# Patient Record
Sex: Male | Born: 1940
Health system: Southern US, Community
[De-identification: ages and names within clinical notes are randomized; demographics above are authoritative.]

## PROBLEM LIST (undated history)

## (undated) ENCOUNTER — Emergency Department (HOSPITAL_COMMUNITY): Discharge status: Absent without leave | Payer: Medicare Other

## (undated) DIAGNOSIS — H34239 Retinal artery branch occlusion, unspecified eye: Secondary | ICD-10-CM

## (undated) DIAGNOSIS — I4819 Other persistent atrial fibrillation: Secondary | ICD-10-CM

## (undated) DIAGNOSIS — C3490 Malignant neoplasm of unspecified part of unspecified bronchus or lung: Secondary | ICD-10-CM

## (undated) DIAGNOSIS — M1990 Unspecified osteoarthritis, unspecified site: Secondary | ICD-10-CM

## (undated) DIAGNOSIS — D126 Benign neoplasm of colon, unspecified: Secondary | ICD-10-CM

## (undated) DIAGNOSIS — I639 Cerebral infarction, unspecified: Secondary | ICD-10-CM

## (undated) DIAGNOSIS — I10 Essential (primary) hypertension: Secondary | ICD-10-CM

## (undated) DIAGNOSIS — R002 Palpitations: Secondary | ICD-10-CM

## (undated) DIAGNOSIS — K648 Other hemorrhoids: Secondary | ICD-10-CM

## (undated) DIAGNOSIS — K219 Gastro-esophageal reflux disease without esophagitis: Secondary | ICD-10-CM

## (undated) DIAGNOSIS — J45909 Unspecified asthma, uncomplicated: Secondary | ICD-10-CM

## (undated) DIAGNOSIS — K222 Esophageal obstruction: Secondary | ICD-10-CM

## (undated) DIAGNOSIS — E7800 Pure hypercholesterolemia, unspecified: Secondary | ICD-10-CM

## (undated) DIAGNOSIS — K449 Diaphragmatic hernia without obstruction or gangrene: Secondary | ICD-10-CM

## (undated) DIAGNOSIS — T7840XA Allergy, unspecified, initial encounter: Secondary | ICD-10-CM

## (undated) DIAGNOSIS — C4490 Unspecified malignant neoplasm of skin, unspecified: Secondary | ICD-10-CM

## (undated) DIAGNOSIS — Z950 Presence of cardiac pacemaker: Secondary | ICD-10-CM

## (undated) DIAGNOSIS — J40 Bronchitis, not specified as acute or chronic: Secondary | ICD-10-CM

## (undated) DIAGNOSIS — I441 Atrioventricular block, second degree: Secondary | ICD-10-CM

## (undated) HISTORY — DX: Hypercalcemia: E83.52

## (undated) HISTORY — PX: ESOPHAGOGASTRODUODENOSCOPY (EGD) WITH ESOPHAGEAL DILATION: SHX5812

## (undated) HISTORY — PX: KNEE ARTHROSCOPY: SHX127

## (undated) HISTORY — DX: Esophageal obstruction: K22.2

## (undated) HISTORY — DX: Malignant neoplasm of unspecified part of unspecified bronchus or lung: C34.90

## (undated) HISTORY — PX: MOHS SURGERY: SUR867

## (undated) HISTORY — PX: COLONOSCOPY: SHX174

## (undated) HISTORY — DX: Unspecified osteoarthritis, unspecified site: M19.90

## (undated) HISTORY — DX: Essential (primary) hypertension: I10

## (undated) HISTORY — DX: Other hemorrhoids: K64.8

## (undated) HISTORY — DX: Unspecified malignant neoplasm of skin, unspecified: C44.90

## (undated) HISTORY — DX: Hemochromatosis, unspecified: E83.119

## (undated) HISTORY — PX: UPPER GASTROINTESTINAL ENDOSCOPY: SHX188

## (undated) HISTORY — PX: INGUINAL HERNIA REPAIR: SUR1180

## (undated) HISTORY — DX: Unspecified asthma, uncomplicated: J45.909

## (undated) HISTORY — DX: Pure hypercholesterolemia, unspecified: E78.00

## (undated) HISTORY — DX: Diaphragmatic hernia without obstruction or gangrene: K44.9

## (undated) HISTORY — DX: Bronchitis, not specified as acute or chronic: J40

## (undated) HISTORY — DX: Allergy, unspecified, initial encounter: T78.40XA

## (undated) HISTORY — DX: Other persistent atrial fibrillation: I48.19

## (undated) HISTORY — DX: Palpitations: R00.2

## (undated) HISTORY — DX: Benign neoplasm of colon, unspecified: D12.6

---
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
## 1998-04-04 ENCOUNTER — Ambulatory Visit: Admission: RE | Admit: 1998-04-04 | Discharge: 1998-04-04 | Payer: Self-pay | Admitting: Internal Medicine

## 1998-05-02 ENCOUNTER — Encounter (HOSPITAL_COMMUNITY): Admission: RE | Admit: 1998-05-02 | Discharge: 1998-07-31 | Payer: Self-pay | Admitting: Internal Medicine

## 1998-10-07 ENCOUNTER — Encounter (HOSPITAL_COMMUNITY): Admission: RE | Admit: 1998-10-07 | Discharge: 1999-01-05 | Payer: Self-pay | Admitting: Internal Medicine

## 1999-03-04 ENCOUNTER — Encounter (HOSPITAL_COMMUNITY): Admission: RE | Admit: 1999-03-04 | Discharge: 1999-06-02 | Payer: Self-pay | Admitting: Internal Medicine

## 1999-06-03 ENCOUNTER — Encounter (HOSPITAL_COMMUNITY): Admission: RE | Admit: 1999-06-03 | Discharge: 1999-09-01 | Payer: Self-pay | Admitting: Internal Medicine

## 1999-10-23 ENCOUNTER — Encounter (HOSPITAL_COMMUNITY): Admission: RE | Admit: 1999-10-23 | Discharge: 2000-01-21 | Payer: Self-pay | Admitting: Internal Medicine

## 2003-04-21 ENCOUNTER — Emergency Department (HOSPITAL_COMMUNITY): Admission: EM | Admit: 2003-04-21 | Discharge: 2003-04-21 | Payer: Self-pay | Admitting: Emergency Medicine

## 2005-05-28 ENCOUNTER — Ambulatory Visit: Payer: Self-pay | Admitting: Gastroenterology

## 2005-06-17 ENCOUNTER — Ambulatory Visit: Payer: Self-pay | Admitting: Gastroenterology

## 2005-06-19 ENCOUNTER — Encounter (INDEPENDENT_AMBULATORY_CARE_PROVIDER_SITE_OTHER): Payer: Self-pay | Admitting: *Deleted

## 2005-06-19 ENCOUNTER — Ambulatory Visit: Payer: Self-pay | Admitting: Gastroenterology

## 2005-06-19 DIAGNOSIS — D126 Benign neoplasm of colon, unspecified: Secondary | ICD-10-CM | POA: Insufficient documentation

## 2005-07-21 ENCOUNTER — Ambulatory Visit: Payer: Self-pay | Admitting: Gastroenterology

## 2005-09-02 ENCOUNTER — Encounter (INDEPENDENT_AMBULATORY_CARE_PROVIDER_SITE_OTHER): Payer: Self-pay | Admitting: *Deleted

## 2005-09-02 ENCOUNTER — Ambulatory Visit (HOSPITAL_BASED_OUTPATIENT_CLINIC_OR_DEPARTMENT_OTHER): Admission: RE | Admit: 2005-09-02 | Discharge: 2005-09-02 | Payer: Self-pay | Admitting: Orthopedic Surgery

## 2007-06-14 ENCOUNTER — Encounter: Payer: Self-pay | Admitting: Internal Medicine

## 2007-06-14 ENCOUNTER — Ambulatory Visit (HOSPITAL_COMMUNITY): Admission: EM | Admit: 2007-06-14 | Discharge: 2007-06-15 | Payer: Self-pay | Admitting: Emergency Medicine

## 2007-06-14 DIAGNOSIS — K222 Esophageal obstruction: Secondary | ICD-10-CM | POA: Insufficient documentation

## 2007-06-23 ENCOUNTER — Ambulatory Visit: Payer: Self-pay | Admitting: Internal Medicine

## 2007-07-08 ENCOUNTER — Ambulatory Visit: Payer: Self-pay | Admitting: Gastroenterology

## 2007-08-20 DIAGNOSIS — IMO0002 Reserved for concepts with insufficient information to code with codable children: Secondary | ICD-10-CM

## 2007-08-20 DIAGNOSIS — M1710 Unilateral primary osteoarthritis, unspecified knee: Secondary | ICD-10-CM | POA: Insufficient documentation

## 2007-08-20 DIAGNOSIS — E7800 Pure hypercholesterolemia, unspecified: Secondary | ICD-10-CM

## 2007-08-20 DIAGNOSIS — N200 Calculus of kidney: Secondary | ICD-10-CM | POA: Insufficient documentation

## 2007-08-20 DIAGNOSIS — I10 Essential (primary) hypertension: Secondary | ICD-10-CM | POA: Insufficient documentation

## 2007-08-20 DIAGNOSIS — R001 Bradycardia, unspecified: Secondary | ICD-10-CM | POA: Insufficient documentation

## 2007-08-20 DIAGNOSIS — D509 Iron deficiency anemia, unspecified: Secondary | ICD-10-CM | POA: Insufficient documentation

## 2008-07-02 ENCOUNTER — Telehealth: Payer: Self-pay | Admitting: Gastroenterology

## 2008-07-04 ENCOUNTER — Encounter: Payer: Self-pay | Admitting: Gastroenterology

## 2008-08-06 ENCOUNTER — Ambulatory Visit: Payer: Self-pay | Admitting: Gastroenterology

## 2008-08-06 ENCOUNTER — Encounter: Payer: Self-pay | Admitting: Gastroenterology

## 2008-08-08 ENCOUNTER — Encounter: Payer: Self-pay | Admitting: Gastroenterology

## 2009-02-25 ENCOUNTER — Ambulatory Visit: Payer: Self-pay | Admitting: Surgery

## 2010-08-03 HISTORY — PX: POSTERIOR TIBIAL TENDON REPAIR: SHX6039

## 2010-08-05 ENCOUNTER — Encounter
Admission: RE | Admit: 2010-08-05 | Discharge: 2010-08-05 | Payer: Self-pay | Source: Home / Self Care | Attending: Internal Medicine | Admitting: Internal Medicine

## 2010-09-02 NOTE — Letter (Signed)
Summary: Generic Letter  [facility]  [address] [place], [place] [address]   Phone: [phone]  Fax: [phone]    07/04/2008  GUISEPPE FLANAGAN [address] 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 If you are interested in   scheduling the procedures still please give our office a call me at,   [phone].       Sincerely,   Harlow Mares [facility]

## 2010-09-02 NOTE — Letter (Signed)
Summary: Patient Notice-Endo Biopsy Results  [facility]  [address] [place], [place] [address]   Phone: [phone]  Fax: [phone]        August 08, 2008 MRN: [id number]    Alexander Murray [address] [place], [place]  [address]    Dear Alexander Murray,  I am pleased to inform you that the biopsies taken during your recent endoscopic examination did not show any evidence of cancer upon pathologic examination.The biopsies did not show Barrett's mucosa, and this stricture in the cervical esophagus is benign and we will dilate as needed....DRP  Additional information/recommendations:  __No further action is needed at this time.  Please follow-up with      your primary care physician for your other healthcare needs.  __ Please call [phone] to schedule a return visit to review      your condition.  _xx_ Continue with the treatment plan as outlined on the day of your      exam.  __ You should have a repeat endoscopic examination for thi_s problem              in _ months/years.   Please call us if you are having persistent problems or have questions about your condition that have not been fully answered at this time.  Sincerely,  Mardella Layman MD [facility]  This letter has been electronically signed by your physician.

## 2010-09-02 NOTE — Progress Notes (Signed)
Summary: sch appt.  Phone Note Outgoing Call   Call placed by: Harlow Mares CMA,  July 02, 2008 12:57 PM Summary of Call: needs colon/egd Initial call taken by: Harlow Mares CMA,  July 02, 2008 12:58 PM  Follow-up for Phone Call        Left message for patient to call. Hortense Ramal CMA  July 03, 2008 10:18 AM Left message on patients machine to call back, to schedule egd and colon and if he would like to have the procedures.  also mailed letter Follow-up by: Harlow Mares CMA,  July 04, 2008 12:21 PM      Appended Document: Prep for Colon    Clinical Lists Changes  Medications: Added new medication of MOVIPREP 100 GM  SOLR (PEG-KCL-NACL-NASULF-NA ASC-C) As per prep instructions. - Signed Rx of MOVIPREP 100 GM  SOLR (PEG-KCL-NACL-NASULF-NA ASC-C) As per prep instructions.;  #1 x 0;  Signed;  Entered by: Harlow Mares CMA;  Authorized by: Mardella Layman MD [facility];  Method used: Electronically to [facility] [address] [id number]*, [address] [place], [place], [place]  [address], Ph: [phone] or [phone], Fax: [phone]    Prescriptions: MOVIPREP 100 GM  SOLR (PEG-KCL-NACL-NASULF-NA ASC-C) As per prep instructions.  #1 x 0   Entered by:   Harlow Mares CMA   Authorized by:   Mardella Layman MD FACG,FAGA   Signed by:   Harlow Mares CMA on 07/13/2008   Method used:   Electronically to        [facility] [address] [phone]* (retail)       [address]       [place], [place]  [address]       Ph: [phone] or [phone]       Fax: [phone]   RxID:   [id number]

## 2010-09-02 NOTE — Procedures (Signed)
Summary: Gastroenterology colon  Gastroenterology colon   Imported By: Lorre Munroe RN 08/22/2007 15:55:23  _____________________________________________________________________  External Attachment:    Type:   Image     Comment:   External Document

## 2010-09-02 NOTE — Procedures (Signed)
Summary: Gastroenterology EGD  Gastroenterology EGD   Imported By: Lorre Munroe RN 08/22/2007 15:56:21  _____________________________________________________________________  External Attachment:    Type:   Image     Comment:   External Document

## 2010-09-02 NOTE — Procedures (Signed)
Summary: EGD   EGD  Procedure date:  08/06/2008  Findings:      Location: [facility]    ENDOSCOPY PROCEDURE REPORT  PATIENT:  Alexander, Murray  MR#:  [id number] BIRTHDATE:   November 10, 1940   GENDER:   male  ENDOSCOPIST:   Vania Rea. Jarold Motto, MD, [facility] Graham ASSISTANT:    PROCEDURE DATE:  08/06/2008 PROCEDURE:  EGD with biopsy, EGD with balloon dilatation ASA CLASS:   Class II INDICATIONS: 1) dysphagia   MEDICATIONS:    Fentanyl 50 mcg IV, Versed 2 mg IV TOPICAL ANESTHETIC:   Exactacain Spray  DESCRIPTION OF PROCEDURE:   After the risks benefits and alternatives of the procedure were thoroughly explained, informed consent was obtained.  The LB GIF-H180 [id number] endoscope was introduced through the mouth and advanced to the second portion of the duodenum, without limitations.  The instrument was slowly withdrawn as the mucosa was carefully examined. <<PROCEDUREIMAGES>>          <<OLD IMAGES>>  A stricture was found in the proximal esophagus. Balloon dilatation over a guidewire was performed.  Barrett's esophagus was found in the proximal esophagus. BIOPSIES DONE    Dilation was then performed at the proximal esophagus  1) Dilator:   Balloon   Size(s):   Resistance:   minimal   Heme:   yes Appearance:   adequate   COMPLICATIONS:   None  ENDOSCOPIC IMPRESSION:  1) Stricture in the proximal esophagus  2) Barrett's esophagus in the proximal esophagus RECOMMENDATIONS:  1) await pathology results  2) Repeat dilation as needed.  REPEAT EXAM:   No   _______________________________ Vania Rea. Jarold Motto, MD, Clement[facility] Graham   CC: Jarome Matin, M.D.       REPORT OF SURGICAL PATHOLOGY   Case #: [id number] Patient Name: Alexander Murray, BRIGHTBILL MD Office Chart Number:  [id number]   MRN: [id number] Pathologist: Beulah Gandy. Luisa Hart, MD DOB/Age  Aug 22, 1940 (Age: 70)    Gender: M Date Taken:  08/06/2008 Date Received: 08/07/2008   FINAL DIAGNOSIS   ***MICROSCOPIC EXAMINATION AND  DIAGNOSIS***   ESOPHAGUS, BIOPSIES:   - BENIGN INFLAMED GASTROESOPHAGEAL JUNCTION MUCOSA.   - NO INTESTINAL METAPLASIA, DYSPLASIA OR MALIGNANCY IDENTIFIED.    COMMENT An Alcian Blue stain is performed to determine the presence of intestinal metaplasia (goblet cell metaplasia). No intestinal metaplasia (goblet cell metaplasia) is identified with the Alcian Blue stain. The control stained appropriately.   cc Date Reported:  08/08/2008     Beulah Gandy. Luisa Hart, MD *** Electronically Signed Out By JDP ***  August 08, 2008 MRN: [id number]    [facility] [address] [place], [place]  [address]    Dear Alexander Murray,  I am pleased to inform you that the biopsies taken during your recent endoscopic examination did not show any evidence of cancer upon pathologic examination.The biopsies did not show Barrett's mucosa, and this stricture in the cervical esophagus is benign and we will dilate as needed....DRP  Additional information/recommendations:  __No further action is needed at this time.  Please follow-up with      your primary care physician for your other healthcare needs.  __ Please call [phone] to schedule a return visit to review      your condition.  _xx_ Continue with the treatment plan as outlined on the day of your      exam.  __ You should have a repeat endoscopic examination for thi_s problem              in _ months/years.  Please call us if you are having persistent problems or have questions about your condition that have not been fully answered at this time.  Sincerely,  Mardella Layman MD [facility]  This letter has been electronically signed by your physician.

## 2010-09-02 NOTE — Procedures (Signed)
Summary: Colonoscopy   Colonoscopy  Procedure date:  08/06/2008  Findings:      Location:  [facility].    Procedures Next Due Date:    Colonoscopy: 08/2013  COLONOSCOPY PROCEDURE REPORT  PATIENT:  Alexander Murray, Alexander Murray  MR#:  [id number] BIRTHDATE:   1940-08-10   GENDER:   male  ENDOSCOPIST:   Vania Rea. Jarold Motto, MD, [facility] Referred by: Jarome Matin, M.D.  PROCEDURE DATE:  08/06/2008 PROCEDURE:  Colonoscopy with snare polypectomy ASA CLASS:   Class II INDICATIONS: surveillance, history of polyps, family history of colon cancer BOTH PARENTS WITH COLON CA.  MEDICATIONS:    Fentanyl 50 mcg IV, Versed 8 mg IV  DESCRIPTION OF PROCEDURE:   After the risks benefits and alternatives of the procedure were thoroughly explained, informed consent was obtained.  Digital rectal exam was performed and revealed no abnormalities.   The LB CF-H180AL [id number] endoscope was introduced through the anus and advanced to the cecum, which was identified by both the appendix and ileocecal valve, without limitations.  The quality of the prep was excellent, using MoviPrep.  The instrument was then slowly withdrawn as the colon was fully examined. <<PROCEDUREIMAGES>>      <<OLD IMAGES>>  FINDINGS:  There were multiple polyps identified and removed. transverse to sigmoid They were diminutive. Polyps were snared without cautery. Retrieval was unsuccessful. snare polyp SMALL DIMINUTIVE POLYPS COLD SNAE EXCISED.  Mild diverticulosis was found sigmoid to descending   Retroflexed views in the rectum revealed no abnormalities.    The scope was then withdrawn from the patient and the procedure completed.  COMPLICATIONS:   None  ENDOSCOPIC IMPRESSION:  1) Polyps, multiple in the transverse to sigmoid  2) Mild diverticulosis in the sigmoid to descending  3) Normal colonoscopy otherwise RECOMMENDATIONS:  1) If the polyp(s) removed today are proven to be adenomatous (pre-cancerous) polyps, the patient  will need a colonoscopy in 5 years. Otherwise they should continue to follow colorectal cancer screening guidelines for "routine risk" patients with colonoscopy in 10 years.  REPEAT EXAM:   No   _______________________________ Vania Rea. Jarold Motto, MD, Clement[facility] Graham  CC: Jarome Matin, MD     This report was created from the original endoscopy report, which was reviewed and signed by the above listed endoscopist.

## 2010-12-16 NOTE — Procedures (Signed)
CAROTID DUPLEX EXAM   INDICATION:  Partial right retinal occlusion.   HISTORY:  Diabetes:  No  Cardiac:  PVC  Hypertension:  Yes  Smoking:  Quit about 40 years ago.  Please see note from below.  Previous Surgery:  CV History:  Amaurosis Fugax, Paresthesias, Hemiparesis                                       RIGHT             LEFT  Brachial systolic pressure:         Around 140        128  Brachial Doppler waveforms:         Biphasic          Biphasic  Vertebral direction of flow:        Antegrade         Antegrade  DUPLEX VELOCITIES (cm/sec)  CCA peak systolic                   89                101  ECA peak systolic                   131               90  ICA peak systolic                   91                75  ICA end diastolic                   21                17  PLAQUE MORPHOLOGY:                    HETERO          HETERO  PLAQUE AMOUNT:                      Mild              Mild  PLAQUE LOCATION:                    CCA, ICA and ECA  CCA, ICA and ECA   IMPRESSION:  A 1-39% stenosis noted in bilateral ICAs.   Antegrade bilateral vertebral arteries.   ___________________________________________  V. Charlena Cross, MD   MG/MEDQ  D:  02/25/2009  T:  02/26/2009  Job:  [id number]

## 2010-12-16 NOTE — Assessment & Plan Note (Signed)
 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  He  is followed by Dr. Dossie Arbour and recently had blood work, which he  will forward to [place].   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  Lina Sar.  He has done well since that time and denies any  gastrointestinal complaints.  He has no chronic GERD symptoms or other  GI issues.  He does have a long history of recurrent colon polyps and is  due for followup colonoscopy in November.   Vital signs today are all normal.  He weighs 181 pounds and blood  pressure 112/70.  Pulse was slightly low at 46 and the patient is on  Cardura 4 mg twice a day, per Dr. Eloise Harman.   General physical exam was not performed.   ASSESSMENT:  1. Cervical web with recurrent dysphagia.  He is asymptomatic at this      point.  2. Chronic recurrent colon polyps with family history of colon cancer.  3. Bradycardia with a pulse rate of 46, but he is asymptomatic.   RECOMMENDATIONS:  1. Review lab data from Dr. Jarome Matin.  2. He is due for colonoscopy followup in November of 2009.  We will      proceed with endoscopy and biopsy of his cervical web at that time.  3. Letter to Dr. Eloise Harman concerning his bradycardia.     Vania Rea. Jarold Motto, MD, [facility] Essex, FAGA  Electronically Signed    DRP/MedQ  DD: 07/08/2007  DT: 07/08/2007  Job #: [id number]

## 2010-12-19 NOTE — Op Note (Signed)
NAME:  Alexander Murray, Alexander Murray              ACCOUNT NO.:  [id number]   MEDICAL RECORD NO.:  [id number]          PATIENT TYPE:  AMB   LOCATION:  [facility]                          FACILITY:  [facility]   PHYSICIAN:  Leonides Grills, M.D.     DATE OF BIRTH:  30-Aug-1940   DATE OF PROCEDURE:  09/02/2005  DATE OF DISCHARGE:                                 OPERATIVE REPORT   PREOPERATIVE DIAGNOSIS:  Right third web space Morton's neuroma.   POSTOPERATIVE DIAGNOSIS:  Right third web space Morton's neuroma.   OPERATION PERFORMED:  Excision of right third web space, Morton's neuroma.   SURGEON:  Leonides Grills, M.D.   ASSISTANT:  Lianne Cure, P.A.   ANESTHESIA:  General with ankle block.   ESTIMATED BLOOD LOSS:  Minimal.   TOURNIQUET TIME:  Approximately 20 minutes.   COMPLICATIONS:  None.   DISPOSITION:  Stable to PR.   INDICATIONS FOR PROCEDURE:  The patient is a 70 year old gentleman who has  had longstanding right forefoot pain that was interfering with his life to  the point where he cannot do what he wants to do.  He has consented for the  above procedure.  All risks which include infection, neurovascular injury,  persistent pain, worsening pain, prolonged recovery, hematoma formation were  all explained, questions were encouraged and answered.   DESCRIPTION OF PROCEDURE:  The patient was brought to the operating room and  placed in supine position after adequate general anesthesia with ankle block  was administered as well as Ancef 1 g IV piggyback.  The right lower  extremity was then prepped and draped in sterile manner over a proximally  placed thigh tourniquet.  The limb was gravity exsanguinated.  Tourniquet  was elevated to 290 mmHg.  Longitudinal incision over the dorsal aspect of  the right third web space forefoot was then made.  Dissection was carried  down through skin and hemostasis was obtained.  Dissection was carried down  between the third and fourth metatarsal heads.  The  metatarsal head spreader  was applied.  The intermetatarsal ligament was then transected and the  neuroma was then identified.  This was then dissected out, transected  distally and dissected proximally into the muscular part of the foot and cut  approximately 2 cm proximal to the metatarsal head in the muscular part of  the foot.  This was sent to pathology.  Tourniquet was  deflated, hemostasis was obtained.  The area was then copiously irrigated  with normal saline.  Subcutaneous was closed with 3-0 Vicryl.  The skin was  closed with 4-0 nylon.  Sterile dressing was applied.  Hard sole shoe was  applied.  The patient was stable to the PR.      Leonides Grills, M.D.  Electronically Signed     PB/MEDQ  D:  09/02/2005  T:  09/02/2005  Job:  [id number]

## 2011-08-06 DIAGNOSIS — Z1212 Encounter for screening for malignant neoplasm of rectum: Secondary | ICD-10-CM | POA: Diagnosis not present

## 2011-08-06 DIAGNOSIS — Z0000 Encounter for general adult medical examination without abnormal findings: Secondary | ICD-10-CM | POA: Diagnosis not present

## 2011-08-06 DIAGNOSIS — Z125 Encounter for screening for malignant neoplasm of prostate: Secondary | ICD-10-CM | POA: Diagnosis not present

## 2011-08-06 DIAGNOSIS — E782 Mixed hyperlipidemia: Secondary | ICD-10-CM | POA: Diagnosis not present

## 2011-08-06 DIAGNOSIS — I10 Essential (primary) hypertension: Secondary | ICD-10-CM | POA: Diagnosis not present

## 2011-08-14 DIAGNOSIS — M2140 Flat foot [pes planus] (acquired), unspecified foot: Secondary | ICD-10-CM | POA: Diagnosis not present

## 2011-08-14 DIAGNOSIS — M25579 Pain in unspecified ankle and joints of unspecified foot: Secondary | ICD-10-CM | POA: Diagnosis not present

## 2011-08-17 ENCOUNTER — Other Ambulatory Visit: Payer: Self-pay | Admitting: Dermatology

## 2011-08-17 DIAGNOSIS — C44319 Basal cell carcinoma of skin of other parts of face: Secondary | ICD-10-CM | POA: Diagnosis not present

## 2011-08-17 DIAGNOSIS — C44320 Squamous cell carcinoma of skin of unspecified parts of face: Secondary | ICD-10-CM | POA: Diagnosis not present

## 2011-09-28 DIAGNOSIS — C44320 Squamous cell carcinoma of skin of unspecified parts of face: Secondary | ICD-10-CM | POA: Diagnosis not present

## 2011-10-09 DIAGNOSIS — Z01818 Encounter for other preprocedural examination: Secondary | ICD-10-CM | POA: Diagnosis not present

## 2011-10-09 DIAGNOSIS — Z79899 Other long term (current) drug therapy: Secondary | ICD-10-CM | POA: Diagnosis not present

## 2011-10-19 DIAGNOSIS — M25579 Pain in unspecified ankle and joints of unspecified foot: Secondary | ICD-10-CM | POA: Diagnosis not present

## 2011-10-19 DIAGNOSIS — Z87891 Personal history of nicotine dependence: Secondary | ICD-10-CM | POA: Diagnosis not present

## 2011-10-19 DIAGNOSIS — G8918 Other acute postprocedural pain: Secondary | ICD-10-CM | POA: Diagnosis not present

## 2011-10-19 DIAGNOSIS — M2140 Flat foot [pes planus] (acquired), unspecified foot: Secondary | ICD-10-CM | POA: Diagnosis not present

## 2011-10-19 DIAGNOSIS — E7800 Pure hypercholesterolemia, unspecified: Secondary | ICD-10-CM | POA: Diagnosis not present

## 2011-10-19 DIAGNOSIS — M1990 Unspecified osteoarthritis, unspecified site: Secondary | ICD-10-CM | POA: Diagnosis not present

## 2011-10-19 DIAGNOSIS — M719 Bursopathy, unspecified: Secondary | ICD-10-CM | POA: Diagnosis not present

## 2011-10-19 DIAGNOSIS — Z79899 Other long term (current) drug therapy: Secondary | ICD-10-CM | POA: Diagnosis not present

## 2011-10-19 DIAGNOSIS — M6790 Unspecified disorder of synovium and tendon, unspecified site: Secondary | ICD-10-CM | POA: Diagnosis not present

## 2011-10-19 DIAGNOSIS — M76829 Posterior tibial tendinitis, unspecified leg: Secondary | ICD-10-CM | POA: Diagnosis not present

## 2011-10-19 DIAGNOSIS — I10 Essential (primary) hypertension: Secondary | ICD-10-CM | POA: Diagnosis not present

## 2011-10-20 DIAGNOSIS — M6790 Unspecified disorder of synovium and tendon, unspecified site: Secondary | ICD-10-CM | POA: Diagnosis not present

## 2011-10-20 DIAGNOSIS — M1990 Unspecified osteoarthritis, unspecified site: Secondary | ICD-10-CM | POA: Diagnosis not present

## 2011-10-20 DIAGNOSIS — E7800 Pure hypercholesterolemia, unspecified: Secondary | ICD-10-CM | POA: Diagnosis not present

## 2011-10-20 DIAGNOSIS — M719 Bursopathy, unspecified: Secondary | ICD-10-CM | POA: Diagnosis not present

## 2011-10-20 DIAGNOSIS — I10 Essential (primary) hypertension: Secondary | ICD-10-CM | POA: Diagnosis not present

## 2011-10-20 DIAGNOSIS — Z87891 Personal history of nicotine dependence: Secondary | ICD-10-CM | POA: Diagnosis not present

## 2011-10-20 DIAGNOSIS — M2140 Flat foot [pes planus] (acquired), unspecified foot: Secondary | ICD-10-CM | POA: Diagnosis not present

## 2011-11-03 DIAGNOSIS — M76829 Posterior tibial tendinitis, unspecified leg: Secondary | ICD-10-CM | POA: Diagnosis not present

## 2011-11-24 DIAGNOSIS — M76829 Posterior tibial tendinitis, unspecified leg: Secondary | ICD-10-CM | POA: Diagnosis not present

## 2011-11-24 DIAGNOSIS — M25579 Pain in unspecified ankle and joints of unspecified foot: Secondary | ICD-10-CM | POA: Diagnosis not present

## 2011-11-24 DIAGNOSIS — M2140 Flat foot [pes planus] (acquired), unspecified foot: Secondary | ICD-10-CM | POA: Diagnosis not present

## 2011-12-01 DIAGNOSIS — M76829 Posterior tibial tendinitis, unspecified leg: Secondary | ICD-10-CM | POA: Diagnosis not present

## 2011-12-03 DIAGNOSIS — M76829 Posterior tibial tendinitis, unspecified leg: Secondary | ICD-10-CM | POA: Diagnosis not present

## 2011-12-08 DIAGNOSIS — H5200 Hypermetropia, unspecified eye: Secondary | ICD-10-CM | POA: Diagnosis not present

## 2011-12-08 DIAGNOSIS — H2510 Age-related nuclear cataract, unspecified eye: Secondary | ICD-10-CM | POA: Diagnosis not present

## 2011-12-08 DIAGNOSIS — M76829 Posterior tibial tendinitis, unspecified leg: Secondary | ICD-10-CM | POA: Diagnosis not present

## 2011-12-08 DIAGNOSIS — H349 Unspecified retinal vascular occlusion: Secondary | ICD-10-CM | POA: Diagnosis not present

## 2011-12-08 DIAGNOSIS — H52209 Unspecified astigmatism, unspecified eye: Secondary | ICD-10-CM | POA: Diagnosis not present

## 2011-12-10 DIAGNOSIS — M76829 Posterior tibial tendinitis, unspecified leg: Secondary | ICD-10-CM | POA: Diagnosis not present

## 2011-12-14 DIAGNOSIS — M76829 Posterior tibial tendinitis, unspecified leg: Secondary | ICD-10-CM | POA: Diagnosis not present

## 2011-12-17 DIAGNOSIS — M76829 Posterior tibial tendinitis, unspecified leg: Secondary | ICD-10-CM | POA: Diagnosis not present

## 2011-12-18 DIAGNOSIS — M76829 Posterior tibial tendinitis, unspecified leg: Secondary | ICD-10-CM | POA: Diagnosis not present

## 2011-12-25 ENCOUNTER — Encounter: Payer: Self-pay | Admitting: *Deleted

## 2012-04-18 DIAGNOSIS — D692 Other nonthrombocytopenic purpura: Secondary | ICD-10-CM | POA: Diagnosis not present

## 2012-04-18 DIAGNOSIS — L578 Other skin changes due to chronic exposure to nonionizing radiation: Secondary | ICD-10-CM | POA: Diagnosis not present

## 2012-04-18 DIAGNOSIS — L570 Actinic keratosis: Secondary | ICD-10-CM | POA: Diagnosis not present

## 2012-04-18 DIAGNOSIS — L820 Inflamed seborrheic keratosis: Secondary | ICD-10-CM | POA: Diagnosis not present

## 2012-04-18 DIAGNOSIS — Z85828 Personal history of other malignant neoplasm of skin: Secondary | ICD-10-CM | POA: Diagnosis not present

## 2012-05-17 DIAGNOSIS — Z23 Encounter for immunization: Secondary | ICD-10-CM | POA: Diagnosis not present

## 2012-06-06 ENCOUNTER — Encounter: Payer: Self-pay | Admitting: Cardiology

## 2012-08-04 DIAGNOSIS — Z862 Personal history of diseases of the blood and blood-forming organs and certain disorders involving the immune mechanism: Secondary | ICD-10-CM | POA: Diagnosis not present

## 2012-08-04 DIAGNOSIS — Z79899 Other long term (current) drug therapy: Secondary | ICD-10-CM | POA: Diagnosis not present

## 2012-08-04 DIAGNOSIS — Z125 Encounter for screening for malignant neoplasm of prostate: Secondary | ICD-10-CM | POA: Diagnosis not present

## 2012-08-04 DIAGNOSIS — E782 Mixed hyperlipidemia: Secondary | ICD-10-CM | POA: Diagnosis not present

## 2012-08-04 DIAGNOSIS — I10 Essential (primary) hypertension: Secondary | ICD-10-CM | POA: Diagnosis not present

## 2012-08-08 DIAGNOSIS — Z0000 Encounter for general adult medical examination without abnormal findings: Secondary | ICD-10-CM | POA: Diagnosis not present

## 2012-08-08 DIAGNOSIS — Z79899 Other long term (current) drug therapy: Secondary | ICD-10-CM | POA: Diagnosis not present

## 2012-08-08 DIAGNOSIS — Z1212 Encounter for screening for malignant neoplasm of rectum: Secondary | ICD-10-CM | POA: Diagnosis not present

## 2012-08-08 DIAGNOSIS — E782 Mixed hyperlipidemia: Secondary | ICD-10-CM | POA: Diagnosis not present

## 2012-08-08 DIAGNOSIS — Z125 Encounter for screening for malignant neoplasm of prostate: Secondary | ICD-10-CM | POA: Diagnosis not present

## 2012-08-08 DIAGNOSIS — I10 Essential (primary) hypertension: Secondary | ICD-10-CM | POA: Diagnosis not present

## 2012-08-15 ENCOUNTER — Encounter: Payer: Self-pay | Admitting: Cardiology

## 2012-10-03 ENCOUNTER — Other Ambulatory Visit: Payer: Self-pay | Admitting: Dermatology

## 2012-10-03 DIAGNOSIS — D485 Neoplasm of uncertain behavior of skin: Secondary | ICD-10-CM | POA: Diagnosis not present

## 2012-10-03 DIAGNOSIS — L820 Inflamed seborrheic keratosis: Secondary | ICD-10-CM | POA: Diagnosis not present

## 2012-10-03 DIAGNOSIS — Z85828 Personal history of other malignant neoplasm of skin: Secondary | ICD-10-CM | POA: Diagnosis not present

## 2012-10-03 DIAGNOSIS — L578 Other skin changes due to chronic exposure to nonionizing radiation: Secondary | ICD-10-CM | POA: Diagnosis not present

## 2012-11-11 ENCOUNTER — Other Ambulatory Visit: Payer: Self-pay | Admitting: Dermatology

## 2012-11-11 DIAGNOSIS — L821 Other seborrheic keratosis: Secondary | ICD-10-CM | POA: Diagnosis not present

## 2012-11-11 DIAGNOSIS — L820 Inflamed seborrheic keratosis: Secondary | ICD-10-CM | POA: Diagnosis not present

## 2012-11-11 DIAGNOSIS — Z85828 Personal history of other malignant neoplasm of skin: Secondary | ICD-10-CM | POA: Diagnosis not present

## 2012-11-11 DIAGNOSIS — L570 Actinic keratosis: Secondary | ICD-10-CM | POA: Diagnosis not present

## 2012-11-11 DIAGNOSIS — D485 Neoplasm of uncertain behavior of skin: Secondary | ICD-10-CM | POA: Diagnosis not present

## 2012-11-11 DIAGNOSIS — L819 Disorder of pigmentation, unspecified: Secondary | ICD-10-CM | POA: Diagnosis not present

## 2012-12-08 ENCOUNTER — Other Ambulatory Visit: Payer: Self-pay | Admitting: Dermatology

## 2012-12-08 DIAGNOSIS — C4360 Malignant melanoma of unspecified upper limb, including shoulder: Secondary | ICD-10-CM | POA: Diagnosis not present

## 2012-12-08 DIAGNOSIS — C4359 Malignant melanoma of other part of trunk: Secondary | ICD-10-CM | POA: Diagnosis not present

## 2012-12-08 DIAGNOSIS — D485 Neoplasm of uncertain behavior of skin: Secondary | ICD-10-CM | POA: Diagnosis not present

## 2012-12-12 DIAGNOSIS — H35369 Drusen (degenerative) of macula, unspecified eye: Secondary | ICD-10-CM | POA: Diagnosis not present

## 2012-12-12 DIAGNOSIS — H2510 Age-related nuclear cataract, unspecified eye: Secondary | ICD-10-CM | POA: Diagnosis not present

## 2013-01-05 DIAGNOSIS — L259 Unspecified contact dermatitis, unspecified cause: Secondary | ICD-10-CM | POA: Diagnosis not present

## 2013-01-18 ENCOUNTER — Other Ambulatory Visit: Payer: Self-pay | Admitting: Dermatology

## 2013-01-18 DIAGNOSIS — D2360 Other benign neoplasm of skin of unspecified upper limb, including shoulder: Secondary | ICD-10-CM | POA: Diagnosis not present

## 2013-01-18 DIAGNOSIS — C4360 Malignant melanoma of unspecified upper limb, including shoulder: Secondary | ICD-10-CM | POA: Diagnosis not present

## 2013-01-18 DIAGNOSIS — Z85828 Personal history of other malignant neoplasm of skin: Secondary | ICD-10-CM | POA: Diagnosis not present

## 2013-01-18 DIAGNOSIS — C4359 Malignant melanoma of other part of trunk: Secondary | ICD-10-CM | POA: Diagnosis not present

## 2013-04-10 DIAGNOSIS — Z85820 Personal history of malignant melanoma of skin: Secondary | ICD-10-CM | POA: Diagnosis not present

## 2013-04-10 DIAGNOSIS — L821 Other seborrheic keratosis: Secondary | ICD-10-CM | POA: Diagnosis not present

## 2013-04-10 DIAGNOSIS — Z85828 Personal history of other malignant neoplasm of skin: Secondary | ICD-10-CM | POA: Diagnosis not present

## 2013-04-10 DIAGNOSIS — L820 Inflamed seborrheic keratosis: Secondary | ICD-10-CM | POA: Diagnosis not present

## 2013-04-10 DIAGNOSIS — D1801 Hemangioma of skin and subcutaneous tissue: Secondary | ICD-10-CM | POA: Diagnosis not present

## 2013-04-10 DIAGNOSIS — L570 Actinic keratosis: Secondary | ICD-10-CM | POA: Diagnosis not present

## 2013-04-10 DIAGNOSIS — D239 Other benign neoplasm of skin, unspecified: Secondary | ICD-10-CM | POA: Diagnosis not present

## 2013-05-12 DIAGNOSIS — Z23 Encounter for immunization: Secondary | ICD-10-CM | POA: Diagnosis not present

## 2013-06-16 ENCOUNTER — Encounter: Payer: Self-pay | Admitting: Gastroenterology

## 2013-06-19 DIAGNOSIS — L821 Other seborrheic keratosis: Secondary | ICD-10-CM | POA: Diagnosis not present

## 2013-06-19 DIAGNOSIS — Z85828 Personal history of other malignant neoplasm of skin: Secondary | ICD-10-CM | POA: Diagnosis not present

## 2013-06-21 ENCOUNTER — Encounter: Payer: Self-pay | Admitting: Physician Assistant

## 2013-06-26 ENCOUNTER — Encounter: Payer: Self-pay | Admitting: Physician Assistant

## 2013-06-26 ENCOUNTER — Ambulatory Visit (INDEPENDENT_AMBULATORY_CARE_PROVIDER_SITE_OTHER): Payer: Medicare Other | Admitting: Physician Assistant

## 2013-06-26 ENCOUNTER — Telehealth: Payer: Self-pay | Admitting: *Deleted

## 2013-06-26 VITALS — BP 130/66 | HR 46 | Wt 176.8 lb

## 2013-06-26 DIAGNOSIS — H3410 Central retinal artery occlusion, unspecified eye: Secondary | ICD-10-CM

## 2013-06-26 DIAGNOSIS — R1310 Dysphagia, unspecified: Secondary | ICD-10-CM

## 2013-06-26 DIAGNOSIS — Z7901 Long term (current) use of anticoagulants: Secondary | ICD-10-CM | POA: Diagnosis not present

## 2013-06-26 DIAGNOSIS — Z8719 Personal history of other diseases of the digestive system: Secondary | ICD-10-CM

## 2013-06-26 DIAGNOSIS — Z86010 Personal history of colonic polyps: Secondary | ICD-10-CM | POA: Diagnosis not present

## 2013-06-26 DIAGNOSIS — Z800 Family history of malignant neoplasm of digestive organs: Secondary | ICD-10-CM

## 2013-06-26 HISTORY — DX: Central retinal artery occlusion, unspecified eye: H34.10

## 2013-06-26 NOTE — Patient Instructions (Signed)
You have been scheduled for an endoscopy and colonoscopy with propofol. Please follow the written instructions given to you at your visit today. Please pick up your prep at the pharmacy within the next 1-3 days. If you use inhalers (even only as needed), please bring them with you on the day of your procedure. We will contact you once we hear from Dr. Jarome Matin regarding the Plavix medication.  We sent him a letter asking if he wants you to hold the Plavix for 5 days prior to the procedure.

## 2013-06-26 NOTE — Telephone Encounter (Signed)
Faxed anticoagulation letter to Dr. Jarome Matin today, 06-26-2013.

## 2013-06-26 NOTE — Progress Notes (Signed)
Subjective:    Patient ID: Alexander Murray, male    DOB: 1940-08-30, 72 y.o.   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 Jarold Motto from previous procedures. He has a family history of colon cancer in both of his parents and personal history of adenomatous colon polyps. He had last colonoscopy in January of 2010 with multiple diminutive polyps removed from the transverse to sigmoid colon, retrieval was unsuccessful.Marland Kitchen He also underwent upper endoscopy at that same time with finding of a stricture in the distal esophagus which was balloon dilated he was also noted to have changes of Barrett's. Biopsies were taken and showed no intestinal metaplasia dysplasia or malignancy. Patient is maintained on chronic Plavix for history of a retinal artery branch occlusion and proximally 4 years ago. He also has hypertension and hyperlipidemia. He has no current lower GI complaints, specifically no changes in his bowel habits abdominal pain melena or hematochezia. He does state that he has occasional dysphagia and says that he is altered his diet over the years so that he doesn't have regular problems with swallowing. He says he is aware of cutting his food in much smaller pieces and avoiding big pills. Patient's Plavix is managed by his primary care physician Dr. Jarome Matin.    Review of Systems  Constitutional: Negative.   HENT: Positive for trouble swallowing.   Eyes: Positive for visual disturbance.  Respiratory: Negative.   Cardiovascular: Negative.   Gastrointestinal: Negative.   Endocrine: Negative.   Genitourinary: Negative.   Musculoskeletal: Negative.   Allergic/Immunologic: Negative.   Neurological: Negative.   Hematological: Bruises/bleeds easily.  Psychiatric/Behavioral: Negative.    Outpatient Prescriptions Prior to Visit  Medication Sig Dispense Refill  . aspirin 81 MG tablet Take 81 mg by mouth daily.      . chlorthalidone (HYGROTON) 25 MG tablet  Take 25 mg by mouth every other day.       . doxazosin (CARDURA) 8 MG tablet Take 4 mg by mouth daily.       . pravastatin (PRAVACHOL) 20 MG tablet Take 20 mg by mouth daily.       No facility-administered medications prior to visit.   No Known Allergies Patient Active Problem List   Diagnosis Date Noted  . Central retinal artery occlusion 06/26/2013  . Chronic anticoagulation 06/26/2013  . Family hx of colon cancer 06/26/2013  . HYPERCHOLESTEROLEMIA 08/20/2007  . ANEMIA, IRON DEFICIENCY, CHRONIC 08/20/2007  . HYPERTENSION, BENIGN, MILD 08/20/2007  . BRADYCARDIA 08/20/2007  . RENAL CALCULUS, RECURRENT 08/20/2007  . ARTHRITIS, RIGHT KNEE 08/20/2007  . HEMOCHROMATOSIS 08/20/2007  . ESOPHAGEAL STRICTURE 06/14/2007  . COLONIC POLYPS 06/19/2005   History  Substance Use Topics  . Smoking status: Former Games developer  . Smokeless tobacco: Not on file  . Alcohol Use: No   family history includes Colon cancer in his father and mother.     Objective:   Physical Exam old old white male in no acute distress, pleasant blood pressure 130/66 pulse 46 weight 176. HEENT; nontraumatic normocephalic EOMI PERRLA sclera anicteric, Supple no JVD, Cardiovascula;r regular rate and rhythm with S1-S2 no murmur or gallop, Pulm; clear bilaterally, Abdomen ;soft nontender nondistended bowel sounds present, Rectal; exam not done, Psych ;mood and affect normal and appropriate        Assessment & Plan:  #34 72 year old male with personal history of adenomatous colon polyps and family history of colon cancer in both the patient's mother and father due for 5  year interval followup colonoscopy #2 history of esophageal stricture status post dilation 2010-patient with mild intermittent dysphagia #3 chronic antiplatelet therapy with Plavix and aspirin #4 history of retinal artery branch occlusion approximately 4 years ago #5 hypertension #6 hyperlipidemia  Plan; Patient will be scheduled for colonoscopy and EGD with  probable Savary dilation with Dr. Jarold Motto procedures were discussed in detail with the patient and he is agreeable to proceed. We'll obtain consent from his primary care physician Dr. Jarome Matin for patient hold Plavix for 5-6 days prior to the procedures

## 2013-06-27 ENCOUNTER — Other Ambulatory Visit: Payer: Self-pay | Admitting: *Deleted

## 2013-07-07 ENCOUNTER — Telehealth: Payer: Self-pay | Admitting: Physician Assistant

## 2013-07-10 ENCOUNTER — Other Ambulatory Visit: Payer: Self-pay | Admitting: *Deleted

## 2013-07-10 MED ORDER — MOVIPREP 100 G PO SOLR: 1.0000 | ORAL | Status: DC

## 2013-07-11 ENCOUNTER — Telehealth: Payer: Self-pay | Admitting: *Deleted

## 2013-07-11 NOTE — Telephone Encounter (Signed)
Waiting for answer from Dr. Jarome Matin on the anticoagulation letter sent 06-26-2013.

## 2013-07-17 ENCOUNTER — Telehealth: Payer: Self-pay | Admitting: *Deleted

## 2013-07-17 NOTE — Telephone Encounter (Signed)
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 He can stop the Plavix on 08-02-2013 and resume the Plavix on the day after the procedure , resume it on 08-10-2013 per Dr. Jarome Matin.

## 2013-07-25 ENCOUNTER — Encounter: Payer: Self-pay | Admitting: Gastroenterology

## 2013-08-07 DIAGNOSIS — Z125 Encounter for screening for malignant neoplasm of prostate: Secondary | ICD-10-CM | POA: Diagnosis not present

## 2013-08-07 DIAGNOSIS — E782 Mixed hyperlipidemia: Secondary | ICD-10-CM | POA: Diagnosis not present

## 2013-08-07 DIAGNOSIS — I10 Essential (primary) hypertension: Secondary | ICD-10-CM | POA: Diagnosis not present

## 2013-08-09 ENCOUNTER — Encounter: Payer: Medicare Other | Admitting: Gastroenterology

## 2013-08-09 ENCOUNTER — Ambulatory Visit (AMBULATORY_SURGERY_CENTER): Payer: Medicare Other | Admitting: Gastroenterology

## 2013-08-09 ENCOUNTER — Encounter: Payer: Self-pay | Admitting: Gastroenterology

## 2013-08-09 VITALS — BP 143/74 | HR 48 | Temp 97.1°F | Resp 14 | Ht 70.5 in | Wt 176.0 lb

## 2013-08-09 DIAGNOSIS — D126 Benign neoplasm of colon, unspecified: Secondary | ICD-10-CM

## 2013-08-09 DIAGNOSIS — R1310 Dysphagia, unspecified: Secondary | ICD-10-CM

## 2013-08-09 DIAGNOSIS — I10 Essential (primary) hypertension: Secondary | ICD-10-CM | POA: Diagnosis not present

## 2013-08-09 DIAGNOSIS — Z86010 Personal history of colonic polyps: Secondary | ICD-10-CM

## 2013-08-09 DIAGNOSIS — K209 Esophagitis, unspecified without bleeding: Secondary | ICD-10-CM | POA: Diagnosis not present

## 2013-08-09 DIAGNOSIS — K222 Esophageal obstruction: Secondary | ICD-10-CM

## 2013-08-09 DIAGNOSIS — D509 Iron deficiency anemia, unspecified: Secondary | ICD-10-CM

## 2013-08-09 DIAGNOSIS — K2970 Gastritis, unspecified, without bleeding: Secondary | ICD-10-CM | POA: Diagnosis not present

## 2013-08-09 DIAGNOSIS — D649 Anemia, unspecified: Secondary | ICD-10-CM | POA: Diagnosis not present

## 2013-08-09 MED ORDER — SODIUM CHLORIDE 0.9 % IV SOLN: 500.0000 mL | INTRAVENOUS | Status: DC

## 2013-08-09 NOTE — Op Note (Signed)
[place]  [address]. [place], [id number]   COLONOSCOPY PROCEDURE REPORT  PATIENT: Herbert, Marken  MR#: [id number] BIRTHDATE: [birth year] , 60  yrs. old GENDER: Male ENDOSCOPIST: Sable Feil, MD, [facility] REFERRED BY: PROCEDURE DATE:  08/09/2013 PROCEDURE:   Colonoscopy with snare polypectomy First Screening Colonoscopy - Avg.  risk and is 50 yrs.  old or older - No.      History of Adenoma - Now for follow-up colonoscopy & has been > or = to 3 yrs.  Yes hx of adenoma.  Has been 3 or more years since last colonoscopy.  Polyps Removed Today? Yes. ASA CLASS:   Class III INDICATIONS:Colorectal cancer screening, Patient's family history of colon polyps, and Patient's personal history of colon polyps. MEDICATIONS: propofol (Diprivan) 350mg  IV  DESCRIPTION OF PROCEDURE:   After the risks benefits and alternatives of the procedure were thoroughly explained, informed consent was obtained.  A digital rectal exam revealed no abnormalities of the rectum.   The LB [id number]  endoscope was introduced through the anus and advanced to the cecum, which was identified by both the appendix and ileocecal valve. No adverse events experienced.   The quality of the prep was good, using MoviPrep  The instrument was then slowly withdrawn as the colon was fully examined.      COLON FINDINGS: Four polypoid shaped and smooth sessile polyps ranging between 3-31mm in size were found at the cecum.  A polypectomy was performed using hot forceps.  The resection was complete and the polyp tissue was completely retrieved.   The colon was otherwise normal.  There was no diverticulosis, inflammation, polyps or cancers unless previously stated.  Retroflexed views revealed no abnormalities. The time to cecum=9 minutes 32 seconds. Withdrawal time=19 minutes 56 seconds.  The scope was withdrawn and the procedure completed. COMPLICATIONS: There were no complications.  ENDOSCOPIC  IMPRESSION: 1.   Four sessile polyps ranging between 3-67mm in size were found at the cecum; polypectomy was performed using hot forceps ...high risk here for colon cancar per polyps and +FH colon ca in his parents 2.   The colon was otherwise normal  RECOMMENDATIONS: Repeat Colonoscopy in 3 years...will hold Plavx for 3 days   eSigned:  Sable Feil, MD, [facility] 08/09/2013 11:27 AM   cc:   PATIENT NAME:  Yovany, Clock MR#: [id number]

## 2013-08-09 NOTE — Patient Instructions (Signed)
Findings:  Polyps, Stricture dilated Recommendations:  Repeat colonoscopy in  Years, HOLD PLAVIX FOR 3 DAYS. Continue current Medications, wait for pathology results.  YOU HAD AN ENDOSCOPIC PROCEDURE TODAY AT [facility]: Refer to the procedure report that was given to you for any specific questions about what was found during the examination.  If the procedure report does not answer your questions, please call your gastroenterologist to clarify.  If you requested that your care partner not be given the details of your procedure findings, then the procedure report has been included in a sealed envelope for you to review at your convenience later.  YOU SHOULD EXPECT: Some feelings of bloating in the abdomen. Passage of more gas than usual.  Walking can help get rid of the air that was put into your GI tract during the procedure and reduce the bloating. If you had a lower endoscopy (such as a colonoscopy or flexible sigmoidoscopy) you may notice spotting of blood in your stool or on the toilet paper. If you underwent a bowel prep for your procedure, then you may not have a normal bowel movement for a few days.  DIET: Your first meal following the procedure should be a light meal and then it is ok to progress to your normal diet.  A half-sandwich or bowl of soup is an example of a good first meal.  Heavy or fried foods are harder to digest and may make you feel nauseous or bloated.  Likewise meals heavy in dairy and vegetables can cause extra gas to form and this can also increase the bloating.  Drink plenty of fluids but you should avoid alcoholic beverages for 24 hours.  ACTIVITY: Your care partner should take you home directly after the procedure.  You should plan to take it easy, moving slowly for the rest of the day.  You can resume normal activity the day after the procedure however you should NOT DRIVE or use heavy machinery for 24 hours (because of the sedation medicines used during  the test).    SYMPTOMS TO REPORT IMMEDIATELY: A gastroenterologist can be reached at any hour.  During normal business hours, 8:30 AM to 5:00 PM Monday through Friday, call [phone].  After hours and on weekends, please call the GI answering service at [phone] who will take a message and have the physician on call contact you.   Following lower endoscopy (colonoscopy or flexible sigmoidoscopy):  Excessive amounts of blood in the stool  Significant tenderness or worsening of abdominal pains  Swelling of the abdomen that is new, acute  Fever of 100F or higher  Following upper endoscopy (EGD)  Vomiting of blood or coffee ground material  New chest pain or pain under the shoulder blades  Painful or persistently difficult swallowing  New shortness of breath  Fever of 100F or higher  Black, tarry-looking stools  FOLLOW UP: If any biopsies were taken you will be contacted by phone or by letter within the next 1-3 weeks.  Call your gastroenterologist if you have not heard about the biopsies in 3 weeks.  Our staff will call the home number listed on your records the next business day following your procedure to check on you and address any questions or concerns that you may have at that time regarding the information given to you following your procedure. This is a courtesy call and so if there is no answer at the home number and we have not heard from you through  the emergency physician on call, we will assume that you have returned to your regular daily activities without incident.  SIGNATURES/CONFIDENTIALITY: You and/or your care partner have signed paperwork which will be entered into your electronic medical record.  These signatures attest to the fact that that the information above on your After Visit Summary has been reviewed and is understood.  Full responsibility of the confidentiality of this discharge information lies with you and/or your care-partner.  Please follow all  discharge instructions given to you by the recovery room nurse. If you have any questions or problems after discharge please call one of the numbers listed above. You will receive a phone call in the am to see how you are doing and answer any questions you may have. Thank you for choosing [facility] for your health care needs.

## 2013-08-09 NOTE — Progress Notes (Signed)
Report to pacu rn, vss, bbs=clear 

## 2013-08-09 NOTE — Progress Notes (Addendum)
Called to procedure room post procedure and instructed by tech Beryle Flock location and removal type of polyps with colonoscopy.  Also instructed that sev small polyps were cautirized but not retrieved for pathology. instructed on esophageal bx x2 jars and that pt was dilated with maloney, size and that heme was present but dilation s/p biopsies.  emw

## 2013-08-09 NOTE — Op Note (Signed)
[facility]  [address] [place], [address]   ENDOSCOPY PROCEDURE REPORT  PATIENT: Norvel, Wenker  MR#: [id number] BIRTHDATE: [birth year] , 22  yrs. old GENDER: Male ENDOSCOPIST:Keitra Carusone Consuello Masse, MD, [facility] REFERRED BY: PROCEDURE DATE:  08/09/2013 PROCEDURE:   EGD w/ biopsy and Maloney dilation of esophagus ASA CLASS:    Class III INDICATIONS: Dysphagia. MEDICATION: There was residual sedation effect present from prior procedure and propofol (Diprivan) 150mg  IV TOPICAL ANESTHETIC:  DESCRIPTION OF PROCEDURE:   After the risks and benefits of the procedure were explained, informed consent was obtained.  The LB [id number]  endoscope was introduced through the mouth  and advanced to the second portion of the duodenum .  The instrument was slowly withdrawn as the mucosa was fully examined.      DUODENUM: The duodenal mucosa showed no abnormalities in the bulb and second portion of the duodenum.  STOMACH: The mucosa of the stomach appeared normal.  ESOPHAGUS: The mucosa of the esophagus appeared normal.  Multiple biopsies were performed.    Retroflexed views revealed no abnormalities.There is a large "Inlet Pouch" in the cervical esophagus..see pictures.Biopsies were done,then dilated #46F Maloney dilator...toterated well.    The scope was then withdrawn from the patient and the procedure completed .Random biopsies of esophagus body also done.  COMPLICATIONS: There were no complications.   ENDOSCOPIC IMPRESSION: 1.   The duodenal mucosa showed no abnormalities in the bulb and second portion of the duodenum 2.   The mucosa of the stomach appeared normal 3.   The mucosa of the esophagus appeared normal; multiple biopsies 4.     [place] with associated stricture biopsied and dilsted.  RECOMMENDATIONS: 1.  Continue current medications 2.  Await pathology results    _______________________________ eSigned:  Sable Feil, MD, [facility]  08/09/2013 11:37 AM      PATIENT NAME:  Scott, Vanderveer MR#: [id number]

## 2013-08-10 ENCOUNTER — Telehealth: Payer: Self-pay | Admitting: *Deleted

## 2013-08-10 DIAGNOSIS — E785 Hyperlipidemia, unspecified: Secondary | ICD-10-CM | POA: Diagnosis not present

## 2013-08-10 DIAGNOSIS — H6120 Impacted cerumen, unspecified ear: Secondary | ICD-10-CM | POA: Diagnosis not present

## 2013-08-10 DIAGNOSIS — Z79899 Other long term (current) drug therapy: Secondary | ICD-10-CM | POA: Diagnosis not present

## 2013-08-10 DIAGNOSIS — K219 Gastro-esophageal reflux disease without esophagitis: Secondary | ICD-10-CM | POA: Diagnosis not present

## 2013-08-10 DIAGNOSIS — Z0000 Encounter for general adult medical examination without abnormal findings: Secondary | ICD-10-CM | POA: Diagnosis not present

## 2013-08-10 NOTE — Telephone Encounter (Signed)
  Follow up Call-  Call back number 08/09/2013  Post procedure Call Back phone  # [phone]  Permission to leave phone message Yes     Patient questions:  Do you have a fever, pain , or abdominal swelling? no Pain Score  0 *  Have you tolerated food without any problems? yes  Have you been able to return to your normal activities? yes  Do you have any questions about your discharge instructions: Diet   no Medications  no Follow up visit  no  Do you have questions or concerns about your Care? no  Actions: * If pain score is 4 or above: No action needed, pain <4.

## 2013-08-14 ENCOUNTER — Encounter: Payer: Self-pay | Admitting: Gastroenterology

## 2013-10-23 DIAGNOSIS — D1801 Hemangioma of skin and subcutaneous tissue: Secondary | ICD-10-CM | POA: Diagnosis not present

## 2013-10-23 DIAGNOSIS — L821 Other seborrheic keratosis: Secondary | ICD-10-CM | POA: Diagnosis not present

## 2013-10-23 DIAGNOSIS — Z85828 Personal history of other malignant neoplasm of skin: Secondary | ICD-10-CM | POA: Diagnosis not present

## 2013-10-23 DIAGNOSIS — Z85820 Personal history of malignant melanoma of skin: Secondary | ICD-10-CM | POA: Diagnosis not present

## 2013-12-08 DIAGNOSIS — Z23 Encounter for immunization: Secondary | ICD-10-CM | POA: Diagnosis not present

## 2013-12-14 DIAGNOSIS — H5200 Hypermetropia, unspecified eye: Secondary | ICD-10-CM | POA: Diagnosis not present

## 2013-12-14 DIAGNOSIS — H2510 Age-related nuclear cataract, unspecified eye: Secondary | ICD-10-CM | POA: Diagnosis not present

## 2013-12-14 DIAGNOSIS — H34239 Retinal artery branch occlusion, unspecified eye: Secondary | ICD-10-CM | POA: Diagnosis not present

## 2013-12-14 DIAGNOSIS — H35369 Drusen (degenerative) of macula, unspecified eye: Secondary | ICD-10-CM | POA: Diagnosis not present

## 2014-01-15 ENCOUNTER — Other Ambulatory Visit: Payer: Self-pay | Admitting: Dermatology

## 2014-01-15 DIAGNOSIS — L821 Other seborrheic keratosis: Secondary | ICD-10-CM | POA: Diagnosis not present

## 2014-01-15 DIAGNOSIS — D485 Neoplasm of uncertain behavior of skin: Secondary | ICD-10-CM | POA: Diagnosis not present

## 2014-01-15 DIAGNOSIS — D235 Other benign neoplasm of skin of trunk: Secondary | ICD-10-CM | POA: Diagnosis not present

## 2014-01-15 DIAGNOSIS — L910 Hypertrophic scar: Secondary | ICD-10-CM | POA: Diagnosis not present

## 2014-01-15 DIAGNOSIS — Z85828 Personal history of other malignant neoplasm of skin: Secondary | ICD-10-CM | POA: Diagnosis not present

## 2014-04-16 DIAGNOSIS — Z23 Encounter for immunization: Secondary | ICD-10-CM | POA: Diagnosis not present

## 2014-05-14 DIAGNOSIS — Z85828 Personal history of other malignant neoplasm of skin: Secondary | ICD-10-CM | POA: Diagnosis not present

## 2014-05-14 DIAGNOSIS — L738 Other specified follicular disorders: Secondary | ICD-10-CM | POA: Diagnosis not present

## 2014-05-14 DIAGNOSIS — D1801 Hemangioma of skin and subcutaneous tissue: Secondary | ICD-10-CM | POA: Diagnosis not present

## 2014-05-14 DIAGNOSIS — L821 Other seborrheic keratosis: Secondary | ICD-10-CM | POA: Diagnosis not present

## 2014-05-14 DIAGNOSIS — L812 Freckles: Secondary | ICD-10-CM | POA: Diagnosis not present

## 2014-05-14 DIAGNOSIS — L910 Hypertrophic scar: Secondary | ICD-10-CM | POA: Diagnosis not present

## 2014-05-14 DIAGNOSIS — D225 Melanocytic nevi of trunk: Secondary | ICD-10-CM | POA: Diagnosis not present

## 2014-05-14 DIAGNOSIS — L720 Epidermal cyst: Secondary | ICD-10-CM | POA: Diagnosis not present

## 2014-08-03 HISTORY — PX: JOINT REPLACEMENT: SHX530

## 2014-08-15 DIAGNOSIS — Z125 Encounter for screening for malignant neoplasm of prostate: Secondary | ICD-10-CM | POA: Diagnosis not present

## 2014-08-15 DIAGNOSIS — E782 Mixed hyperlipidemia: Secondary | ICD-10-CM | POA: Diagnosis not present

## 2014-08-15 DIAGNOSIS — I10 Essential (primary) hypertension: Secondary | ICD-10-CM | POA: Diagnosis not present

## 2014-08-15 DIAGNOSIS — Z008 Encounter for other general examination: Secondary | ICD-10-CM | POA: Diagnosis not present

## 2014-08-22 DIAGNOSIS — E782 Mixed hyperlipidemia: Secondary | ICD-10-CM | POA: Diagnosis not present

## 2014-08-22 DIAGNOSIS — I10 Essential (primary) hypertension: Secondary | ICD-10-CM | POA: Diagnosis not present

## 2014-08-22 DIAGNOSIS — Z0000 Encounter for general adult medical examination without abnormal findings: Secondary | ICD-10-CM | POA: Diagnosis not present

## 2014-08-22 DIAGNOSIS — Z6824 Body mass index (BMI) 24.0-24.9, adult: Secondary | ICD-10-CM | POA: Diagnosis not present

## 2014-08-22 DIAGNOSIS — Z1389 Encounter for screening for other disorder: Secondary | ICD-10-CM | POA: Diagnosis not present

## 2014-08-22 DIAGNOSIS — M1990 Unspecified osteoarthritis, unspecified site: Secondary | ICD-10-CM | POA: Diagnosis not present

## 2014-08-22 DIAGNOSIS — Z862 Personal history of diseases of the blood and blood-forming organs and certain disorders involving the immune mechanism: Secondary | ICD-10-CM | POA: Diagnosis not present

## 2014-08-28 DIAGNOSIS — Z1212 Encounter for screening for malignant neoplasm of rectum: Secondary | ICD-10-CM | POA: Diagnosis not present

## 2014-11-19 DIAGNOSIS — D225 Melanocytic nevi of trunk: Secondary | ICD-10-CM | POA: Diagnosis not present

## 2014-11-19 DIAGNOSIS — L820 Inflamed seborrheic keratosis: Secondary | ICD-10-CM | POA: Diagnosis not present

## 2014-11-19 DIAGNOSIS — Z85820 Personal history of malignant melanoma of skin: Secondary | ICD-10-CM | POA: Diagnosis not present

## 2014-11-19 DIAGNOSIS — L570 Actinic keratosis: Secondary | ICD-10-CM | POA: Diagnosis not present

## 2014-11-19 DIAGNOSIS — L812 Freckles: Secondary | ICD-10-CM | POA: Diagnosis not present

## 2014-11-19 DIAGNOSIS — L910 Hypertrophic scar: Secondary | ICD-10-CM | POA: Diagnosis not present

## 2014-11-19 DIAGNOSIS — D1801 Hemangioma of skin and subcutaneous tissue: Secondary | ICD-10-CM | POA: Diagnosis not present

## 2014-11-19 DIAGNOSIS — L821 Other seborrheic keratosis: Secondary | ICD-10-CM | POA: Diagnosis not present

## 2014-11-19 DIAGNOSIS — Z85828 Personal history of other malignant neoplasm of skin: Secondary | ICD-10-CM | POA: Diagnosis not present

## 2014-11-23 ENCOUNTER — Encounter (HOSPITAL_COMMUNITY): Payer: Medicare Other

## 2014-11-26 ENCOUNTER — Ambulatory Visit (HOSPITAL_COMMUNITY): Payer: Medicare Other | Attending: Cardiology | Admitting: Radiology

## 2014-11-26 VITALS — BP 154/83 | HR 54 | Ht 72.0 in | Wt 174.0 lb

## 2014-11-26 DIAGNOSIS — R0600 Dyspnea, unspecified: Secondary | ICD-10-CM

## 2014-11-26 DIAGNOSIS — I10 Essential (primary) hypertension: Secondary | ICD-10-CM | POA: Insufficient documentation

## 2014-11-26 DIAGNOSIS — I441 Atrioventricular block, second degree: Secondary | ICD-10-CM | POA: Diagnosis not present

## 2014-11-26 DIAGNOSIS — R0609 Other forms of dyspnea: Secondary | ICD-10-CM | POA: Insufficient documentation

## 2014-11-26 DIAGNOSIS — R002 Palpitations: Secondary | ICD-10-CM | POA: Diagnosis not present

## 2014-11-26 IMAGING — NM NM MYOCAR MULTI W/ SPECT
6 series · 36 of 36 positions shown · non-contrast
Comparison: none

[Series 1: stress_(id)_sa · 6.5mm · 6.51mm/px · 6 of 64 frames shown (1 of 2)]
[frame 6/64]
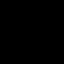
[frame 16/64]
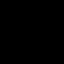
[frame 27/64]
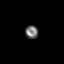
[frame 38/64]
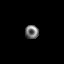
[frame 48/64]
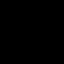
[frame 59/64]
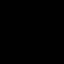

[Series 1: rest · 6.51mm/px · 6 of 64 frames shown]
[frame 6/64]
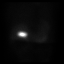
[frame 16/64]
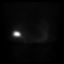
[frame 27/64]
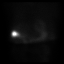
[frame 38/64]
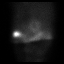
[frame 48/64]
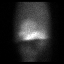
[frame 59/64]
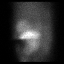

[Series 1: rest_(id)_sa · 6.5mm · 6.51mm/px · 6 of 64 frames shown]
[frame 6/64]
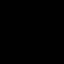
[frame 16/64]
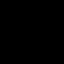
[frame 27/64]
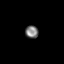
[frame 38/64]
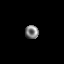
[frame 48/64]
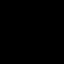
[frame 59/64]
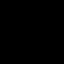

[Series 1: stress_(id)_sa · 6.5mm · 6.51mm/px · 6 of 512 frames shown (2 of 2)]
[frame 43/512]
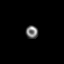
[frame 128/512]
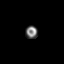
[frame 214/512]
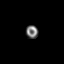
[frame 299/512]
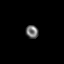
[frame 384/512]
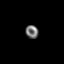
[frame 470/512]
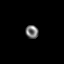

[Series 2: stress · 6.51mm/px · 6 of 64 frames shown (1 of 2)]
[frame 6/64]
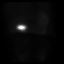
[frame 16/64]
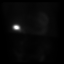
[frame 27/64]
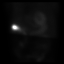
[frame 38/64]
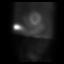
[frame 48/64]
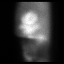
[frame 59/64]
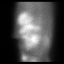

[Series 2: stress · 6.51mm/px · 6 of 512 frames shown (2 of 2)]
[frame 43/512]
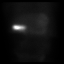
[frame 128/512]
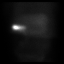
[frame 214/512]
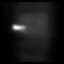
[frame 299/512]
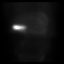
[frame 384/512]
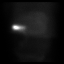
[frame 470/512]
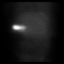

[36 of 36 positions shown; findings below may reference images not displayed]

Canned report from images found in remote index.

Refer to host system for actual result text.

## 2014-11-26 MED ORDER — TECHNETIUM TC 99M SESTAMIBI GENERIC - CARDIOLITE
33.0000 | Freq: Once | INTRAVENOUS | Status: AC | PRN
  Administered 2014-11-26: 33 via INTRAVENOUS

## 2014-11-26 MED ORDER — TECHNETIUM TC 99M SESTAMIBI GENERIC - CARDIOLITE
11.0000 | Freq: Once | INTRAVENOUS | Status: AC | PRN
  Administered 2014-11-26: 11 via INTRAVENOUS

## 2014-11-26 NOTE — Progress Notes (Signed)
[facility] [address] [place], [place] [address] [phone]    Cardiology Nuclear Med Study  Alexander Murray is a 74 y.o. male     MRN : [id number]     DOB: 1941/04/23  Procedure Date: 11/26/2014  Nuclear Med Background Indication for Stress Test:  Evaluation for Ischemia  History:  n/a Cardiac Risk Factors: Hypertension  Symptoms:  DOE and Palpitations   Nuclear Pre-Procedure Caffeine/Decaff Intake:  None NPO After: 8:00pm   Lungs:  clear O2 Sat: 98% on room air. IV 0.9% NS with Angio Cath:  22g  IV Site: R Antecubital  IV Started by:  Perrin Maltese, EMT-P  Chest Size (in):  42 Cup Size: n/a  Height: 6' (1.829 m)  Weight:  174 lb (78.926 kg)  BMI:  Body mass index is 23.59 kg/(m^2). Tech Comments:  n/a    Nuclear Med Study 1 or 2 day study: 1 day  Stress Test Type:  Stress  Reading MD: n/a  Order Authorizing Provider:  [name] MD  Resting Radionuclide: Technetium 93mSestamibi  Resting Radionuclide Dose: 11.0 mCi   Stress Radionuclide:  Technetium 968mestamibi  Stress Radionuclide Dose: 33.0 mCi           Stress Protocol Rest HR: 54 Stress HR: 136  Rest BP: 154/83 Stress BP: 123/73  Exercise Time (min): 5:05 METS: 7.0   Predicted Max HR: 145 bpm % Max HR: 93.79 bpm Rate Pressure Product: 24752   Dose of Adenosine (mg):  n/a Dose of Lexiscan: n/a mg  Dose of Atropine (mg): n/a Dose of Dobutamine: n/a mcg/kg/min (at max HR)  Stress Test Technologist: ShGlade LloydBS-ES  Nuclear Technologist:  ElEarl ManyCNMT     Rest Procedure:  Myocardial perfusion imaging was performed at rest 45 minutes following the intravenous administration of Technetium 9946mstamibi. Rest ECG: Sinus rhythm with type I second degree AV block.  Stress Procedure:  The patient exercised on the treadmill utilizing the Bruce Protocol for 5:05 minutes. The patient stopped due to SOB, fatigue and denied any chest pain.  Technetium 53m61mtamibi  was injected at peak exercise and myocardial perfusion imaging was performed after a brief delay. Stress ECG: Significant ST abnormalities consistent with ischemia.  QPS Raw Data Images:  Acquisition technically good; normal left ventricular size. Stress Images:  There is decreased uptake in the anteroseptal and inferior wall. Rest Images:  There is decreased uptake in the anteroseptal and inferior wall. Subtraction (SDS):  No evidence of ischemia. Transient Ischemic Dilatation (Normal <1.22):  0.97 Lung/Heart Ratio (Normal <0.45):  0.35  Quantitative Gated Spect Images QGS EDV:  119 ml QGS ESV:  49 ml  Impression Exercise Capacity:  Fair exercise capacity. BP Response:  Normal blood pressure response. Clinical Symptoms:  There is dyspnea. ECG Impression:  Significant ST abnormalities consistent with ischemia. Comparison with Prior Nuclear Study: No images to compare  Overall Impression:  Low risk stress nuclear study with small, mild, fixed anteroseptal and inferior defects (possible soft tissue attenuation); no ischemia; note patient in type 1 second degree AV block on baseline ECG; HR increased to 127 with exercise; Mobitz 1, 2:1 AV block and occasional junctional beats in recovery; patient seen by Dr RossHarrington Challengeror to leaving the office.  LV Ejection Fraction: 59%.  LV Wall Motion:  NL LV Function; NL Wall Motion  Alexander Murray

## 2014-11-27 ENCOUNTER — Telehealth: Payer: Self-pay | Admitting: *Deleted

## 2014-11-27 NOTE — Telephone Encounter (Signed)
Notes Recorded by Darlin Coco, MD on 11/27/2014 at 12:02 PM Please report to patient and send a copy of report and of this note to Dr. Janie Morning. The treadmill Myoview stress test did not show any ischemia. The left ventricular systolic function was normal. At baseline the patient was in Mobitz 1 AV block with marked bradycardia. With exercise he was able to get his heart rate up to 127. I spoke with Dr. Dorris Carnes about him. We both are concerned that his marked resting bradycardia may be contributing to his lack of energy and exertional dyspnea. Dr. Harrington Challenger was going to speak with EP about seeing him. I phoned the patient last evening and told him about these plans and he was agreeable.

## 2014-11-28 ENCOUNTER — Telehealth: Payer: Self-pay | Admitting: Gastroenterology

## 2014-11-28 NOTE — Telephone Encounter (Signed)
Rec'd from [place] forward 22 pages to Dr. Sharlett Iles

## 2014-12-03 NOTE — Progress Notes (Signed)
Appointment with Dr Caryl Comes 12/05/14

## 2014-12-04 ENCOUNTER — Institutional Professional Consult (permissible substitution): Payer: Medicare Other | Admitting: Internal Medicine

## 2014-12-05 ENCOUNTER — Encounter: Payer: Self-pay | Admitting: Internal Medicine

## 2014-12-05 ENCOUNTER — Ambulatory Visit (INDEPENDENT_AMBULATORY_CARE_PROVIDER_SITE_OTHER): Payer: Medicare Other | Admitting: Internal Medicine

## 2014-12-05 VITALS — BP 110/54 | HR 62 | Ht 70.5 in | Wt 175.0 lb

## 2014-12-05 DIAGNOSIS — R001 Bradycardia, unspecified: Secondary | ICD-10-CM

## 2014-12-05 NOTE — Patient Instructions (Addendum)
Medication Instructions:   Your physician recommends that you continue on your current medications as directed. Please refer to the Current Medication list given to you today.   Labwork:  CBC AND TSH   Testing/Procedures:  Your physician has recommended that you wear an event monitor. Event monitors are medical devices that record the heart's electrical activity. Doctors most often [place] these monitors to diagnose arrhythmias. Arrhythmias are problems with the speed or rhythm of the heartbeat. The monitor is a small, portable device. You can wear one while you do your normal daily activities. This is usually used to diagnose what is causing palpitations/syncope (passing out).   Your physician has requested that you have an echocardiogram. Echocardiography is a painless test that uses sound waves to create images of your heart. It provides your doctor with information about the size and shape of your heart and how well your heart's chambers and valves are working. This procedure takes approximately one hour. There are no restrictions for this procedure.    Follow-Up:  WITH DR Caryl Comes IN 5 WEEKS PER KLEIN    Any Other Special Instructions Will Be Listed Below (If Applicable).

## 2014-12-05 NOTE — Progress Notes (Signed)
ELECTROPHYSIOLOGY CONSULT NOTE  Patient ID: Alexander Murray, MRN: [id number], DOB/AGE: January 17, 1941 74 y.o. Admit date: (Not on file) Date of Consult: 12/05/2014  Primary Physician: Donnajean Lopes, MD Primary Cardiologist: new  Chief Complaint: abnormal Heart rhythm   HPI Alexander Murray is a 74 y.o. 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  He has a remote history of PVCs and an echocardiogram done 2010 demonstrated essentially normal function and structure. There was small amount of aortic sclerosis with a 1.5 m/s jet. Metoprolol was initiated at that time which largely treated for symptomatic PVCs as well as his hypertension.  More recently, he noted an impairment in exercise tolerance. This was notable primarily with shortness of breath climbing stairs or carrying things. It was unaccompanied by chest discomfort. He had no resting dyspnea. He noted his heart rates were in the 30s. HEENT his PCP decided to discontinue his metoprolol. There was no significant interval improvement.  Because of the constellation of symptoms he underwent stress testing which demonstrated a normal perfusion scan a peak heart rate is about 130 but he was noted both before exercise and after exercise to have significant bradycardia with rates in the 30s related to 2-1 heart block. He had significant PR prolongation at rest even with one-to-one conduction   350-400 ms  and PR interval of up to 500 ms during Wenckebach physiology. During peak exercise he persisted with first degree AV block the range of 350 ms such that he had essentially atrial contraction on top of ventricular systole.  In recovery he had high-grade block with 2 consecutively nonconducted P waves.  In this regard it is also noteworthy that he has had brief episodes of lightheadedness lasting only seconds. They occur a couple of times a week. They are almost presyncope. ECGs  were obtained from his primary care physician in 2007 his PR interval was 220 ms in 2013 his PR interval was 400 ms. Primary care office notes were reviewed.  It is also noteworthy that he has a history of hemochromatosis and he has been treated with ongoing phlebotomy. More recently he has noted that his hemoglobin has fallen more than he would've anticipated 14---11. Stool guaiacs are apparently normal.      Past Medical History  Diagnosis Date  . Palpitations   . Hypercholesteremia   . Cardiac arrhythmia   . Skin cancer   . Esophageal stricture   . Hypertension   . Asthma   . Hypercalcemia   . Hemochromatosis   . Osteoarthritis       Surgical History:  Past Surgical History  Procedure Laterality Date  . Hernia repair    . Knee surgery Right     x2  . Colonoscopy    . Upper gastrointestinal endoscopy    . Ankle surgery       Home Meds: Prior to Admission medications   Medication Sig Start Date End Date Taking? Authorizing Provider  amLODipine (NORVASC) 2.5 MG tablet Take 2.5 mg by mouth daily.   Yes Historical Provider, MD  aspirin 81 MG tablet Take 81 mg by mouth daily.   Yes Historical Provider, MD  chlorthalidone (HYGROTON) 25 MG tablet Take 25 mg by mouth every other day.    Yes Historical Provider, MD  clopidogrel (PLAVIX) 75 MG tablet Take 75 mg by mouth every other day.   Yes Historical Provider, MD  doxazosin (CARDURA) 8 MG tablet Take 4 mg by mouth  daily.    Yes Historical Provider, MD  esomeprazole (NEXIUM) 20 MG capsule Take 20 mg by mouth daily at 12 noon.   Yes Historical Provider, MD  Misc Natural Products (GLUCOSAMINE CHONDROITIN ADV PO) Take 1,500 mg by mouth daily.   Yes Historical Provider, MD  mometasone (NASONEX) 50 MCG/ACT nasal spray Place 2 sprays into the nose daily as needed (for cough).    Yes Historical Provider, MD  montelukast (SINGULAIR) 10 MG tablet  07/05/13  Yes Historical Provider, MD  Multiple Vitamin (MULTIVITAMIN) capsule Take 1  capsule by mouth daily.   Yes Historical Provider, MD  simvastatin (ZOCOR) 40 MG tablet Take 40 mg by mouth daily.   Yes Historical Provider, MD  vitamin C (ASCORBIC ACID) 500 MG tablet Take by mouth daily.   Yes Historical Provider, MD       Allergies: No Known Allergies  History   Social History  . Marital Status: Married    Spouse Name: N/A  . Number of Children: N/A  . Years of Education: N/A   Occupational History  . retired - Doctor, general practice    Social History Main Topics  . Smoking status: Former Research scientist (life sciences)  . Smokeless tobacco: Former Systems developer  . Alcohol Use: No  . Drug Use: No  . Sexual Activity: Not on file   Other Topics Concern  . Not on file   Social History Narrative     Family History  Problem Relation Age of Onset  . Colon cancer Mother   . Heart disease Mother   . Colon cancer Father   . Heart disease Father   . Prostate cancer Brother   . Hemochromatosis Brother      ROS:  Please see the history of present illness.     All other systems reviewed and negative.    Physical Exam:   Blood pressure 110/54, pulse 62, height 5' 10.5" (1.791 m), weight 175 lb (79.379 kg). General: Well developed, well nourished male in no acute distress. Head: Normocephalic, atraumatic, sclera non-icteric, no xanthomas, nares are without discharge. EENT: normal Lymph Nodes:  none Back: without scoliosis/kyphosis , no CVA tendersness Neck: Negative for carotid bruits. JVD not elevated. Carotid upstrokes are delayed  Lungs: Clear bilaterally to auscultation without wheezes, rales, or rhonchi. Breathing is unlabored. Heart: RRR with diminished S1  Probably splitS2.  3/6 systolic  murmur , rubs, or gallops appreciated. Abdomen: Soft, non-tender, non-distended with normoactive bowel sounds. No hepatomegaly. No rebound/guarding. No obvious abdominal masses. Msk:  Strength and tone appear normal for age. Extremities: No clubbing or cyanosis. No + edema.  Distal pedal pulses are  2+ and equal bilaterally. Skin: Warm and Dry Neuro: Alert and oriented X 3. CN III-XII intact Grossly normal sensory and motor function . Psych:  Responds to questions appropriately with a normal affect.      Labs: Cardiac Enzymes No results for input(s): CKTOTAL, CKMB, TROPONINI in the last 72 hours. CBC No results found for: WBC, HGB, HCT, MCV, PLT PROTIME: No results for input(s): LABPROT, INR in the last 72 hours. Chemistry No results for input(s): NA, K, CL, CO2, BUN, CREATININE, CALCIUM, PROT, BILITOT, ALKPHOS, ALT, AST, GLUCOSE in the last 168 hours.  Invalid input(s): LABALBU Lipids No results found for: CHOL, HDL, LDLCALC, TRIG BNP No results found for: PROBNP Thyroid Function Tests: No results for input(s): TSH, T4TOTAL, T3FREE, THYROIDAB in the last 72 hours.  Invalid input(s): FREET3    Miscellaneous No results found for: DDIMER  Radiology/Studies:  No results found.  EKG: Sinus rhythm at 62 Intervals 35/09/41    Assessment and Plan:   Mobitz 1 heart block   Lightheaded spells  High-grade heart block  Systolic ejection murmur  Hypertension   Hemochromatosis  first degree AV block   sinus bradycardia   the patient has significant conduction system disease in the context of hemochromatosis which may or may not have any bearing. It is not clear at this juncture whether the conduction system disease is symptomatic or not. The degree of first degree AV block could be contributing to his exercise tolerance. There is no indication for pacing for this.  I am more worried about the lightheaded spells in this regard. With high degree block noted with consecutively nonconducted P waves, event recorder looking for evidence of significant conduction system disease associated with his symptoms  He also had mild aortic stenosis/sclerosis in 2010. We will repeat his echocardiogram.   With his bradycardia, we will check a thyroid status. Laboratories were  reviewed from the outside office but did not include thyroids other laboratories were unrevealing   Alexander Murray

## 2014-12-06 LAB — CBC
HCT: 37.5 % — ABNORMAL LOW (ref 39.0–52.0)
Hemoglobin: 12.4 g/dL — ABNORMAL LOW (ref 13.0–17.0)
MCHC: 33 g/dL (ref 30.0–36.0)
MCV: 78.2 fl (ref 78.0–100.0)
Platelets: 170 10*3/uL (ref 150.0–400.0)
RBC: 4.79 Mil/uL (ref 4.22–5.81)
RDW: 18 % — ABNORMAL HIGH (ref 11.5–15.5)
WBC: 5 10*3/uL (ref 4.0–10.5)

## 2014-12-06 LAB — TSH: TSH: 1.95 u[IU]/mL (ref 0.35–4.50)

## 2014-12-10 ENCOUNTER — Telehealth: Payer: Self-pay | Admitting: Internal Medicine

## 2014-12-10 ENCOUNTER — Ambulatory Visit (INDEPENDENT_AMBULATORY_CARE_PROVIDER_SITE_OTHER): Payer: Medicare Other

## 2014-12-10 ENCOUNTER — Ambulatory Visit (HOSPITAL_COMMUNITY): Payer: Medicare Other | Attending: Cardiovascular Disease

## 2014-12-10 ENCOUNTER — Ambulatory Visit (INDEPENDENT_AMBULATORY_CARE_PROVIDER_SITE_OTHER): Payer: Medicare Other | Admitting: Internal Medicine

## 2014-12-10 ENCOUNTER — Other Ambulatory Visit: Payer: Self-pay

## 2014-12-10 ENCOUNTER — Encounter: Payer: Self-pay | Admitting: *Deleted

## 2014-12-10 DIAGNOSIS — I441 Atrioventricular block, second degree: Secondary | ICD-10-CM | POA: Insufficient documentation

## 2014-12-10 DIAGNOSIS — I10 Essential (primary) hypertension: Secondary | ICD-10-CM | POA: Insufficient documentation

## 2014-12-10 DIAGNOSIS — R001 Bradycardia, unspecified: Secondary | ICD-10-CM

## 2014-12-10 DIAGNOSIS — E785 Hyperlipidemia, unspecified: Secondary | ICD-10-CM | POA: Diagnosis not present

## 2014-12-10 NOTE — Telephone Encounter (Signed)
[facility] called regarding patient having 3 second pot secondary to second-degree AV block Mobitz 1, patient was sleeping while this happened. And was asymptomatic.

## 2014-12-10 NOTE — Progress Notes (Signed)
      Patient Care Team: [name], MD as PCP - General (Internal Medicine)   HPI  Alexander Murray is a 74 y.o. male Seen following an echocardiogram today. He was noted to have a heart rate in the low 30s.  He was seen a couple of weeks ago with first-degree AV block and some episodes of lightheadedness. It was not clear how these might be related. He is moving around as a left bundle  Reflecting, it is his impression that there are times where he has much less energy than others. He wonders whether this doesn't correlate with his heart rate of 30s as he feels that same way today.  Past Medical History  Diagnosis Date  . Palpitations   . Hypercholesteremia   . Cardiac arrhythmia   . Skin cancer   . Esophageal stricture   . Hypertension   . Asthma   . Hypercalcemia   . Hemochromatosis   . Osteoarthritis     Past Surgical History  Procedure Laterality Date  . Hernia repair    . Knee surgery Right     x2  . Colonoscopy    . Upper gastrointestinal endoscopy    . Ankle surgery      Current Outpatient Prescriptions  Medication Sig Dispense Refill  . amLODipine (NORVASC) 2.5 MG tablet Take 2.5 mg by mouth daily.    Marland Kitchen aspirin 81 MG tablet Take 81 mg by mouth daily.    . chlorthalidone (HYGROTON) 25 MG tablet Take 25 mg by mouth every other day.     . clopidogrel (PLAVIX) 75 MG tablet Take 75 mg by mouth every other day.    . doxazosin (CARDURA) 8 MG tablet Take 4 mg by mouth daily.     Marland Kitchen esomeprazole (NEXIUM) 20 MG capsule Take 20 mg by mouth daily at 12 noon.    . Misc Natural Products (GLUCOSAMINE CHONDROITIN ADV PO) Take 1,500 mg by mouth daily.    . mometasone (NASONEX) 50 MCG/ACT nasal spray Place 2 sprays into the nose daily as needed (for cough).     . montelukast (SINGULAIR) 10 MG tablet     . Multiple Vitamin (MULTIVITAMIN) capsule Take 1 capsule by mouth daily.    . simvastatin (ZOCOR) 40 MG tablet Take 40 mg by mouth daily.    . vitamin C (ASCORBIC ACID)  500 MG tablet Take by mouth daily.     No current facility-administered medications for this visit.    No Known Allergies  Review of Systems negative except from HPI and PMH  Physical Exam  110/54 Slow and irregular   rate and rhythm, no murmurs gallops or rub Soft with active bowel sounds No clubbing cyanosis none Edema Alert and oriented, grossly normal motor and sensory function Skin Warm and Dry  ECG demonstrates sinus rhythm at 60 with 2-1 block and occasional 3-2 heart block  Assessment and  Plan  Sinus bradycardia  High-grade 2:1 and 3:2 heart block  Lightheadedness and exercise intolerance  We will pursue a discrete 40 to try to identify whether there is an antiarrhythmic correlation between these episodes of presyncope and his heart rhythm. He will also work on trying to determine whether his symptoms really do correlate with slower rather than faster heart rates

## 2014-12-10 NOTE — Progress Notes (Signed)
Patient ID: Alexander Murray, male   DOB: [birth year], 74 y.o.   MRN: [id number] [employer] 30 day cardiac event monitor applied to patient.

## 2014-12-10 NOTE — Progress Notes (Signed)
2D Echo completed.   Bradycardia in the 30's bpm. Dr. Caryl Comes was notified. EKG is being performed. 12/10/2014

## 2014-12-11 ENCOUNTER — Telehealth: Payer: Self-pay | Admitting: Internal Medicine

## 2014-12-11 ENCOUNTER — Telehealth: Payer: Self-pay | Admitting: Physician Assistant

## 2014-12-11 NOTE — Telephone Encounter (Signed)
Per Leah from [facility], patient had 4 sec pause during alternating type I and type II 2nd degree AV block. Timing was 4:24 AM this morning. Heart watch contacted the patient, he had mild SOB during the episode however has since been asymptomatic. Will inform Dr. Caryl Comes. May need PPM once MD return heart watch tracing.   Hilbert Corrigan PA Pager: [phone]

## 2014-12-11 NOTE — Telephone Encounter (Signed)
I have received 2 more phone calls from [employer] with notification about episodes of bradycardia down to 25 beats per minutes with the mixture of type I and type II Mobitz second-degree AV block. Longest episode of bradycardia lasting up 40 seconds. Patient was sleeping during the episode and was asymptomatic

## 2014-12-12 ENCOUNTER — Telehealth: Payer: Self-pay | Admitting: *Deleted

## 2014-12-12 NOTE — Telephone Encounter (Signed)
Called to review monitor findings thus far with HRs in 20-30s while sleeping. Patient reports only occasionally waking up feeling "air hungry".  He also reports feeling vague light headedness during the day sometimes, but this was discussed with Dr. Caryl Comes. He states that if Dr. Caryl Comes sees enough and wants to proceed with PPM and stop monitor to call him. We reviewed situations in which patient should call 911 or go to nearest emergency room. Patient verbalized understanding and agreeable to plan.

## 2014-12-13 ENCOUNTER — Encounter: Payer: Self-pay | Admitting: *Deleted

## 2014-12-13 NOTE — Telephone Encounter (Signed)
Left detailed message for pt to call me back and schedule PPM and left a few date availabilities.  Will review with Dr. Caryl Comes next week

## 2014-12-13 NOTE — Telephone Encounter (Signed)
Follow Up ° ° ° °Pt is returning call from earlier. Please call back. °

## 2014-12-13 NOTE — Telephone Encounter (Signed)
Follow Up      Pt calling to speak with Sherri in regards to conversation yesterday.

## 2014-12-13 NOTE — Telephone Encounter (Signed)
New Message    Patient is returning nurses call , please call Thanks.

## 2014-12-13 NOTE — Telephone Encounter (Signed)
Scheduled PPM for 5/18. Pre procedure labs to be done at hospital. Wound check scheduled for 5/26. Reviewed procedure instructions with patient. Patient thanks me for all the help with this.

## 2014-12-14 NOTE — Telephone Encounter (Addendum)
Fax notification received for 3:11 AM with 4.4 sec. Pause. Pt called this morning LM to call back as we were checking on him.  Pt strips reviewed and signed by DOD.  Pt scheduled for PPM on 5/18

## 2014-12-16 ENCOUNTER — Telehealth: Payer: Self-pay | Admitting: Cardiology

## 2014-12-16 NOTE — Telephone Encounter (Signed)
Called by [facility] regarding heart monitor.  Heart rate noted to be 30bpm with second degree type I and II AV block.  Patient was sleeping at the time.  In reviewing chart, he has had multiple episodes of this at night while sleeping and is scheduled for PPM on 5/18.

## 2014-12-17 ENCOUNTER — Telehealth: Payer: Self-pay | Admitting: *Deleted

## 2014-12-17 DIAGNOSIS — H43813 Vitreous degeneration, bilateral: Secondary | ICD-10-CM | POA: Diagnosis not present

## 2014-12-17 DIAGNOSIS — H5203 Hypermetropia, bilateral: Secondary | ICD-10-CM | POA: Diagnosis not present

## 2014-12-17 DIAGNOSIS — H2513 Age-related nuclear cataract, bilateral: Secondary | ICD-10-CM | POA: Diagnosis not present

## 2014-12-17 DIAGNOSIS — H3411 Central retinal artery occlusion, right eye: Secondary | ICD-10-CM | POA: Diagnosis not present

## 2014-12-17 NOTE — Telephone Encounter (Signed)
Contacted the pt in regards to his event monitor on 5/15 at 0212 showing second degree av block type II, and second degree av block type I with a rate between 24-58 bpm, and the spouse answered the phone.  According to the pts wife he is completely asymptomatic with no cardiac complaints.  Pt currently at the grocery store then to an ophthalmologist appt for a routine eye exam.  Pt was asleep at the time of the recorded event.  Showed the DOD Dr Acie Fredrickson and no new orders obtained, because the pt will receive a PPM on 5/18 as scheduled by Dr Caryl Comes.  Will show Dr Caryl Comes this recording on return to the office this afternoon.

## 2014-12-19 ENCOUNTER — Encounter (HOSPITAL_COMMUNITY): Payer: Self-pay | Admitting: Internal Medicine

## 2014-12-19 ENCOUNTER — Encounter (HOSPITAL_COMMUNITY)
Admission: RE | Disposition: A | Discharge status: Home / Self Care | Payer: Medicare Other | Source: Ambulatory Visit | Attending: Internal Medicine

## 2014-12-19 ENCOUNTER — Ambulatory Visit (HOSPITAL_COMMUNITY)
Admission: RE | Admit: 2014-12-19 | Discharge: 2014-12-20 | Disposition: A | Discharge status: Home / Self Care | Payer: Medicare Other | Source: Ambulatory Visit | Attending: Internal Medicine | Admitting: Internal Medicine

## 2014-12-19 DIAGNOSIS — E785 Hyperlipidemia, unspecified: Secondary | ICD-10-CM | POA: Diagnosis not present

## 2014-12-19 DIAGNOSIS — I442 Atrioventricular block, complete: Secondary | ICD-10-CM | POA: Diagnosis not present

## 2014-12-19 DIAGNOSIS — K222 Esophageal obstruction: Secondary | ICD-10-CM | POA: Diagnosis not present

## 2014-12-19 DIAGNOSIS — I10 Essential (primary) hypertension: Secondary | ICD-10-CM | POA: Diagnosis not present

## 2014-12-19 DIAGNOSIS — Z959 Presence of cardiac and vascular implant and graft, unspecified: Secondary | ICD-10-CM

## 2014-12-19 DIAGNOSIS — M1990 Unspecified osteoarthritis, unspecified site: Secondary | ICD-10-CM | POA: Diagnosis not present

## 2014-12-19 DIAGNOSIS — Z7982 Long term (current) use of aspirin: Secondary | ICD-10-CM | POA: Insufficient documentation

## 2014-12-19 DIAGNOSIS — E780 Pure hypercholesterolemia: Secondary | ICD-10-CM | POA: Diagnosis not present

## 2014-12-19 DIAGNOSIS — J45909 Unspecified asthma, uncomplicated: Secondary | ICD-10-CM | POA: Insufficient documentation

## 2014-12-19 DIAGNOSIS — I495 Sick sinus syndrome: Secondary | ICD-10-CM | POA: Diagnosis not present

## 2014-12-19 DIAGNOSIS — R001 Bradycardia, unspecified: Secondary | ICD-10-CM | POA: Diagnosis present

## 2014-12-19 DIAGNOSIS — Z85828 Personal history of other malignant neoplasm of skin: Secondary | ICD-10-CM | POA: Diagnosis not present

## 2014-12-19 HISTORY — DX: Presence of cardiac pacemaker: Z95.0

## 2014-12-19 HISTORY — PX: INSERT / REPLACE / REMOVE PACEMAKER: SUR710

## 2014-12-19 HISTORY — DX: Retinal artery branch occlusion, unspecified eye: H34.239

## 2014-12-19 HISTORY — DX: Unspecified osteoarthritis, unspecified site: M19.90

## 2014-12-19 HISTORY — DX: Atrioventricular block, second degree: I44.1

## 2014-12-19 HISTORY — PX: EP IMPLANTABLE DEVICE: SHX172B

## 2014-12-19 HISTORY — DX: Gastro-esophageal reflux disease without esophagitis: K21.9

## 2014-12-19 HISTORY — DX: Cerebral infarction, unspecified: I63.9

## 2014-12-19 LAB — BASIC METABOLIC PANEL
Anion gap: 8 (ref 5–15)
BUN: 10 mg/dL (ref 6–20)
CO2: 26 mmol/L (ref 22–32)
Calcium: 10.2 mg/dL (ref 8.9–10.3)
Chloride: 106 mmol/L (ref 101–111)
Creatinine, Ser: 0.94 mg/dL (ref 0.61–1.24)
GFR calc Af Amer: >60 mL/min (ref >60)
GFR calc non Af Amer: >60 mL/min (ref >60)
Glucose, Bld: 91 mg/dL (ref 65–99)
Potassium: 3.6 mmol/L (ref 3.5–5.1)
Sodium: 140 mmol/L (ref 135–145)

## 2014-12-19 LAB — SURGICAL PCR SCREEN
MRSA, PCR: NEGATIVE
Staphylococcus aureus: POSITIVE — AB

## 2014-12-19 SURGERY — PACEMAKER IMPLANT
Anesthesia: LOCAL

## 2014-12-19 MED ORDER — CEFAZOLIN SODIUM-DEXTROSE 2-3 GM-% IV SOLR
2.0000 g | INTRAVENOUS | Status: AC
  Administered 2014-12-19: 2 g via INTRAVENOUS

## 2014-12-19 MED ORDER — SIMVASTATIN 40 MG PO TABS: 40.0000 mg | ORAL_TABLET | Freq: Every day | ORAL | Status: DC

## 2014-12-19 MED ORDER — CEFAZOLIN SODIUM 1-5 GM-% IV SOLN
1.0000 g | Freq: Four times a day (QID) | INTRAVENOUS | Status: AC
  Administered 2014-12-19 – 2014-12-20 (×3): 1 g via INTRAVENOUS
  Filled 2014-12-19 (×4): qty 50

## 2014-12-19 MED ORDER — SODIUM CHLORIDE 0.9 % IR SOLN
80.0000 mg | Status: AC
  Administered 2014-12-19: 80 mg
  Filled 2014-12-19: qty 2

## 2014-12-19 MED ORDER — DOXAZOSIN MESYLATE 4 MG PO TABS
4.0000 mg | ORAL_TABLET | Freq: Every day | ORAL | Status: DC
  Filled 2014-12-19: qty 1

## 2014-12-19 MED ORDER — SODIUM CHLORIDE 0.9 % IV SOLN
INTRAVENOUS | Status: DC
  Administered 2014-12-19: 13:00:00 via INTRAVENOUS

## 2014-12-19 MED ORDER — FENTANYL CITRATE (PF) 100 MCG/2ML IJ SOLN
INTRAMUSCULAR | Status: DC | PRN
  Administered 2014-12-19: 50 ug via INTRAVENOUS
  Administered 2014-12-19 (×2): 25 ug via INTRAVENOUS

## 2014-12-19 MED ORDER — CHLORHEXIDINE GLUCONATE 4 % EX LIQD
60.0000 mL | Freq: Once | CUTANEOUS | Status: DC
  Filled 2014-12-19: qty 60

## 2014-12-19 MED ORDER — CLOPIDOGREL BISULFATE 75 MG PO TABS
75.0000 mg | ORAL_TABLET | ORAL | Status: DC
  Filled 2014-12-19: qty 1

## 2014-12-19 MED ORDER — VITAMIN C 500 MG PO TABS
500.0000 mg | ORAL_TABLET | Freq: Every day | ORAL | Status: DC
  Filled 2014-12-19 (×2): qty 1

## 2014-12-19 MED ORDER — MUPIROCIN 2 % EX OINT
TOPICAL_OINTMENT | Freq: Once | CUTANEOUS | Status: AC
  Administered 2014-12-19: 13:00:00 via NASAL
  Filled 2014-12-19: qty 22

## 2014-12-19 MED ORDER — PANTOPRAZOLE SODIUM 40 MG PO TBEC
40.0000 mg | DELAYED_RELEASE_TABLET | Freq: Every day | ORAL | Status: DC
  Filled 2014-12-19: qty 1

## 2014-12-19 MED ORDER — AMLODIPINE BESYLATE 2.5 MG PO TABS
2.5000 mg | ORAL_TABLET | Freq: Every day | ORAL | Status: DC
  Filled 2014-12-19: qty 1

## 2014-12-19 MED ORDER — LIDOCAINE HCL (PF) 1 % IJ SOLN
INTRAMUSCULAR | Status: AC
  Filled 2014-12-19: qty 30

## 2014-12-19 MED ORDER — SODIUM CHLORIDE 0.9 % IV SOLN: INTRAVENOUS | Status: AC

## 2014-12-19 MED ORDER — FENTANYL CITRATE (PF) 100 MCG/2ML IJ SOLN
INTRAMUSCULAR | Status: AC
  Filled 2014-12-19: qty 2

## 2014-12-19 MED ORDER — ONDANSETRON HCL 4 MG/2ML IJ SOLN: 4.0000 mg | Freq: Four times a day (QID) | INTRAMUSCULAR | Status: DC | PRN

## 2014-12-19 MED ORDER — CHLORTHALIDONE 25 MG PO TABS
25.0000 mg | ORAL_TABLET | ORAL | Status: DC
  Filled 2014-12-19: qty 1

## 2014-12-19 MED ORDER — ATORVASTATIN CALCIUM 20 MG PO TABS
20.0000 mg | ORAL_TABLET | Freq: Every day | ORAL | Status: DC
  Administered 2014-12-19: 20 mg via ORAL
  Filled 2014-12-19 (×2): qty 1

## 2014-12-19 MED ORDER — MIDAZOLAM HCL 5 MG/5ML IJ SOLN
INTRAMUSCULAR | Status: DC | PRN
  Administered 2014-12-19 (×2): 1 mg via INTRAVENOUS
  Administered 2014-12-19: 2 mg via INTRAVENOUS

## 2014-12-19 MED ORDER — MIDAZOLAM HCL 5 MG/5ML IJ SOLN
INTRAMUSCULAR | Status: AC
  Filled 2014-12-19: qty 5

## 2014-12-19 MED ORDER — ASPIRIN EC 81 MG PO TBEC
81.0000 mg | DELAYED_RELEASE_TABLET | Freq: Every day | ORAL | Status: DC
  Filled 2014-12-19: qty 1

## 2014-12-19 MED ORDER — CEFAZOLIN SODIUM-DEXTROSE 2-3 GM-% IV SOLR
INTRAVENOUS | Status: AC
  Filled 2014-12-19: qty 50

## 2014-12-19 MED ORDER — MONTELUKAST SODIUM 10 MG PO TABS
10.0000 mg | ORAL_TABLET | Freq: Every day | ORAL | Status: DC
  Filled 2014-12-19: qty 1

## 2014-12-19 MED ORDER — MUPIROCIN 2 % EX OINT
TOPICAL_OINTMENT | CUTANEOUS | Status: AC
  Filled 2014-12-19: qty 22

## 2014-12-19 MED ORDER — ACETAMINOPHEN 325 MG PO TABS: 325.0000 mg | ORAL_TABLET | ORAL | Status: DC | PRN

## 2014-12-19 MED ORDER — HEPARIN (PORCINE) IN NACL 2-0.9 UNIT/ML-% IJ SOLN
INTRAMUSCULAR | Status: AC
  Filled 2014-12-19: qty 500

## 2014-12-19 SURGICAL SUPPLY — 8 items
CABLE SURGICAL S-101-97-12 (CABLE) ×2 IMPLANT
LEAD CAPSURE NOVUS 5076-52CM (Lead) ×1 IMPLANT
LEAD CAPSURE NOVUS 5076-58CM (Lead) ×1 IMPLANT
PAD DEFIB LIFELINK (PAD) ×2 IMPLANT
PPM ADVISA MRI DR A2DR01 (Pacemaker) ×1 IMPLANT
SHEATH CLASSIC 7F (SHEATH) ×2 IMPLANT
SYR 8ML ANGIOGRAPH HI-PRES (SYRINGE) ×1 IMPLANT
TRAY PACEMAKER INSERTION (CUSTOM PROCEDURE TRAY) ×2 IMPLANT

## 2014-12-19 NOTE — Discharge Summary (Signed)
ELECTROPHYSIOLOGY PROCEDURE DISCHARGE SUMMARY    Patient ID: Alexander Murray,  MRN: [id number], DOB/AGE: 02-20-1941 74 y.o.  Admit date: 12/19/2014 Discharge date: 12/20/2014  Primary Care Physician: Donnajean Lopes, MD Electrophysiologist: Caryl Comes  Primary Discharge Diagnosis:  Symptomatic high grade heart block status post pacemaker implantation this admission  Secondary Discharge Diagnosis:  1.  Hyperlipidemia 2.  Hypertension 3.  Osteoarthritis 4.  Esophageal stricture  No Known Allergies   Procedures This Admission:  1.  Implantation of a MDT dual chamber PPM on 12/19/14 by Dr Caryl Comes.  The patient received a MDT model number Advisa PPM with model number 5076 right atrial lead and 5076 right ventricular lead. There were no immediate post procedure complications. 2.  CXR on 12/20/14 demonstrated no pneumothorax status post device implantation.   Brief HPI: Alexander Murray is a 74 y.o. male was referred to electrophysiology in the outpatient setting for consideration of PPM implantation.  He has had symptomatic bradycardia  without reversible causes identified.  Risks, benefits, and alternatives to PPM implantation were reviewed with the patient who wished to proceed.   Hospital Course:  The patient was admitted and underwent implantation of a MDT dual chamber pacemaker with details as outlined above.  He  was monitored on telemetry overnight which demonstrated AV pacing.  Left chest was without hematoma or ecchymosis.  The device was interrogated and found to be functioning normally.  CXR was obtained and demonstrated no pneumothorax status post device implantation.  Wound care, arm mobility, and restrictions were reviewed with the patient.  The patient was examined and considered stable for discharge to home.    Physical Exam: Filed Vitals:   12/19/14 2000 12/19/14 2241 12/20/14 0030 12/20/14 0300  BP: 135/66 132/70 132/66 144/63  Pulse: 63 62 61 63  Temp: 98.1 F (36.7 C)  98.3 F (36.8 C) 98.1 F (36.7 C) 97.7 F (36.5 C)  TempSrc: Oral Oral Oral Oral  Resp:      Height:      Weight:      SpO2: 98% 96% 96% 98%   Well developed and nourished in no acute distress HENT normal Neck supple with JVP-flat Clear Pocket without hematoma Regular rate and rhythm, no murmurs or gallops Abd-soft with active BS No Clubbing cyanosis edema Skin-warm and dry A & Oriented  Grossly normal sensory and motor function   Labs:   Lab Results  Component Value Date   WBC 5.0 12/05/2014   HGB 12.4* 12/05/2014   HCT 37.5* 12/05/2014   MCV 78.2 12/05/2014   PLT 170.0 12/05/2014     Recent Labs Lab 12/19/14 1230  NA 140  K 3.6  CL 106  CO2 26  BUN 10  CREATININE 0.94  CALCIUM 10.2  GLUCOSE 91    Discharge Medications:    Medication List    TAKE these medications        amLODipine 2.5 MG tablet  Commonly known as:  NORVASC  Take 2.5 mg by mouth daily.     aspirin 81 MG tablet  Take 81 mg by mouth daily.     chlorthalidone 25 MG tablet  Commonly known as:  HYGROTON  Take 25 mg by mouth every other day.     clopidogrel 75 MG tablet  Commonly known as:  PLAVIX  Take 75 mg by mouth every other day.     doxazosin 8 MG tablet  Commonly known as:  CARDURA  Take 4 mg by mouth daily.  esomeprazole 20 MG capsule  Commonly known as:  NEXIUM  Take 20 mg by mouth daily at 12 noon.     GLUCOSAMINE CHONDROITIN ADV PO  Take 1,500 mg by mouth daily.     mometasone 50 MCG/ACT nasal spray  Commonly known as:  NASONEX  Place 2 sprays into the nose daily as needed (for cough).     montelukast 10 MG tablet  Commonly known as:  SINGULAIR  Take 10 mg by mouth every morning.     multivitamin capsule  Take 1 capsule by mouth daily.     simvastatin 40 MG tablet  Commonly known as:  ZOCOR  Take 40 mg by mouth daily.     vitamin C 500 MG tablet  Commonly known as:  ASCORBIC ACID  Take 500 mg by mouth daily.        Disposition:   Follow-up  Information    Follow up with [facility] On 01/02/2015.   Why:  at [facility] for wound check   Contact information:   [address] [place] [address]       Duration of Discharge Encounter: Greater than 30 minutes including physician time.  Signed, Chanetta Marshall, NP 12/20/2014 7:29 AM  insturxctions given  Plan reviewed

## 2014-12-19 NOTE — Discharge Instructions (Signed)
° ° °  Supplemental Discharge Instructions for  Pacemaker/Defibrillator Patients  Activity No heavy lifting or vigorous activity with your left/right arm for 6 to 8 weeks.  Do not raise your left/right arm above your head for one week.  Gradually raise your affected arm as drawn below.           __    12/23/14                      12/24/14                   12/25/14               12/26/14  NO DRIVING for  1 week   ; you may begin driving on  09/03/98   .  WOUND CARE - Keep the wound area clean and dry.  Do not get this area wet for one week. No showers for one week; you may shower on  12/26/14   . - The tape/steri-strips on your wound will fall off; do not pull them off.  No bandage is needed on the site.  DO  NOT apply any creams, oils, or ointments to the wound area. - If you notice any drainage or discharge from the wound, any swelling or bruising at the site, or you develop a fever > 101? F after you are discharged home, call the office at once.  Special Instructions - You are still able to use cellular telephones; use the ear opposite the side where you have your pacemaker/defibrillator.  Avoid carrying your cellular phone near your device. - When traveling through airports, show security personnel your identification card to avoid being screened in the metal detectors.  Ask the security personnel to use the hand wand. - Avoid arc welding equipment, MRI testing (magnetic resonance imaging), TENS units (transcutaneous nerve stimulators).  Call the office for questions about other devices. - Avoid electrical appliances that are in poor condition or are not properly grounded. - Microwave ovens are safe to be near or to operate.

## 2014-12-19 NOTE — Interval H&P Note (Signed)
History and Physical Interval Note:  12/19/2014 1:21 PM  Alexander Murray  has presented today for surgery, with the diagnosis of Heart Block  The various methods of treatment have been discussed with the patient and family. After consideration of risks, benefits and other options for treatment, the patient has consented to  Procedure(s): Pacemaker Implant (N/A) as a surgical intervention .  The patient's history has been reviewed, patient examined, no change in status, stable for surgery.  I have reviewed the patient's chart and labs.  Questions were answered to the patient's satisfaction.     [name]  Pt with spells of lightheadedness and weakness and HR 20s   Will proceed with pacing

## 2014-12-19 NOTE — H&P (View-Only) (Signed)
      Patient Care Team: [name], MD as PCP - General (Internal Medicine)   HPI  Alexander Murray is a 74 y.o. male Seen following an echocardiogram today. He was noted to have a heart rate in the low 30s.  He was seen a couple of weeks ago with first-degree AV block and some episodes of lightheadedness. It was not clear how these might be related. He is moving around as a left bundle  Reflecting, it is his impression that there are times where he has much less energy than others. He wonders whether this doesn't correlate with his heart rate of 30s as he feels that same way today.  Past Medical History  Diagnosis Date  . Palpitations   . Hypercholesteremia   . Cardiac arrhythmia   . Skin cancer   . Esophageal stricture   . Hypertension   . Asthma   . Hypercalcemia   . Hemochromatosis   . Osteoarthritis     Past Surgical History  Procedure Laterality Date  . Hernia repair    . Knee surgery Right     x2  . Colonoscopy    . Upper gastrointestinal endoscopy    . Ankle surgery      Current Outpatient Prescriptions  Medication Sig Dispense Refill  . amLODipine (NORVASC) 2.5 MG tablet Take 2.5 mg by mouth daily.    Marland Kitchen aspirin 81 MG tablet Take 81 mg by mouth daily.    . chlorthalidone (HYGROTON) 25 MG tablet Take 25 mg by mouth every other day.     . clopidogrel (PLAVIX) 75 MG tablet Take 75 mg by mouth every other day.    . doxazosin (CARDURA) 8 MG tablet Take 4 mg by mouth daily.     Marland Kitchen esomeprazole (NEXIUM) 20 MG capsule Take 20 mg by mouth daily at 12 noon.    . Misc Natural Products (GLUCOSAMINE CHONDROITIN ADV PO) Take 1,500 mg by mouth daily.    . mometasone (NASONEX) 50 MCG/ACT nasal spray Place 2 sprays into the nose daily as needed (for cough).     . montelukast (SINGULAIR) 10 MG tablet     . Multiple Vitamin (MULTIVITAMIN) capsule Take 1 capsule by mouth daily.    . simvastatin (ZOCOR) 40 MG tablet Take 40 mg by mouth daily.    . vitamin C (ASCORBIC ACID)  500 MG tablet Take by mouth daily.     No current facility-administered medications for this visit.    No Known Allergies  Review of Systems negative except from HPI and PMH  Physical Exam  110/54 Slow and irregular   rate and rhythm, no murmurs gallops or rub Soft with active bowel sounds No clubbing cyanosis none Edema Alert and oriented, grossly normal motor and sensory function Skin Warm and Dry  ECG demonstrates sinus rhythm at 60 with 2-1 block and occasional 3-2 heart block  Assessment and  Plan  Sinus bradycardia  High-grade 2:1 and 3:2 heart block  Lightheadedness and exercise intolerance  We will pursue a discrete 40 to try to identify whether there is an antiarrhythmic correlation between these episodes of presyncope and his heart rhythm. He will also work on trying to determine whether his symptoms really do correlate with slower rather than faster heart rates

## 2014-12-20 ENCOUNTER — Ambulatory Visit (HOSPITAL_COMMUNITY): Payer: Medicare Other

## 2014-12-20 DIAGNOSIS — Z950 Presence of cardiac pacemaker: Secondary | ICD-10-CM | POA: Diagnosis not present

## 2014-12-20 DIAGNOSIS — E780 Pure hypercholesterolemia: Secondary | ICD-10-CM | POA: Diagnosis not present

## 2014-12-20 DIAGNOSIS — I442 Atrioventricular block, complete: Secondary | ICD-10-CM

## 2014-12-20 DIAGNOSIS — I517 Cardiomegaly: Secondary | ICD-10-CM | POA: Diagnosis not present

## 2014-12-20 DIAGNOSIS — M1990 Unspecified osteoarthritis, unspecified site: Secondary | ICD-10-CM | POA: Diagnosis not present

## 2014-12-20 DIAGNOSIS — K222 Esophageal obstruction: Secondary | ICD-10-CM | POA: Diagnosis not present

## 2014-12-20 DIAGNOSIS — E785 Hyperlipidemia, unspecified: Secondary | ICD-10-CM | POA: Diagnosis not present

## 2014-12-20 DIAGNOSIS — I10 Essential (primary) hypertension: Secondary | ICD-10-CM | POA: Diagnosis not present

## 2014-12-20 IMAGING — CR DG CHEST 2V
2 series · 2 of 2 positions shown · non-contrast
Comparison: None.

CLINICAL DATA: Pacemaker.

EXAM:
CHEST  2 VIEW

[chest pa]
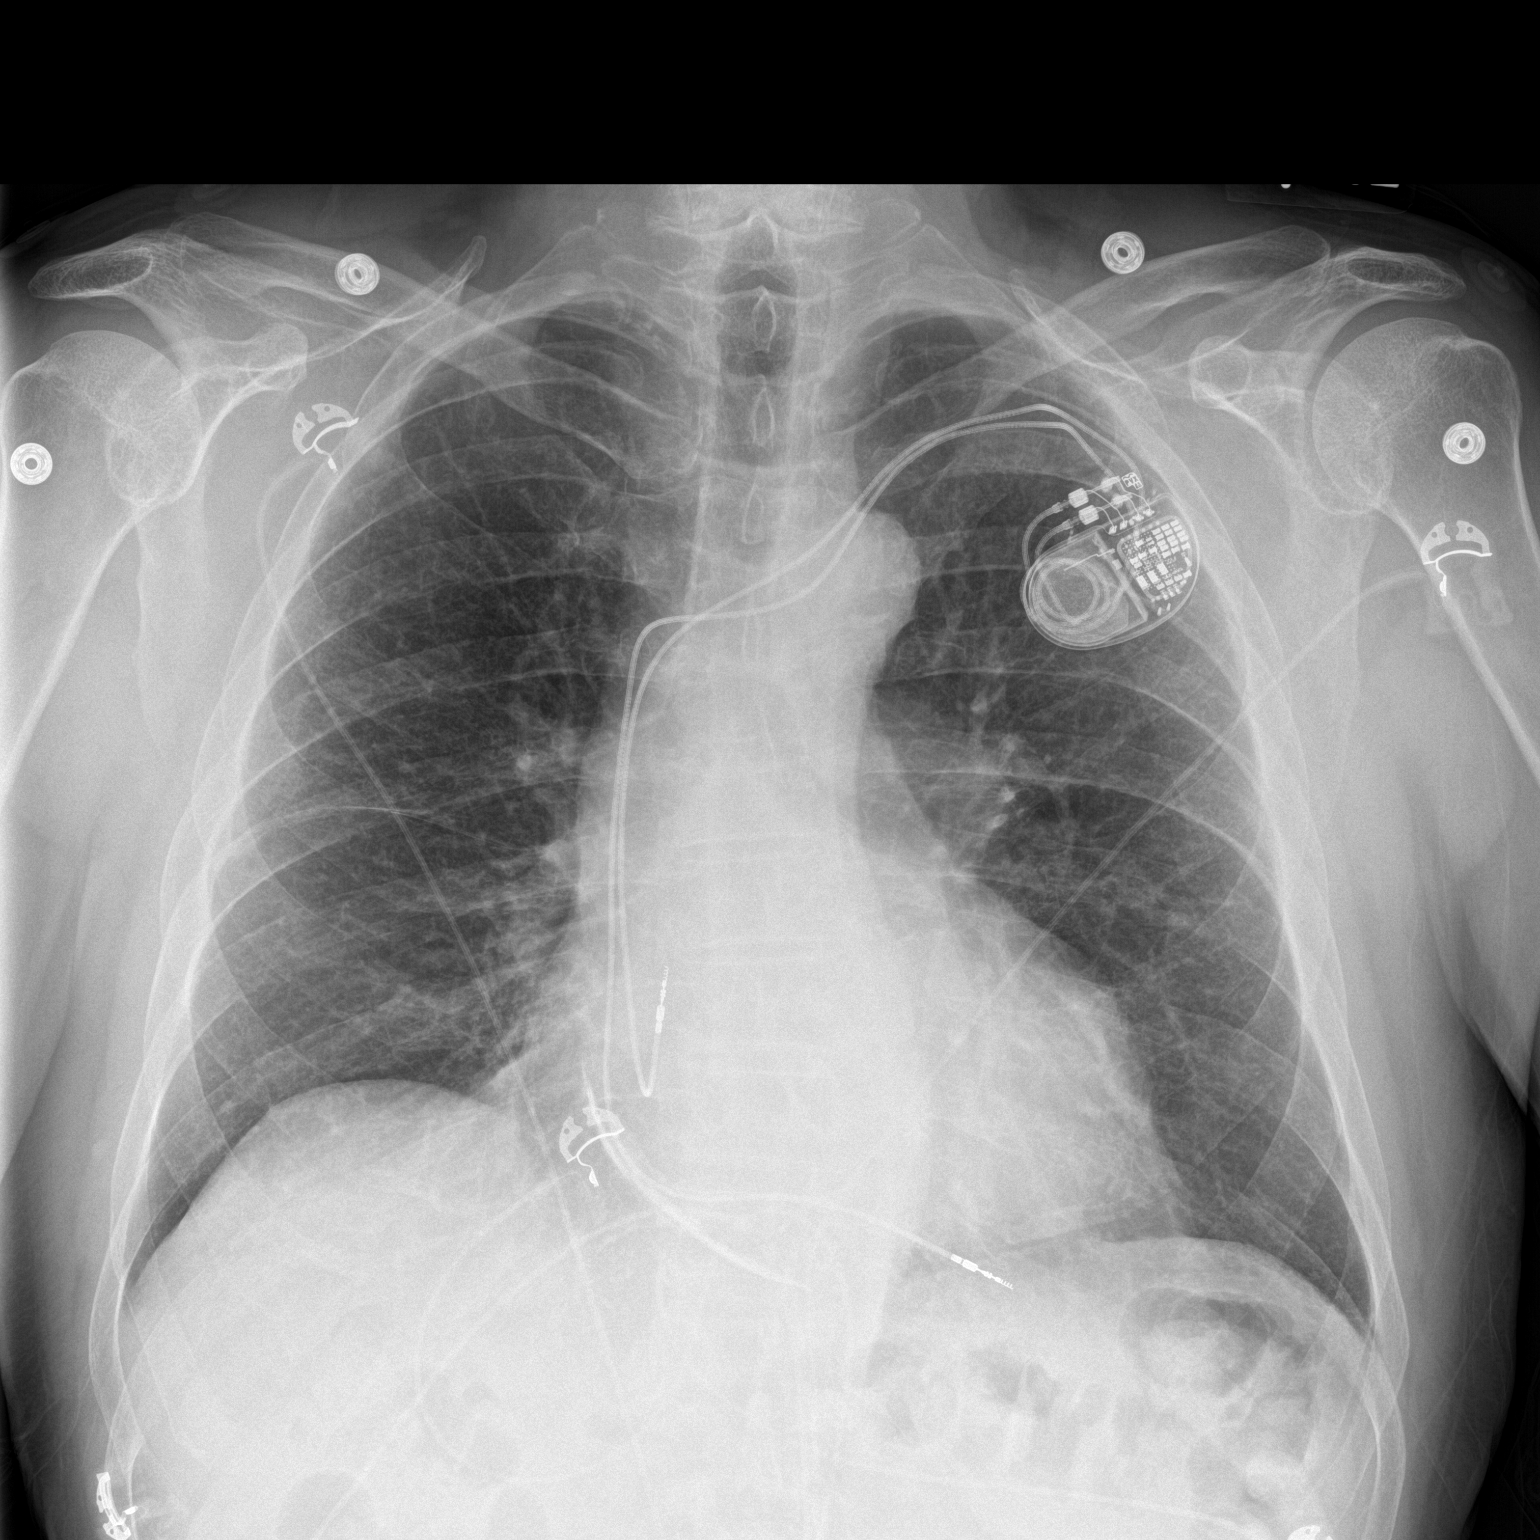

[chest lat]
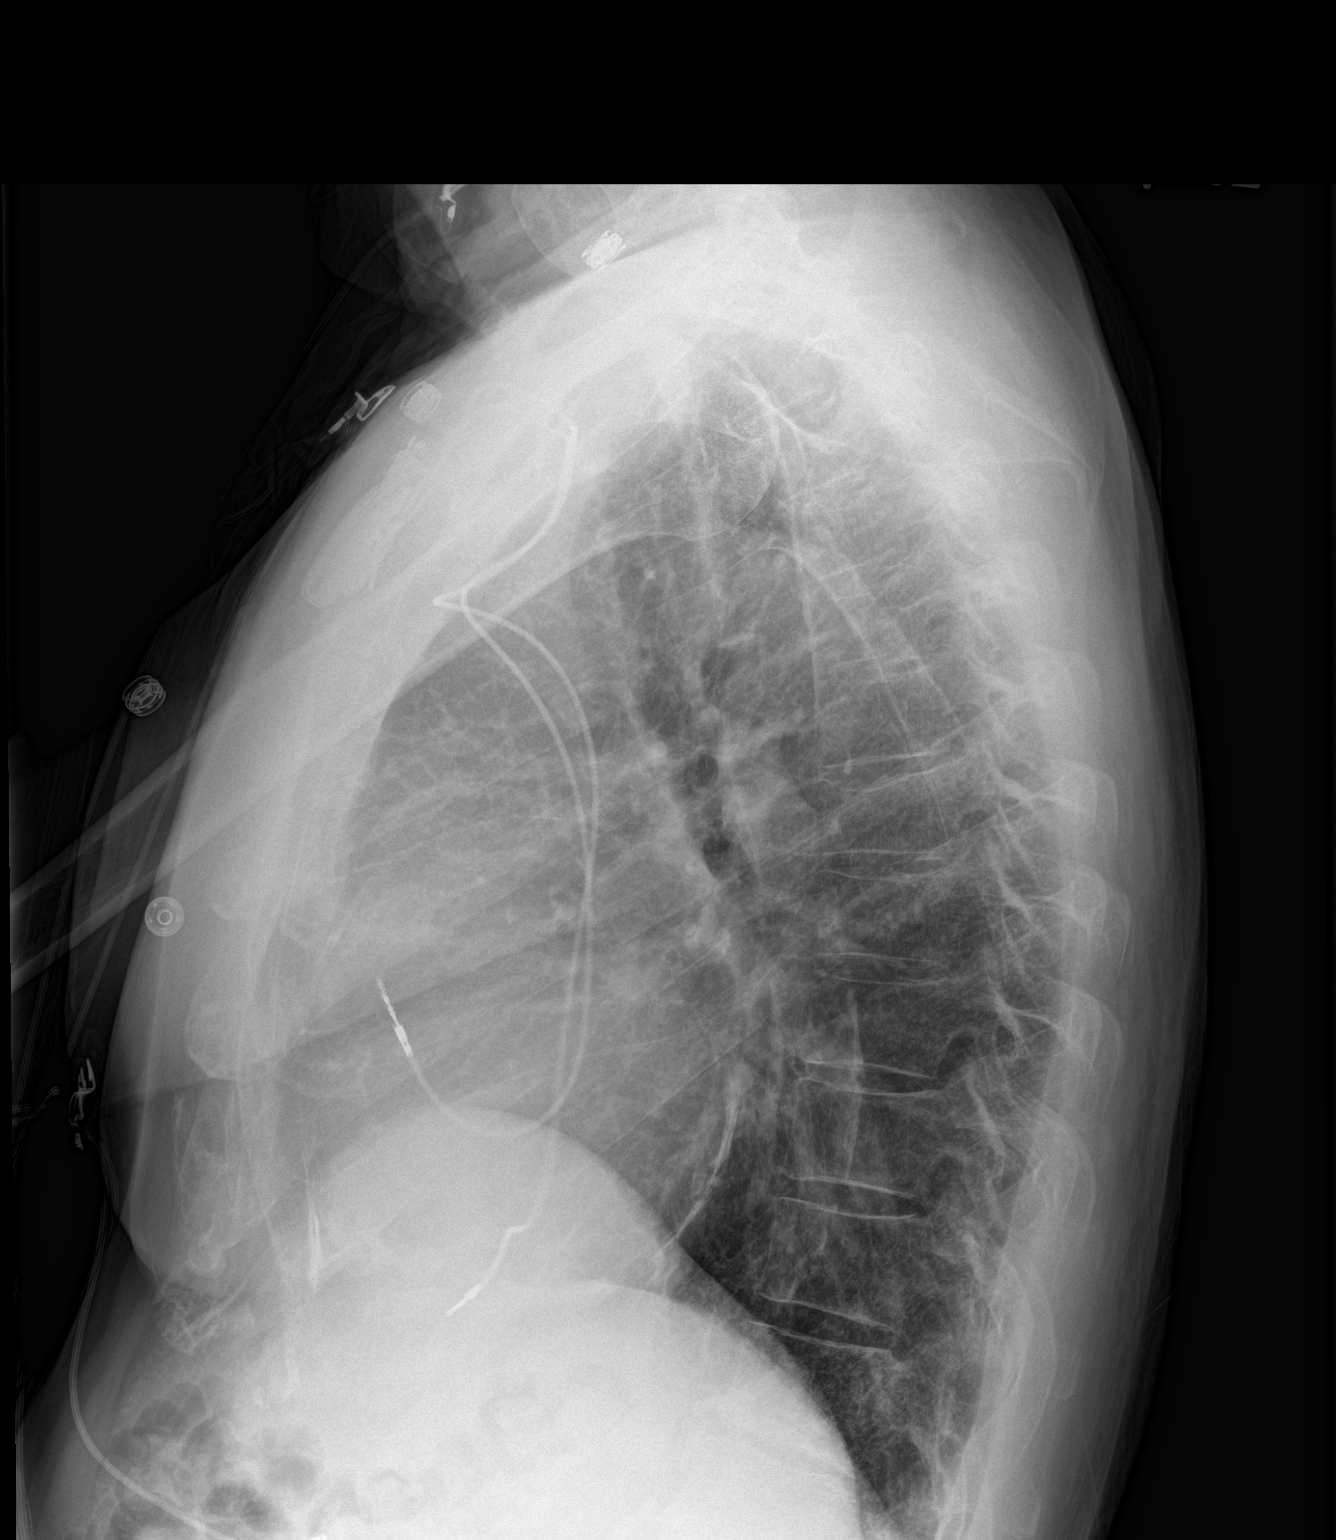

[2 of 2 positions shown; findings below may reference images not displayed]

FINDINGS: Mediastinum and hilar structures normal. Cardiac monitor leads noted
over the chest . Cardiac pacer with lead tips in right atrium and
right ventricle. Cardiomegaly with normal pulmonary vascularity.
Pericardial calcification consistent with prior pericarditis noted.
Questionable nodule right upper lobe. This may be secondary to
overlying cardiac monitor lead. No focal infiltrate. No pleural
effusion or pneumothorax. No acute bony abnormality.
IMPRESSION: 1. Cardiac pacer noted with lead tips in right atrium right
ventricle. Cardiomegaly with normal pulmonary vascularity.
Pericardial calcification consistent with prior pericarditis noted.
2. Questionable pulmonary nodule right upper lobe. This may
represent overlying cardiac monitor lead. Repeat PA and lateral
chest x-ray without overlying leads suggested .

## 2014-12-20 MED FILL — Lidocaine HCl Local Preservative Free (PF) Inj 1%: INTRAMUSCULAR | Qty: 60 | Status: AC

## 2014-12-20 MED FILL — Heparin Sodium (Porcine) 2 Unit/ML in Sodium Chloride 0.9%: INTRAMUSCULAR | Qty: 500 | Status: AC

## 2014-12-20 NOTE — Progress Notes (Signed)
Pt/family given discharge instructions, medication lists, follow up appointments, and when to call the doctor.  Pt/family verbalizes understanding. Pt given signs and symptoms of infection. Claude Waldman McClintock, RN    

## 2015-01-02 ENCOUNTER — Ambulatory Visit: Payer: Medicare Other

## 2015-01-04 ENCOUNTER — Ambulatory Visit (INDEPENDENT_AMBULATORY_CARE_PROVIDER_SITE_OTHER): Payer: Medicare Other | Admitting: *Deleted

## 2015-01-04 ENCOUNTER — Encounter: Payer: Self-pay | Admitting: Internal Medicine

## 2015-01-04 DIAGNOSIS — R001 Bradycardia, unspecified: Secondary | ICD-10-CM

## 2015-01-04 LAB — CUP PACEART INCLINIC DEVICE CHECK
Battery Remaining Longevity: 109 mo
Battery Voltage: 3.08 V
Brady Statistic AP VP Percent: 63.69 %
Brady Statistic AP VS Percent: 0.17 %
Brady Statistic AS VP Percent: 35.01 %
Brady Statistic AS VS Percent: 1.13 %
Brady Statistic RA Percent Paced: 63.86 %
Brady Statistic RV Percent Paced: 98.71 %
Date Time Interrogation Session: 20160603161536
Lead Channel Impedance Value: 361 Ohm
Lead Channel Impedance Value: 456 Ohm
Lead Channel Impedance Value: 627 Ohm
Lead Channel Impedance Value: 684 Ohm
Lead Channel Pacing Threshold Amplitude: 0.5 V
Lead Channel Pacing Threshold Amplitude: 0.5 V
Lead Channel Pacing Threshold Pulse Width: 0.4 ms
Lead Channel Pacing Threshold Pulse Width: 0.4 ms
Lead Channel Sensing Intrinsic Amplitude: 10.375 mV
Lead Channel Sensing Intrinsic Amplitude: 11.5 mV
Lead Channel Sensing Intrinsic Amplitude: 4.375 mV
Lead Channel Sensing Intrinsic Amplitude: 4.5 mV
Lead Channel Setting Pacing Amplitude: 3.5 V
Lead Channel Setting Pacing Amplitude: 3.5 V
Lead Channel Setting Pacing Pulse Width: 0.4 ms
Lead Channel Setting Sensing Sensitivity: 2 mV
Zone Setting Detection Interval: 400 ms
Zone Setting Detection Interval: 400 ms

## 2015-01-04 NOTE — Progress Notes (Signed)
Wound check appointment. Steri-strips removed. Wound without redness or edema. Incision edges approximated, wound well healed. Normal device function. Thresholds, sensing, and impedances consistent with implant measurements. SIC=0. Device programmed at 3.5V for extra safety margin until 3 month visit. Histogram distribution appropriate for patient and level of activity. No AT/AF episodes. (1) ventricular high rate episode noted x <1 sec '@103'$ /316---?noise per markers. Patient educated about wound care, arm mobility, lifting restrictions. ROV on 6/8 w/SK as scheduled.

## 2015-01-09 ENCOUNTER — Ambulatory Visit: Payer: Medicare Other | Admitting: Internal Medicine

## 2015-02-20 DIAGNOSIS — Z9889 Other specified postprocedural states: Secondary | ICD-10-CM | POA: Diagnosis not present

## 2015-02-20 DIAGNOSIS — M25561 Pain in right knee: Secondary | ICD-10-CM | POA: Diagnosis not present

## 2015-02-20 DIAGNOSIS — M1711 Unilateral primary osteoarthritis, right knee: Secondary | ICD-10-CM | POA: Diagnosis not present

## 2015-02-27 ENCOUNTER — Telehealth: Payer: Self-pay | Admitting: *Deleted

## 2015-02-27 NOTE — Telephone Encounter (Signed)
Informed patient that clearance request from [facility] given to medical records to fax. Reviewed reasoning he was on Plavix -- he explained he was placed on this med about 6 yrs ago secondary to retinal artery branch occlusion.  We do not need to address this as it is not cardiology related medication.

## 2015-02-27 NOTE — Telephone Encounter (Signed)
Follow up    Pt returning your call. Pt will be available until 3:30

## 2015-02-27 NOTE — Telephone Encounter (Signed)
lmtcb

## 2015-04-04 ENCOUNTER — Encounter: Payer: Self-pay | Admitting: Internal Medicine

## 2015-04-04 ENCOUNTER — Ambulatory Visit (INDEPENDENT_AMBULATORY_CARE_PROVIDER_SITE_OTHER): Payer: Medicare Other | Admitting: Internal Medicine

## 2015-04-04 VITALS — BP 120/78 | HR 68 | Ht 70.5 in | Wt 171.8 lb

## 2015-04-04 DIAGNOSIS — Z45018 Encounter for adjustment and management of other part of cardiac pacemaker: Secondary | ICD-10-CM | POA: Diagnosis not present

## 2015-04-04 DIAGNOSIS — R001 Bradycardia, unspecified: Secondary | ICD-10-CM

## 2015-04-04 DIAGNOSIS — I441 Atrioventricular block, second degree: Secondary | ICD-10-CM

## 2015-04-04 LAB — CUP PACEART INCLINIC DEVICE CHECK
Battery Remaining Longevity: 122 mo
Battery Voltage: 3.04 V
Brady Statistic AP VP Percent: 62.76 %
Brady Statistic AP VS Percent: 0.04 %
Brady Statistic AS VP Percent: 36.92 %
Brady Statistic AS VS Percent: 0.28 %
Brady Statistic RA Percent Paced: 62.81 %
Brady Statistic RV Percent Paced: 99.68 %
Date Time Interrogation Session: 20160901175406
Lead Channel Impedance Value: 361 Ohm
Lead Channel Impedance Value: 532 Ohm
Lead Channel Impedance Value: 760 Ohm
Lead Channel Impedance Value: 855 Ohm
Lead Channel Pacing Threshold Amplitude: 0.5 V
Lead Channel Pacing Threshold Amplitude: 0.75 V
Lead Channel Pacing Threshold Pulse Width: 0.4 ms
Lead Channel Pacing Threshold Pulse Width: 0.4 ms
Lead Channel Sensing Intrinsic Amplitude: 3.875 mV
Lead Channel Sensing Intrinsic Amplitude: 9 mV
Lead Channel Setting Pacing Amplitude: 1.5 V
Lead Channel Setting Pacing Amplitude: 2.5 V
Lead Channel Setting Pacing Pulse Width: 0.4 ms
Lead Channel Setting Sensing Sensitivity: 4 mV
Zone Setting Detection Interval: 400 ms
Zone Setting Detection Interval: 400 ms

## 2015-04-04 NOTE — Progress Notes (Signed)
Patient Care Team: Alexander Battles, MD as PCP - General (Internal Medicine)   HPI  Alexander Murray is a 74 y.o. male Seen in follow-up for symptomatic high-grade heart block. He underwent pacing.  He was seen a couple of weeks ago with first-degree AV block and some episodes of lightheadedness. It was not clear how these might be related. He is moving around as a left bundle  He is vastly improved following device implantation. There is no further dizziness. Exercise tolerance is largely resolved.  He is anticipating knee surgery in a few months. He has a history of prior retinal artery occlusion and no source was ever identified. He is on aspirin and Plavix which he takes every other day.Alexander Murray 2016 Low risk stress nuclear study with small, mild, fixed anteroseptal and inferior defects (possible soft tissue attenuation); no ischemia; note patient in type 1 second degree AV block on baseline ECG; HR increased to 127 with exercise; Mobitz 1, 2:1 AV block and occasional junctional beats in recovery; patient seen by Dr Harrington Challenger prior to leaving the office.  Past Medical History  Diagnosis Date  . Palpitations   . Hypercholesteremia   . Skin cancer   . Esophageal stricture   . Hypertension   . Asthma   . Hypercalcemia   . Hemochromatosis   . Presence of permanent cardiac pacemaker   . Stroke ~ 2011    "left eye"  . Retinal artery occlusion, branch     "right eye"  . Second degree AV block, Mobitz type II   . GERD (gastroesophageal reflux disease)   . Osteoarthritis   . Arthritis     "right knee and right thumb" (12/19/2014)    Past Surgical History  Procedure Laterality Date  . Knee arthroscopy Right ~ 1982; ~ 1992  . Colonoscopy    . Upper gastrointestinal endoscopy    . Posterior tibial tendon repair Left 2012  . Ep implantable device N/A 12/19/2014    Procedure: Pacemaker Implant;  Surgeon: Deboraha Sprang, MD;  Location: [facility];  Service:  Cardiovascular;  Laterality: N/A;  . Insert / replace / remove pacemaker  12/19/2014  . Tonsillectomy  1946  . Inguinal hernia repair Left 1980's  . Mohs surgery Right ~ 2014    "pre-melanoma scapula"  . Esophagogastroduodenoscopy (egd) with esophageal dilation  X 3    Current Outpatient Prescriptions  Medication Sig Dispense Refill  . amLODipine (NORVASC) 2.5 MG tablet Take 2.5 mg by mouth daily.    Marland Kitchen aspirin 81 MG tablet Take 81 mg by mouth daily.    . chlorthalidone (HYGROTON) 25 MG tablet Take 25 mg by mouth every other day.     . clopidogrel (PLAVIX) 75 MG tablet Take 75 mg by mouth every other day.    . doxazosin (CARDURA) 8 MG tablet Take 4 mg by mouth daily.     Marland Kitchen esomeprazole (NEXIUM) 20 MG capsule Take 20 mg by mouth daily.     . Misc Natural Products (GLUCOSAMINE CHONDROITIN ADV PO) Take 1,500 mg by mouth daily.    . mometasone (NASONEX) 50 MCG/ACT nasal spray Place 2 sprays into the nose daily as needed (for cough).     . montelukast (SINGULAIR) 10 MG tablet Take 10 mg by mouth every morning.     . Multiple Vitamin (MULTIVITAMIN) capsule Take 1 capsule by mouth daily.    . simvastatin (ZOCOR) 40 MG tablet Take 40 mg by mouth daily.    Marland Kitchen  vitamin C (ASCORBIC ACID) 500 MG tablet Take 500 mg by mouth daily.      No current facility-administered medications for this visit.    No Known Allergies  Review of Systems negative except from HPI and PMH  Physical Exam   BP 120/78 mmHg  Pulse 68  Ht 5' 10.5" (1.791 m)  Wt 171 lb 12.8 oz (77.928 kg)  BMI 24.29 kg/m2 Device pocket well healed; without hematoma or erythema.  There is no tethering  Slow and irregular   rate and rhythm, no murmurs gallops or rub Soft with active bowel sounds No clubbing cyanosis none Edema Alert and oriented, grossly normal motor and sensory function Skin Warm and Dry  ECG  sinus at 68 with Psynchchornous  pacing  Assessment and  Plan  Sinus bradycardia  Preoperative cardiovascular eval  prior to the surgeryuation  High-grade 2:1 and 3:2 heart block  Pacemaker-[employer]   The patient's device was interrogated.  The information was reviewed. device was reprogrammed to maximize longevity  He is much improved following device implantation. Heart rate excursion is reasonable. We will see him again in 9 months.    risk for surgery acceptable. We will discontinue his plavis prior to surgery

## 2015-04-04 NOTE — Patient Instructions (Addendum)
Medication Instructions:  Your physician recommends that you continue on your current medications as directed. Please refer to the Current Medication list given to you today.  Labwork: None ordered  Testing/Procedures: None ordered  Follow-Up: Remote monitoring is used to monitor your Pacemaker of ICD from home. This monitoring reduces the number of office visits required to check your device to one time per year. It allows [place] to keep an eye on the functioning of your device to ensure it is working properly. You are scheduled for a device check from home on 07/04/15. You may send your transmission at any time that day. If you have a wireless device, the transmission will be sent automatically. After your physician reviews your transmission, you will receive a postcard with your next transmission date.  Your physician wants you to follow-up in: 9 months with Dr. Caryl Comes.  You will receive a reminder letter in the mail two months in advance. If you don't receive a letter, please call our office to schedule the follow-up appointment.  Any Other Special Instructions Will Be Listed Below (If Applicable). Thank you for choosing [facility]!!   Trinidad Curet, RN [phone]

## 2015-04-11 NOTE — Addendum Note (Signed)
Addended by: Freada Bergeron on: 04/11/2015 05:03 PM   Modules accepted: Orders

## 2015-04-15 DIAGNOSIS — Z23 Encounter for immunization: Secondary | ICD-10-CM | POA: Diagnosis not present

## 2015-04-16 DIAGNOSIS — Z23 Encounter for immunization: Secondary | ICD-10-CM | POA: Diagnosis not present

## 2015-04-22 ENCOUNTER — Encounter: Payer: Self-pay | Admitting: Internal Medicine

## 2015-04-24 DIAGNOSIS — M1711 Unilateral primary osteoarthritis, right knee: Secondary | ICD-10-CM | POA: Diagnosis not present

## 2015-05-14 ENCOUNTER — Encounter: Payer: Self-pay | Admitting: Gastroenterology

## 2015-05-16 NOTE — Patient Instructions (Addendum)
YOUR PROCEDURE IS SCHEDULED ON :  05/20/15  REPORT TO [facility] MAIN ENTRANCE FOLLOW SIGNS TO EAST ELEVATOR - GO TO 3rd FLOOR CHECK IN AT 3 EAST NURSES STATION (SHORT STAY) AT:   6:00 AM  CALL THIS NUMBER IF YOU HAVE PROBLEMS THE MORNING OF SURGERY [phone]  REMEMBER:ONLY 1 PER PERSON MAY GO TO SHORT STAY WITH YOU TO GET READY THE MORNING OF YOUR SURGERY  DO NOT EAT FOOD OR DRINK LIQUIDS AFTER MIDNIGHT  TAKE THESE MEDICINES THE MORNING OF SURGERY:  AMLODIPINE / NEXIUM / SINGULAIR / NASONEX IF NEEDED  YOU MAY NOT HAVE ANY METAL ON YOUR BODY INCLUDING HAIR PINS AND PIERCING'S. DO NOT WEAR JEWELRY, MAKEUP, LOTIONS, POWDERS OR PERFUMES. DO NOT WEAR NAIL POLISH. DO NOT SHAVE 48 HRS PRIOR TO SURGERY. MEN MAY SHAVE FACE AND NECK.  DO NOT [place]. [facility] IS NOT RESPONSIBLE FOR VALUABLES.  CONTACTS, DENTURES OR PARTIALS MAY NOT BE WORN TO SURGERY. LEAVE SUITCASE IN CAR. CAN BE BROUGHT TO ROOM AFTER SURGERY.  PATIENTS DISCHARGED THE DAY OF SURGERY WILL NOT BE ALLOWED TO DRIVE HOME.  PLEASE READ OVER THE FOLLOWING INSTRUCTION SHEETS _________________________________________________________________________________                                          [facility] - PREPARING FOR SURGERY  Before surgery, you can play an important role.  Because skin is not sterile, your skin needs to be as free of germs as possible.  You can reduce the number of germs on your skin by washing with CHG (chlorahexidine gluconate) soap before surgery.  CHG is an antiseptic cleaner which kills germs and bonds with the skin to continue killing germs even after washing. Please DO NOT use if you have an allergy to CHG or antibacterial soaps.  If your skin becomes reddened/irritated stop using the CHG and inform your nurse when you arrive at Short Stay. Do not shave (including legs and underarms) for at least 48 hours prior to the first CHG shower.  You may shave your  face. Please follow these instructions carefully:   1.  Shower with CHG Soap the night before surgery and the  morning of Surgery.   2.  If you choose to wash your hair, wash your hair first as usual with your  normal  Shampoo.   3.  After you shampoo, rinse your hair and body thoroughly to remove the  shampoo.                                         4.  Use CHG as you would any other liquid soap.  You can apply chg directly  to the skin and wash . Gently wash with scrungie or clean wascloth    5.  Apply the CHG Soap to your body ONLY FROM THE NECK DOWN.   Do not use on open                           Wound or open sores. Avoid contact with eyes, ears mouth and genitals (private parts).  Genitals (private parts) with your normal soap.              6.  Wash thoroughly, paying special attention to the area where your surgery  will be performed.   7.  Thoroughly rinse your body with warm water from the neck down.   8.  DO NOT shower/wash with your normal soap after using and rinsing off  the CHG Soap .                9.  Pat yourself dry with a clean towel.             10.  Wear clean night clothes to bed after shower             11.  Place clean sheets on your bed the night of your first shower and do not  sleep with pets.  Day of Surgery : Do not apply any lotions/deodorants the morning of surgery.  Please wear clean clothes to the hospital/surgery center.  FAILURE TO FOLLOW THESE INSTRUCTIONS MAY RESULT IN THE CANCELLATION OF YOUR SURGERY    PATIENT SIGNATURE_________________________________  ______________________________________________________________________     Alexander Murray  An incentive spirometer is a tool that can help keep your lungs clear and active. This tool measures how well you are filling your lungs with each breath. Taking long deep breaths may help reverse or decrease the chance of developing breathing (pulmonary) problems  (especially infection) following:  A long period of time when you are unable to move or be active. BEFORE THE PROCEDURE   If the spirometer includes an indicator to show your best effort, your nurse or respiratory therapist will set it to a desired goal.  If possible, sit up straight or lean slightly forward. Try not to slouch.  Hold the incentive spirometer in an upright position. INSTRUCTIONS FOR USE   Sit on the edge of your bed if possible, or sit up as far as you can in bed or on a chair.  Hold the incentive spirometer in an upright position.  Breathe out normally.  Place the mouthpiece in your mouth and seal your lips tightly around it.  Breathe in slowly and as deeply as possible, raising the piston or the ball toward the top of the column.  Hold your breath for 3-5 seconds or for as long as possible. Allow the piston or ball to fall to the bottom of the column.  Remove the mouthpiece from your mouth and breathe out normally.  Rest for a few seconds and repeat Steps 1 through 7 at least 10 times every 1-2 hours when you are awake. Take your time and take a few normal breaths between deep breaths.  The spirometer may include an indicator to show your best effort. Use the indicator as a goal to work toward during each repetition.  After each set of 10 deep breaths, practice coughing to be sure your lungs are clear. If you have an incision (the cut made at the time of surgery), support your incision when coughing by placing a pillow or rolled up towels firmly against it. Once you are able to get out of bed, walk around indoors and cough well. You may stop using the incentive spirometer when instructed by your caregiver.  RISKS AND COMPLICATIONS  Take your time so you do not get dizzy or light-headed.  If you are in pain, you may need to take or ask for pain medication before doing incentive spirometry. It is  harder to take a deep breath if you are having pain. AFTER  USE  Rest and breathe slowly and easily.  It can be helpful to keep track of a log of your progress. Your caregiver can provide you with a simple table to help with this. If you are using the spirometer at home, follow these instructions: [place] IF:   You are having difficultly using the spirometer.  You have trouble using the spirometer as often as instructed.  Your pain medication is not giving enough relief while using the spirometer.  You develop fever of 100.5 F (38.1 C) or higher. SEEK IMMEDIATE MEDICAL CARE IF:   You cough up bloody sputum that had not been present before.  You develop fever of 102 F (38.9 C) or greater.  You develop worsening pain at or near the incision site. MAKE SURE YOU:   Understand these instructions.  Will watch your condition.  Will get help right away if you are not doing well or get worse. Document Released: 11/30/2006 Document Revised: 10/12/2011 Document Reviewed: 01/31/2007 ExitCare Patient Information 2014 Exit[employer], [place].   ________________________________________________________________________  WHAT IS A BLOOD TRANSFUSION? Blood Transfusion Information  A transfusion is the replacement of blood or some of its parts. Blood is made up of multiple cells which provide different functions.  Red blood cells carry oxygen and are used for blood loss replacement.  White blood cells fight against infection.  Platelets control bleeding.  Plasma helps clot blood.  Other blood products are available for specialized needs, such as hemophilia or other clotting disorders. BEFORE THE TRANSFUSION  Who gives blood for transfusions?   Healthy volunteers who are fully evaluated to make sure their blood is safe. This is blood bank blood. Transfusion therapy is the safest it has ever been in the practice of medicine. Before blood is taken from a donor, a complete history is taken to make sure that person has no history of diseases  nor engages in risky social behavior (examples are intravenous drug use or sexual activity with multiple partners). The donor's travel history is screened to minimize risk of transmitting infections, such as malaria. The donated blood is tested for signs of infectious diseases, such as HIV and hepatitis. The blood is then tested to be sure it is compatible with you in order to minimize the chance of a transfusion reaction. If you or a relative donates blood, this is often done in anticipation of surgery and is not appropriate for emergency situations. It takes many days to process the donated blood. RISKS AND COMPLICATIONS Although transfusion therapy is very safe and saves many lives, the main dangers of transfusion include:   Getting an infectious disease.  Developing a transfusion reaction. This is an allergic reaction to something in the blood you were given. Every precaution is taken to prevent this. The decision to have a blood transfusion has been considered carefully by your caregiver before blood is given. Blood is not given unless the benefits outweigh the risks. AFTER THE TRANSFUSION  Right after receiving a blood transfusion, you will usually feel much better and more energetic. This is especially true if your red blood cells have gotten low (anemic). The transfusion raises the level of the red blood cells which carry oxygen, and this usually causes an energy increase.  The nurse administering the transfusion will monitor you carefully for complications. HOME CARE INSTRUCTIONS  No special instructions are needed after a transfusion. You may find your energy is better. Speak  with your caregiver about any limitations on activity for underlying diseases you may have. SEEK MEDICAL CARE IF:   Your condition is not improving after your transfusion.  You develop redness or irritation at the intravenous (IV) site. SEEK IMMEDIATE MEDICAL CARE IF:  Any of the following symptoms occur over the  next 12 hours:  Shaking chills.  You have a temperature by mouth above 102 F (38.9 C), not controlled by medicine.  Chest, back, or muscle pain.  People around you feel you are not acting correctly or are confused.  Shortness of breath or difficulty breathing.  Dizziness and fainting.  You get a rash or develop hives.  You have a decrease in urine output.  Your urine turns a dark color or changes to pink, red, or brown. Any of the following symptoms occur over the next 10 days:  You have a temperature by mouth above 102 F (38.9 C), not controlled by medicine.  Shortness of breath.  Weakness after normal activity.  The white part of the eye turns yellow (jaundice).  You have a decrease in the amount of urine or are urinating less often.  Your urine turns a dark color or changes to pink, red, or brown. Document Released: 07/17/2000 Document Revised: 10/12/2011 Document Reviewed: 03/05/2008 [facility] Patient Information 2014 [place], [place].  _______________________________________________________________________

## 2015-05-17 ENCOUNTER — Encounter (HOSPITAL_COMMUNITY)
Admission: RE | Admit: 2015-05-17 | Discharge: 2015-05-17 | Disposition: A | Discharge status: Home / Self Care | Payer: Medicare Other | Source: Ambulatory Visit | Attending: Orthopedic Surgery | Admitting: Orthopedic Surgery

## 2015-05-17 ENCOUNTER — Encounter (HOSPITAL_COMMUNITY): Payer: Self-pay

## 2015-05-17 LAB — URINALYSIS, ROUTINE W REFLEX MICROSCOPIC
Bilirubin Urine: NEGATIVE
Glucose, UA: NEGATIVE mg/dL
Hgb urine dipstick: NEGATIVE
Ketones, ur: NEGATIVE mg/dL
Leukocytes, UA: NEGATIVE
Nitrite: NEGATIVE
Protein, ur: NEGATIVE mg/dL
Specific Gravity, Urine: 1.009 (ref 1.005–1.030)
Urobilinogen, UA: 1 mg/dL (ref 0.0–1.0)
pH: 7.5 (ref 5.0–8.0)

## 2015-05-17 LAB — BASIC METABOLIC PANEL
Anion gap: 6 (ref 5–15)
BUN: 12 mg/dL (ref 6–20)
CO2: 28 mmol/L (ref 22–32)
Calcium: 10.1 mg/dL (ref 8.9–10.3)
Chloride: 106 mmol/L (ref 101–111)
Creatinine, Ser: 0.89 mg/dL (ref 0.61–1.24)
GFR calc Af Amer: >60 mL/min (ref >60)
GFR calc non Af Amer: >60 mL/min (ref >60)
Glucose, Bld: 95 mg/dL (ref 65–99)
Potassium: 4.2 mmol/L (ref 3.5–5.1)
Sodium: 140 mmol/L (ref 135–145)

## 2015-05-17 LAB — SURGICAL PCR SCREEN
MRSA, PCR: NEGATIVE
Staphylococcus aureus: NEGATIVE

## 2015-05-17 LAB — PROTIME-INR
INR: 1.02 (ref 0.00–1.49)
Prothrombin Time: 13.6 seconds (ref 11.6–15.2)

## 2015-05-17 LAB — CBC
HCT: 41.9 % (ref 39.0–52.0)
Hemoglobin: 13.6 g/dL (ref 13.0–17.0)
MCH: 28.8 pg (ref 26.0–34.0)
MCHC: 32.5 g/dL (ref 30.0–36.0)
MCV: 88.6 fL (ref 78.0–100.0)
Platelets: 168 10*3/uL (ref 150–400)
RBC: 4.73 MIL/uL (ref 4.22–5.81)
RDW: 14.5 % (ref 11.5–15.5)
WBC: 4.1 10*3/uL (ref 4.0–10.5)

## 2015-05-17 LAB — APTT: aPTT: 32 seconds (ref 24–37)

## 2015-05-17 LAB — ABO/RH: ABO/RH(D): O POS

## 2015-05-17 NOTE — H&P (Signed)
TOTAL KNEE ADMISSION H&P  Patient is being admitted for right total knee arthroplasty.  Subjective:  Chief Complaint:    Right knee primary OA /pain  HPI: Alexander Murray, 74 y.o. male, has a history of pain and functional disability in the right knee due to arthritis and has failed non-surgical conservative treatments for greater than 12 weeks to include NSAID's and/or analgesics, corticosteriod injections, viscosupplementation injections and activity modification.  Onset of symptoms was gradual, starting 3+ years ago with gradually worsening course since that time. The patient noted prior procedures on the knee to include  arthroscopy on the right knee(s).  Patient currently rates pain in the right knee(s) at 5 out of 10 with activity. Patient has worsening of pain with activity and weight bearing, pain that interferes with activities of daily living, pain with passive range of motion, crepitus and joint swelling.  Patient has evidence of periarticular osteophytes and joint space narrowing by imaging studies.  There is no active infection.  Risks, benefits and expectations were discussed with the patient.  Risks including but not limited to the risk of anesthesia, blood clots, nerve damage, blood vessel damage, failure of the prosthesis, infection and up to and including death.  Patient understand the risks, benefits and expectations and wishes to proceed with surgery.   PCP: Donnajean Lopes, MD  D/C Plans:      Home with HHPT  Post-op Meds:       No Rx given  Tranexamic Acid:      To be given - IV  Decadron:      Is to be given  FYI:     Plavix and ASA  Norco post-op  No DME needs  He does have a Pacemaker    Patient Active Problem List   Diagnosis Date Noted  . Central retinal artery occlusion 06/26/2013  . Chronic anticoagulation 06/26/2013  . Family hx of colon cancer 06/26/2013  . HYPERCHOLESTEROLEMIA 08/20/2007  . ANEMIA, IRON DEFICIENCY, CHRONIC 08/20/2007  . HYPERTENSION,  BENIGN, MILD 08/20/2007  . Sinus bradycardia 08/20/2007  . RENAL CALCULUS, RECURRENT 08/20/2007  . ARTHRITIS, RIGHT KNEE 08/20/2007  . HEMOCHROMATOSIS 08/20/2007  . ESOPHAGEAL STRICTURE 06/14/2007  . COLONIC POLYPS 06/19/2005   Past Medical History  Diagnosis Date  . Palpitations   . Hypercholesteremia   . Skin cancer   . Esophageal stricture   . Hypertension   . Asthma   . Hypercalcemia   . Hemochromatosis   . Presence of permanent cardiac pacemaker   . Stroke [facility]) ~ 2011    "left eye"  . Retinal artery occlusion, branch     "right eye"  . Second degree AV block, Mobitz type II   . GERD (gastroesophageal reflux disease)   . Osteoarthritis   . Arthritis     "right knee and right thumb" (12/19/2014)    Past Surgical History  Procedure Laterality Date  . Knee arthroscopy Right ~ 1982; ~ 1992  . Colonoscopy    . Upper gastrointestinal endoscopy    . Posterior tibial tendon repair Left 2012  . Ep implantable device N/A 12/19/2014    Procedure: Pacemaker Implant;  Surgeon: Deboraha Sprang, MD;  Location: [facility];  Service: Cardiovascular;  Laterality: N/A;  . Insert / replace / remove pacemaker  12/19/2014  . Tonsillectomy  1946  . Inguinal hernia repair Left 1980's  . Mohs surgery Right ~ 2014    "pre-melanoma scapula"  . Esophagogastroduodenoscopy (egd) with esophageal dilation  X 3  No prescriptions prior to admission   No Known Allergies   Social History  Substance Use Topics  . Smoking status: Former Smoker -- 3 years    Types: Cigarettes    Quit date: 05/16/1962  . Smokeless tobacco: Never Used     Comment: "quit smoking in the 1960's"  . Alcohol Use: Yes     Comment: 12/19/2014 "stopped drinking in ~ 2012"    Family History  Problem Relation Age of Onset  . Colon cancer Mother   . Heart disease Mother   . Colon cancer Father   . Heart disease Father   . Prostate cancer Brother   . Hemochromatosis Brother      Review of Systems   Constitutional: Negative.   HENT: Negative.   Eyes: Negative.   Respiratory: Negative.   Cardiovascular: Negative.   Gastrointestinal: Positive for heartburn.  Genitourinary: Negative.   Musculoskeletal: Positive for joint pain.  Skin: Negative.   Neurological: Negative.   Endo/Heme/Allergies: Negative.   Psychiatric/Behavioral: Negative.     Objective:  Physical Exam  Constitutional: He is oriented to person, place, and time. He appears well-developed and well-nourished.  HENT:  Head: Normocephalic.  Eyes: Pupils are equal, round, and reactive to light.  Neck: Neck supple. No JVD present. No tracheal deviation present. No thyromegaly present.  Cardiovascular: Normal rate, regular rhythm, normal heart sounds and intact distal pulses.   Respiratory: Effort normal and breath sounds normal. No stridor. No respiratory distress. He has no wheezes.  GI: Soft. There is no tenderness. There is no guarding.  Musculoskeletal:       Right knee: He exhibits decreased range of motion, swelling and bony tenderness. He exhibits no ecchymosis, no deformity, no laceration and no erythema. Tenderness found.  Lymphadenopathy:    He has no cervical adenopathy.  Neurological: He is alert and oriented to person, place, and time.  Skin: Skin is warm and dry.  Psychiatric: He has a normal mood and affect.    Vital signs in last 24 hours: Temp:  [98 F (36.7 C)] 98 F (36.7 C) (10/14 0912) Pulse Rate:  [65] 65 (10/14 0912) Resp:  [16] 16 (10/14 0912) BP: (157)/(86) 157/86 mmHg (10/14 0912) SpO2:  [100 %] 100 % (10/14 0912) Weight:  [78.926 kg (174 lb)] 78.926 kg (174 lb) (10/14 0912)  Labs:   Estimated body mass index is 24.39 kg/(m^2) as calculated from the following:   Height as of 12/19/14: '5\' 10"'$  (1.778 m).   Weight as of 12/19/14: 77.111 kg (170 lb).   Imaging Review Plain radiographs demonstrate severe degenerative joint disease of the right knee(s).  The bone quality appears to be  good for age and reported activity level.  Assessment/Plan:  End stage arthritis, right knee   The patient history, physical examination, clinical judgment of the provider and imaging studies are consistent with end stage degenerative joint disease of the right knee(s) and total knee arthroplasty is deemed medically necessary. The treatment options including medical management, injection therapy arthroscopy and arthroplasty were discussed at length. The risks and benefits of total knee arthroplasty were presented and reviewed. The risks due to aseptic loosening, infection, stiffness, patella tracking problems, thromboembolic complications and other imponderables were discussed. The patient acknowledged the explanation, agreed to proceed with the plan and consent was signed. Patient is being admitted for inpatient treatment for surgery, pain control, PT, OT, prophylactic antibiotics, VTE prophylaxis, progressive ambulation and ADL's and discharge planning. The patient is planning to be discharged home  with home health services     [place] Pugh. [place]   PA-C  05/17/2015, 8:07 PM

## 2015-05-19 NOTE — Anesthesia Preprocedure Evaluation (Signed)
Anesthesia Evaluation  Patient identified by MRN, date of birth, ID band Patient awake    Reviewed: Allergy & Precautions, H&P , NPO status , Patient's Chart, lab work & pertinent test results  Airway Mallampati: II  TM Distance: >3 FB Neck ROM: full    Dental no notable dental hx. (+) Dental Advisory Given   Pulmonary asthma , former smoker,    Pulmonary exam normal breath sounds clear to auscultation       Cardiovascular hypertension, Pt. on medications Normal cardiovascular exam+ dysrhythmias + pacemaker  Rhythm:regular Rate:Normal  Mobitz type II second degree block   Neuro/Psych Left eye symptoms 2011 - central retinal artery occlusion CVA, Residual Symptoms negative neurological ROS  negative psych ROS   GI/Hepatic negative GI ROS, Neg liver ROS, GERD  Medicated and Controlled,Esophageal stricture   Endo/Other  negative endocrine ROS  Renal/GU negative Renal ROS  negative genitourinary   Musculoskeletal   Abdominal   Peds  Hematology negative hematology ROS (+) hemochromatosis   Anesthesia Other Findings ON PLAVIX  Reproductive/Obstetrics negative OB ROS                             Anesthesia Physical Anesthesia Plan  ASA: III  Anesthesia Plan:    Post-op Pain Management:    Induction:   Airway Management Planned:   Additional Equipment:   Intra-op Plan:   Post-operative Plan:   Informed Consent: I have reviewed the patients History and Physical, chart, labs and discussed the procedure including the risks, benefits and alternatives for the proposed anesthesia with the patient or authorized representative who has indicated his/her understanding and acceptance.   Dental Advisory Given  Plan Discussed with: CRNA and Surgeon  Anesthesia Plan Comments: (ON PLAVIX)        Anesthesia Quick Evaluation

## 2015-05-20 ENCOUNTER — Encounter (HOSPITAL_COMMUNITY): Payer: Self-pay | Admitting: *Deleted

## 2015-05-20 ENCOUNTER — Encounter (HOSPITAL_COMMUNITY)
Admission: RE | Disposition: A | Discharge status: Home Health | Payer: Self-pay | Source: Ambulatory Visit | Attending: Orthopedic Surgery

## 2015-05-20 ENCOUNTER — Inpatient Hospital Stay (HOSPITAL_COMMUNITY): Payer: Medicare Other | Admitting: Anesthesiology

## 2015-05-20 ENCOUNTER — Inpatient Hospital Stay (HOSPITAL_COMMUNITY)
Admission: RE | Admit: 2015-05-20 | Discharge: 2015-05-21 | DRG: 470 | Disposition: A | Discharge status: Home Health | Payer: Medicare Other | Source: Ambulatory Visit | Attending: Orthopedic Surgery | Admitting: Orthopedic Surgery

## 2015-05-20 DIAGNOSIS — Z87891 Personal history of nicotine dependence: Secondary | ICD-10-CM

## 2015-05-20 DIAGNOSIS — I10 Essential (primary) hypertension: Secondary | ICD-10-CM | POA: Diagnosis not present

## 2015-05-20 DIAGNOSIS — Z8673 Personal history of transient ischemic attack (TIA), and cerebral infarction without residual deficits: Secondary | ICD-10-CM | POA: Diagnosis not present

## 2015-05-20 DIAGNOSIS — M1711 Unilateral primary osteoarthritis, right knee: Principal | ICD-10-CM | POA: Diagnosis present

## 2015-05-20 DIAGNOSIS — M25561 Pain in right knee: Secondary | ICD-10-CM | POA: Diagnosis not present

## 2015-05-20 DIAGNOSIS — Z96651 Presence of right artificial knee joint: Secondary | ICD-10-CM

## 2015-05-20 DIAGNOSIS — Z01812 Encounter for preprocedural laboratory examination: Secondary | ICD-10-CM

## 2015-05-20 DIAGNOSIS — M659 Synovitis and tenosynovitis, unspecified: Secondary | ICD-10-CM | POA: Diagnosis present

## 2015-05-20 DIAGNOSIS — Z96659 Presence of unspecified artificial knee joint: Secondary | ICD-10-CM

## 2015-05-20 DIAGNOSIS — M179 Osteoarthritis of knee, unspecified: Secondary | ICD-10-CM | POA: Diagnosis not present

## 2015-05-20 DIAGNOSIS — Z950 Presence of cardiac pacemaker: Secondary | ICD-10-CM

## 2015-05-20 HISTORY — PX: TOTAL KNEE ARTHROPLASTY: SHX125

## 2015-05-20 LAB — TYPE AND SCREEN
ABO/RH(D): O POS
Antibody Screen: NEGATIVE

## 2015-05-20 SURGERY — ARTHROPLASTY, KNEE, TOTAL
Anesthesia: Spinal | Site: Knee | Laterality: Right

## 2015-05-20 MED ORDER — SODIUM CHLORIDE 0.9 % IJ SOLN
INTRAMUSCULAR | Status: DC | PRN
  Administered 2015-05-20: 30 mL

## 2015-05-20 MED ORDER — DOXAZOSIN MESYLATE 4 MG PO TABS
4.0000 mg | ORAL_TABLET | Freq: Every day | ORAL | Status: DC
  Administered 2015-05-20 – 2015-05-21 (×2): 4 mg via ORAL
  Filled 2015-05-20 (×2): qty 1

## 2015-05-20 MED ORDER — ROCURONIUM BROMIDE 100 MG/10ML IV SOLN
INTRAVENOUS | Status: AC
  Filled 2015-05-20: qty 1

## 2015-05-20 MED ORDER — PROPOFOL 10 MG/ML IV BOLUS
INTRAVENOUS | Status: AC
  Filled 2015-05-20: qty 20

## 2015-05-20 MED ORDER — PHENOL 1.4 % MT LIQD
1.0000 | OROMUCOSAL | Status: DC | PRN
Start: 2015-05-20 — End: 2015-05-21

## 2015-05-20 MED ORDER — CEFAZOLIN SODIUM-DEXTROSE 2-3 GM-% IV SOLR
2.0000 g | INTRAVENOUS | Status: AC
  Administered 2015-05-20: 2 g via INTRAVENOUS

## 2015-05-20 MED ORDER — BUPIVACAINE IN DEXTROSE 0.75-8.25 % IT SOLN
INTRATHECAL | Status: DC | PRN
  Administered 2015-05-20: 2 mL via INTRATHECAL

## 2015-05-20 MED ORDER — LACTATED RINGERS IV SOLN
INTRAVENOUS | Status: DC | PRN
  Administered 2015-05-20 (×2): via INTRAVENOUS

## 2015-05-20 MED ORDER — SIMVASTATIN 40 MG PO TABS
40.0000 mg | ORAL_TABLET | Freq: Every day | ORAL | Status: DC
  Administered 2015-05-20: 40 mg via ORAL
  Filled 2015-05-20 (×3): qty 1

## 2015-05-20 MED ORDER — METHOCARBAMOL 500 MG PO TABS
500.0000 mg | ORAL_TABLET | Freq: Four times a day (QID) | ORAL | Status: DC | PRN
  Administered 2015-05-21 (×2): 500 mg via ORAL
  Filled 2015-05-20 (×2): qty 1

## 2015-05-20 MED ORDER — CEFAZOLIN SODIUM-DEXTROSE 2-3 GM-% IV SOLR
2.0000 g | Freq: Four times a day (QID) | INTRAVENOUS | Status: AC
  Administered 2015-05-20 (×2): 2 g via INTRAVENOUS
  Filled 2015-05-20 (×2): qty 50

## 2015-05-20 MED ORDER — DEXAMETHASONE SODIUM PHOSPHATE 10 MG/ML IJ SOLN
10.0000 mg | Freq: Once | INTRAMUSCULAR | Status: AC
  Administered 2015-05-21: 10 mg via INTRAVENOUS
  Filled 2015-05-20: qty 1

## 2015-05-20 MED ORDER — ASPIRIN 81 MG PO TABS: 81.0000 mg | ORAL_TABLET | Freq: Every day | ORAL | Status: DC

## 2015-05-20 MED ORDER — HYDROMORPHONE HCL 1 MG/ML IJ SOLN
0.5000 mg | INTRAMUSCULAR | Status: DC | PRN
  Administered 2015-05-20: 0.5 mg via INTRAVENOUS
  Filled 2015-05-20: qty 1

## 2015-05-20 MED ORDER — CELECOXIB 200 MG PO CAPS
200.0000 mg | ORAL_CAPSULE | Freq: Two times a day (BID) | ORAL | Status: DC
  Administered 2015-05-20 – 2015-05-21 (×2): 200 mg via ORAL
  Filled 2015-05-20 (×3): qty 1

## 2015-05-20 MED ORDER — NON FORMULARY: 20.0000 mg | Freq: Every day | Status: DC

## 2015-05-20 MED ORDER — METOCLOPRAMIDE HCL 10 MG PO TABS: 5.0000 mg | ORAL_TABLET | Freq: Three times a day (TID) | ORAL | Status: DC | PRN

## 2015-05-20 MED ORDER — HYDROCODONE-ACETAMINOPHEN 7.5-325 MG PO TABS
1.0000 | ORAL_TABLET | ORAL | Status: DC
  Administered 2015-05-20: 1 via ORAL
  Administered 2015-05-20 (×2): 2 via ORAL
  Filled 2015-05-20: qty 1
  Filled 2015-05-20: qty 2
  Filled 2015-05-20: qty 1
  Filled 2015-05-20: qty 2

## 2015-05-20 MED ORDER — KETOROLAC TROMETHAMINE 30 MG/ML IJ SOLN
INTRAMUSCULAR | Status: AC
  Filled 2015-05-20: qty 1

## 2015-05-20 MED ORDER — ONDANSETRON HCL 4 MG/2ML IJ SOLN: 4.0000 mg | Freq: Four times a day (QID) | INTRAMUSCULAR | Status: DC | PRN

## 2015-05-20 MED ORDER — LACTATED RINGERS IV SOLN: INTRAVENOUS | Status: DC

## 2015-05-20 MED ORDER — SODIUM CHLORIDE 0.9 % IR SOLN
Status: DC | PRN
  Administered 2015-05-20: 1000 mL

## 2015-05-20 MED ORDER — FERROUS SULFATE 325 (65 FE) MG PO TABS
325.0000 mg | ORAL_TABLET | Freq: Three times a day (TID) | ORAL | Status: DC
  Filled 2015-05-20 (×6): qty 1

## 2015-05-20 MED ORDER — PROPOFOL 500 MG/50ML IV EMUL
INTRAVENOUS | Status: DC | PRN
  Administered 2015-05-20: 85 ug/kg/min via INTRAVENOUS

## 2015-05-20 MED ORDER — MAGNESIUM CITRATE PO SOLN: 1.0000 | Freq: Once | ORAL | Status: DC | PRN

## 2015-05-20 MED ORDER — 0.9 % SODIUM CHLORIDE (POUR BTL) OPTIME
TOPICAL | Status: DC | PRN
  Administered 2015-05-20: 1000 mL

## 2015-05-20 MED ORDER — DOCUSATE SODIUM 100 MG PO CAPS
100.0000 mg | ORAL_CAPSULE | Freq: Two times a day (BID) | ORAL | Status: DC
  Administered 2015-05-20 – 2015-05-21 (×2): 100 mg via ORAL

## 2015-05-20 MED ORDER — AMLODIPINE BESYLATE 2.5 MG PO TABS
2.5000 mg | ORAL_TABLET | Freq: Every day | ORAL | Status: DC
  Administered 2015-05-21: 2.5 mg via ORAL
  Filled 2015-05-20: qty 1

## 2015-05-20 MED ORDER — MONTELUKAST SODIUM 10 MG PO TABS
10.0000 mg | ORAL_TABLET | Freq: Every day | ORAL | Status: DC
  Administered 2015-05-21: 10 mg via ORAL
  Filled 2015-05-20: qty 1

## 2015-05-20 MED ORDER — DEXAMETHASONE SODIUM PHOSPHATE 10 MG/ML IJ SOLN
10.0000 mg | Freq: Once | INTRAMUSCULAR | Status: AC
  Administered 2015-05-20: 10 mg via INTRAVENOUS

## 2015-05-20 MED ORDER — ASPIRIN 81 MG PO CHEW
81.0000 mg | CHEWABLE_TABLET | Freq: Every day | ORAL | Status: DC
  Administered 2015-05-21: 81 mg via ORAL
  Filled 2015-05-20: qty 1

## 2015-05-20 MED ORDER — ESOMEPRAZOLE MAGNESIUM 20 MG PO CPDR
20.0000 mg | DELAYED_RELEASE_CAPSULE | Freq: Every day | ORAL | Status: DC
  Administered 2015-05-21: 20 mg via ORAL
  Filled 2015-05-20 (×3): qty 1

## 2015-05-20 MED ORDER — TRANEXAMIC ACID 1000 MG/10ML IV SOLN
1000.0000 mg | Freq: Once | INTRAVENOUS | Status: AC
  Administered 2015-05-20: 1000 mg via INTRAVENOUS
  Filled 2015-05-20: qty 10

## 2015-05-20 MED ORDER — PHENYLEPHRINE HCL 10 MG/ML IJ SOLN
INTRAMUSCULAR | Status: DC | PRN
  Administered 2015-05-20: 80 ug via INTRAVENOUS

## 2015-05-20 MED ORDER — ONDANSETRON HCL 4 MG PO TABS: 4.0000 mg | ORAL_TABLET | Freq: Four times a day (QID) | ORAL | Status: DC | PRN

## 2015-05-20 MED ORDER — HYDROMORPHONE HCL 1 MG/ML IJ SOLN: 0.2500 mg | INTRAMUSCULAR | Status: DC | PRN

## 2015-05-20 MED ORDER — LIDOCAINE HCL (CARDIAC) 20 MG/ML IV SOLN
INTRAVENOUS | Status: AC
  Filled 2015-05-20: qty 5

## 2015-05-20 MED ORDER — BISACODYL 10 MG RE SUPP: 10.0000 mg | Freq: Every day | RECTAL | Status: DC | PRN

## 2015-05-20 MED ORDER — FENTANYL CITRATE (PF) 100 MCG/2ML IJ SOLN
INTRAMUSCULAR | Status: AC
  Filled 2015-05-20: qty 4

## 2015-05-20 MED ORDER — BUPIVACAINE-EPINEPHRINE (PF) 0.25% -1:200000 IJ SOLN
INTRAMUSCULAR | Status: DC | PRN
  Administered 2015-05-20: 30 mL

## 2015-05-20 MED ORDER — KETOROLAC TROMETHAMINE 30 MG/ML IJ SOLN
INTRAMUSCULAR | Status: DC | PRN
  Administered 2015-05-20: 30 mg

## 2015-05-20 MED ORDER — ONDANSETRON HCL 4 MG/2ML IJ SOLN
INTRAMUSCULAR | Status: AC
  Filled 2015-05-20: qty 2

## 2015-05-20 MED ORDER — ONDANSETRON HCL 4 MG/2ML IJ SOLN
INTRAMUSCULAR | Status: DC | PRN
  Administered 2015-05-20: 4 mg via INTRAVENOUS

## 2015-05-20 MED ORDER — METOCLOPRAMIDE HCL 5 MG/ML IJ SOLN: 5.0000 mg | Freq: Three times a day (TID) | INTRAMUSCULAR | Status: DC | PRN

## 2015-05-20 MED ORDER — CLOPIDOGREL BISULFATE 75 MG PO TABS
75.0000 mg | ORAL_TABLET | ORAL | Status: DC
  Administered 2015-05-21: 75 mg via ORAL
  Filled 2015-05-20 (×2): qty 1

## 2015-05-20 MED ORDER — CHLORTHALIDONE 25 MG PO TABS
25.0000 mg | ORAL_TABLET | ORAL | Status: DC
  Administered 2015-05-21: 25 mg via ORAL
  Filled 2015-05-20: qty 1

## 2015-05-20 MED ORDER — MENTHOL 3 MG MT LOZG: 1.0000 | LOZENGE | OROMUCOSAL | Status: DC | PRN

## 2015-05-20 MED ORDER — BUPIVACAINE-EPINEPHRINE (PF) 0.25% -1:200000 IJ SOLN
INTRAMUSCULAR | Status: AC
  Filled 2015-05-20: qty 30

## 2015-05-20 MED ORDER — CEFAZOLIN SODIUM-DEXTROSE 2-3 GM-% IV SOLR
INTRAVENOUS | Status: AC
  Filled 2015-05-20: qty 50

## 2015-05-20 MED ORDER — POLYETHYLENE GLYCOL 3350 17 G PO PACK
17.0000 g | PACK | Freq: Two times a day (BID) | ORAL | Status: DC
  Administered 2015-05-20 – 2015-05-21 (×2): 17 g via ORAL

## 2015-05-20 MED ORDER — MIDAZOLAM HCL 5 MG/5ML IJ SOLN
INTRAMUSCULAR | Status: DC | PRN
  Administered 2015-05-20: 2 mg via INTRAVENOUS

## 2015-05-20 MED ORDER — DIPHENHYDRAMINE HCL 25 MG PO CAPS: 25.0000 mg | ORAL_CAPSULE | Freq: Four times a day (QID) | ORAL | Status: DC | PRN

## 2015-05-20 MED ORDER — ALUM & MAG HYDROXIDE-SIMETH 200-200-20 MG/5ML PO SUSP: 30.0000 mL | ORAL | Status: DC | PRN

## 2015-05-20 MED ORDER — MIDAZOLAM HCL 2 MG/2ML IJ SOLN
INTRAMUSCULAR | Status: AC
  Filled 2015-05-20: qty 4

## 2015-05-20 MED ORDER — DEXAMETHASONE SODIUM PHOSPHATE 10 MG/ML IJ SOLN
INTRAMUSCULAR | Status: AC
  Filled 2015-05-20: qty 1

## 2015-05-20 MED ORDER — CHLORHEXIDINE GLUCONATE 4 % EX LIQD: 60.0000 mL | Freq: Once | CUTANEOUS | Status: DC

## 2015-05-20 MED ORDER — FENTANYL CITRATE (PF) 100 MCG/2ML IJ SOLN
INTRAMUSCULAR | Status: DC | PRN
  Administered 2015-05-20 (×2): 50 ug via INTRAVENOUS

## 2015-05-20 MED ORDER — DEXTROSE 5 % IV SOLN
500.0000 mg | Freq: Four times a day (QID) | INTRAVENOUS | Status: DC | PRN
  Administered 2015-05-20: 500 mg via INTRAVENOUS
  Filled 2015-05-20 (×3): qty 5

## 2015-05-20 MED ORDER — SODIUM CHLORIDE 0.9 % IV SOLN
INTRAVENOUS | Status: DC
  Administered 2015-05-20: 12:00:00 via INTRAVENOUS
  Filled 2015-05-20 (×4): qty 1000

## 2015-05-20 MED ORDER — SODIUM CHLORIDE 0.9 % IJ SOLN
INTRAMUSCULAR | Status: AC
  Filled 2015-05-20: qty 50

## 2015-05-20 MED ORDER — PROPOFOL 10 MG/ML IV BOLUS
INTRAVENOUS | Status: DC | PRN
  Administered 2015-05-20: 40 mg via INTRAVENOUS

## 2015-05-20 SURGICAL SUPPLY — 61 items
BAG DECANTER FOR FLEXI CONT (MISCELLANEOUS) IMPLANT
BAG SPEC THK2 15X12 ZIP CLS (MISCELLANEOUS) ×1
BAG ZIPLOCK 12X15 (MISCELLANEOUS) ×1 IMPLANT
BANDAGE ELASTIC 6 VELCRO ST LF (GAUZE/BANDAGES/DRESSINGS) ×2 IMPLANT
BANDAGE ESMARK 6X9 LF (GAUZE/BANDAGES/DRESSINGS) ×1 IMPLANT
BLADE SAW SGTL 13.0X1.19X90.0M (BLADE) ×2 IMPLANT
BNDG CMPR 9X6 STRL LF SNTH (GAUZE/BANDAGES/DRESSINGS) ×1
BNDG ESMARK 6X9 LF (GAUZE/BANDAGES/DRESSINGS) ×2
BOWL SMART MIX CTS (DISPOSABLE) ×2 IMPLANT
CAPT KNEE TOTAL 3 ATTUNE ×1 IMPLANT
CEMENT HV SMART SET (Cement) ×2 IMPLANT
CUFF TOURN SGL QUICK 34 (TOURNIQUET CUFF) ×2
CUFF TRNQT CYL 34X4X40X1 (TOURNIQUET CUFF) ×1 IMPLANT
DECANTER SPIKE VIAL GLASS SM (MISCELLANEOUS) ×2 IMPLANT
DRAPE EXTREMITY T 121X128X90 (DRAPE) ×2 IMPLANT
DRAPE POUCH INSTRU U-SHP 10X18 (DRAPES) ×2 IMPLANT
DRAPE U-SHAPE 47X51 STRL (DRAPES) ×2 IMPLANT
DRSG AQUACEL AG ADV 3.5X10 (GAUZE/BANDAGES/DRESSINGS) ×2 IMPLANT
DURAPREP 26ML APPLICATOR (WOUND CARE) ×4 IMPLANT
ELECT REM PT RETURN 9FT ADLT (ELECTROSURGICAL) ×2
ELECTRODE REM PT RTRN 9FT ADLT (ELECTROSURGICAL) ×1 IMPLANT
FACESHIELD WRAPAROUND (MASK) ×10 IMPLANT
FACESHIELD WRAPAROUND OR TEAM (MASK) ×5 IMPLANT
GLOVE BIO SURGEON STRL SZ7.5 (GLOVE) ×1 IMPLANT
GLOVE BIOGEL PI IND STRL 7.0 (GLOVE) IMPLANT
GLOVE BIOGEL PI IND STRL 7.5 (GLOVE) ×1 IMPLANT
GLOVE BIOGEL PI IND STRL 8.5 (GLOVE) ×1 IMPLANT
GLOVE BIOGEL PI INDICATOR 7.0 (GLOVE) ×1
GLOVE BIOGEL PI INDICATOR 7.5 (GLOVE) ×3
GLOVE BIOGEL PI INDICATOR 8.5 (GLOVE) ×1
GLOVE ECLIPSE 8.0 STRL XLNG CF (GLOVE) ×3 IMPLANT
GLOVE ORTHO TXT STRL SZ7.5 (GLOVE) ×4 IMPLANT
GLOVE SURG SS PI 7.0 STRL IVOR (GLOVE) ×1 IMPLANT
GOWN SPEC L3 XXLG W/TWL (GOWN DISPOSABLE) ×2 IMPLANT
GOWN STRL REUS W/TWL LRG LVL3 (GOWN DISPOSABLE) ×2 IMPLANT
GOWN STRL REUS W/TWL XL LVL3 (GOWN DISPOSABLE) ×1 IMPLANT
HANDPIECE INTERPULSE COAX TIP (DISPOSABLE) ×2
KIT BASIN OR (CUSTOM PROCEDURE TRAY) ×2 IMPLANT
LIQUID BAND (GAUZE/BANDAGES/DRESSINGS) ×2 IMPLANT
MANIFOLD NEPTUNE II (INSTRUMENTS) ×2 IMPLANT
NDL SAFETY ECLIPSE 18X1.5 (NEEDLE) ×1 IMPLANT
NEEDLE HYPO 18GX1.5 SHARP (NEEDLE) ×2
PACK TOTAL JOINT (CUSTOM PROCEDURE TRAY) ×2 IMPLANT
PEN SKIN MARKING BROAD (MISCELLANEOUS) ×2 IMPLANT
POSITIONER SURGICAL ARM (MISCELLANEOUS) ×2 IMPLANT
SET HNDPC FAN SPRY TIP SCT (DISPOSABLE) ×1 IMPLANT
SET PAD KNEE POSITIONER (MISCELLANEOUS) ×2 IMPLANT
SUCTION FRAZIER 12FR DISP (SUCTIONS) ×2 IMPLANT
SUT MNCRL AB 4-0 PS2 18 (SUTURE) ×2 IMPLANT
SUT VIC AB 1 CT1 36 (SUTURE) ×2 IMPLANT
SUT VIC AB 2-0 CT1 27 (SUTURE) ×6
SUT VIC AB 2-0 CT1 TAPERPNT 27 (SUTURE) ×3 IMPLANT
SUT VLOC 180 0 24IN GS25 (SUTURE) ×2 IMPLANT
SYR 50ML LL SCALE MARK (SYRINGE) ×2 IMPLANT
TOWEL OR 17X26 10 PK STRL BLUE (TOWEL DISPOSABLE) ×2 IMPLANT
TOWEL OR NON WOVEN STRL DISP B (DISPOSABLE) ×1 IMPLANT
TRAY FOLEY W/METER SILVER 14FR (SET/KITS/TRAYS/PACK) ×1 IMPLANT
TRAY FOLEY W/METER SILVER 16FR (SET/KITS/TRAYS/PACK) ×2 IMPLANT
WATER STERILE IRR 1500ML POUR (IV SOLUTION) ×2 IMPLANT
WRAP KNEE MAXI GEL POST OP (GAUZE/BANDAGES/DRESSINGS) ×2 IMPLANT
YANKAUER SUCT BULB TIP 10FT TU (MISCELLANEOUS) ×2 IMPLANT

## 2015-05-20 NOTE — Plan of Care (Signed)
Problem: Consults Goal: Diagnosis- Total Joint Replacement Primary Total Knee     

## 2015-05-20 NOTE — Anesthesia Procedure Notes (Signed)
Spinal Patient location during procedure: OR Start time: 05/20/2015 7:47 AM End time: 05/20/2015 7:57 AM Staffing Resident/CRNA: Lavine Hargrove Performed by: resident/CRNA  Preanesthetic Checklist Completed: patient identified, site marked, surgical consent, pre-op evaluation, timeout performed, IV checked, risks and benefits discussed and monitors and equipment checked Spinal Block Patient position: sitting Prep: Betadine Patient monitoring: heart rate, cardiac monitor, continuous pulse ox and blood pressure Approach: midline Location: L3-4 Injection technique: single-shot Needle Needle type: Spinocan and Sprotte  Needle gauge: 24 G Needle length: 10 cm Needle insertion depth: 8 cm Assessment Sensory level: T6 Additional Notes -heme, -para, VSS.  Pt tolerated well.  Exp 09-30-16 Lot  [id number]

## 2015-05-20 NOTE — Transfer of Care (Signed)
Immediate Anesthesia Transfer of Care Note  Patient: Alexander Murray  Procedure(s) Performed: Procedure(s): RIGHT TOTAL KNEE ARTHROPLASTY (Right)  Patient Location: PACU  Anesthesia Type:Spinal  Level of Consciousness:  sedated, patient cooperative and responds to stimulation  Airway & Oxygen Therapy:Patient Spontanous Breathing and Patient connected to face mask oxgen  Post-op Assessment:  Report given to PACU RN and Post -op Vital signs reviewed and stable  Post vital signs:  Reviewed and stable  Last Vitals:  Filed Vitals:   05/20/15 0949  BP: 113/71  Pulse:   Temp:   Resp:     Complications: No apparent anesthesia complications

## 2015-05-20 NOTE — Evaluation (Signed)
Physical Therapy Evaluation Patient Details Name: Alexander Murray MRN: [id number] DOB: June 25, 1941 Today's Date: 05/20/2015   History of Present Illness  Pt is a 74 year old male s/p R TKA with hx of pacemaker  Clinical Impression  Pt is s/p R TKA resulting in the deficits listed below (see PT Problem List).  Pt will benefit from skilled PT to increase their independence and safety with mobility to allow discharge to the venue listed below.  Pt mobilizing well POD #0 and plans to d/c home with spouse.     Follow Up Recommendations Home health PT    Equipment Recommendations  None recommended by PT    Recommendations for Other Services       Precautions / Restrictions Precautions Precautions: Knee Restrictions Other Position/Activity Restrictions: WBAT      Mobility  Bed Mobility Overal bed mobility: Needs Assistance Bed Mobility: Supine to Sit     Supine to sit: Min guard     General bed mobility comments: verbal cues for self assist  Transfers Overall transfer level: Needs assistance Equipment used: Rolling walker (2 wheeled) Transfers: Sit to/from Stand Sit to Stand: Min guard;From elevated surface         General transfer comment: verbal cues for safe technique  Ambulation/Gait Ambulation/Gait assistance: Min guard Ambulation Distance (Feet): 80 Feet Assistive device: Rolling walker (2 wheeled) Gait Pattern/deviations: Step-to pattern;Antalgic     General Gait Details: verbal cues for sequence, step length, RW distance, encouraged allowing knee flexion as tolerated as pt occasionally compensating with slight circumduction  Stairs            Wheelchair Mobility    Modified Rankin (Stroke Patients Only)       Balance                                             Pertinent Vitals/Pain Pain Assessment: 0-10 Pain Score: 5  Pain Location: increased R knee pain with flexion per pt Pain Descriptors / Indicators:  Aching;Sore Pain Intervention(s): Repositioned;Premedicated before session;Monitored during session;Limited activity within patient's tolerance;Ice applied    Home Living Family/patient expects to be discharged to:: Private residence Living Arrangements: Spouse/significant other   Type of Home: House Home Access: Stairs to enter Entrance Stairs-Rails: Left;Right;Can reach both Technical brewer of Steps: 3 Home Layout: Able to live on main level with bedroom/bathroom Home Equipment: Walker - 2 wheels;Crutches      Prior Function Level of Independence: Independent               Hand Dominance        Extremity/Trunk Assessment               Lower Extremity Assessment: RLE deficits/detail RLE Deficits / Details: able to perform SLR, pain with flexion so not specifically tested at time of eval       Communication   Communication: No difficulties  Cognition Arousal/Alertness: Awake/alert Behavior During Therapy: WFL for tasks assessed/performed Overall Cognitive Status: Within Functional Limits for tasks assessed                      General Comments      Exercises        Assessment/Plan    PT Assessment Patient needs continued PT services  PT Diagnosis Difficulty walking;Acute pain   PT Problem List Decreased strength;Decreased range of motion;Decreased mobility;Decreased  knowledge of use of DME;Decreased knowledge of precautions;Pain  PT Treatment Interventions Functional mobility training;Stair training;Gait training;DME instruction;Patient/family education;Therapeutic activities;Therapeutic exercise   PT Goals (Current goals can be found in the Care Plan section) Acute Rehab PT Goals PT Goal Formulation: With patient Time For Goal Achievement: 05/24/15 Potential to Achieve Goals: Good    Frequency 7X/week   Barriers to discharge        Co-evaluation               End of Session Equipment Utilized During Treatment: Gait  belt Activity Tolerance: Patient tolerated treatment well Patient left: in chair;with call bell/phone within reach;with family/visitor present           Time: 1292-9090 PT Time Calculation (min) (ACUTE ONLY): 17 min   Charges:   PT Evaluation $Initial PT Evaluation Tier I: 1 Procedure     PT G Codes:        Roston Grunewald,KATHrine E 05/20/2015, 5:24 PM Carmelia Bake, PT, DPT 05/20/2015 Pager: [phone]

## 2015-05-20 NOTE — Progress Notes (Signed)
Utilization review completed.  

## 2015-05-20 NOTE — Op Note (Signed)
NAME:  Alexander Murray                      MEDICAL RECORD NO.:  [id number]                             FACILITY:  [facility]      PHYSICIAN:  Pietro Cassis. Alvan Dame, M.D.  DATE OF BIRTH:  06/08/1941      DATE OF PROCEDURE:  05/20/2015                                     OPERATIVE REPORT         PREOPERATIVE DIAGNOSIS:  Right knee osteoarthritis.      POSTOPERATIVE DIAGNOSIS:  Right knee osteoarthritis.      FINDINGS:  The patient was noted to have complete loss of cartilage and   bone-on-bone arthritis with associated osteophytes in all three compartments of   the knee with a significant synovitis and associated effusion.      PROCEDURE:  Right total knee replacement.      COMPONENTS USED:  DePuy Attune rotating platform posterior stabilized knee   system, a size 7 femur, 7 tibia, size 6 mm PS AOX insert, and 41 anatomic patellar   button.      SURGEON:  Pietro Cassis. Alvan Dame, M.D.      ASSISTANT:  Danae Orleans, PA-C.      ANESTHESIA:  Spinal.      SPECIMENS:  None.      COMPLICATION:  None.      DRAINS:  One Hemovac.  EBL: <50cc      TOURNIQUET TIME:   Total Tourniquet Time Documented: Thigh (Right) - 40 minutes Total: Thigh (Right) - 40 minutes     The patient was stable to the recovery room.      INDICATION FOR PROCEDURE:  Alexander Murray is a 74 y.o. male patient of   mine.  The patient had been seen, evaluated, and treated conservatively in the   office with medication, activity modification, and injections.  The patient had   radiographic changes of bone-on-bone arthritis with endplate sclerosis and osteophytes noted.      The patient failed conservative measures including medication, injections, and activity modification, and at this point was ready for more definitive measures.   Based on the radiographic changes and failed conservative measures, the patient   decided to proceed with total knee replacement.  Risks of infection,   DVT, component failure, need for revision  surgery, postop course, and   expectations were all   discussed and reviewed.  Consent was obtained for benefit of pain   relief.      PROCEDURE IN DETAIL:  The patient was brought to the operative theater.   Once adequate anesthesia, preoperative antibiotics, 2 gm of Ancef, 1 gm of Tranexamic Acid, and 10 mg of Decadron administered, the patient was positioned supine with the right thigh tourniquet placed.  The  right lower extremity was prepped and draped in sterile fashion.  A time-   out was performed identifying the patient, planned procedure, and   extremity.      The right lower extremity was placed in the [facility] leg holder.  The leg was   exsanguinated, tourniquet elevated to 250 mmHg.  A midline incision was   made followed by median parapatellar arthrotomy.  Following initial   exposure, attention was first directed to the patella.  Precut   measurement was noted to be 28 mm.  I resected down to 15-16 mm and used a   41 patellar button to restore patellar height as well as cover the cut   surface.      The lug holes were drilled and a metal shim was placed to protect the   patella from retractors and saw blades.      At this point, attention was now directed to the femur.  The femoral   canal was opened with a drill, irrigated to try to prevent fat emboli.  An   intramedullary rod was passed at 3 degrees valgus, 9 mm of bone was   resected off the distal femur.  Following this resection, the tibia was   subluxated anteriorly.  Using the extramedullary guide, 4 mm of bone was resected off   the proximal lateral tibia.  We confirmed the gap would be   stable medially and laterally with a size 5 mm insert as well as confirmed   the cut was perpendicular in the coronal plane, checking with an alignment rod.      Once this was done, I sized the femur to be a size 7 in the anterior-   posterior dimension, chose a standard component based on medial and   lateral dimension.  The  size 7 rotation block was then pinned in   position anterior referenced using the C-clamp to set rotation.  The   anterior, posterior, and  chamfer cuts were made without difficulty nor   notching making certain that I was along the anterior cortex to help   with flexion gap stability.      The final box cut was made off the lateral aspect of distal femur.  I laminar spreader was used to remove posterior osteophytes and chondral surfaces as well as remove debris from back of knee.      At this point, the tibia was sized to be a size 7, the size 7 tray was   then pinned in position through the medial third of the tubercle,   drilled, and keel punched.  Trial reduction was now carried with a 7 femur,  7 tibia, a size 6 mm insert, and the 41 patella botton.  The knee was brought to   extension, full extension with good flexion stability with the patella   tracking through the trochlea without application of pressure.  Given   all these findings, the trial components removed.  Final components were   opened and cement was mixed.  The knee was irrigated with normal saline   solution and pulse lavage.  The synovial lining was   then injected with 30 cc 0.25% Marcaine with epinephrine and 1 cc of Toradol plus 30 cc of NS for a    total of 61 cc.      The knee was irrigated.  Final implants were then cemented onto clean and   dried cut surfaces of bone with the knee brought to extension with a size 6 mm trial insert.      Once the cement had fully cured, the excess cement was removed   throughout the knee.  I confirmed I was satisfied with the range of   motion and stability, and the final size 6 mm PS AOX insert was chosen.  It was   placed into the knee.      The tourniquet  had been let down at 40 minutes.  No significant   hemostasis required.  The   extensor mechanism was then reapproximated using #1 Vicryl and # 0 V-lock sutures with the knee   in flexion.  The   remaining wound was  closed with 2-0 Vicryl and running 4-0 Monocryl.   The knee was cleaned, dried, dressed sterilely using Dermabond and   Aquacel dressing.  The patient was then   brought to recovery room in stable condition, tolerating the procedure   well.   Please note that Physician Assistant, Danae Orleans, PA-C, was present for the entirety of the case, and was utilized for pre-operative positioning, peri-operative retractor management, general facilitation of the procedure.  He was also utilized for primary wound closure at the end of the case.              Pietro Cassis Alvan Dame, M.D.    05/20/2015 9:23 AM

## 2015-05-20 NOTE — Anesthesia Postprocedure Evaluation (Signed)
  Anesthesia Post-op Note  Patient: Alexander Murray  Procedure(s) Performed: Procedure(s) (LRB): RIGHT TOTAL KNEE ARTHROPLASTY (Right)  Patient Location: PACU  Anesthesia Type: Spinal  Level of Consciousness: awake and alert   Airway and Oxygen Therapy: Patient Spontanous Breathing  Post-op Pain: mild  Post-op Assessment: Post-op Vital signs reviewed, Patient's Cardiovascular Status Stable, Respiratory Function Stable, Patent Airway and No signs of Nausea or vomiting  Last Vitals:  Filed Vitals:   05/20/15 1045  BP: 147/82  Pulse: 61  Temp: 36.4 C  Resp: 16    Post-op Vital Signs: stable   Complications: No apparent anesthesia complications

## 2015-05-20 NOTE — Interval H&P Note (Signed)
History and Physical Interval Note:  05/20/2015 7:42 AM  Alexander Murray  has presented today for surgery, with the diagnosis of right knee osteoarthritis  The various methods of treatment have been discussed with the patient and family. After consideration of risks, benefits and other options for treatment, the patient has consented to  Procedure(s): RIGHT TOTAL KNEE ARTHROPLASTY (Right) as a surgical intervention .  The patient's history has been reviewed, patient examined, no change in status, stable for surgery.  I have reviewed the patient's chart and labs.  Questions were answered to the patient's satisfaction.     Mauri Pole

## 2015-05-21 LAB — CBC
HCT: 34.1 % — ABNORMAL LOW (ref 39.0–52.0)
Hemoglobin: 11.3 g/dL — ABNORMAL LOW (ref 13.0–17.0)
MCH: 29 pg (ref 26.0–34.0)
MCHC: 33.1 g/dL (ref 30.0–36.0)
MCV: 87.4 fL (ref 78.0–100.0)
Platelets: 139 10*3/uL — ABNORMAL LOW (ref 150–400)
RBC: 3.9 MIL/uL — ABNORMAL LOW (ref 4.22–5.81)
RDW: 14.6 % (ref 11.5–15.5)
WBC: 10.4 10*3/uL (ref 4.0–10.5)

## 2015-05-21 LAB — BASIC METABOLIC PANEL
Anion gap: 7 (ref 5–15)
BUN: 15 mg/dL (ref 6–20)
CO2: 26 mmol/L (ref 22–32)
Calcium: 9.4 mg/dL (ref 8.9–10.3)
Chloride: 103 mmol/L (ref 101–111)
Creatinine, Ser: 0.91 mg/dL (ref 0.61–1.24)
GFR calc Af Amer: >60 mL/min (ref >60)
GFR calc non Af Amer: >60 mL/min (ref >60)
Glucose, Bld: 151 mg/dL — ABNORMAL HIGH (ref 65–99)
Potassium: 3.9 mmol/L (ref 3.5–5.1)
Sodium: 136 mmol/L (ref 135–145)

## 2015-05-21 MED ORDER — ACETAMINOPHEN 325 MG PO TABS
650.0000 mg | ORAL_TABLET | Freq: Four times a day (QID) | ORAL | Status: DC | PRN
  Administered 2015-05-21: 650 mg via ORAL
  Filled 2015-05-21: qty 2

## 2015-05-21 MED ORDER — FERROUS SULFATE 325 (65 FE) MG PO TABS: 325.0000 mg | ORAL_TABLET | Freq: Three times a day (TID) | ORAL | Status: DC

## 2015-05-21 MED ORDER — ACETAMINOPHEN 325 MG PO TABS: 650.0000 mg | ORAL_TABLET | Freq: Four times a day (QID) | ORAL | Status: DC | PRN

## 2015-05-21 MED ORDER — POLYETHYLENE GLYCOL 3350 17 G PO PACK: 17.0000 g | PACK | Freq: Two times a day (BID) | ORAL | Status: DC

## 2015-05-21 MED ORDER — HYDROCODONE-ACETAMINOPHEN 7.5-325 MG PO TABS: 1.0000 | ORAL_TABLET | ORAL | Status: DC | PRN

## 2015-05-21 MED ORDER — DOCUSATE SODIUM 100 MG PO CAPS: 100.0000 mg | ORAL_CAPSULE | Freq: Two times a day (BID) | ORAL | Status: DC

## 2015-05-21 MED ORDER — CELECOXIB 200 MG PO CAPS: 200.0000 mg | ORAL_CAPSULE | Freq: Two times a day (BID) | ORAL | Status: DC

## 2015-05-21 MED ORDER — METHOCARBAMOL 500 MG PO TABS: 500.0000 mg | ORAL_TABLET | Freq: Four times a day (QID) | ORAL | Status: DC | PRN

## 2015-05-21 NOTE — Evaluation (Signed)
Occupational Therapy Evaluation Patient Details Name: Alexander Murray MRN: [id number] DOB: 07-15-1941 Today's Date: 05/21/2015    History of Present Illness Pt is a 74 year old male s/p R TKA with hx of pacemaker   Clinical Impression   Pt was admitted for the above.  All education was completed. No further OT is needed at this time    Follow Up Recommendations  No OT follow up;Supervision/Assistance - 24 hour    Equipment Recommendations  None recommended by OT    Recommendations for Other Services       Precautions / Restrictions Precautions Precautions: Knee Restrictions Weight Bearing Restrictions: No      Mobility Bed Mobility   Bed Mobility: Supine to Sit     Supine to sit: Supervision     General bed mobility comments: pt able to lift and slide RLE over  Transfers   Equipment used: Rolling walker (2 wheeled) Transfers: Sit to/from Stand Sit to Stand: Min guard         General transfer comment: cues for UE placement    Balance                                            ADL Overall ADL's : Needs assistance/impaired     Grooming: Oral care;Wash/dry face;Brushing hair;Supervision/safety;Standing   Upper Body Bathing: Set up;Sitting   Lower Body Bathing: Minimal assistance;Sit to/from stand   Upper Body Dressing : Set up;Sitting   Lower Body Dressing: Moderate assistance;Sit to/from stand   Toilet Transfer: Min guard;Ambulation;BSC   Toileting- Water quality scientist and Hygiene: Min guard;Sit to/from stand   Tub/ Shower Transfer: Walk-in shower;Min guard;Ambulation     General ADL Comments: performed limited bathing and LB dressing in bathroom (pants/underwear).  His wife has had 2 uniknees.  Reviewed safety and precautions.  Pt verbalizes understanding     Vision     Perception     Praxis      Pertinent Vitals/Pain Pain Assessment: Faces Faces Pain Scale: Hurts little more Pain Location: R knee Pain  Intervention(s): Limited activity within patient's tolerance;Monitored during session;Repositioned;Ice applied (had muscle relaxant; did not want pain meds)     Hand Dominance     Extremity/Trunk Assessment             Communication Communication Communication: No difficulties   Cognition Arousal/Alertness: Awake/alert Behavior During Therapy: WFL for tasks assessed/performed Overall Cognitive Status: Within Functional Limits for tasks assessed                     General Comments       Exercises       Shoulder Instructions      Home Living Family/patient expects to be discharged to:: Private residence Living Arrangements: Spouse/significant other                 Bathroom Shower/Tub: Occupational psychologist: Standard     Home Equipment: Bedside commode;Shower seat          Prior Functioning/Environment Level of Independence: Independent             OT Diagnosis: Acute pain   OT Problem List:     OT Treatment/Interventions:      OT Goals(Current goals can be found in the care plan section) Acute Rehab OT Goals Patient Stated Goal: get back to being independent  OT  Frequency:     Barriers to D/C:            Co-evaluation              End of Session    Activity Tolerance: Patient tolerated treatment well Patient left: in chair;with call bell/phone within reach   Time: 0852-0918 OT Time Calculation (min): 26 min Charges:  OT General Charges $OT Visit: 1 Procedure OT Evaluation $Initial OT Evaluation Tier I: 1 Procedure OT Treatments $Self Care/Home Management : 8-22 mins G-Codes:    Alishba Naples 06-16-2015, 9:26 AM Lesle Chris, OTR/L [phone] 2015-06-16

## 2015-05-21 NOTE — Care Management Note (Signed)
Case Management Note  Patient Details  Name: Alexander Murray MRN: [id number] Date of Birth: 1940/09/08  Subjective/Objective:                   RIGHT TOTAL KNEE ARTHROPLASTY (Right) Action/Plan: Discharge planning  Expected Discharge Date:  05/21/15               Expected Discharge Plan:  [place]  In-House Referral:     Discharge planning Services  CM Consult  Post Acute Care Choice:  Home Health Choice offered to:  Patient  DME Arranged:  N/A DME Agency:  NA  HH Arranged:  PT HH Agency:  [place]  Status of Service:  Completed, signed off  Medicare Important Message Given:    Date Medicare IM Given:    Medicare IM give by:    Date Additional Medicare IM Given:    Additional Medicare Important Message give by:     If discussed at [place] of Stay Meetings, dates discussed:    Additional Comments: CM met with pt and spouse in room to offer choice of home agency.  Pt chooses [facility] to render HHPT.  Address and contact information verified by pt.  Pt has rolling walker, 3n1, crutches, and shower stool at home.  Referral given to [employer], Tim.  NO other CM needs were communicated. Dellie Catholic, RN 05/21/2015, 10:52 AM

## 2015-05-21 NOTE — Discharge Summary (Signed)
Physician Discharge Summary  Patient ID: Alexander Murray MRN: [id number] DOB/AGE: October 30, 1940 74 y.o.  Admit date: 05/20/2015 Discharge date:  05/21/2015  Procedures:  Procedure(s) (LRB): RIGHT TOTAL KNEE ARTHROPLASTY (Right)  Attending Physician:  Dr. Paralee Cancel   Admission Diagnoses:   Right knee primary OA /pain  Discharge Diagnoses:  Principal Problem:   S/P right TKA Active Problems:   S/P knee replacement  Past Medical History  Diagnosis Date  . Palpitations   . Hypercholesteremia   . Skin cancer   . Esophageal stricture   . Hypertension   . Asthma   . Hypercalcemia   . Hemochromatosis   . Presence of permanent cardiac pacemaker   . Stroke [facility]) ~ 2011    "left eye"  . Retinal artery occlusion, branch     "right eye"  . Second degree AV block, Mobitz type II   . GERD (gastroesophageal reflux disease)   . Osteoarthritis   . Arthritis     "right knee and right thumb" (12/19/2014)    HPI:    Alexander Murray, 74 y.o. male, has a history of pain and functional disability in the right knee due to arthritis and has failed non-surgical conservative treatments for greater than 12 weeks to include NSAID's and/or analgesics, corticosteriod injections, viscosupplementation injections and activity modification. Onset of symptoms was gradual, starting 3+ years ago with gradually worsening course since that time. The patient noted prior procedures on the knee to include arthroscopy on the right knee(s). Patient currently rates pain in the right knee(s) at 5 out of 10 with activity. Patient has worsening of pain with activity and weight bearing, pain that interferes with activities of daily living, pain with passive range of motion, crepitus and joint swelling. Patient has evidence of periarticular osteophytes and joint space narrowing by imaging studies. There is no active infection. Risks, benefits and expectations were discussed with the patient. Risks including but not  limited to the risk of anesthesia, blood clots, nerve damage, blood vessel damage, failure of the prosthesis, infection and up to and including death. Patient understand the risks, benefits and expectations and wishes to proceed with surgery.   PCP: Donnajean Lopes, MD   Discharged Condition: good  Hospital Course:  Patient underwent the above stated procedure on 05/20/2015. Patient tolerated the procedure well and brought to the recovery room in good condition and subsequently to the floor.  POD #1 BP: 117/70 ; Pulse: 60 ; Temp: 98.6 F (37 C) ; Resp: 16 Patient reports pain as mild. Tolerating this early post-operative course well. No events.  Ready to be discharged home. Dorsiflexion/plantar flexion intact, incision: dressing C/D/I, no cellulitis present and compartment soft.   LABS  Basename    HGB  11.3  HCT  34.1    Discharge Exam: General appearance: alert, cooperative and no distress Extremities: Homans sign is negative, no sign of DVT, no edema, redness or tenderness in the calves or thighs and no ulcers, gangrene or trophic changes  Disposition: Home with follow up in 2 weeks   Follow-up Information    Follow up with Mauri Pole, MD. Schedule an appointment as soon as possible for a visit in 2 weeks.   Specialty:  Orthopedic Surgery   Contact information:   [address] [place] [address] [phone]       Follow up with [facility].   Why:  home health physical therapy   Contact information:   [place]  [phone]       Discharge Instructions    Call MD / Call 911    Complete by:  As directed   If you experience chest pain or shortness of breath, CALL 911 and be transported to the hospital emergency room.  If you develope a fever above 101 F, pus (white drainage) or increased drainage or redness at the wound, or calf pain, call your surgeon's office.     Change dressing     Complete by:  As directed   Maintain surgical dressing until follow up in the clinic. If the edges start to pull up, may reinforce with tape. If the dressing is no longer working, may remove and cover with gauze and tape, but must keep the area dry and clean.  Call with any questions or concerns.     Constipation Prevention    Complete by:  As directed   Drink plenty of fluids.  Prune juice may be helpful.  You may use a stool softener, such as Colace (over the counter) 100 mg twice a day.  Use MiraLax (over the counter) for constipation as needed.     Diet - low sodium heart healthy    Complete by:  As directed      Discharge instructions    Complete by:  As directed   Maintain surgical dressing until follow up in the clinic. If the edges start to pull up, may reinforce with tape. If the dressing is no longer working, may remove and cover with gauze and tape, but must keep the area dry and clean.  Follow up in 2 weeks at [facility]. Call with any questions or concerns.     Increase activity slowly as tolerated    Complete by:  As directed   Weight bearing as tolerated with assist device (walker, cane, etc) as directed, use it as long as suggested by your surgeon or therapist, typically at least 4-6 weeks.     TED hose    Complete by:  As directed   Use stockings (TED hose) for 2 weeks on both leg(s).  You may remove them at night for sleeping.             Medication List    TAKE these medications        acetaminophen 325 MG tablet  Commonly known as:  TYLENOL  Take 2 tablets (650 mg total) by mouth every 6 (six) hours as needed for mild pain or moderate pain.     amLODipine 2.5 MG tablet  Commonly known as:  NORVASC  Take 2.5 mg by mouth daily.     aspirin 81 MG tablet  Take 81 mg by mouth daily.     celecoxib 200 MG capsule  Commonly known as:  CELEBREX  Take 1 capsule (200 mg total) by mouth every 12 (twelve) hours.     chlorthalidone 25 MG tablet  Commonly  known as:  HYGROTON  Take 25 mg by mouth every other day.     clopidogrel 75 MG tablet  Commonly known as:  PLAVIX  Take 75 mg by mouth every other day.     docusate sodium 100 MG capsule  Commonly known as:  COLACE  Take 1 capsule (100 mg total) by mouth 2 (two) times daily.     doxazosin 8 MG tablet  Commonly known as:  CARDURA  Take 4 mg by mouth daily.     esomeprazole 20 MG capsule  Commonly known as:  NEXIUM  Take 20 mg by mouth daily.     ferrous sulfate 325 (65 FE) MG tablet  Take 1 tablet (325 mg total) by mouth 3 (three) times daily after meals.     GLUCOSAMINE CHONDROITIN ADV PO  Take 1,500 mg by mouth daily.     HYDROcodone-acetaminophen 7.5-325 MG tablet  Commonly known as:  NORCO  Take 1-2 tablets by mouth every 4 (four) hours as needed for moderate pain.     methocarbamol 500 MG tablet  Commonly known as:  ROBAXIN  Take 1 tablet (500 mg total) by mouth every 6 (six) hours as needed for muscle spasms.     mometasone 50 MCG/ACT nasal spray  Commonly known as:  NASONEX  Place 2 sprays into the nose daily as needed.     montelukast 10 MG tablet  Commonly known as:  SINGULAIR  Take 10 mg by mouth every morning.     multivitamin capsule  Take 1 capsule by mouth daily.     polyethylene glycol packet  Commonly known as:  MIRALAX / GLYCOLAX  Take 17 g by mouth 2 (two) times daily.     simvastatin 40 MG tablet  Commonly known as:  ZOCOR  Take 40 mg by mouth daily.     vitamin C 500 MG tablet  Commonly known as:  ASCORBIC ACID  Take 500 mg by mouth daily.         Signed: West Pugh. Refael Fulop   PA-C  05/21/2015, 1:23 PM

## 2015-05-21 NOTE — Discharge Instructions (Signed)

## 2015-05-21 NOTE — Progress Notes (Signed)
Physical Therapy Treatment Patient Details Name: Alexander Murray MRN: [id number] DOB: [birth year] Today's Date: 05/21/2015    History of Present Illness Pt is a 74 year old male s/p R TKA with hx of pacemaker    PT Comments    POD # 1 am session.  Assisted with amb a greater distance then returned to room to perform TKR TE's following HEP handout.  Instructed on proper tech and freq followed by ICE. Pt plans to D/C to home after 2nd PT session.  Follow Up Recommendations  Home health PT     Equipment Recommendations  None recommended by PT    Recommendations for Other Services       Precautions / Restrictions Precautions Precautions: Knee Restrictions Weight Bearing Restrictions: No Other Position/Activity Restrictions: WBAT    Mobility  Bed Mobility Overal bed mobility: Needs Assistance Bed Mobility: Sit to Supine       Sit to supine: Min guard;Supervision   General bed mobility comments: back to bed to perform TKR TE's  Transfers Overall transfer level: Needs assistance Equipment used: Rolling walker (2 wheeled) Transfers: Sit to/from Stand Sit to Stand: Supervision;Min guard         General transfer comment: cues for UE placement  Ambulation/Gait Ambulation/Gait assistance: Supervision;Min guard Ambulation Distance (Feet): 125 Feet Assistive device: Rolling walker (2 wheeled) Gait Pattern/deviations: Step-to pattern;Step-through pattern;Decreased stride length     General Gait Details: 25% VC's on proper walker to self distance and safety with turns   Financial trader Rankin (Stroke Patients Only)       Balance                                    Cognition Arousal/Alertness: Awake/alert Behavior During Therapy: WFL for tasks assessed/performed Overall Cognitive Status: Within Functional Limits for tasks assessed                      Exercises   Total Knee Replacement TE's 10  reps B LE ankle pumps 10 reps towel squeezes 10 reps knee presses 10 reps heel slides  10 reps SAQ's 10 reps SLR's 10 reps ABD Followed by ICE     General Comments        Pertinent Vitals/Pain Pain Assessment: 0-10 Pain Score: 5  Pain Location: R knee Pain Descriptors / Indicators: Aching;Sore Pain Intervention(s): Repositioned;Ice applied    Home Living                      Prior Function            PT Goals (current goals can now be found in the care plan section) Progress towards PT goals: Progressing toward goals    Frequency  7X/week    PT Plan Current plan remains appropriate    Co-evaluation             End of Session Equipment Utilized During Treatment: Gait belt Activity Tolerance: Patient tolerated treatment well Patient left: in bed;with call bell/phone within reach     Time: 0940-1020 PT Time Calculation (min) (ACUTE ONLY): 40 min  Charges:  $Gait Training: 8-22 mins $Therapeutic Exercise: 8-22 mins $Therapeutic Activity: 8-22 mins                    G Codes:  Rica Koyanagi  PTA [facility]  Acute  Rehab Pager      [phone]

## 2015-05-21 NOTE — Progress Notes (Signed)
Physical Therapy Treatment Patient Details Name: Alexander Murray MRN: [id number] DOB: 1940/08/06 Today's Date: 05/21/2015    History of Present Illness Pt is a 74 year old male s/p R TKA with hx of pacemaker    PT Comments    POD # 1 pm session.  Spouse present for education.  Had spouse amb pt in hallway with instruction on safe handling.  Practiced stairs.  Then returned to room to perform sitting TE's following handout.  Instructed on use of ICE.  Instructed on proper positioning L LE in full extension.  All other mobility questions addressed.  Pt ready for D/C to home.   Follow Up Recommendations  Home health PT     Equipment Recommendations  None recommended by PT    Recommendations for Other Services       Precautions / Restrictions Precautions Precautions: Knee Restrictions Weight Bearing Restrictions: No Other Position/Activity Restrictions: WBAT    Mobility  Bed Mobility Overal bed mobility: Needs Assistance Bed Mobility: Sit to Supine       Sit to supine: Min guard;Supervision   General bed mobility comments: back to bed to perform TKR TE's  Transfers Overall transfer level: Needs assistance Equipment used: Rolling walker (2 wheeled) Transfers: Sit to/from Stand Sit to Stand: Supervision;Min guard         General transfer comment: cues for UE placement  Ambulation/Gait Ambulation/Gait assistance: Supervision;Min guard Ambulation Distance (Feet): 155 Feet Assistive device: Rolling walker (2 wheeled) Gait Pattern/deviations: Step-to pattern;Step-through pattern;Decreased stride length     General Gait Details: 25% VC's on proper walker to self distance and safety with turns   Stairs Stairs: Yes Stairs assistance: Min guard Stair Management: One rail Right;Step to pattern;Forwards;With cane Number of Stairs: 4 General stair comments: with spouse performed stair training  Wheelchair Mobility    Modified Rankin (Stroke Patients Only)       Balance                                    Cognition Arousal/Alertness: Awake/alert Behavior During Therapy: WFL for tasks assessed/performed Overall Cognitive Status: Within Functional Limits for tasks assessed                      Exercises  10 reps R LE knee flexion while sitting 10 reps R LE LAQ's    General Comments        Pertinent Vitals/Pain Pain Assessment: 0-10 Pain Score: 5  Pain Location: R knee Pain Descriptors / Indicators: Aching;Sore Pain Intervention(s): Repositioned;Ice applied    Home Living                      Prior Function            PT Goals (current goals can now be found in the care plan section) Progress towards PT goals: Progressing toward goals    Frequency  7X/week    PT Plan Current plan remains appropriate    Co-evaluation             End of Session Equipment Utilized During Treatment: Gait belt Activity Tolerance: Patient tolerated treatment well Patient left: in bed;with call bell/phone within reach     Time: 1300-1340 PT Time Calculation (min) (ACUTE ONLY): 40 min  Charges:  $Gait Training: 8-22 mins $Therapeutic Exercise: 8-22 mins $Therapeutic Activity: 8-22 mins  G Codes:      Rica Koyanagi  PTA [facility]  Acute  Rehab Pager      [phone]

## 2015-05-21 NOTE — Progress Notes (Signed)
Patient ID: Alexander Murray, male   DOB: 01-09-1941, 74 y.o.   MRN: [id number] Subjective: 1 Day Post-Op Procedure(s) (LRB): RIGHT TOTAL KNEE ARTHROPLASTY (Right)    Patient reports pain as mild.  Tolerating this early post-operative course well.  No events.   Objective:   VITALS:   Filed Vitals:   05/21/15 0509  BP: 117/70  Pulse: 60  Temp: 98.6 F (37 C)  Resp: 16    Neurovascular intact Incision: dressing C/D/I  LABS  Recent Labs  05/21/15 0444  HGB 11.3*  HCT 34.1*  WBC 10.4  PLT 139*     Recent Labs  05/21/15 0444  NA 136  K 3.9  BUN 15  CREATININE 0.91  GLUCOSE 151*    No results for input(s): LABPT, INR in the last 72 hours.   Assessment/Plan: 1 Day Post-Op Procedure(s) (LRB): RIGHT TOTAL KNEE ARTHROPLASTY (Right)   Advance diet Up with therapy Discharge home with home health, already set up with out patient PT in 1-2 weeks  RTC in 2 weeks

## 2015-05-22 DIAGNOSIS — I10 Essential (primary) hypertension: Secondary | ICD-10-CM | POA: Diagnosis not present

## 2015-05-22 DIAGNOSIS — J45909 Unspecified asthma, uncomplicated: Secondary | ICD-10-CM | POA: Diagnosis not present

## 2015-05-22 DIAGNOSIS — D509 Iron deficiency anemia, unspecified: Secondary | ICD-10-CM | POA: Diagnosis not present

## 2015-05-22 DIAGNOSIS — Z96651 Presence of right artificial knee joint: Secondary | ICD-10-CM | POA: Diagnosis not present

## 2015-05-22 DIAGNOSIS — M1990 Unspecified osteoarthritis, unspecified site: Secondary | ICD-10-CM | POA: Diagnosis not present

## 2015-05-22 DIAGNOSIS — Z471 Aftercare following joint replacement surgery: Secondary | ICD-10-CM | POA: Diagnosis not present

## 2015-05-23 DIAGNOSIS — M1990 Unspecified osteoarthritis, unspecified site: Secondary | ICD-10-CM | POA: Diagnosis not present

## 2015-05-23 DIAGNOSIS — J45909 Unspecified asthma, uncomplicated: Secondary | ICD-10-CM | POA: Diagnosis not present

## 2015-05-23 DIAGNOSIS — Z96651 Presence of right artificial knee joint: Secondary | ICD-10-CM | POA: Diagnosis not present

## 2015-05-23 DIAGNOSIS — Z471 Aftercare following joint replacement surgery: Secondary | ICD-10-CM | POA: Diagnosis not present

## 2015-05-23 DIAGNOSIS — D509 Iron deficiency anemia, unspecified: Secondary | ICD-10-CM | POA: Diagnosis not present

## 2015-05-23 DIAGNOSIS — I10 Essential (primary) hypertension: Secondary | ICD-10-CM | POA: Diagnosis not present

## 2015-05-24 DIAGNOSIS — I10 Essential (primary) hypertension: Secondary | ICD-10-CM | POA: Diagnosis not present

## 2015-05-24 DIAGNOSIS — Z96651 Presence of right artificial knee joint: Secondary | ICD-10-CM | POA: Diagnosis not present

## 2015-05-24 DIAGNOSIS — D509 Iron deficiency anemia, unspecified: Secondary | ICD-10-CM | POA: Diagnosis not present

## 2015-05-24 DIAGNOSIS — Z471 Aftercare following joint replacement surgery: Secondary | ICD-10-CM | POA: Diagnosis not present

## 2015-05-24 DIAGNOSIS — J45909 Unspecified asthma, uncomplicated: Secondary | ICD-10-CM | POA: Diagnosis not present

## 2015-05-24 DIAGNOSIS — M1990 Unspecified osteoarthritis, unspecified site: Secondary | ICD-10-CM | POA: Diagnosis not present

## 2015-05-27 DIAGNOSIS — M1990 Unspecified osteoarthritis, unspecified site: Secondary | ICD-10-CM | POA: Diagnosis not present

## 2015-05-27 DIAGNOSIS — J45909 Unspecified asthma, uncomplicated: Secondary | ICD-10-CM | POA: Diagnosis not present

## 2015-05-27 DIAGNOSIS — I10 Essential (primary) hypertension: Secondary | ICD-10-CM | POA: Diagnosis not present

## 2015-05-27 DIAGNOSIS — Z96651 Presence of right artificial knee joint: Secondary | ICD-10-CM | POA: Diagnosis not present

## 2015-05-27 DIAGNOSIS — Z471 Aftercare following joint replacement surgery: Secondary | ICD-10-CM | POA: Diagnosis not present

## 2015-05-27 DIAGNOSIS — D509 Iron deficiency anemia, unspecified: Secondary | ICD-10-CM | POA: Diagnosis not present

## 2015-05-28 DIAGNOSIS — M1990 Unspecified osteoarthritis, unspecified site: Secondary | ICD-10-CM | POA: Diagnosis not present

## 2015-05-28 DIAGNOSIS — Z471 Aftercare following joint replacement surgery: Secondary | ICD-10-CM | POA: Diagnosis not present

## 2015-05-28 DIAGNOSIS — J45909 Unspecified asthma, uncomplicated: Secondary | ICD-10-CM | POA: Diagnosis not present

## 2015-05-28 DIAGNOSIS — I10 Essential (primary) hypertension: Secondary | ICD-10-CM | POA: Diagnosis not present

## 2015-05-28 DIAGNOSIS — D509 Iron deficiency anemia, unspecified: Secondary | ICD-10-CM | POA: Diagnosis not present

## 2015-05-28 DIAGNOSIS — Z96651 Presence of right artificial knee joint: Secondary | ICD-10-CM | POA: Diagnosis not present

## 2015-05-29 DIAGNOSIS — M1990 Unspecified osteoarthritis, unspecified site: Secondary | ICD-10-CM | POA: Diagnosis not present

## 2015-05-29 DIAGNOSIS — J45909 Unspecified asthma, uncomplicated: Secondary | ICD-10-CM | POA: Diagnosis not present

## 2015-05-29 DIAGNOSIS — D509 Iron deficiency anemia, unspecified: Secondary | ICD-10-CM | POA: Diagnosis not present

## 2015-05-29 DIAGNOSIS — Z471 Aftercare following joint replacement surgery: Secondary | ICD-10-CM | POA: Diagnosis not present

## 2015-05-29 DIAGNOSIS — I10 Essential (primary) hypertension: Secondary | ICD-10-CM | POA: Diagnosis not present

## 2015-05-29 DIAGNOSIS — Z96651 Presence of right artificial knee joint: Secondary | ICD-10-CM | POA: Diagnosis not present

## 2015-05-30 DIAGNOSIS — M1711 Unilateral primary osteoarthritis, right knee: Secondary | ICD-10-CM | POA: Diagnosis not present

## 2015-05-31 DIAGNOSIS — M1711 Unilateral primary osteoarthritis, right knee: Secondary | ICD-10-CM | POA: Diagnosis not present

## 2015-06-03 DIAGNOSIS — M6740 Ganglion, unspecified site: Secondary | ICD-10-CM | POA: Diagnosis not present

## 2015-06-03 DIAGNOSIS — L821 Other seborrheic keratosis: Secondary | ICD-10-CM | POA: Diagnosis not present

## 2015-06-03 DIAGNOSIS — Z85820 Personal history of malignant melanoma of skin: Secondary | ICD-10-CM | POA: Diagnosis not present

## 2015-06-03 DIAGNOSIS — L905 Scar conditions and fibrosis of skin: Secondary | ICD-10-CM | POA: Diagnosis not present

## 2015-06-03 DIAGNOSIS — D692 Other nonthrombocytopenic purpura: Secondary | ICD-10-CM | POA: Diagnosis not present

## 2015-06-03 DIAGNOSIS — Z85828 Personal history of other malignant neoplasm of skin: Secondary | ICD-10-CM | POA: Diagnosis not present

## 2015-06-03 DIAGNOSIS — L820 Inflamed seborrheic keratosis: Secondary | ICD-10-CM | POA: Diagnosis not present

## 2015-06-03 DIAGNOSIS — L570 Actinic keratosis: Secondary | ICD-10-CM | POA: Diagnosis not present

## 2015-06-03 DIAGNOSIS — M1711 Unilateral primary osteoarthritis, right knee: Secondary | ICD-10-CM | POA: Diagnosis not present

## 2015-06-03 DIAGNOSIS — D225 Melanocytic nevi of trunk: Secondary | ICD-10-CM | POA: Diagnosis not present

## 2015-06-05 DIAGNOSIS — Z471 Aftercare following joint replacement surgery: Secondary | ICD-10-CM | POA: Diagnosis not present

## 2015-06-05 DIAGNOSIS — Z96651 Presence of right artificial knee joint: Secondary | ICD-10-CM | POA: Diagnosis not present

## 2015-06-05 DIAGNOSIS — M1711 Unilateral primary osteoarthritis, right knee: Secondary | ICD-10-CM | POA: Diagnosis not present

## 2015-06-10 DIAGNOSIS — M1711 Unilateral primary osteoarthritis, right knee: Secondary | ICD-10-CM | POA: Diagnosis not present

## 2015-06-12 DIAGNOSIS — M1711 Unilateral primary osteoarthritis, right knee: Secondary | ICD-10-CM | POA: Diagnosis not present

## 2015-06-14 DIAGNOSIS — M1711 Unilateral primary osteoarthritis, right knee: Secondary | ICD-10-CM | POA: Diagnosis not present

## 2015-06-17 DIAGNOSIS — M1711 Unilateral primary osteoarthritis, right knee: Secondary | ICD-10-CM | POA: Diagnosis not present

## 2015-06-19 DIAGNOSIS — M1711 Unilateral primary osteoarthritis, right knee: Secondary | ICD-10-CM | POA: Diagnosis not present

## 2015-06-21 DIAGNOSIS — M1711 Unilateral primary osteoarthritis, right knee: Secondary | ICD-10-CM | POA: Diagnosis not present

## 2015-06-25 DIAGNOSIS — M1711 Unilateral primary osteoarthritis, right knee: Secondary | ICD-10-CM | POA: Diagnosis not present

## 2015-07-01 DIAGNOSIS — Z96651 Presence of right artificial knee joint: Secondary | ICD-10-CM | POA: Diagnosis not present

## 2015-07-01 DIAGNOSIS — Z471 Aftercare following joint replacement surgery: Secondary | ICD-10-CM | POA: Diagnosis not present

## 2015-07-02 DIAGNOSIS — M1711 Unilateral primary osteoarthritis, right knee: Secondary | ICD-10-CM | POA: Diagnosis not present

## 2015-07-04 ENCOUNTER — Telehealth: Payer: Self-pay | Admitting: Cardiology

## 2015-07-04 ENCOUNTER — Ambulatory Visit (INDEPENDENT_AMBULATORY_CARE_PROVIDER_SITE_OTHER): Payer: Medicare Other | Admitting: *Deleted

## 2015-07-04 DIAGNOSIS — I441 Atrioventricular block, second degree: Secondary | ICD-10-CM

## 2015-07-04 NOTE — Telephone Encounter (Signed)
Spoke with pt and reminded pt of remote transmission that is due today. Pt verbalized understanding.   

## 2015-07-05 DIAGNOSIS — M1711 Unilateral primary osteoarthritis, right knee: Secondary | ICD-10-CM | POA: Diagnosis not present

## 2015-07-05 NOTE — Progress Notes (Signed)
Remote pacemaker transmission.   

## 2015-07-09 DIAGNOSIS — M1711 Unilateral primary osteoarthritis, right knee: Secondary | ICD-10-CM | POA: Diagnosis not present

## 2015-07-12 DIAGNOSIS — M1711 Unilateral primary osteoarthritis, right knee: Secondary | ICD-10-CM | POA: Diagnosis not present

## 2015-07-15 LAB — CUP PACEART REMOTE DEVICE CHECK
Battery Remaining Longevity: 114 mo
Battery Voltage: 3.03 V
Brady Statistic AP VP Percent: 68.93 %
Brady Statistic AP VS Percent: 0.04 %
Brady Statistic AS VP Percent: 30.84 %
Brady Statistic AS VS Percent: 0.19 %
Brady Statistic RA Percent Paced: 68.97 %
Brady Statistic RV Percent Paced: 99.77 %
Date Time Interrogation Session: 20161201200233
Implantable Lead Implant Date: 20160518
Implantable Lead Implant Date: 20160518
Implantable Lead Location: 753859
Implantable Lead Location: 753860
Implantable Lead Model: 5076
Implantable Lead Model: 5076
Lead Channel Impedance Value: 342 Ohm
Lead Channel Impedance Value: 513 Ohm
Lead Channel Impedance Value: 703 Ohm
Lead Channel Impedance Value: 798 Ohm
Lead Channel Pacing Threshold Amplitude: 0.5 V
Lead Channel Pacing Threshold Amplitude: 0.75 V
Lead Channel Pacing Threshold Pulse Width: 0.4 ms
Lead Channel Pacing Threshold Pulse Width: 0.4 ms
Lead Channel Sensing Intrinsic Amplitude: 19.125 mV
Lead Channel Sensing Intrinsic Amplitude: 19.125 mV
Lead Channel Sensing Intrinsic Amplitude: 3.75 mV
Lead Channel Sensing Intrinsic Amplitude: 3.75 mV
Lead Channel Setting Pacing Amplitude: 1.5 V
Lead Channel Setting Pacing Amplitude: 2.5 V
Lead Channel Setting Pacing Pulse Width: 0.4 ms
Lead Channel Setting Sensing Sensitivity: 4 mV

## 2015-07-16 ENCOUNTER — Encounter: Payer: Self-pay | Admitting: Cardiology

## 2015-07-16 DIAGNOSIS — M1711 Unilateral primary osteoarthritis, right knee: Secondary | ICD-10-CM | POA: Diagnosis not present

## 2015-07-19 DIAGNOSIS — M1711 Unilateral primary osteoarthritis, right knee: Secondary | ICD-10-CM | POA: Diagnosis not present

## 2015-07-23 DIAGNOSIS — M1711 Unilateral primary osteoarthritis, right knee: Secondary | ICD-10-CM | POA: Diagnosis not present

## 2015-07-26 DIAGNOSIS — M1711 Unilateral primary osteoarthritis, right knee: Secondary | ICD-10-CM | POA: Diagnosis not present

## 2015-07-31 DIAGNOSIS — M1711 Unilateral primary osteoarthritis, right knee: Secondary | ICD-10-CM | POA: Diagnosis not present

## 2015-07-31 DIAGNOSIS — Z96651 Presence of right artificial knee joint: Secondary | ICD-10-CM | POA: Diagnosis not present

## 2015-07-31 DIAGNOSIS — Z471 Aftercare following joint replacement surgery: Secondary | ICD-10-CM | POA: Diagnosis not present

## 2015-08-04 HISTORY — PX: EYE SURGERY: SHX253

## 2015-10-03 ENCOUNTER — Ambulatory Visit (INDEPENDENT_AMBULATORY_CARE_PROVIDER_SITE_OTHER): Payer: Medicare Other | Admitting: *Deleted

## 2015-10-03 DIAGNOSIS — I441 Atrioventricular block, second degree: Secondary | ICD-10-CM | POA: Diagnosis not present

## 2015-10-03 NOTE — Progress Notes (Signed)
Remote pacemaker transmission.   

## 2015-10-09 DIAGNOSIS — Z125 Encounter for screening for malignant neoplasm of prostate: Secondary | ICD-10-CM | POA: Diagnosis not present

## 2015-10-09 DIAGNOSIS — I10 Essential (primary) hypertension: Secondary | ICD-10-CM | POA: Diagnosis not present

## 2015-10-09 DIAGNOSIS — E782 Mixed hyperlipidemia: Secondary | ICD-10-CM | POA: Diagnosis not present

## 2015-10-11 LAB — CUP PACEART REMOTE DEVICE CHECK
Battery Remaining Longevity: 103 mo
Battery Voltage: 3.02 V
Brady Statistic AP VP Percent: 70.1 %
Brady Statistic AP VS Percent: 0.04 %
Brady Statistic AS VP Percent: 29.75 %
Brady Statistic AS VS Percent: 0.11 %
Brady Statistic RA Percent Paced: 70.14 %
Brady Statistic RV Percent Paced: 99.85 %
Date Time Interrogation Session: 20170302152340
Implantable Lead Implant Date: 20160518
Implantable Lead Implant Date: 20160518
Implantable Lead Location: 753859
Implantable Lead Location: 753860
Implantable Lead Model: 5076
Implantable Lead Model: 5076
Lead Channel Impedance Value: 342 Ohm
Lead Channel Impedance Value: 494 Ohm
Lead Channel Impedance Value: 684 Ohm
Lead Channel Impedance Value: 760 Ohm
Lead Channel Pacing Threshold Amplitude: 0.5 V
Lead Channel Pacing Threshold Amplitude: 0.625 V
Lead Channel Pacing Threshold Pulse Width: 0.4 ms
Lead Channel Pacing Threshold Pulse Width: 0.4 ms
Lead Channel Sensing Intrinsic Amplitude: 3.625 mV
Lead Channel Sensing Intrinsic Amplitude: 3.625 mV
Lead Channel Sensing Intrinsic Amplitude: 8 mV
Lead Channel Sensing Intrinsic Amplitude: 8 mV
Lead Channel Setting Pacing Amplitude: 1.5 V
Lead Channel Setting Pacing Amplitude: 2.5 V
Lead Channel Setting Pacing Pulse Width: 0.4 ms
Lead Channel Setting Sensing Sensitivity: 4 mV

## 2015-10-11 NOTE — Progress Notes (Signed)
Normal remote reviewed.  Next follow up 12/2015 in clinic

## 2015-10-15 DIAGNOSIS — Z6825 Body mass index (BMI) 25.0-25.9, adult: Secondary | ICD-10-CM | POA: Diagnosis not present

## 2015-10-15 DIAGNOSIS — E782 Mixed hyperlipidemia: Secondary | ICD-10-CM | POA: Diagnosis not present

## 2015-10-15 DIAGNOSIS — Z862 Personal history of diseases of the blood and blood-forming organs and certain disorders involving the immune mechanism: Secondary | ICD-10-CM | POA: Diagnosis not present

## 2015-10-15 DIAGNOSIS — N320 Bladder-neck obstruction: Secondary | ICD-10-CM | POA: Diagnosis not present

## 2015-10-15 DIAGNOSIS — Z1389 Encounter for screening for other disorder: Secondary | ICD-10-CM | POA: Diagnosis not present

## 2015-10-15 DIAGNOSIS — I10 Essential (primary) hypertension: Secondary | ICD-10-CM | POA: Diagnosis not present

## 2015-10-15 DIAGNOSIS — Z1212 Encounter for screening for malignant neoplasm of rectum: Secondary | ICD-10-CM | POA: Diagnosis not present

## 2015-10-15 DIAGNOSIS — Z0000 Encounter for general adult medical examination without abnormal findings: Secondary | ICD-10-CM | POA: Diagnosis not present

## 2015-10-15 DIAGNOSIS — M1990 Unspecified osteoarthritis, unspecified site: Secondary | ICD-10-CM | POA: Diagnosis not present

## 2015-10-16 ENCOUNTER — Encounter: Payer: Self-pay | Admitting: Cardiology

## 2015-10-30 DIAGNOSIS — I10 Essential (primary) hypertension: Secondary | ICD-10-CM | POA: Diagnosis not present

## 2015-11-04 DIAGNOSIS — I10 Essential (primary) hypertension: Secondary | ICD-10-CM | POA: Diagnosis not present

## 2015-11-11 DIAGNOSIS — H0015 Chalazion left lower eyelid: Secondary | ICD-10-CM | POA: Diagnosis not present

## 2015-12-02 DIAGNOSIS — L821 Other seborrheic keratosis: Secondary | ICD-10-CM | POA: Diagnosis not present

## 2015-12-02 DIAGNOSIS — L570 Actinic keratosis: Secondary | ICD-10-CM | POA: Diagnosis not present

## 2015-12-02 DIAGNOSIS — D225 Melanocytic nevi of trunk: Secondary | ICD-10-CM | POA: Diagnosis not present

## 2015-12-02 DIAGNOSIS — L905 Scar conditions and fibrosis of skin: Secondary | ICD-10-CM | POA: Diagnosis not present

## 2015-12-02 DIAGNOSIS — D485 Neoplasm of uncertain behavior of skin: Secondary | ICD-10-CM | POA: Diagnosis not present

## 2015-12-02 DIAGNOSIS — Z85828 Personal history of other malignant neoplasm of skin: Secondary | ICD-10-CM | POA: Diagnosis not present

## 2015-12-23 DIAGNOSIS — H3411 Central retinal artery occlusion, right eye: Secondary | ICD-10-CM | POA: Diagnosis not present

## 2015-12-23 DIAGNOSIS — H2513 Age-related nuclear cataract, bilateral: Secondary | ICD-10-CM | POA: Diagnosis not present

## 2015-12-23 DIAGNOSIS — H524 Presbyopia: Secondary | ICD-10-CM | POA: Diagnosis not present

## 2015-12-23 DIAGNOSIS — H43813 Vitreous degeneration, bilateral: Secondary | ICD-10-CM | POA: Diagnosis not present

## 2016-01-02 ENCOUNTER — Encounter: Payer: Medicare Other | Admitting: Internal Medicine

## 2016-01-22 ENCOUNTER — Encounter: Payer: Medicare Other | Admitting: Internal Medicine

## 2016-02-06 ENCOUNTER — Encounter: Payer: Medicare Other | Admitting: Internal Medicine

## 2016-03-03 ENCOUNTER — Encounter: Payer: Self-pay | Admitting: Internal Medicine

## 2016-03-03 ENCOUNTER — Ambulatory Visit (INDEPENDENT_AMBULATORY_CARE_PROVIDER_SITE_OTHER): Payer: Medicare Other | Admitting: Internal Medicine

## 2016-03-03 VITALS — BP 130/76 | HR 69 | Ht 70.0 in | Wt 173.0 lb

## 2016-03-03 DIAGNOSIS — R001 Bradycardia, unspecified: Secondary | ICD-10-CM

## 2016-03-03 DIAGNOSIS — I441 Atrioventricular block, second degree: Secondary | ICD-10-CM

## 2016-03-03 DIAGNOSIS — Z950 Presence of cardiac pacemaker: Secondary | ICD-10-CM | POA: Diagnosis not present

## 2016-03-03 LAB — CUP PACEART INCLINIC DEVICE CHECK
Battery Remaining Longevity: 90 mo
Battery Voltage: 3.01 V
Brady Statistic AP VP Percent: 70.13 %
Brady Statistic AP VS Percent: 0.04 %
Brady Statistic AS VP Percent: 29.7 %
Brady Statistic AS VS Percent: 0.13 %
Brady Statistic RA Percent Paced: 70.17 %
Brady Statistic RV Percent Paced: 99.83 %
Date Time Interrogation Session: 20170801150755
Implantable Lead Implant Date: 20160518
Implantable Lead Implant Date: 20160518
Implantable Lead Location: 753859
Implantable Lead Location: 753860
Implantable Lead Model: 5076
Implantable Lead Model: 5076
Lead Channel Impedance Value: 342 Ohm
Lead Channel Impedance Value: 494 Ohm
Lead Channel Impedance Value: 532 Ohm
Lead Channel Impedance Value: 589 Ohm
Lead Channel Pacing Threshold Amplitude: 0.625 V
Lead Channel Pacing Threshold Amplitude: 0.75 V
Lead Channel Pacing Threshold Pulse Width: 0.4 ms
Lead Channel Pacing Threshold Pulse Width: 0.4 ms
Lead Channel Sensing Intrinsic Amplitude: 3.125 mV
Lead Channel Sensing Intrinsic Amplitude: 7.125 mV
Lead Channel Setting Pacing Amplitude: 1.5 V
Lead Channel Setting Pacing Amplitude: 2.5 V
Lead Channel Setting Pacing Pulse Width: 0.4 ms
Lead Channel Setting Sensing Sensitivity: 4 mV

## 2016-03-03 NOTE — Patient Instructions (Addendum)
Medication Instructions: - Your physician recommends that you continue on your current medications as directed. Please refer to the Current Medication list given to you today.  Labwork: - none  Procedures/Testing: - none  Follow-Up: - Remote monitoring is used to monitor your Pacemaker of ICD from home. This monitoring reduces the number of office visits required to check your device to one time per year. It allows [place] to keep an eye on the functioning of your device to ensure it is working properly. You are scheduled for a device check from home on 06/02/16. You may send your transmission at any time that day. If you have a wireless device, the transmission will be sent automatically. After your physician reviews your transmission, you will receive a postcard with your next transmission date.  - Your physician wants you to follow-up in: 1 year with Dr. Caryl Comes. You will receive a reminder letter in the mail two months in advance. If you don't receive a letter, please call our office to schedule the follow-up appointment.  Any Additional Special Instructions Will Be Listed Below (If Applicable).     If you need a refill on your cardiac medications before your next appointment, please call your pharmacy.

## 2016-03-03 NOTE — Progress Notes (Signed)
Patient Care Team: [name], MD as PCP - General (Internal Medicine)   HPI  Alexander Murray is a 75 y.o. male Seen in follow-up for symptomatic high-grade heart block. He underwent pacing.  He was seen a couple of weeks ago with first-degree AV block and some episodes of lightheadedness. It was not clear how these might be related. He is moving around as a left bundle  He is vastly improved following device implantation. There is no further dizziness. Exercise tolerance is largely resolved.  He has a history of prior retinal artery occlusion and no source was ever identified. He is on aspirin and Plavix which he takes every other day..  The patient denies chest pain, shortness of breath, nocturnal dyspnea, orthopnea or peripheral edema.  There have been no palpitations, lightheadedness or syncope.   He has noted a spell yesterday with exercise where there was an alternating hard and weaker beat associated with some discomfort involving ablation but no lightheadedness or shortness of breath  myoview 2016 Low risk stress nuclear study with small, mild, fixed anteroseptal and inferior defects (possible soft tissue attenuation); no ischemia; note patient in type 1 second degree AV block on baseline ECG; HR increased to 127 with exercise; Mobitz 1, 2:1 AV block and occasional junctional beats in recovery; patient seen by Dr Harrington Challenger prior to leaving the office.  Past Medical History:  Diagnosis Date  . Arthritis    "right knee and right thumb" (12/19/2014)  . Asthma   . Esophageal stricture   . GERD (gastroesophageal reflux disease)   . Hemochromatosis   . Hypercalcemia   . Hypercholesteremia   . Hypertension   . Osteoarthritis   . Palpitations   . Presence of permanent cardiac pacemaker   . Retinal artery occlusion, branch    "right eye"  . Second degree AV block, Mobitz type II   . Skin cancer   . Stroke [facility]) ~ 2011   "left eye"    Past Surgical History:    Procedure Laterality Date  . COLONOSCOPY    . EP IMPLANTABLE DEVICE N/A 12/19/2014   Procedure: Pacemaker Implant;  Surgeon: Deboraha Sprang, MD;  Location: [place] [facility];  Service: Cardiovascular;  Laterality: N/A;  . ESOPHAGOGASTRODUODENOSCOPY (EGD) WITH ESOPHAGEAL DILATION  X 3  . INGUINAL HERNIA REPAIR Left 1980's  . INSERT / REPLACE / REMOVE PACEMAKER  12/19/2014  . KNEE ARTHROSCOPY Right ~ 1982; ~ 1992  . MOHS SURGERY Right ~ 2014   "pre-melanoma scapula"  . POSTERIOR TIBIAL TENDON REPAIR Left 2012  . TONSILLECTOMY  1946  . TOTAL KNEE ARTHROPLASTY Right 05/20/2015   Procedure: RIGHT TOTAL KNEE ARTHROPLASTY;  Surgeon: Paralee Cancel, MD;  Location: [facility];  Service: Orthopedics;  Laterality: Right;  . UPPER GASTROINTESTINAL ENDOSCOPY      Current Outpatient Prescriptions  Medication Sig Dispense Refill  . acetaminophen (TYLENOL) 325 MG tablet Take 2 tablets (650 mg total) by mouth every 6 (six) hours as needed for mild pain or moderate pain.    Marland Kitchen amLODipine (NORVASC) 2.5 MG tablet Take 2.5 mg by mouth daily.    Marland Kitchen aspirin 81 MG tablet Take 81 mg by mouth daily.    . celecoxib (CELEBREX) 200 MG capsule Take 1 capsule (200 mg total) by mouth every 12 (twelve) hours. 60 capsule 0  . chlorthalidone (HYGROTON) 25 MG tablet Take 25 mg by mouth every other day.     . clopidogrel (PLAVIX) 75 MG tablet Take 75  mg by mouth every other day.    . doxazosin (CARDURA) 8 MG tablet Take 4 mg by mouth daily.     . Misc Natural Products (GLUCOSAMINE CHONDROITIN ADV PO) Take 1,500 mg by mouth daily.    . mometasone (NASONEX) 50 MCG/ACT nasal spray Place 2 sprays into the nose daily as needed.    . montelukast (SINGULAIR) 10 MG tablet Take 10 mg by mouth every morning.     . Multiple Vitamin (MULTIVITAMIN) capsule Take 1 capsule by mouth daily.    . simvastatin (ZOCOR) 40 MG tablet Take 40 mg by mouth daily.    . vitamin C (ASCORBIC ACID) 500 MG tablet Take 500 mg by mouth daily.      No current  facility-administered medications for this visit.     No Known Allergies  Review of Systems negative except from HPI and PMH  Physical Exam   BP 130/76 (BP Location: Left Arm, Patient Position: Sitting, Cuff Size: Normal)   Pulse 69   Ht '5\' 10"'$  (1.778 m)   Wt 173 lb (78.5 kg)   BMI 24.82 kg/m  Device pocket well healed; without hematoma or erythema.  There is no tethering  Slow and irregular   rate and rhythm, no murmurs gallops or rub Soft with active bowel sounds No clubbing cyanosis none Edema Alert and oriented, grossly normal motor and sensory function Skin Warm and Dry  ECG  sinus at 68 with Psynchchornous  pacing  Assessment and  Plan  Sinus bradycardia  Hypertension  High-grade 2:1 and 3:2 heart block  Pacemaker-[employer]   The patient's device was interrogated.  The information was reviewed. device was reprogrammed to maximize longevity   Irregular heart beats were noted during device interrogation with an atrial tachycardia and intermittent ventricular pacing and ventricular sensed events which could've been PVCs.  He is advised that overdrive suppression may be effective.  Blood work is followed by Dr. [name] closely.  Blood pressure is well-controlled

## 2016-03-25 DIAGNOSIS — Z23 Encounter for immunization: Secondary | ICD-10-CM | POA: Diagnosis not present

## 2016-05-14 DIAGNOSIS — H11432 Conjunctival hyperemia, left eye: Secondary | ICD-10-CM | POA: Diagnosis not present

## 2016-05-14 DIAGNOSIS — D485 Neoplasm of uncertain behavior of skin: Secondary | ICD-10-CM | POA: Diagnosis not present

## 2016-06-01 DIAGNOSIS — H0015 Chalazion left lower eyelid: Secondary | ICD-10-CM | POA: Diagnosis not present

## 2016-06-01 DIAGNOSIS — D485 Neoplasm of uncertain behavior of skin: Secondary | ICD-10-CM | POA: Diagnosis not present

## 2016-06-02 ENCOUNTER — Ambulatory Visit (INDEPENDENT_AMBULATORY_CARE_PROVIDER_SITE_OTHER): Payer: Medicare Other | Admitting: *Deleted

## 2016-06-02 ENCOUNTER — Encounter: Payer: Self-pay | Admitting: *Deleted

## 2016-06-02 DIAGNOSIS — I441 Atrioventricular block, second degree: Secondary | ICD-10-CM | POA: Diagnosis not present

## 2016-06-03 NOTE — Progress Notes (Signed)
Remote pacemaker transmission.   

## 2016-06-08 DIAGNOSIS — Z85828 Personal history of other malignant neoplasm of skin: Secondary | ICD-10-CM | POA: Diagnosis not present

## 2016-06-08 DIAGNOSIS — L820 Inflamed seborrheic keratosis: Secondary | ICD-10-CM | POA: Diagnosis not present

## 2016-06-08 DIAGNOSIS — D692 Other nonthrombocytopenic purpura: Secondary | ICD-10-CM | POA: Diagnosis not present

## 2016-06-08 DIAGNOSIS — L304 Erythema intertrigo: Secondary | ICD-10-CM | POA: Diagnosis not present

## 2016-06-08 DIAGNOSIS — D1801 Hemangioma of skin and subcutaneous tissue: Secondary | ICD-10-CM | POA: Diagnosis not present

## 2016-06-08 DIAGNOSIS — D3617 Benign neoplasm of peripheral nerves and autonomic nervous system of trunk, unspecified: Secondary | ICD-10-CM | POA: Diagnosis not present

## 2016-06-08 DIAGNOSIS — L570 Actinic keratosis: Secondary | ICD-10-CM | POA: Diagnosis not present

## 2016-06-08 DIAGNOSIS — L821 Other seborrheic keratosis: Secondary | ICD-10-CM | POA: Diagnosis not present

## 2016-06-10 ENCOUNTER — Encounter: Payer: Self-pay | Admitting: Cardiology

## 2016-06-23 ENCOUNTER — Encounter: Payer: Self-pay | Admitting: Internal Medicine

## 2016-07-02 LAB — CUP PACEART REMOTE DEVICE CHECK
Battery Remaining Longevity: 88 mo
Battery Voltage: 3.01 V
Brady Statistic AP VP Percent: 72.49 %
Brady Statistic AP VS Percent: 0.03 %
Brady Statistic AS VP Percent: 27.39 %
Brady Statistic AS VS Percent: 0.09 %
Brady Statistic RA Percent Paced: 72.52 %
Brady Statistic RV Percent Paced: 99.88 %
Date Time Interrogation Session: 20171031135248
Implantable Lead Implant Date: 20160518
Implantable Lead Implant Date: 20160518
Implantable Lead Location: 753859
Implantable Lead Location: 753860
Implantable Lead Model: 5076
Implantable Lead Model: 5076
Implantable Pulse Generator Implant Date: 20160518
Lead Channel Impedance Value: 361 Ohm
Lead Channel Impedance Value: 513 Ohm
Lead Channel Impedance Value: 532 Ohm
Lead Channel Impedance Value: 589 Ohm
Lead Channel Pacing Threshold Amplitude: 0.625 V
Lead Channel Pacing Threshold Amplitude: 0.875 V
Lead Channel Pacing Threshold Pulse Width: 0.4 ms
Lead Channel Pacing Threshold Pulse Width: 0.4 ms
Lead Channel Sensing Intrinsic Amplitude: 10.375 mV
Lead Channel Sensing Intrinsic Amplitude: 10.375 mV
Lead Channel Sensing Intrinsic Amplitude: 4.375 mV
Lead Channel Sensing Intrinsic Amplitude: 4.375 mV
Lead Channel Setting Pacing Amplitude: 1.75 V
Lead Channel Setting Pacing Amplitude: 2.5 V
Lead Channel Setting Pacing Pulse Width: 0.4 ms
Lead Channel Setting Sensing Sensitivity: 4 mV

## 2016-07-03 DIAGNOSIS — J40 Bronchitis, not specified as acute or chronic: Secondary | ICD-10-CM

## 2016-07-03 HISTORY — DX: Bronchitis, not specified as acute or chronic: J40

## 2016-07-07 ENCOUNTER — Encounter: Payer: Self-pay | Admitting: Nurse Practitioner

## 2016-07-07 ENCOUNTER — Ambulatory Visit (INDEPENDENT_AMBULATORY_CARE_PROVIDER_SITE_OTHER): Payer: Medicare Other | Admitting: Nurse Practitioner

## 2016-07-07 VITALS — BP 128/76 | HR 74 | Ht 70.5 in | Wt 175.0 lb

## 2016-07-07 DIAGNOSIS — Z7901 Long term (current) use of anticoagulants: Secondary | ICD-10-CM | POA: Diagnosis not present

## 2016-07-07 DIAGNOSIS — Z86010 Personal history of colonic polyps: Secondary | ICD-10-CM | POA: Diagnosis not present

## 2016-07-07 NOTE — Patient Instructions (Addendum)
It has been recommended to you by your physician that you have a(n) colonoscopy completed. Per your request, we did not schedule the procedure(s) today. Please contact our office at [phone] should you decide to have the procedure completed. 

## 2016-07-08 NOTE — Progress Notes (Signed)
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 Sharlett Iles for a personal history of adenomatous colon polyps and a [facility] of colon cancer in both parents. Patient's last surveillance colonoscopy was early Jan 2018 at which time 4 small cecal polyps were removed. Patient has no GI symptoms. Specifically no abdominal pain, bowel changes, blood in stool or involuntary weight loss.   Past Medical History:  Diagnosis Date  . Arthritis    "right knee and right thumb" (12/19/2014)  . Asthma   . Esophageal stricture   . GERD (gastroesophageal reflux disease)   . Hemochromatosis   . Hypercalcemia   . Hypercholesteremia   . Hypertension   . Osteoarthritis   . Palpitations   . Presence of permanent cardiac pacemaker   . Retinal artery occlusion, branch    "right eye"  . Second degree AV block, Mobitz type II   . Skin cancer   . Stroke [facility]) ~ 2011   "left eye"    Past Surgical History:  Procedure Laterality Date  . COLONOSCOPY    . EP IMPLANTABLE DEVICE N/A 12/19/2014   Procedure: Pacemaker Implant;  Surgeon: Deboraha Sprang, MD;  Location: [place] [facility];  Service: Cardiovascular;  Laterality: N/A;  . ESOPHAGOGASTRODUODENOSCOPY (EGD) WITH ESOPHAGEAL DILATION  X 3  . INGUINAL HERNIA REPAIR Left 1980's  . INSERT / REPLACE / REMOVE PACEMAKER  12/19/2014  . KNEE ARTHROSCOPY Right ~ 1982; ~ 1992  . MOHS SURGERY Right ~ 2014   "pre-melanoma scapula"  . POSTERIOR TIBIAL TENDON REPAIR Left 2012  . TONSILLECTOMY  1946  . TOTAL KNEE ARTHROPLASTY Right 05/20/2015   Procedure: RIGHT TOTAL KNEE ARTHROPLASTY;  Surgeon: Paralee Cancel, MD;  Location: [facility];  Service: Orthopedics;  Laterality: Right;  . UPPER GASTROINTESTINAL ENDOSCOPY       Current Outpatient Prescriptions  Medication Sig Dispense Refill  . acetaminophen (TYLENOL) 325 MG tablet Take 2 tablets (650 mg total) by mouth every 6 (six) hours as needed for mild pain or moderate pain.    Marland Kitchen amLODipine  (NORVASC) 2.5 MG tablet Take 2.5 mg by mouth daily.    Marland Kitchen aspirin 81 MG tablet Take 81 mg by mouth daily.    . celecoxib (CELEBREX) 200 MG capsule Take 1 capsule (200 mg total) by mouth every 12 (twelve) hours. (Patient taking differently: Take 200 mg by mouth every 12 (twelve) hours. Takes as needed.) 60 capsule 0  . chlorthalidone (HYGROTON) 25 MG tablet Take 25 mg by mouth every other day.     . clopidogrel (PLAVIX) 75 MG tablet Take 75 mg by mouth every other day.    . doxazosin (CARDURA) 8 MG tablet Take 4 mg by mouth daily.     . Misc Natural Products (GLUCOSAMINE CHONDROITIN ADV PO) Take 1,500 mg by mouth daily.    . mometasone (NASONEX) 50 MCG/ACT nasal spray Place 2 sprays into the nose daily as needed.    . montelukast (SINGULAIR) 10 MG tablet Take 10 mg by mouth every morning.     . Multiple Vitamin (MULTIVITAMIN) capsule Take 1 capsule by mouth daily.    Marland Kitchen omeprazole (PRILOSEC) 20 MG capsule Take 20 mg by mouth daily.    . simvastatin (ZOCOR) 40 MG tablet Take 40 mg by mouth daily.    . vitamin C (ASCORBIC ACID) 500 MG tablet Take 500 mg by mouth daily.      No current facility-administered medications for this visit.  No Known Allergies  Physical Exam: BP 128/76   Pulse 74   Ht 5' 10.5" (1.791 m)   Wt 175 lb (79.4 kg)   BMI 24.75 kg/m  Constitutional:  Well-developed, white male in no acute distress. Psychiatric: Normal mood and affect. Behavior is normal. HEENT: Normocephalic and atraumatic. Conjunctivae are normal. No scleral icterus. Neck supple.  Cardiovascular: Normal rate, regular rhythm.  Pulmonary/chest: Effort normal and breath sounds normal. No wheezing, rales or rhonchi. Abdominal: Soft, nondistended, nontender. Bowel sounds active throughout. There are no masses palpable. No hepatomegaly. Extremities: no edema Lymphadenopathy: No cervical adenopathy noted. Neurological: Alert and oriented to person place and time. Skin: Skin is warm and dry. No rashes  noted.   ASSESSMENT AND PLAN:  1. 75 yo male with personal hx of adenomatous colon polyps and [place] of colon cancer in both parents, due for surveillance colonoscopy. Four small cecal polyps removed at time of last colonoscopy in 2013, path was for only 3 polyps but all were adenomatous. Patient has no abdominal pain, blood in stools, weight loss or other alarming features.  -Patient will be scheduled for a colonoscopy with possible polypectomy by Dr. Henrene Pastor (patient request)  The risks and benefits of the procedure were discussed and the patient agrees to proceed. He will need to be off plavix for the procedure.   2. GERD. Several EGDs through the years. No Barrett's ever seen despite endoscopic suggestion of it. Last EGD Jan 2013- normal esophagus, large inlet patch in cervical esophagus. Patient takes a daily PPI. He has chronic mild dysphagia, unchanged through the years so no need to repeat EGD at present.   3. Hx of retinal artery branch occlusion in right eye. On chronic plavix which will be held for 5 days prior to colonoscopy - will instruct when and how to resume after procedure.  The patient consents to proceed. Will communicate by phone or EMR with patient's prescribing provider to confirm that holding plavix is reasonable in this case.    Tye Savoy  07/08/2016, 2:40 PM   [name], MD

## 2016-07-09 NOTE — Progress Notes (Signed)
Agree with initial assessment and plans 

## 2016-07-10 DIAGNOSIS — J0100 Acute maxillary sinusitis, unspecified: Secondary | ICD-10-CM | POA: Diagnosis not present

## 2016-07-10 DIAGNOSIS — I10 Essential (primary) hypertension: Secondary | ICD-10-CM | POA: Diagnosis not present

## 2016-07-10 DIAGNOSIS — R05 Cough: Secondary | ICD-10-CM | POA: Diagnosis not present

## 2016-07-10 DIAGNOSIS — R0609 Other forms of dyspnea: Secondary | ICD-10-CM | POA: Diagnosis not present

## 2016-07-10 DIAGNOSIS — Z6825 Body mass index (BMI) 25.0-25.9, adult: Secondary | ICD-10-CM | POA: Diagnosis not present

## 2016-07-20 DIAGNOSIS — Z6824 Body mass index (BMI) 24.0-24.9, adult: Secondary | ICD-10-CM | POA: Diagnosis not present

## 2016-07-20 DIAGNOSIS — R05 Cough: Secondary | ICD-10-CM | POA: Diagnosis not present

## 2016-07-20 DIAGNOSIS — I10 Essential (primary) hypertension: Secondary | ICD-10-CM | POA: Diagnosis not present

## 2016-08-12 ENCOUNTER — Encounter: Payer: Self-pay | Admitting: Internal Medicine

## 2016-08-12 ENCOUNTER — Telehealth: Payer: Self-pay | Admitting: Internal Medicine

## 2016-08-12 NOTE — Telephone Encounter (Signed)
Per OV note with Nevin Bloodgood pt is ok to schedule colon. He will need a previsit and colon. Note has been sent to Dr. Caryl Comes to confirm holding plavix prior to procedure. Please go ahead with scheduling appointments.

## 2016-08-13 NOTE — Telephone Encounter (Signed)
Pt is scheduled for colon with Dr. Henrene Pastor 10/27/16. Pt is currently taking Plavix. Please advise if ok for pt to hold Plavix for procedure and for how long.

## 2016-08-13 NOTE — Telephone Encounter (Signed)
plz followup with pt.  According to the records he takes plavix qod for neurological event so not sure who prescribed this but it was not I  Alexander Murray may  nknow

## 2016-08-14 NOTE — Telephone Encounter (Signed)
Noted plans for Dr. Lasandra Beech colonoscopy and decision by prescribing physician regarding his Plavix therapy

## 2016-08-14 NOTE — Telephone Encounter (Signed)
Spoke with pt and he gets his plavix renewed by Dr. [name] with [facility]. Will send Dr. [name] a letter.

## 2016-09-01 ENCOUNTER — Ambulatory Visit (INDEPENDENT_AMBULATORY_CARE_PROVIDER_SITE_OTHER): Payer: Medicare Other | Admitting: *Deleted

## 2016-09-01 DIAGNOSIS — I441 Atrioventricular block, second degree: Secondary | ICD-10-CM

## 2016-09-01 NOTE — Progress Notes (Signed)
Remote pacemaker transmission.   

## 2016-09-02 ENCOUNTER — Encounter: Payer: Self-pay | Admitting: Cardiology

## 2016-09-04 NOTE — Telephone Encounter (Signed)
Received call from Dr. Shon Baton nurse with [facility]. Per Dr. [name] pt may hold his Plavix for 5 days prior to the procedure. Fax received from office and sent to be scanned.

## 2016-09-06 LAB — CUP PACEART REMOTE DEVICE CHECK
Battery Remaining Longevity: 88 mo
Battery Voltage: 3.01 V
Brady Statistic AP VP Percent: 68.08 %
Brady Statistic AP VS Percent: 0.03 %
Brady Statistic AS VP Percent: 31.81 %
Brady Statistic AS VS Percent: 0.08 %
Brady Statistic RA Percent Paced: 68 %
Brady Statistic RV Percent Paced: 99.85 %
Date Time Interrogation Session: 20180130153551
Implantable Lead Implant Date: 20160518
Implantable Lead Implant Date: 20160518
Implantable Lead Location: 753859
Implantable Lead Location: 753860
Implantable Lead Model: 5076
Implantable Lead Model: 5076
Implantable Pulse Generator Implant Date: 20160518
Lead Channel Impedance Value: 342 Ohm
Lead Channel Impedance Value: 437 Ohm
Lead Channel Impedance Value: 475 Ohm
Lead Channel Impedance Value: 494 Ohm
Lead Channel Pacing Threshold Amplitude: 0.625 V
Lead Channel Pacing Threshold Amplitude: 0.875 V
Lead Channel Pacing Threshold Pulse Width: 0.4 ms
Lead Channel Pacing Threshold Pulse Width: 0.4 ms
Lead Channel Sensing Intrinsic Amplitude: 3 mV
Lead Channel Sensing Intrinsic Amplitude: 3 mV
Lead Channel Sensing Intrinsic Amplitude: 5.5 mV
Lead Channel Sensing Intrinsic Amplitude: 5.5 mV
Lead Channel Setting Pacing Amplitude: 1.75 V
Lead Channel Setting Pacing Amplitude: 2.5 V
Lead Channel Setting Pacing Pulse Width: 0.4 ms
Lead Channel Setting Sensing Sensitivity: 4 mV

## 2016-09-21 DIAGNOSIS — J069 Acute upper respiratory infection, unspecified: Secondary | ICD-10-CM | POA: Diagnosis not present

## 2016-09-23 ENCOUNTER — Encounter: Payer: Self-pay | Admitting: Pulmonary Disease

## 2016-09-28 ENCOUNTER — Institutional Professional Consult (permissible substitution): Payer: Medicare Other | Admitting: Pulmonary Disease

## 2016-10-13 ENCOUNTER — Ambulatory Visit (AMBULATORY_SURGERY_CENTER): Payer: Self-pay

## 2016-10-13 ENCOUNTER — Ambulatory Visit (INDEPENDENT_AMBULATORY_CARE_PROVIDER_SITE_OTHER): Payer: Medicare Other | Admitting: Internal Medicine

## 2016-10-13 ENCOUNTER — Encounter: Payer: Self-pay | Admitting: Internal Medicine

## 2016-10-13 VITALS — Ht 70.5 in | Wt 171.4 lb

## 2016-10-13 VITALS — BP 130/80 | HR 73 | Ht 70.5 in | Wt 171.0 lb

## 2016-10-13 DIAGNOSIS — Z800 Family history of malignant neoplasm of digestive organs: Secondary | ICD-10-CM

## 2016-10-13 DIAGNOSIS — Z125 Encounter for screening for malignant neoplasm of prostate: Secondary | ICD-10-CM | POA: Diagnosis not present

## 2016-10-13 DIAGNOSIS — R0982 Postnasal drip: Secondary | ICD-10-CM

## 2016-10-13 DIAGNOSIS — J40 Bronchitis, not specified as acute or chronic: Secondary | ICD-10-CM | POA: Insufficient documentation

## 2016-10-13 DIAGNOSIS — I10 Essential (primary) hypertension: Secondary | ICD-10-CM | POA: Diagnosis not present

## 2016-10-13 DIAGNOSIS — E782 Mixed hyperlipidemia: Secondary | ICD-10-CM | POA: Diagnosis not present

## 2016-10-13 DIAGNOSIS — Z86010 Personal history of colonic polyps: Secondary | ICD-10-CM

## 2016-10-13 MED ORDER — NA SULFATE-K SULFATE-MG SULF 17.5-3.13-1.6 GM/177ML PO SOLN: ORAL | 0 refills | Status: DC

## 2016-10-13 NOTE — Patient Instructions (Signed)
Sample Dymista nasal spray    1-2 puffs each nostril once or twice daily as needed for nasal drainage  Ok to use the benzonatate perles, cough syrup and Advair diskus inhaler that you have at home, if needed  Please call if we can help

## 2016-10-13 NOTE — Assessment & Plan Note (Signed)
He describes past tendency to develop sustained difficult cough after viral upper respiratory infections. There may be components of sustained postnasal drip, upper airway cough syndrome, cough-induced reflux. He didn't recognize obvious benefit from Advair. Future trial of a LABA/LAMA can be considered. He doesn't think he needs intervention now. Consider office spirometry in the future.

## 2016-10-13 NOTE — Progress Notes (Signed)
Per pt, no allergies to soy or egg products.Pt not taking any weight loss meds or using  O2 at home. 

## 2016-10-13 NOTE — Progress Notes (Signed)
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 Had taken tussinex which did help. Pt states he cough has now cleared.  He reports a tendency to develop hard cough with viral respiratory infections. He had mid December 2017 he developed a typical viral URI syndrome with cough which progressed to be really violent, not well controlled even by Tussionex. He was treated with antibiotics and an Advair inhaler, and had chest x-ray at [facility] he says showed bronchitis but nothing else. Cough gradually resolved by the end of January. He caught another cold from his wife a few weeks later associated with mild cough which is now essentially gone. He still has some sense of postnasal drip and throat clearing. Fever, purulent sputum, wheezing and reflux were not noted or significant. He has what sounds like a codeine-based cough syrup and some Tessalon Perles left available if needed. He wanted to keep this appointment to get established here in case needed in the future. Medical problems include history of GERD/esophageal stricture, hemochromatosis, chronic iron deficiency anemia, heart block/pacemaker chronic anticoagulation CXR 12/20/2014- IMPRESSION: 1. Cardiac pacer noted with lead tips in right atrium right ventricle. Cardiomegaly with normal pulmonary vascularity. Pericardial calcification consistent with prior pericarditis noted. 2. Questionable pulmonary nodule right upper lobe. This may represent overlying cardiac monitor lead. Repeat PA and lateral chest x-ray without overlying leads suggested .  Prior to Admission medications   Medication Sig Start Date End Date Taking? Authorizing Provider  acetaminophen (TYLENOL) 325 MG tablet Take 2 tablets (650 mg total) by mouth every 6 (six) hours as needed for mild pain or moderate pain. 05/21/15  Yes Matthew Babish, PA-C  amLODipine (NORVASC) 2.5 MG tablet  Take 2.5 mg by mouth daily.   Yes Historical Provider, MD  aspirin 81 MG tablet Take 81 mg by mouth daily.   Yes Historical Provider, MD  celecoxib (CELEBREX) 200 MG capsule Take 1 capsule (200 mg total) by mouth every 12 (twelve) hours. Patient taking differently: Take 200 mg by mouth every 12 (twelve) hours. Takes as needed. 05/21/15  Yes Danae Orleans, PA-C  chlorthalidone (HYGROTON) 25 MG tablet Take 25 mg by mouth every other day.    Yes Historical Provider, MD  clopidogrel (PLAVIX) 75 MG tablet Take 75 mg by mouth every other day.   Yes Historical Provider, MD  doxazosin (CARDURA) 8 MG tablet Take 4 mg by mouth daily.    Yes Historical Provider, MD  esomeprazole (NEXIUM) 20 MG packet Take 20 mg by mouth daily before breakfast.   Yes Historical Provider, MD  Misc Natural Products (GLUCOSAMINE CHONDROITIN ADV PO) Take 1,500 mg by mouth daily.   Yes Historical Provider, MD  mometasone (NASONEX) 50 MCG/ACT nasal spray Place 2 sprays into the nose daily as needed.   Yes Historical Provider, MD  montelukast (SINGULAIR) 10 MG tablet Take 10 mg by mouth every morning.  07/05/13  Yes Historical Provider, MD  Multiple Vitamin (MULTIVITAMIN) capsule Take 1 capsule by mouth daily.   Yes Historical Provider, MD  omeprazole (PRILOSEC) 20 MG capsule Take 20 mg by mouth daily.   Yes Historical Provider, MD  simvastatin (ZOCOR) 40 MG tablet Take 40 mg by mouth daily.   Yes Historical Provider, MD  vitamin C (ASCORBIC ACID) 500 MG tablet Take 500 mg by mouth daily.    Yes Historical Provider, MD  Na Sulfate-K Sulfate-Mg Sulf (SUPREP BOWEL PREP KIT) 17.5-3.13-1.6 GM/180ML SOLN Suprep as  directed / no substitutions Patient not taking: Reported on 10/13/2016 10/13/16   Irene Shipper, MD   Past Medical History:  Diagnosis Date  . Arthritis    "right knee and right thumb" (12/19/2014)  . Asthma   . Esophageal stricture   . GERD (gastroesophageal reflux disease)   . Hemochromatosis   . Hypercalcemia   .  Hypercholesteremia   . Hypertension   . Osteoarthritis   . Palpitations   . Presence of permanent cardiac pacemaker    hx bradycardia  . Retinal artery occlusion, branch    "right eye"  . Second degree AV block, Mobitz type II   . Skin cancer    right shoulder  . Stroke [facility]) ~ 2011   "right eye"/ partial blindness   Past Surgical History:  Procedure Laterality Date  . COLONOSCOPY    . EP IMPLANTABLE DEVICE N/A 12/19/2014   Procedure: Pacemaker Implant;  Surgeon: Deboraha Sprang, MD;  Location: [place] [facility];  Service: Cardiovascular;  Laterality: N/A;  . ESOPHAGOGASTRODUODENOSCOPY (EGD) WITH ESOPHAGEAL DILATION  X 3  . INGUINAL HERNIA REPAIR Left 1980's  . INSERT / REPLACE / REMOVE PACEMAKER  12/19/2014  . KNEE ARTHROSCOPY Right ~ 1982; ~ 1992  . MOHS SURGERY Right ~ 2014   "pre-melanoma scapula"  . POSTERIOR TIBIAL TENDON REPAIR Left 2012  . TONSILLECTOMY  1946  . TOTAL KNEE ARTHROPLASTY Right 05/20/2015   Procedure: RIGHT TOTAL KNEE ARTHROPLASTY;  Surgeon: Paralee Cancel, MD;  Location: [facility];  Service: Orthopedics;  Laterality: Right;  . UPPER GASTROINTESTINAL ENDOSCOPY     Family History  Problem Relation Age of Onset  . Colon cancer Mother   . Heart disease Mother   . Alzheimer's disease Mother   . Colon cancer Father   . Heart disease Father   . Prostate cancer Father   . Prostate cancer Brother   . Hemochromatosis Brother    Social History   Social History  . Marital status: Married    Spouse name: N/A  . Number of children: N/A  . Years of education: N/A   Occupational History  . retired - Doctor, general practice    Social History Main Topics  . Smoking status: Former Smoker    Years: 3.00    Types: Cigarettes    Quit date: 05/16/1962  . Smokeless tobacco: Never Used     Comment: "quit smoking in the 1960's"  . Alcohol use No     Comment: 12/19/2014 "stopped drinking in ~ 2012"  . Drug use: No  . Sexual activity: Yes   Other Topics Concern  . Not  on file   Social History Narrative  . No narrative on file   ROS-see HPI    "+" = pos Constitutional:    weight loss, night sweats, fevers, chills, fatigue, lassitude. HEENT:    headaches, difficulty swallowing, tooth/dental problems, sore throat,       sneezing, itching, ear ache, nasal congestion, post nasal drip, snoring CV:    chest pain, orthopnea, PND, swelling in lower extremities, anasarca,                                                        dizziness, palpitations Resp:   shortness of breath with exertion or at rest.  productive cough,   +non-productive cough, coughing up of blood.              change in color of mucus.  wheezing.   Skin:    rash or lesions. GI:  No-   heartburn, indigestion, abdominal pain, nausea, vomiting, diarrhea,                 change in bowel habits, loss of appetite GU: dysuria, change in color of urine, no urgency or frequency.   flank pain. MS:   joint pain, stiffness, decreased range of motion, back pain. Neuro-     nothing unusual Psych:  change in mood or affect.  depression or anxiety.   memory loss.  OBJ- Physical Exam General- Alert, Oriented, Affect-appropriate, Distress- none acute Skin- rash-none, lesions- none, excoriation- none Lymphadenopathy- none Head- atraumatic            Eyes- Gross vision intact, PERRLA, conjunctivae and secretions clear            Ears- Hearing, canals-normal            Nose- Clear, no-Septal dev, mucus, polyps, erosion, perforation             Throat- Mallampati II , mucosa clear , drainage- none, tonsils- atrophic Neck- flexible , trachea midline, no stridor , thyroid nl, carotid no bruit Chest - symmetrical excursion , unlabored           Heart/CV- RRR , no murmur , no gallop  , no rub, nl s1 s2                           - JVD- none , edema- none, stasis changes- none, varices- none           Lung- +breath sounds relatively coarse right base/unlabored, wheeze- none, cough- none ,  dullness-none, rub- none           Chest wall-  Abd-  Br/ Gen/ Rectal- Not done, not indicated Extrem- cyanosis- none, clubbing, none, atrophy- none, strength- nl Neuro- grossly intact to observation

## 2016-10-13 NOTE — Assessment & Plan Note (Signed)
He describes lingering postnasal drip sensation with mucus in back of throat since recurrent upper respiratory infections this winter. No headache or purulent discharge and I doubt bacterial sinusitis. He does not have glaucoma and might be a candidate to try ipratropium nasal spray. Since we have samples of Dymista. nasal spray here I will let him try this. From his past history expect this problem to resolve as warm, dry weather arrives.

## 2016-10-20 DIAGNOSIS — R05 Cough: Secondary | ICD-10-CM | POA: Diagnosis not present

## 2016-10-20 DIAGNOSIS — Z1389 Encounter for screening for other disorder: Secondary | ICD-10-CM | POA: Diagnosis not present

## 2016-10-20 DIAGNOSIS — Z6824 Body mass index (BMI) 24.0-24.9, adult: Secondary | ICD-10-CM | POA: Diagnosis not present

## 2016-10-20 DIAGNOSIS — Z0000 Encounter for general adult medical examination without abnormal findings: Secondary | ICD-10-CM | POA: Diagnosis not present

## 2016-10-20 DIAGNOSIS — I10 Essential (primary) hypertension: Secondary | ICD-10-CM | POA: Diagnosis not present

## 2016-10-20 DIAGNOSIS — M1990 Unspecified osteoarthritis, unspecified site: Secondary | ICD-10-CM | POA: Diagnosis not present

## 2016-10-20 DIAGNOSIS — E782 Mixed hyperlipidemia: Secondary | ICD-10-CM | POA: Diagnosis not present

## 2016-10-22 ENCOUNTER — Institutional Professional Consult (permissible substitution): Payer: Medicare Other | Admitting: Pulmonary Disease

## 2016-10-27 ENCOUNTER — Ambulatory Visit (AMBULATORY_SURGERY_CENTER): Payer: Medicare Other | Admitting: Internal Medicine

## 2016-10-27 ENCOUNTER — Encounter: Payer: Self-pay | Admitting: Internal Medicine

## 2016-10-27 VITALS — BP 121/66 | HR 60 | Temp 98.0°F | Resp 19 | Ht 70.5 in | Wt 171.0 lb

## 2016-10-27 DIAGNOSIS — Z86010 Personal history of colonic polyps: Secondary | ICD-10-CM | POA: Diagnosis present

## 2016-10-27 DIAGNOSIS — Z800 Family history of malignant neoplasm of digestive organs: Secondary | ICD-10-CM

## 2016-10-27 DIAGNOSIS — K635 Polyp of colon: Secondary | ICD-10-CM

## 2016-10-27 DIAGNOSIS — D122 Benign neoplasm of ascending colon: Secondary | ICD-10-CM

## 2016-10-27 DIAGNOSIS — D123 Benign neoplasm of transverse colon: Secondary | ICD-10-CM

## 2016-10-27 DIAGNOSIS — D120 Benign neoplasm of cecum: Secondary | ICD-10-CM

## 2016-10-27 MED ORDER — SODIUM CHLORIDE 0.9 % IV SOLN: 500.0000 mL | INTRAVENOUS | Status: DC

## 2016-10-27 NOTE — Progress Notes (Signed)
Called to room to assist during endoscopic procedure.  Patient ID and intended procedure confirmed with present staff. Received instructions for my participation in the procedure from the performing physician.  

## 2016-10-27 NOTE — Patient Instructions (Signed)
YOU HAD AN ENDOSCOPIC PROCEDURE TODAY AT [facility]:   Refer to the procedure report that was given to you for any specific questions about what was found during the examination.  If the procedure report does not answer your questions, please call your gastroenterologist to clarify.  If you requested that your care partner not be given the details of your procedure findings, then the procedure report has been included in a sealed envelope for you to review at your convenience later.  YOU SHOULD EXPECT: Some feelings of bloating in the abdomen. Passage of more gas than usual.  Walking can help get rid of the air that was put into your GI tract during the procedure and reduce the bloating. If you had a lower endoscopy (such as a colonoscopy or flexible sigmoidoscopy) you may notice spotting of blood in your stool or on the toilet paper. If you underwent a bowel prep for your procedure, you may not have a normal bowel movement for a few days.  Please Note:  You might notice some irritation and congestion in your nose or some drainage.  This is from the oxygen used during your procedure.  There is no need for concern and it should clear up in a day or so.  SYMPTOMS TO REPORT IMMEDIATELY:   Following lower endoscopy (colonoscopy or flexible sigmoidoscopy):  Excessive amounts of blood in the stool  Significant tenderness or worsening of abdominal pains  Swelling of the abdomen that is new, acute  Fever of 100F or higher    For urgent or emergent issues, a gastroenterologist can be reached at any hour by calling [phone].   DIET:  We do recommend a small meal at first, but then you may proceed to your regular diet.  Drink plenty of fluids but you should avoid alcoholic beverages for 24 hours.  ACTIVITY:  You should plan to take it easy for the rest of today and you should NOT DRIVE or use heavy machinery until tomorrow (because of the sedation medicines used during the test).     FOLLOW UP: Our staff will call the number listed on your records the next business day following your procedure to check on you and address any questions or concerns that you may have regarding the information given to you following your procedure. If we do not reach you, we will leave a message.  However, if you are feeling well and you are not experiencing any problems, there is no need to return our call.  We will assume that you have returned to your regular daily activities without incident.  If any biopsies were taken you will be contacted by phone or by letter within the next 1-3 weeks.  Please call us at [phone] if you have not heard about the biopsies in 3 weeks.    SIGNATURES/CONFIDENTIALITY: You and/or your care partner have signed paperwork which will be entered into your electronic medical record.  These signatures attest to the fact that that the information above on your After Visit Summary has been reviewed and is understood.  Full responsibility of the confidentiality of this discharge information lies with you and/or your care-partner.   INFORMATION ON POLYPS GIVEN TO YOU TODAY  RESUME PLAVIX TODAY  AWAIT PATHOLOGY RESULTS  REPEAT COLONOSCOPY IN 3 YEARS

## 2016-10-27 NOTE — Op Note (Signed)
[facility] Patient Name: Alexander Murray Procedure Date: 10/27/2016 9:45 AM MRN: [id number] Endoscopist: Docia Chuck. Henrene Pastor , MD Age: 76 Referring MD:  Date of Birth: 06-08-1941 Gender: Male Account #: [id number] Procedure:                Colonoscopy, with cold snare polypectomy X9 Indications:              High risk colon cancer surveillance: Personal                            history of multiple (3 or more) adenomas. Multiple                            prior colonoscopies. Last examination January 2015                            with 4 tubular adenomas (Dr. Sharlett Iles) Medicines:                Monitored Anesthesia Care Procedure:                Pre-Anesthesia Assessment:                           - Prior to the procedure, a History and Physical                            was performed, and patient medications and                            allergies were reviewed. The patient's tolerance of                            previous anesthesia was also reviewed. The risks                            and benefits of the procedure and the sedation                            options and risks were discussed with the patient.                            All questions were answered, and informed consent                            was obtained. Prior Anticoagulants: The patient has                            taken Plavix (clopidogrel), last dose was 6 days                            prior to procedure. ASA Grade Assessment: III - A                            patient with severe systemic disease. After  reviewing the risks and benefits, the patient was                            deemed in satisfactory condition to undergo the                            procedure.                           After obtaining informed consent, the colonoscope                            was passed under direct vision. Throughout the                            procedure, the patient's blood  pressure, pulse, and                            oxygen saturations were monitored continuously. The                            Colonoscope was introduced through the anus and                            advanced to the the cecum, identified by                            appendiceal orifice and ileocecal valve. The                            ileocecal valve, appendiceal orifice, and rectum                            were photographed. The quality of the bowel                            preparation was excellent. The colonoscopy was                            performed without difficulty. The patient tolerated                            the procedure well. The bowel preparation used was                            SUPREP. Scope In: 9:57:38 AM Scope Out: 10:23:54 AM Scope Withdrawal Time: 0 hours 18 minutes 31 seconds  Total Procedure Duration: 0 hours 26 minutes 16 seconds  Findings:                 Nine polyps were found in the transverse colon,                            ascending colon and ileocecal valve. The polyps  were 2 to 5 mm in size. These polyps were removed                            with a cold snare. Resection and retrieval were                            complete.                           The exam was otherwise without abnormality on                            direct and retroflexion views. Complications:            No immediate complications. Estimated blood loss:                            None. Estimated Blood Loss:     Estimated blood loss: none. Impression:               - Nine 2 to 5 mm polyps in the transverse colon, in                            the ascending colon and at the ileocecal valve,                            removed with a cold snare. Resected and retrieved.                           - The examination was otherwise normal on direct                            and retroflexion views. Recommendation:           - Repeat colonoscopy  in 3 years for surveillance.                           - Patient has a contact number available for                            emergencies. The signs and symptoms of potential                            delayed complications were discussed with the                            patient. Return to normal activities tomorrow.                            Written discharge instructions were provided to the                            patient.                           - Resume  previous diet.                           - Continue present medications. Resume Plavix today.                           - Await pathology results. Docia Chuck. Henrene Pastor, MD 10/27/2016 10:33:15 AM This report has been signed electronically.

## 2016-10-27 NOTE — Progress Notes (Signed)
To Pacu report to RN VSS

## 2016-10-28 ENCOUNTER — Telehealth: Payer: Self-pay

## 2016-10-28 NOTE — Telephone Encounter (Signed)
  Follow up Call-  Call back number 10/27/2016  Post procedure Call Back phone  # [phone]  Permission to leave phone message Yes  Some recent data might be hidden     Patient questions:  Do you have a fever, pain , or abdominal swelling? No. Pain Score  0 *  Have you tolerated food without any problems? Yes.    Have you been able to return to your normal activities? Yes.    Do you have any questions about your discharge instructions: Diet   No. Medications  No. Follow up visit  No.  Do you have questions or concerns about your Care? No.  Actions: * If pain score is 4 or above: No action needed, pain <4.  No problems noted per pt. maw

## 2016-10-29 ENCOUNTER — Encounter: Payer: Self-pay | Admitting: Internal Medicine

## 2016-11-23 DIAGNOSIS — L570 Actinic keratosis: Secondary | ICD-10-CM | POA: Diagnosis not present

## 2016-11-23 DIAGNOSIS — D225 Melanocytic nevi of trunk: Secondary | ICD-10-CM | POA: Diagnosis not present

## 2016-11-23 DIAGNOSIS — Z85828 Personal history of other malignant neoplasm of skin: Secondary | ICD-10-CM | POA: Diagnosis not present

## 2016-11-23 DIAGNOSIS — L853 Xerosis cutis: Secondary | ICD-10-CM | POA: Diagnosis not present

## 2016-11-23 DIAGNOSIS — D485 Neoplasm of uncertain behavior of skin: Secondary | ICD-10-CM | POA: Diagnosis not present

## 2016-11-23 DIAGNOSIS — D0461 Carcinoma in situ of skin of right upper limb, including shoulder: Secondary | ICD-10-CM | POA: Diagnosis not present

## 2016-11-23 DIAGNOSIS — D2261 Melanocytic nevi of right upper limb, including shoulder: Secondary | ICD-10-CM | POA: Diagnosis not present

## 2016-11-23 DIAGNOSIS — Z85820 Personal history of malignant melanoma of skin: Secondary | ICD-10-CM | POA: Diagnosis not present

## 2016-11-23 DIAGNOSIS — L821 Other seborrheic keratosis: Secondary | ICD-10-CM | POA: Diagnosis not present

## 2016-11-23 DIAGNOSIS — D692 Other nonthrombocytopenic purpura: Secondary | ICD-10-CM | POA: Diagnosis not present

## 2016-11-23 DIAGNOSIS — L820 Inflamed seborrheic keratosis: Secondary | ICD-10-CM | POA: Diagnosis not present

## 2016-11-23 DIAGNOSIS — D1801 Hemangioma of skin and subcutaneous tissue: Secondary | ICD-10-CM | POA: Diagnosis not present

## 2016-12-01 ENCOUNTER — Ambulatory Visit (INDEPENDENT_AMBULATORY_CARE_PROVIDER_SITE_OTHER): Payer: Medicare Other | Admitting: *Deleted

## 2016-12-01 DIAGNOSIS — I441 Atrioventricular block, second degree: Secondary | ICD-10-CM

## 2016-12-01 NOTE — Progress Notes (Signed)
Remote pacemaker transmission.   

## 2016-12-02 ENCOUNTER — Encounter: Payer: Self-pay | Admitting: Cardiology

## 2016-12-02 LAB — CUP PACEART REMOTE DEVICE CHECK
Battery Remaining Longevity: 82 mo
Battery Voltage: 3.01 V
Brady Statistic AP VP Percent: 65.61 %
Brady Statistic AP VS Percent: 0.05 %
Brady Statistic AS VP Percent: 34.06 %
Brady Statistic AS VS Percent: 0.28 %
Brady Statistic RA Percent Paced: 65.45 %
Brady Statistic RV Percent Paced: 99.54 %
Date Time Interrogation Session: 20180501123834
Implantable Lead Implant Date: 20160518
Implantable Lead Implant Date: 20160518
Implantable Lead Location: 753859
Implantable Lead Location: 753860
Implantable Lead Model: 5076
Implantable Lead Model: 5076
Implantable Pulse Generator Implant Date: 20160518
Lead Channel Impedance Value: 342 Ohm
Lead Channel Impedance Value: 456 Ohm
Lead Channel Impedance Value: 494 Ohm
Lead Channel Impedance Value: 513 Ohm
Lead Channel Pacing Threshold Amplitude: 0.625 V
Lead Channel Pacing Threshold Amplitude: 0.75 V
Lead Channel Pacing Threshold Pulse Width: 0.4 ms
Lead Channel Pacing Threshold Pulse Width: 0.4 ms
Lead Channel Sensing Intrinsic Amplitude: 3.125 mV
Lead Channel Sensing Intrinsic Amplitude: 3.125 mV
Lead Channel Sensing Intrinsic Amplitude: 7.25 mV
Lead Channel Sensing Intrinsic Amplitude: 7.25 mV
Lead Channel Setting Pacing Amplitude: 1.5 V
Lead Channel Setting Pacing Amplitude: 2.5 V
Lead Channel Setting Pacing Pulse Width: 0.4 ms
Lead Channel Setting Sensing Sensitivity: 4 mV

## 2016-12-21 ENCOUNTER — Telehealth: Payer: Self-pay | Admitting: Internal Medicine

## 2016-12-21 DIAGNOSIS — M6701 Short Achilles tendon (acquired), right ankle: Secondary | ICD-10-CM | POA: Diagnosis not present

## 2016-12-21 DIAGNOSIS — M25571 Pain in right ankle and joints of right foot: Secondary | ICD-10-CM | POA: Diagnosis not present

## 2016-12-21 DIAGNOSIS — M76821 Posterior tibial tendinitis, right leg: Secondary | ICD-10-CM | POA: Diagnosis not present

## 2016-12-21 NOTE — Telephone Encounter (Signed)
Per Katie/Dr Annamaria Boots: No opening this week.  Schedule with NP

## 2016-12-21 NOTE — Telephone Encounter (Signed)
LM x 1  SCHEDULE WITH NP

## 2016-12-21 NOTE — Telephone Encounter (Signed)
Pt called back-he has been scheduled to see TP tomorrow-nothing more needed at this time.

## 2016-12-21 NOTE — Telephone Encounter (Signed)
Patient returning call - pt unable to take 2:15 appt today with TP - pt can be reached at [phone] -pr

## 2016-12-21 NOTE — Telephone Encounter (Signed)
TP please advise if able to work this patient in 12/22/16 on you schedule.   CY unable to work patient in this week.  Needs appt after 11am. Thanks.

## 2016-12-21 NOTE — Telephone Encounter (Signed)
Okay to use the 3pm hold slot tomorrow afternoon 5/22 LMOM TCB x1 for pt

## 2016-12-21 NOTE — Telephone Encounter (Signed)
Pt c/o head congestion, cough and sinus drainage. Pt notes some grey creamy mucus x 5 days ago. Pt recently traveled [place] for [place] and caught a cold the last day.  Using NyQuil PM, Sudafed and Dymista.   Requesting an appt this morning if possible.  Please advise Dr Annamaria Boots. Thanks.   Allergies as of 12/21/2016   No Known Allergies     Medication List       Accurate as of 12/21/16  9:45 AM. Always use your most recent med list.          acetaminophen 325 MG tablet Commonly known as:  TYLENOL Take 2 tablets (650 mg total) by mouth every 6 (six) hours as needed for mild pain or moderate pain.   amLODipine 2.5 MG tablet Commonly known as:  NORVASC Take 2.5 mg by mouth daily.   aspirin 81 MG tablet Take 81 mg by mouth daily.   celecoxib 200 MG capsule Commonly known as:  CELEBREX Take 1 capsule (200 mg total) by mouth every 12 (twelve) hours.   chlorthalidone 25 MG tablet Commonly known as:  HYGROTON Take 25 mg by mouth every other day.   clopidogrel 75 MG tablet Commonly known as:  PLAVIX Take 75 mg by mouth every other day.   doxazosin 8 MG tablet Commonly known as:  CARDURA Take 4 mg by mouth daily.   esomeprazole 20 MG packet Commonly known as:  NEXIUM Take 20 mg by mouth daily before breakfast.   GLUCOSAMINE CHONDROITIN ADV PO Take 1,500 mg by mouth daily.   mometasone 50 MCG/ACT nasal spray Commonly known as:  NASONEX Place 2 sprays into the nose daily as needed.   montelukast 10 MG tablet Commonly known as:  SINGULAIR Take 10 mg by mouth every morning.   multivitamin capsule Take 1 capsule by mouth daily.   simvastatin 40 MG tablet Commonly known as:  ZOCOR Take 40 mg by mouth daily.   vitamin C 500 MG tablet Commonly known as:  ASCORBIC ACID Take 500 mg by mouth daily.

## 2016-12-22 ENCOUNTER — Ambulatory Visit (INDEPENDENT_AMBULATORY_CARE_PROVIDER_SITE_OTHER): Payer: Medicare Other | Admitting: Adult Health

## 2016-12-22 ENCOUNTER — Ambulatory Visit (INDEPENDENT_AMBULATORY_CARE_PROVIDER_SITE_OTHER)
Admission: RE | Admit: 2016-12-22 | Discharge: 2016-12-22 | Disposition: A | Discharge status: Home / Self Care | Payer: Medicare Other | Source: Ambulatory Visit | Attending: Adult Health | Admitting: Adult Health

## 2016-12-22 ENCOUNTER — Encounter: Payer: Self-pay | Admitting: Adult Health

## 2016-12-22 VITALS — BP 112/62 | HR 72 | Ht 70.0 in | Wt 172.4 lb

## 2016-12-22 DIAGNOSIS — R05 Cough: Secondary | ICD-10-CM | POA: Diagnosis not present

## 2016-12-22 DIAGNOSIS — J40 Bronchitis, not specified as acute or chronic: Secondary | ICD-10-CM | POA: Diagnosis not present

## 2016-12-22 IMAGING — DX DG CHEST 2V
2 series · 2 of 2 positions shown · non-contrast
Comparison: [DATE]

CLINICAL DATA: Cough and congestion for 6 days.

EXAM:
CHEST  2 VIEW

[chest pa]
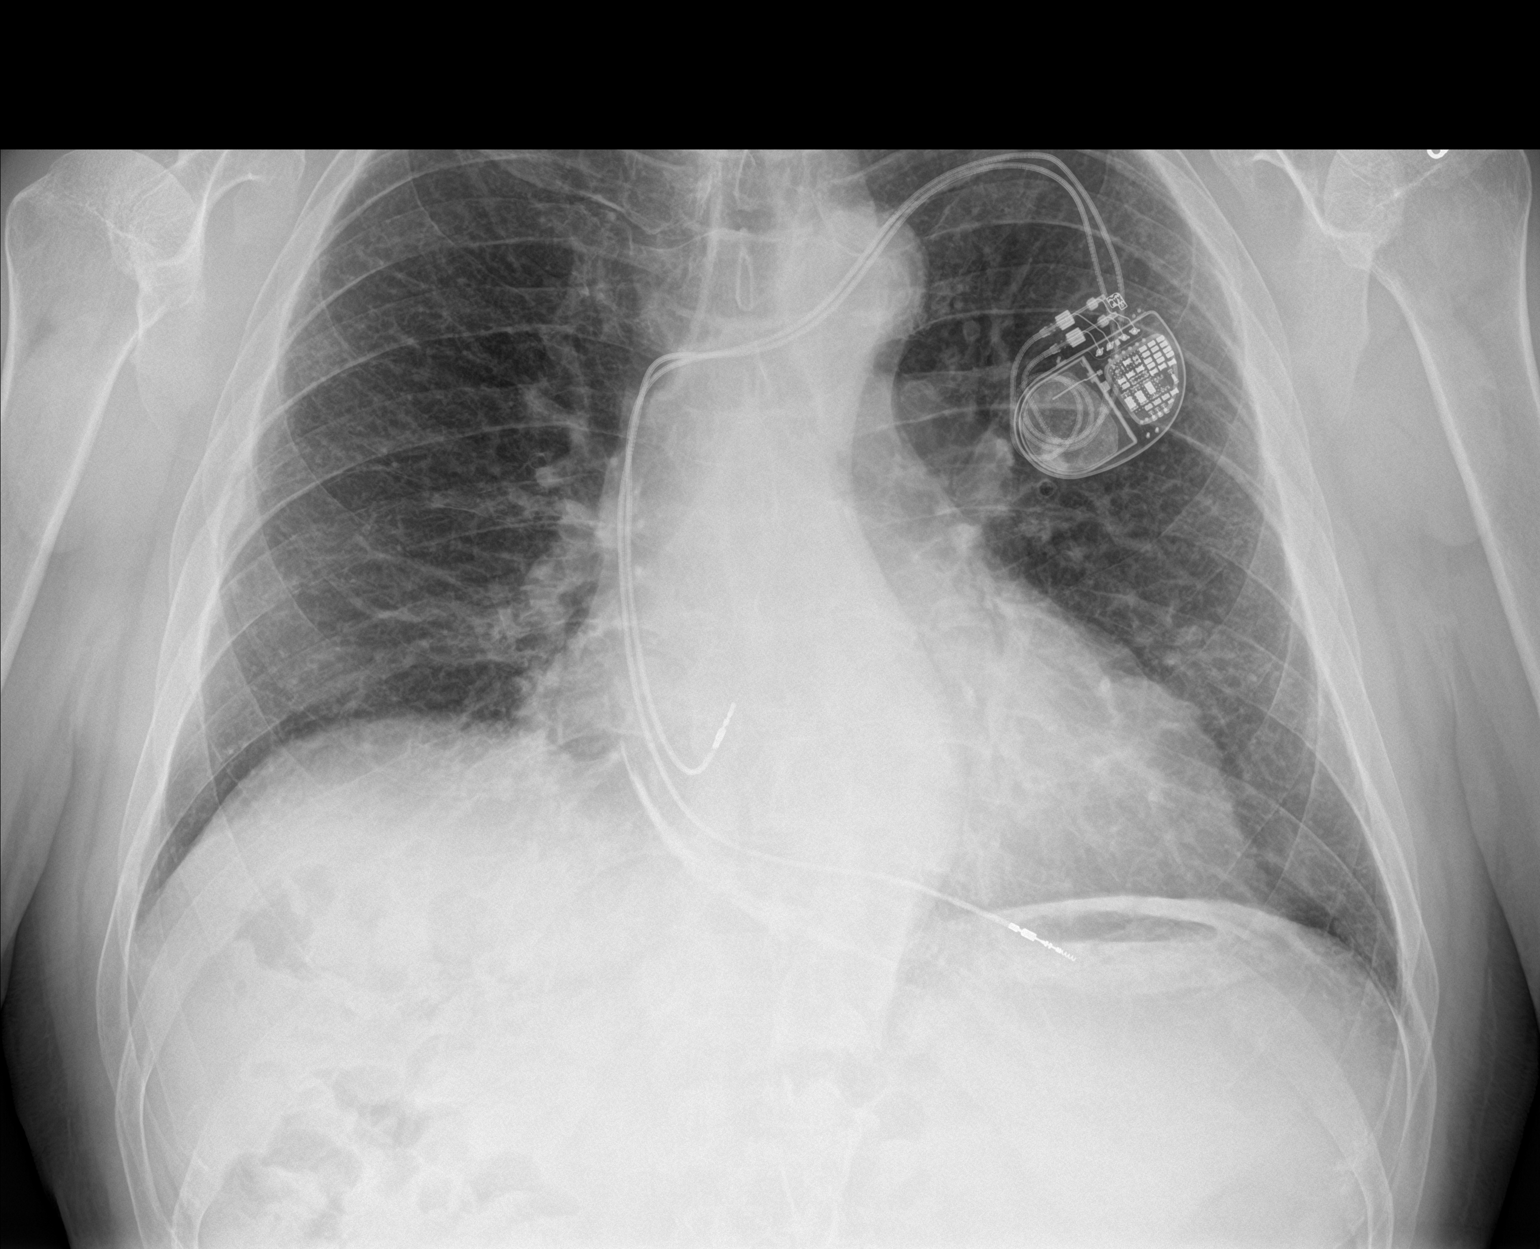

[chest lat]
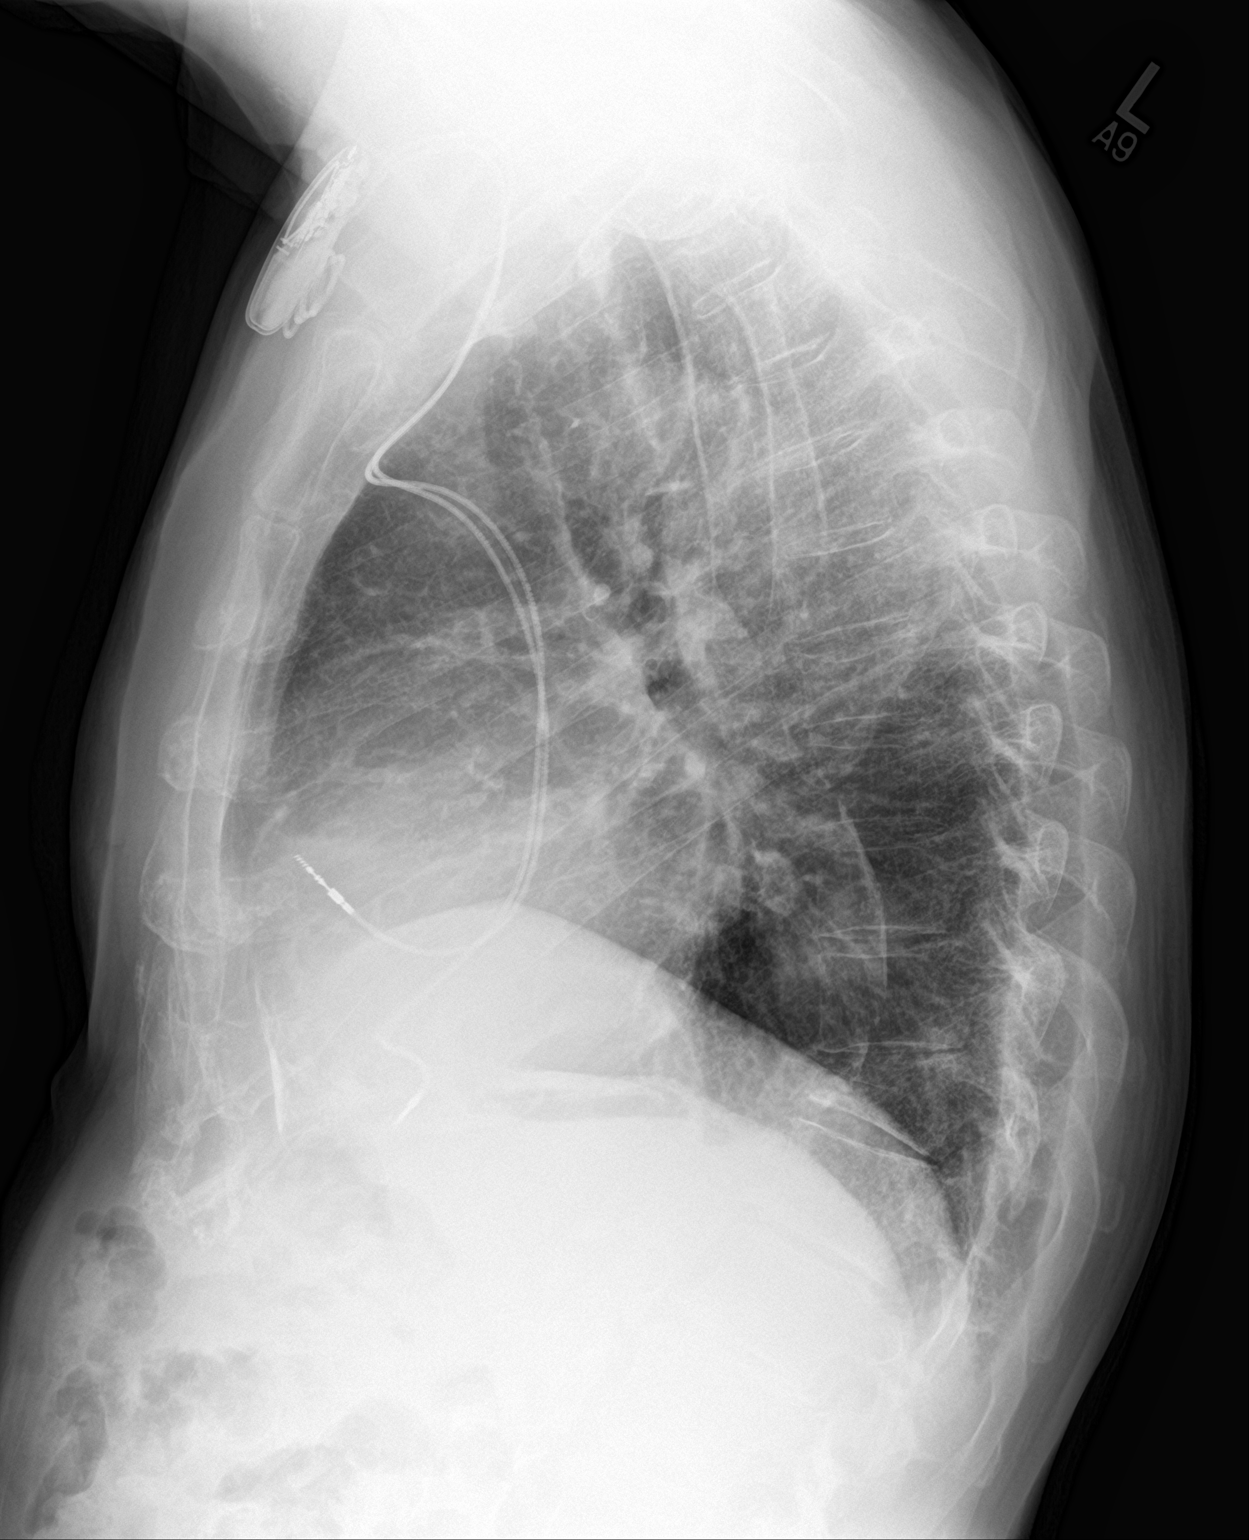

[2 of 2 positions shown; findings below may reference images not displayed]

FINDINGS: Dual lead pacemaker remains in place. Pericardial calcification is
again noted. The cardiac silhouette is mildly enlarged.
Atherosclerotic calcification and tortuosity are noted involving the
thoracic aorta. No airspace consolidation, edema, pleural effusion,
or pneumothorax is identified. The small right upper lobe nodule
questioned on the prior study is no longer clearly seen. There is a
new lower thoracic compression fracture with moderate anterior
height loss.
IMPRESSION: 1. No evidence of acute cardiopulmonary process.
2. Lower thoracic compression fracture, new from [IQ].

## 2016-12-22 MED ORDER — AMOXICILLIN-POT CLAVULANATE 875-125 MG PO TABS: 1.0000 | ORAL_TABLET | Freq: Two times a day (BID) | ORAL | 0 refills | Status: AC

## 2016-12-22 MED ORDER — BENZONATATE 200 MG PO CAPS: 200.0000 mg | ORAL_CAPSULE | Freq: Three times a day (TID) | ORAL | 2 refills | Status: DC | PRN

## 2016-12-22 NOTE — Progress Notes (Signed)
$'@Patient'v$  ID: [name], male    DOB: 07/14/1941, 76 y.o.   MRN: [id number]  Chief Complaint  Patient presents with  . 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 Complains of 1 week of cough , congestion , thick yellow mucus , drainage, aggravating cough , low energy .  Appetite is down some . No nv/d.  Last abx were in 4-5 months .  Taking Nyquil and tessalon .  Recent trip to [place] . Got back few days ago . Got sick on last day of vacation .  Patient denies chest pain, orthopnea, PND, leg swelling, calf pain, hemoptysis, orthopnea, PND or vomiting. Was seen in March for slow to resolve bronchitis . Symptoms resolved totally and was feeling good until last week.  CXR today with no acute process noted.   No Known Allergies  Immunization History  Administered Date(s) Administered  . Influenza,inj,Quad PF,36+ Mos 03/30/2016  . Influenza-Unspecified 05/03/2014  . Pneumococcal Conjugate-13 04/03/2014  . Zoster 10/02/2007    Past Medical History:  Diagnosis Date  . Arthritis    "right knee and right thumb" (12/19/2014)  . Asthma   . Bronchitis 07/2016   started in December and continued about 2 months  . Esophageal stricture   . GERD (gastroesophageal reflux disease)   . Hemochromatosis   . Hypercalcemia   . Hypercholesteremia   . Hypertension   . Osteoarthritis   . Palpitations   . Presence of permanent cardiac pacemaker    hx bradycardia  . Retinal artery occlusion, branch    "right eye"  . Second degree AV block, Mobitz type II   . Skin cancer    right shoulder  . Stroke [facility]) ~ 2011   "right eye"/ partial blindness    Tobacco History: History  Smoking Status  . Former Smoker  . Years: 3.00  . Types: Cigarettes  . Quit date:  05/16/1962  Smokeless Tobacco  . Never Used    Comment: "quit smoking in the 1960's"   Counseling given: Not Answered   Outpatient Encounter Prescriptions as of 12/22/2016  Medication Sig  . acetaminophen (TYLENOL) 325 MG tablet Take 2 tablets (650 mg total) by mouth every 6 (six) hours as needed for mild pain or moderate pain.  Marland Kitchen amLODipine (NORVASC) 2.5 MG tablet Take 2.5 mg by mouth daily.  Marland Kitchen aspirin 81 MG tablet Take 81 mg by mouth daily.  . celecoxib (CELEBREX) 200 MG capsule Take 1 capsule (200 mg total) by mouth every 12 (twelve) hours. (Patient taking differently: Take 200 mg by mouth every 12 (twelve) hours. Takes as needed.)  . chlorthalidone (HYGROTON) 25 MG tablet Take 25 mg by mouth every other day.   . clopidogrel (PLAVIX) 75 MG tablet Take 75 mg by mouth every other day.  . doxazosin (CARDURA) 8 MG tablet Take 4 mg by mouth daily.   Marland Kitchen esomeprazole (NEXIUM) 20 MG packet Take 20 mg by mouth daily before breakfast.  . Misc Natural Products (GLUCOSAMINE CHONDROITIN ADV PO) Take 1,500 mg by mouth daily.  . mometasone (NASONEX) 50 MCG/ACT nasal spray Place 2 sprays into the nose daily as needed.  . montelukast (SINGULAIR) 10 MG tablet Take 10 mg by mouth every morning.   . Multiple Vitamin (MULTIVITAMIN) capsule Take  1 capsule by mouth daily.  . simvastatin (ZOCOR) 40 MG tablet Take 40 mg by mouth daily.  . vitamin C (ASCORBIC ACID) 500 MG tablet Take 500 mg by mouth daily.   Marland Kitchen amoxicillin-clavulanate (AUGMENTIN) 875-125 MG tablet Take 1 tablet by mouth 2 (two) times daily.  . benzonatate (TESSALON) 200 MG capsule Take 1 capsule (200 mg total) by mouth 3 (three) times daily as needed for cough.   Facility-Administered Encounter Medications as of 12/22/2016  Medication  . 0.9 %  sodium chloride infusion     Review of Systems  Constitutional:   No  weight loss, night sweats,  Fevers, chills,  +fatigue, or  lassitude.  HEENT:   No headaches,  Difficulty swallowing,   Tooth/dental problems, or  Sore throat,                No sneezing, itching, ear ache,  +nasal congestion, post nasal drip,   CV:  No chest pain,  Orthopnea, PND, swelling in lower extremities, anasarca, dizziness, palpitations, syncope.   GI  No heartburn, indigestion, abdominal pain, nausea, vomiting, diarrhea, change in bowel habits, loss of appetite, bloody stools.   Resp:  .  No chest wall deformity  Skin: no rash or lesions.  GU: no dysuria, change in color of urine, no urgency or frequency.  No flank pain, no hematuria   MS:  No joint pain or swelling.  No decreased range of motion.  No back pain.    Physical Exam  BP 112/62 (BP Location: Left Arm, Cuff Size: Normal)   Pulse 72   Ht '5\' 10"'$  (1.778 m)   Wt 172 lb 6.4 oz (78.2 kg)   SpO2 96%   BMI 24.74 kg/m   GEN: A/Ox3; pleasant , NAD thin elderly    HEENT:  [place]/AT,  EACs-clear, TMs-wnl, NOSE-clear, THROAT-clear, no lesions, no postnasal drip or exudate noted.   NECK:  Supple w/ fair ROM; no JVD; normal carotid impulses w/o bruits; no thyromegaly or nodules palpated; no lymphadenopathy.    RESP  Scattered rhonchi ,  no accessory muscle use, no dullness to percussion Speaks in full sentences   CARD:  RRR, no m/r/g, no peripheral edema, pulses intact, no cyanosis or clubbing.  GI:   Soft & nt; nml bowel sounds; no organomegaly or masses detected.   Musco: Warm bil, no deformities or joint swelling noted.   Neuro: alert, no focal deficits noted.    Skin: Warm, no lesions or rashes    Lab Results:  CBCBNP No results found for: BNP  ProBNP No results found for: PROBNP  Imaging: Dg Chest 2 View  Result Date: 12/22/2016 CLINICAL DATA:  Cough and congestion for 6 days. EXAM: CHEST  2 VIEW COMPARISON:  12/20/2014 FINDINGS: Dual lead pacemaker remains in place. Pericardial calcification is again noted. The cardiac silhouette is mildly enlarged. Atherosclerotic calcification and tortuosity are noted involving the  thoracic aorta. No airspace consolidation, edema, pleural effusion, or pneumothorax is identified. The small right upper lobe nodule questioned on the prior study is no longer clearly seen. There is a new lower thoracic compression fracture with moderate anterior height loss. IMPRESSION: 1. No evidence of acute cardiopulmonary process. 2. Lower thoracic compression fracture, new from 2016. Electronically Signed   By: Logan Bores M.D.   On: 12/22/2016 15:28     Assessment & Plan:   Tracheobronchitis Exacerbation  cxr with no sign of PNA   Plan  Patient Instructions  Augmentin '875mg'$  Twice daily  For 7 days , take w/ food . Eat yogurt while taking .  Mucinex DM As needed  Cough/congestion .  Fluids and rest .  Tessalon As needed  Cough .  follow up Dr. Annamaria Boots  As planned and As needed   Please contact office for sooner follow up if symptoms do not improve or worsen or seek emergency care         Rexene Edison, NP 12/22/2016

## 2016-12-22 NOTE — Assessment & Plan Note (Signed)
Exacerbation  cxr with no sign of PNA   Plan  Patient Instructions  Augmentin '875mg'$  Twice daily  For 7 days , take w/ food . Eat yogurt while taking .  Mucinex DM As needed  Cough/congestion .  Fluids and rest .  Tessalon As needed  Cough .  follow up Dr. Annamaria Boots  As planned and As needed   Please contact office for sooner follow up if symptoms do not improve or worsen or seek emergency care

## 2016-12-22 NOTE — Patient Instructions (Addendum)
Augmentin '875mg'$  Twice daily  For 7 days , take w/ food . Eat yogurt while taking .  Mucinex DM As needed  Cough/congestion .  Fluids and rest .  Tessalon As needed  Cough .  follow up Dr. Annamaria Boots  As planned and As needed   Please contact office for sooner follow up if symptoms do not improve or worsen or seek emergency care

## 2016-12-23 DIAGNOSIS — M76821 Posterior tibial tendinitis, right leg: Secondary | ICD-10-CM | POA: Diagnosis not present

## 2016-12-29 DIAGNOSIS — H2513 Age-related nuclear cataract, bilateral: Secondary | ICD-10-CM | POA: Diagnosis not present

## 2016-12-29 DIAGNOSIS — H3411 Central retinal artery occlusion, right eye: Secondary | ICD-10-CM | POA: Diagnosis not present

## 2016-12-29 DIAGNOSIS — H5203 Hypermetropia, bilateral: Secondary | ICD-10-CM | POA: Diagnosis not present

## 2016-12-29 DIAGNOSIS — H353132 Nonexudative age-related macular degeneration, bilateral, intermediate dry stage: Secondary | ICD-10-CM | POA: Diagnosis not present

## 2017-01-01 HISTORY — PX: CATARACT EXTRACTION, BILATERAL: SHX1313

## 2017-01-14 DIAGNOSIS — H25011 Cortical age-related cataract, right eye: Secondary | ICD-10-CM | POA: Diagnosis not present

## 2017-01-14 DIAGNOSIS — H25811 Combined forms of age-related cataract, right eye: Secondary | ICD-10-CM | POA: Diagnosis not present

## 2017-01-14 DIAGNOSIS — H2511 Age-related nuclear cataract, right eye: Secondary | ICD-10-CM | POA: Diagnosis not present

## 2017-01-28 DIAGNOSIS — H2512 Age-related nuclear cataract, left eye: Secondary | ICD-10-CM | POA: Diagnosis not present

## 2017-01-28 DIAGNOSIS — H25811 Combined forms of age-related cataract, right eye: Secondary | ICD-10-CM | POA: Diagnosis not present

## 2017-02-10 ENCOUNTER — Telehealth: Payer: Self-pay | Admitting: Internal Medicine

## 2017-02-10 NOTE — Telephone Encounter (Signed)
Pt states he has been having a full feeling, reflux, no appetite, mid-abdominal pain. States he cannot eat much at all due to no appetite. Pt scheduled to see Ellouise Newer PA tomorrow at 2:45pm. Pt aware of appt.

## 2017-02-11 ENCOUNTER — Ambulatory Visit (INDEPENDENT_AMBULATORY_CARE_PROVIDER_SITE_OTHER): Payer: Medicare Other | Admitting: Physician Assistant

## 2017-02-11 ENCOUNTER — Encounter: Payer: Self-pay | Admitting: Physician Assistant

## 2017-02-11 VITALS — BP 100/64 | HR 72 | Ht 68.25 in | Wt 165.5 lb

## 2017-02-11 DIAGNOSIS — K219 Gastro-esophageal reflux disease without esophagitis: Secondary | ICD-10-CM | POA: Diagnosis not present

## 2017-02-11 DIAGNOSIS — R110 Nausea: Secondary | ICD-10-CM

## 2017-02-11 DIAGNOSIS — R630 Anorexia: Secondary | ICD-10-CM

## 2017-02-11 DIAGNOSIS — R1013 Epigastric pain: Secondary | ICD-10-CM

## 2017-02-11 NOTE — Progress Notes (Signed)
Alexander Murray, thank you for seeing [name]. I agree with your initial assessment and plans. Also, I will ask Vaughan Basta to arrange for him to have an abdominal ultrasound to rule out gallstones or other causes for his symptoms. Thanks

## 2017-02-11 NOTE — Patient Instructions (Signed)
Continue Nexium 20 mg 2 pills every morning x 2 weeks then back to daily.   If symptoms persist another 2-3 weeks let us know.

## 2017-02-11 NOTE — Progress Notes (Signed)
Chief Complaint: Epigastric pain, GERD, decrease in appetite, nausea  HPI:  Alexander Murray is a 76 year old Caucasian male with a past medical history of reflux and others listed below, who typically follows with Dr. Henrene Pastor and presents to clinic today with a complaint of acute onset epigastric pain, reflux, decrease in appetite and nausea.   Patient was most recently seen in our clinic on 10/27/16 for a colonoscopy with Dr. Henrene Pastor. This was significant for 9 polyps and patient was told to repeat colonoscopy in 3 years for surveillance.   Today, the patient presents to clinic and tells me that he was out of town and on Monday, 02/08/17 he developed low energy and no appetite, in fact did not eat much food at all, Tuesday when he did eat, he developed an epigastric abdominal pain and had reflux into the night. He tells me he was not nauseous or vomiting at that time but on Wednesday this continued and he became nauseous had a low appetite and was very lethargic, he only ate a "tes cups worth of food" all day as well as an ensure shake. He slept 12-14 hours yesterday and this morning feels somewhat normal. He did have a normal breakfast and had chik-fil-a for lunch. He does tell me that he had loosely formed stools during this time and did have some looser stool this morning, overall he feels that he is getting better. Patient did empirically increase his Nexium from 20 mg once daily to 40 mg every morning on Tuesday and Wednesday.   Patient denies fever, chills, blood in his stool, melena, weight loss, vomiting or symptoms that awaken him at night.  Past Medical History:  Diagnosis Date  . Arthritis    "right knee and right thumb" (12/19/2014)  . Asthma   . Bronchitis 07/2016   started in December and continued about 2 months  . Esophageal stricture   . GERD (gastroesophageal reflux disease)   . Hemochromatosis   . Hypercalcemia   . Hypercholesteremia   . Hypertension   . Osteoarthritis   .  Palpitations   . Presence of permanent cardiac pacemaker    hx bradycardia  . Retinal artery occlusion, branch    "right eye"  . Second degree AV block, Mobitz type II   . Skin cancer    right shoulder  . Stroke [facility]) ~ 2011   "right eye"/ partial blindness    Past Surgical History:  Procedure Laterality Date  . CATARACT EXTRACTION, BILATERAL  01/2017  . COLONOSCOPY    . EP IMPLANTABLE DEVICE N/A 12/19/2014   Procedure: Pacemaker Implant;  Surgeon: Deboraha Sprang, MD;  Location: [facility];  Service: Cardiovascular;  Laterality: N/A;  . ESOPHAGOGASTRODUODENOSCOPY (EGD) WITH ESOPHAGEAL DILATION  X 3  . INGUINAL HERNIA REPAIR Left 1980's  . INSERT / REPLACE / REMOVE PACEMAKER  12/19/2014  . KNEE ARTHROSCOPY Right ~ 1982; ~ 1992  . MOHS SURGERY Right ~ 2014   "pre-melanoma scapula"  . POSTERIOR TIBIAL TENDON REPAIR Left 2012  . TONSILLECTOMY  1946  . TOTAL KNEE ARTHROPLASTY Right 05/20/2015   Procedure: RIGHT TOTAL KNEE ARTHROPLASTY;  Surgeon: Paralee Cancel, MD;  Location: [facility];  Service: Orthopedics;  Laterality: Right;  . UPPER GASTROINTESTINAL ENDOSCOPY      Current Outpatient Prescriptions  Medication Sig Dispense Refill  . acetaminophen (TYLENOL) 325 MG tablet Take 2 tablets (650 mg total) by mouth every 6 (six) hours as needed for mild pain or moderate pain.    Marland Kitchen  amLODipine (NORVASC) 2.5 MG tablet Take 2.5 mg by mouth daily.    Marland Kitchen aspirin 81 MG tablet Take 81 mg by mouth daily.    . benzonatate (TESSALON) 200 MG capsule Take 1 capsule (200 mg total) by mouth 3 (three) times daily as needed for cough. 45 capsule 2  . celecoxib (CELEBREX) 200 MG capsule Take 1 capsule (200 mg total) by mouth every 12 (twelve) hours. (Patient taking differently: Take 200 mg by mouth every 12 (twelve) hours. Takes as needed.) 60 capsule 0  . chlorthalidone (HYGROTON) 25 MG tablet Take 25 mg by mouth every other day.     . clopidogrel (PLAVIX) 75 MG tablet Take 75 mg by mouth every other  day.    . doxazosin (CARDURA) 8 MG tablet Take 4 mg by mouth daily.     Marland Kitchen esomeprazole (NEXIUM) 20 MG packet Take 20 mg by mouth daily before breakfast.    . Misc Natural Products (GLUCOSAMINE CHONDROITIN ADV PO) Take 1,500 mg by mouth daily.    . mometasone (NASONEX) 50 MCG/ACT nasal spray Place 2 sprays into the nose daily as needed.    . montelukast (SINGULAIR) 10 MG tablet Take 10 mg by mouth every morning.     . Multiple Vitamin (MULTIVITAMIN) capsule Take 1 capsule by mouth daily.    . simvastatin (ZOCOR) 40 MG tablet Take 40 mg by mouth daily.    . vitamin C (ASCORBIC ACID) 500 MG tablet Take 500 mg by mouth daily.      Current Facility-Administered Medications  Medication Dose Route Frequency Provider Last Rate Last Dose  . 0.9 %  sodium chloride infusion  500 mL Intravenous Continuous Irene Shipper, MD        Allergies as of 02/11/2017  . (No Known Allergies)    Family History  Problem Relation Age of Onset  . Colon cancer Mother   . Heart disease Mother   . Alzheimer's disease Mother   . Colon cancer Father   . Heart disease Father   . Prostate cancer Father   . Prostate cancer Brother   . Hemochromatosis Brother     Social History   Social History  . Marital status: Married    Spouse name: N/A  . Number of children: N/A  . Years of education: N/A   Occupational History  . retired - Doctor, general practice    Social History Main Topics  . Smoking status: Former Smoker    Years: 3.00    Types: Cigarettes    Quit date: 05/16/1962  . Smokeless tobacco: Never Used     Comment: "quit smoking in the 1960's"  . Alcohol use No     Comment: 12/19/2014 "stopped drinking in ~ 2012"  . Drug use: No  . Sexual activity: Yes   Other Topics Concern  . Not on file   Social History Narrative  . No narrative on file    Review of Systems:    Constitutional: No weight loss, fever or chills Cardiovascular: No chest pain   Respiratory: No SOB  Gastrointestinal: See HPI  and otherwise negative   Physical Exam:  Vital signs: BP 100/64 (BP Location: Left Arm, Patient Position: Sitting, Cuff Size: Normal)   Pulse 72 Comment: irregular  Ht 5' 8.25" (1.734 m) Comment: height measured without shoes  Wt 165 lb 8 oz (75.1 kg)   BMI 24.98 kg/m   Constitutional:   Pleasant Caucasian male appears to be in NAD, Well developed, Well nourished, alert and  cooperative Respiratory: Respirations even and unlabored. Lungs clear to auscultation bilaterally.   No wheezes, crackles, or rhonchi.  Cardiovascular: Normal S1, S2. No MRG. Regular rate and rhythm. No peripheral edema, cyanosis or pallor.  Gastrointestinal:  Soft, nondistended, nontender. No rebound or guarding. Normal bowel sounds. No appreciable masses or hepatomegaly. Psychiatric:  Demonstrates good judgement and reason without abnormal affect or behaviors.  RELEVANT LABS AND IMAGING: CBC    Component Value Date/Time   WBC 10.4 05/21/2015 0444   RBC 3.90 (L) 05/21/2015 0444   HGB 11.3 (L) 05/21/2015 0444   HCT 34.1 (L) 05/21/2015 0444   PLT 139 (L) 05/21/2015 0444   MCV 87.4 05/21/2015 0444   MCH 29.0 05/21/2015 0444   MCHC 33.1 05/21/2015 0444   RDW 14.6 05/21/2015 0444    CMP     Component Value Date/Time   NA 136 05/21/2015 0444   K 3.9 05/21/2015 0444   CL 103 05/21/2015 0444   CO2 26 05/21/2015 0444   GLUCOSE 151 (H) 05/21/2015 0444   BUN 15 05/21/2015 0444   CREATININE 0.91 05/21/2015 0444   CALCIUM 9.4 05/21/2015 0444   GFRNONAA >60 05/21/2015 0444   GFRAA >60 05/21/2015 0444    Assessment: 1. Epigastric pain: Patient describes developing a low appetite and epigastric abdominal pain on Tuesday 02/09/17 after a day of decreased appetite and low energy, he then had reflux and continue with low appetite the next day, he said 12-14 hours last night and feels normal this morning, eating normal breakfast; discuss to like this represents a virus, symptoms seem better at this time 2. GERD:  Chronic for the patient, typically controlled on these omeprazole 20 mg daily, increased with above 3. Decrease in appetite: With above 4. Nausea: With above  Plan: 1. Continue Nexium 40 mg daily for another 2 weeks, then decrease back to 20 mg daily 2. If you continue with symptoms over the next 3-4 weeks or have increase/worsening please let us know and we can arrange for an EGD for further evaluation. This would be scheduled in the [facility] with Dr. Henrene Pastor after the patient came off of his anticoagulant. 3. Reviewed antireflux diet and lifestyle modifications 4. Patient to follow in clinic as needed in the future  Ellouise Newer, [facility] 02/11/2017, 2:30 PM  Cc: [name], MD

## 2017-02-16 ENCOUNTER — Other Ambulatory Visit: Payer: Self-pay

## 2017-02-16 DIAGNOSIS — R109 Unspecified abdominal pain: Secondary | ICD-10-CM

## 2017-02-16 NOTE — Progress Notes (Signed)
Spoke with pt and he states he is feeling fine now, all symptoms are gone. He would like to hold off on the [place] at this time, he will call us back if he needs to. Dr. Henrene Pastor aware.

## 2017-03-02 ENCOUNTER — Ambulatory Visit (INDEPENDENT_AMBULATORY_CARE_PROVIDER_SITE_OTHER): Payer: Medicare Other | Admitting: *Deleted

## 2017-03-02 DIAGNOSIS — I441 Atrioventricular block, second degree: Secondary | ICD-10-CM | POA: Diagnosis not present

## 2017-03-02 NOTE — Progress Notes (Signed)
Remote pacemaker transmission.   

## 2017-03-04 LAB — CUP PACEART REMOTE DEVICE CHECK
Battery Remaining Longevity: 79 mo
Battery Voltage: 3.01 V
Brady Statistic AP VP Percent: 57.9 %
Brady Statistic AP VS Percent: 0.15 %
Brady Statistic AS VP Percent: 40.75 %
Brady Statistic AS VS Percent: 1.21 %
Brady Statistic RA Percent Paced: 57.27 %
Brady Statistic RV Percent Paced: 97.82 %
Date Time Interrogation Session: 20180731125728
Implantable Lead Implant Date: 20160518
Implantable Lead Implant Date: 20160518
Implantable Lead Location: 753859
Implantable Lead Location: 753860
Implantable Lead Model: 5076
Implantable Lead Model: 5076
Implantable Pulse Generator Implant Date: 20160518
Lead Channel Impedance Value: 361 Ohm
Lead Channel Impedance Value: 456 Ohm
Lead Channel Impedance Value: 513 Ohm
Lead Channel Impedance Value: 532 Ohm
Lead Channel Pacing Threshold Amplitude: 0.625 V
Lead Channel Pacing Threshold Amplitude: 0.75 V
Lead Channel Pacing Threshold Pulse Width: 0.4 ms
Lead Channel Pacing Threshold Pulse Width: 0.4 ms
Lead Channel Sensing Intrinsic Amplitude: 3.625 mV
Lead Channel Sensing Intrinsic Amplitude: 3.625 mV
Lead Channel Sensing Intrinsic Amplitude: 6.875 mV
Lead Channel Sensing Intrinsic Amplitude: 6.875 mV
Lead Channel Setting Pacing Amplitude: 1.5 V
Lead Channel Setting Pacing Amplitude: 2.5 V
Lead Channel Setting Pacing Pulse Width: 0.4 ms
Lead Channel Setting Sensing Sensitivity: 4 mV

## 2017-03-05 ENCOUNTER — Encounter: Payer: Self-pay | Admitting: Cardiology

## 2017-03-12 ENCOUNTER — Encounter: Payer: Self-pay | Admitting: Internal Medicine

## 2017-03-12 ENCOUNTER — Ambulatory Visit (INDEPENDENT_AMBULATORY_CARE_PROVIDER_SITE_OTHER): Payer: Medicare Other | Admitting: Internal Medicine

## 2017-03-12 ENCOUNTER — Ambulatory Visit
Admission: RE | Admit: 2017-03-12 | Discharge: 2017-03-12 | Disposition: A | Discharge status: Home / Self Care | Payer: Medicare Other | Source: Ambulatory Visit | Attending: Orthopedic Surgery | Admitting: Orthopedic Surgery

## 2017-03-12 ENCOUNTER — Other Ambulatory Visit: Payer: Self-pay | Admitting: Orthopedic Surgery

## 2017-03-12 VITALS — BP 120/82 | HR 62 | Ht 69.0 in | Wt 166.1 lb

## 2017-03-12 DIAGNOSIS — S22080A Wedge compression fracture of T11-T12 vertebra, initial encounter for closed fracture: Secondary | ICD-10-CM | POA: Diagnosis not present

## 2017-03-12 DIAGNOSIS — R001 Bradycardia, unspecified: Secondary | ICD-10-CM

## 2017-03-12 DIAGNOSIS — I441 Atrioventricular block, second degree: Secondary | ICD-10-CM | POA: Diagnosis not present

## 2017-03-12 DIAGNOSIS — M549 Dorsalgia, unspecified: Secondary | ICD-10-CM

## 2017-03-12 DIAGNOSIS — Z950 Presence of cardiac pacemaker: Secondary | ICD-10-CM | POA: Diagnosis not present

## 2017-03-12 DIAGNOSIS — M6701 Short Achilles tendon (acquired), right ankle: Secondary | ICD-10-CM | POA: Diagnosis not present

## 2017-03-12 DIAGNOSIS — M76821 Posterior tibial tendinitis, right leg: Secondary | ICD-10-CM | POA: Diagnosis not present

## 2017-03-12 IMAGING — CT CT T SPINE W/O CM
3 series · 12 of 33 positions shown, 14 images · non-contrast
Comparison: Chest x-ray dated [DATE] and chest x-ray dated
[DATE]

CLINICAL DATA: Back pain. Compression fracture of T11 for 8 months.

EXAM:
CT THORACIC SPINE WITHOUT CONTRAST
TECHNIQUE: Multidetector CT images of the thoracic were obtained using the
standard protocol without intravenous contrast.

[Series 2: t spine soft · axial · 0.37mm/px · z∈[-233,-29]mm · 4 of 100 slices shown, 5 images]
[im 16/100  soft-tissue]
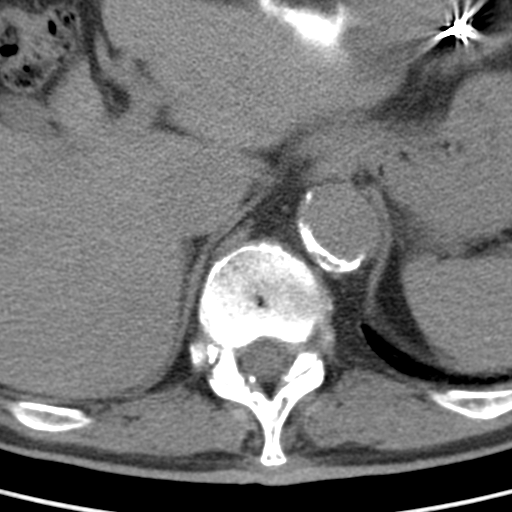
[im 16/100  bone]
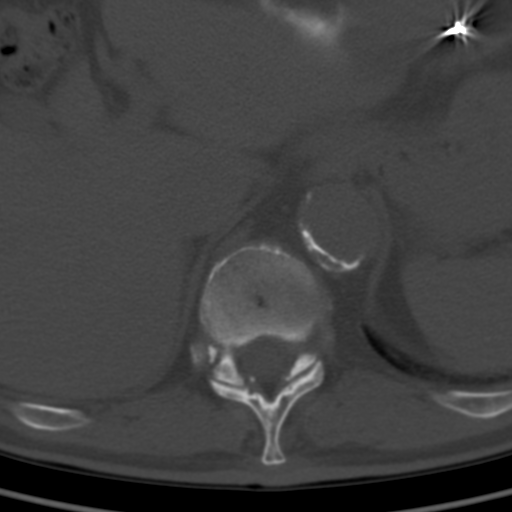
[im 39/100  bone]
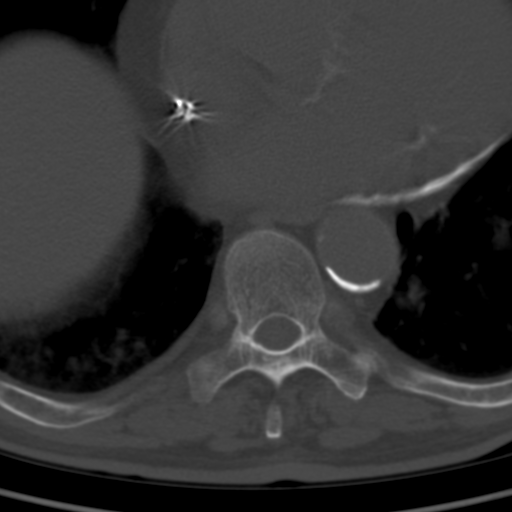
[im 61/100  bone]
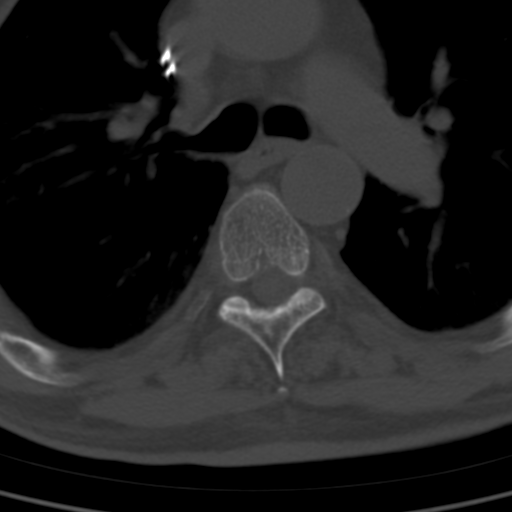
[im 84/100  bone]
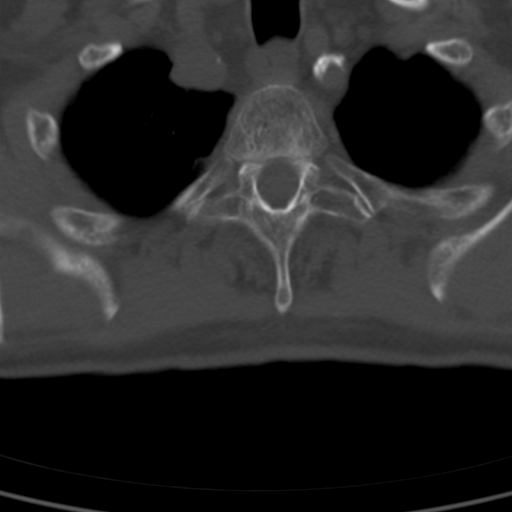

[Series 6: cor · coronal · 0.37mm/px · 3 of 58 slices shown]
[im 12/58  bone]
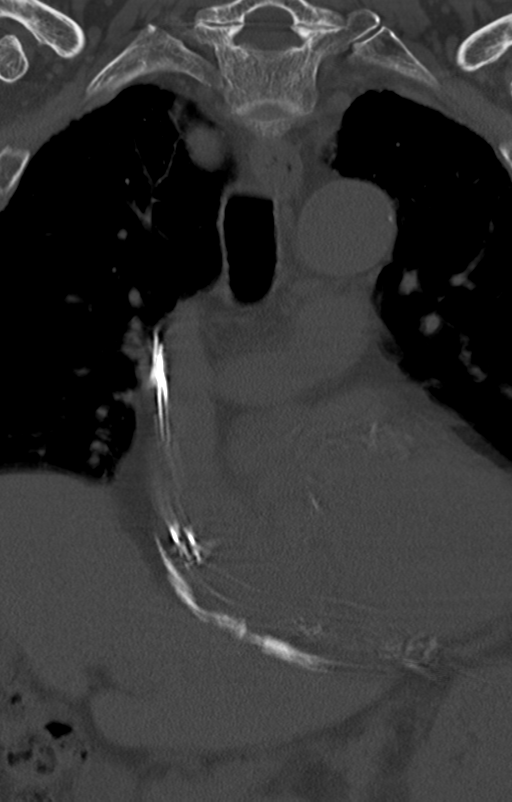
[im 23/58  bone]
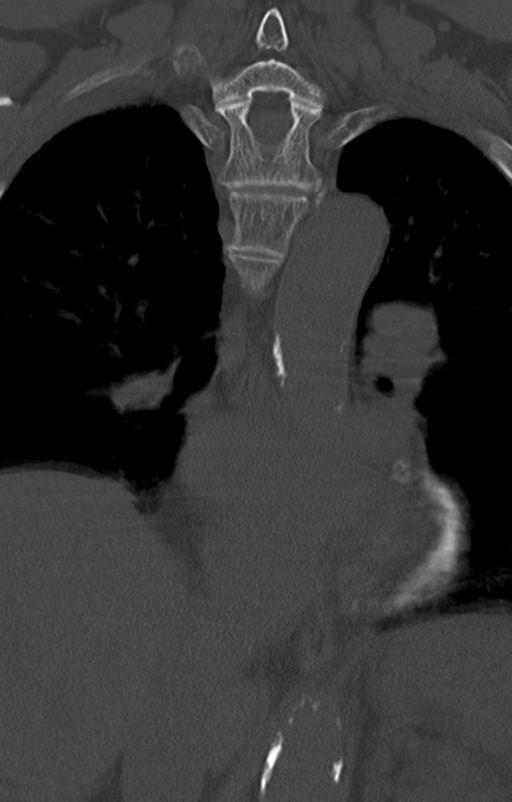
[im 35/58  bone]
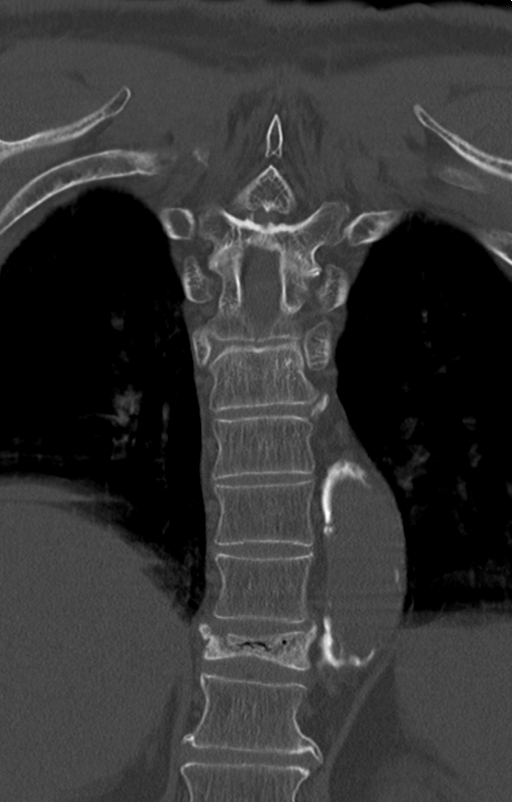

[Series 7: sag · sagittal · 0.35mm/px · 5 of 61 slices shown, 6 images]
[im 21/61  bone]
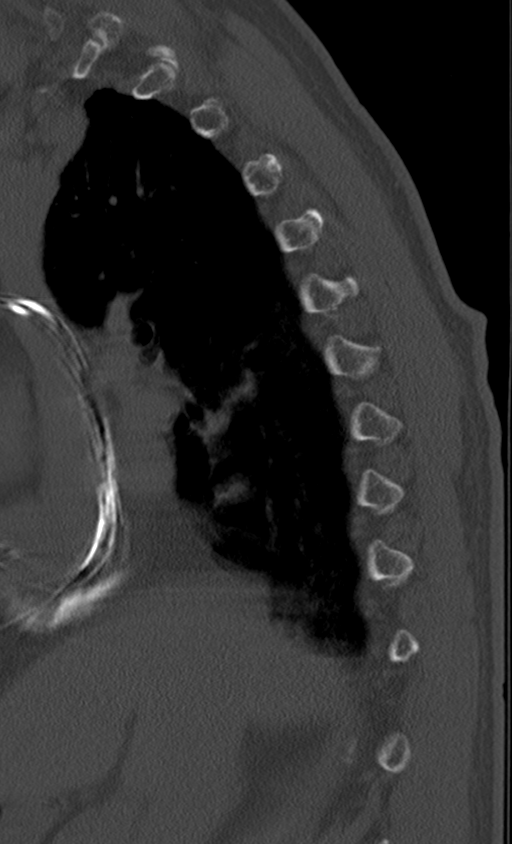
[im 26/61  bone]
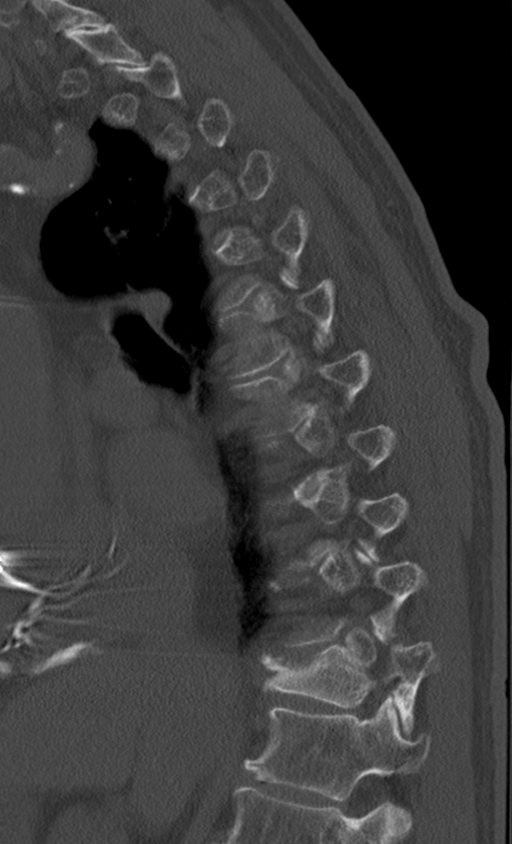
[im 31/61  soft-tissue]
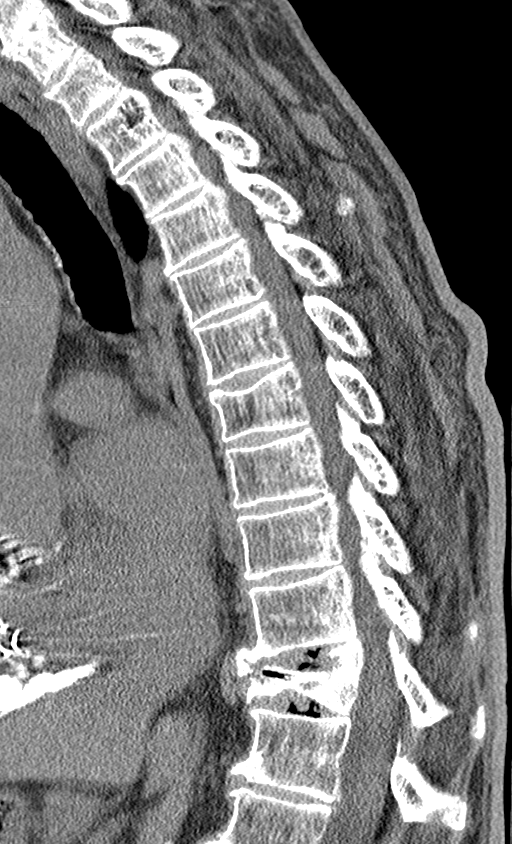
[im 31/61  bone]
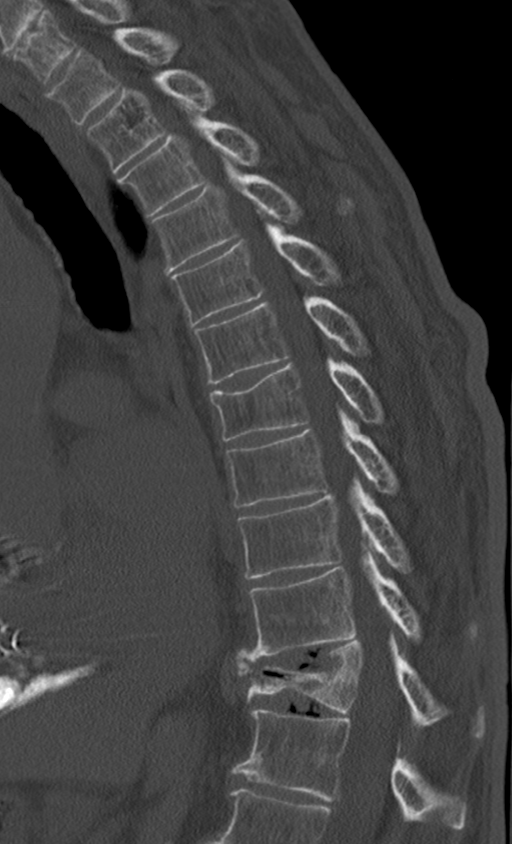
[im 36/61  bone]
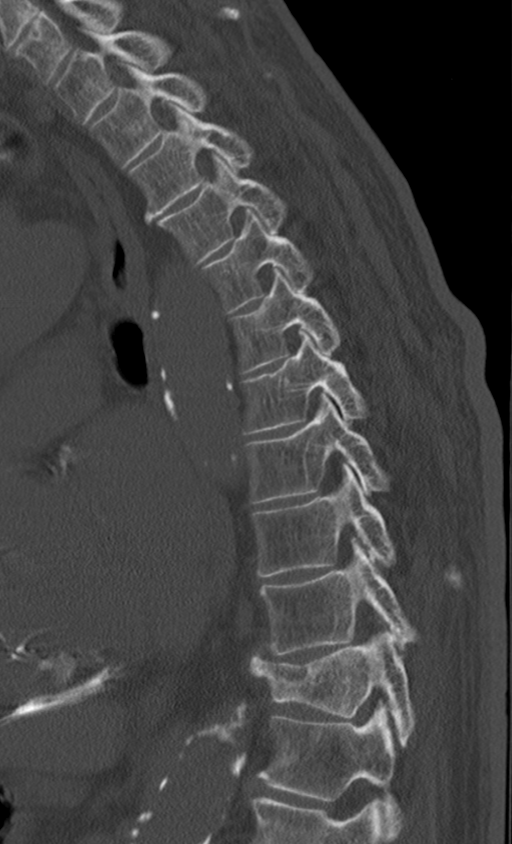
[im 41/61  bone]
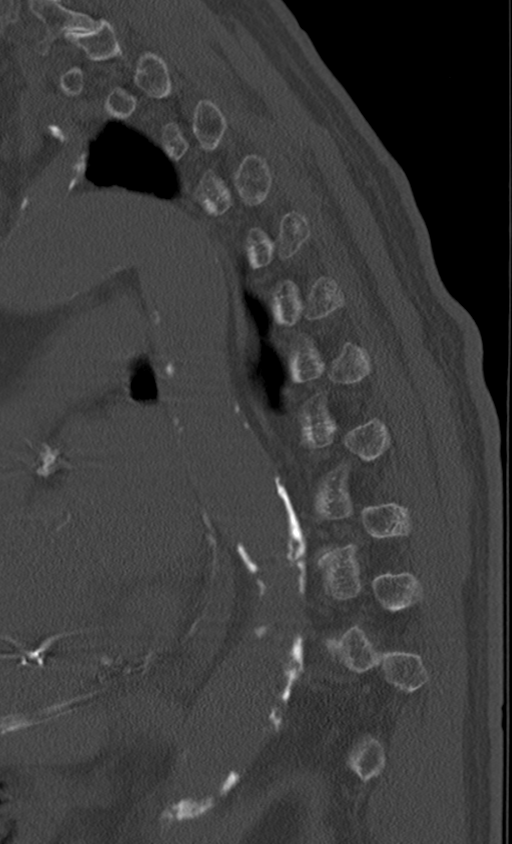

[12 of 33 positions shown; findings below may reference images not displayed]

FINDINGS: Alignment: Normal.

Vertebrae: Old mild compression deformity of the superior endplate
of T7, unchanged since [DATE]. Old compression fracture of T11,
moderately severe. No protrusion of bone or disc material into the
spinal canal at T11. Degenerative disc disease at T10-11 and T11-12
with vacuum phenomena.

Benign hemangioma in the T2 vertebra extending into the right
pedicle. This is not felt to be significant.

Paraspinal and other soft tissues: Aortic atherosclerosis. Extensive
calcification of the pericardium and coronary arteries.

Disc levels: There are no disc protrusions or significant disc
bulges. No spinal stenosis. Foraminal narrowing bilaterally at T8-9
and T9-10 and T10-11 which appears to be congenital.
IMPRESSION: 1. Old compression fracture of T11, moderately severe with secondary
degenerative disc disease at T10-11 and T11-12. No disc protrusion
or neural impingement.
2. Old mild compression deformity of T7, unchanged since [DATE]. No acute abnormalities.

## 2017-03-12 NOTE — Progress Notes (Signed)
Patient Care Team: [name], MD as PCP - General (Internal Medicine)   HPI  Alexander Murray is a 76 y.o. male Seen in follow-up for symptomatic high-grade heart block. He underwent pacing.  He was seen a couple of weeks ago with first-degree AV block and some episodes of lightheadedness. It was not clear how these might be related. He is moving around as a left bundle  He is vastly improved following device implantation. There is no further dizziness. Exercise tolerance is largely resolved.  He has a history of prior retinal artery occlusion and no source was ever identified. He is on aspirin and Plavix which he takes every other day.. The patient denies chest pain, shortness of breath, nocturnal dyspnea, orthopnea or peripheral edema.  There have been no palpitations, lightheadedness or syncope.    myoview 2016 Low risk stress nuclear study with small, mild, fixed anteroseptal and inferior defects (possible soft tissue attenuation); no ischemia; note patient in type 1 second degree AV block on baseline ECG; HR increased to 127 with exercise; Mobitz 1, 2:1 AV block and occasional junctional beats in recovery; patient seen by Dr Harrington Challenger prior to leaving the office.  Past Medical History:  Diagnosis Date  . Arthritis    "right knee and right thumb" (12/19/2014)  . Asthma   . Bronchitis 07/2016   started in December and continued about 2 months  . Esophageal stricture   . GERD (gastroesophageal reflux disease)   . Hemochromatosis   . Hypercalcemia   . Hypercholesteremia   . Hypertension   . Osteoarthritis   . Palpitations   . Presence of permanent cardiac pacemaker    hx bradycardia  . Retinal artery occlusion, branch    "right eye"  . Second degree AV block, Mobitz type II   . Skin cancer    right shoulder  . Stroke [facility]) ~ 2011   "right eye"/ partial blindness    Past Surgical History:  Procedure Laterality Date  . CATARACT EXTRACTION, BILATERAL  01/2017  .  COLONOSCOPY    . EP IMPLANTABLE DEVICE N/A 12/19/2014   Procedure: Pacemaker Implant;  Surgeon: Deboraha Sprang, MD;  Location: [place] [facility];  Service: Cardiovascular;  Laterality: N/A;  . ESOPHAGOGASTRODUODENOSCOPY (EGD) WITH ESOPHAGEAL DILATION  X 3  . INGUINAL HERNIA REPAIR Left 1980's  . INSERT / REPLACE / REMOVE PACEMAKER  12/19/2014  . KNEE ARTHROSCOPY Right ~ 1982; ~ 1992  . MOHS SURGERY Right ~ 2014   "pre-melanoma scapula"  . POSTERIOR TIBIAL TENDON REPAIR Left 2012  . TONSILLECTOMY  1946  . TOTAL KNEE ARTHROPLASTY Right 05/20/2015   Procedure: RIGHT TOTAL KNEE ARTHROPLASTY;  Surgeon: Paralee Cancel, MD;  Location: [facility];  Service: Orthopedics;  Laterality: Right;  . UPPER GASTROINTESTINAL ENDOSCOPY      Current Outpatient Prescriptions  Medication Sig Dispense Refill  . acetaminophen (TYLENOL) 325 MG tablet Take 2 tablets (650 mg total) by mouth every 6 (six) hours as needed for mild pain or moderate pain.    Marland Kitchen amLODipine (NORVASC) 2.5 MG tablet Take 2.5 mg by mouth daily.    Marland Kitchen aspirin 81 MG tablet Take 81 mg by mouth daily.    . benzonatate (TESSALON) 200 MG capsule Take 1 capsule (200 mg total) by mouth 3 (three) times daily as needed for cough. 45 capsule 2  . celecoxib (CELEBREX) 200 MG capsule Take 1 capsule (200 mg total) by mouth every 12 (twelve) hours. (Patient taking differently: Take 200  mg by mouth every 12 (twelve) hours. Takes as needed.) 60 capsule 0  . chlorthalidone (HYGROTON) 25 MG tablet Take 25 mg by mouth every other day.     . clopidogrel (PLAVIX) 75 MG tablet Take 75 mg by mouth every other day.    . doxazosin (CARDURA) 8 MG tablet Take 4 mg by mouth daily.     Marland Kitchen esomeprazole (NEXIUM) 20 MG packet Take 20 mg by mouth daily before breakfast.    . Misc Natural Products (GLUCOSAMINE CHONDROITIN ADV PO) Take 1,500 mg by mouth daily.    . mometasone (NASONEX) 50 MCG/ACT nasal spray Place 2 sprays into the nose daily as needed.    . montelukast (SINGULAIR) 10  MG tablet Take 10 mg by mouth every morning.     . Multiple Vitamin (MULTIVITAMIN) capsule Take 1 capsule by mouth daily.    . simvastatin (ZOCOR) 40 MG tablet Take 40 mg by mouth daily.    . vitamin C (ASCORBIC ACID) 500 MG tablet Take 500 mg by mouth daily.      Current Facility-Administered Medications  Medication Dose Route Frequency Provider Last Rate Last Dose  . 0.9 %  sodium chloride infusion  500 mL Intravenous Continuous Irene Shipper, MD        No Known Allergies  Review of Systems negative except from HPI and PMH  Physical Exam   BP 120/82   Pulse 62   Ht 5\' 9"  (1.753 m)   Wt 166 lb 2 oz (75.4 kg)   SpO2 98%   BMI 24.53 kg/m  Well developed and nourished in no acute distress HENT normal Neck supple with JVP-flat Carotids brisk and full without bruits Clear Regular rate and rhythm, no murmurs or gallops Abd-soft with active BS no pulsation No Clubbing cyanosis edema Skin-warm and dry A & Oriented  Grossly normal sensory and motor function   ECG  sinus at 68 with P-synchronous/ AV  pacing   Assessment and  Plan  Sinus bradycardia  Hypertension   Prior stroke  High-grade 2:1 and 3:2 heart block  Pacemaker-Medtronic   The patient's device was interrogated.  The information was reviewed. device was reprogrammed to maximize longevity   BP well controlled  May be able to reduce BP meds, will have him take his doxascin at night   No Afib

## 2017-03-12 NOTE — Patient Instructions (Addendum)
Medication Instructions: - Your physician has recommended you make the following change in your medication:  1) Take amlodipine 2.5 mg once daily at bedtime   Labwork: - none ordered  Procedures/Testing: - none ordered  Follow-Up: - Remote monitoring is used to monitor your Pacemaker of ICD from home. This monitoring reduces the number of office visits required to check your device to one time per year. It allows [place] to keep an eye on the functioning of your device to ensure it is working properly. You are scheduled for a device check from home on 06/01/17. You may send your transmission at any time that day. If you have a wireless device, the transmission will be sent automatically. After your physician reviews your transmission, you will receive a postcard with your next transmission date.  - Your physician wants you to follow-up in: 1 year with Dr. Caryl Comes.  You will receive a reminder letter in the mail two months in advance. If you don't receive a letter, please call our office to schedule the follow-up appointment.   Any Additional Special Instructions Will Be Listed Below (If Applicable).     If you need a refill on your cardiac medications before your next appointment, please call your pharmacy.

## 2017-03-16 LAB — CUP PACEART INCLINIC DEVICE CHECK
Battery Remaining Longevity: 79 mo
Battery Voltage: 3.01 V
Brady Statistic AP VP Percent: 63.5 %
Brady Statistic AP VS Percent: 0.08 %
Brady Statistic AS VP Percent: 35.9 %
Brady Statistic AS VS Percent: 0.53 %
Brady Statistic RA Percent Paced: 63.2 %
Brady Statistic RV Percent Paced: 99.08 %
Date Time Interrogation Session: 20180810165105
Implantable Lead Implant Date: 20160518
Implantable Lead Implant Date: 20160518
Implantable Lead Location: 753859
Implantable Lead Location: 753860
Implantable Lead Model: 5076
Implantable Lead Model: 5076
Implantable Pulse Generator Implant Date: 20160518
Lead Channel Impedance Value: 361 Ohm
Lead Channel Impedance Value: 475 Ohm
Lead Channel Impedance Value: 513 Ohm
Lead Channel Impedance Value: 551 Ohm
Lead Channel Pacing Threshold Amplitude: 0.625 V
Lead Channel Pacing Threshold Amplitude: 0.75 V
Lead Channel Pacing Threshold Pulse Width: 0.4 ms
Lead Channel Pacing Threshold Pulse Width: 0.4 ms
Lead Channel Sensing Intrinsic Amplitude: 18.25 mV
Lead Channel Sensing Intrinsic Amplitude: 3.25 mV
Lead Channel Setting Pacing Amplitude: 1.75 V
Lead Channel Setting Pacing Amplitude: 2.5 V
Lead Channel Setting Pacing Pulse Width: 0.4 ms
Lead Channel Setting Sensing Sensitivity: 4 mV

## 2017-05-01 DIAGNOSIS — Z23 Encounter for immunization: Secondary | ICD-10-CM | POA: Diagnosis not present

## 2017-05-27 DIAGNOSIS — Z85828 Personal history of other malignant neoplasm of skin: Secondary | ICD-10-CM | POA: Diagnosis not present

## 2017-05-27 DIAGNOSIS — Z85820 Personal history of malignant melanoma of skin: Secondary | ICD-10-CM | POA: Diagnosis not present

## 2017-05-27 DIAGNOSIS — M6740 Ganglion, unspecified site: Secondary | ICD-10-CM | POA: Diagnosis not present

## 2017-05-27 DIAGNOSIS — D225 Melanocytic nevi of trunk: Secondary | ICD-10-CM | POA: Diagnosis not present

## 2017-05-27 DIAGNOSIS — L570 Actinic keratosis: Secondary | ICD-10-CM | POA: Diagnosis not present

## 2017-05-27 DIAGNOSIS — D1801 Hemangioma of skin and subcutaneous tissue: Secondary | ICD-10-CM | POA: Diagnosis not present

## 2017-05-27 DIAGNOSIS — L820 Inflamed seborrheic keratosis: Secondary | ICD-10-CM | POA: Diagnosis not present

## 2017-05-27 DIAGNOSIS — L821 Other seborrheic keratosis: Secondary | ICD-10-CM | POA: Diagnosis not present

## 2017-06-01 ENCOUNTER — Ambulatory Visit (INDEPENDENT_AMBULATORY_CARE_PROVIDER_SITE_OTHER): Payer: Medicare Other | Admitting: *Deleted

## 2017-06-01 DIAGNOSIS — I441 Atrioventricular block, second degree: Secondary | ICD-10-CM | POA: Diagnosis not present

## 2017-06-01 NOTE — Progress Notes (Signed)
Remote pacemaker transmission.   

## 2017-06-03 LAB — CUP PACEART REMOTE DEVICE CHECK
Battery Remaining Longevity: 78 mo
Battery Voltage: 3.01 V
Brady Statistic AP VP Percent: 55.26 %
Brady Statistic AP VS Percent: 0.11 %
Brady Statistic AS VP Percent: 43.29 %
Brady Statistic AS VS Percent: 1.35 %
Brady Statistic RA Percent Paced: 54.5 %
Brady Statistic RV Percent Paced: 97.64 %
Date Time Interrogation Session: 20181030182916
Implantable Lead Implant Date: 20160518
Implantable Lead Implant Date: 20160518
Implantable Lead Location: 753859
Implantable Lead Location: 753860
Implantable Lead Model: 5076
Implantable Lead Model: 5076
Implantable Pulse Generator Implant Date: 20160518
Lead Channel Impedance Value: 342 Ohm
Lead Channel Impedance Value: 456 Ohm
Lead Channel Impedance Value: 494 Ohm
Lead Channel Impedance Value: 513 Ohm
Lead Channel Pacing Threshold Amplitude: 0.625 V
Lead Channel Pacing Threshold Amplitude: 0.75 V
Lead Channel Pacing Threshold Pulse Width: 0.4 ms
Lead Channel Pacing Threshold Pulse Width: 0.4 ms
Lead Channel Sensing Intrinsic Amplitude: 3.5 mV
Lead Channel Sensing Intrinsic Amplitude: 3.5 mV
Lead Channel Sensing Intrinsic Amplitude: 6 mV
Lead Channel Sensing Intrinsic Amplitude: 6 mV
Lead Channel Setting Pacing Amplitude: 1.5 V
Lead Channel Setting Pacing Amplitude: 2.5 V
Lead Channel Setting Pacing Pulse Width: 0.4 ms
Lead Channel Setting Sensing Sensitivity: 4 mV

## 2017-06-09 ENCOUNTER — Encounter: Payer: Self-pay | Admitting: Cardiology

## 2017-06-29 ENCOUNTER — Other Ambulatory Visit: Payer: Self-pay | Admitting: Orthopedic Surgery

## 2017-06-29 DIAGNOSIS — D2112 Benign neoplasm of connective and other soft tissue of left upper limb, including shoulder: Secondary | ICD-10-CM | POA: Diagnosis not present

## 2017-06-29 DIAGNOSIS — D481 Neoplasm of uncertain behavior of connective and other soft tissue: Secondary | ICD-10-CM | POA: Diagnosis not present

## 2017-07-06 DIAGNOSIS — R2231 Localized swelling, mass and lump, right upper limb: Secondary | ICD-10-CM | POA: Diagnosis not present

## 2017-07-06 DIAGNOSIS — M67442 Ganglion, left hand: Secondary | ICD-10-CM | POA: Diagnosis not present

## 2017-07-13 DIAGNOSIS — R2231 Localized swelling, mass and lump, right upper limb: Secondary | ICD-10-CM | POA: Diagnosis not present

## 2017-07-13 DIAGNOSIS — M67442 Ganglion, left hand: Secondary | ICD-10-CM | POA: Diagnosis not present

## 2017-08-31 ENCOUNTER — Ambulatory Visit (INDEPENDENT_AMBULATORY_CARE_PROVIDER_SITE_OTHER): Payer: Medicare Other | Admitting: *Deleted

## 2017-08-31 DIAGNOSIS — I441 Atrioventricular block, second degree: Secondary | ICD-10-CM

## 2017-08-31 NOTE — Progress Notes (Signed)
Remote pacemaker transmission.   

## 2017-09-02 ENCOUNTER — Encounter: Payer: Self-pay | Admitting: Cardiology

## 2017-09-15 LAB — CUP PACEART REMOTE DEVICE CHECK
Battery Remaining Longevity: 77 mo
Battery Voltage: 3.01 V
Brady Statistic AP VP Percent: 60.87 %
Brady Statistic AP VS Percent: 0.12 %
Brady Statistic AS VP Percent: 37.41 %
Brady Statistic AS VS Percent: 1.6 %
Brady Statistic RA Percent Paced: 60.01 %
Brady Statistic RV Percent Paced: 97.53 %
Date Time Interrogation Session: 20190129172227
Implantable Lead Implant Date: 20160518
Implantable Lead Implant Date: 20160518
Implantable Lead Location: 753859
Implantable Lead Location: 753860
Implantable Lead Model: 5076
Implantable Lead Model: 5076
Implantable Pulse Generator Implant Date: 20160518
Lead Channel Impedance Value: 342 Ohm
Lead Channel Impedance Value: 437 Ohm
Lead Channel Impedance Value: 494 Ohm
Lead Channel Impedance Value: 494 Ohm
Lead Channel Pacing Threshold Amplitude: 0.625 V
Lead Channel Pacing Threshold Amplitude: 0.75 V
Lead Channel Pacing Threshold Pulse Width: 0.4 ms
Lead Channel Pacing Threshold Pulse Width: 0.4 ms
Lead Channel Sensing Intrinsic Amplitude: 3.625 mV
Lead Channel Sensing Intrinsic Amplitude: 3.625 mV
Lead Channel Sensing Intrinsic Amplitude: 9 mV
Lead Channel Sensing Intrinsic Amplitude: 9 mV
Lead Channel Setting Pacing Amplitude: 1.5 V
Lead Channel Setting Pacing Amplitude: 2.5 V
Lead Channel Setting Pacing Pulse Width: 0.4 ms
Lead Channel Setting Sensing Sensitivity: 4 mV

## 2017-10-13 DIAGNOSIS — E782 Mixed hyperlipidemia: Secondary | ICD-10-CM | POA: Diagnosis not present

## 2017-10-13 DIAGNOSIS — I10 Essential (primary) hypertension: Secondary | ICD-10-CM | POA: Diagnosis not present

## 2017-10-13 DIAGNOSIS — Z125 Encounter for screening for malignant neoplasm of prostate: Secondary | ICD-10-CM | POA: Diagnosis not present

## 2017-10-21 DIAGNOSIS — Z1212 Encounter for screening for malignant neoplasm of rectum: Secondary | ICD-10-CM | POA: Diagnosis not present

## 2017-10-25 DIAGNOSIS — E782 Mixed hyperlipidemia: Secondary | ICD-10-CM | POA: Diagnosis not present

## 2017-10-25 DIAGNOSIS — Z1389 Encounter for screening for other disorder: Secondary | ICD-10-CM | POA: Diagnosis not present

## 2017-10-25 DIAGNOSIS — Z6824 Body mass index (BMI) 24.0-24.9, adult: Secondary | ICD-10-CM | POA: Diagnosis not present

## 2017-10-25 DIAGNOSIS — R05 Cough: Secondary | ICD-10-CM | POA: Diagnosis not present

## 2017-10-25 DIAGNOSIS — M1990 Unspecified osteoarthritis, unspecified site: Secondary | ICD-10-CM | POA: Diagnosis not present

## 2017-10-25 DIAGNOSIS — Z0000 Encounter for general adult medical examination without abnormal findings: Secondary | ICD-10-CM | POA: Diagnosis not present

## 2017-10-25 DIAGNOSIS — I10 Essential (primary) hypertension: Secondary | ICD-10-CM | POA: Diagnosis not present

## 2017-10-25 DIAGNOSIS — R2681 Unsteadiness on feet: Secondary | ICD-10-CM | POA: Diagnosis not present

## 2017-11-29 DIAGNOSIS — L820 Inflamed seborrheic keratosis: Secondary | ICD-10-CM | POA: Diagnosis not present

## 2017-11-29 DIAGNOSIS — D225 Melanocytic nevi of trunk: Secondary | ICD-10-CM | POA: Diagnosis not present

## 2017-11-29 DIAGNOSIS — L570 Actinic keratosis: Secondary | ICD-10-CM | POA: Diagnosis not present

## 2017-11-29 DIAGNOSIS — Z85820 Personal history of malignant melanoma of skin: Secondary | ICD-10-CM | POA: Diagnosis not present

## 2017-11-29 DIAGNOSIS — L812 Freckles: Secondary | ICD-10-CM | POA: Diagnosis not present

## 2017-11-29 DIAGNOSIS — D1801 Hemangioma of skin and subcutaneous tissue: Secondary | ICD-10-CM | POA: Diagnosis not present

## 2017-11-29 DIAGNOSIS — Z85828 Personal history of other malignant neoplasm of skin: Secondary | ICD-10-CM | POA: Diagnosis not present

## 2017-11-29 DIAGNOSIS — L821 Other seborrheic keratosis: Secondary | ICD-10-CM | POA: Diagnosis not present

## 2017-11-30 ENCOUNTER — Ambulatory Visit (INDEPENDENT_AMBULATORY_CARE_PROVIDER_SITE_OTHER): Payer: Medicare Other | Admitting: *Deleted

## 2017-11-30 DIAGNOSIS — I441 Atrioventricular block, second degree: Secondary | ICD-10-CM

## 2017-11-30 NOTE — Progress Notes (Signed)
Remote pacemaker transmission.   

## 2017-12-01 ENCOUNTER — Encounter: Payer: Self-pay | Admitting: Cardiology

## 2017-12-01 DIAGNOSIS — H3411 Central retinal artery occlusion, right eye: Secondary | ICD-10-CM | POA: Diagnosis not present

## 2017-12-01 DIAGNOSIS — H02102 Unspecified ectropion of right lower eyelid: Secondary | ICD-10-CM | POA: Diagnosis not present

## 2017-12-01 DIAGNOSIS — H524 Presbyopia: Secondary | ICD-10-CM | POA: Diagnosis not present

## 2017-12-01 DIAGNOSIS — H353132 Nonexudative age-related macular degeneration, bilateral, intermediate dry stage: Secondary | ICD-10-CM | POA: Diagnosis not present

## 2017-12-01 LAB — CUP PACEART REMOTE DEVICE CHECK
Battery Remaining Longevity: 72 mo
Battery Voltage: 3 V
Brady Statistic AP VP Percent: 63.35 %
Brady Statistic AP VS Percent: 0.13 %
Brady Statistic AS VP Percent: 35.89 %
Brady Statistic AS VS Percent: 0.64 %
Brady Statistic RA Percent Paced: 62.65 %
Brady Statistic RV Percent Paced: 98.46 %
Date Time Interrogation Session: 20190430114843
Implantable Lead Implant Date: 20160518
Implantable Lead Implant Date: 20160518
Implantable Lead Location: 753859
Implantable Lead Location: 753860
Implantable Lead Model: 5076
Implantable Lead Model: 5076
Implantable Pulse Generator Implant Date: 20160518
Lead Channel Impedance Value: 342 Ohm
Lead Channel Impedance Value: 418 Ohm
Lead Channel Impedance Value: 494 Ohm
Lead Channel Impedance Value: 494 Ohm
Lead Channel Pacing Threshold Amplitude: 0.625 V
Lead Channel Pacing Threshold Amplitude: 0.75 V
Lead Channel Pacing Threshold Pulse Width: 0.4 ms
Lead Channel Pacing Threshold Pulse Width: 0.4 ms
Lead Channel Sensing Intrinsic Amplitude: 3.375 mV
Lead Channel Sensing Intrinsic Amplitude: 3.375 mV
Lead Channel Sensing Intrinsic Amplitude: 6.875 mV
Lead Channel Sensing Intrinsic Amplitude: 6.875 mV
Lead Channel Setting Pacing Amplitude: 1.5 V
Lead Channel Setting Pacing Amplitude: 2.5 V
Lead Channel Setting Pacing Pulse Width: 0.4 ms
Lead Channel Setting Sensing Sensitivity: 4 mV

## 2018-02-14 DIAGNOSIS — H26492 Other secondary cataract, left eye: Secondary | ICD-10-CM | POA: Diagnosis not present

## 2018-02-14 DIAGNOSIS — H353232 Exudative age-related macular degeneration, bilateral, with inactive choroidal neovascularization: Secondary | ICD-10-CM | POA: Diagnosis not present

## 2018-03-01 ENCOUNTER — Ambulatory Visit (INDEPENDENT_AMBULATORY_CARE_PROVIDER_SITE_OTHER): Payer: Medicare Other | Admitting: *Deleted

## 2018-03-01 DIAGNOSIS — I441 Atrioventricular block, second degree: Secondary | ICD-10-CM

## 2018-03-01 NOTE — Progress Notes (Signed)
Remote pacemaker transmission.   

## 2018-03-02 ENCOUNTER — Encounter: Payer: Self-pay | Admitting: Cardiology

## 2018-03-05 LAB — CUP PACEART REMOTE DEVICE CHECK
Battery Remaining Longevity: 65 mo
Battery Voltage: 3 V
Brady Statistic AP VP Percent: 56.98 %
Brady Statistic AP VS Percent: 0.17 %
Brady Statistic AS VP Percent: 40.47 %
Brady Statistic AS VS Percent: 2.38 %
Brady Statistic RA Percent Paced: 53.67 %
Brady Statistic RV Percent Paced: 92.94 %
Date Time Interrogation Session: 20190730124818
Implantable Lead Implant Date: 20160518
Implantable Lead Implant Date: 20160518
Implantable Lead Location: 753859
Implantable Lead Location: 753860
Implantable Lead Model: 5076
Implantable Lead Model: 5076
Implantable Pulse Generator Implant Date: 20160518
Lead Channel Impedance Value: 361 Ohm
Lead Channel Impedance Value: 380 Ohm
Lead Channel Impedance Value: 456 Ohm
Lead Channel Impedance Value: 494 Ohm
Lead Channel Pacing Threshold Amplitude: 0.625 V
Lead Channel Pacing Threshold Amplitude: 0.75 V
Lead Channel Pacing Threshold Pulse Width: 0.4 ms
Lead Channel Pacing Threshold Pulse Width: 0.4 ms
Lead Channel Sensing Intrinsic Amplitude: 3.375 mV
Lead Channel Sensing Intrinsic Amplitude: 3.375 mV
Lead Channel Sensing Intrinsic Amplitude: 6.125 mV
Lead Channel Sensing Intrinsic Amplitude: 6.125 mV
Lead Channel Setting Pacing Amplitude: 1.5 V
Lead Channel Setting Pacing Amplitude: 2.5 V
Lead Channel Setting Pacing Pulse Width: 0.4 ms
Lead Channel Setting Sensing Sensitivity: 4 mV

## 2018-03-22 ENCOUNTER — Encounter: Payer: Self-pay | Admitting: Internal Medicine

## 2018-04-07 DIAGNOSIS — H26492 Other secondary cataract, left eye: Secondary | ICD-10-CM | POA: Diagnosis not present

## 2018-04-14 ENCOUNTER — Ambulatory Visit (INDEPENDENT_AMBULATORY_CARE_PROVIDER_SITE_OTHER): Payer: Medicare Other | Admitting: Internal Medicine

## 2018-04-14 ENCOUNTER — Encounter: Payer: Self-pay | Admitting: Internal Medicine

## 2018-04-14 VITALS — BP 106/78 | HR 68 | Ht 70.0 in | Wt 169.8 lb

## 2018-04-14 DIAGNOSIS — Z950 Presence of cardiac pacemaker: Secondary | ICD-10-CM | POA: Diagnosis not present

## 2018-04-14 DIAGNOSIS — I441 Atrioventricular block, second degree: Secondary | ICD-10-CM | POA: Diagnosis not present

## 2018-04-14 DIAGNOSIS — I10 Essential (primary) hypertension: Secondary | ICD-10-CM | POA: Diagnosis not present

## 2018-04-14 DIAGNOSIS — R001 Bradycardia, unspecified: Secondary | ICD-10-CM

## 2018-04-14 LAB — CUP PACEART INCLINIC DEVICE CHECK
Battery Remaining Longevity: 65 mo
Battery Voltage: 3 V
Brady Statistic AP VP Percent: 59.24 %
Brady Statistic AP VS Percent: 0.14 %
Brady Statistic AS VP Percent: 39.16 %
Brady Statistic AS VS Percent: 1.46 %
Brady Statistic RA Percent Paced: 57.67 %
Brady Statistic RV Percent Paced: 96.48 %
Date Time Interrogation Session: 20190912173651
Implantable Lead Implant Date: 20160518
Implantable Lead Implant Date: 20160518
Implantable Lead Location: 753859
Implantable Lead Location: 753860
Implantable Lead Model: 5076
Implantable Lead Model: 5076
Implantable Pulse Generator Implant Date: 20160518
Lead Channel Impedance Value: 361 Ohm
Lead Channel Impedance Value: 418 Ohm
Lead Channel Impedance Value: 475 Ohm
Lead Channel Impedance Value: 494 Ohm
Lead Channel Pacing Threshold Amplitude: 0.75 V
Lead Channel Pacing Threshold Amplitude: 0.75 V
Lead Channel Pacing Threshold Pulse Width: 0.4 ms
Lead Channel Pacing Threshold Pulse Width: 0.4 ms
Lead Channel Sensing Intrinsic Amplitude: 3 mV
Lead Channel Setting Pacing Amplitude: 1.5 V
Lead Channel Setting Pacing Amplitude: 2.5 V
Lead Channel Setting Pacing Pulse Width: 0.4 ms
Lead Channel Setting Sensing Sensitivity: 4 mV

## 2018-04-14 NOTE — Progress Notes (Signed)
Patient Care Team: [name], MD as PCP - General (Internal Medicine)   HPI  Alexander Murray is a 77 y.o. male Seen in follow-up for symptomatic high-grade heart block. He underwent pacing.  He was seen a couple of weeks ago with first-degree AV block and some episodes of lightheadedness. It was not clear how these might be related. He is moving around as a left bundle  He is vastly improved following device implantation. There is no further dizziness. Exercise tolerance is largely resolved.  He has a history of prior retinal artery occlusion and no source was ever identified. He is on aspirin and Plavix which he takes every other day..  The patient denies chest pain, nocturnal dyspnea, orthopnea or peripheral edema.  There have been no   lightheadedness or syncope. Mild shortness of breath with exertion    Has awakened on one occasion with palpitations at night.   myoview 2016 Low risk stress nuclear study with small, mild, fixed anteroseptal and inferior defects (possible soft tissue attenuation); no ischemia   Past Medical History:  Diagnosis Date  . Arthritis    "right knee and right thumb" (12/19/2014)  . Asthma   . Bronchitis 07/2016   started in December and continued about 2 months  . Esophageal stricture   . GERD (gastroesophageal reflux disease)   . Hemochromatosis   . Hypercalcemia   . Hypercholesteremia   . Hypertension   . Osteoarthritis   . Palpitations   . Presence of permanent cardiac pacemaker    hx bradycardia  . Retinal artery occlusion, branch    "right eye"  . Second degree AV block, Mobitz type II   . Skin cancer    right shoulder  . Stroke [facility]) ~ 2011   "right eye"/ partial blindness    Past Surgical History:  Procedure Laterality Date  . CATARACT EXTRACTION, BILATERAL  01/2017  . COLONOSCOPY    . EP IMPLANTABLE DEVICE N/A 12/19/2014   Procedure: Pacemaker Implant;  Surgeon: Deboraha Sprang, MD;  Location: [place] [facility];   Service: Cardiovascular;  Laterality: N/A;  . ESOPHAGOGASTRODUODENOSCOPY (EGD) WITH ESOPHAGEAL DILATION  X 3  . INGUINAL HERNIA REPAIR Left 1980's  . INSERT / REPLACE / REMOVE PACEMAKER  12/19/2014  . KNEE ARTHROSCOPY Right ~ 1982; ~ 1992  . MOHS SURGERY Right ~ 2014   "pre-melanoma scapula"  . POSTERIOR TIBIAL TENDON REPAIR Left 2012  . TONSILLECTOMY  1946  . TOTAL KNEE ARTHROPLASTY Right 05/20/2015   Procedure: RIGHT TOTAL KNEE ARTHROPLASTY;  Surgeon: Paralee Cancel, MD;  Location: [facility];  Service: Orthopedics;  Laterality: Right;  . UPPER GASTROINTESTINAL ENDOSCOPY      Current Outpatient Medications  Medication Sig Dispense Refill  . acetaminophen (TYLENOL) 325 MG tablet Take 2 tablets (650 mg total) by mouth every 6 (six) hours as needed for mild pain or moderate pain.    Marland Kitchen amLODipine (NORVASC) 2.5 MG tablet Take 2.5 mg by mouth at bedtime.    Marland Kitchen aspirin 81 MG tablet Take 81 mg by mouth daily.    . benzonatate (TESSALON) 200 MG capsule Take 1 capsule (200 mg total) by mouth 3 (three) times daily as needed for cough. 45 capsule 2  . celecoxib (CELEBREX) 200 MG capsule Take 200 mg by mouth 2 (two) times daily as needed for mild pain.    . chlorthalidone (HYGROTON) 25 MG tablet Take 25 mg by mouth every other day.     . clopidogrel (PLAVIX)  75 MG tablet Take 75 mg by mouth every other day.    . doxazosin (CARDURA) 8 MG tablet Take 4 mg by mouth daily.     Marland Kitchen esomeprazole (NEXIUM) 20 MG packet Take 20 mg by mouth daily before breakfast.    . Misc Natural Products (GLUCOSAMINE CHONDROITIN ADV PO) Take 1,500 mg by mouth daily.    . mometasone (NASONEX) 50 MCG/ACT nasal spray Place 2 sprays into the nose daily as needed.    . montelukast (SINGULAIR) 10 MG tablet Take 10 mg by mouth every morning.     . Multiple Vitamin (MULTIVITAMIN) capsule Take 1 capsule by mouth daily.    . simvastatin (ZOCOR) 40 MG tablet Take 40 mg by mouth daily.    . vitamin C (ASCORBIC ACID) 500 MG tablet Take 500 mg  by mouth daily.      Current Facility-Administered Medications  Medication Dose Route Frequency Provider Last Rate Last Dose  . 0.9 %  sodium chloride infusion  500 mL Intravenous Continuous Irene Shipper, MD        No Known Allergies  Review of Systems negative except from HPI and PMH  Physical Exam   BP 106/78   Pulse 68   Ht 5\' 10"  (1.778 m)   Wt 169 lb 12.8 oz (77 kg)   SpO2 96%   BMI 24.36 kg/m  Well developed and nourished in no acute distress HENT normal Neck supple with JVP-flat Clear Irregular rate and rhythm, no murmurs or gallops Abd-soft with active BS No Clubbing cyanosis edema Skin-warm and dry A & Oriented  Grossly normal sensory and motor function    ECG  AV pacing with occ PVCs   Assessment and  Plan  Sinus bradycardia  Hypertension   Prior stroke  Atrial fibrillation-less than 6 hours  High-grade 2:1 and 3:2 heart block  Pacemaker-Medtronic   The patient's device was interrogated.  The information was reviewed. device was reprogrammed to maximize longevity      Overall doing quite well.  Blood pressure well controlled.  No significant exercise limitations.  His stroke 6 years ago was a retinal artery occlusion.  The question that I will try and get assistance with from neurology is the likelihood/possibility it could have been related to unbeknownst atrial fibrillation and whether this would inform a different approach to his SCAF.  For now we will continue him on antiplatelet therapy We spent more than 50% of our >25 min visit in face to face counseling regarding the above

## 2018-04-14 NOTE — Patient Instructions (Signed)
Medication Instructions:  Your physician recommends that you continue on your current medications as directed. Please refer to the Current Medication list given to you today.  Labwork: None ordered.  Testing/Procedures: None ordered.  Follow-Up: Your physician wants you to follow-up in: One Year with Dr Caryl Comes. You will receive a reminder letter in the mail two months in advance. If you don't receive a letter, please call our office to schedule the follow-up appointment.  Remote monitoring is used to monitor your Pacemaker of ICD from home. This monitoring reduces the number of office visits required to check your device to one time per year. It allows [place] to keep an eye on the functioning of your device to ensure it is working properly. You are scheduled for a device check from home on 05/31/2018. You may send your transmission at any time that day. If you have a wireless device, the transmission will be sent automatically. After your physician reviews your transmission, you will receive a postcard with your next transmission date.    Any Other Special Instructions Will Be Listed Below (If Applicable).     If you need a refill on your cardiac medications before your next appointment, please call your pharmacy.

## 2018-04-15 ENCOUNTER — Encounter: Payer: Self-pay | Admitting: Internal Medicine

## 2018-04-15 NOTE — Progress Notes (Signed)
Spoke with Dr Erlinda Hong and reviewed records form [facility]  CRAO was the diagnosis  No specific etiology was identified and DAPT was initiated (2010);  While carotid sources are the more likely, cardioembolic is [id number] 5-95% of the time, mostly with younger pts  Now with SCAF-- potentially related though the hiatus ( and > 3 yrs of negative monitoring ) makes it unlikely  I have tried toreach out to the patient to have the discussion and will try to navigate the unknown--ie do we stop plavix and use anticoagulation

## 2018-05-07 DIAGNOSIS — Z23 Encounter for immunization: Secondary | ICD-10-CM | POA: Diagnosis not present

## 2018-05-31 ENCOUNTER — Ambulatory Visit (INDEPENDENT_AMBULATORY_CARE_PROVIDER_SITE_OTHER): Payer: Medicare Other | Admitting: *Deleted

## 2018-05-31 DIAGNOSIS — I441 Atrioventricular block, second degree: Secondary | ICD-10-CM

## 2018-05-31 DIAGNOSIS — R001 Bradycardia, unspecified: Secondary | ICD-10-CM

## 2018-05-31 NOTE — Progress Notes (Signed)
Remote pacemaker transmission.   

## 2018-06-02 DIAGNOSIS — Z85828 Personal history of other malignant neoplasm of skin: Secondary | ICD-10-CM | POA: Diagnosis not present

## 2018-06-02 DIAGNOSIS — D1801 Hemangioma of skin and subcutaneous tissue: Secondary | ICD-10-CM | POA: Diagnosis not present

## 2018-06-02 DIAGNOSIS — D2261 Melanocytic nevi of right upper limb, including shoulder: Secondary | ICD-10-CM | POA: Diagnosis not present

## 2018-06-02 DIAGNOSIS — L812 Freckles: Secondary | ICD-10-CM | POA: Diagnosis not present

## 2018-06-02 DIAGNOSIS — D2262 Melanocytic nevi of left upper limb, including shoulder: Secondary | ICD-10-CM | POA: Diagnosis not present

## 2018-06-02 DIAGNOSIS — L821 Other seborrheic keratosis: Secondary | ICD-10-CM | POA: Diagnosis not present

## 2018-06-02 DIAGNOSIS — L570 Actinic keratosis: Secondary | ICD-10-CM | POA: Diagnosis not present

## 2018-06-02 DIAGNOSIS — Z85820 Personal history of malignant melanoma of skin: Secondary | ICD-10-CM | POA: Diagnosis not present

## 2018-07-31 LAB — CUP PACEART REMOTE DEVICE CHECK
Battery Remaining Longevity: 64 mo
Battery Voltage: 3 V
Brady Statistic AP VP Percent: 60.76 %
Brady Statistic AP VS Percent: 0.17 %
Brady Statistic AS VP Percent: 38.14 %
Brady Statistic AS VS Percent: 0.93 %
Brady Statistic RA Percent Paced: 59.34 %
Brady Statistic RV Percent Paced: 97.14 %
Date Time Interrogation Session: 20191029132655
Implantable Lead Implant Date: 20160518
Implantable Lead Implant Date: 20160518
Implantable Lead Location: 753859
Implantable Lead Location: 753860
Implantable Lead Model: 5076
Implantable Lead Model: 5076
Implantable Pulse Generator Implant Date: 20160518
Lead Channel Impedance Value: 342 Ohm
Lead Channel Impedance Value: 399 Ohm
Lead Channel Impedance Value: 456 Ohm
Lead Channel Impedance Value: 494 Ohm
Lead Channel Pacing Threshold Amplitude: 0.625 V
Lead Channel Pacing Threshold Amplitude: 0.75 V
Lead Channel Pacing Threshold Pulse Width: 0.4 ms
Lead Channel Pacing Threshold Pulse Width: 0.4 ms
Lead Channel Sensing Intrinsic Amplitude: 3.25 mV
Lead Channel Sensing Intrinsic Amplitude: 3.25 mV
Lead Channel Sensing Intrinsic Amplitude: 5.5 mV
Lead Channel Sensing Intrinsic Amplitude: 5.5 mV
Lead Channel Setting Pacing Amplitude: 1.5 V
Lead Channel Setting Pacing Amplitude: 2.5 V
Lead Channel Setting Pacing Pulse Width: 0.4 ms
Lead Channel Setting Sensing Sensitivity: 4 mV

## 2018-08-26 DIAGNOSIS — H6123 Impacted cerumen, bilateral: Secondary | ICD-10-CM | POA: Diagnosis not present

## 2018-08-26 DIAGNOSIS — H9193 Unspecified hearing loss, bilateral: Secondary | ICD-10-CM | POA: Diagnosis not present

## 2018-08-26 DIAGNOSIS — Z6825 Body mass index (BMI) 25.0-25.9, adult: Secondary | ICD-10-CM | POA: Diagnosis not present

## 2018-08-30 ENCOUNTER — Ambulatory Visit (INDEPENDENT_AMBULATORY_CARE_PROVIDER_SITE_OTHER): Payer: Medicare Other

## 2018-08-30 DIAGNOSIS — R001 Bradycardia, unspecified: Secondary | ICD-10-CM

## 2018-08-30 DIAGNOSIS — I441 Atrioventricular block, second degree: Secondary | ICD-10-CM

## 2018-08-31 NOTE — Progress Notes (Signed)
Remote pacemaker transmission.   

## 2018-09-02 LAB — CUP PACEART REMOTE DEVICE CHECK
Battery Remaining Longevity: 64 mo
Battery Voltage: 3 V
Brady Statistic AP VP Percent: 64.77 %
Brady Statistic AP VS Percent: 0.07 %
Brady Statistic AS VP Percent: 34.78 %
Brady Statistic AS VS Percent: 0.38 %
Brady Statistic RA Percent Paced: 64 %
Brady Statistic RV Percent Paced: 98.89 %
Date Time Interrogation Session: 20200128153309
Implantable Lead Implant Date: 20160518
Implantable Lead Implant Date: 20160518
Implantable Lead Location: 753859
Implantable Lead Location: 753860
Implantable Lead Model: 5076
Implantable Lead Model: 5076
Implantable Pulse Generator Implant Date: 20160518
Lead Channel Impedance Value: 342 Ohm
Lead Channel Impedance Value: 399 Ohm
Lead Channel Impedance Value: 475 Ohm
Lead Channel Impedance Value: 494 Ohm
Lead Channel Pacing Threshold Amplitude: 0.625 V
Lead Channel Pacing Threshold Amplitude: 0.625 V
Lead Channel Pacing Threshold Pulse Width: 0.4 ms
Lead Channel Pacing Threshold Pulse Width: 0.4 ms
Lead Channel Sensing Intrinsic Amplitude: 3.375 mV
Lead Channel Sensing Intrinsic Amplitude: 3.375 mV
Lead Channel Sensing Intrinsic Amplitude: 5.75 mV
Lead Channel Sensing Intrinsic Amplitude: 5.75 mV
Lead Channel Setting Pacing Amplitude: 1.5 V
Lead Channel Setting Pacing Amplitude: 2.5 V
Lead Channel Setting Pacing Pulse Width: 0.4 ms
Lead Channel Setting Sensing Sensitivity: 4 mV

## 2018-10-25 DIAGNOSIS — E782 Mixed hyperlipidemia: Secondary | ICD-10-CM | POA: Diagnosis not present

## 2018-10-25 DIAGNOSIS — Z125 Encounter for screening for malignant neoplasm of prostate: Secondary | ICD-10-CM | POA: Diagnosis not present

## 2018-10-25 DIAGNOSIS — I10 Essential (primary) hypertension: Secondary | ICD-10-CM | POA: Diagnosis not present

## 2018-10-26 DIAGNOSIS — I10 Essential (primary) hypertension: Secondary | ICD-10-CM | POA: Diagnosis not present

## 2018-10-26 DIAGNOSIS — R82998 Other abnormal findings in urine: Secondary | ICD-10-CM | POA: Diagnosis not present

## 2018-11-01 DIAGNOSIS — Z1331 Encounter for screening for depression: Secondary | ICD-10-CM | POA: Diagnosis not present

## 2018-11-01 DIAGNOSIS — H34239 Retinal artery branch occlusion, unspecified eye: Secondary | ICD-10-CM | POA: Diagnosis not present

## 2018-11-01 DIAGNOSIS — J310 Chronic rhinitis: Secondary | ICD-10-CM | POA: Diagnosis not present

## 2018-11-01 DIAGNOSIS — Z1339 Encounter for screening examination for other mental health and behavioral disorders: Secondary | ICD-10-CM | POA: Diagnosis not present

## 2018-11-01 DIAGNOSIS — Z0000 Encounter for general adult medical examination without abnormal findings: Secondary | ICD-10-CM | POA: Diagnosis not present

## 2018-11-01 DIAGNOSIS — M1990 Unspecified osteoarthritis, unspecified site: Secondary | ICD-10-CM | POA: Diagnosis not present

## 2018-11-01 DIAGNOSIS — I10 Essential (primary) hypertension: Secondary | ICD-10-CM | POA: Diagnosis not present

## 2018-11-01 DIAGNOSIS — E782 Mixed hyperlipidemia: Secondary | ICD-10-CM | POA: Diagnosis not present

## 2018-11-29 ENCOUNTER — Ambulatory Visit (INDEPENDENT_AMBULATORY_CARE_PROVIDER_SITE_OTHER): Payer: Medicare Other | Admitting: *Deleted

## 2018-11-29 ENCOUNTER — Other Ambulatory Visit: Payer: Self-pay

## 2018-11-29 DIAGNOSIS — L570 Actinic keratosis: Secondary | ICD-10-CM | POA: Diagnosis not present

## 2018-11-29 DIAGNOSIS — D485 Neoplasm of uncertain behavior of skin: Secondary | ICD-10-CM | POA: Diagnosis not present

## 2018-11-29 DIAGNOSIS — Z85828 Personal history of other malignant neoplasm of skin: Secondary | ICD-10-CM | POA: Diagnosis not present

## 2018-11-29 DIAGNOSIS — R001 Bradycardia, unspecified: Secondary | ICD-10-CM

## 2018-11-29 DIAGNOSIS — D225 Melanocytic nevi of trunk: Secondary | ICD-10-CM | POA: Diagnosis not present

## 2018-11-29 DIAGNOSIS — L821 Other seborrheic keratosis: Secondary | ICD-10-CM | POA: Diagnosis not present

## 2018-11-29 DIAGNOSIS — C44719 Basal cell carcinoma of skin of left lower limb, including hip: Secondary | ICD-10-CM | POA: Diagnosis not present

## 2018-11-29 DIAGNOSIS — D1801 Hemangioma of skin and subcutaneous tissue: Secondary | ICD-10-CM | POA: Diagnosis not present

## 2018-11-29 DIAGNOSIS — I441 Atrioventricular block, second degree: Secondary | ICD-10-CM

## 2018-11-29 DIAGNOSIS — D692 Other nonthrombocytopenic purpura: Secondary | ICD-10-CM | POA: Diagnosis not present

## 2018-11-29 LAB — CUP PACEART REMOTE DEVICE CHECK
Battery Remaining Longevity: 56 mo
Battery Voltage: 2.99 V
Brady Statistic AP VP Percent: 68.96 %
Brady Statistic AP VS Percent: 0.02 %
Brady Statistic AS VP Percent: 30.97 %
Brady Statistic AS VS Percent: 0.05 %
Brady Statistic RA Percent Paced: 68.78 %
Brady Statistic RV Percent Paced: 99.86 %
Date Time Interrogation Session: 20200428125953
Implantable Lead Implant Date: 20160518
Implantable Lead Implant Date: 20160518
Implantable Lead Location: 753859
Implantable Lead Location: 753860
Implantable Lead Model: 5076
Implantable Lead Model: 5076
Implantable Pulse Generator Implant Date: 20160518
Lead Channel Impedance Value: 342 Ohm
Lead Channel Impedance Value: 399 Ohm
Lead Channel Impedance Value: 456 Ohm
Lead Channel Impedance Value: 494 Ohm
Lead Channel Pacing Threshold Amplitude: 0.625 V
Lead Channel Pacing Threshold Amplitude: 0.75 V
Lead Channel Pacing Threshold Pulse Width: 0.4 ms
Lead Channel Pacing Threshold Pulse Width: 0.4 ms
Lead Channel Sensing Intrinsic Amplitude: 3.875 mV
Lead Channel Sensing Intrinsic Amplitude: 3.875 mV
Lead Channel Sensing Intrinsic Amplitude: 5 mV
Lead Channel Sensing Intrinsic Amplitude: 5 mV
Lead Channel Setting Pacing Amplitude: 1.5 V
Lead Channel Setting Pacing Amplitude: 2.5 V
Lead Channel Setting Pacing Pulse Width: 0.4 ms
Lead Channel Setting Sensing Sensitivity: 4 mV

## 2018-12-08 NOTE — Progress Notes (Signed)
Remote pacemaker transmission.   

## 2019-02-28 ENCOUNTER — Ambulatory Visit (INDEPENDENT_AMBULATORY_CARE_PROVIDER_SITE_OTHER): Payer: Medicare Other | Admitting: *Deleted

## 2019-02-28 DIAGNOSIS — I441 Atrioventricular block, second degree: Secondary | ICD-10-CM

## 2019-02-28 LAB — CUP PACEART REMOTE DEVICE CHECK
Battery Remaining Longevity: 55 mo
Battery Voltage: 2.99 V
Brady Statistic AP VP Percent: 67.25 %
Brady Statistic AP VS Percent: 0.01 %
Brady Statistic AS VP Percent: 32.67 %
Brady Statistic AS VS Percent: 0.06 %
Brady Statistic RA Percent Paced: 67.03 %
Brady Statistic RV Percent Paced: 99.87 %
Date Time Interrogation Session: 20200728115023
Implantable Lead Implant Date: 20160518
Implantable Lead Implant Date: 20160518
Implantable Lead Location: 753859
Implantable Lead Location: 753860
Implantable Lead Model: 5076
Implantable Lead Model: 5076
Implantable Pulse Generator Implant Date: 20160518
Lead Channel Impedance Value: 342 Ohm
Lead Channel Impedance Value: 399 Ohm
Lead Channel Impedance Value: 456 Ohm
Lead Channel Impedance Value: 513 Ohm
Lead Channel Pacing Threshold Amplitude: 0.625 V
Lead Channel Pacing Threshold Amplitude: 0.625 V
Lead Channel Pacing Threshold Pulse Width: 0.4 ms
Lead Channel Pacing Threshold Pulse Width: 0.4 ms
Lead Channel Sensing Intrinsic Amplitude: 3.25 mV
Lead Channel Sensing Intrinsic Amplitude: 3.25 mV
Lead Channel Sensing Intrinsic Amplitude: 5.125 mV
Lead Channel Sensing Intrinsic Amplitude: 5.125 mV
Lead Channel Setting Pacing Amplitude: 1.5 V
Lead Channel Setting Pacing Amplitude: 2.5 V
Lead Channel Setting Pacing Pulse Width: 0.4 ms
Lead Channel Setting Sensing Sensitivity: 4 mV

## 2019-03-13 ENCOUNTER — Encounter: Payer: Self-pay | Admitting: Cardiology

## 2019-03-13 NOTE — Progress Notes (Signed)
Remote pacemaker transmission.   

## 2019-03-30 DIAGNOSIS — Z23 Encounter for immunization: Secondary | ICD-10-CM | POA: Diagnosis not present

## 2019-05-09 ENCOUNTER — Encounter: Payer: Self-pay | Admitting: Internal Medicine

## 2019-05-09 ENCOUNTER — Other Ambulatory Visit: Payer: Self-pay

## 2019-05-09 ENCOUNTER — Ambulatory Visit (INDEPENDENT_AMBULATORY_CARE_PROVIDER_SITE_OTHER): Payer: Medicare Other | Admitting: Internal Medicine

## 2019-05-09 VITALS — BP 116/74 | HR 67 | Ht 70.0 in | Wt 162.6 lb

## 2019-05-09 DIAGNOSIS — R001 Bradycardia, unspecified: Secondary | ICD-10-CM | POA: Diagnosis not present

## 2019-05-09 DIAGNOSIS — I441 Atrioventricular block, second degree: Secondary | ICD-10-CM

## 2019-05-09 DIAGNOSIS — I10 Essential (primary) hypertension: Secondary | ICD-10-CM

## 2019-05-09 DIAGNOSIS — Z950 Presence of cardiac pacemaker: Secondary | ICD-10-CM

## 2019-05-09 NOTE — Patient Instructions (Signed)
Medication Instructions:  Your physician recommends that you continue on your current medications as directed. Please refer to the Current Medication list given to you today.  *If you need a refill on your cardiac medications before your next appointment, please call your pharmacy*  Labwork: None ordered  Testing/Procedures: None ordered  Follow-Up: Remote monitoring is used to monitor your Pacemaker or ICD from home. This monitoring reduces the number of office visits required to check your device to one time per year. It allows [place] to keep an eye on the functioning of your device to ensure it is working properly. You are scheduled for a device check from home on 05/30/19. You may send your transmission at any time that day. If you have a wireless device, the transmission will be sent automatically. After your physician reviews your transmission, you will receive a postcard with your next transmission date.  Your physician wants you to follow-up in: 1 year with Dr. Caryl Comes.  You will receive a reminder letter in the mail two months in advance. If you don't receive a letter, please call our office to schedule the follow-up appointment.  Thank you for choosing [facility]!!    Any Other Special Instructions Will Be Listed Below (If Applicable).

## 2019-05-09 NOTE — Progress Notes (Signed)
Patient Care Team: [name], MD as PCP - General (Internal Medicine)  . HPI  Alexander Murray is a 78 y.o. male Seen in follow-up for symptomatic high-grade heart block. He underwent pacing 2016   He has a history of prior retinal artery occlusion and no source was ever identified. He is on aspirin and Plavix which he takes every other day.  The patient denies chest pain, shortness of breath, nocturnal dyspnea, orthopnea or peripheral edema.  There have been no palpitations, lightheadedness or syncope.     DATE TEST EF   4/16 Myoview    % No Ischemia  4/16 Echo   55-60 % LAE mild (42/3.2/24)           Past Medical History:  Diagnosis Date  . Arthritis    "right knee and right thumb" (12/19/2014)  . Asthma   . Bronchitis 07/2016   started in December and continued about 2 months  . Esophageal stricture   . GERD (gastroesophageal reflux disease)   . Hemochromatosis   . Hypercalcemia   . Hypercholesteremia   . Hypertension   . Osteoarthritis   . Palpitations   . Presence of permanent cardiac pacemaker    hx bradycardia  . Retinal artery occlusion, branch    "right eye"  . Second degree AV block, Mobitz type II   . Skin cancer    right shoulder  . Stroke [facility]) ~ 2011   "right eye"/ partial blindness    Past Surgical History:  Procedure Laterality Date  . CATARACT EXTRACTION, BILATERAL  01/2017  . COLONOSCOPY    . EP IMPLANTABLE DEVICE N/A 12/19/2014   Procedure: Pacemaker Implant;  Surgeon: Deboraha Sprang, MD;  Location: [place] [facility];  Service: Cardiovascular;  Laterality: N/A;  . ESOPHAGOGASTRODUODENOSCOPY (EGD) WITH ESOPHAGEAL DILATION  X 3  . INGUINAL HERNIA REPAIR Left 1980's  . INSERT / REPLACE / REMOVE PACEMAKER  12/19/2014  . KNEE ARTHROSCOPY Right ~ 1982; ~ 1992  . MOHS SURGERY Right ~ 2014   "pre-melanoma scapula"  . POSTERIOR TIBIAL TENDON REPAIR Left 2012  . TONSILLECTOMY  1946  . TOTAL KNEE ARTHROPLASTY Right 05/20/2015   Procedure: RIGHT TOTAL KNEE ARTHROPLASTY;  Surgeon: Paralee Cancel, MD;  Location: [facility];  Service: Orthopedics;  Laterality: Right;  . UPPER GASTROINTESTINAL ENDOSCOPY      Current Outpatient Medications  Medication Sig Dispense Refill  . acetaminophen (TYLENOL) 325 MG tablet Take 2 tablets (650 mg total) by mouth every 6 (six) hours as needed for mild pain or moderate pain.    Marland Kitchen amLODipine (NORVASC) 2.5 MG tablet Take 2.5 mg by mouth at bedtime.    Marland Kitchen aspirin 81 MG tablet Take 81 mg by mouth daily.    . benzonatate (TESSALON) 200 MG capsule Take 1 capsule (200 mg total) by mouth 3 (three) times daily as needed for cough. 45 capsule 2  . celecoxib (CELEBREX) 200 MG capsule Take 200 mg by mouth 2 (two) times daily as needed for mild pain.    . chlorthalidone (HYGROTON) 25 MG tablet Take 25 mg by mouth every other day.     . clopidogrel (PLAVIX) 75 MG tablet Take 75 mg by mouth every other day.    . doxazosin (CARDURA) 4 MG tablet Take 1 tablet by mouth daily.    Marland Kitchen esomeprazole (NEXIUM) 20 MG packet Take 20 mg by mouth daily before breakfast.    . Misc Natural Products (GLUCOSAMINE CHONDROITIN ADV PO) Take 1,500  mg by mouth daily.    . mometasone (NASONEX) 50 MCG/ACT nasal spray Place 2 sprays into the nose daily as needed.    . montelukast (SINGULAIR) 10 MG tablet Take 10 mg by mouth every morning.     . Multiple Vitamin (MULTIVITAMIN) capsule Take 1 capsule by mouth daily.    . Multiple Vitamins-Minerals (PRESERVISION AREDS 2 PO) Take 1 capsule by mouth daily.    . simvastatin (ZOCOR) 40 MG tablet Take 40 mg by mouth daily.    . vitamin C (ASCORBIC ACID) 500 MG tablet Take 500 mg by mouth daily.      Current Facility-Administered Medications  Medication Dose Route Frequency Provider Last Rate Last Dose  . 0.9 %  sodium chloride infusion  500 mL Intravenous Continuous Irene Shipper, MD        No Known Allergies  Review of Systems negative except from HPI and PMH  Physical Exam   BP  116/74   Pulse 67   Ht 5\' 10"  (1.778 m)   Wt 162 lb 9.6 oz (73.8 kg)   SpO2 97%   BMI 23.33 kg/m  Well developed and well nourished in no acute distress HENT normal Neck supple with JVP-flat Clear Device pocket well healed; without hematoma or erythema.  There is no tethering  Regular rate and rhythm, no murmur Abd-soft with active BS No Clubbing cyanosis   edema Skin-warm and dry A & Oriented  Grossly normal sensory and motor function  ECG AV pacing    Assessment and  Plan  Sinus bradycardia  Hypertension   Prior stroke  Atrial fibrillation/flutter less than 12 hours   High-grade 2:1 and 3:2 heart block  Pacemaker-Medtronic  The patient's device was interrogated.  The information was reviewed. No changes were made in the programming.    Discussed SCAF to data and episodes are remote 10 hours long.  Hence no anticoagulation is indicated.  We will continue him on his clopidogrel.  Blood pressures well controlled.

## 2019-05-10 ENCOUNTER — Other Ambulatory Visit: Payer: Self-pay

## 2019-05-10 DIAGNOSIS — H353132 Nonexudative age-related macular degeneration, bilateral, intermediate dry stage: Secondary | ICD-10-CM | POA: Diagnosis not present

## 2019-05-10 DIAGNOSIS — H524 Presbyopia: Secondary | ICD-10-CM | POA: Diagnosis not present

## 2019-05-10 DIAGNOSIS — H3411 Central retinal artery occlusion, right eye: Secondary | ICD-10-CM | POA: Diagnosis not present

## 2019-05-10 DIAGNOSIS — Z20822 Contact with and (suspected) exposure to covid-19: Secondary | ICD-10-CM

## 2019-05-10 DIAGNOSIS — H04123 Dry eye syndrome of bilateral lacrimal glands: Secondary | ICD-10-CM | POA: Diagnosis not present

## 2019-05-10 DIAGNOSIS — Z20828 Contact with and (suspected) exposure to other viral communicable diseases: Secondary | ICD-10-CM | POA: Diagnosis not present

## 2019-05-10 LAB — CUP PACEART INCLINIC DEVICE CHECK
Battery Remaining Longevity: 55 mo
Battery Voltage: 2.99 V
Brady Statistic AP VP Percent: 66.7 %
Brady Statistic AP VS Percent: 0.04 %
Brady Statistic AS VP Percent: 33.09 %
Brady Statistic AS VS Percent: 0.18 %
Brady Statistic RA Percent Paced: 66.29 %
Brady Statistic RV Percent Paced: 99.51 %
Date Time Interrogation Session: 20201006194014
Implantable Lead Implant Date: 20160518
Implantable Lead Implant Date: 20160518
Implantable Lead Location: 753859
Implantable Lead Location: 753860
Implantable Lead Model: 5076
Implantable Lead Model: 5076
Implantable Pulse Generator Implant Date: 20160518
Lead Channel Impedance Value: 342 Ohm
Lead Channel Impedance Value: 380 Ohm
Lead Channel Impedance Value: 456 Ohm
Lead Channel Impedance Value: 513 Ohm
Lead Channel Pacing Threshold Amplitude: 0.625 V
Lead Channel Pacing Threshold Amplitude: 0.75 V
Lead Channel Pacing Threshold Pulse Width: 0.4 ms
Lead Channel Pacing Threshold Pulse Width: 0.4 ms
Lead Channel Sensing Intrinsic Amplitude: 2.875 mV
Lead Channel Sensing Intrinsic Amplitude: 4 mV
Lead Channel Sensing Intrinsic Amplitude: 5.25 mV
Lead Channel Sensing Intrinsic Amplitude: 5.25 mV
Lead Channel Setting Pacing Amplitude: 1.5 V
Lead Channel Setting Pacing Amplitude: 2.5 V
Lead Channel Setting Pacing Pulse Width: 0.4 ms
Lead Channel Setting Sensing Sensitivity: 4 mV

## 2019-05-12 LAB — NOVEL CORONAVIRUS, NAA: SARS-CoV-2, NAA: NOT DETECTED

## 2019-05-30 ENCOUNTER — Ambulatory Visit (INDEPENDENT_AMBULATORY_CARE_PROVIDER_SITE_OTHER): Payer: Medicare Other | Admitting: *Deleted

## 2019-05-30 DIAGNOSIS — R001 Bradycardia, unspecified: Secondary | ICD-10-CM

## 2019-05-30 NOTE — Telephone Encounter (Signed)
Transmission is pending processing, but only shows one episode, period. Short period (1 min 5 seconds) of Afib/AT around midnight 05/26/2019.   No episodes to correlate with his symptoms.

## 2019-05-31 DIAGNOSIS — L820 Inflamed seborrheic keratosis: Secondary | ICD-10-CM | POA: Diagnosis not present

## 2019-05-31 DIAGNOSIS — L812 Freckles: Secondary | ICD-10-CM | POA: Diagnosis not present

## 2019-05-31 DIAGNOSIS — Z85828 Personal history of other malignant neoplasm of skin: Secondary | ICD-10-CM | POA: Diagnosis not present

## 2019-05-31 DIAGNOSIS — D1801 Hemangioma of skin and subcutaneous tissue: Secondary | ICD-10-CM | POA: Diagnosis not present

## 2019-05-31 DIAGNOSIS — L821 Other seborrheic keratosis: Secondary | ICD-10-CM | POA: Diagnosis not present

## 2019-05-31 DIAGNOSIS — D692 Other nonthrombocytopenic purpura: Secondary | ICD-10-CM | POA: Diagnosis not present

## 2019-05-31 LAB — CUP PACEART REMOTE DEVICE CHECK
Battery Remaining Longevity: 52 mo
Battery Voltage: 2.99 V
Brady Statistic AP VP Percent: 61.8 %
Brady Statistic AP VS Percent: 0.01 %
Brady Statistic AS VP Percent: 38.09 %
Brady Statistic AS VS Percent: 0.1 %
Brady Statistic RA Percent Paced: 61.64 %
Brady Statistic RV Percent Paced: 99.85 %
Date Time Interrogation Session: 20201027113934
Implantable Lead Implant Date: 20160518
Implantable Lead Implant Date: 20160518
Implantable Lead Location: 753859
Implantable Lead Location: 753860
Implantable Lead Model: 5076
Implantable Lead Model: 5076
Implantable Pulse Generator Implant Date: 20160518
Lead Channel Impedance Value: 323 Ohm
Lead Channel Impedance Value: 380 Ohm
Lead Channel Impedance Value: 437 Ohm
Lead Channel Impedance Value: 475 Ohm
Lead Channel Pacing Threshold Amplitude: 0.625 V
Lead Channel Pacing Threshold Amplitude: 0.75 V
Lead Channel Pacing Threshold Pulse Width: 0.4 ms
Lead Channel Pacing Threshold Pulse Width: 0.4 ms
Lead Channel Sensing Intrinsic Amplitude: 3.5 mV
Lead Channel Sensing Intrinsic Amplitude: 3.5 mV
Lead Channel Sensing Intrinsic Amplitude: 4.5 mV
Lead Channel Sensing Intrinsic Amplitude: 4.5 mV
Lead Channel Setting Pacing Amplitude: 1.5 V
Lead Channel Setting Pacing Amplitude: 2.5 V
Lead Channel Setting Pacing Pulse Width: 0.4 ms
Lead Channel Setting Sensing Sensitivity: 4 mV

## 2019-06-21 NOTE — Progress Notes (Signed)
Remote pacemaker transmission.   

## 2019-08-04 DIAGNOSIS — I4891 Unspecified atrial fibrillation: Secondary | ICD-10-CM

## 2019-08-04 HISTORY — DX: Unspecified atrial fibrillation: I48.91

## 2019-08-29 ENCOUNTER — Ambulatory Visit (INDEPENDENT_AMBULATORY_CARE_PROVIDER_SITE_OTHER): Payer: Medicare Other | Admitting: *Deleted

## 2019-08-29 DIAGNOSIS — R001 Bradycardia, unspecified: Secondary | ICD-10-CM

## 2019-08-30 LAB — CUP PACEART REMOTE DEVICE CHECK
Battery Remaining Longevity: 50 mo
Battery Voltage: 2.99 V
Brady Statistic AP VP Percent: 68.55 %
Brady Statistic AP VS Percent: 0.01 %
Brady Statistic AS VP Percent: 31.39 %
Brady Statistic AS VS Percent: 0.05 %
Brady Statistic RA Percent Paced: 68.42 %
Brady Statistic RV Percent Paced: 99.92 %
Date Time Interrogation Session: 20210126091856
Implantable Lead Implant Date: 20160518
Implantable Lead Implant Date: 20160518
Implantable Lead Location: 753859
Implantable Lead Location: 753860
Implantable Lead Model: 5076
Implantable Lead Model: 5076
Implantable Pulse Generator Implant Date: 20160518
Lead Channel Impedance Value: 323 Ohm
Lead Channel Impedance Value: 380 Ohm
Lead Channel Impedance Value: 456 Ohm
Lead Channel Impedance Value: 494 Ohm
Lead Channel Pacing Threshold Amplitude: 0.625 V
Lead Channel Pacing Threshold Amplitude: 0.75 V
Lead Channel Pacing Threshold Pulse Width: 0.4 ms
Lead Channel Pacing Threshold Pulse Width: 0.4 ms
Lead Channel Sensing Intrinsic Amplitude: 2.875 mV
Lead Channel Sensing Intrinsic Amplitude: 2.875 mV
Lead Channel Sensing Intrinsic Amplitude: 4.875 mV
Lead Channel Sensing Intrinsic Amplitude: 4.875 mV
Lead Channel Setting Pacing Amplitude: 1.5 V
Lead Channel Setting Pacing Amplitude: 2.5 V
Lead Channel Setting Pacing Pulse Width: 0.4 ms
Lead Channel Setting Sensing Sensitivity: 4 mV

## 2019-09-04 DIAGNOSIS — M79641 Pain in right hand: Secondary | ICD-10-CM | POA: Diagnosis not present

## 2019-09-04 DIAGNOSIS — M1811 Unilateral primary osteoarthritis of first carpometacarpal joint, right hand: Secondary | ICD-10-CM | POA: Diagnosis not present

## 2019-11-01 DIAGNOSIS — Z125 Encounter for screening for malignant neoplasm of prostate: Secondary | ICD-10-CM | POA: Diagnosis not present

## 2019-11-01 DIAGNOSIS — E782 Mixed hyperlipidemia: Secondary | ICD-10-CM | POA: Diagnosis not present

## 2019-11-07 DIAGNOSIS — R82998 Other abnormal findings in urine: Secondary | ICD-10-CM | POA: Diagnosis not present

## 2019-11-07 DIAGNOSIS — Z950 Presence of cardiac pacemaker: Secondary | ICD-10-CM | POA: Diagnosis not present

## 2019-11-07 DIAGNOSIS — M1990 Unspecified osteoarthritis, unspecified site: Secondary | ICD-10-CM | POA: Diagnosis not present

## 2019-11-07 DIAGNOSIS — Z1331 Encounter for screening for depression: Secondary | ICD-10-CM | POA: Diagnosis not present

## 2019-11-07 DIAGNOSIS — Z0000 Encounter for general adult medical examination without abnormal findings: Secondary | ICD-10-CM | POA: Diagnosis not present

## 2019-11-07 DIAGNOSIS — Z1212 Encounter for screening for malignant neoplasm of rectum: Secondary | ICD-10-CM | POA: Diagnosis not present

## 2019-11-07 DIAGNOSIS — H9193 Unspecified hearing loss, bilateral: Secondary | ICD-10-CM | POA: Diagnosis not present

## 2019-11-07 DIAGNOSIS — J310 Chronic rhinitis: Secondary | ICD-10-CM | POA: Diagnosis not present

## 2019-11-07 DIAGNOSIS — H34239 Retinal artery branch occlusion, unspecified eye: Secondary | ICD-10-CM | POA: Diagnosis not present

## 2019-11-07 DIAGNOSIS — E782 Mixed hyperlipidemia: Secondary | ICD-10-CM | POA: Diagnosis not present

## 2019-11-07 DIAGNOSIS — I10 Essential (primary) hypertension: Secondary | ICD-10-CM | POA: Diagnosis not present

## 2019-11-07 DIAGNOSIS — N320 Bladder-neck obstruction: Secondary | ICD-10-CM | POA: Diagnosis not present

## 2019-11-23 ENCOUNTER — Encounter: Payer: Self-pay | Admitting: Physician Assistant

## 2019-11-23 ENCOUNTER — Telehealth: Payer: Self-pay | Admitting: *Deleted

## 2019-11-23 ENCOUNTER — Ambulatory Visit (INDEPENDENT_AMBULATORY_CARE_PROVIDER_SITE_OTHER): Payer: Medicare Other | Admitting: Physician Assistant

## 2019-11-23 VITALS — BP 120/60 | HR 68 | Temp 97.8°F | Ht 69.0 in | Wt 163.2 lb

## 2019-11-23 DIAGNOSIS — Z86010 Personal history of colonic polyps: Secondary | ICD-10-CM

## 2019-11-23 DIAGNOSIS — Z7901 Long term (current) use of anticoagulants: Secondary | ICD-10-CM | POA: Diagnosis not present

## 2019-11-23 DIAGNOSIS — R1314 Dysphagia, pharyngoesophageal phase: Secondary | ICD-10-CM

## 2019-11-23 MED ORDER — NA SULFATE-K SULFATE-MG SULF 17.5-3.13-1.6 GM/177ML PO SOLN: 1.0000 | Freq: Once | ORAL | 0 refills | Status: AC

## 2019-11-23 NOTE — Patient Instructions (Addendum)
If you are age 81 or older, your body mass index should be between 23-30. Your Body mass index is 24.1 kg/m. If this is out of the aforementioned range listed, please consider follow up with your Primary Care Provider.  If you are age 82 or younger, your body mass index should be between 19-25. Your Body mass index is 24.1 kg/m. If this is out of the aformentioned range listed, please consider follow up with your Primary Care Provider.   You have been scheduled for a Barium Esophogram at [facility] (1st floor of the hospital) on Tuesday 12/05/19 at 11 am. Please arrive 15 minutes prior to your appointment for registration. Make certain not to have anything to eat or drink 3 hours prior to your test. If you need to reschedule for any reason, please contact radiology at [phone] to do so. __________________________________________________________________ A barium swallow is an examination that concentrates on views of the esophagus. This tends to be a double contrast exam (barium and two liquids which, when combined, create a gas to distend the wall of the oesophagus) or single contrast (non-ionic iodine based). The study is usually tailored to your symptoms so a good history is essential. Attention is paid during the study to the form, structure and configuration of the esophagus, looking for functional disorders (such as aspiration, dysphagia, achalasia, motility and reflux) EXAMINATION You may be asked to change into a gown, depending on the type of swallow being performed. A radiologist and radiographer will perform the procedure. The radiologist will advise you of the type of contrast selected for your procedure and direct you during the exam. You will be asked to stand, sit or lie in several different positions and to hold a small amount of fluid in your mouth before being asked to swallow while the imaging is performed .In some instances you may be asked to swallow barium coated  marshmallows to assess the motility of a solid food bolus. The exam can be recorded as a digital or video fluoroscopy procedure. POST PROCEDURE It will take 1-2 days for the barium to pass through your system. To facilitate this, it is important, unless otherwise directed, to increase your fluids for the next 24-48hrs and to resume your normal diet.  This test typically takes about 30 minutes to perform. _______________________________________________________________  Dennis Bast will be contacted by our office prior to your procedure for directions on holding your Plavix.  If you do not hear from our office 1 week prior to your scheduled procedure, please call [phone] to discuss.   You have been scheduled for an endoscopy and colonoscopy. Please follow the written instructions given to you at your visit today. Please pick up your prep supplies at the pharmacy within the next 1-3 days. If you use inhalers (even only as needed), please bring them with you on the day of your procedure.

## 2019-11-23 NOTE — Progress Notes (Addendum)
Chief Complaint: History of adenomatous polyps and dysphagia  HPI:    Alexander Murray is a 79 year old Caucasian male with a past medical history as listed below including stroke on Plavix, known to Dr. Henrene Pastor, who was referred to me by [name], MD for a complaint of history of adenomatous polyps and dysphagia.      08/09/2013 EGD with Dr. Sharlett Iles with inlet patch associated with stricture which was dilated.    10/27/2016 colonoscopy with nine 2-5 mm polyps in the transverse, ascending and ileocecal valve.  Repeat was recommended in 3 years.  Tubular adenomas.      02/11/2017 office visit with me for epigastric pain, reflux, decrease in appetite and nausea.  At that time continued on Nexium 40 mg daily for another 2 weeks and then told to decrease back to 20 mg daily.  Discussed that if symptoms worsen over the next 3 to 4 weeks and I recommend an EGD.    Today, the patient tells me that he has had some symptoms of dysphagia with liquids off and on over the past year or so.  Tells me this never happens with food.  He also has rare heartburn and reflux which is typically well controlled as long as he remembers to take his Nexium 20 mg daily.  If he forgets his Nexium he does feel a difference.  Tells me that there was some question about possible Barrett's years ago and he wants to make sure that everything is okay since he is having another colonoscopy.    Denies fever, chills, abdominal pain, nausea, vomiting, weight loss, blood in his stool or change in his bowel habits.  Past Medical History:  Diagnosis Date  . Arthritis    "right knee and right thumb" (12/19/2014)  . Asthma   . Bronchitis 07/2016   started in December and continued about 2 months  . Esophageal stricture   . GERD (gastroesophageal reflux disease)   . Hemochromatosis   . Hypercalcemia   . Hypercholesteremia   . Hypertension   . Osteoarthritis   . Palpitations   . Presence of permanent cardiac pacemaker    hx  bradycardia  . Retinal artery occlusion, branch    "right eye"  . Second degree AV block, Mobitz type II   . Skin cancer    right shoulder  . Stroke [facility]) ~ 2011   "right eye"/ partial blindness    Past Surgical History:  Procedure Laterality Date  . CATARACT EXTRACTION, BILATERAL  01/2017  . COLONOSCOPY    . EP IMPLANTABLE DEVICE N/A 12/19/2014   Procedure: Pacemaker Implant;  Surgeon: Deboraha Sprang, MD;  Location: [facility];  Service: Cardiovascular;  Laterality: N/A;  . ESOPHAGOGASTRODUODENOSCOPY (EGD) WITH ESOPHAGEAL DILATION  X 3  . INGUINAL HERNIA REPAIR Left 1980's  . INSERT / REPLACE / REMOVE PACEMAKER  12/19/2014  . KNEE ARTHROSCOPY Right ~ 1982; ~ 1992  . MOHS SURGERY Right ~ 2014   "pre-melanoma scapula"  . POSTERIOR TIBIAL TENDON REPAIR Left 2012  . TONSILLECTOMY  1946  . TOTAL KNEE ARTHROPLASTY Right 05/20/2015   Procedure: RIGHT TOTAL KNEE ARTHROPLASTY;  Surgeon: Paralee Cancel, MD;  Location: [facility];  Service: Orthopedics;  Laterality: Right;  . UPPER GASTROINTESTINAL ENDOSCOPY      Current Outpatient Medications  Medication Sig Dispense Refill  . acetaminophen (TYLENOL) 325 MG tablet Take 2 tablets (650 mg total) by mouth every 6 (six) hours as needed for mild pain or moderate pain.    Marland Kitchen  amLODipine (NORVASC) 2.5 MG tablet Take 2.5 mg by mouth at bedtime.    Marland Kitchen aspirin 81 MG tablet Take 81 mg by mouth daily.    . benzonatate (TESSALON) 200 MG capsule Take 1 capsule (200 mg total) by mouth 3 (three) times daily as needed for cough. 45 capsule 2  . celecoxib (CELEBREX) 200 MG capsule Take 200 mg by mouth 2 (two) times daily as needed for mild pain.    . chlorthalidone (HYGROTON) 25 MG tablet Take 25 mg by mouth every other day.     . clopidogrel (PLAVIX) 75 MG tablet Take 75 mg by mouth every other day.    . doxazosin (CARDURA) 4 MG tablet Take 1 tablet by mouth daily.    Marland Kitchen esomeprazole (NEXIUM) 20 MG packet Take 20 mg by mouth daily before breakfast.    .  Misc Natural Products (GLUCOSAMINE CHONDROITIN ADV PO) Take 1,500 mg by mouth daily.    . mometasone (NASONEX) 50 MCG/ACT nasal spray Place 2 sprays into the nose daily as needed.    . montelukast (SINGULAIR) 10 MG tablet Take 10 mg by mouth every morning.     . Multiple Vitamin (MULTIVITAMIN) capsule Take 1 capsule by mouth daily.    . Multiple Vitamins-Minerals (PRESERVISION AREDS 2 PO) Take 1 capsule by mouth daily.    . simvastatin (ZOCOR) 40 MG tablet Take 40 mg by mouth daily.    . vitamin C (ASCORBIC ACID) 500 MG tablet Take 500 mg by mouth daily.      Current Facility-Administered Medications  Medication Dose Route Frequency Provider Last Rate Last Admin  . 0.9 %  sodium chloride infusion  500 mL Intravenous Continuous Irene Shipper, MD        Allergies as of 11/23/2019  . (No Known Allergies)    Family History  Problem Relation Age of Onset  . Colon cancer Mother   . Heart disease Mother   . Alzheimer's disease Mother   . Colon cancer Father   . Heart disease Father   . Prostate cancer Father   . Prostate cancer Brother   . Hemochromatosis Brother     Social History   Socioeconomic History  . Marital status: Married    Spouse name: Not on file  . Number of children: Not on file  . Years of education: Not on file  . Highest education level: Not on file  Occupational History  . Occupation: retired - Doctor, general practice  Tobacco Use  . Smoking status: Former Smoker    Years: 3.00    Types: Cigarettes    Quit date: 05/16/1962    Years since quitting: 57.5  . Smokeless tobacco: Never Used  . Tobacco comment: "quit smoking in the 1960's"  Substance and Sexual Activity  . Alcohol use: No    Comment: 12/19/2014 "stopped drinking in ~ 2012"  . Drug use: No  . Sexual activity: Yes  Other Topics Concern  . Not on file  Social History Narrative  . Not on file   Social Determinants of Health   Financial Resource Strain:   . Difficulty of Paying Living Expenses:     Food Insecurity:   . Worried About Charity fundraiser in the Last Year:   . Arboriculturist in the Last Year:   Transportation Needs:   . Film/video editor (Medical):   Marland Kitchen Lack of Transportation (Non-Medical):   Physical Activity:   . Days of Exercise per Week:   .  Minutes of Exercise per Session:   Stress:   . Feeling of Stress :   Social Connections:   . Frequency of Communication with Friends and Family:   . Frequency of Social Gatherings with Friends and Family:   . Attends Religious Services:   . Active Member of Clubs or Organizations:   . Attends Archivist Meetings:   Marland Kitchen Marital Status:   Intimate Partner Violence:   . Fear of Current or Ex-Partner:   . Emotionally Abused:   Marland Kitchen Physically Abused:   . Sexually Abused:     Review of Systems:    Constitutional: No weight loss, fever or chills Cardiovascular: No chest pain Respiratory: No SOB  Gastrointestinal: See HPI and otherwise negative   Physical Exam:  Vital signs: BP 120/60   Pulse 68   Temp 97.8 F (36.6 C)   Ht 5\' 9"  (1.753 m)   Wt 163 lb 3.2 oz (74 kg)   BMI 24.10 kg/m   Constitutional:   Pleasant Caucasian male appears to be in NAD, Well developed, Well nourished, alert and cooperative Respiratory: Respirations even and unlabored. Lungs clear to auscultation bilaterally.   No wheezes, crackles, or rhonchi.  Cardiovascular: Normal S1, S2. No MRG. Regular rate and rhythm. No peripheral edema, cyanosis or pallor.  Gastrointestinal:  Soft, nondistended, nontender. No rebound or guarding. Normal bowel sounds. No appreciable masses or hepatomegaly. Rectal:  Not performed.  Psychiatric: Demonstrates good judgement and reason without abnormal affect or behaviors.  No recent labs/imaging.  Assessment: 1.  History of adenomatous polyps: Last colonoscopy 3 years ago with recommendations for repeat now 2.  Dysphagia: History of EGD 08/09/2013 with inlet patch, no dysplasia or metaplasia, was also  dilated at that time for symptoms 3. Chronic anticoagulation: with Plavix  Plan: 1.  Scheduled patient for EGD with dilation and surveillance colonoscopy for adenomatous polyps with Dr. Henrene Pastor in [facility].  Did discuss risks, benefits, limitations and alternatives and the patient agrees to proceed.  Discussed with the patient that we will order a barium swallow for further evaluation of his dysphagia.  If for some reason he does not need EGD we will cancel this.  Made sure to schedule the appointments far enough out that we can do this. 2.  Ordered a barium swallow with tablet. 3.  Continue Nexium 20 mg daily 4.  Advised the patient to hold his Plavix for 5 days prior to time of procedures.  We will communicate with his prescribing physician to ensure this is acceptable for him. 5.  Patient to follow in clinic per recommendations from Dr. Henrene Pastor after time of procedure.  Ellouise Newer, PA-C [facility] 11/23/2019, 2:21 PM  Cc: [name], MD

## 2019-11-23 NOTE — Progress Notes (Signed)
Reviewed.  Thank you for seeing [name] (retired orthopedic)

## 2019-11-23 NOTE — Telephone Encounter (Signed)
Faxed clearance request to hold Plavix to Dr. [name].

## 2019-11-28 ENCOUNTER — Ambulatory Visit (INDEPENDENT_AMBULATORY_CARE_PROVIDER_SITE_OTHER): Payer: Medicare Other | Admitting: *Deleted

## 2019-11-28 DIAGNOSIS — R001 Bradycardia, unspecified: Secondary | ICD-10-CM | POA: Diagnosis not present

## 2019-11-28 LAB — CUP PACEART REMOTE DEVICE CHECK
Battery Remaining Longevity: 45 mo
Battery Voltage: 2.98 V
Brady Statistic AP VP Percent: 74.2 %
Brady Statistic AP VS Percent: 0.02 %
Brady Statistic AS VP Percent: 25.68 %
Brady Statistic AS VS Percent: 0.1 %
Brady Statistic RA Percent Paced: 73.96 %
Brady Statistic RV Percent Paced: 99.82 %
Date Time Interrogation Session: 20210427081346
Implantable Lead Implant Date: 20160518
Implantable Lead Implant Date: 20160518
Implantable Lead Location: 753859
Implantable Lead Location: 753860
Implantable Lead Model: 5076
Implantable Lead Model: 5076
Implantable Pulse Generator Implant Date: 20160518
Lead Channel Impedance Value: 323 Ohm
Lead Channel Impedance Value: 361 Ohm
Lead Channel Impedance Value: 437 Ohm
Lead Channel Impedance Value: 456 Ohm
Lead Channel Pacing Threshold Amplitude: 0.625 V
Lead Channel Pacing Threshold Amplitude: 0.625 V
Lead Channel Pacing Threshold Pulse Width: 0.4 ms
Lead Channel Pacing Threshold Pulse Width: 0.4 ms
Lead Channel Sensing Intrinsic Amplitude: 3.75 mV
Lead Channel Sensing Intrinsic Amplitude: 3.75 mV
Lead Channel Sensing Intrinsic Amplitude: 6 mV
Lead Channel Sensing Intrinsic Amplitude: 6 mV
Lead Channel Setting Pacing Amplitude: 1.5 V
Lead Channel Setting Pacing Amplitude: 2.5 V
Lead Channel Setting Pacing Pulse Width: 0.4 ms
Lead Channel Setting Sensing Sensitivity: 4 mV

## 2019-11-29 NOTE — Progress Notes (Signed)
PPM Remote  

## 2019-12-01 NOTE — Telephone Encounter (Addendum)
Received clearance from Dr. [name] to hold Plavix 5 days before procedure on 01/05/20. Sent clearance approval to be scanned.

## 2019-12-01 NOTE — Telephone Encounter (Signed)
Patient's wife informed of clearance and when for patient to stop Plavix.

## 2019-12-04 DIAGNOSIS — Z85828 Personal history of other malignant neoplasm of skin: Secondary | ICD-10-CM | POA: Diagnosis not present

## 2019-12-04 DIAGNOSIS — D225 Melanocytic nevi of trunk: Secondary | ICD-10-CM | POA: Diagnosis not present

## 2019-12-04 DIAGNOSIS — D1801 Hemangioma of skin and subcutaneous tissue: Secondary | ICD-10-CM | POA: Diagnosis not present

## 2019-12-04 DIAGNOSIS — L820 Inflamed seborrheic keratosis: Secondary | ICD-10-CM | POA: Diagnosis not present

## 2019-12-04 DIAGNOSIS — L570 Actinic keratosis: Secondary | ICD-10-CM | POA: Diagnosis not present

## 2019-12-04 DIAGNOSIS — L821 Other seborrheic keratosis: Secondary | ICD-10-CM | POA: Diagnosis not present

## 2019-12-04 DIAGNOSIS — Z85820 Personal history of malignant melanoma of skin: Secondary | ICD-10-CM | POA: Diagnosis not present

## 2019-12-04 DIAGNOSIS — L812 Freckles: Secondary | ICD-10-CM | POA: Diagnosis not present

## 2019-12-04 DIAGNOSIS — D692 Other nonthrombocytopenic purpura: Secondary | ICD-10-CM | POA: Diagnosis not present

## 2019-12-05 ENCOUNTER — Ambulatory Visit (HOSPITAL_COMMUNITY)
Admission: RE | Admit: 2019-12-05 | Discharge: 2019-12-05 | Disposition: A | Discharge status: Home / Self Care | Payer: Medicare Other | Source: Ambulatory Visit | Attending: Physician Assistant | Admitting: Physician Assistant

## 2019-12-05 ENCOUNTER — Other Ambulatory Visit: Payer: Self-pay

## 2019-12-05 DIAGNOSIS — R1310 Dysphagia, unspecified: Secondary | ICD-10-CM | POA: Diagnosis not present

## 2019-12-05 DIAGNOSIS — R1314 Dysphagia, pharyngoesophageal phase: Secondary | ICD-10-CM | POA: Diagnosis not present

## 2019-12-05 IMAGING — RF DG ESOPHAGUS
6 series · 21 of 23 positions shown · non-contrast
Comparison: Report of endoscopy of [DATE], this describes an
"inlet pouch" that was dilated.

CLINICAL DATA: Dysphagia. History of multiple prior esophageal
dilatations.

EXAM:
ESOPHOGRAM/BARIUM SWALLOW
TECHNIQUE: Single contrast examination was performed using thin barium or water
soluble.
FLUOROSCOPY TIME:  Fluoroscopy Time:  1 minutes and 42 seconds
Radiation Exposure Index (if provided by the fluoroscopic device):
18.8 mGy
Number of Acquired Spot Images: 0

[Series 1: cp_standard · 0.54mm/px · 4 of 188 frames shown (1 of 6)]
[frame 29/188]
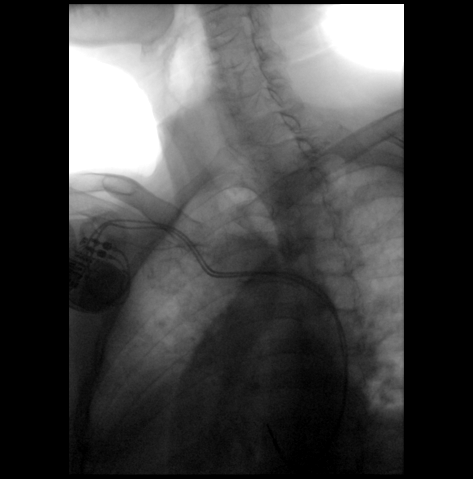
[frame 41/188]
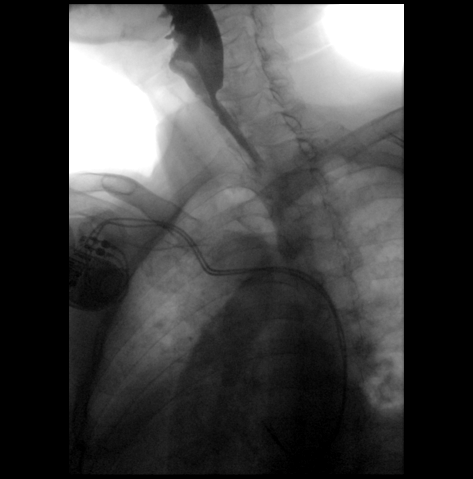
[frame 95/188]
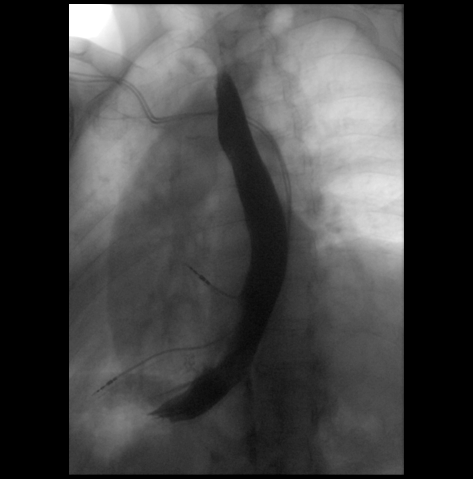
[frame 160/188]
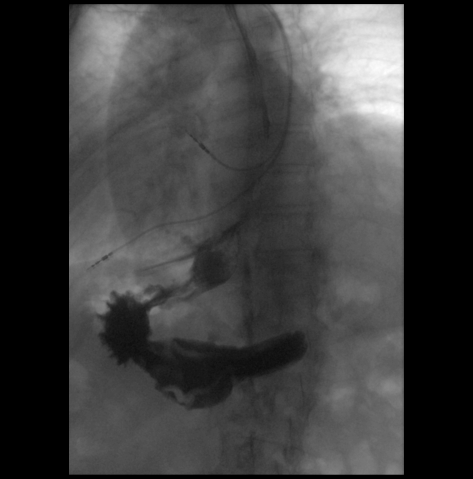

[Series 2: cp_standard · 0.54mm/px · 3 of 174 frames shown (2 of 6)]
[frame 27/174]
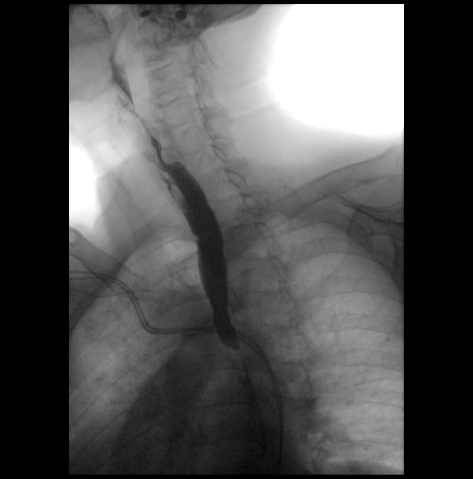
[frame 137/174]
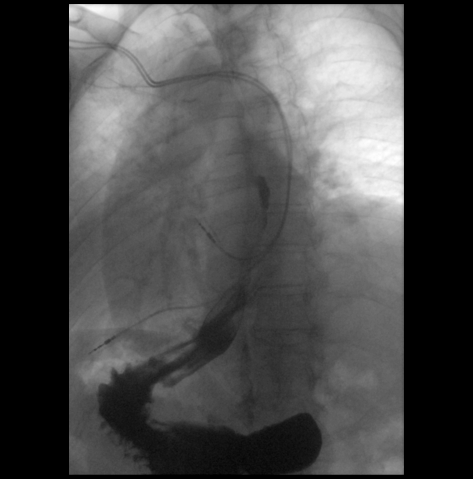
[frame 148/174]
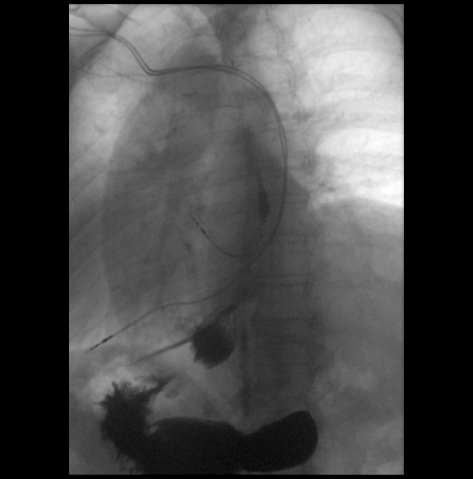

[Series 3: cp_standard · 0.54mm/px · 4 of 152 frames shown (3 of 6)]
[frame 23/152]
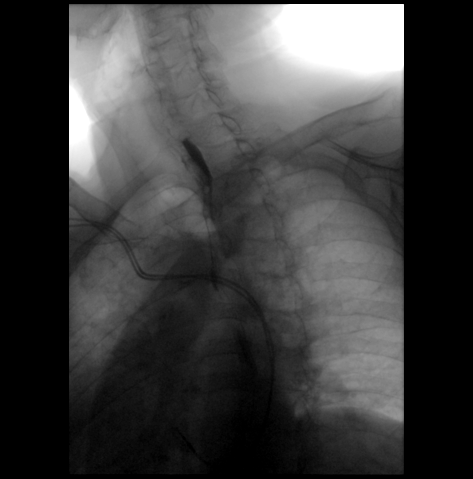
[frame 36/152]
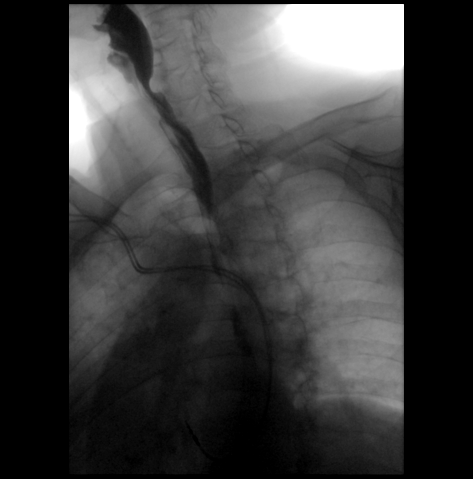
[frame 77/152]
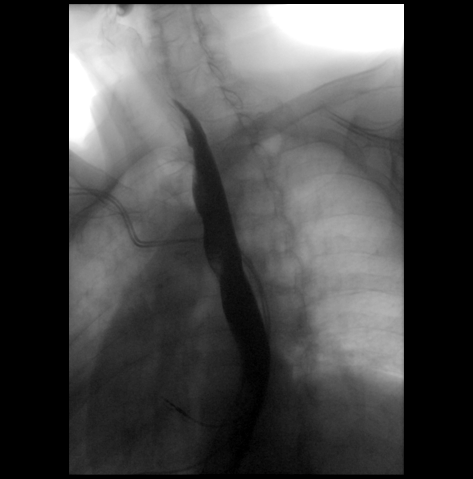
[frame 130/152]
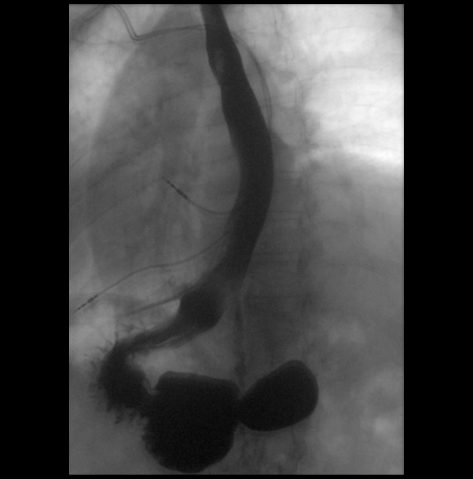

[Series 4: cp_standard · 0.54mm/px · 4 of 148 frames shown (4 of 6)]
[frame 23/148]
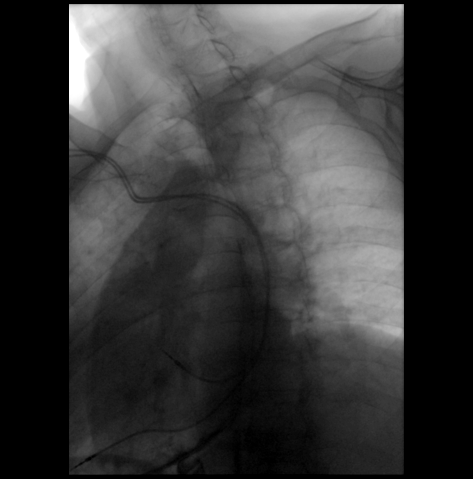
[frame 39/148]
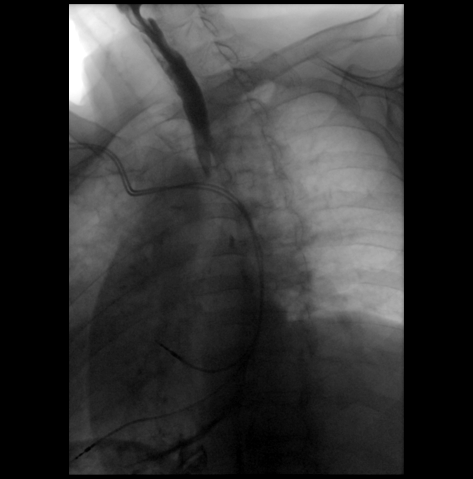
[frame 75/148]
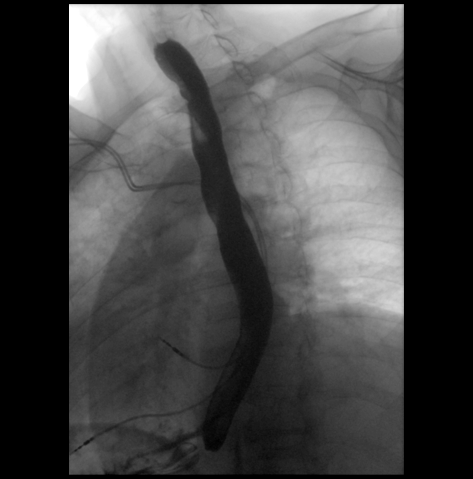
[frame 126/148]
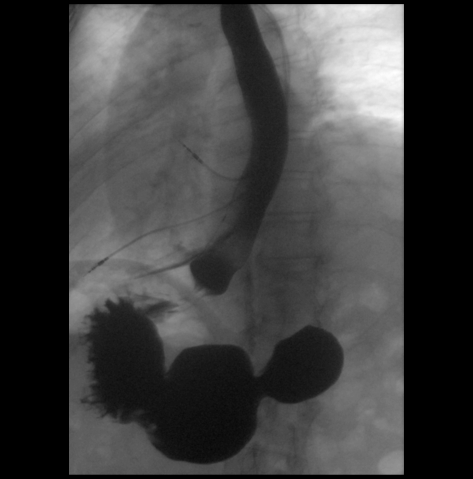

[Series 5: cp_standard · 0.36mm/px · 3 of 72 frames shown (5 of 6)]
[frame 1/72]
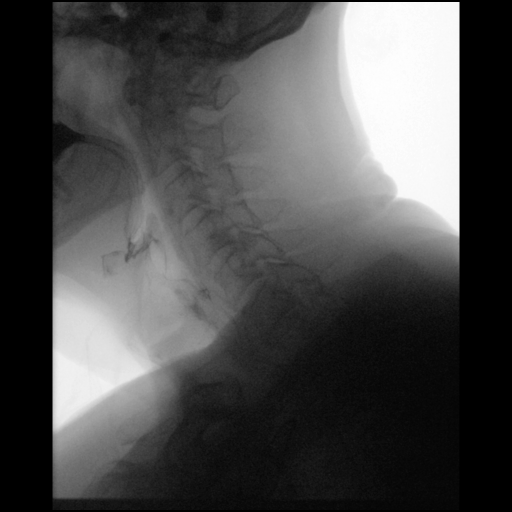
[frame 37/72]
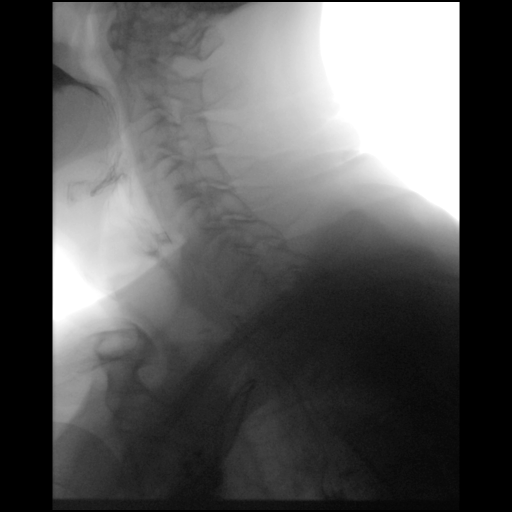
[frame 62/72]
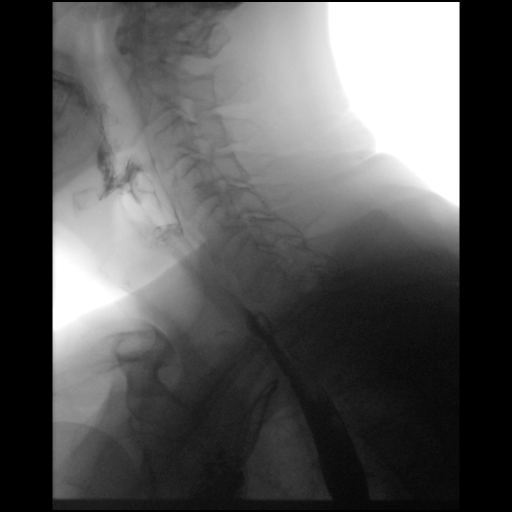

[Series 6: cp_standard · 0.36mm/px · 3 of 50 frames shown (6 of 6)]
[frame 8/50]
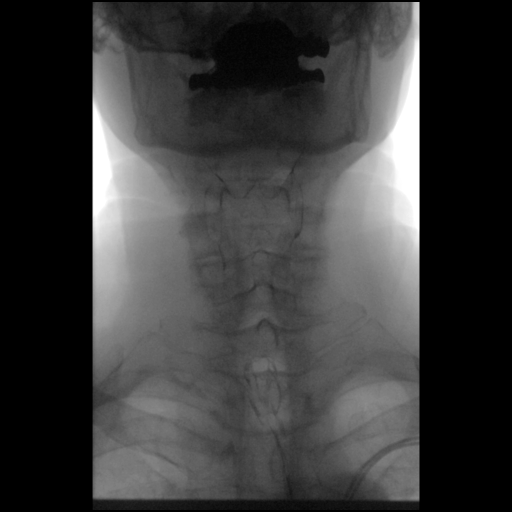
[frame 26/50]
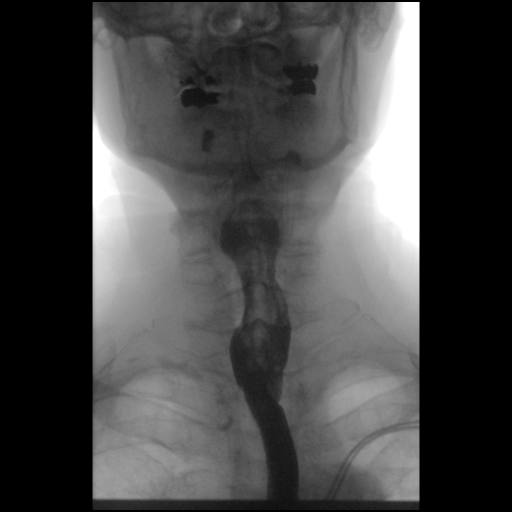
[frame 43/50]
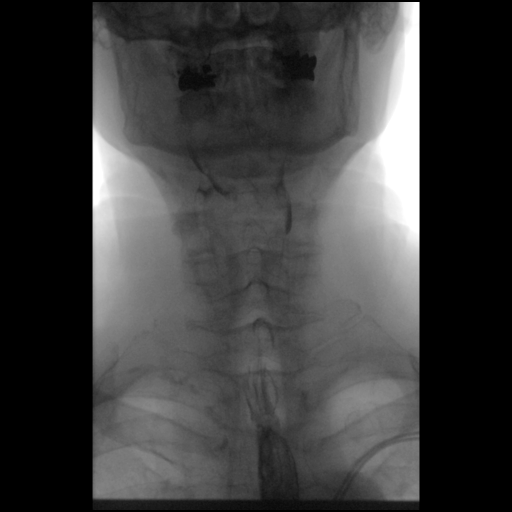

[21 of 23 positions shown; findings below may reference images not displayed]

FINDINGS: Evaluation of esophageal peristalsis is normal.

Full column evaluation of the esophagus demonstrates normal
appearance of the thoracic esophagus, without persistent narrowing
or stricture.

Evaluation of the hypopharynx demonstrates a prominent
cricopharyngeus muscle and pre cricoid impression. This results in
narrowing of the contrast column of greater than 50%, including on
49/5.
IMPRESSION: Narrowing of the contrast column at the level of the
cricopharyngeus, as detailed above. No delay of contrast passage at
this point. Normal remainder of the esophagus.

## 2020-01-05 ENCOUNTER — Ambulatory Visit (AMBULATORY_SURGERY_CENTER): Payer: Medicare Other | Admitting: Internal Medicine

## 2020-01-05 ENCOUNTER — Other Ambulatory Visit: Payer: Self-pay

## 2020-01-05 ENCOUNTER — Encounter: Payer: Self-pay | Admitting: Internal Medicine

## 2020-01-05 VITALS — BP 140/80 | HR 68 | Temp 96.8°F | Resp 14 | Ht 69.0 in | Wt 163.0 lb

## 2020-01-05 DIAGNOSIS — K449 Diaphragmatic hernia without obstruction or gangrene: Secondary | ICD-10-CM

## 2020-01-05 DIAGNOSIS — D123 Benign neoplasm of transverse colon: Secondary | ICD-10-CM | POA: Diagnosis not present

## 2020-01-05 DIAGNOSIS — K219 Gastro-esophageal reflux disease without esophagitis: Secondary | ICD-10-CM | POA: Diagnosis not present

## 2020-01-05 DIAGNOSIS — R1314 Dysphagia, pharyngoesophageal phase: Secondary | ICD-10-CM | POA: Diagnosis not present

## 2020-01-05 DIAGNOSIS — D124 Benign neoplasm of descending colon: Secondary | ICD-10-CM

## 2020-01-05 DIAGNOSIS — D122 Benign neoplasm of ascending colon: Secondary | ICD-10-CM

## 2020-01-05 DIAGNOSIS — I10 Essential (primary) hypertension: Secondary | ICD-10-CM | POA: Diagnosis not present

## 2020-01-05 DIAGNOSIS — Z86010 Personal history of colonic polyps: Secondary | ICD-10-CM

## 2020-01-05 DIAGNOSIS — K222 Esophageal obstruction: Secondary | ICD-10-CM

## 2020-01-05 DIAGNOSIS — R1310 Dysphagia, unspecified: Secondary | ICD-10-CM | POA: Diagnosis not present

## 2020-01-05 DIAGNOSIS — D120 Benign neoplasm of cecum: Secondary | ICD-10-CM | POA: Diagnosis not present

## 2020-01-05 MED ORDER — SODIUM CHLORIDE 0.9 % IV SOLN: 500.0000 mL | Freq: Once | INTRAVENOUS | Status: DC

## 2020-01-05 NOTE — Op Note (Signed)
[facility] Patient Name: Alexander Murray Procedure Date: 01/05/2020 1:34 PM MRN: [id number] Endoscopist: Docia Chuck. Henrene Pastor , MD Age: 79 Referring MD:  Date of Birth: Apr 12, 1941 Gender: Male Account #: [id number] Procedure:                Colonoscopy with cold snare polypectomy x 6 Indications:              High risk colon cancer surveillance: Personal                            history of multiple (3 or more) adenomas. Most                            recent examination 2018 Medicines:                Monitored Anesthesia Care Procedure:                Pre-Anesthesia Assessment:                           - Prior to the procedure, a History and Physical                            was performed, and patient medications and                            allergies were reviewed. The patient's tolerance of                            previous anesthesia was also reviewed. The risks                            and benefits of the procedure and the sedation                            options and risks were discussed with the patient.                            All questions were answered, and informed consent                            was obtained. Prior Anticoagulants: The patient has                            been on Plavix. Last dose 6 days ago. ASA Grade                            Assessment: II - A patient with mild systemic                            disease. After reviewing the risks and benefits,                            the patient was deemed in satisfactory condition to  undergo the procedure.                           After obtaining informed consent, the colonoscope                            was passed under direct vision. Throughout the                            procedure, the patient's blood pressure, pulse, and                            oxygen saturations were monitored continuously. The                            Colonoscope was introduced  through the anus and                            advanced to the the cecum, identified by                            appendiceal orifice and ileocecal valve. The                            ileocecal valve, appendiceal orifice, and rectum                            were photographed. The quality of the bowel                            preparation was excellent. The colonoscopy was                            performed without difficulty. The patient tolerated                            the procedure well. The bowel preparation used was                            SUPREP via split dose instruction. Scope In: 1:48:02 PM Scope Out: 2:08:52 PM Scope Withdrawal Time: 0 hours 16 minutes 1 second  Total Procedure Duration: 0 hours 20 minutes 50 seconds  Findings:                 Six polyps were found in the descending colon,                            transverse colon, ascending colon and cecum. The                            polyps were 1 to 3 mm in size. These polyps were                            removed with a cold snare. Resection and retrieval  were complete.                           The exam was otherwise without abnormality on                            direct and retroflexion views. Internal hemorrhoids                            present Complications:            No immediate complications. Estimated blood loss:                            None. Estimated Blood Loss:     Estimated blood loss: none. Impression:               - Six 1 to 3 mm polyps in the descending colon, in                            the transverse colon, in the ascending colon and in                            the cecum, removed with a cold snare. Resected and                            retrieved.                           - The examination was otherwise normal on direct                            and retroflexion views. Internal hemorrhoids present Recommendation:           - Repeat  colonoscopy is not recommended for                            surveillance.                           - Patient has a contact number available for                            emergencies. The signs and symptoms of potential                            delayed complications were discussed with the                            patient. Return to normal activities tomorrow.                            Written discharge instructions were provided to the                            patient.                           -  Resume previous diet.                           - Continue present medications. RESUME PLAVIX TODAY                           - Await pathology results. Docia Chuck. Henrene Pastor, MD 01/05/2020 2:25:51 PM This report has been signed electronically.

## 2020-01-05 NOTE — Progress Notes (Signed)
Report to PACU, RN, vss, BBS= Clear.  

## 2020-01-05 NOTE — Progress Notes (Signed)
Called to room to assist during endoscopic procedure.  Patient ID and intended procedure confirmed with present staff. Received instructions for my participation in the procedure from the performing physician.  

## 2020-01-05 NOTE — Patient Instructions (Signed)
Handouts provided on Hiatal hernia, post-dilation diet and polyps.   Resume Plavix today at previous dose.   YOU HAD AN ENDOSCOPIC PROCEDURE TODAY AT [facility]:   Refer to the procedure report that was given to you for any specific questions about what was found during the examination.  If the procedure report does not answer your questions, please call your gastroenterologist to clarify.  If you requested that your care partner not be given the details of your procedure findings, then the procedure report has been included in a sealed envelope for you to review at your convenience later.  YOU SHOULD EXPECT: Some feelings of bloating in the abdomen. Passage of more gas than usual.  Walking can help get rid of the air that was put into your GI tract during the procedure and reduce the bloating. If you had a lower endoscopy (such as a colonoscopy or flexible sigmoidoscopy) you may notice spotting of blood in your stool or on the toilet paper. If you underwent a bowel prep for your procedure, you may not have a normal bowel movement for a few days.  Please Note:  You might notice some irritation and congestion in your nose or some drainage.  This is from the oxygen used during your procedure.  There is no need for concern and it should clear up in a day or so.  SYMPTOMS TO REPORT IMMEDIATELY:   Following lower endoscopy (colonoscopy or flexible sigmoidoscopy):  Excessive amounts of blood in the stool  Significant tenderness or worsening of abdominal pains  Swelling of the abdomen that is new, acute  Fever of 100F or higher   Following upper endoscopy (EGD)  Vomiting of blood or coffee ground material  New chest pain or pain under the shoulder blades  Painful or persistently difficult swallowing  New shortness of breath  Fever of 100F or higher  Black, tarry-looking stools  For urgent or emergent issues, a gastroenterologist can be reached at any hour by calling [phone]  [phone]. Do not use MyChart messaging for urgent concerns.    DIET:  Follow Post-dilation diet today (see handout). You may resume your regular diet tomorrow. Drink plenty of fluids but you should avoid alcoholic beverages for 24 hours.  ACTIVITY:  You should plan to take it easy for the rest of today and you should NOT DRIVE or use heavy machinery until tomorrow (because of the sedation medicines used during the test).    FOLLOW UP: Our staff will call the number listed on your records 48-72 hours following your procedure to check on you and address any questions or concerns that you may have regarding the information given to you following your procedure. If we do not reach you, we will leave a message.  We will attempt to reach you two times.  During this call, we will ask if you have developed any symptoms of COVID 19. If you develop any symptoms (ie: fever, flu-like symptoms, shortness of breath, cough etc.) before then, please call [phone].  If you test positive for Covid 19 in the 2 weeks post procedure, please call and report this information to [place].    If any biopsies were taken you will be contacted by phone or by letter within the next 1-3 weeks.  Please call us at [phone] if you have not heard about the biopsies in 3 weeks.    SIGNATURES/CONFIDENTIALITY: You and/or your care partner have signed paperwork which will be entered into your electronic medical  record.  These signatures attest to the fact that that the information above on your After Visit Summary has been reviewed and is understood.  Full responsibility of the confidentiality of this discharge information lies with you and/or your care-partner.

## 2020-01-05 NOTE — Op Note (Signed)
[facility] Patient Name: Alexander Murray Procedure Date: 01/05/2020 1:34 PM MRN: [id number] Endoscopist: Docia Chuck. Henrene Pastor , MD Age: 79 Referring MD:  Date of Birth: Sep 03, 1940 Gender: Male Account #: [id number] Procedure:                Upper GI endoscopy with [facility] dilation of the                            esophagus. 58 [place] Indications:              Dysphagia, Esophageal reflux Medicines:                Monitored Anesthesia Care Procedure:                Pre-Anesthesia Assessment:                           - Prior to the procedure, a History and Physical                            was performed, and patient medications and                            allergies were reviewed. The patient's tolerance of                            previous anesthesia was also reviewed. The risks                            and benefits of the procedure and the sedation                            options and risks were discussed with the patient.                            All questions were answered, and informed consent                            was obtained. Prior Anticoagulants: The patient has                            taken Plavix (clopidogrel), last dose was 6 days                            prior to procedure. ASA Grade Assessment: III - A                            patient with severe systemic disease. After                            reviewing the risks and benefits, the patient was                            deemed in satisfactory condition to undergo the  procedure.                           After obtaining informed consent, the endoscope was                            passed under direct vision. Throughout the                            procedure, the patient's blood pressure, pulse, and                            oxygen saturations were monitored continuously. The                            Endoscope was introduced through the mouth, and              advanced to the second part of duodenum. The upper                            GI endoscopy was accomplished without difficulty.                            The patient tolerated the procedure well. Scope In: Scope Out: Findings:                 In the proximal esophagus at approximately 20 cm                            from the incisors was an esophageal web. This was                            associated with gastric type mucosa or inlet patch.                            No active esophagitis or other abnormality. After                            completing the endoscopic survey the scope was                            withdrawn. Dilation was performed with a Maloney                            dilator with no resistance at 50 Fr.                           The exam of the esophagus was otherwise normal.                           The stomach was normal save a sliding hiatal hernia.                           The examined duodenum was normal.  The cardia and gastric fundus were normal on                            retroflexion. Complications:            No immediate complications. Estimated Blood Loss:     Estimated blood loss: none. Impression:               1. Proximal esophageal web status post dilation                           2. Inlet patch versus heterotropic gastric mucosa                           3. Small hiatal hernia, otherwise unremarkable EGD. Recommendation:           1. Post dilation diet                           2. Continue current medications                           3. Resume Plavix today                           4. GI follow-up as needed. Docia Chuck. Henrene Pastor, MD 01/05/2020 2:33:07 PM This report has been signed electronically.

## 2020-01-09 ENCOUNTER — Telehealth: Payer: Self-pay

## 2020-01-09 NOTE — Telephone Encounter (Signed)
  Follow up Call-  Call back number 01/05/2020  Post procedure Call Back phone  # [phone]  Permission to leave phone message Yes  Some recent data might be hidden     Patient questions:  Do you have a fever, pain , or abdominal swelling? No. Pain Score  0 *  Have you tolerated food without any problems? Yes.    Have you been able to return to your normal activities? Yes.    Do you have any questions about your discharge instructions: Diet   No. Medications  No. Follow up visit  No.  Do you have questions or concerns about your Care? Yes.     Pt. States he had significant coughing immediately post-procedure in recovery. He state he has had a continued cough with mild wheezing, denies SOB or fever, he state he thinks it's improving, but not significantly at this time. I advised patient to call his MD immediately if he develops SOB or fever and that I would immediately route a message to Dr. Henrene Pastor for further advisement. Pt. Verbalizes understanding.  Actions: * If pain score is 4 or above: No action needed, pain <4.  1. Have you developed a fever since your procedure? no  2.   Have you had an respiratory symptoms (SOB or cough) since your procedure? no  3.   Have you tested positive for COVID 19 since your procedure no  4.   Have you had any family members/close contacts diagnosed with the COVID 19 since your procedure?  no   If yes to any of these questions please route to Joylene John, RN and Erenest Rasher, RN

## 2020-01-09 NOTE — Telephone Encounter (Signed)
Called patient back after receiving further advisement from Dr. Henrene Pastor. Informed patient that this should resolve in time and for him to contact his PCP immediately if he develops SOB, fever, or chest pain. Pt. Verbalizes understanding.

## 2020-01-09 NOTE — Telephone Encounter (Signed)
Alexander Murray, Please contact [name] and let him know that I am aware.  He definitely aspirated some of his oral secretions at the end of his endoscopy which has led to the symptom complex.  Generally, it does improve with time.  However, should he develop fever or chest pain, he should alert his PCP.  Thank you for letting me know.

## 2020-01-10 ENCOUNTER — Encounter: Payer: Self-pay | Admitting: Internal Medicine

## 2020-01-12 DIAGNOSIS — H66003 Acute suppurative otitis media without spontaneous rupture of ear drum, bilateral: Secondary | ICD-10-CM | POA: Diagnosis not present

## 2020-01-12 DIAGNOSIS — R05 Cough: Secondary | ICD-10-CM | POA: Diagnosis not present

## 2020-01-12 DIAGNOSIS — Z20822 Contact with and (suspected) exposure to covid-19: Secondary | ICD-10-CM | POA: Diagnosis not present

## 2020-02-01 ENCOUNTER — Other Ambulatory Visit (HOSPITAL_COMMUNITY): Payer: Self-pay | Admitting: Internal Medicine

## 2020-02-01 DIAGNOSIS — R911 Solitary pulmonary nodule: Secondary | ICD-10-CM

## 2020-02-09 ENCOUNTER — Ambulatory Visit (HOSPITAL_COMMUNITY)
Admission: RE | Admit: 2020-02-09 | Discharge: 2020-02-09 | Disposition: A | Discharge status: Home / Self Care | Payer: Medicare Other | Source: Ambulatory Visit | Attending: Internal Medicine | Admitting: Internal Medicine

## 2020-02-09 ENCOUNTER — Other Ambulatory Visit: Payer: Self-pay

## 2020-02-09 DIAGNOSIS — R911 Solitary pulmonary nodule: Secondary | ICD-10-CM | POA: Diagnosis not present

## 2020-02-09 DIAGNOSIS — I2510 Atherosclerotic heart disease of native coronary artery without angina pectoris: Secondary | ICD-10-CM | POA: Insufficient documentation

## 2020-02-09 DIAGNOSIS — R918 Other nonspecific abnormal finding of lung field: Secondary | ICD-10-CM | POA: Diagnosis not present

## 2020-02-09 LAB — GLUCOSE, CAPILLARY: Glucose-Capillary: 93 mg/dL (ref 70–99)

## 2020-02-09 IMAGING — CT NM PET TUM IMG INITIAL (PI) SKULL BASE T - THIGH
1 of 8 series · 1 of 25 positions shown · non-contrast
Comparison: None

CLINICAL DATA: Initial treatment strategy for right lower lobe
pulmonary nodule.

EXAM:
NUCLEAR MEDICINE PET SKULL BASE TO THIGH
TECHNIQUE: 8.7 mCi F-18 FDG was injected intravenously. Full-ring PET imaging
was performed from the skull base to thigh after the radiotracer. CT
data was obtained and used for attenuation correction and anatomic
localization.
Fasting blood glucose: 93 mg/dl

[Series 4: ct sk_thigh 5.0 b31f · axial · 5.0mm · 0.98mm/px · 1 of 222 slices shown]
[im 222/222  brain]
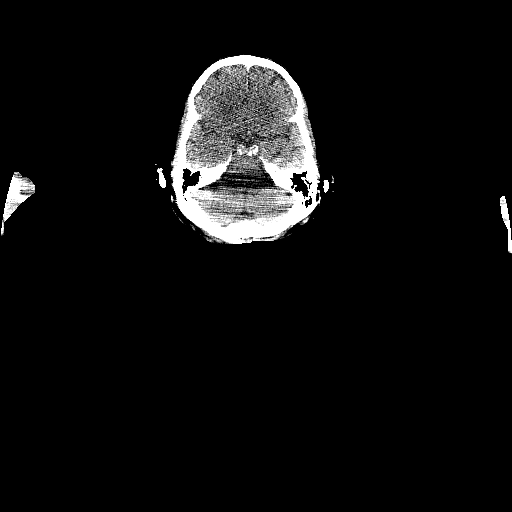

[1 of 25 positions shown; findings below may reference images not displayed]

FINDINGS: Mediastinal blood pool activity: SUV max

Liver activity: SUV max NA

NECK: No hypermetabolic lymph nodes in the neck.

Incidental CT findings: Carotid artery calcifications.

CHEST: The 18 mm right lower lobe pulmonary lesion is
hypermetabolic. SUV max is 7.57. This is consistent with a primary
lung neoplasm.

The 10 mm right lower lobe pulmonary nodule more inferiorly does not
show any hypermetabolism and is likely benign.

No enlarged or hypermetabolic mediastinal or hilar lymph nodes to
suggest metastatic adenopathy.

Incidental CT findings: Emphysematous changes and probable
peripheral interstitial lung disease. Advanced atherosclerotic
calcifications involving the aorta and coronary arteries.

Thick calcification of the pericardium likely the sequela of prior
pericarditis. This could certainly be constrictive.

ABDOMEN/PELVIS: No abnormal hypermetabolic activity within the
liver, pancreas, adrenal glands, or spleen. No hypermetabolic lymph
nodes in the abdomen or pelvis.

Incidental CT findings: Severe/advanced atherosclerotic
calcifications involving the abdominal aorta and iliac arteries
without aneurysm. Simple left renal cyst is noted.

Prostate gland enlargement with median lobe hypertrophy impressing
on the base of the bladder.

SKELETON: No focal hypermetabolic activity to suggest skeletal
metastasis.

Incidental CT findings: none
IMPRESSION: 1. 18 mm superior segment right lower lobe nodule is hypermetabolic
and consistent with primary lung neoplasm.
2. 10 mm right lower lobe nodule more inferiorly is not
hypermetabolic and likely benign.
3. No enlarged or hypermetabolic mediastinal or hilar lymph nodes to
suggest metastatic adenopathy.
4. No findings for abdominal/pelvic metastatic disease or osseous
metastatic disease.
5. Thick calcification of the pericardium likely the sequela of
prior pericarditis.
6. Advanced atherosclerotic calcifications involving the thoracic
and abdominal aorta and branch vessels including three-vessel
coronary artery calcifications.

## 2020-02-09 MED ORDER — FLUDEOXYGLUCOSE F - 18 (FDG) INJECTION
8.7000 | Freq: Once | INTRAVENOUS | Status: AC
  Administered 2020-02-09: 8.7 via INTRAVENOUS

## 2020-02-13 ENCOUNTER — Encounter: Payer: Self-pay | Admitting: *Deleted

## 2020-02-13 NOTE — Progress Notes (Signed)
I received referral form Dr. Buel Ream office.  I later received a vm message for this office to call them.  I called.  Patient has requested biopsy before oncology appt.  Patient has been referred to [facility] and radiology for bx.  Will schedule after that appt.

## 2020-02-14 ENCOUNTER — Other Ambulatory Visit (HOSPITAL_COMMUNITY): Payer: Self-pay | Admitting: Internal Medicine

## 2020-02-14 DIAGNOSIS — R911 Solitary pulmonary nodule: Secondary | ICD-10-CM

## 2020-02-15 ENCOUNTER — Other Ambulatory Visit: Payer: Self-pay | Admitting: *Deleted

## 2020-02-15 ENCOUNTER — Encounter: Payer: Self-pay | Admitting: *Deleted

## 2020-02-15 NOTE — Progress Notes (Signed)
The proposed treatment discussed in cancer conference 02/15/20 is for discussion purpose only and is not a binding recommendation.  The patient was not physically examined nor present for their treatment options.  Therefore, final treatment plans cannot be decided.   Referring office notified of plan

## 2020-02-16 ENCOUNTER — Encounter (HOSPITAL_COMMUNITY): Payer: Self-pay | Admitting: Radiology

## 2020-02-16 ENCOUNTER — Other Ambulatory Visit (HOSPITAL_COMMUNITY): Payer: Self-pay | Admitting: Internal Medicine

## 2020-02-16 ENCOUNTER — Ambulatory Visit
Admission: RE | Admit: 2020-02-16 | Discharge: 2020-02-16 | Disposition: A | Discharge status: Home / Self Care | Payer: Self-pay | Source: Ambulatory Visit | Attending: Internal Medicine | Admitting: Internal Medicine

## 2020-02-16 DIAGNOSIS — R52 Pain, unspecified: Secondary | ICD-10-CM

## 2020-02-16 NOTE — Progress Notes (Signed)
Alexander Murray Male, 79 y.o., [birth year] MRN:  [id number] Phone:  [phone] [name]) PCP:  [name], MD Primary Cvg:  Medicare/Medicare Part A And B Next Appt With Cardiology 02/27/2020 at 7:50 AM  RE: CT Biopsy Received: 2 days ago Valrie Hart, RN  Arne Cleveland, MD Cc: Garth Bigness D Thanks for the update Dr. Vernard Gambles. D       Previous Messages   ----- Message -----  From: Arne Cleveland, MD  Sent: 02/14/2020  2:46 PM EDT  To: Valrie Hart, RN, [name]  Subject: FW: CT Biopsy                   SPN on PET no mets so we'll let thoracic surgeons decide if they just want to wedge it out for the cure.  If not let me know and we'll set up biopsy. Left VM with [name] to discuss that strategy.   Thx  DDH    ----- Message -----  From: Garth Bigness D  Sent: 02/14/2020 12:46 PM EDT  To: [facility] Procedure Requests  Subject: CT Biopsy                     Procedure: CT Lung Biopsy   Reason: Lung nodule   History: NM PET in computer   Provider: [name]   Provider Contact: [phone]

## 2020-02-19 ENCOUNTER — Encounter: Payer: Self-pay | Admitting: *Deleted

## 2020-02-19 NOTE — Progress Notes (Signed)
I followed up on patient's schedule to see [facility].  I did not see an appt so I contacted their office for an update.

## 2020-02-20 ENCOUNTER — Ambulatory Visit
Admission: RE | Admit: 2020-02-20 | Discharge: 2020-02-20 | Disposition: A | Discharge status: Home / Self Care | Payer: Self-pay | Source: Ambulatory Visit | Attending: Internal Medicine | Admitting: Internal Medicine

## 2020-02-20 ENCOUNTER — Institutional Professional Consult (permissible substitution) (INDEPENDENT_AMBULATORY_CARE_PROVIDER_SITE_OTHER): Payer: Medicare Other | Admitting: Cardiothoracic Surgery

## 2020-02-20 ENCOUNTER — Other Ambulatory Visit: Payer: Self-pay | Admitting: *Deleted

## 2020-02-20 ENCOUNTER — Other Ambulatory Visit: Payer: Self-pay

## 2020-02-20 VITALS — BP 133/78 | HR 72 | Temp 97.9°F | Resp 20 | Ht 70.0 in | Wt 160.0 lb

## 2020-02-20 DIAGNOSIS — R911 Solitary pulmonary nodule: Secondary | ICD-10-CM

## 2020-02-20 DIAGNOSIS — R918 Other nonspecific abnormal finding of lung field: Secondary | ICD-10-CM | POA: Diagnosis not present

## 2020-02-20 NOTE — Progress Notes (Signed)
[address]       [place] [address]             [phone]                    Alexander Murray [facility] Medical Record #[id number] Date of Birth: 1940/11/19  Referring: Alexander Battles, MD Primary Care: Alexander Battles, MD Primary Cardiologist: No primary care provider on file.  Chief Complaint:    Chief Complaint  Patient presents with  . Lung Lesion    Surgical consult, PET scan 02/09/20    History of Present Illness:    Alexander Murray 79 y.o. male is seen in the office  today for 1.8 cm right lower lobe lung nodule noted on recent CT scan of the chest done in [facility]. The patient notes that he had a recent endoscopy and thought he had aspirated during this procedure following he had a persistent cough leading to chest x-ray, chest x-ray with nipple markers CT scan of the chest and ultimately a PET scan of the chest done at [facility] long recently to evaluate right lung nodule. The patient's only previous CT scans of the chest was one done primarily for this thoracic spine in August 2018. -Although limited views this lesion was not noted at that time.   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 Caryl Murray after placement of the pacemaker for symptomatic bradycardia myoview 2016 Low risk stress nuclear study with small, mild, fixed anteroseptal and inferior defects (possible soft tissue attenuation); no ischemia. Since pacemaker placement patient denies any cardiac symptoms  He remains physically active, currently living for the  summer in [place]. He is retired Doctor, general practice in [place]   Current Activity/ Functional Status:  Patient is independent with mobility/ambulation, transfers, ADL's, IADL's.   Zubrod Score: At the time of surgery this patient's most appropriate activity status/level should be described as: [x]     0    Normal activity, no symptoms []     1    Restricted in physical strenuous activity but ambulatory, able to do out light work []     2    Ambulatory and capable of self care, unable to do work activities, up and about               >50 % of waking hours                              []     3    Only limited self care, in bed greater than 50% of waking hours []     4    Completely disabled, no self care, confined to bed or chair []     5    Moribund   Past Medical History:  Diagnosis Date  . Allergy   . Arthritis    "right knee and right thumb" (12/19/2014)  . Asthma    pt denies  . Bronchitis 07/2016   started in December and continued about 2 months  .  Esophageal stricture   . GERD (gastroesophageal reflux disease)   . Hemochromatosis   . Hypercalcemia   . Hypercholesteremia   . Hypertension   . Osteoarthritis   . Palpitations   . Presence of permanent cardiac pacemaker    hx bradycardia  . Retinal artery occlusion, branch    "right eye"  . Second degree AV block, Mobitz type II   . Skin cancer    right shoulder  . Stroke [facility]) ~ 2011   "right eye"/ partial blindness    Past Surgical History:  Procedure Laterality Date  . CATARACT EXTRACTION, BILATERAL  01/2017  . COLONOSCOPY    . EP IMPLANTABLE DEVICE N/A 12/19/2014   Procedure: Pacemaker Implant;  Surgeon: Alexander Sprang, MD;  Location: [facility];  Service: Cardiovascular;  Laterality: N/A;  . ESOPHAGOGASTRODUODENOSCOPY (EGD) WITH ESOPHAGEAL DILATION  X 3  . INGUINAL HERNIA REPAIR Left 1980's  . INSERT / REPLACE / REMOVE PACEMAKER  12/19/2014  . KNEE ARTHROSCOPY Right ~ 1982; ~ 1992  . MOHS SURGERY Right ~  2014   "pre-melanoma scapula"  . POSTERIOR TIBIAL TENDON REPAIR Left 2012  . TONSILLECTOMY  1946  . TOTAL KNEE ARTHROPLASTY Right 05/20/2015   Procedure: RIGHT TOTAL KNEE ARTHROPLASTY;  Surgeon: Alexander Cancel, MD;  Location: [facility];  Service: Orthopedics;  Laterality: Right;  . UPPER GASTROINTESTINAL ENDOSCOPY      Family History  Problem Relation Age of Onset  . Colon cancer Mother        dx in her 79s  . Heart disease Mother   . Alzheimer's disease Mother   . Colon cancer Father        dx in his early 24s  . Heart disease Father   . Prostate cancer Father   . Prostate cancer Brother        1/2 brother  . Hemochromatosis Brother   . Prostate cancer Brother   . Esophageal cancer Neg Hx   . Pancreatic cancer Neg Hx   . Stomach cancer Neg Hx   . Liver disease Neg Hx      Social History   Tobacco Use  Smoking Status Former Smoker  . Years: 3.00  . Types: Cigarettes  . Quit date: 05/16/1962  . Years since quitting: 57.8  Smokeless Tobacco Never Used  Tobacco Comment   "quit smoking in the 1960's"; was an intermittent smoker     Social History   Substance and Sexual Activity  Alcohol Use No   Comment: 12/19/2014 "stopped drinking in ~ 2012"     No Known Allergies  Current Outpatient Medications  Medication Sig Dispense Refill  . acetaminophen (TYLENOL) 325 MG tablet Take 2 tablets (650 mg total) by mouth every 6 (six) hours as needed for mild pain or moderate pain.    Marland Kitchen amLODipine (NORVASC) 2.5 MG tablet Take 2.5 mg by mouth at bedtime.    Marland Kitchen aspirin 81 MG tablet Take 81 mg by mouth daily.    . celecoxib (CELEBREX) 200 MG capsule Take 200 mg by mouth 2 (two) times daily as needed for mild pain.    . chlorthalidone (HYGROTON) 25 MG tablet Take 25 mg by mouth every other day.     . clopidogrel (PLAVIX) 75 MG tablet Take 75 mg by mouth every other day.    . doxazosin (CARDURA) 4 MG tablet Take 1 tablet by mouth daily.    Marland Kitchen esomeprazole (NEXIUM) 20 MG packet Take 20  mg by mouth daily before breakfast.    .  Misc Natural Products (GLUCOSAMINE CHONDROITIN ADV PO) Take 1,500 mg by mouth daily.    . montelukast (SINGULAIR) 10 MG tablet Take 10 mg by mouth every morning.     . Multiple Vitamin (MULTIVITAMIN) capsule Take 1 capsule by mouth daily.    . Multiple Vitamins-Minerals (PRESERVISION AREDS 2 PO) Take 1 capsule by mouth daily.    . simvastatin (ZOCOR) 40 MG tablet Take 40 mg by mouth daily.    . vitamin C (ASCORBIC ACID) 500 MG tablet Take 500 mg by mouth daily.      No current facility-administered medications for this visit.   Currently the patient notes that he has not taking his Plavix  Pertinent items are noted in HPI.   Review of Systems:     Cardiac Review of Systems: [Y] = yes  or   [ N ] = no   Chest Pain [  n  ]  Resting SOB [ n  ] Exertional SOB  [y on 4 th flight of stairs   ]  Orthopnea [n  ]   Pedal Edema [  n ]    Palpitations [ n ] Syncope  [n  ]   Presyncope [  n ]   General Review of Systems: [Y] = yes [  ]=no Constitional: recent weight change [ n ];  Wt loss over the last 3 months [   ] anorexia [  ]; fatigue [  ]; nausea [  ]; night sweats [  ]; fever [  ]; or chills [  ];           Eye : blurred vision [  ]; diplopia [   ]; vision changes [  ];  Amaurosis fugax[  ]; Resp: cough [  ];  wheezing[  ];  hemoptysis[  ]; shortness of breath[  ]; paroxysmal nocturnal dyspnea[  ]; dyspnea on exertion[  ]; or orthopnea[  ];  GI:  gallstones[  ], vomiting[  ];  dysphagia[  ]; melena[  ];  hematochezia [  ]; heartburn[  ];   Hx of  Colonoscopy[  ]; GU: kidney stones [  ]; hematuria[  ];   dysuria [  ];  nocturia[  ];  history of     obstruction [  ]; urinary frequency [  ]             Skin: rash, swelling[  ];, hair loss[  ];  peripheral edema[  ];  or itching[  ]; Musculosketetal: myalgias[  ];  joint swelling[  ];  joint erythema[  ];  joint pain[  ];  back pain[  ];  Heme/Lymph: bruising[  ];  bleeding[  ];  anemia[  ];  Neuro: TIA[   ];  headaches[  ];  stroke[  ];  vertigo[  ];  seizures[  ];   paresthesias[  ];  difficulty walking[  ];  Psych:depression[  ]; anxiety[  ];  Endocrine: diabetes[  ];  thyroid dysfunction[  ];  Immunizations: Flu up to date [ y ]; Pneumococcal up to date [ y ]; civid  [ Y]  Other: Patient bothered by postnasal drip in the past history of cataract surgery     PHYSICAL EXAMINATION: BP 133/78 (BP Location: Right Arm, Patient Position: Sitting, Cuff Size: Normal)   Pulse 72   Temp 97.9 F (36.6 C) (Temporal)   Resp 20   Ht 5\' 10"  (1.778 m)   Wt 160 lb (72.6 kg)   SpO2  97% Comment: RA  BMI 22.96 kg/m  General appearance: alert, cooperative, appears stated age and no distress Head: Normocephalic, without obvious abnormality, atraumatic Neck: no adenopathy, no carotid bruit, no JVD, supple, symmetrical, trachea midline and thyroid not enlarged, symmetric, no tenderness/mass/nodules Lymph nodes: Cervical, supraclavicular, and axillary nodes normal. Resp: clear to auscultation bilaterally Cardio: regular rate and rhythm, S1, S2 normal, no murmur, click, rub or gallop GI: soft, non-tender; bowel sounds normal; no masses,  no organomegaly Extremities: extremities normal, atraumatic, no cyanosis or edema Neurologic: Grossly normal  Diagnostic Studies & Laboratory data:     Recent Radiology Findings:   PROCEDURE: CT CHEST WITH CONTRAST  HISTORY: Pulmonary nodule  COMPARISON: Chest radiographs 01/16/2020 and 01/12/2020  TECHNIQUE: Contiguous axial images were obtained from the thoracic inlet through lung bases after the administration of intravenous contrast.  FINDINGS:  The thyroid is slightly atrophic.  No pathologically enlarged supraclavicular, axillary, mediastinal, or hilar lymph nodes are seen.  There is no pleural or pericardial effusion. There are pericardial calcifications consistent with prior pericarditis. The heart is enlarged. There is a left anterior chest wall pacing  device with right atrial andright ventricular leads.  There are mild changes of emphysema. There is septal thickening in both lungs, suggesting early changes of pulmonary fibrosis. No overt honeycombing is present and there is no evidence of traction bronchiectasis.  Thereis a pulmonary nodule in the superior segment of the right lower lobe measuring 1.8 x 1.4 x 1.2 cm. This is concerning for a malignant process. This corresponds in size and location with the finding on recent chest radiographs. There are scattered areasof pleural thickening.  There is limited imaging of the upper abdomen. No acute upper abdominal abnormality is identified. There is a cyst in the medial upper pole of the left kidney.  No acute osseous abnormality is identified. There is a compression fracture with greater than 50% loss of height at T11, stable compared to previous radiographs.  IMPRESSION:  1. There is a pulmonary nodule in the peripheral superior segment of the right lower lobe. This measures up to 1.8 cm and is suspicious for a malignant process. It is amenable to percutaneous CT-guided biopsy. PET CT could also further characterize.  2. No evidence of lymphadenopathy in the thorax.  3. Evidence of mild emphysema with septal thickening and scattered areas of pleural thickening in both lungs concerning for changes of early fibrosis. There is no honeycombing or traction bronchiectasis.  4. Cardiomegaly.  5. 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  6. Smooth curvilinear pericardial calcifications are compatible with the sequelae of previous pericarditis.   Signed (Electronic Signature): 01/22/2020 2:40 PM EDT  Signed By: Tawni Pummel, MD Specimen Collected: 01/22/20 2:31 PM Last Resulted: 01/22/20 2:40 PM  Received From: [facility] PET Image Initial (PI) Skull Base To Thigh  Result Date: 02/11/2020 CLINICAL DATA:  Initial treatment strategy for right lower lobe pulmonary nodule.  EXAM: NUCLEAR MEDICINE PET SKULL BASE TO THIGH TECHNIQUE: 8.7 mCi F-18 FDG was injected intravenously. Full-ring PET imaging was performed from the skull base to thigh after the radiotracer. CT data was obtained and used for attenuation correction and anatomic localization. Fasting blood glucose: 93 mg/dl COMPARISON:  None FINDINGS: Mediastinal blood pool activity: SUV max 2.74 Liver activity: SUV max NA NECK: No hypermetabolic lymph nodes in the neck. Incidental CT findings: Carotid artery calcifications. CHEST: The 18 mm right lower lobe pulmonary lesion is hypermetabolic. SUV max  is 7.57. This is consistent with a primary lung neoplasm. The 10 mm right lower lobe pulmonary nodule more inferiorly does not show any hypermetabolism and is likely benign. No enlarged or hypermetabolic mediastinal or hilar lymph nodes to suggest metastatic adenopathy. Incidental CT findings: Emphysematous changes and probable peripheral interstitial lung disease. Advanced atherosclerotic calcifications involving the aorta and coronary arteries. Thick calcification of the pericardium likely the sequela of prior pericarditis. This could certainly be constrictive. ABDOMEN/PELVIS: No abnormal hypermetabolic activity within the liver, pancreas, adrenal glands, or spleen. No hypermetabolic lymph nodes in the abdomen or pelvis. Incidental CT findings: Severe/advanced atherosclerotic calcifications involving the abdominal aorta and iliac arteries without aneurysm. Simple left renal cyst is noted. Prostate gland enlargement with median lobe hypertrophy impressing on the base of the bladder. SKELETON: No focal hypermetabolic activity to suggest skeletal metastasis. Incidental CT findings: none IMPRESSION: 1. 18 mm superior segment right lower lobe nodule is hypermetabolic and consistent with primary lung neoplasm. 2. 10 mm right lower lobe nodule more inferiorly is not hypermetabolic and likely benign. 3. No enlarged or hypermetabolic  mediastinal or hilar lymph nodes to suggest metastatic adenopathy. 4. No findings for abdominal/pelvic metastatic disease or osseous metastatic disease. 5. Thick calcification of the pericardium likely the sequela of prior pericarditis. 6. Advanced atherosclerotic calcifications involving the thoracic and abdominal aorta and branch vessels including three-vessel coronary artery calcifications. Electronically Signed   By: Marijo Sanes M.D.   On: 02/11/2020 16:05     I have independently reviewed the above radiology studies  and reviewed the findings with the patient.   Recent Lab Findings: Lab Results  Component Value Date   WBC 10.4 05/21/2015   HGB 11.3 (L) 05/21/2015   HCT 34.1 (L) 05/21/2015   PLT 139 (L) 05/21/2015   GLUCOSE 151 (H) 05/21/2015   NA 136 05/21/2015   K 3.9 05/21/2015   CL 103 05/21/2015   CREATININE 0.91 05/21/2015   BUN 15 05/21/2015   CO2 26 05/21/2015   TSH 1.95 12/05/2014   INR 1.02 05/17/2015      Assessment / Plan:   #1  1.8 cm hypermetabolic mass superior segment right lower lobe-in non-smoker-with the right lower lobe lesion being hypermetabolic increasing the likelihood that this may be malignant I discussed with the patient various diagnostic and treatment options. Needle biopsy could be done but if positive or negative we would recommend surgical resection-possibly robotic superior segmentectomy and lymph node dissection.  The patient prefers to avoid biopsy and proceed with primary surgery-we will obtain preoperative pulmonary function studies. He would like to accompany his grandson to starting law school in the first week of August. We will tentatively plan to proceed with surgery August 9.  The risks options and expectations were discussed with him. The typical postoperative course including time of in the hospital, need for chest tubes, possible prolonged air leak were reviewed.    He is currently off his Plavix, will also ask him to cut out  Celebrex temporarily.     I  spent 60 minutes with  the patient face to face and greater then 50% of the time was spent in counseling and coordination of care.    Grace Isaac MD      [address] [place] [address] Office [phone]     02/21/2020 10:28 AM

## 2020-02-20 NOTE — Patient Instructions (Addendum)
Pulmonary Nodule A pulmonary nodule is a small, round growth of tissue in the lung. It is sometimes referred to as a "shadow" or "spot on the lung." Nodules range in size from less than 1/5 of an inch (4 mm) to a little bigger than an inch (30 mm). Pulmonary nodules can be either noncancerous (benign) or cancerous (malignant). Most are noncancerous. Smaller nodules in people who do not smoke and do not have any other risk factors for lung cancer are more likely to be noncancerous. Larger, irregular nodules in people who smoke or who have a strong family history of lung cancer are more likely to be cancerous. What are the causes? This condition may be caused by:  A bacterial, fungal, or viral infection, such as tuberculosis. The infection is usually an old and inactive one.  A noncancerous mass of tissue.  Inflammation from conditions such as rheumatoid arthritis.  Abnormal blood vessels in the lungs.  Cancerous tissue, such as lung cancer or a cancer in another part of the body that has spread to the lung. What are the signs or symptoms? This condition usually does not cause symptoms. If symptoms appear, they are usually related to the underlying cause. For example, if the condition is caused by an infection, you may have a cough or fever. How is this diagnosed? This condition is usually diagnosed with an X-ray or CT scan. To help determine whether a pulmonary nodule is benign or malignant, your health care provider will:  Take your medical history.  Perform a physical exam.  Order tests, including: ? Blood tests. ? A skin test called a tuberculin test. This test is done to check if you have been exposed to the germ that causes tuberculosis. ? Chest X-rays. ? A CT scan. This test shows smaller pulmonary nodules more clearly and with more detail than an X-ray. ? A positron emission tomography (PET) scan. This test is done to check if the nodule is cancerous. During the test, a safe amount  of a radioactive substance is injected into the bloodstream. Then a picture is taken. ? Biopsy. In this test, a tiny piece of the pulmonary nodule is removed and then examined under a microscope. How is this treated? Treatment for this condition depends on whether the pulmonary nodule is malignant or benign as well as your risk of getting cancer.  Noncancerous nodules usually do not need to be treated, but they may need to be monitored with CT scans. If a CT scan shows that the pulmonary nodule got bigger, more tests may be done.  Some nodules need to be removed. If this is the case, you may have a procedure called a thoractomy. During the procedure, your health care provider will make an incision in your chest and remove the part of the lung where the nodule is located. Follow these instructions at home:   Take over-the-counter and prescription medicines only as told by your health care provider.  Do not use any products that contain nicotine or tobacco, such as cigarettes and e-cigarettes. If you need help quitting, ask your health care provider.  Keep all follow-up visits as told by your health care provider. This is important. Contact a health care provider if:  You have trouble breathing when you are active.  You feel sick or unusually tired.  You do not feel like eating.  You lose weight without trying.  You develop chills or night sweats. Get help right away if:  You cannot catch your breath.  You begin wheezing.  You cannot stop coughing.  You cough up blood.  You become dizzy or feel like you are going to faint.  You have sudden chest pain.  You have a fever or persistent symptoms for more than 2-3 days.  You have a fever and your symptoms suddenly get worse. Summary  A pulmonary nodule is a small, round growth of tissue in the lung. Most pulmonary nodules are noncancerous.  This condition is usually diagnosed with an X-ray or CT scan.  Common causes of  pulmonary nodules include infection, inflammation, and noncancerous growths.  Though less common, if a nodule is found to be cancerous, you will need specific diagnostic tests and treatment options as directed by your medical provider.  Treatment for this condition depends on whether the pulmonary nodule is benign or malignant as well as your risk of getting cancer. This information is not intended to replace advice given to you by your health care provider. Make sure you discuss any questions you have with your health care provider. Document Revised: 08/13/2017 Document Reviewed: 08/18/2016 Elsevier Patient Education  2020 [place] Lung cancer is an abnormal growth of cancerous cells that forms a mass (malignant tumor) in a lung. There are several types of lung cancer. The types are based on the appearance of the tumor cells. The two most common types are:  Non-small cell lung cancer. This type of lung cancer is the most common type. Non-small cell lung cancers include squamous cell carcinoma, adenocarcinoma, and large cell carcinoma.  Small cell lung cancer. In this type of lung cancer, abnormal cells are smaller than those of non-small cell lung cancer. Small cell lung cancer gets worse (progresses) faster than non-small cell lung cancer. What are the causes? The most common cause of lung cancer is smoking tobacco. The second most common cause is exposure to a chemical called radon. What increases the risk? You are more likely to develop this condition if:  You smoke tobacco.  You have been exposed to: ? Secondhand tobacco smoke. ? Radon gas. ? Uranium. ? Asbestos. ? Arsenic in drinking water. ? Air pollution.  You have a family or personal history of lung cancer.  You have had lung radiation therapy in the past.  You are older than age 66. What are the signs or symptoms? In the early stages, you may not have any symptoms. As the cancer progresses, symptoms  may include:  A lasting cough, possibly with blood.  Fatigue.  Unexplained weight loss.  Shortness of breath.  Loud breathing (wheezing).  Chest pain.  Loss of appetite. Symptoms of advanced lung cancer include:  Hoarseness.  Bone or joint pain.  Weakness.  Change in the structure of the fingernails (clubbing), so that the nail looks like an upside-down spoon.  Swelling of the face or arms.  Inability to move the face (paralysis).  Drooping eyelids. How is this diagnosed? This condition may be diagnosed based on:  Your symptoms and medical history.  A physical exam.  A chest X-ray.  A CT scan.  Blood tests.  Sputum tests.  Removal of a sample of lung tissue (lung biopsy) for testing. Your cancer will be assessed (staged) to determine how severe it is and how much it has spread (metastasized). How is this treated? Treatment depends on the type and stage of your cancer. Treatment may include one or more of the following:  Surgery to remove as much of the cancer as possible. Lymph  nodes in the area may be removed and tested for cancer as well.  Medicines that kill cancer cells (chemotherapy).  High-energy rays that kill cancer cells (radiation therapy).  Chemotherapy. This treatment uses medicines to destroy cancer cells.  Targeted therapy. This targets specific parts of cancer cells and the area around them to block the growth and spread of the cancer. Targeted therapy can help limit the damage to healthy cells. Follow these instructions at home: Eating and drinking  Some of your treatments might affect your appetite. If you are having problems eating, or if you do not have an appetite, meet with a dietitian.  If you have side effects that affect your appetite, it may help to: ? Eat smaller meals and snacks often. ? Drink high-nutrition and high-calorie shakes or supplements. ? Eat bland and soft foods that are easy to eat. ? Avoid eating foods that  are hot, spicy, or hard to swallow. General instructions   Do not use any products that contain nicotine or tobacco, such as cigarettes and e-cigarettes. If you need help quitting, ask your health care provider.  Do not drink alcohol.  If you are admitted to the hospital, make sure your cancer specialist (oncologist) is aware. Your cancer may affect your treatment for other conditions.  Take over-the-counter and prescription medicines only as told by your health care provider.  Consider joining a support group for people who have been diagnosed with lung cancer.  Work with your health care provider to manage any side effects of treatment.  Keep all follow-up visits as told by your health care provider. This is important. Where to find more information  American Cancer Society: https://www.cancer.[place] ([place]): https://www.cancer.gov Contact a health care provider if you:  Lose weight without trying.  Have a persistent cough and wheezing.  Feel short of breath.  Get tired easily.  Have bone or joint pain.  Have difficulty swallowing.  Notice that your voice is changing or getting hoarse.  Have pain that does not get better with medicine. Get help right away if you:  Cough up blood.  Have new breathing problems.  Have chest pain.  Have a fever.  Have swelling in an ankle, leg, or arm, or the face or neck.  Have paralysis in your face.  Are very confused.  Have a drooping eyelid. Summary  Lung cancer is an abnormal growth of cancerous cells that forms a mass (malignant tumor) in a lung.  There are several types of lung cancer. The types are based on the appearance of the tumor cells. The two most common types are non-small cell and small cell.  The most common cause of lung cancer is smoking tobacco.  Early symptoms include a lasting cough, possibly with blood, and fatigue, unexplained weight loss, and shortness of breath.  After  diagnosis, treatment depends on the type and stage of your cancer. This information is not intended to replace advice given to you by your health care provider. Make sure you discuss any questions you have with your health care provider. Document Revised: 07/02/2017 Document Reviewed: 05/27/2017 Elsevier Patient Education  2020 [place] [facility] Video-assisted thoracic surgery (VATS) is a procedure that allows your surgeon to look inside your chest and perform minor procedures as needed. The thoracic area is between the neck and abdomen. VATS is commonly done to:  Study or diagnose problems in the chest.  Remove a tissue sample (biopsy) to be examined.  Put medicines  directly into the chest.  Remove collections of fluid, pus, or blood.  Remove tumors.  Remove a part of a lung (lobectomy).  Treat some problems with the spine, including: ? Abnormal curves (scoliosis or kyphosis). ? Breaks (fractures). ? Tumors. VATS is done using thoracoscopy. Thoracoscopy is a procedure in which a thin tube with a light and camera on the end (thoracoscope) is inserted through a small incision in the chest wall. The thoracoscope sends images to a video monitor that your surgeon will use to view the inside of your chest. Tell a health care provider about:  Any allergies you have.  All medicines you are taking, including vitamins, herbs, eye drops, creams, and over-the-counter medicines.  Any problems you or family members have had with anesthetic medicines.  Any surgeries you have had.  Any medical conditions you have.  Any blood disorders you have.  Whether you are pregnant or may be pregnant. What are the risks? Generally, this is a safe procedure. However, problems may occur, including:  Infection.  Severe bleeding (hemorrhage).  Allergic reaction to medicines.  Damage to structures or organs in the chest, such as nerves.  Lung infection  (pneumonia).  Inability to complete the procedure. If this is the case, your chest may need to be opened with a large incision (thoracotomy). What happens before the procedure? Medicines  Ask your health care provider about: ? Changing or stopping your regular medicines. This is especially important if you are taking diabetes medicines or blood thinners. ? Taking medicines such as aspirin and ibuprofen. These medicines can thin your blood. Do not take these medicines before your procedure if your health care provider instructs you not to.  You may be given antibiotic medicine to help prevent infection. Staying hydrated Follow instructions from your health care provider about hydration, which may include:  Up to 2 hours before the procedure - you may continue to drink clear liquids, such as water, clear fruit juice, black coffee, and plain tea. Eating and drinking restrictions Follow instructions from your health care provider about eating and drinking, which may include:  8 hours before the procedure - stop eating heavy meals or foods such as meat, fried foods, or fatty foods.  6 hours before the procedure - stop eating light meals or foods, such as toast or cereal.  6 hours before the procedure - stop drinking milk or drinks that contain milk.  2 hours before the procedure - stop drinking clear liquids. General instructions  Ask your health care provider how your surgical site will be marked or identified.  You may be asked to shower with a germ-killing soap.  You may have a blood or urine sample taken.  You may have imaging tests, such as: ? Chest X-ray. ? Electrocardiogram (ECG). ? Ultrasound. ? CT scan.  Do not use any products that contain nicotine or tobacco for as long as possible before your procedure. These include cigarettes and e-cigarettes. If you need help quitting, ask your health care provider.  Plan to have someone take you home from the hospital or  clinic.  If you will be going home right after the procedure, plan to have someone with you for 24 hours. What happens during the procedure?  To lower your risk of infection: ? Your health care team will wash or sanitize their hands. ? Your skin will be washed with soap.  An IV tube will be inserted into one of your veins.  You will be given one or  more of the following: ? A medicine to make you fall asleep (general anesthetic). ? A medicine to help you relax (sedative). ? A medicine that is injected into an area of your body to numb everything below the injection site (regional anesthetic). This is less common for this procedure. It may be given if you are not able to tolerate general anesthetic.  A thin tube (catheter) will be inserted into your bladder and your tube that carries urine out of your body (urethra). The catheter will drain your urine.  1-4 small incisions will be made in your chest. The number of incisions depends on the purpose of your procedure.  The thoracoscope will be inserted into your incision(s) and used to view the inside of your chest.  One of your lungs will be deflated. This makes it easier for your surgeon to see the area.  Other surgical instruments may be inserted through your incision(s) to perform necessary procedures.  After any procedures are done, your lung will be inflated.  A chest tube may be inserted through an incision to drain excess fluid from the surgical area.  Any remaining incisions will be closed with stitches (sutures) or staples.  A bandage (dressing) may be placed over your incision(s). The procedure may vary among health care providers and hospitals. What happens after the procedure?  Your blood pressure, heart rate, breathing rate, and blood oxygen level will be monitored until the medicines you were given have worn off.  You may have a chest tube draining fluid from the surgical area. The chest tube will be closely monitored  for signs of fluid or air buildup in your lungs.  You may continue to receive fluids and medicines through an IV tube.  You may have to wear compression stockings. These stockings help to prevent blood clots and reduce swelling in your legs.  Do not drive for 24 hours if you were given a sedative. Summary  Video-assisted thoracic surgery (VATS) is a procedure that allows your surgeon to look inside your chest to study or diagnose problems in your thoracic area.  VATS is done by inserting a thin tube with a light and camera on the end (thoracoscope) through a small incision in the chest wall.  After the procedure, you may have a chest tube draining fluid from the surgical area. The chest tube will be closely monitored for signs of fluid or air buildup in your lungs. This information is not intended to replace advice given to you by your health care provider. Make sure you discuss any questions you have with your health care provider. Document Revised: 07/02/2017 Document Reviewed: 07/06/2016 Elsevier Patient Education  2020 [place] [facility], Care After This sheet gives you information about how to care for yourself after your procedure. Your doctor may also give you more specific instructions. If you have problems or questions, contact your doctor. What can I expect after the procedure? After the procedure, it is common to have:  Some pain and soreness in your chest.  Pain when you breathe in (inhale) and cough.  Trouble pooping (constipation).  Tiredness (fatigue).  Trouble sleeping. Follow these instructions at home: Preventing lung infection (pneumonia)  Take deep breaths or do breathing exercises as told by your doctor.  Cough often. Coughing is important to clear thick spit (phlegm) and open your lungs.  You can make coughing hurt less if you try supporting (splinting) your chest. Try one of these when you cough: ? Hold  a pillow against  your chest. ? Place both hands flat on top of your cut.  Use an incentive spirometer as told by your doctor. This is a tool that measures how well you can fill your lungs with each breath.  Do lung therapy (pulmonary rehabilitation) as told. Medicines  Take over-the-counter or prescription medicines only as told by your doctor.  If you have pain, take pain-relieving medicine before your pain gets very bad. Doing this will help you breathe and cough more comfortably.  If you were prescribed an antibiotic medicine, take it as told by your doctor. Do not stop taking the antibiotic even if you start to feel better. Activity  Ask your doctor what activities are safe for you.  Avoid activities that use your chest muscles for 3-4 weeks or longer.  Do not lift anything that is heavier than 10 lb (4.5 kg), or the limit that your doctor tells you, until he or she says that it is safe. Cut (incision) care  Follow instructions from your doctor about how to take care of your cut(s) from surgery. Make sure you: ? Wash your hands with soap and water before you change your bandage (dressing). If you cannot use soap and water, use hand sanitizer. ? Change your bandage as told by your doctor. ? Leave stitches (sutures), skin glue, or skin tape (adhesive) strips in place. They may need to stay in place for 2 weeks or longer. If tape strips get loose and curl up, you may trim the loose edges. Do not remove tape strips completely unless your doctor says it is okay.  Keep your bandage dry until it has been removed.  Every day, check the area around your cut(s) for signs of infection. Check for: ? Redness, swelling, or pain. ? Fluid or blood. ? Warmth. ? Pus or a bad smell. Bathing  Do not take baths, swim, or use a hot tub until your doctor approves. You may take showers.  After your bandage is removed, use soap and water to gently wash the your cut(s) from surgery. Do not use anything else to clean  your cut(s) unless your doctor tells you to do this. Driving   Do not drive until your doctor approves.  Do not drive or use heavy machinery while taking prescription pain medicine. Eating and drinking  Eat a healthy diet as told by your doctor. A healthy diet includes: ? Lots of fresh fruits and vegetables. ? Whole grains. ? Low-fat (lean) proteins.  Limit foods that are high in fat and processed sugars. These include fried and sweet foods.  Drink enough fluid to keep your pee (urine) clear or light yellow. General instructions   To prevent or treat trouble pooping while you are taking prescription pain medicine, your doctor may recommend that you: ? Take over-the-counter or prescription medicines. ? Eat foods that have a lot of fiber. These include beans, fresh fruits and vegetables, and whole grains.  Do not use any products that contain nicotine or tobacco. These include cigarettes and e-cigarettes. If you need help quitting, ask your doctor.  Avoid being where people are smoking (avoid secondhand smoke).  Wear compression stockings as told by your doctor. These stockings help you: ? Not get blood clots in your legs. ? Have less swelling in your legs.  If you have a chest tube, care for it as told by your doctor.  Do not travel by airplane during the 2 weeks after your chest tube is removed, or  until your doctor says that this is safe.  Keep all follow-up visits as told by your doctor. This is important. Contact a doctor if:  You have redness, swelling, or pain around a cut from surgery.  You have fluid or blood coming from a cut from surgery.  Your cut(s) from surgery feel warm to the touch.  You have pus or a bad smell coming from a cut from surgery.  You have a fever or chills.  You feel sick to your stomach (nauseous).  You throw up (vomit).  You have pain that does not get better with medicine. Get help right away if:  You have chest pain.  Your  heart is fluttering or beating fast.  You start to have a rash.  You have shortness of breath.  You have trouble breathing.  You are confused.  You have trouble talking or understanding.  You feel weak, light-headed, or dizzy.  You faint. Summary  To help prevent lung infection (pneumonia), take deep breaths or do breathing exercises as told by your doctor.  Cough often. This is important for clearing chest fluid (phlegm). You can make coughing hurt less if you hold a pillow to your chest or put your hands flat on the cut(s) when you cough (do splinting).  Do not drive until your doctor approves.  Every day, check your cut(s) for signs of infection. These signs can be redness, swelling, pain, fluid, blood, warmth, pus, or a bad smell.  Eat a healthy diet. This includes lots of fresh fruits and vegetables, whole grains, and low-fat (lean) proteins. This information is not intended to replace advice given to you by your health care provider. Make sure you discuss any questions you have with your health care provider. Document Revised: 07/02/2017 Document Reviewed: 06/29/2016 Elsevier Patient Education  2020 [employer].

## 2020-02-21 ENCOUNTER — Encounter: Payer: Self-pay | Admitting: *Deleted

## 2020-02-22 ENCOUNTER — Other Ambulatory Visit (HOSPITAL_COMMUNITY): Payer: Medicare Other

## 2020-02-22 ENCOUNTER — Other Ambulatory Visit (HOSPITAL_COMMUNITY)
Admission: RE | Admit: 2020-02-22 | Discharge: 2020-02-22 | Disposition: A | Discharge status: Home / Self Care | Payer: Medicare Other | Source: Ambulatory Visit | Attending: Cardiothoracic Surgery | Admitting: Cardiothoracic Surgery

## 2020-02-22 DIAGNOSIS — Z20822 Contact with and (suspected) exposure to covid-19: Secondary | ICD-10-CM | POA: Insufficient documentation

## 2020-02-22 DIAGNOSIS — R911 Solitary pulmonary nodule: Secondary | ICD-10-CM | POA: Diagnosis not present

## 2020-02-22 DIAGNOSIS — Z01812 Encounter for preprocedural laboratory examination: Secondary | ICD-10-CM | POA: Insufficient documentation

## 2020-02-22 LAB — SARS CORONAVIRUS 2 (TAT 6-24 HRS): SARS Coronavirus 2: NEGATIVE

## 2020-02-23 ENCOUNTER — Ambulatory Visit (HOSPITAL_COMMUNITY)
Admission: RE | Admit: 2020-02-23 | Discharge: 2020-02-23 | Disposition: A | Discharge status: Home / Self Care | Payer: Medicare Other | Source: Ambulatory Visit | Attending: Cardiothoracic Surgery | Admitting: Cardiothoracic Surgery

## 2020-02-23 ENCOUNTER — Other Ambulatory Visit: Payer: Self-pay

## 2020-02-23 DIAGNOSIS — R911 Solitary pulmonary nodule: Secondary | ICD-10-CM | POA: Insufficient documentation

## 2020-02-23 DIAGNOSIS — J849 Interstitial pulmonary disease, unspecified: Secondary | ICD-10-CM | POA: Diagnosis not present

## 2020-02-23 LAB — PULMONARY FUNCTION TEST
DL/VA % pred: 79 %
DL/VA: 3.1 ml/min/mmHg/L
DLCO unc % pred: 58 %
DLCO unc: 14.25 ml/min/mmHg
FEF 25-75 Post: 3.7 L/sec
FEF 25-75 Pre: 3.18 L/sec
FEF2575-%Change-Post: 16 %
FEF2575-%Pred-Post: 191 %
FEF2575-%Pred-Pre: 164 %
FEV1-%Change-Post: 2 %
FEV1-%Pred-Post: 95 %
FEV1-%Pred-Pre: 93 %
FEV1-Post: 2.7 L
FEV1-Pre: 2.63 L
FEV1FVC-%Change-Post: 4 %
FEV1FVC-%Pred-Pre: 120 %
FEV6-%Change-Post: -1 %
FEV6-%Pred-Post: 81 %
FEV6-%Pred-Pre: 82 %
FEV6-Post: 3.02 L
FEV6-Pre: 3.06 L
FEV6FVC-%Change-Post: 0 %
FEV6FVC-%Pred-Post: 107 %
FEV6FVC-%Pred-Pre: 107 %
FVC-%Change-Post: -1 %
FVC-%Pred-Post: 75 %
FVC-%Pred-Pre: 76 %
FVC-Post: 3.02 L
FVC-Pre: 3.07 L
Post FEV1/FVC ratio: 89 %
Post FEV6/FVC ratio: 100 %
Pre FEV1/FVC ratio: 86 %
Pre FEV6/FVC Ratio: 100 %
RV % pred: 75 %
RV: 2.03 L
TLC % pred: 72 %
TLC: 5.12 L

## 2020-02-23 MED ORDER — ALBUTEROL SULFATE (2.5 MG/3ML) 0.083% IN NEBU
2.5000 mg | INHALATION_SOLUTION | Freq: Once | RESPIRATORY_TRACT | Status: AC
  Administered 2020-02-23: 2.5 mg via RESPIRATORY_TRACT

## 2020-02-26 ENCOUNTER — Encounter: Payer: Self-pay | Admitting: *Deleted

## 2020-02-27 ENCOUNTER — Ambulatory Visit (INDEPENDENT_AMBULATORY_CARE_PROVIDER_SITE_OTHER): Payer: Medicare Other | Admitting: *Deleted

## 2020-02-27 DIAGNOSIS — I441 Atrioventricular block, second degree: Secondary | ICD-10-CM | POA: Diagnosis not present

## 2020-02-28 LAB — CUP PACEART REMOTE DEVICE CHECK
Battery Remaining Longevity: 46 mo
Battery Voltage: 2.98 V
Brady Statistic AP VP Percent: 69.75 %
Brady Statistic AP VS Percent: 0.01 %
Brady Statistic AS VP Percent: 30.17 %
Brady Statistic AS VS Percent: 0.07 %
Brady Statistic RA Percent Paced: 69.6 %
Brady Statistic RV Percent Paced: 99.87 %
Date Time Interrogation Session: 20210728104408
Implantable Lead Implant Date: 20160518
Implantable Lead Implant Date: 20160518
Implantable Lead Location: 753859
Implantable Lead Location: 753860
Implantable Lead Model: 5076
Implantable Lead Model: 5076
Implantable Pulse Generator Implant Date: 20160518
Lead Channel Impedance Value: 342 Ohm
Lead Channel Impedance Value: 342 Ohm
Lead Channel Impedance Value: 418 Ohm
Lead Channel Impedance Value: 475 Ohm
Lead Channel Pacing Threshold Amplitude: 0.5 V
Lead Channel Pacing Threshold Amplitude: 0.625 V
Lead Channel Pacing Threshold Pulse Width: 0.4 ms
Lead Channel Pacing Threshold Pulse Width: 0.4 ms
Lead Channel Sensing Intrinsic Amplitude: 3.25 mV
Lead Channel Sensing Intrinsic Amplitude: 3.25 mV
Lead Channel Sensing Intrinsic Amplitude: 5.625 mV
Lead Channel Sensing Intrinsic Amplitude: 5.625 mV
Lead Channel Setting Pacing Amplitude: 1.5 V
Lead Channel Setting Pacing Amplitude: 2.5 V
Lead Channel Setting Pacing Pulse Width: 0.4 ms
Lead Channel Setting Sensing Sensitivity: 4 mV

## 2020-03-01 NOTE — Progress Notes (Signed)
Remote pacemaker transmission.   

## 2020-03-08 ENCOUNTER — Other Ambulatory Visit: Payer: Self-pay

## 2020-03-08 ENCOUNTER — Encounter: Payer: Self-pay | Admitting: Internal Medicine

## 2020-03-08 ENCOUNTER — Encounter (HOSPITAL_COMMUNITY): Payer: Self-pay

## 2020-03-08 ENCOUNTER — Ambulatory Visit (HOSPITAL_COMMUNITY)
Admission: RE | Admit: 2020-03-08 | Discharge: 2020-03-08 | Disposition: A | Discharge status: Home / Self Care | Payer: Medicare Other | Source: Ambulatory Visit | Attending: Cardiothoracic Surgery | Admitting: Cardiothoracic Surgery

## 2020-03-08 ENCOUNTER — Encounter (HOSPITAL_COMMUNITY)
Admission: RE | Admit: 2020-03-08 | Discharge: 2020-03-08 | Disposition: A | Discharge status: Home / Self Care | Payer: Medicare Other | Source: Ambulatory Visit | Attending: Cardiothoracic Surgery | Admitting: Cardiothoracic Surgery

## 2020-03-08 ENCOUNTER — Other Ambulatory Visit: Payer: Self-pay | Admitting: *Deleted

## 2020-03-08 ENCOUNTER — Other Ambulatory Visit (HOSPITAL_COMMUNITY)
Admission: RE | Admit: 2020-03-08 | Discharge: 2020-03-08 | Disposition: A | Discharge status: Home / Self Care | Payer: Medicare Other | Source: Ambulatory Visit | Attending: Cardiothoracic Surgery | Admitting: Cardiothoracic Surgery

## 2020-03-08 DIAGNOSIS — I10 Essential (primary) hypertension: Secondary | ICD-10-CM | POA: Diagnosis not present

## 2020-03-08 DIAGNOSIS — I517 Cardiomegaly: Secondary | ICD-10-CM | POA: Diagnosis not present

## 2020-03-08 DIAGNOSIS — E7800 Pure hypercholesterolemia, unspecified: Secondary | ICD-10-CM | POA: Diagnosis not present

## 2020-03-08 DIAGNOSIS — R9431 Abnormal electrocardiogram [ECG] [EKG]: Secondary | ICD-10-CM | POA: Diagnosis not present

## 2020-03-08 DIAGNOSIS — R911 Solitary pulmonary nodule: Secondary | ICD-10-CM | POA: Insufficient documentation

## 2020-03-08 DIAGNOSIS — Z950 Presence of cardiac pacemaker: Secondary | ICD-10-CM | POA: Diagnosis not present

## 2020-03-08 DIAGNOSIS — Z01812 Encounter for preprocedural laboratory examination: Secondary | ICD-10-CM | POA: Insufficient documentation

## 2020-03-08 DIAGNOSIS — Z86010 Personal history of colonic polyps: Secondary | ICD-10-CM | POA: Diagnosis not present

## 2020-03-08 DIAGNOSIS — Z87891 Personal history of nicotine dependence: Secondary | ICD-10-CM | POA: Diagnosis not present

## 2020-03-08 DIAGNOSIS — Z8673 Personal history of transient ischemic attack (TIA), and cerebral infarction without residual deficits: Secondary | ICD-10-CM | POA: Diagnosis not present

## 2020-03-08 DIAGNOSIS — Z85820 Personal history of malignant melanoma of skin: Secondary | ICD-10-CM | POA: Diagnosis not present

## 2020-03-08 DIAGNOSIS — Z20822 Contact with and (suspected) exposure to covid-19: Secondary | ICD-10-CM | POA: Diagnosis not present

## 2020-03-08 DIAGNOSIS — Z01818 Encounter for other preprocedural examination: Secondary | ICD-10-CM | POA: Diagnosis not present

## 2020-03-08 DIAGNOSIS — C3431 Malignant neoplasm of lower lobe, right bronchus or lung: Secondary | ICD-10-CM | POA: Diagnosis not present

## 2020-03-08 DIAGNOSIS — K219 Gastro-esophageal reflux disease without esophagitis: Secondary | ICD-10-CM | POA: Diagnosis not present

## 2020-03-08 DIAGNOSIS — J45909 Unspecified asthma, uncomplicated: Secondary | ICD-10-CM | POA: Diagnosis not present

## 2020-03-08 LAB — CBC
HCT: 43 % (ref 39.0–52.0)
Hemoglobin: 14 g/dL (ref 13.0–17.0)
MCH: 30 pg (ref 26.0–34.0)
MCHC: 32.6 g/dL (ref 30.0–36.0)
MCV: 92.1 fL (ref 80.0–100.0)
Platelets: UNDETERMINED 10*3/uL (ref 150–400)
RBC: 4.67 MIL/uL (ref 4.22–5.81)
RDW: 14.3 % (ref 11.5–15.5)
WBC: 5.5 10*3/uL (ref 4.0–10.5)
nRBC: 0 % (ref 0.0–0.2)

## 2020-03-08 LAB — URINALYSIS, ROUTINE W REFLEX MICROSCOPIC
Bilirubin Urine: NEGATIVE
Glucose, UA: NEGATIVE mg/dL
Hgb urine dipstick: NEGATIVE
Ketones, ur: NEGATIVE mg/dL
Leukocytes,Ua: NEGATIVE
Nitrite: NEGATIVE
Protein, ur: NEGATIVE mg/dL
Specific Gravity, Urine: 1.011 (ref 1.005–1.030)
pH: 7 (ref 5.0–8.0)

## 2020-03-08 LAB — COMPREHENSIVE METABOLIC PANEL
ALT: 23 U/L (ref 0–44)
AST: 31 U/L (ref 15–41)
Albumin: 4.1 g/dL (ref 3.5–5.0)
Alkaline Phosphatase: 131 U/L — ABNORMAL HIGH (ref 38–126)
Anion gap: 9 (ref 5–15)
BUN: 11 mg/dL (ref 8–23)
CO2: 23 mmol/L (ref 22–32)
Calcium: 10.5 mg/dL — ABNORMAL HIGH (ref 8.9–10.3)
Chloride: 107 mmol/L (ref 98–111)
Creatinine, Ser: 0.87 mg/dL (ref 0.61–1.24)
GFR calc Af Amer: >60 mL/min (ref >60)
GFR calc non Af Amer: >60 mL/min (ref >60)
Glucose, Bld: 90 mg/dL (ref 70–99)
Potassium: 4 mmol/L (ref 3.5–5.1)
Sodium: 139 mmol/L (ref 135–145)
Total Bilirubin: 1.3 mg/dL — ABNORMAL HIGH (ref 0.3–1.2)
Total Protein: 7.1 g/dL (ref 6.5–8.1)

## 2020-03-08 LAB — SARS CORONAVIRUS 2 (TAT 6-24 HRS): SARS Coronavirus 2: NEGATIVE

## 2020-03-08 LAB — BLOOD GAS, ARTERIAL
Acid-base deficit: 0.4 mmol/L (ref 0.0–2.0)
Bicarbonate: 23.4 mmol/L (ref 20.0–28.0)
FIO2: 21
O2 Saturation: 98.4 %
Patient temperature: 37
pCO2 arterial: 36.4 mmHg (ref 32.0–48.0)
pH, Arterial: 7.425 (ref 7.350–7.450)
pO2, Arterial: 116 mmHg — ABNORMAL HIGH (ref 83.0–108.0)

## 2020-03-08 LAB — SURGICAL PCR SCREEN
MRSA, PCR: NEGATIVE
Staphylococcus aureus: NEGATIVE

## 2020-03-08 LAB — APTT: aPTT: >200 seconds (ref 24–36)

## 2020-03-08 LAB — PROTIME-INR
INR: 1.4 — ABNORMAL HIGH (ref 0.8–1.2)
Prothrombin Time: 17.1 seconds — ABNORMAL HIGH (ref 11.4–15.2)

## 2020-03-08 IMAGING — CR DG CHEST 2V
2 series · 2 of 2 positions shown · non-contrast
Comparison: PET CT [DATE], radiograph [DATE]

CLINICAL DATA: Preop thoracoscopy bronchoscopy right lung nodule

EXAM:
CHEST - 2 VIEW

[w chest pa]
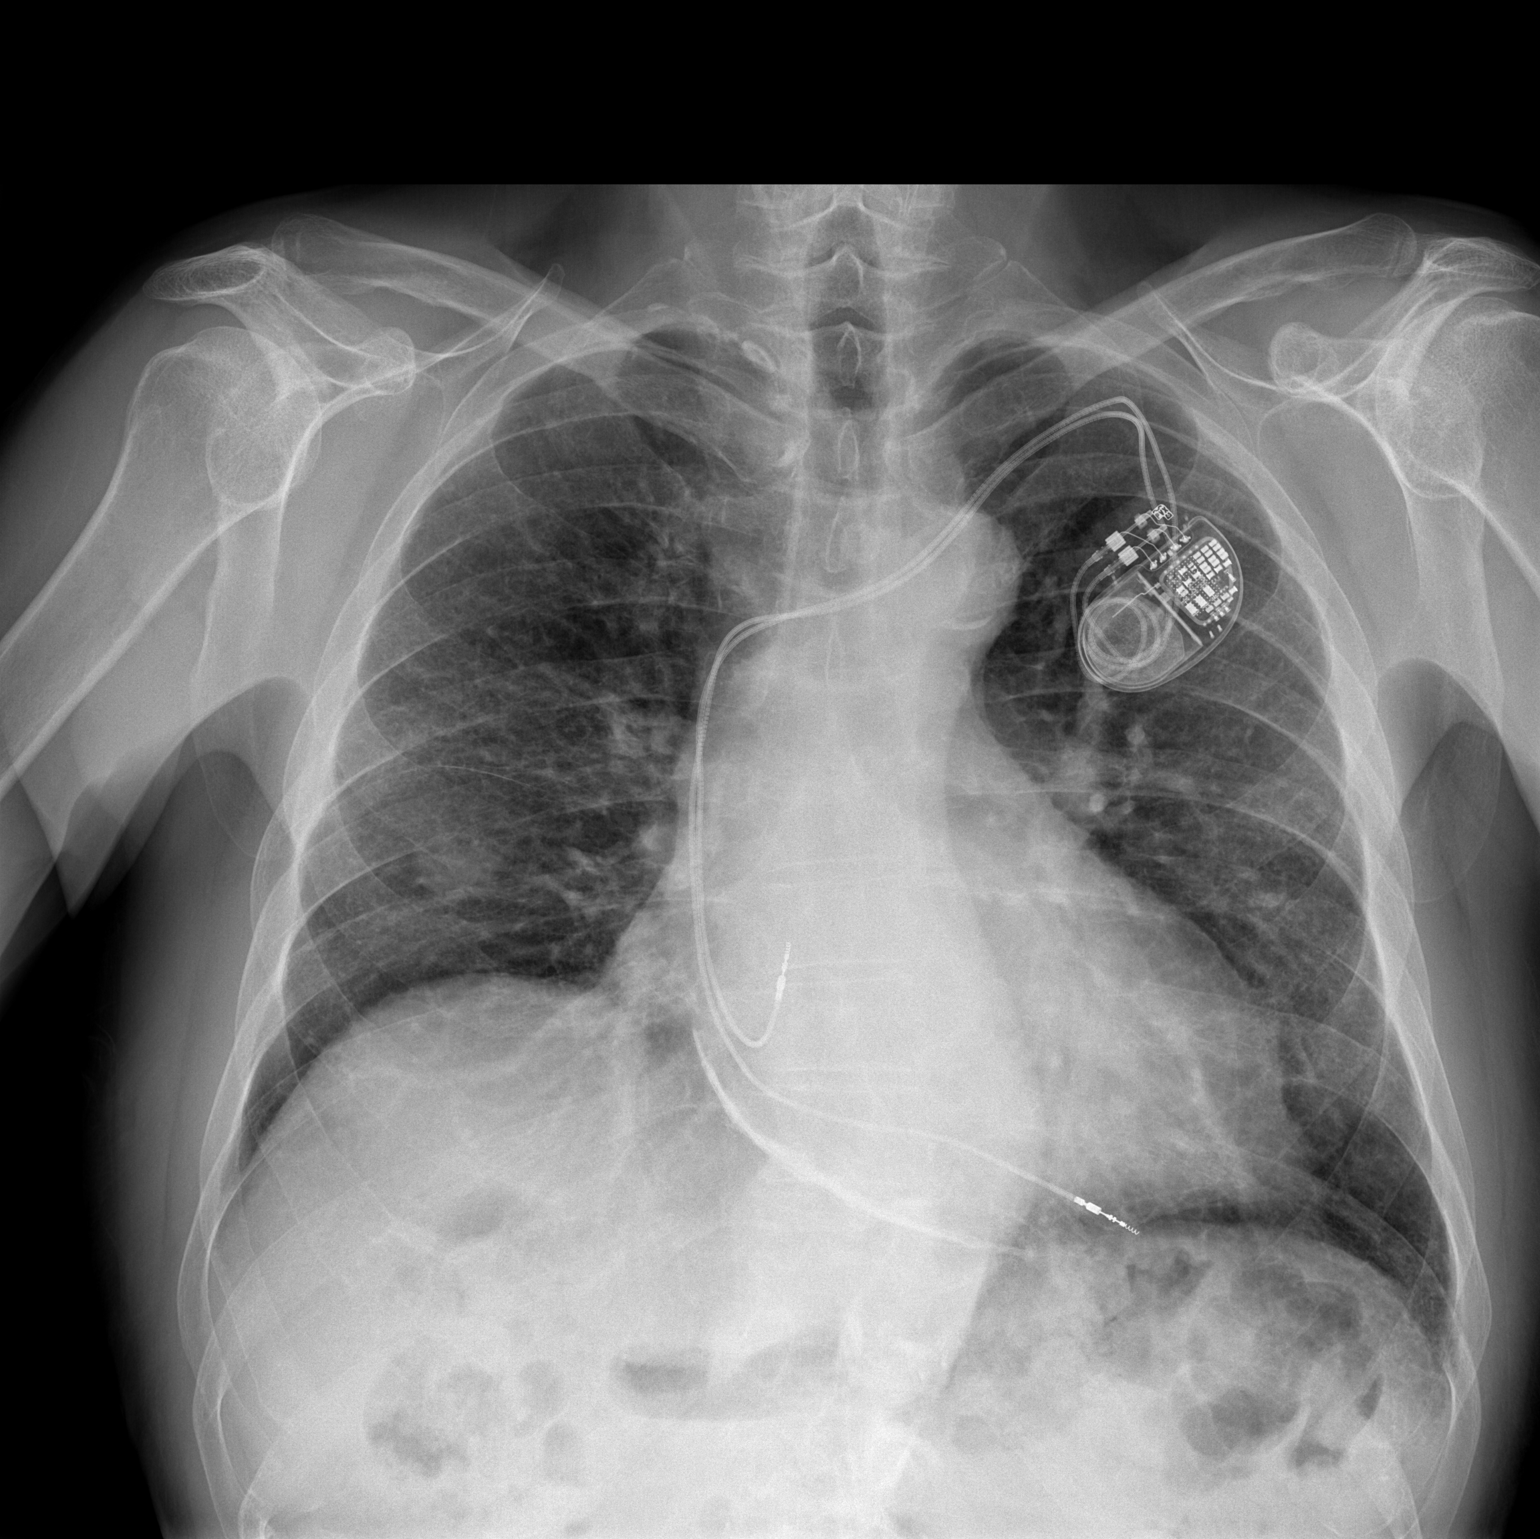

[w chest lat]
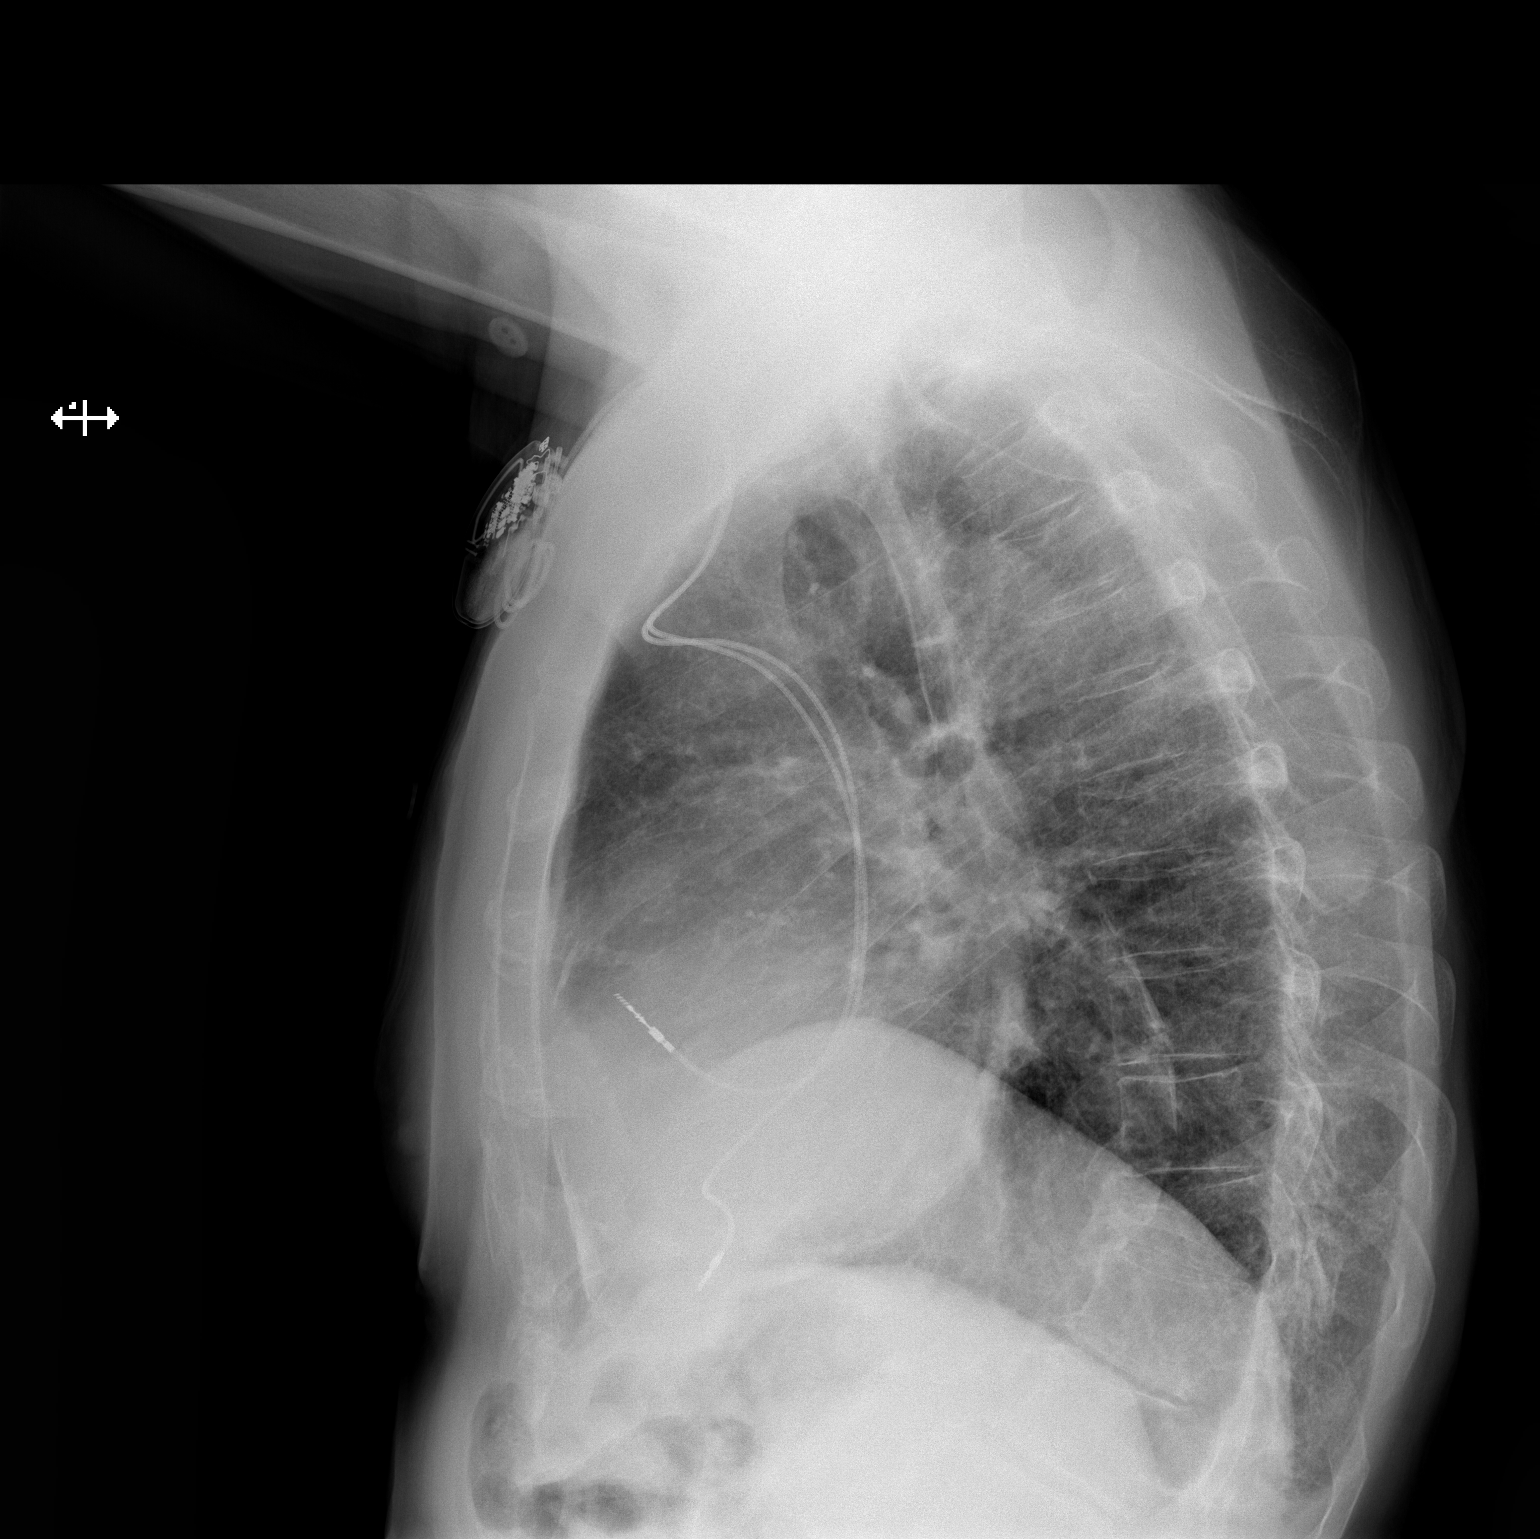

[2 of 2 positions shown; findings below may reference images not displayed]

FINDINGS: Left-sided pacing device with leads over right atrium and right
ventricle. Mild cardiomegaly. Crescent shaped calcification over the
base of the heart consistent with pericardial effusion and likely
related to prior pericarditis. Aortic atherosclerosis. No
pneumothorax. Vague opacity in the right lower lung may correspond
to the lung nodule. Severe chronic compression fracture at T11.
IMPRESSION: 1. Cardiomegaly without edema or acute airspace disease. Pericardial
calcification probably related to prior pericarditis.
2. Vague opacity in the right lower lung may correspond to lung
nodule seen on CT.

## 2020-03-08 NOTE — Anesthesia Preprocedure Evaluation (Addendum)
Anesthesia Evaluation  Patient identified by MRN, date of birth, ID band Patient awake    Reviewed: Allergy & Precautions, NPO status , Patient's Chart, lab work & pertinent test results  History of Anesthesia Complications Negative for: history of anesthetic complications  Airway Mallampati: III  TM Distance: >3 FB Neck ROM: Full    Dental  (+) Dental Advisory Given   Pulmonary asthma , neg sleep apnea, neg recent URI, former smoker,  PULMONARY NODULE   breath sounds clear to auscultation       Cardiovascular hypertension, Pt. on medications (-) angina(-) Past MI and (-) CHF + dysrhythmias + pacemaker  Rhythm:Regular     Neuro/Psych CVA negative psych ROS   GI/Hepatic Neg liver ROS, GERD  ,  Endo/Other  negative endocrine ROS  Renal/GU negative Renal ROS     Musculoskeletal  (+) Arthritis ,   Abdominal   Peds  Hematology negative hematology ROS (+)   Anesthesia Other Findings   Reproductive/Obstetrics                            Anesthesia Physical Anesthesia Plan  ASA: III  Anesthesia Plan: General   Post-op Pain Management:    Induction: Intravenous  PONV Risk Score and Plan: 2 and Ondansetron and Dexamethasone  Airway Management Planned: Double Lumen EBT  Additional Equipment: Arterial line  Intra-op Plan:   Post-operative Plan:   Informed Consent: I have reviewed the patients History and Physical, chart, labs and discussed the procedure including the risks, benefits and alternatives for the proposed anesthesia with the patient or authorized representative who has indicated his/her understanding and acceptance.     Dental advisory given  Plan Discussed with: CRNA and Surgeon  Anesthesia Plan Comments: (PAT note written 03/08/2020 by Myra Gianotti, PA-C. Has a Medtronic PPM. For repeat PT/PTT and PLT count on the day of surgery. He is a retired Doctor, general practice.  )        Anesthesia Quick Evaluation

## 2020-03-08 NOTE — Progress Notes (Signed)
Anesthesia Chart Review:  Case: [id number] Date/Time: 03/11/20 0715   Procedures:      XI ROBOTIC ASSISTED THORACOSCOPY-SEGMENTECTOMY (Right Chest)     VIDEO BRONCHOSCOPY (N/A )   Anesthesia type: General   Pre-op diagnosis: PULMONARY NODULE   Location: [facility] OR ROOM 10 / [place] [facility]   Surgeons: Grace Isaac, MD      DISCUSSION: Patient is an 79 year old male scheduled for the above procedure. He aspirated after 01/05/20 endoscopy and developed a persistent cough leading to imaging with ultimately diagnosis of hypermetabolic RLL lung nodule. He was evaluated by Dr. Servando Snare with the above procedure recommended.   He is a retired Doctor, general practice.   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   Zubrod Score 0 (normal activity, no symptoms) per Dr. Servando Snare.  Last evaluated by Dr. Caryl Comes 05/09/19. Medtronic PPM interrogation 02/28/20: Normal device function.   Reported last Plavix 02/26/20 and last ASA 03/06/20.   03/08/20 presurgical COVID-19 test in process. He is for repeat PT/PTT and PLT count on the day of surgery (see LABS below).  03/08/2020 chest x-ray is still in process.  Anesthesia team to evaluate on the day of surgery.   VS: BP 139/89   Pulse 77   Temp 36.6 C (Oral)   Resp 17   Ht 5\' 10"  (1.778 m)   Wt 72.8 kg   SpO2 99%   BMI 23.03 kg/m   PROVIDERS: [name], MD is PCP  [name], MD is EP cardiologist. Last visit 05/09/19.  Note mentions A. fib/flutter less than 12 hours.  No anticoagulation felt indicated at that time.  No changes made on PPM  programming.   LABS: Preoperative labs noted. PTT > 200, erroneous result suspected. Dr. Servando Snare aware with plan for STAT PTT on the day of surgery. Will also repeat PT/INR since mildly elevated and repeat PLT count since clumped on 03/08/20 labs.  (all labs ordered are listed, but only abnormal results are displayed)  Labs Reviewed  APTT - Abnormal; Notable for the following components:      Result Value   aPTT >200 (*)    All other components within normal limits  BLOOD GAS, ARTERIAL - Abnormal; Notable for the following components:   pO2, Arterial 116 (*)    Allens test (pass/fail) BRACHIAL ARTERY (*)    All other components within normal limits  COMPREHENSIVE METABOLIC PANEL - Abnormal; Notable for the following components:   Calcium 10.5 (*)    Alkaline Phosphatase 131 (*)    Total Bilirubin 1.3 (*)    All other components within normal limits  PROTIME-INR - Abnormal; Notable for the following components:   Prothrombin Time 17.1 (*)    INR 1.4 (*)    All other components within normal limits  SURGICAL PCR SCREEN  CBC  URINALYSIS, ROUTINE W REFLEX MICROSCOPIC  TYPE AND SCREEN    PFTs 02/23/20: FVC 3.07 (76%), post 3.02 (75%). FEV1 2.63 (93%), post 2.70 (95%). DLCO unc 14.25 (58%).   IMAGES: CXR 03/08/20: In process.  PET Scan 02/09/20: IMPRESSION: 1. 18 mm superior segment right lower lobe nodule is hypermetabolic and consistent with primary lung neoplasm. 2. 10 mm right  lower lobe nodule more inferiorly is not hypermetabolic and likely benign. 3. No enlarged or hypermetabolic mediastinal or hilar lymph nodes to suggest metastatic adenopathy. 4. No findings for abdominal/pelvic metastatic disease or osseous metastatic disease. 5. Thick calcification of the pericardium likely the sequela of prior pericarditis. 6. Advanced atherosclerotic calcifications involving the thoracic and abdominal aorta and branch vessels including three-vessel coronary artery  calcifications.  CT Chest 01/22/20 [facility]): Impression: 1. There is a pulmonary nodule in the peripheral superior segment of the right lower lobe. This measures up to 1.8 cm and is suspicious for a malignant process. It is amenable to percutaneous CT-guided biopsy. PET CT could also further characterize.  2. No evidence of lymphadenopathy in the thorax.  3. Evidence of mild emphysema with septal thickening and scattered areas of pleural thickening in both lungs concerning for changes of early fibrosis. There is no honeycombing or traction bronchiectasis.  4. Cardiomegaly.  5. 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  6. Smooth curvilinear pericardial calcifications are compatible with the sequelae of previous pericarditis.    EKG: 03/08/2020: AV dual paced rhythm  CV:  S/p Implantation of a MDT dual chamber PPM on 12/19/14 by Dr Caryl Comes  Echo 12/10/14: Study Conclusions  - Left ventricle: The cavity size was normal. Wall thickness was  normal. Systolic function was normal. The estimated ejection  fraction was in the range of 55% to 60%. Wall motion was normal;  there were no regional wall motion abnormalities.  - Ascending aorta: The ascending aorta was mildly dilated.  - Mitral valve: Calcified annulus.  - Left atrium: The atrium was moderately dilated.  - Pulmonary arteries: PA peak pressure: 40 mm Hg (S).  Impressions:  - Second degree AV block is seen during the study.   Nuclear stress test 11/26/14: Overall Impression:  Low risk stress nuclear study with small, mild, fixed anteroseptal and inferior defects (possible soft tissue attenuation); no ischemia; note patient in type 1 second degree AV block on baseline ECG; HR increased to 127 with exercise; Mobitz 1, 2:1 AV block and occasional junctional beats in recovery; patient seen by Dr Harrington Challenger prior to leaving the office. LV Ejection Fraction: 59%.  LV Wall Motion:  NL LV Function; NL Wall Motion   Past Medical  History:  Diagnosis Date  . Allergy   . Arthritis    "right knee and right thumb" (12/19/2014)  . Asthma    pt denies  . Bronchitis 07/2016   started in December and continued about 2 months  . Esophageal stricture   . GERD (gastroesophageal reflux disease)   . Hemochromatosis   . Hypercalcemia   . Hypercholesteremia   . Hypertension   . Osteoarthritis   . Palpitations   . Presence of permanent cardiac pacemaker    hx bradycardia  . Retinal artery occlusion, branch    "right eye"  . Second degree AV block, Mobitz type II   . Skin cancer    right shoulder  . Stroke [facility]) ~ 2011   "right eye"/ partial blindness    Past Surgical History:  Procedure Laterality Date  . CATARACT EXTRACTION, BILATERAL  01/2017  . COLONOSCOPY    . EP IMPLANTABLE DEVICE N/A 12/19/2014   Procedure: Pacemaker Implant;  Surgeon: Deboraha Sprang, MD;  Location: [place] [facility];  Service: Cardiovascular;  Laterality: N/A;  . ESOPHAGOGASTRODUODENOSCOPY (EGD) WITH ESOPHAGEAL DILATION  X 3  . EYE SURGERY Bilateral 2017  . INGUINAL HERNIA REPAIR Left 1980's  .  INSERT / REPLACE / REMOVE PACEMAKER  12/19/2014  . JOINT REPLACEMENT Right 2016  . KNEE ARTHROSCOPY Right ~ 1982; ~ 1992  . MOHS SURGERY Right ~ 2014   "pre-melanoma scapula"  . POSTERIOR TIBIAL TENDON REPAIR Left 2012  . TONSILLECTOMY  1946  . TOTAL KNEE ARTHROPLASTY Right 05/20/2015   Procedure: RIGHT TOTAL KNEE ARTHROPLASTY;  Surgeon: Paralee Cancel, MD;  Location: [facility];  Service: Orthopedics;  Laterality: Right;  . UPPER GASTROINTESTINAL ENDOSCOPY      MEDICATIONS: . acetaminophen (TYLENOL) 325 MG tablet  . amLODipine (NORVASC) 2.5 MG tablet  . aspirin 81 MG tablet  . celecoxib (CELEBREX) 200 MG capsule  . chlorthalidone (HYGROTON) 25 MG tablet  . clopidogrel (PLAVIX) 75 MG tablet  . doxazosin (CARDURA) 4 MG tablet  . esomeprazole (NEXIUM) 20 MG packet  . Misc Natural Products (GLUCOSAMINE CHONDROITIN ADV PO)  . montelukast  (SINGULAIR) 10 MG tablet  . Multiple Vitamin (MULTIVITAMIN) capsule  . Multiple Vitamins-Minerals (PRESERVISION AREDS 2 PO)  . simvastatin (ZOCOR) 40 MG tablet  . vitamin C (ASCORBIC ACID) 500 MG tablet   No current facility-administered medications for this encounter.    Myra Gianotti, PA-C Surgical Short Stay/Anesthesiology [facility] Phone [phone] [facility] Phone [phone] 03/08/2020 4:41 PM

## 2020-03-08 NOTE — Progress Notes (Signed)
PERIOPERATIVE PRESCRIPTION FOR IMPLANTED CARDIAC DEVICE PROGRAMMING  Patient Information: Name:  Alexander Murray  DOB:  1941/07/20  MRN:  [id number]    Planned Procedure: Video Bronchoscopy, right robotic assisted thorascopy for segmentectomy  Surgeon: Servando Snare  Date of Procedure: 03/11/20  Cautery will be used.  Position during surgery:    Please send documentation back to:  [facility] (Fax # [phone])   Gleason, Thornell Mule, RN  03/08/2020 7:51 AM   Device Information:  Clinic EP Physician:  [name], MD   Device Type:  Pacemaker Manufacturer and Phone #:  Medtronic: [phone] Pacemaker Dependent?:  Yes.   Date of Last Device Check:  02/28/20 (remote) 05/09/19 (in-clinc) Normal Device Function?:  Yes.    Electrophysiologist's Recommendations:   Have magnet available.  Provide continuous ECG monitoring when magnet is used or reprogramming is to be performed.   Procedure will likely interfere with device function.  Device should be programmed:  Asynchronous pacing during procedure and returned to normal programming after procedure  Per Device Clinic Standing Orders, York Ram, RN  11:13 AM 03/08/2020

## 2020-03-08 NOTE — Pre-Procedure Instructions (Addendum)
[facility] [place], [place] AT [address] [place] [address] Phone: [phone] Fax: [phone]      Your procedure is scheduled on Monday August 9th.  Report to [facility] Main Entrance "A" at 5:30 A.M., and check in at the Admitting office.  Call this number if you have problems the morning of surgery:  [phone]  Call [phone] if you have any questions prior to your surgery date Monday-Friday 8am-4pm    Remember:  Do not eat or drink after midnight the night before your surgery     Take these medicines the morning of surgery with A SIP OF WATER   doxazosin (CARDURA) 4 MG tablet  montelukast (SINGULAIR) 10 MG tablet     acetaminophen (TYLENOL) 325 MG tablet as needed for pain    As of today, STOP taking any Aspirin (unless otherwise instructed by your surgeon) Aleve, Naproxen, Ibuprofen, Motrin, Advil, Goody's, BC's, all herbal medications, fish oil, and all vitamins.                      Do not wear jewelry            Do not wear lotions, powders, colognes, or deodorant.            Do not shave 48 hours prior to surgery.  Men may shave face and neck.            Do not bring valuables to the hospital.            [facility] is not responsible for any belongings or valuables.  Do NOT Smoke (Tobacco/Vaping) or drink Alcohol 24 hours prior to your procedure If you use a CPAP at night, you may bring all equipment for your overnight stay.   Contacts, glasses, dentures or bridgework may not be worn into surgery.      For patients admitted to the hospital, discharge time will be determined by your treatment team.   Patients discharged the day of surgery will not be allowed to drive home, and someone needs to stay with them for 24 hours.    Special instructions:   Alexander Murray- Preparing For Surgery  Before surgery, you can play an important role. Because skin is not sterile, your skin  needs to be as free of germs as possible. You can reduce the number of germs on your skin by washing with CHG (chlorahexidine gluconate) Soap before surgery.  CHG is an antiseptic cleaner which kills germs and bonds with the skin to continue killing germs even after washing.    Oral Hygiene is also important to reduce your risk of infection.  Remember - BRUSH YOUR TEETH THE MORNING OF SURGERY WITH YOUR REGULAR TOOTHPASTE  Please do not use if you have an allergy to CHG or antibacterial soaps. If your skin becomes reddened/irritated stop using the CHG.  Do not shave (including legs and underarms) for at least 48 hours prior to first CHG shower. It is OK to shave your face.  Please follow these instructions carefully.   1. Shower the NIGHT BEFORE SURGERY and the MORNING OF SURGERY with CHG Soap.   2. If you chose to wash your hair, wash your hair first as usual with your normal shampoo.  3. After you shampoo, rinse your hair and body thoroughly to remove the shampoo.  4. Use CHG as you would any other  liquid soap. You can apply CHG directly to the skin and wash gently with a scrungie or a clean washcloth.   5. Apply the CHG Soap to your body ONLY FROM THE NECK DOWN.  Do not use on open wounds or open sores. Avoid contact with your eyes, ears, mouth and genitals (private parts). Wash Face and genitals (private parts)  with your normal soap.   6. Wash thoroughly, paying special attention to the area where your surgery will be performed.  7. Thoroughly rinse your body with warm water from the neck down.  8. DO NOT shower/wash with your normal soap after using and rinsing off the CHG Soap.  9. Pat yourself dry with a CLEAN TOWEL.  10. Wear CLEAN PAJAMAS to bed the night before surgery  11. Place CLEAN SHEETS on your bed the night of your first shower and DO NOT SLEEP WITH PETS.   Day of Surgery: Wear Clean/Comfortable clothing the morning of surgery Do not apply any deodorants/lotions.    Remember to brush your teeth WITH YOUR REGULAR TOOTHPASTE.   Please read over the following fact sheets that you were given.

## 2020-03-08 NOTE — Progress Notes (Signed)
PCP - Dr. Janie Morning with [place] Cardiologist - Dr. [name]  PPM/ICD - Pacer Device Orders - Emailed representative and staff message sent Rep Notified - Nash Mantis and Elesa Hacker  Chest x-ray - 03/08/20 EKG - 03/08/20 Stress Test - Yes 2016 ECHO - 12/10/14 Cardiac Cath - Denies   Sleep Study - Denies  DM - Denies  Blood Thinner Instructions: Plavix last dose 02/26/20 Aspirin Instructions: Last dose 03/06/20  ERAS Protcol -No  COVID TEST- 03/08/20   Anesthesia review: Yes Cardiac history  Patient denies shortness of breath, fever, cough and chest pain at PAT appointment   All instructions explained to the patient, with a verbal understanding of the material. Patient agrees to go over the instructions while at home for a better understanding. Patient also instructed to self quarantine after being tested for COVID-19. The opportunity to ask questions was provided.

## 2020-03-08 NOTE — Progress Notes (Signed)
CRITICAL VALUE STICKER  CRITICAL VALUE: PTT > 200  RECEIVER (on-site recipient of call): Bandon Sherwin, RN   DATE & TIME NOTIFIED: 03/08/20 1536  MESSENGER (representative from lab):   MD NOTIFIED: Calling Dr. Servando Snare 's office  TIME OF NOTIFICATION: 9774 RESPONSE: Message given to Levonne Spiller, RN with Gerhardt's office  PA Allyson also made aware

## 2020-03-09 LAB — TYPE AND SCREEN
ABO/RH(D): O POS
Antibody Screen: NEGATIVE

## 2020-03-11 ENCOUNTER — Inpatient Hospital Stay (HOSPITAL_COMMUNITY)
Admission: RE | Admit: 2020-03-11 | Discharge: 2020-03-13 | DRG: 165 | Disposition: A | Discharge status: Home / Self Care | Payer: Medicare Other | Attending: Cardiothoracic Surgery | Admitting: Cardiothoracic Surgery

## 2020-03-11 ENCOUNTER — Encounter (HOSPITAL_COMMUNITY): Payer: Self-pay | Admitting: Cardiothoracic Surgery

## 2020-03-11 ENCOUNTER — Inpatient Hospital Stay (HOSPITAL_COMMUNITY): Payer: Medicare Other | Admitting: Vascular Surgery

## 2020-03-11 ENCOUNTER — Encounter (HOSPITAL_COMMUNITY)
Admission: RE | Disposition: A | Discharge status: Home / Self Care | Payer: Self-pay | Source: Home / Self Care | Attending: Cardiothoracic Surgery

## 2020-03-11 ENCOUNTER — Inpatient Hospital Stay (HOSPITAL_COMMUNITY): Payer: Medicare Other | Admitting: Certified Registered"

## 2020-03-11 ENCOUNTER — Inpatient Hospital Stay (HOSPITAL_COMMUNITY): Payer: Medicare Other

## 2020-03-11 DIAGNOSIS — E7800 Pure hypercholesterolemia, unspecified: Secondary | ICD-10-CM | POA: Diagnosis not present

## 2020-03-11 DIAGNOSIS — K219 Gastro-esophageal reflux disease without esophagitis: Secondary | ICD-10-CM | POA: Diagnosis not present

## 2020-03-11 DIAGNOSIS — I441 Atrioventricular block, second degree: Secondary | ICD-10-CM | POA: Diagnosis present

## 2020-03-11 DIAGNOSIS — C719 Malignant neoplasm of brain, unspecified: Secondary | ICD-10-CM | POA: Diagnosis not present

## 2020-03-11 DIAGNOSIS — Z7902 Long term (current) use of antithrombotics/antiplatelets: Secondary | ICD-10-CM

## 2020-03-11 DIAGNOSIS — Z86010 Personal history of colonic polyps: Secondary | ICD-10-CM | POA: Diagnosis not present

## 2020-03-11 DIAGNOSIS — J80 Acute respiratory distress syndrome: Secondary | ICD-10-CM | POA: Diagnosis not present

## 2020-03-11 DIAGNOSIS — Z7982 Long term (current) use of aspirin: Secondary | ICD-10-CM

## 2020-03-11 DIAGNOSIS — I7090 Unspecified atherosclerosis: Secondary | ICD-10-CM | POA: Diagnosis not present

## 2020-03-11 DIAGNOSIS — R911 Solitary pulmonary nodule: Secondary | ICD-10-CM | POA: Diagnosis not present

## 2020-03-11 DIAGNOSIS — Z20822 Contact with and (suspected) exposure to covid-19: Secondary | ICD-10-CM | POA: Diagnosis not present

## 2020-03-11 DIAGNOSIS — G319 Degenerative disease of nervous system, unspecified: Secondary | ICD-10-CM | POA: Diagnosis not present

## 2020-03-11 DIAGNOSIS — R9431 Abnormal electrocardiogram [ECG] [EKG]: Secondary | ICD-10-CM | POA: Diagnosis not present

## 2020-03-11 DIAGNOSIS — Z950 Presence of cardiac pacemaker: Secondary | ICD-10-CM

## 2020-03-11 DIAGNOSIS — I10 Essential (primary) hypertension: Secondary | ICD-10-CM | POA: Diagnosis present

## 2020-03-11 DIAGNOSIS — J3489 Other specified disorders of nose and nasal sinuses: Secondary | ICD-10-CM | POA: Diagnosis not present

## 2020-03-11 DIAGNOSIS — Z85820 Personal history of malignant melanoma of skin: Secondary | ICD-10-CM | POA: Diagnosis not present

## 2020-03-11 DIAGNOSIS — C3431 Malignant neoplasm of lower lobe, right bronchus or lung: Secondary | ICD-10-CM | POA: Diagnosis not present

## 2020-03-11 DIAGNOSIS — Z902 Acquired absence of lung [part of]: Secondary | ICD-10-CM

## 2020-03-11 DIAGNOSIS — Z8673 Personal history of transient ischemic attack (TIA), and cerebral infarction without residual deficits: Secondary | ICD-10-CM | POA: Diagnosis not present

## 2020-03-11 DIAGNOSIS — J90 Pleural effusion, not elsewhere classified: Secondary | ICD-10-CM | POA: Diagnosis not present

## 2020-03-11 DIAGNOSIS — Z87891 Personal history of nicotine dependence: Secondary | ICD-10-CM | POA: Diagnosis not present

## 2020-03-11 DIAGNOSIS — J9811 Atelectasis: Secondary | ICD-10-CM | POA: Diagnosis not present

## 2020-03-11 DIAGNOSIS — Z4682 Encounter for fitting and adjustment of non-vascular catheter: Secondary | ICD-10-CM

## 2020-03-11 DIAGNOSIS — J45909 Unspecified asthma, uncomplicated: Secondary | ICD-10-CM | POA: Diagnosis not present

## 2020-03-11 HISTORY — PX: INTERCOSTAL NERVE BLOCK: SHX5021

## 2020-03-11 HISTORY — PX: VIDEO BRONCHOSCOPY: SHX5072

## 2020-03-11 HISTORY — PX: XI ROBOTIC ASSISTED THORACOSCOPY- SEGMENTECTOMY: SHX6881

## 2020-03-11 LAB — PROTIME-INR
INR: 1.2 (ref 0.8–1.2)
Prothrombin Time: 14.4 seconds (ref 11.4–15.2)

## 2020-03-11 LAB — APTT: aPTT: 33 seconds (ref 24–36)

## 2020-03-11 LAB — PLATELET COUNT: Platelets: 134 10*3/uL — ABNORMAL LOW (ref 150–400)

## 2020-03-11 IMAGING — DX DG CHEST 1V PORT
1 series · 1 of 1 positions shown · non-contrast
Comparison: [DATE]

CLINICAL DATA: Status post partial lobectomy. Chest tube placement.

EXAM:
PORTABLE CHEST 1 VIEW

[chest ap]
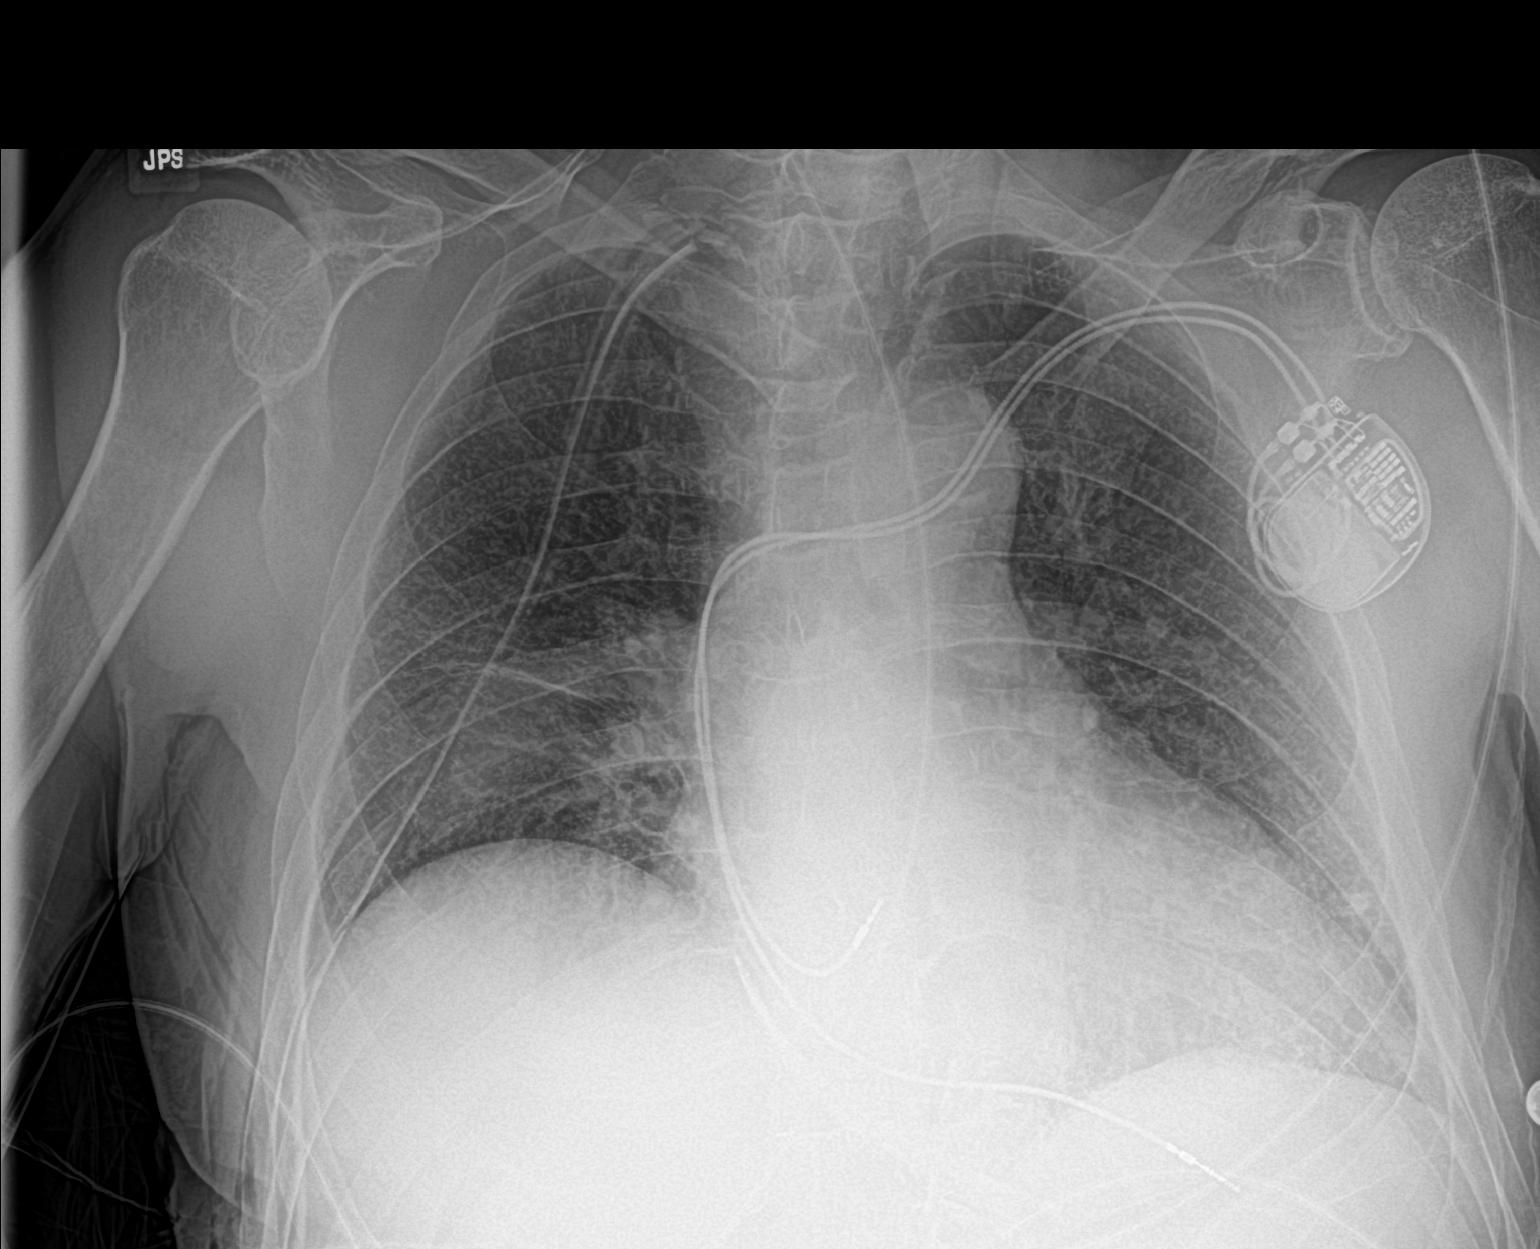

[1 of 1 positions shown; findings below may reference images not displayed]

FINDINGS: There are postsurgical changes in the right lower lung from interval
partial lobectomy. There is a right-sided chest tube directed
towards the apex. There is no focal consolidation. There is mild
right basilar atelectasis. There is no pleural effusion or
pneumothorax. The heart and mediastinal contours are unremarkable.
There is a dual lead cardiac pacemaker.

There is no acute osseous abnormality.
IMPRESSION: 1. Right-sided chest tube directed towards the apex. No
pneumothorax.
2. Right basilar atelectasis.

## 2020-03-11 SURGERY — RESECTION, LUNG, SEGMENTAL, ROBOT-ASSISTED
Anesthesia: General | Site: Chest | Laterality: Right

## 2020-03-11 MED ORDER — LACTATED RINGERS IV SOLN
INTRAVENOUS | Status: DC | PRN
Start: 2020-03-11 — End: 2020-03-11

## 2020-03-11 MED ORDER — BISACODYL 5 MG PO TBEC
10.0000 mg | DELAYED_RELEASE_TABLET | Freq: Every day | ORAL | Status: DC
  Administered 2020-03-12: 10 mg via ORAL
  Filled 2020-03-11 (×2): qty 2

## 2020-03-11 MED ORDER — BUPIVACAINE LIPOSOME 1.3 % IJ SUSP
20.0000 mL | Freq: Once | INTRAMUSCULAR | Status: DC
  Filled 2020-03-11: qty 20

## 2020-03-11 MED ORDER — METOCLOPRAMIDE HCL 5 MG/ML IJ SOLN
10.0000 mg | Freq: Four times a day (QID) | INTRAMUSCULAR | Status: AC
  Administered 2020-03-11 – 2020-03-12 (×3): 10 mg via INTRAVENOUS
  Filled 2020-03-11 (×3): qty 2

## 2020-03-11 MED ORDER — PHENYLEPHRINE 40 MCG/ML (10ML) SYRINGE FOR IV PUSH (FOR BLOOD PRESSURE SUPPORT)
PREFILLED_SYRINGE | INTRAVENOUS | Status: DC | PRN
  Administered 2020-03-11 (×2): 40 ug via INTRAVENOUS

## 2020-03-11 MED ORDER — SODIUM CHLORIDE 0.45 % IV SOLN: INTRAVENOUS | Status: DC

## 2020-03-11 MED ORDER — FENTANYL CITRATE (PF) 100 MCG/2ML IJ SOLN
25.0000 ug | INTRAMUSCULAR | Status: DC | PRN
  Administered 2020-03-11: 50 ug via INTRAVENOUS
  Administered 2020-03-11: 25 ug via INTRAVENOUS

## 2020-03-11 MED ORDER — PHENYLEPHRINE HCL-NACL 10-0.9 MG/250ML-% IV SOLN
INTRAVENOUS | Status: DC | PRN
Start: 2020-03-11 — End: 2020-03-11
  Administered 2020-03-11: 25 ug/min via INTRAVENOUS

## 2020-03-11 MED ORDER — TRAMADOL HCL 50 MG PO TABS
50.0000 mg | ORAL_TABLET | Freq: Four times a day (QID) | ORAL | Status: DC | PRN
  Administered 2020-03-13: 50 mg via ORAL
  Filled 2020-03-11: qty 1

## 2020-03-11 MED ORDER — MONTELUKAST SODIUM 10 MG PO TABS
10.0000 mg | ORAL_TABLET | ORAL | Status: DC
  Administered 2020-03-12 – 2020-03-13 (×2): 10 mg via ORAL
  Filled 2020-03-11 (×2): qty 1

## 2020-03-11 MED ORDER — PANTOPRAZOLE SODIUM 40 MG PO PACK
40.0000 mg | PACK | Freq: Every day | ORAL | Status: DC
  Administered 2020-03-12 – 2020-03-13 (×2): 40 mg via ORAL
  Filled 2020-03-11 (×2): qty 20

## 2020-03-11 MED ORDER — ONDANSETRON HCL 4 MG/2ML IJ SOLN: 4.0000 mg | Freq: Four times a day (QID) | INTRAMUSCULAR | Status: DC | PRN

## 2020-03-11 MED ORDER — ESOMEPRAZOLE MAGNESIUM 20 MG PO PACK: 20.0000 mg | PACK | Freq: Every day | ORAL | Status: DC

## 2020-03-11 MED ORDER — AMLODIPINE BESYLATE 2.5 MG PO TABS
2.5000 mg | ORAL_TABLET | Freq: Every day | ORAL | Status: DC
  Administered 2020-03-11 – 2020-03-12 (×2): 2.5 mg via ORAL
  Filled 2020-03-11 (×2): qty 1

## 2020-03-11 MED ORDER — CEFAZOLIN SODIUM-DEXTROSE 2-4 GM/100ML-% IV SOLN
2.0000 g | INTRAVENOUS | Status: AC
  Administered 2020-03-11: 2 g via INTRAVENOUS
  Filled 2020-03-11: qty 100

## 2020-03-11 MED ORDER — CEFAZOLIN SODIUM-DEXTROSE 2-4 GM/100ML-% IV SOLN
2.0000 g | Freq: Three times a day (TID) | INTRAVENOUS | Status: AC
  Administered 2020-03-11 (×2): 2 g via INTRAVENOUS
  Filled 2020-03-11 (×2): qty 100

## 2020-03-11 MED ORDER — CHLORHEXIDINE GLUCONATE 0.12 % MT SOLN
15.0000 mL | Freq: Once | OROMUCOSAL | Status: AC
  Administered 2020-03-11: 15 mL via OROMUCOSAL
  Filled 2020-03-11: qty 15

## 2020-03-11 MED ORDER — FENTANYL CITRATE (PF) 250 MCG/5ML IJ SOLN
INTRAMUSCULAR | Status: AC
  Filled 2020-03-11: qty 5

## 2020-03-11 MED ORDER — ROCURONIUM BROMIDE 10 MG/ML (PF) SYRINGE
PREFILLED_SYRINGE | INTRAVENOUS | Status: DC | PRN
  Administered 2020-03-11: 20 mg via INTRAVENOUS
  Administered 2020-03-11: 70 mg via INTRAVENOUS

## 2020-03-11 MED ORDER — FENTANYL CITRATE (PF) 250 MCG/5ML IJ SOLN
INTRAMUSCULAR | Status: DC | PRN
  Administered 2020-03-11: 150 ug via INTRAVENOUS
  Administered 2020-03-11: 50 ug via INTRAVENOUS

## 2020-03-11 MED ORDER — OXYCODONE HCL 5 MG PO TABS: 5.0000 mg | ORAL_TABLET | ORAL | Status: DC | PRN

## 2020-03-11 MED ORDER — EPHEDRINE SULFATE-NACL 50-0.9 MG/10ML-% IV SOSY
PREFILLED_SYRINGE | INTRAVENOUS | Status: DC | PRN
  Administered 2020-03-11: 5 mg via INTRAVENOUS

## 2020-03-11 MED ORDER — DOXAZOSIN MESYLATE 4 MG PO TABS
4.0000 mg | ORAL_TABLET | Freq: Every day | ORAL | Status: DC
  Administered 2020-03-12 – 2020-03-13 (×2): 4 mg via ORAL
  Filled 2020-03-11 (×2): qty 1

## 2020-03-11 MED ORDER — LACTATED RINGERS IV SOLN: INTRAVENOUS | Status: DC

## 2020-03-11 MED ORDER — 0.9 % SODIUM CHLORIDE (POUR BTL) OPTIME
TOPICAL | Status: DC | PRN
  Administered 2020-03-11: 2000 mL

## 2020-03-11 MED ORDER — ACETAMINOPHEN 160 MG/5ML PO SOLN: 1000.0000 mg | Freq: Once | ORAL | Status: DC | PRN

## 2020-03-11 MED ORDER — LIDOCAINE 2% (20 MG/ML) 5 ML SYRINGE
INTRAMUSCULAR | Status: DC | PRN
  Administered 2020-03-11: 60 mg via INTRAVENOUS

## 2020-03-11 MED ORDER — ASPIRIN EC 81 MG PO TBEC
81.0000 mg | DELAYED_RELEASE_TABLET | Freq: Every day | ORAL | Status: DC
  Administered 2020-03-12 – 2020-03-13 (×2): 81 mg via ORAL
  Filled 2020-03-11 (×2): qty 1

## 2020-03-11 MED ORDER — ONDANSETRON HCL 4 MG/2ML IJ SOLN
INTRAMUSCULAR | Status: DC | PRN
  Administered 2020-03-11: 4 mg via INTRAVENOUS

## 2020-03-11 MED ORDER — FENTANYL CITRATE (PF) 100 MCG/2ML IJ SOLN
INTRAMUSCULAR | Status: AC
  Filled 2020-03-11: qty 2

## 2020-03-11 MED ORDER — DEXAMETHASONE SODIUM PHOSPHATE 10 MG/ML IJ SOLN
INTRAMUSCULAR | Status: DC | PRN
  Administered 2020-03-11: 4 mg via INTRAVENOUS

## 2020-03-11 MED ORDER — OXYCODONE HCL 5 MG/5ML PO SOLN: 5.0000 mg | Freq: Once | ORAL | Status: DC | PRN

## 2020-03-11 MED ORDER — CELECOXIB 200 MG PO CAPS
200.0000 mg | ORAL_CAPSULE | Freq: Two times a day (BID) | ORAL | Status: DC | PRN
  Filled 2020-03-11: qty 1

## 2020-03-11 MED ORDER — ACETAMINOPHEN 10 MG/ML IV SOLN
INTRAVENOUS | Status: AC
  Filled 2020-03-11: qty 100

## 2020-03-11 MED ORDER — ACETAMINOPHEN 500 MG PO TABS
1000.0000 mg | ORAL_TABLET | Freq: Four times a day (QID) | ORAL | Status: DC
  Administered 2020-03-11 – 2020-03-13 (×8): 1000 mg via ORAL
  Filled 2020-03-11 (×8): qty 2

## 2020-03-11 MED ORDER — MIDAZOLAM HCL 2 MG/2ML IJ SOLN
INTRAMUSCULAR | Status: AC
  Filled 2020-03-11: qty 2

## 2020-03-11 MED ORDER — FENTANYL CITRATE (PF) 100 MCG/2ML IJ SOLN: 25.0000 ug | INTRAMUSCULAR | Status: DC | PRN

## 2020-03-11 MED ORDER — MIDAZOLAM HCL 5 MG/5ML IJ SOLN
INTRAMUSCULAR | Status: DC | PRN
  Administered 2020-03-11: 1 mg via INTRAVENOUS

## 2020-03-11 MED ORDER — OXYCODONE HCL 5 MG PO TABS: 5.0000 mg | ORAL_TABLET | Freq: Once | ORAL | Status: DC | PRN

## 2020-03-11 MED ORDER — ORAL CARE MOUTH RINSE: 15.0000 mL | Freq: Once | OROMUCOSAL | Status: AC

## 2020-03-11 MED ORDER — ACETAMINOPHEN 10 MG/ML IV SOLN
1000.0000 mg | Freq: Once | INTRAVENOUS | Status: DC | PRN
  Administered 2020-03-11: 1000 mg via INTRAVENOUS

## 2020-03-11 MED ORDER — HEMOSTATIC AGENTS (NO CHARGE) OPTIME
TOPICAL | Status: DC | PRN
  Administered 2020-03-11 (×2): 1 via TOPICAL

## 2020-03-11 MED ORDER — BUPIVACAINE HCL (PF) 0.5 % IJ SOLN
INTRAMUSCULAR | Status: AC
  Filled 2020-03-11: qty 30

## 2020-03-11 MED ORDER — PROPOFOL 10 MG/ML IV BOLUS
INTRAVENOUS | Status: AC
  Filled 2020-03-11: qty 20

## 2020-03-11 MED ORDER — CHLORTHALIDONE 25 MG PO TABS
25.0000 mg | ORAL_TABLET | ORAL | Status: DC
  Administered 2020-03-13: 25 mg via ORAL
  Filled 2020-03-11: qty 1

## 2020-03-11 MED ORDER — ACETAMINOPHEN 500 MG PO TABS: 1000.0000 mg | ORAL_TABLET | Freq: Once | ORAL | Status: DC | PRN

## 2020-03-11 MED ORDER — SUGAMMADEX SODIUM 200 MG/2ML IV SOLN
INTRAVENOUS | Status: DC | PRN
  Administered 2020-03-11: 200 mg via INTRAVENOUS

## 2020-03-11 MED ORDER — PROPOFOL 10 MG/ML IV BOLUS
INTRAVENOUS | Status: DC | PRN
  Administered 2020-03-11: 130 mg via INTRAVENOUS

## 2020-03-11 MED ORDER — SIMVASTATIN 20 MG PO TABS
40.0000 mg | ORAL_TABLET | Freq: Every day | ORAL | Status: DC
  Administered 2020-03-11 – 2020-03-12 (×2): 40 mg via ORAL
  Filled 2020-03-11 (×2): qty 2

## 2020-03-11 MED ORDER — SODIUM CHLORIDE FLUSH 0.9 % IV SOLN
INTRAVENOUS | Status: DC | PRN
  Administered 2020-03-11: 100 mL

## 2020-03-11 MED ORDER — ACETAMINOPHEN 160 MG/5ML PO SOLN: 1000.0000 mg | Freq: Four times a day (QID) | ORAL | Status: DC

## 2020-03-11 MED ORDER — SENNOSIDES-DOCUSATE SODIUM 8.6-50 MG PO TABS
1.0000 | ORAL_TABLET | Freq: Every day | ORAL | Status: DC
  Administered 2020-03-11 – 2020-03-12 (×2): 1 via ORAL
  Filled 2020-03-11 (×2): qty 1

## 2020-03-11 SURGICAL SUPPLY — 126 items
ADAPTER VALVE BIOPSY EBUS (MISCELLANEOUS) IMPLANT
ADH SKN CLS APL DERMABOND .7 (GAUZE/BANDAGES/DRESSINGS) ×3
ADPTR VALVE BIOPSY EBUS (MISCELLANEOUS)
ANCHOR CATH FOLEY SECURE (MISCELLANEOUS) ×4 IMPLANT
APL SRG 22X2 LUM MLBL SLNT (VASCULAR PRODUCTS)
APPLICATOR TIP EXT COSEAL (VASCULAR PRODUCTS) IMPLANT
BAG SPEC RTRVL 10 TROC 200 (ENDOMECHANICALS)
BAG SPEC RTRVL C125 8X14 (MISCELLANEOUS) ×3
BLADE CLIPPER SURG (BLADE) ×3 IMPLANT
BNDG COHESIVE 6X5 TAN STRL LF (GAUZE/BANDAGES/DRESSINGS) ×3 IMPLANT
BRUSH CYTOL CELLEBRITY 1.5X140 (MISCELLANEOUS) IMPLANT
CANISTER SUCT 3000ML PPV (MISCELLANEOUS) ×8 IMPLANT
CANNULA REDUC XI 12-8 STAPL (CANNULA) ×8
CANNULA REDUCER 12-8 DVNC XI (CANNULA) ×6 IMPLANT
CATH THORACIC 28FR (CATHETERS) IMPLANT
CATH THORACIC 36FR (CATHETERS) IMPLANT
CATH THORACIC 36FR RT ANG (CATHETERS) IMPLANT
CLIP VESOCCLUDE MED 6/CT (CLIP) IMPLANT
CNTNR URN SCR LID CUP LEK RST (MISCELLANEOUS) ×15 IMPLANT
CONN ST 1/4X3/8  BEN (MISCELLANEOUS) ×4
CONN ST 1/4X3/8 BEN (MISCELLANEOUS) IMPLANT
CONN Y 3/8X3/8X3/8  BEN (MISCELLANEOUS)
CONN Y 3/8X3/8X3/8 BEN (MISCELLANEOUS) IMPLANT
CONT SPEC 4OZ STRL OR WHT (MISCELLANEOUS) ×36
COVER BACK TABLE 60X90IN (DRAPES) ×3 IMPLANT
COVER SURGICAL LIGHT HANDLE (MISCELLANEOUS) ×3 IMPLANT
DEFOGGER SCOPE WARMER CLEARIFY (MISCELLANEOUS) ×4 IMPLANT
DERMABOND ADVANCED (GAUZE/BANDAGES/DRESSINGS) ×1
DERMABOND ADVANCED .7 DNX12 (GAUZE/BANDAGES/DRESSINGS) ×6 IMPLANT
DISSECTOR BLUNT TIP ENDO 5MM (MISCELLANEOUS) IMPLANT
DRAIN CHANNEL 28F RND 3/8 FF (WOUND CARE) ×1 IMPLANT
DRAPE ARM DVNC X/XI (DISPOSABLE) ×12 IMPLANT
DRAPE COLUMN DVNC XI (DISPOSABLE) ×3 IMPLANT
DRAPE CV SPLIT W-CLR ANES SCRN (DRAPES) ×4 IMPLANT
DRAPE DA VINCI XI ARM (DISPOSABLE) ×16
DRAPE DA VINCI XI COLUMN (DISPOSABLE) ×4
DRAPE HALF SHEET 40X57 (DRAPES) ×1 IMPLANT
ELECT BLADE 4.0 EZ CLEAN MEGAD (MISCELLANEOUS) ×4
ELECT REM PT RETURN 9FT ADLT (ELECTROSURGICAL) ×4
ELECTRODE BLDE 4.0 EZ CLN MEGD (MISCELLANEOUS) ×3 IMPLANT
ELECTRODE REM PT RTRN 9FT ADLT (ELECTROSURGICAL) ×3 IMPLANT
FILTER SMOKE EVAC ULPA (FILTER) ×4 IMPLANT
FORCEPS BIOP RJ4 1.8 (CUTTING FORCEPS) IMPLANT
GAUZE KITTNER 4X10 (MISCELLANEOUS) ×8 IMPLANT
GAUZE SPONGE 4X4 12PLY STRL (GAUZE/BANDAGES/DRESSINGS) ×4 IMPLANT
GLOVE BIO SURGEON STRL SZ 6.5 (GLOVE) ×14 IMPLANT
GLOVE BIOGEL PI IND STRL 6.5 (GLOVE) IMPLANT
GLOVE BIOGEL PI INDICATOR 6.5 (GLOVE) ×2
GLOVE ECLIPSE 7.5 STRL STRAW (GLOVE) ×1 IMPLANT
GOWN STRL REUS W/ TWL LRG LVL3 (GOWN DISPOSABLE) ×9 IMPLANT
GOWN STRL REUS W/TWL 2XL LVL3 (GOWN DISPOSABLE) ×4 IMPLANT
GOWN STRL REUS W/TWL LRG LVL3 (GOWN DISPOSABLE) ×24
HEMOSTAT SURGICEL 2X14 (HEMOSTASIS) ×11 IMPLANT
IRRIGATION STRYKERFLOW (MISCELLANEOUS) ×3 IMPLANT
IRRIGATOR STRYKERFLOW (MISCELLANEOUS) ×4
KIT BASIN OR (CUSTOM PROCEDURE TRAY) ×4 IMPLANT
KIT CLEAN ENDO COMPLIANCE (KITS) ×4 IMPLANT
KIT TURNOVER KIT B (KITS) ×4 IMPLANT
MARKER SKIN DUAL TIP RULER LAB (MISCELLANEOUS) ×1 IMPLANT
NS IRRIG 1000ML POUR BTL (IV SOLUTION) ×4 IMPLANT
OIL SILICONE PENTAX (PARTS (SERVICE/REPAIRS)) ×4 IMPLANT
PACK CHEST (CUSTOM PROCEDURE TRAY) ×4 IMPLANT
PAD ARMBOARD 7.5X6 YLW CONV (MISCELLANEOUS) ×20 IMPLANT
PASSER SUT SWANSON 36MM LOOP (INSTRUMENTS) IMPLANT
PENCIL SMOKE EVACUATOR (MISCELLANEOUS) ×3 IMPLANT
POUCH RETRIEVAL ECOSAC 10 (ENDOMECHANICALS) ×3 IMPLANT
POUCH RETRIEVAL ECOSAC 10MM (ENDOMECHANICALS)
RELOAD STAPLE 45 3.5 BLU DVNC (STAPLE) IMPLANT
RELOAD STAPLER 3.5X45 BLU DVNC (STAPLE) ×12 IMPLANT
SEAL CANN UNIV 5-8 DVNC XI (MISCELLANEOUS) ×6 IMPLANT
SEAL XI 5MM-8MM UNIVERSAL (MISCELLANEOUS) ×8
SEALANT PROGEL (MISCELLANEOUS) IMPLANT
SEALANT SURG COSEAL 4ML (VASCULAR PRODUCTS) IMPLANT
SEALANT SURG COSEAL 8ML (VASCULAR PRODUCTS) IMPLANT
SEALER LIGASURE MARYLAND 30 (ELECTROSURGICAL) IMPLANT
SET TUBE SMOKE EVAC HIGH FLOW (TUBING) ×4 IMPLANT
SHEET MEDIUM DRAPE 40X70 STRL (DRAPES) ×3 IMPLANT
SLEEVE SUCTION 125 (MISCELLANEOUS) ×3 IMPLANT
SOL ANTI FOG 6CC (MISCELLANEOUS) IMPLANT
SOLUTION ANTI FOG 6CC (MISCELLANEOUS)
SOLUTION ELECTROLUBE (MISCELLANEOUS) ×4 IMPLANT
STAPLER 45 DA VINCI SURE FORM (STAPLE) ×4
STAPLER 45 SUREFORM DVNC (STAPLE) IMPLANT
STAPLER CANNULA SEAL DVNC XI (STAPLE) ×6 IMPLANT
STAPLER CANNULA SEAL XI (STAPLE) ×8
STAPLER RELOAD 3.5X45 BLU DVNC (STAPLE) ×12
STAPLER RELOAD 3.5X45 BLUE (STAPLE) ×16
STOPCOCK 4 WAY LG BORE MALE ST (IV SETS) ×4 IMPLANT
SUT PROLENE 3 0 SH DA (SUTURE) IMPLANT
SUT PROLENE 4 0 RB 1 (SUTURE)
SUT PROLENE 4-0 RB1 .5 CRCL 36 (SUTURE) IMPLANT
SUT SILK  1 MH (SUTURE) ×12
SUT SILK 1 MH (SUTURE) ×6 IMPLANT
SUT SILK 1 TIES 10X30 (SUTURE) IMPLANT
SUT SILK 2 0SH CR/8 30 (SUTURE) IMPLANT
SUT VIC AB 1 CTX 18 (SUTURE) IMPLANT
SUT VIC AB 1 CTX 36 (SUTURE)
SUT VIC AB 1 CTX36XBRD ANBCTR (SUTURE) IMPLANT
SUT VIC AB 2-0 CTX 36 (SUTURE) ×4 IMPLANT
SUT VIC AB 3-0 X1 27 (SUTURE) ×8 IMPLANT
SUT VICRYL 0 TIES 12 18 (SUTURE) ×4 IMPLANT
SUT VICRYL 0 UR6 27IN ABS (SUTURE) ×9 IMPLANT
SUT VICRYL 2 TP 1 (SUTURE) IMPLANT
SYR 20ML ECCENTRIC (SYRINGE) ×4 IMPLANT
SYR 20ML LL LF (SYRINGE) ×4 IMPLANT
SYR 50ML LL SCALE MARK (SYRINGE) ×4 IMPLANT
SYR 5ML LL (SYRINGE) ×3 IMPLANT
SYSTEM RETRIEVAL ANCHOR 8 (MISCELLANEOUS) ×1 IMPLANT
SYSTEM SAHARA CHEST DRAIN ATS (WOUND CARE) ×4 IMPLANT
TAPE CLOTH 4X10 WHT NS (GAUZE/BANDAGES/DRESSINGS) ×4 IMPLANT
TAPE CLOTH SURG 4X10 WHT LF (GAUZE/BANDAGES/DRESSINGS) ×1 IMPLANT
TAPE UMBILICAL COTTON 1/8X30 (MISCELLANEOUS) IMPLANT
TIP APPLICATOR SPRAY EXTEND 16 (VASCULAR PRODUCTS) IMPLANT
TOWEL GREEN STERILE (TOWEL DISPOSABLE) ×8 IMPLANT
TOWEL GREEN STERILE FF (TOWEL DISPOSABLE) ×4 IMPLANT
TRAP FLUID SMOKE EVACUATOR (MISCELLANEOUS) ×3 IMPLANT
TRAP SPECIMEN MUCUS 40CC (MISCELLANEOUS) IMPLANT
TRAY FOLEY MTR SLVR 16FR STAT (SET/KITS/TRAYS/PACK) ×4 IMPLANT
TROCAR XCEL 12X100 BLDLESS (ENDOMECHANICALS) ×4 IMPLANT
TROCAR XCEL BLADELESS 5X75MML (TROCAR) IMPLANT
TUBE CONNECTING 20X1/4 (TUBING) ×4 IMPLANT
TUBING EXTENTION W/L.L. (IV SETS) ×4 IMPLANT
VALVE BIOPSY  SINGLE USE (MISCELLANEOUS) ×4
VALVE BIOPSY SINGLE USE (MISCELLANEOUS) ×3 IMPLANT
VALVE SUCTION BRONCHIO DISP (MISCELLANEOUS) ×4 IMPLANT
WATER STERILE IRR 1000ML POUR (IV SOLUTION) ×4 IMPLANT

## 2020-03-11 NOTE — Anesthesia Procedure Notes (Signed)
Arterial Line Insertion Start/End8/04/2020 7:44 AM, 03/11/2020 7:46 AM Performed by: Harden Mo, CRNA, CRNA  Patient location: OR. Preanesthetic checklist: patient identified, IV checked, site marked, risks and benefits discussed, surgical consent, monitors and equipment checked, pre-op evaluation, timeout performed and anesthesia consent Lidocaine 1% used for infiltration and patient sedated Left, radial was placed Catheter size: 20 G Hand hygiene performed  and maximum sterile barriers used  Allen's test indicative of satisfactory collateral circulation Attempts: 4 Procedure performed without using ultrasound guided technique. Following insertion, Biopatch. Post procedure assessment: normal  Post procedure complications: local hematoma. Patient tolerated the procedure well with no immediate complications.

## 2020-03-11 NOTE — Anesthesia Procedure Notes (Signed)
Procedure Name: Intubation Date/Time: 03/11/2020 7:29 AM Performed by: Wilburn Cornelia, CRNA Pre-anesthesia Checklist: Emergency Drugs available, Patient identified, Suction available, Patient being monitored and Timeout performed Patient Re-evaluated:Patient Re-evaluated prior to induction Oxygen Delivery Method: Circle system utilized Preoxygenation: Pre-oxygenation with 100% oxygen Induction Type: IV induction Ventilation: Mask ventilation without difficulty Laryngoscope Size: Mac and 4 Grade View: Grade II Tube type: Oral Tube size: 8.5 mm Number of attempts: 1 Airway Equipment and Method: Stylet Placement Confirmation: ETT inserted through vocal cords under direct vision,  positive ETCO2,  CO2 detector and breath sounds checked- equal and bilateral Secured at: 23 cm Tube secured with: Tape Dental Injury: Teeth and Oropharynx as per pre-operative assessment

## 2020-03-11 NOTE — Brief Op Note (Addendum)
      [address]       [place] [address]             [phone]    03/11/2020  10:09 AM  PATIENT:  Alexander Murray  79 y.o. male  PRE-OPERATIVE DIAGNOSIS:  PULMONARY NODULE  POST-OPERATIVE DIAGNOSIS: Neuroendocrine Tumor   PROCEDURE:  Procedure(s): XI ROBOTIC ASSISTED THORACOSCOPY-RIGHT SUPERIOR SEGMENTECTOMY WITH NODE SAMPLES (Right) VIDEO BRONCHOSCOPY (N/A) INTERCOSTAL NERVE BLOCK (Right)  SURGEON:  Surgeon(s) and Role:    * Grace Isaac, MD - Primary  PHYSICIAN ASSISTANT: Ellwood Handler PA-C  ANESTHESIA:   general  EBL:  20 mL   BLOOD ADMINISTERED:none  DRAINS: 28 [address]   LOCAL MEDICATIONS USED:  BUPIVICAINE   SPECIMEN:  Source of Specimen:  Right Superior Segment, lymph nodes, 8  11 r 10 r nodes   DISPOSITION OF SPECIMEN:  PATHOLOGY  COUNTS:  YES  DICTATION: .Dragon Dictation  PLAN OF CARE: Admit to inpatient   PATIENT DISPOSITION:  PACU - hemodynamically stable.   Delay start of Pharmacological VTE agent (>24hrs) due to surgical blood loss or risk of bleeding: no

## 2020-03-11 NOTE — Plan of Care (Signed)
  Problem: Education: Goal: Knowledge of General Education information will improve Description: Including pain rating scale, medication(s)/side effects and non-pharmacologic comfort measures Outcome: Progressing   Problem: Health Behavior/Discharge Planning: Goal: Ability to manage health-related needs will improve Outcome: Progressing   Problem: Clinical Measurements: Goal: Ability to maintain clinical measurements within normal limits will improve Outcome: Progressing Goal: Will remain free from infection Outcome: Progressing Goal: Diagnostic test results will improve Outcome: Progressing Goal: Respiratory complications will improve Outcome: Progressing   Problem: Activity: Goal: Risk for activity intolerance will decrease Outcome: Progressing   Problem: Nutrition: Goal: Adequate nutrition will be maintained Outcome: Progressing

## 2020-03-11 NOTE — Progress Notes (Signed)
Patient ID: Alexander Murray, male   DOB: January 24, 1941, 79 y.o.   MRN: [id number] EVENING ROUNDS NOTE :     [address]       [place] [address]             [phone]                 Day of Surgery Procedure(s) (LRB): XI ROBOTIC ASSISTED THORACOSCOPY-RIGHT SUPERIOR SEGMENTECTOMY WITH NODE SAMPLES (Right) VIDEO BRONCHOSCOPY (N/A) INTERCOSTAL NERVE BLOCK (Right)  Total Length of Stay:  LOS: 0 days  BP 115/70 (BP Location: Right Arm)   Pulse 72   Temp 97.8 F (36.6 C) (Oral)   Resp 19   Ht 5\' 10"  (1.778 m)   Wt 72.8 kg   SpO2 97%   BMI 23.03 kg/m   .Intake/Output      08/08 0701 - 08/09 0700 08/09 0701 - 08/10 0700   I.V. (mL/kg)  1350 (18.5)   Total Intake(mL/kg)  1350 (18.5)   Urine (mL/kg/hr)  675 (1)   Blood  20   Chest Tube  128   Total Output  823   Net  +527          . sodium chloride 100 mL/hr at 03/11/20 1502  . acetaminophen    .  ceFAZolin (ANCEF) IV       Lab Results  Component Value Date   WBC 5.5 03/08/2020   HGB 14.0 03/08/2020   HCT 43.0 03/08/2020   PLT 134 (L) 03/11/2020   GLUCOSE 90 03/08/2020   ALT 23 03/08/2020   AST 31 03/08/2020   NA 139 03/08/2020   K 4.0 03/08/2020   CL 107 03/08/2020   CREATININE 0.87 03/08/2020   BUN 11 03/08/2020   CO2 23 03/08/2020   TSH 1.95 12/05/2014   INR 1.2 03/11/2020   Stable postop op in room, wife in to see him minimal chest tube drainage  No air leak A line and foley are out    Grace Isaac MD  Beeper [phone] Office [phone] 03/11/2020 4:28 PM

## 2020-03-11 NOTE — H&P (Signed)
[address]       [place] [address]             [phone]                    Alexander Murray Medical Record #[id number] Date of Birth: 1940-11-21  Referring: Alexander Battles, MD Primary Care: Alexander Battles, MD Primary Cardiologist: No primary care provider on file.  Chief Complaint:    Chief Complaint  Patient presents with  . Lung Lesion    Surgical consult, PET scan 02/09/20    History of Present Illness:    Alexander Murray 79 y.o. male was  seen in the office for 1.8 cm right lower lobe lung nodule noted on recent CT scan of the chest done in [facility]. The patient notes that he had a recent endoscopy and thought he had aspirated during this procedure following he had a persistent cough leading to chest x-ray, chest x-ray with nipple markers CT scan of the chest and ultimately a PET scan of the chest done at [facility] long recently to evaluate right lung nodule. The patient's only previous CT scans of the chest was one done primarily for this thoracic spine in August 2018. -Although limited views this lesion was not noted at that time.   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 Caryl Murray after placement of the pacemaker for symptomatic bradycardia myoview 2016 Low risk stress nuclear study with small, mild, fixed anteroseptal and inferior defects (possible soft tissue attenuation); no ischemia. Since pacemaker placement patient denies any cardiac symptoms  He remains physically active, currently living for the summer in  [place]. He is retired Doctor, general practice in [place]   Current Activity/ Functional Status:  Patient is independent with mobility/ambulation, transfers, ADL's, IADL's.   Zubrod Score: At the time of surgery this patient's most appropriate activity status/level should be described as: [x]     0    Normal activity, no symptoms []     1    Restricted in physical strenuous activity but ambulatory, able to do out light work []     2    Ambulatory and capable of self care, unable to do work activities, up and about               >50 % of waking hours                              []     3    Only limited self care, in bed greater than 50% of waking hours []     4    Completely disabled, no self care, confined to bed or chair []     5    Moribund   Past Medical History:  Diagnosis Date  . Allergy   . Arthritis    "right knee and right thumb" (12/19/2014)  . Asthma    pt denies  . Bronchitis 07/2016   started in December and continued about 2 months  .  Esophageal stricture   . GERD (gastroesophageal reflux disease)   . Hemochromatosis   . Hypercalcemia   . Hypercholesteremia   . Hypertension   . Osteoarthritis   . Palpitations   . Presence of permanent cardiac pacemaker    hx bradycardia  . Retinal artery occlusion, branch    "right eye"  . Second degree AV block, Mobitz type II   . Skin cancer    right shoulder  . Stroke [facility]) ~ 2011   "right eye"/ partial blindness    Past Surgical History:  Procedure Laterality Date  . CATARACT EXTRACTION, BILATERAL  01/2017  . COLONOSCOPY    . EP IMPLANTABLE DEVICE N/A 12/19/2014   Procedure: Pacemaker Implant;  Surgeon: Alexander Sprang, MD;  Location: [place] [facility];  Service: Cardiovascular;  Laterality: N/A;  . ESOPHAGOGASTRODUODENOSCOPY (EGD) WITH ESOPHAGEAL DILATION  X 3  . EYE SURGERY Bilateral 2017  . INGUINAL HERNIA REPAIR Left 1980's  . INSERT / REPLACE / REMOVE PACEMAKER  12/19/2014  . JOINT REPLACEMENT Right 2016  . KNEE  ARTHROSCOPY Right ~ 1982; ~ 1992  . MOHS SURGERY Right ~ 2014   "pre-melanoma scapula"  . POSTERIOR TIBIAL TENDON REPAIR Left 2012  . TONSILLECTOMY  1946  . TOTAL KNEE ARTHROPLASTY Right 05/20/2015   Procedure: RIGHT TOTAL KNEE ARTHROPLASTY;  Surgeon: Alexander Cancel, MD;  Location: [facility];  Service: Orthopedics;  Laterality: Right;  . UPPER GASTROINTESTINAL ENDOSCOPY      Family History  Problem Relation Age of Onset  . Colon cancer Mother        dx in her 6s  . Heart disease Mother   . Alzheimer's disease Mother   . Colon cancer Father        dx in his early 28s  . Heart disease Father   . Prostate cancer Father   . Prostate cancer Brother        1/2 brother  . Hemochromatosis Brother   . Prostate cancer Brother   . Esophageal cancer Neg Hx   . Pancreatic cancer Neg Hx   . Stomach cancer Neg Hx   . Liver disease Neg Hx      Social History   Tobacco Use  Smoking Status Former Smoker  . Years: 3.00  . Types: Cigarettes  . Quit date: 05/16/1962  . Years since quitting: 57.8  Smokeless Tobacco Never Used  Tobacco Comment   "quit smoking in the 1960's"; was an intermittent smoker     Social History   Substance and Sexual Activity  Alcohol Use No   Comment: 12/19/2014 "stopped drinking in ~ 2012"     No Known Allergies  Current Facility-Administered Medications  Medication Dose Route Frequency Provider Last Rate Last Admin  . ceFAZolin (ANCEF) IVPB 2g/100 mL premix  2 g Intravenous 30 min Pre-Op Alexander Isaac, MD      . lactated ringers infusion   Intravenous Continuous Alexander Mouse, MD       Currently the patient notes that he has not taking his Plavix  Pertinent items are noted in HPI.   Review of Systems:     Cardiac Review of Systems: [Y] = yes  or   [ N ] = no   Chest Pain [  n  ]  Resting SOB [ n  ] Exertional SOB  [y on 4 th flight of stairs   ]  Vertell Limber Florencio.Murray  ]   Pedal Edema [  n ]    Palpitations [  n ] Syncope  [n  ]   Presyncope [  n  ]   General Review of Systems: [Y] = yes [  ]=no Constitional: recent weight change [ n ];  Wt loss over the last 3 months [   ] anorexia [  ]; fatigue [  ]; nausea [  ]; night sweats [  ]; fever [  ]; or chills [  ];           Eye : blurred vision [  ]; diplopia [   ]; vision changes [  ];  Amaurosis fugax[  ]; Resp: cough [  ];  wheezing[  ];  hemoptysis[  ]; shortness of breath[  ]; paroxysmal nocturnal dyspnea[  ]; dyspnea on exertion[  ]; or orthopnea[  ];  GI:  gallstones[  ], vomiting[  ];  dysphagia[  ]; melena[  ];  hematochezia [  ]; heartburn[  ];   Hx of  Colonoscopy[  ]; GU: kidney stones [  ]; hematuria[  ];   dysuria [  ];  nocturia[  ];  history of     obstruction [  ]; urinary frequency [  ]             Skin: rash, swelling[  ];, hair loss[  ];  peripheral edema[  ];  or itching[  ]; Musculosketetal: myalgias[  ];  joint swelling[  ];  joint erythema[  ];  joint pain[  ];  back pain[  ];  Heme/Lymph: bruising[  ];  bleeding[  ];  anemia[  ];  Neuro: TIA[  ];  headaches[  ];  stroke[  ];  vertigo[  ];  seizures[  ];   paresthesias[  ];  difficulty walking[  ];  Psych:depression[  ]; anxiety[  ];  Endocrine: diabetes[  ];  thyroid dysfunction[  ];  Immunizations: Flu up to date [ y ]; Pneumococcal up to date [ y ]; civid  [ Y]  Other: Patient bothered by postnasal drip in the past history of cataract surgery     PHYSICAL EXAMINATION: BP (!) 146/80   Pulse 82   Temp 97.7 F (36.5 C)   Resp 17   Ht 5\' 10"  (1.778 m)   Wt 72.8 kg   SpO2 100%   BMI 23.03 kg/m  General appearance: alert, cooperative, appears stated age and no distress Head: Normocephalic, without obvious abnormality, atraumatic Neck: no adenopathy, no carotid bruit, no JVD, supple, symmetrical, trachea midline and thyroid not enlarged, symmetric, no tenderness/mass/nodules Lymph nodes: Cervical, supraclavicular, and axillary nodes normal. Resp: clear to auscultation bilaterally Cardio: regular rate and  rhythm, S1, S2 normal, no murmur, click, rub or gallop GI: soft, non-tender; bowel sounds normal; no masses,  no organomegaly Extremities: extremities normal, atraumatic, no cyanosis or edema Neurologic: Grossly normal  Diagnostic Studies & Laboratory data:     Recent Radiology Findings:  DG Chest 2 View  Result Date: 03/08/2020 CLINICAL DATA:  Preop thoracoscopy bronchoscopy right lung nodule EXAM: CHEST - 2 VIEW COMPARISON:  PET CT 02/09/2020, radiograph 12/22/2016 FINDINGS: Left-sided pacing device with leads over right atrium and right ventricle. Mild cardiomegaly. Crescent shaped calcification over the base of the heart consistent with pericardial effusion and likely related to prior pericarditis. Aortic atherosclerosis. No pneumothorax. Vague opacity in the right lower lung may correspond to the lung nodule. Severe chronic compression fracture at T11. IMPRESSION: 1. Cardiomegaly without edema or acute airspace disease. Pericardial calcification probably related to prior pericarditis. 2.  Vague opacity in the right lower lung may correspond to lung nodule seen on CT. Electronically Signed   By: Donavan Foil M.D.   On: 03/08/2020 16:44   PROCEDURE: CT CHEST WITH CONTRAST  HISTORY: Pulmonary nodule  COMPARISON: Chest radiographs 01/16/2020 and 01/12/2020  TECHNIQUE: Contiguous axial images were obtained from the thoracic inlet through lung bases after the administration of intravenous contrast.  FINDINGS:  The thyroid is slightly atrophic.  No pathologically enlarged supraclavicular, axillary, mediastinal, or hilar lymph nodes are seen.  There is no pleural or pericardial effusion. There are pericardial calcifications consistent with prior pericarditis. The heart is enlarged. There is a left anterior chest wall pacing device with right atrial andright ventricular leads.  There are mild changes of emphysema. There is septal thickening in both lungs, suggesting early changes of pulmonary  fibrosis. No overt honeycombing is present and there is no evidence of traction bronchiectasis.  Thereis a pulmonary nodule in the superior segment of the right lower lobe measuring 1.8 x 1.4 x 1.2 cm. This is concerning for a malignant process. This corresponds in size and location with the finding on recent chest radiographs. There are scattered areasof pleural thickening.  There is limited imaging of the upper abdomen. No acute upper abdominal abnormality is identified. There is a cyst in the medial upper pole of the left kidney.  No acute osseous abnormality is identified. There is a compression fracture with greater than 50% loss of height at T11, stable compared to previous radiographs.  IMPRESSION:  1. There is a pulmonary nodule in the peripheral superior segment of the right lower lobe. This measures up to 1.8 cm and is suspicious for a malignant process. It is amenable to percutaneous CT-guided biopsy. PET CT could also further characterize.  2. No evidence of lymphadenopathy in the thorax.  3. Evidence of mild emphysema with septal thickening and scattered areas of pleural thickening in both lungs concerning for changes of early fibrosis. There is no honeycombing or traction bronchiectasis.  4. Cardiomegaly.  5. 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  6. Smooth curvilinear pericardial calcifications are compatible with the sequelae of previous pericarditis.   Signed (Electronic Signature): 01/22/2020 2:40 PM EDT  Signed By: Tawni Pummel, MD Specimen Collected: 01/22/20 2:31 PM Last Resulted: 01/22/20 2:40 PM  Received From: [facility] PET Image Initial (PI) Skull Base To Thigh  Result Date: 02/11/2020 CLINICAL DATA:  Initial treatment strategy for right lower lobe pulmonary nodule. EXAM: NUCLEAR MEDICINE PET SKULL BASE TO THIGH TECHNIQUE: 8.7 mCi F-18 FDG was injected intravenously. Full-ring PET imaging was performed from the skull base to thigh after  the radiotracer. CT data was obtained and used for attenuation correction and anatomic localization. Fasting blood glucose: 93 mg/dl COMPARISON:  None FINDINGS: Mediastinal blood pool activity: SUV max 2.74 Liver activity: SUV max NA NECK: No hypermetabolic lymph nodes in the neck. Incidental CT findings: Carotid artery calcifications. CHEST: The 18 mm right lower lobe pulmonary lesion is hypermetabolic. SUV max is 7.57. This is consistent with a primary lung neoplasm. The 10 mm right lower lobe pulmonary nodule more inferiorly does not show any hypermetabolism and is likely benign. No enlarged or hypermetabolic mediastinal or hilar lymph nodes to suggest metastatic adenopathy. Incidental CT findings: Emphysematous changes and probable peripheral interstitial lung disease. Advanced atherosclerotic calcifications involving the aorta and coronary arteries. Thick calcification of the pericardium likely the sequela of prior pericarditis. This could certainly  be constrictive. ABDOMEN/PELVIS: No abnormal hypermetabolic activity within the liver, pancreas, adrenal glands, or spleen. No hypermetabolic lymph nodes in the abdomen or pelvis. Incidental CT findings: Severe/advanced atherosclerotic calcifications involving the abdominal aorta and iliac arteries without aneurysm. Simple left renal cyst is noted. Prostate gland enlargement with median lobe hypertrophy impressing on the base of the bladder. SKELETON: No focal hypermetabolic activity to suggest skeletal metastasis. Incidental CT findings: none IMPRESSION: 1. 18 mm superior segment right lower lobe nodule is hypermetabolic and consistent with primary lung neoplasm. 2. 10 mm right lower lobe nodule more inferiorly is not hypermetabolic and likely benign. 3. No enlarged or hypermetabolic mediastinal or hilar lymph nodes to suggest metastatic adenopathy. 4. No findings for abdominal/pelvic metastatic disease or osseous metastatic disease. 5. Thick calcification of the  pericardium likely the sequela of prior pericarditis. 6. Advanced atherosclerotic calcifications involving the thoracic and abdominal aorta and branch vessels including three-vessel coronary artery calcifications. Electronically Signed   By: Marijo Sanes M.D.   On: 02/11/2020 16:05     I have independently reviewed the above radiology studies  and reviewed the findings with the patient.   Recent Lab Findings: Lab Results  Component Value Date   WBC 5.5 03/08/2020   HGB 14.0 03/08/2020   HCT 43.0 03/08/2020   PLT PLATELET CLUMPS NOTED ON SMEAR, UNABLE TO ESTIMATE 03/08/2020   GLUCOSE 90 03/08/2020   ALT 23 03/08/2020   AST 31 03/08/2020   NA 139 03/08/2020   K 4.0 03/08/2020   CL 107 03/08/2020   CREATININE 0.87 03/08/2020   BUN 11 03/08/2020   CO2 23 03/08/2020   TSH 1.95 12/05/2014   INR 1.4 (H) 03/08/2020   PFT's  FEV1 2.63   93% DLCO 14.25 58% Conclusions: The reduced lung volumes, increased FEV1/FVC ratio and diffusion defect suggest an interstitial process such as fibrosis or interstitial inflammation. Anemia cannot be excluded as a potential cause of the diffusion defect without correcting the observed diffusing capacity for hemoglobin. In view of the severity of the diffusion defect, studies with exercise would be helpful to evaluate the presence of hypoxemia. Pulmonary Function Diagnosis: Mild Restriction -Interstitial Moderate Diffusion Defect   Assessment / Plan:   #1  1.8 cm hypermetabolic mass superior segment right lower lobe-in non-smoker-with the right lower lobe lesion being hypermetabolic increasing the likelihood that this may be malignant I discussed with the patient various diagnostic and treatment options. Needle biopsy could be done but if positive or negative we would recommend surgical resection-possibly robotic superior segmentectomy and lymph node dissection.  The patient prefers to avoid biopsy and proceed with primary surgery-  The goals risks and  alternatives of the planned surgical procedure Procedure(s): XI ROBOTIC ASSISTED THORACOSCOPY-SEGMENTECTOMY (Right) VIDEO BRONCHOSCOPY (N/A)  have been discussed with the patient in detail. The risks of the procedure including death, infection, stroke, myocardial infarction, bleeding, blood transfusion have all been discussed specifically.  I have quoted [name] a 1 % of perioperative mortality and a complication rate as high as 30 %. The patient's questions have been answered.Lincon Sahlin is willing  to proceed with the planned procedure. Alexander Isaac MD      [address] [place] [address] Office [phone]     03/11/2020 7:15 AM

## 2020-03-11 NOTE — Transfer of Care (Signed)
Immediate Anesthesia Transfer of Care Note  Patient: [name]  Procedure(s) Performed: XI ROBOTIC ASSISTED THORACOSCOPY-RIGHT SUPERIOR SEGMENTECTOMY WITH NODE SAMPLES (Right Chest) VIDEO BRONCHOSCOPY (N/A ) INTERCOSTAL NERVE BLOCK (Right )  Patient Location: PACU  Anesthesia Type:General  Level of Consciousness: awake and alert   Airway & Oxygen Therapy: Patient Spontanous Breathing and Patient connected to nasal cannula oxygen  Post-op Assessment: Report given to RN and Post -op Vital signs reviewed and stable  Post vital signs: Reviewed and stable  Last Vitals:  Vitals Value Taken Time  BP 99/57 03/11/20 1023  Temp    Pulse 60 03/11/20 1027  Resp 12 03/11/20 1027  SpO2 100 % 03/11/20 1027  Vitals shown include unvalidated device data.  Last Pain:  Vitals:   03/11/20 0627  PainSc: 0-No pain         Complications: No complications documented.

## 2020-03-11 NOTE — Anesthesia Procedure Notes (Signed)
Procedure Name: Intubation Date/Time: 03/11/2020 7:42 AM Performed by: Wilburn Cornelia, CRNA Pre-anesthesia Checklist: Patient identified, Emergency Drugs available, Suction available, Patient being monitored and Timeout performed Patient Re-evaluated:Patient Re-evaluated prior to induction Oxygen Delivery Method: Circle system utilized Preoxygenation: Pre-oxygenation with 100% oxygen Laryngoscope Size: Miller and 2 Grade View: Grade II Endobronchial tube: Left, Double lumen EBT, EBT position confirmed by auscultation and EBT position confirmed by fiberoptic bronchoscope and 39 Fr Number of attempts: 1 Airway Equipment and Method: Stylet Placement Confirmation: positive ETCO2,  CO2 detector,  breath sounds checked- equal and bilateral and ETT inserted through vocal cords under direct vision Tube secured with: Tape Dental Injury: Teeth and Oropharynx as per pre-operative assessment

## 2020-03-12 ENCOUNTER — Inpatient Hospital Stay (HOSPITAL_COMMUNITY): Payer: Medicare Other

## 2020-03-12 ENCOUNTER — Other Ambulatory Visit: Payer: Self-pay

## 2020-03-12 ENCOUNTER — Encounter (HOSPITAL_COMMUNITY): Payer: Self-pay | Admitting: Cardiothoracic Surgery

## 2020-03-12 DIAGNOSIS — C3431 Malignant neoplasm of lower lobe, right bronchus or lung: Secondary | ICD-10-CM

## 2020-03-12 HISTORY — DX: Malignant neoplasm of lower lobe, right bronchus or lung: C34.31

## 2020-03-12 LAB — CBC
HCT: 38.9 % — ABNORMAL LOW (ref 39.0–52.0)
Hemoglobin: 13 g/dL (ref 13.0–17.0)
MCH: 30.9 pg (ref 26.0–34.0)
MCHC: 33.4 g/dL (ref 30.0–36.0)
MCV: 92.4 fL (ref 80.0–100.0)
Platelets: 103 10*3/uL — ABNORMAL LOW (ref 150–400)
RBC: 4.21 MIL/uL — ABNORMAL LOW (ref 4.22–5.81)
RDW: 14.4 % (ref 11.5–15.5)
WBC: 9.6 10*3/uL (ref 4.0–10.5)
nRBC: 0 % (ref 0.0–0.2)

## 2020-03-12 LAB — BASIC METABOLIC PANEL
Anion gap: 11 (ref 5–15)
BUN: 16 mg/dL (ref 8–23)
CO2: 24 mmol/L (ref 22–32)
Calcium: 10 mg/dL (ref 8.9–10.3)
Chloride: 98 mmol/L (ref 98–111)
Creatinine, Ser: 1 mg/dL (ref 0.61–1.24)
GFR calc Af Amer: >60 mL/min (ref >60)
GFR calc non Af Amer: >60 mL/min (ref >60)
Glucose, Bld: 162 mg/dL — ABNORMAL HIGH (ref 70–99)
Potassium: 3.7 mmol/L (ref 3.5–5.1)
Sodium: 133 mmol/L — ABNORMAL LOW (ref 135–145)

## 2020-03-12 LAB — SURGICAL PATHOLOGY

## 2020-03-12 IMAGING — DX DG CHEST 1V PORT
1 series · 1 of 1 positions shown · non-contrast
Comparison: [DATE]

CLINICAL DATA: Postop from right lower lobectomy.

EXAM:
PORTABLE CHEST 1 VIEW

[chest]
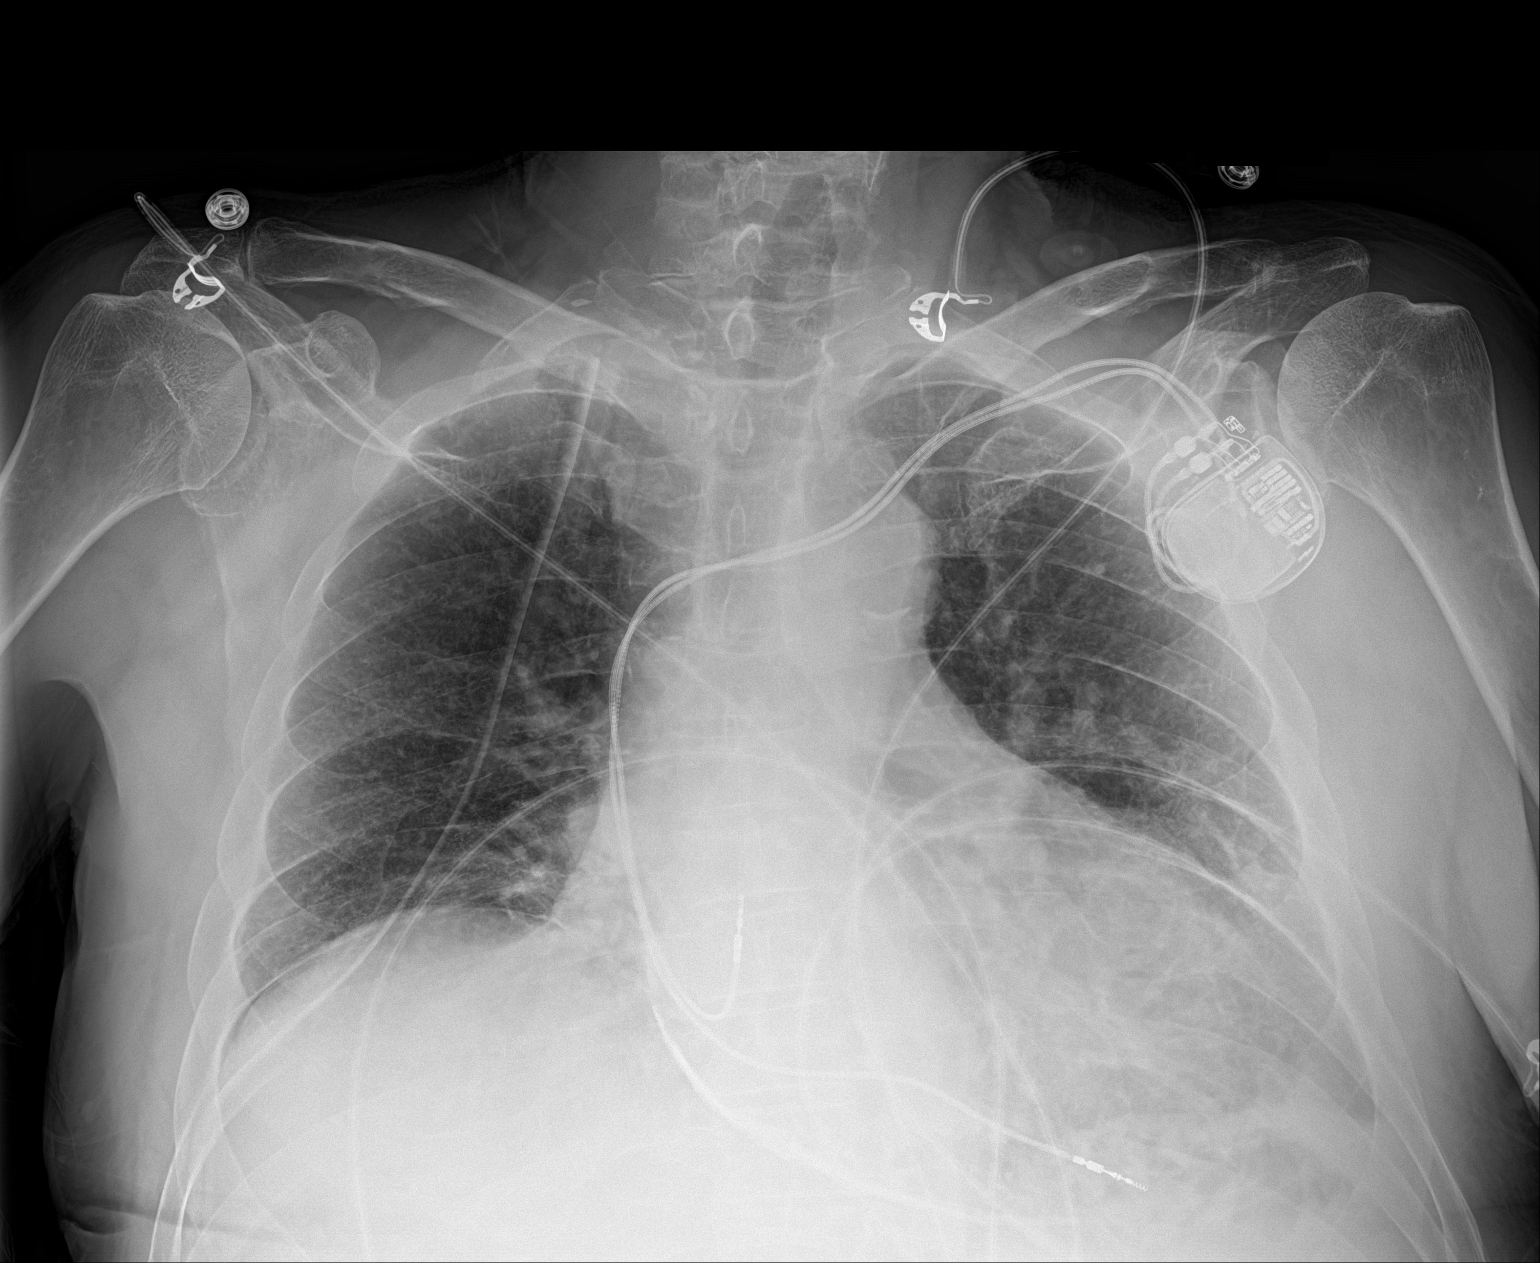

[1 of 1 positions shown; findings below may reference images not displayed]

FINDINGS: Right-sided chest tube remains in place. A tiny approximately 5-10%
right basilar pneumothorax is seen. Mild right basilar atelectasis
is noted. No evidence of pulmonary consolidation or pleural
effusion. Heart size is stable.
IMPRESSION: Tiny approximately 5-10% right basilar pneumothorax.

Mild right basilar atelectasis.

## 2020-03-12 MED ORDER — ENOXAPARIN SODIUM 30 MG/0.3ML ~~LOC~~ SOLN
30.0000 mg | SUBCUTANEOUS | Status: DC
  Filled 2020-03-12 (×2): qty 0.3

## 2020-03-12 NOTE — Discharge Instructions (Signed)
Video-Assisted Thoracic Surgery, Care After This sheet gives you information about how to care for yourself after your procedure. Your health care provider may also give you more specific instructions. If you have problems or questions, contact your health care provider. What can I expect after the procedure? After the procedure, it is common to have:  Some pain and soreness in your chest.  Pain when breathing in (inhaling) and coughing.  Constipation.  Fatigue.  Difficulty sleeping. Follow these instructions at home: Preventing pneumonia  Take deep breaths or do breathing exercises as instructed by your health care provider. Doing this helps prevent lung infection (pneumonia).  Cough frequently. Coughing may cause discomfort, but it is important to clear mucus (phlegm) and expand your lungs. If it hurts to cough, hold a pillow against your chest or place the palms of both hands on top of the incision (use splinting) when you cough. This may help relieve discomfort.  If you were given an incentive spirometer, use it as directed. An incentive spirometer is a tool that measures how well you are filling your lungs with each breath.  Participate in pulmonary rehabilitation as directed by your health care provider. This is a program that combines education, exercise, and support from a team of specialists. The goal is to help you heal and get back to your normal activities as soon as possible. Medicines  Take over-the-counter or prescription medicines only as told by your health care provider.  If you have pain, take pain-relieving medicine before your pain becomes severe. This is important because if your pain is under control, you will be able to breathe and cough more comfortably.  If you were prescribed an antibiotic medicine, take it as told by your health care provider. Do not stop taking the antibiotic even if you start to feel better. Activity  Ask your health care provider what  activities are safe for you.  Avoid activities that use your chest muscles for at least 3-4 weeks.  Do not lift anything that is heavier than 10 lb (4.5 kg), or the limit that your health care provider tells you, until he or she says that it is safe. Incision care  Follow instructions from your health care provider about how to take care of your incision(s). Make sure you: ? Wash your hands with soap and water before you change your bandage (dressing). If soap and water are not available, use hand sanitizer. ? Change your dressing as told by your health care provider. ? Leave stitches (sutures), skin glue, or adhesive strips in place. These skin closures may need to stay in place for 2 weeks or longer. If adhesive strip edges start to loosen and curl up, you may trim the loose edges. Do not remove adhesive strips completely unless your health care provider tells you to do that.  Keep your dressing dry until it has been removed.  Check your incision area every day for signs of infection. Check for: ? Redness, swelling, or pain. ? Fluid or blood. ? Warmth. ? Pus or a bad smell. Bathing  Do not take baths, swim, or use a hot tub until your health care provider approves. You may take showers.  After your dressing has been removed, use soap and water to gently wash your incision area. Do not use anything else to clean your incision(s) unless your health care provider tells you to do this. Driving   Do not drive until your health care provider approves.  Do not drive or  use heavy machinery while taking prescription pain medicine. Eating and drinking  Eat a healthy, balanced diet as instructed by your health care provider. A healthy diet includes plenty of fresh fruits and vegetables, whole grains, and low-fat (lean) proteins.  Limit foods that are high in fat and processed sugars, such as fried and sweet foods.  Drink enough fluid to keep your urine clear or pale yellow. General  instructions   To prevent or treat constipation while you are taking prescription pain medicine, your health care provider may recommend that you: ? Take over-the-counter or prescription medicines. ? Eat foods that are high in fiber, such as beans, fresh fruits and vegetables, and whole grains.  Do not use any products that contain nicotine or tobacco, such as cigarettes and e-cigarettes. If you need help quitting, ask your health care provider.  Avoid secondhand smoke.  Wear compression stockings as told by your health care provider. These stockings help to prevent blood clots and reduce swelling in your legs.  If you have a chest tube, care for it as instructed by your health care provider. Do not travel by airplane during the 2 weeks after your chest tube is removed, or until your health care provider says that this is safe.  Keep all follow-up visits as told by your health care provider. This is important. Contact a health care provider if:  You have redness, swelling, or pain around an incision.  You have fluid or blood coming from an incision.  Your incision area feels warm to the touch.  You have pus or a bad smell coming from an incision.  You have a fever or chills.  You have nausea or vomiting.  You have pain that does not get better with medicine. Get help right away if:  You have chest pain.  Your heart is fluttering or beating rapidly.  You develop a rash.  You have shortness of breath or trouble breathing.  You are confused.  You have trouble speaking.  You feel weak, light-headed, or dizzy.  You faint. Summary  To help prevent lung infection (pneumonia), take deep breaths or do breathing exercises as instructed by your health care provider.  Cough frequently to clear mucus (phlegm) and expand your lungs. If it hurts to cough, hold a pillow against your chest or place the palms of both hands on top of the incision (use splinting) when you cough.  If  you have pain, take pain-relieving medicine before your pain becomes severe. This is important because if your pain is under control, you will be able to breathe and cough more comfortably.  Ask your health care provider what activities are safe for you. This information is not intended to replace advice given to you by your health care provider. Make sure you discuss any questions you have with your health care provider. Document Revised: 07/02/2017 Document Reviewed: 06/29/2016 Elsevier Patient Education  2020 [employer].

## 2020-03-12 NOTE — Progress Notes (Addendum)
      [address]       [place] [address]             [phone]      1 Day Post-Op Procedure(s) (LRB): XI ROBOTIC ASSISTED THORACOSCOPY-RIGHT SUPERIOR SEGMENTECTOMY WITH NODE SAMPLES (Right) VIDEO BRONCHOSCOPY (N/A) INTERCOSTAL NERVE BLOCK (Right)   Subjective:  Up in chair, doing pretty well.  He requested foley be removed yesterday in PACU after surgery.  He is voiding without difficulty accept for some burning which has improved.  His pain is well controlled with Tylenol.  Denies N/V.  Objective: Vital signs in last 24 hours: Temp:  [97.7 F (36.5 C)-98.8 F (37.1 C)] 98.8 F (37.1 C) (08/10 0408) Pulse Rate:  [60-88] 60 (08/10 0408) Cardiac Rhythm: Ventricular paced (08/09 2012) Resp:  [11-26] 14 (08/10 0408) BP: (95-136)/(57-89) 112/61 (08/10 0408) SpO2:  [95 %-100 %] 96 % (08/10 0408) Arterial Line BP: (101-172)/(41-75) 165/64 (08/09 1353)  Intake/Output from previous day: 08/09 0701 - 08/10 0700 In: 2849.2 [P.O.:120; I.V.:2549.2; IV Piggyback:180] Out: 1293 [Urine:1025; Blood:20; Chest Tube:248]  General appearance: alert, cooperative and no distress Heart: regular rate and rhythm Lungs: clear to auscultation bilaterally Abdomen: soft, non-tender; bowel sounds normal; no masses,  no organomegaly Extremities: extremities normal, atraumatic, no cyanosis or edema Wound: clean and dry  Lab Results: Recent Labs    03/11/20 1012 03/12/20 0037  WBC  --  9.6  HGB  --  13.0  HCT  --  38.9*  PLT 134* 103*   BMET:  Recent Labs    03/12/20 0037  NA 133*  K 3.7  CL 98  CO2 24  GLUCOSE 162*  BUN 16  CREATININE 1.00  CALCIUM 10.0    PT/INR:  Recent Labs    03/11/20 1012  LABPROT 14.4  INR 1.2   ABG    Component Value Date/Time   PHART 7.425 03/08/2020 1400   HCO3 23.4 03/08/2020 1400   ACIDBASEDEF 0.4 03/08/2020 1400   O2SAT 98.4 03/08/2020 1400   CBG (last 3)  No results for input(s): GLUCAP in the last 72  hours.  Assessment/Plan: S/P Procedure(s) (LRB): XI ROBOTIC ASSISTED THORACOSCOPY-RIGHT SUPERIOR SEGMENTECTOMY WITH NODE SAMPLES (Right) VIDEO BRONCHOSCOPY (N/A) INTERCOSTAL NERVE BLOCK (Right)  1. CV- NSR, BP controlled- continue home Norvasc, [place] 2. Pulm- CT w/o air leak, 248 cc output since surgery, CXR w/o pneumothorax.. will place chest tube to water seal 3. Renal-creatinine stable, K is at 3.7, stop IV fluids as patient eating/drinking without difficulty 4. Lovenox for DVT prophylaxis 5. Dispo- patient stable, CT to water seal, repeat CXR in AM, continue current care    LOS: 1 day    Alexander Handler, PA-C 03/12/2020  comfortable not require narcotics  Ct to water seal today and poss removal tomorrow  Will ambulate in hall today I have seen and examined Alexander Murray and agree with the above assessment  and plan.  Grace Isaac MD Beeper [phone] Office [phone] 03/12/2020 10:31 AM

## 2020-03-12 NOTE — Op Note (Signed)
Alexander Murray, LABELL MEDICAL RECORD [id number] ACCOUNT [id number] DATE OF BIRTH:September 21, 1940 FACILITY: [facility] LOCATION: [facility] PHYSICIAN:Taline Nass Maryruth Bun, MD  OPERATIVE REPORT  DATE OF PROCEDURE:  03/11/2020  PREOPERATIVE DIAGNOSIS:  Lung nodule superior segment right lower lobe.  POSTOPERATIVE DIAGNOSIS:  Neuroendocrine tumor by frozen section.  Final path pending.  PROCEDURE PERFORMED:  Video bronchoscopy, robotic-assisted right superior segmentectomy with lymph node sampling and intercostal nerve block.  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  This led to a chest x-ray and ultimately a CT scan and PET scan of the chest.  CT scan showed a 1.5 cm nodule  peripherally located in the superior segment of the right lower lobe that was hypermetabolic.  There were no associated mediastinal lymph nodes that were enlarged or hypermetabolic.  Initial CT scan questioned 2 nodules in the right lower lobe.   Subsequent PET scan did not demonstrate this nor did review by the Multidisciplinary Thoracic Oncology conference.  Although the mass was amenable to needle biopsy, after discussion with the patient, it was decided to proceed with primary resection.  The  patient's FEV1 was fairly normal.  He did have decreased diffusion capacity.  Risks and options were discussed with him and he was agreeable with proceeding.  DESCRIPTION OF PROCEDURE:  The patient with an IVs and arterial line in place was placed in the operating room in supine position.  A single lumen endotracheal tube was placed by anesthesia.  Appropriate timeout was performed and we proceeded to the  subsegmental level with video bronchoscopy both the right and left tracheobronchial tree without any endobronchial lesions appreciated.  The scope was removed and a  double-lumen endotracheal tube was placed.  The patient was then turned in lateral  decubitus position with the right side up.  The right side had been previously marked.  Right chest was prepped with Betadine, draped in a sterile manner.  A 2nd timeout was performed.  We then infiltrated solution of saline, Exparel, bupivacaine at the  sites of port insertion.  Approximately the 9th intercostal space at the mid axillary line small incision was made and the right chest was entered.  The lung was relatively free.  Mild insufflation was started.  This gave good visualization and we moved  posteriorly along the same interspace approximately 4 cm and placed under direct vision, a 12 mm port and more laterally and posteriorly an 8 mm port.  We then moved anteriorly and placed a 12 mm port anteriorly.  An accessory port was placed under  direct vision, 2 intercostal spaces below and more posterior than the camera port.  With the ports, all in place and good visualization we then brought the [employer] robot into position.  The lesion with the scope attached was targeted.  We then docked  the remaining ports.  Instruments were then placed through the ports with a bipolar grasper in port 1, camera in port 2, Cartier in port 3, and a tip up grasper in port 4.  We then initially did a visual inspection of the chest.  There was no evidence of  pleural disease on the parietal pleura.  On the visceral pleura there were multiple very small firm appearing whitish nodules.  In addition, in the superior segment peripherally located, the lesion in question was easily identified and located in the  superior segment.  We initially proceeded with the robotic exposure of the inferior pulmonary ligament, dividing this up to the inferior pulmonary vein, and taking lymph nodes.  The lymph nodes in this area were slightly enlarged.  Frozen section on  those showed anthropathic nodal tissue, but no evidence of malignancy.  We then  continued with the dissection, freeing the major fissure, which was complete.  This gave good exposure of our lesion and we proceeded with removal of the superior segment of  the right lower lobe.  This was primarily accomplished with serial firing of blue load staplers.  The specimen was then placed in a bag and removed and sent to pathology for frozen section.  While waiting for the frozen section, we proceeded with  additional lymph node sampling of 10 and 11R lymph nodes which were very small and did not appear pathologic.  The frozen section showed clear margins and a malignancy, but this could not be fully characterized, but primary thought of the pathologist at  the time was that this was a neuroendocrine tumor.  We then went removing the small cigar gauzes and then undocked the robot using the camera.  We then placed intercostal nerve blocks along the posterior ribs.  The most anterior port was used to place a  28 Blake drain and the remaining ports were closed.  The lung reinflated well with no air leak.  Blood loss was less than 50 mL.  The patient was extubated in the operating room, transferred to the recovery room for postoperative care.  Sponge and needle  count was reported as correct.  CN/NUANCE  D:03/12/2020 T:03/12/2020 JOB:[id number]

## 2020-03-12 NOTE — Discharge Summary (Signed)
Physician Discharge Summary  Patient ID: Alexander Murray MRN: [id number] DOB/AGE: [birth year] 79 y.o.  Admit date: 03/11/2020 Discharge date: 03/13/2020  Admission Diagnoses:  Patient Active Problem List   Diagnosis Date Noted  . Postnasal drip 10/13/2016  . S/P right TKA 05/20/2015  . S/P knee replacement 05/20/2015  . Central retinal artery occlusion 06/26/2013   Discharge Diagnoses:   Patient Active Problem List   Diagnosis Date Noted  . Small cell lung cancer, right lower lobe ([place]) 03/12/2020  . S/P partial lobectomy of lung 03/11/2020  . Postnasal drip 10/13/2016  . S/P right TKA 05/20/2015  . S/P knee replacement 05/20/2015  . Central retinal artery occlusion 06/26/2013   Discharged Condition: good  History of Present Illness:  Alexander Murray in an 79 yo white male found to have a right lower lobe lung nodule on CT scan performed at OSH.  The patient underwent a recent endoscopy.  Since that time he has had a persistent cough and there was concerned he possibly aspirated during the procedure.  He sought evaluation which consisted of CXR and subsequent CT scan which revealed the pulmonary nodule.  The patient underwent further workup with PET CT scan which showed the patients nodule to be hypermetabolic and felt to most likely be a primary lung neoplasm.  He was referred to [facility] for surgical resection.  He was evaluated by Dr. Servando Snare at which time patient admitted to remaining physically active.  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  He has history of symptomatic bradycardia and has a PPM for this.  He denies any cardiac symptoms.  It was felt the patient would be a surgical candidate.  The risks and benefits of the procedure were explained to the patient and he was agreeable to proceed.  Hospital Course:   Alexander Murray presented to [facility] on 03/11/2020.  He was taken to the operating room and underwent  Robotic Assisted Right Side Video Thoracoscopy with superior segmentectomy of right lower lobe, lymph node dissection, and intercostal nerve block.  He tolerated the procedure without difficulty, was extubated and taken to the PACU in stable condition.  His foley catheter was removed per his request.  He is voiding without difficulty post removal.  His chest tube is free from air leak.  Follow up CXR shows no evidence of pneumothorax.  His chest tube was placed on water seal on POD #1.  Follow up CXR showed no evidence of pneumothorax.  His chest tube was removed on 03/12/2020.  Head CT scan was obtained to check for brain metastasis due to PPM in place an inability to perform MRI.  This showed no evidence of metastasis on non-contrast CT scan. He has been restarted on his home anti-hypertensive agents.  He is ambulating without difficulty. His surgical incisions are healing without evidence of infection.  He is medically stable for discharge home today.   Significant Diagnostic Studies: nuclear medicine:   IMPRESSION: 1. 18 mm superior segment right lower lobe nodule is hypermetabolic and consistent with primary lung neoplasm. 2. 10 mm right lower lobe nodule more inferiorly is not hypermetabolic and likely benign. 3. No enlarged or hypermetabolic mediastinal or hilar lymph nodes to suggest metastatic adenopathy. 4. No findings for abdominal/pelvic metastatic disease or osseous metastatic disease. 5. Thick calcification of the pericardium likely the sequela of prior pericarditis. 6. Advanced atherosclerotic calcifications involving the thoracic and abdominal aorta and branch vessels including three-vessel coronary artery  calcifications.  PATHOLOGY:  SURGICAL PATHOLOGY   THIS IS AN ADDENDUM REPORT   CASE: [id number]  PATIENT: Alexander Murray  Surgical Pathology Report   Addendum  Reason for Addendum #1: Report Clarification   Clinical History: pulmonary nodule (cm)    FINAL  MICROSCOPIC DIAGNOSIS:   A. LYMPH NODE, 8, EXCISION:  - Lymph node, negative for carcinoma (0/1)   B. 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 LYMPH NODE, 10R, EXCISION:  - Lymph node, negative for carcinoma (0/1)   D. LYMPH NODE, 11, EXCISION:  - Fragments of lymph node, negative for carcinoma (0/1  Treatments: surgery:    Video bronchoscopy, robotic-assisted right superior segmentectomy with lymph node sampling and intercostal nerve block.  Discharge Exam: Blood pressure (!) 146/74, pulse 70, temperature 98.7 F (37.1 C), temperature source Oral, resp. rate (!) 21, height 5\' 10"  (1.778 m), weight 72.8 kg, SpO2 98 %.  General appearance: alert, cooperative and no distress Heart: regular rate and rhythm Lungs: clear to auscultation bilaterally Abdomen: soft, non-tender; bowel sounds normal; no masses,  no organomegaly Extremities: extremities normal, atraumatic, no cyanosis or edema Wound: clean and dry  Discharge medications:  Allergies as of 03/13/2020   No Known Allergies     Medication List    TAKE these medications   acetaminophen 325 MG tablet Commonly known as: TYLENOL Take 2 tablets (650 mg total) by mouth every 6 (six) hours as needed for mild pain or moderate pain.   amLODipine 2.5 MG tablet Commonly known as: NORVASC Take 2.5 mg by mouth at bedtime.   aspirin 81 MG tablet Take 81 mg by mouth daily.   celecoxib 200 MG capsule Commonly known as: CELEBREX Take 1 capsule (200 mg total) by mouth 2 (two) times daily as needed for mild pain.   chlorthalidone 25 MG tablet Commonly known as: HYGROTON Take 25 mg by mouth every other day.   clopidogrel 75 MG tablet Commonly known as: PLAVIX Take 75 mg by mouth every other day.   doxazosin 4 MG  tablet Commonly known as: CARDURA Take 4 mg by mouth daily.   esomeprazole 20 MG packet Commonly known as: NEXIUM Take 20 mg by mouth daily before breakfast.   GLUCOSAMINE CHONDROITIN ADV PO Take 1,500 mg by mouth daily.   montelukast 10 MG tablet Commonly known as: SINGULAIR Take 10 mg by mouth every morning.   multivitamin capsule Take 1 capsule by mouth daily.   PRESERVISION AREDS 2 PO Take 1 capsule by mouth daily.   simvastatin 40 MG tablet Commonly known as: ZOCOR Take 40 mg by mouth daily.   traMADol 50 MG tablet Commonly known as: ULTRAM Take 1-2 tablets (50-100 mg total) by mouth every 6 (six) hours as needed (mild pain).   vitamin C 500 MG tablet Commonly known as: ASCORBIC ACID Take 500 mg by mouth daily.       Follow-up Information    Grace Isaac, MD Follow up.   Specialty: Cardiothoracic Surgery Contact information: [address] [place] [address] [phone]               Signed:   Ellwood Handler, PA-C  03/13/2020, 2:04 PM

## 2020-03-12 NOTE — Plan of Care (Signed)
  Problem: Education: Goal: Knowledge of General Education information will improve Description: Including pain rating scale, medication(s)/side effects and non-pharmacologic comfort measures 03/12/2020 0809 by Rozelle Logan, RN Outcome: Progressing 03/11/2020 1815 by Rozelle Logan, RN Outcome: Progressing   Problem: Health Behavior/Discharge Planning: Goal: Ability to manage health-related needs will improve 03/12/2020 0809 by RoddyHarrietta Guardian, RN Outcome: Progressing 03/11/2020 1815 by Rozelle Logan, RN Outcome: Progressing   Problem: Clinical Measurements: Goal: Ability to maintain clinical measurements within normal limits will improve 03/12/2020 0809 by RoddyHarrietta Guardian, RN Outcome: Progressing 03/11/2020 1815 by Rozelle Logan, RN Outcome: Progressing Goal: Will remain free from infection 03/12/2020 0809 by RoddyHarrietta Guardian, RN Outcome: Progressing 03/11/2020 1815 by Rozelle Logan, RN Outcome: Progressing Goal: Diagnostic test results will improve Outcome: Progressing Goal: Respiratory complications will improve 03/12/2020 0809 by RoddyHarrietta Guardian, RN Outcome: Progressing 03/11/2020 1815 by Rozelle Logan, RN Outcome: Progressing Goal: Cardiovascular complication will be avoided Outcome: Progressing   Problem: Activity: Goal: Risk for activity intolerance will decrease 03/12/2020 0809 by RoddyHarrietta Guardian, RN Outcome: Progressing 03/11/2020 1815 by Rozelle Logan, RN Outcome: Progressing   Problem: Nutrition: Goal: Adequate nutrition will be maintained Outcome: Progressing

## 2020-03-13 ENCOUNTER — Inpatient Hospital Stay (HOSPITAL_COMMUNITY): Payer: Medicare Other

## 2020-03-13 ENCOUNTER — Telehealth: Payer: Self-pay | Admitting: *Deleted

## 2020-03-13 DIAGNOSIS — C3431 Malignant neoplasm of lower lobe, right bronchus or lung: Secondary | ICD-10-CM

## 2020-03-13 LAB — CBC
HCT: 38.1 % — ABNORMAL LOW (ref 39.0–52.0)
Hemoglobin: 12.8 g/dL — ABNORMAL LOW (ref 13.0–17.0)
MCH: 31.2 pg (ref 26.0–34.0)
MCHC: 33.6 g/dL (ref 30.0–36.0)
MCV: 92.9 fL (ref 80.0–100.0)
Platelets: 92 10*3/uL — ABNORMAL LOW (ref 150–400)
RBC: 4.1 MIL/uL — ABNORMAL LOW (ref 4.22–5.81)
RDW: 14.5 % (ref 11.5–15.5)
WBC: 7.8 10*3/uL (ref 4.0–10.5)
nRBC: 0 % (ref 0.0–0.2)

## 2020-03-13 LAB — COMPREHENSIVE METABOLIC PANEL
ALT: 22 U/L (ref 0–44)
AST: 33 U/L (ref 15–41)
Albumin: 3.2 g/dL — ABNORMAL LOW (ref 3.5–5.0)
Alkaline Phosphatase: 116 U/L (ref 38–126)
Anion gap: 10 (ref 5–15)
BUN: 11 mg/dL (ref 8–23)
CO2: 24 mmol/L (ref 22–32)
Calcium: 9.8 mg/dL (ref 8.9–10.3)
Chloride: 103 mmol/L (ref 98–111)
Creatinine, Ser: 0.95 mg/dL (ref 0.61–1.24)
GFR calc Af Amer: >60 mL/min (ref >60)
GFR calc non Af Amer: >60 mL/min (ref >60)
Glucose, Bld: 104 mg/dL — ABNORMAL HIGH (ref 70–99)
Potassium: 3.9 mmol/L (ref 3.5–5.1)
Sodium: 137 mmol/L (ref 135–145)
Total Bilirubin: 1.5 mg/dL — ABNORMAL HIGH (ref 0.3–1.2)
Total Protein: 5.7 g/dL — ABNORMAL LOW (ref 6.5–8.1)

## 2020-03-13 IMAGING — DX DG CHEST 2V
2 series · 2 of 2 positions shown · non-contrast
Comparison: [DATE]

CLINICAL DATA: Chest tube removal

EXAM:
CHEST - 2 VIEW

[chest pa]
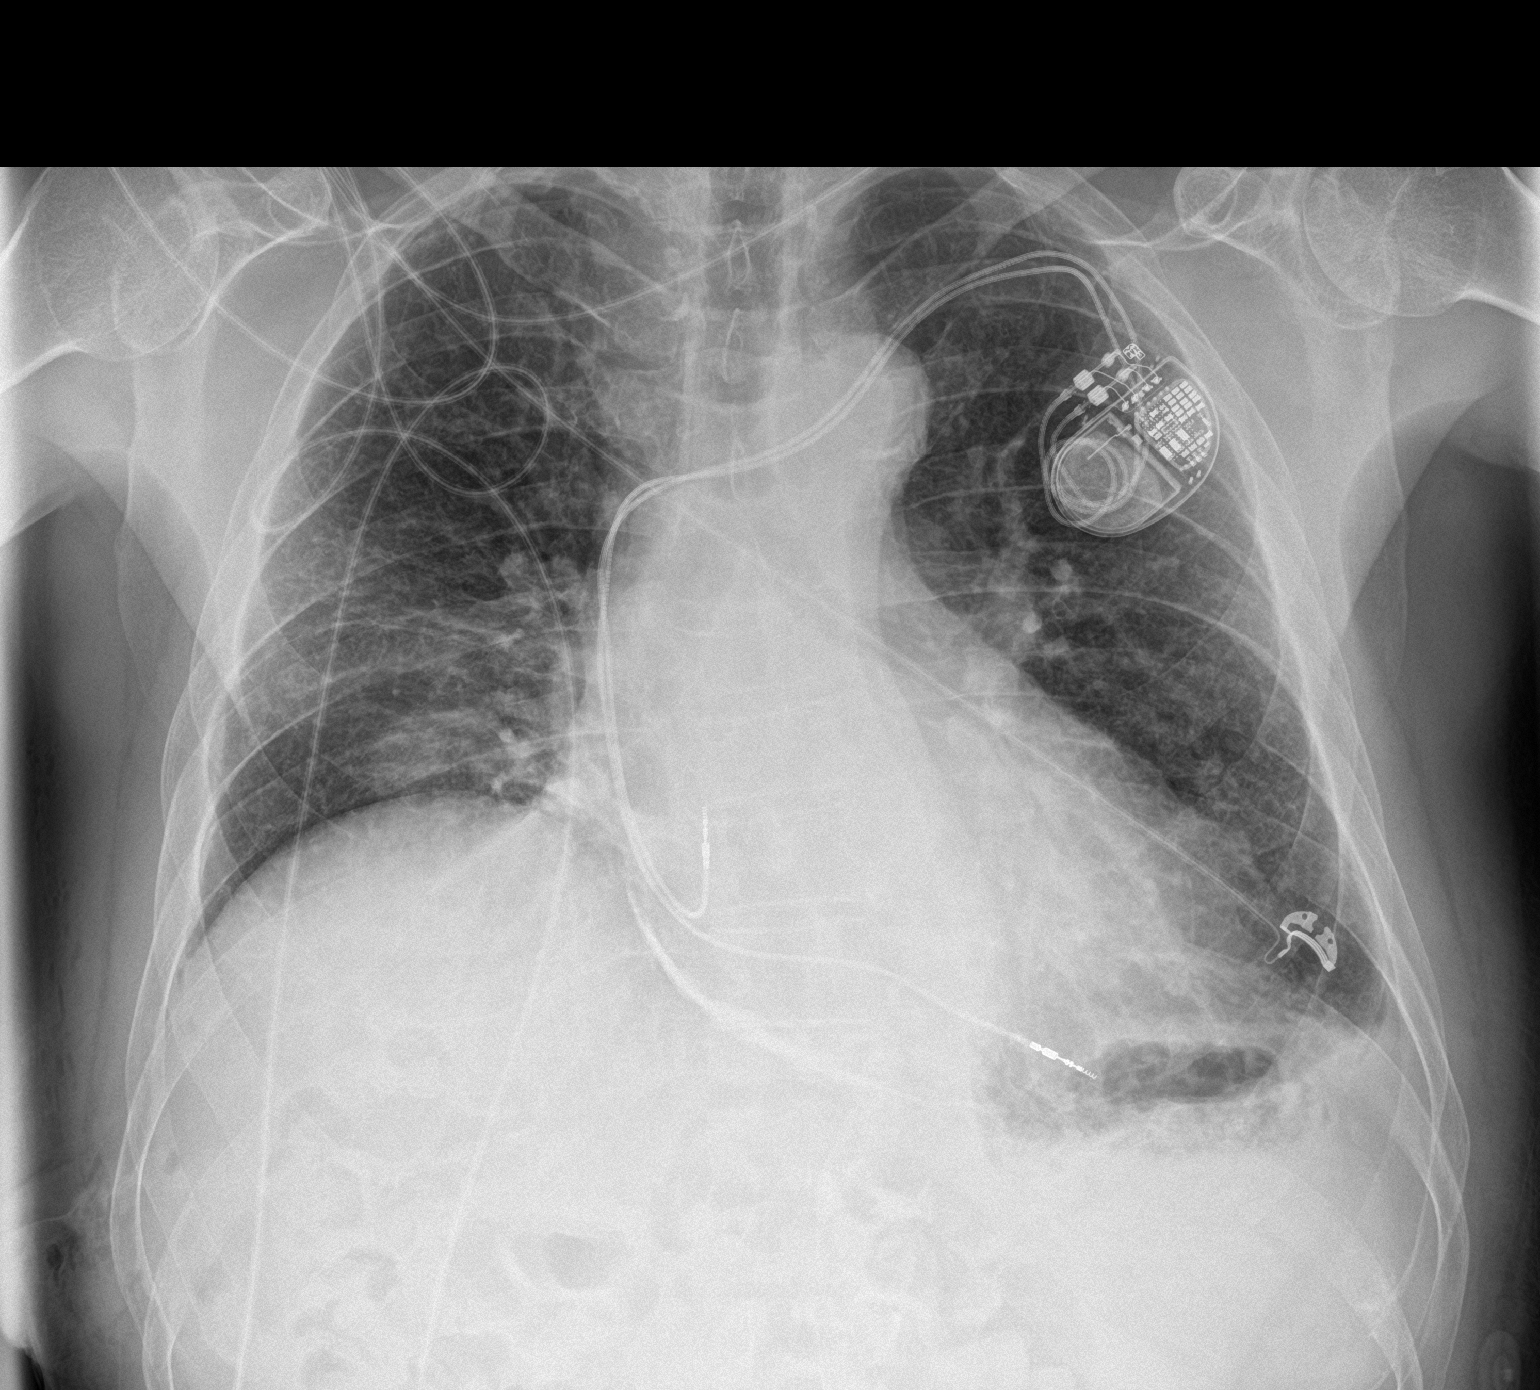

[chest lat]
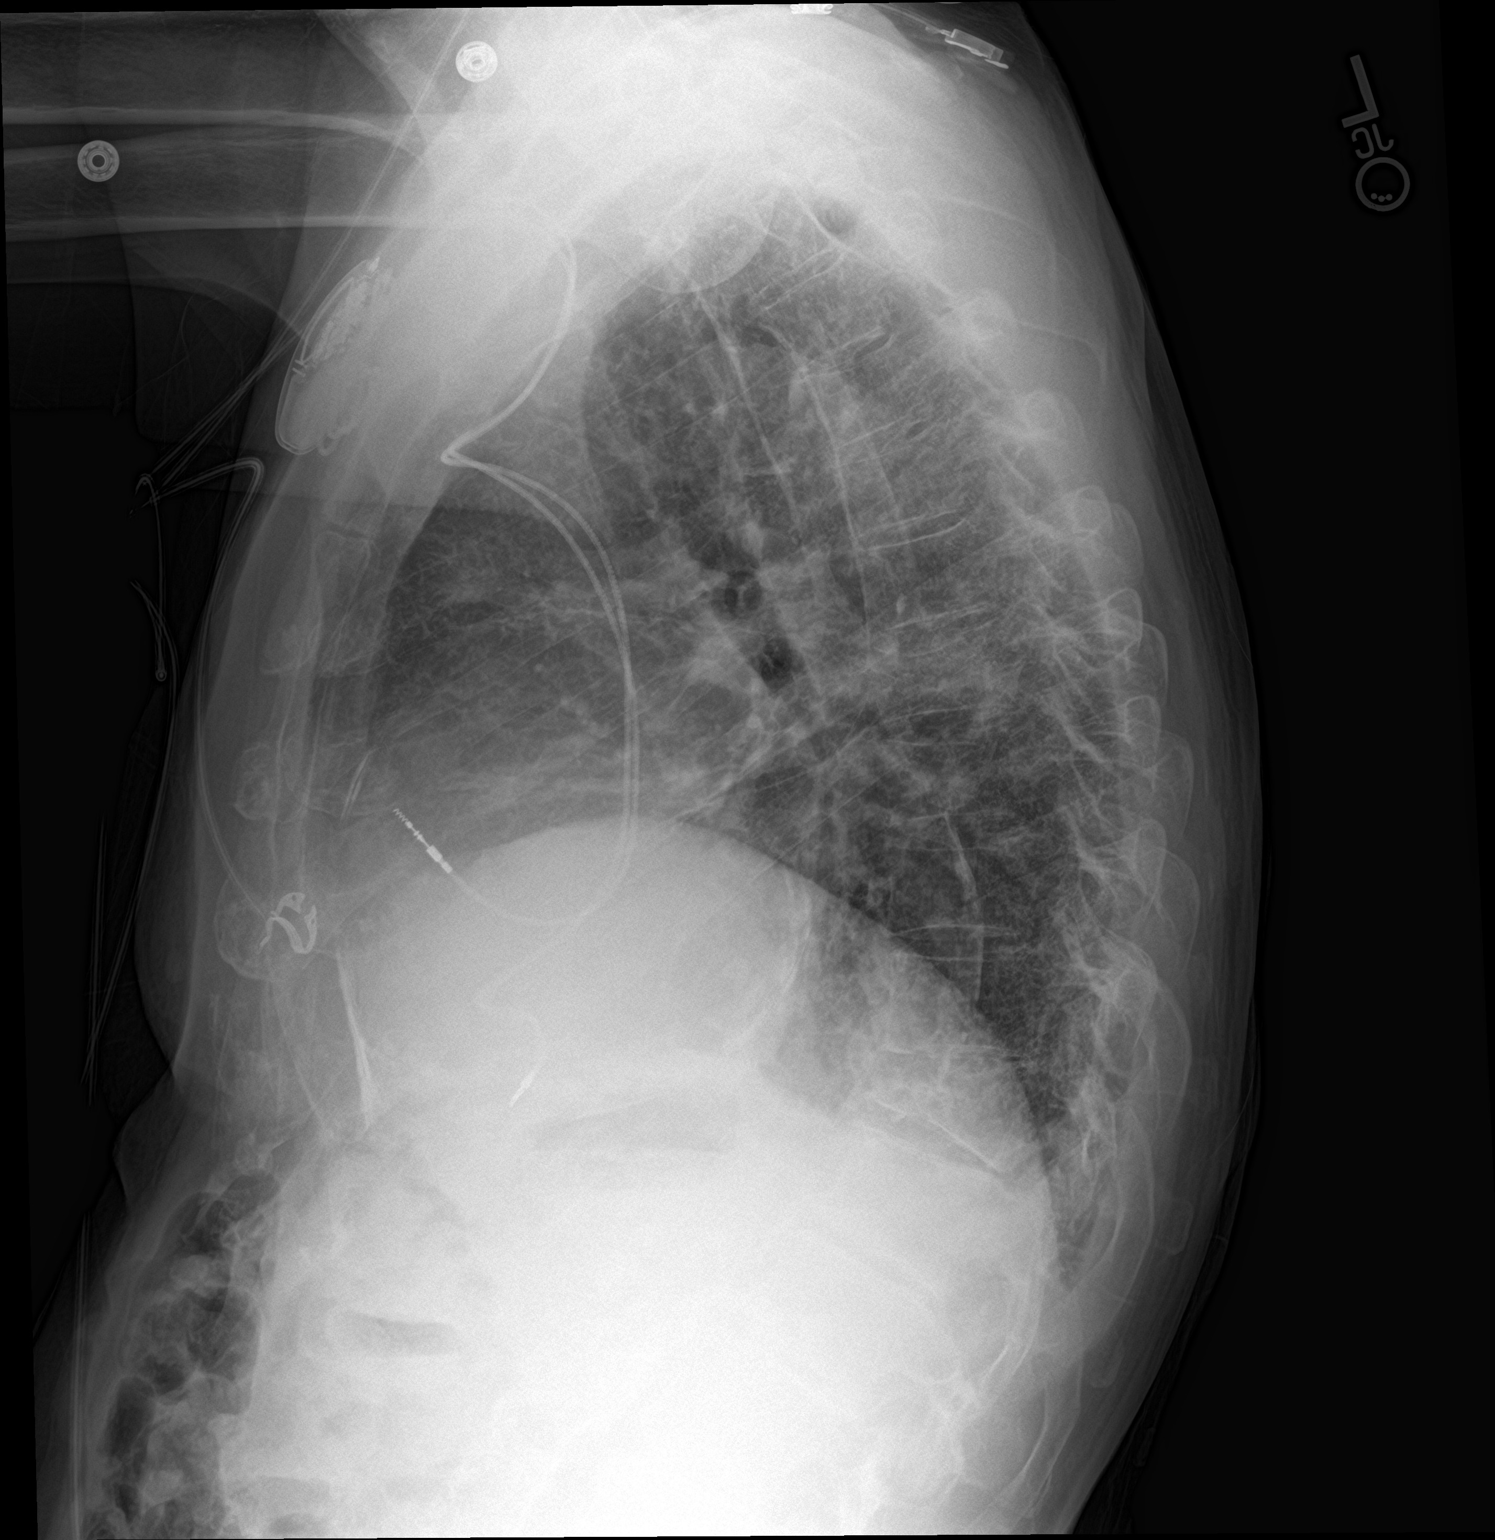

[2 of 2 positions shown; findings below may reference images not displayed]

FINDINGS: Interval removal of right chest tube. No visible pneumothorax. Left
pacer remains in place, unchanged. Bibasilar opacities, likely
atelectasis. Pericardial calcifications noted.
IMPRESSION: Interval removal of right chest tube without visible pneumothorax.
Otherwise no change.

## 2020-03-13 IMAGING — DX DG CHEST 1V PORT
1 series · 1 of 1 positions shown · non-contrast
Comparison: [DATE] chest radiograph and prior.

CLINICAL DATA: Status post lobectomy.

EXAM:
PORTABLE CHEST 1 VIEW

[chest ap]
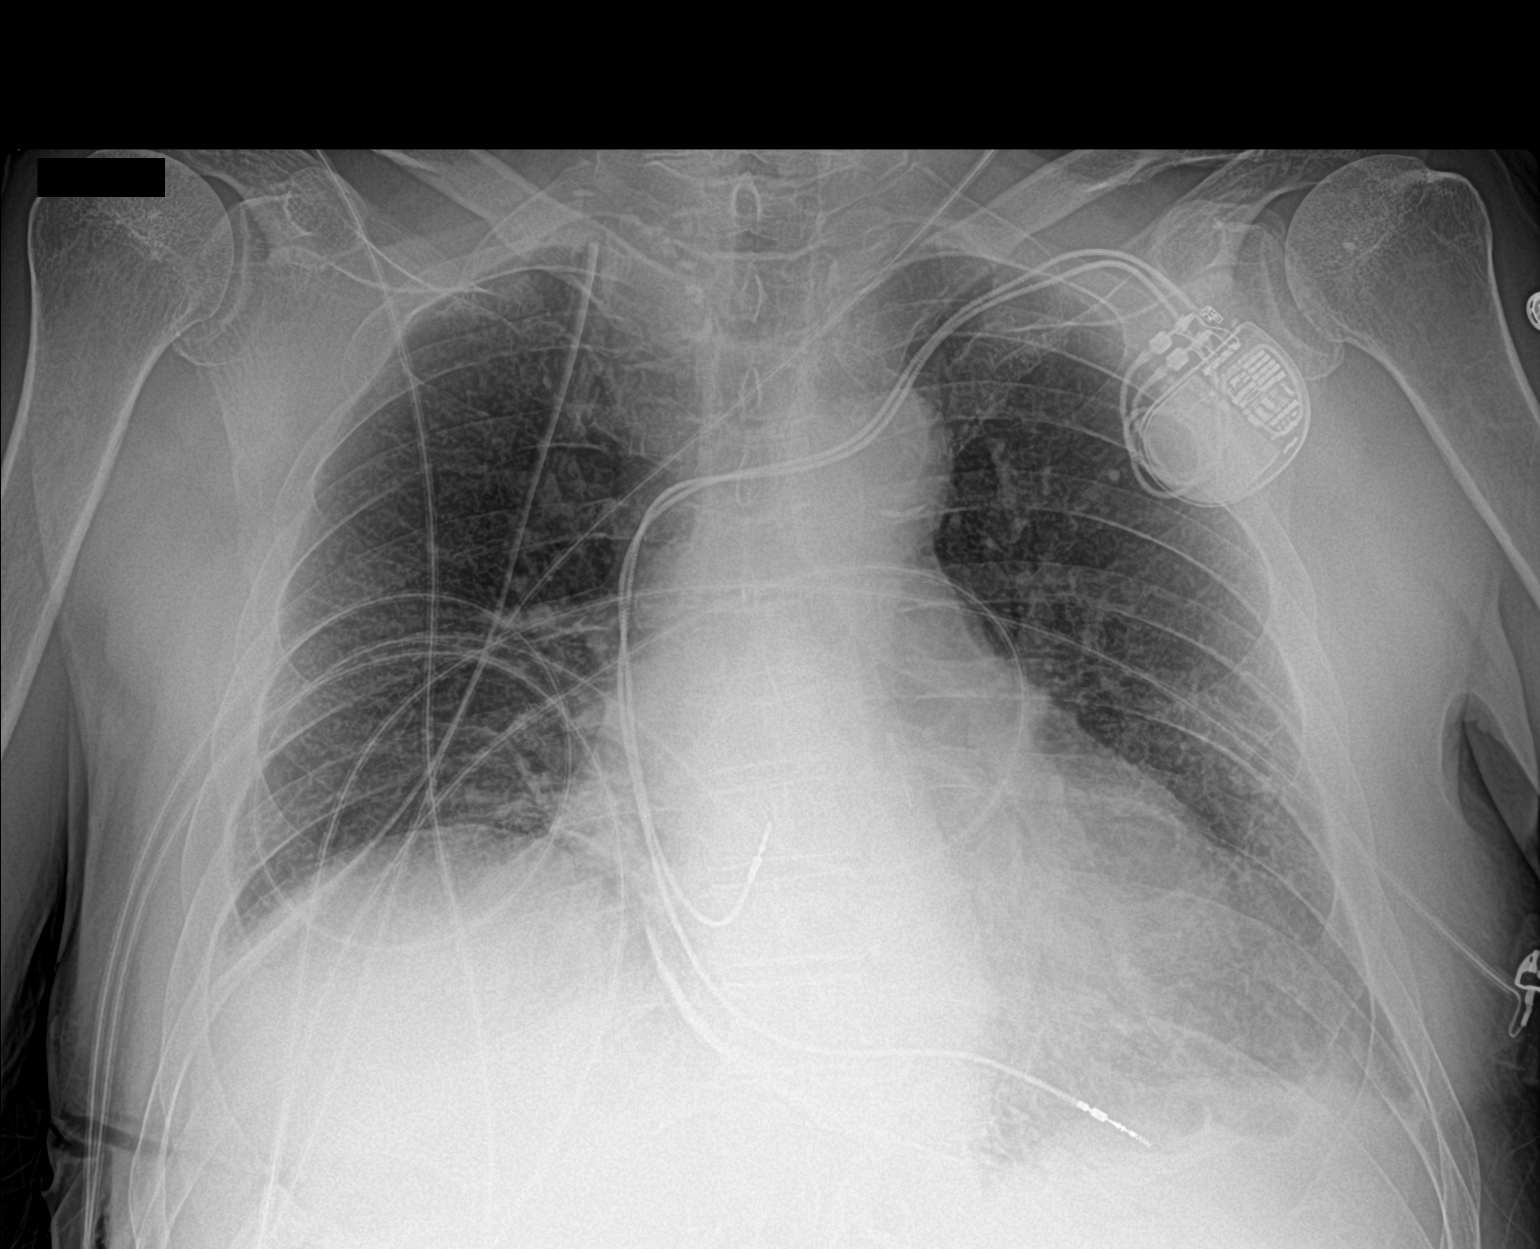

[1 of 1 positions shown; findings below may reference images not displayed]

FINDINGS: Apically terminating right chest tube is unchanged. Prior tiny right
basilar pneumothorax is not demonstrated on the current exam.
Bibasilar patchy opacities, likely atelectasis. No pleural effusion.

Stable cardiomediastinal silhouette. Left chest wall pacing device
with leads terminating over the right heart.
IMPRESSION: Prior tiny right basilar pneumothorax is not demonstrated on the
current exam.

Indwelling right chest tube, unchanged in positioning.

## 2020-03-13 IMAGING — CT CT HEAD W/O CM
4 series · 16 of 47 positions shown, 18 images · non-contrast
Comparison: PET-CT [DATE].

CLINICAL DATA: Brain/CNS neoplasm, surveillance. No headache, lung
cancer.

EXAM:
CT HEAD WITHOUT CONTRAST
TECHNIQUE: Contiguous axial images were obtained from the base of the skull
through the vertex without intravenous contrast.

[Series 3: head without · axial · non-contrast · 0.42mm/px · z∈[-126,-6]mm · 7 of 34 slices shown, 9 images]
[im 5/34  brain]
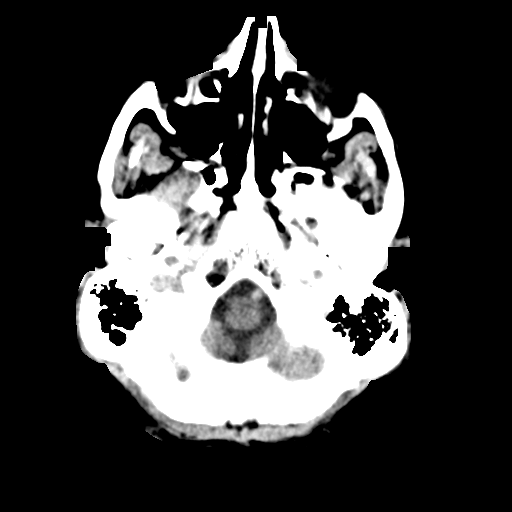
[im 5/34  bone]
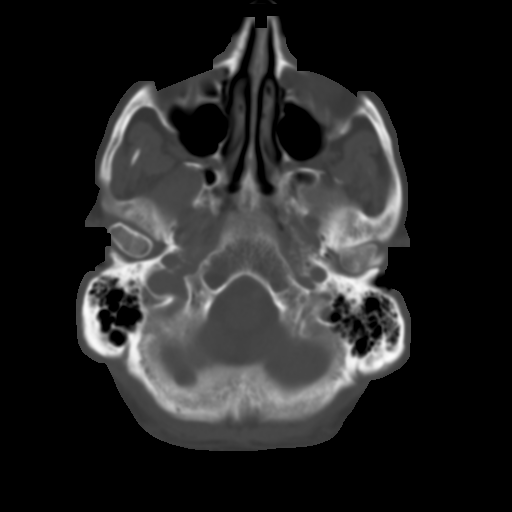
[im 9/34  brain]
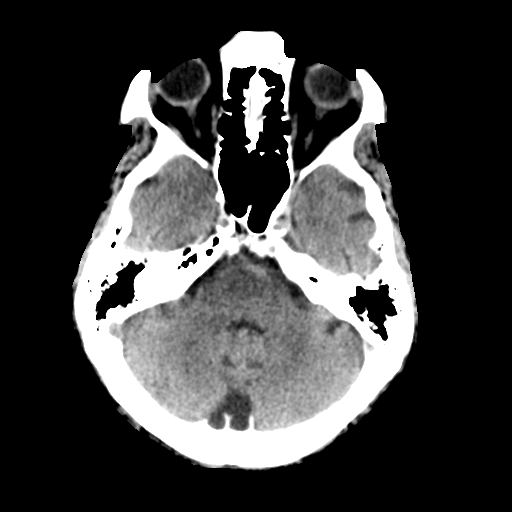
[im 13/34  brain]
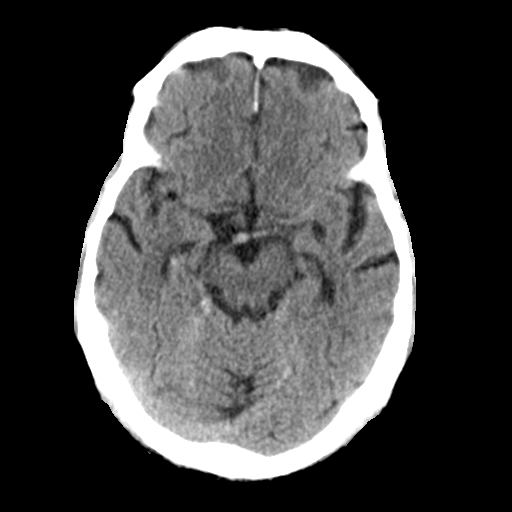
[im 17/34  brain]
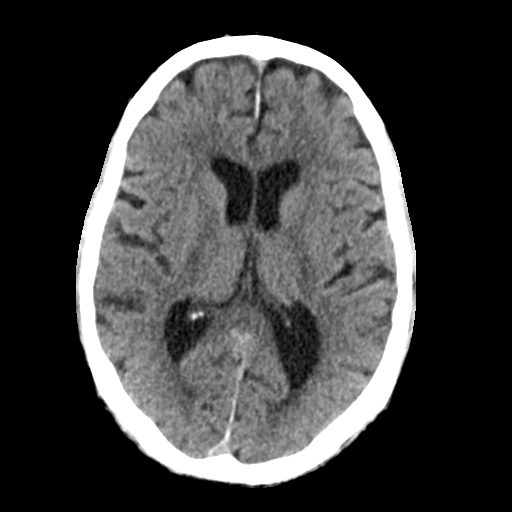
[im 21/34  brain]
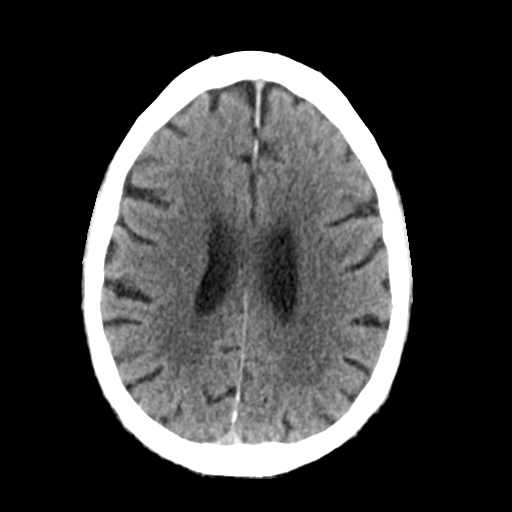
[im 21/34  bone]
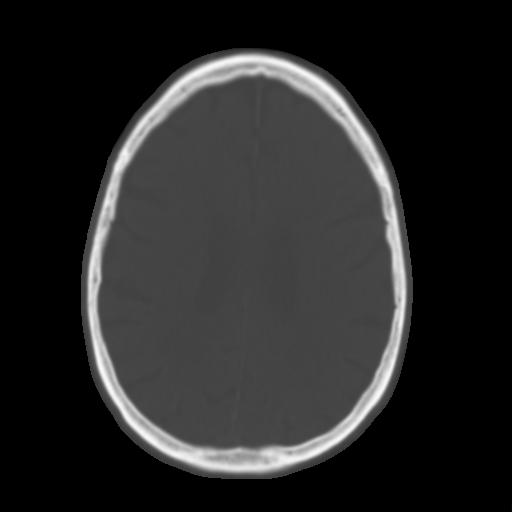
[im 25/34  brain]
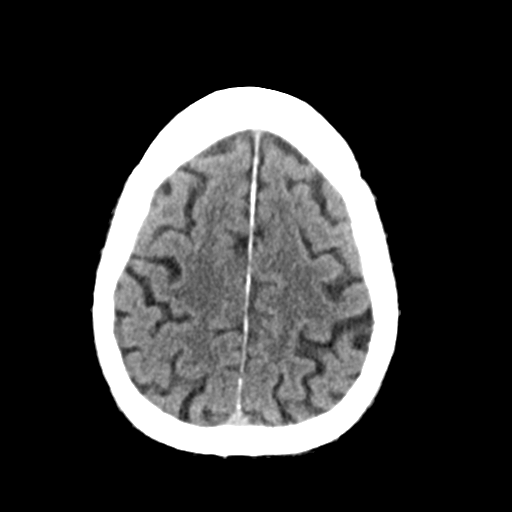
[im 29/34  brain]
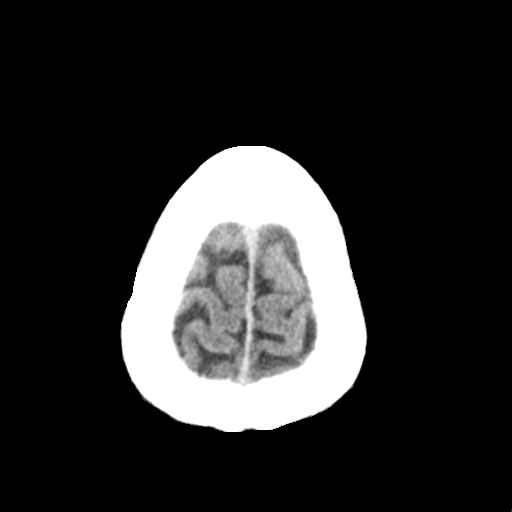

[Series 4: head bone · axial · 0.42mm/px · z∈[-130,-98]mm · 3 of 84 slices shown]
[im 9/84  bone]
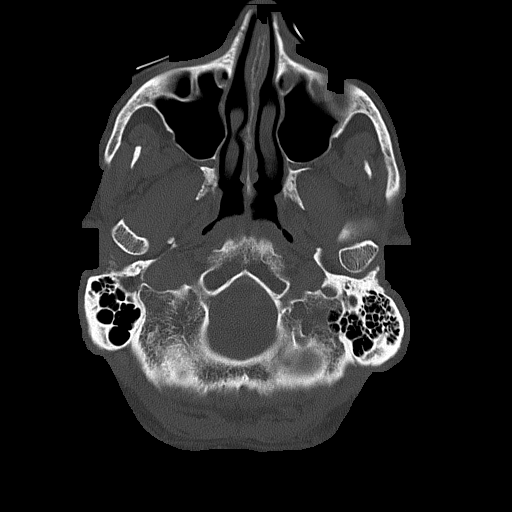
[im 17/84  bone]
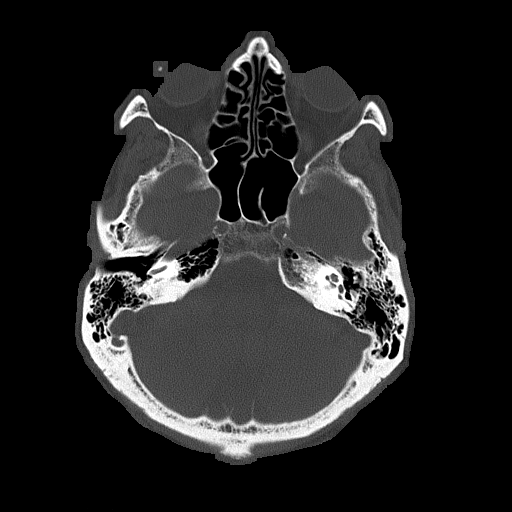
[im 25/84  bone]
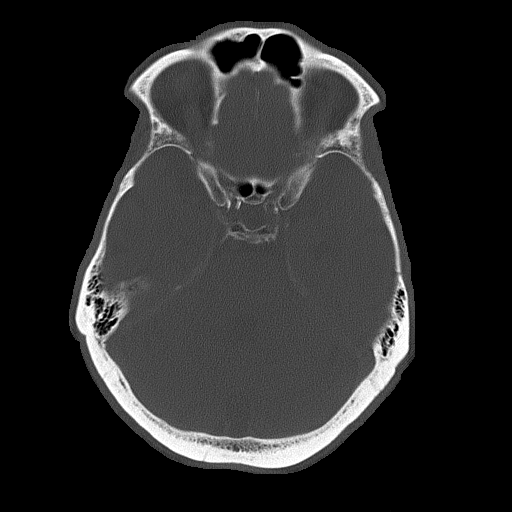

[Series 5: head without cor · coronal · non-contrast · 0.33mm/px · 3 of 70 slices shown]
[im 24/70  brain]
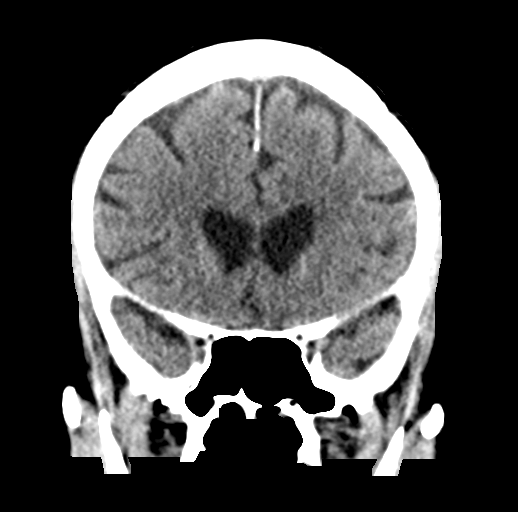
[im 31/70  brain]
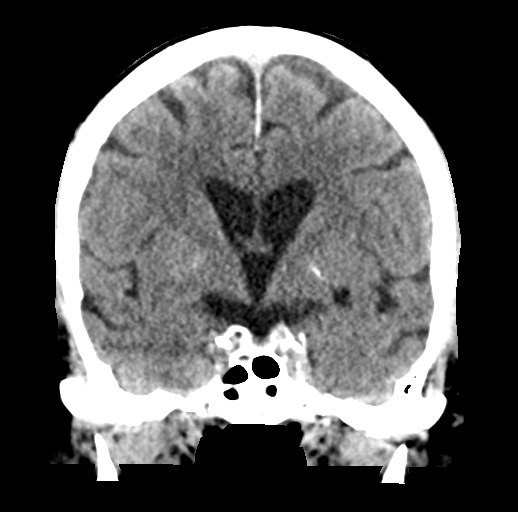
[im 39/70  brain]
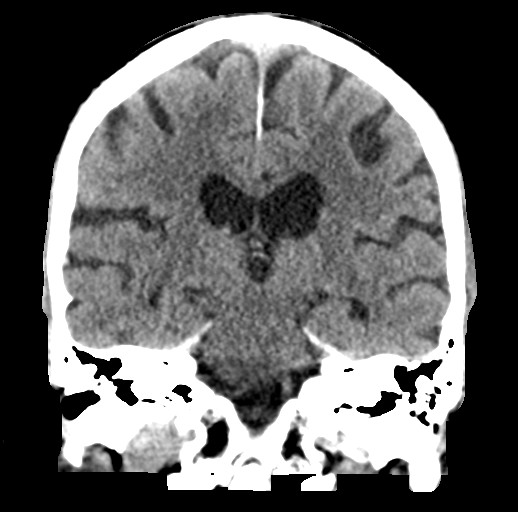

[Series 6: head without sag · sagittal · non-contrast · 0.34mm/px · 3 of 55 slices shown]
[im 19/55  brain]
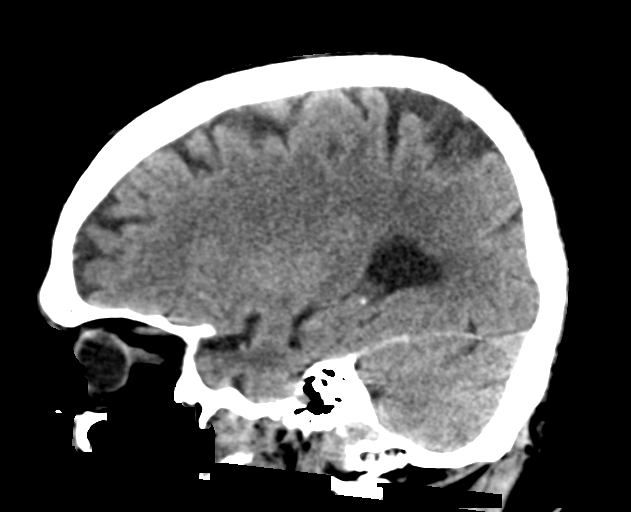
[im 28/55  brain]
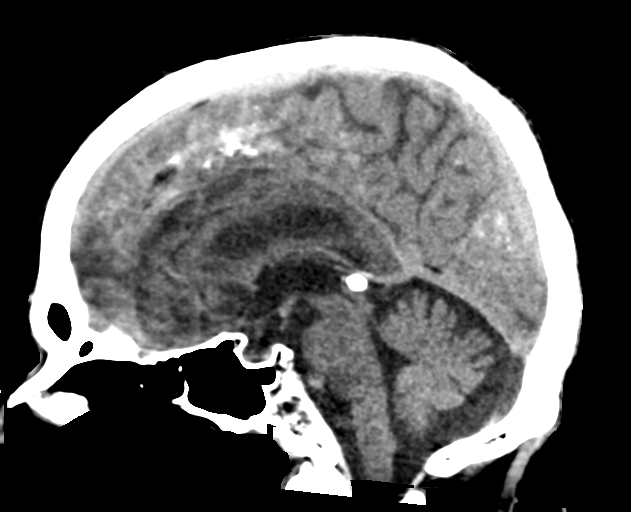
[im 37/55  brain]
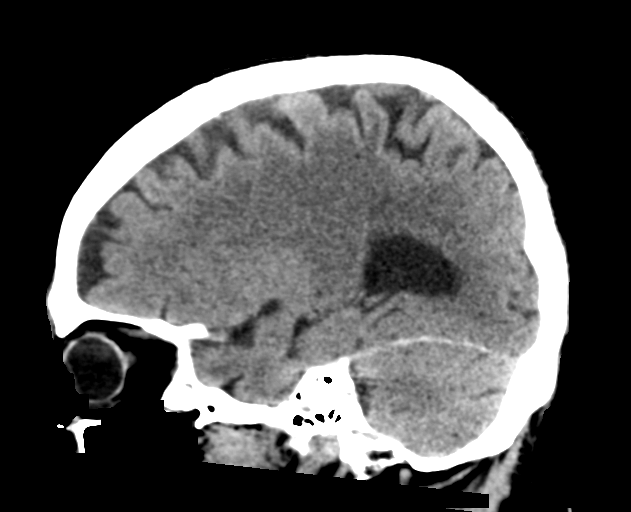

[16 of 47 positions shown; findings below may reference images not displayed]

FINDINGS: Brain:

Please note there is limited assessment for intracranial metastatic
disease on this noncontrast head CT.

Mild generalized parenchymal atrophy.

Mild ill-defined hypoattenuation within the cerebral white matter,
which is nonspecific, but consistent with chronic small vessel
ischemic disease.

There is no acute intracranial hemorrhage.

No demarcated cortical infarct.

No extra-axial fluid collection.

No evidence of intracranial mass.

No midline shift.

Vascular: No hyperdense vessel.  Atherosclerotic calcifications

Skull: Normal. Negative for fracture or focal lesion.

Sinuses/Orbits: Visualized orbits show no acute finding. Minimal
ethmoid and left maxillary sinus mucosal thickening. No significant
mastoid effusion.
IMPRESSION: Please note there is limited assessment for intracranial metastatic
disease on this non-contrast head CT. If the patient is unable to
have a brain MRI, consider contrast-enhanced CT imaging of the brain
for further evaluation.

No non-contrast CT evidence of acute intracranial abnormality.

Mild generalized parenchymal atrophy and chronic small vessel
ischemic disease.

Minimal ethmoid and left maxillary sinus mucosal thickening.

## 2020-03-13 MED ORDER — CELECOXIB 200 MG PO CAPS: 200.0000 mg | ORAL_CAPSULE | Freq: Two times a day (BID) | ORAL | 0 refills | Status: DC | PRN

## 2020-03-13 MED ORDER — TRAMADOL HCL 50 MG PO TABS: 50.0000 mg | ORAL_TABLET | Freq: Four times a day (QID) | ORAL | 0 refills | Status: DC | PRN

## 2020-03-13 NOTE — Progress Notes (Addendum)
[address]       [place] [address]             [phone]       2 Days Post-Op Procedure(s) (LRB): XI ROBOTIC ASSISTED THORACOSCOPY-RIGHT SUPERIOR SEGMENTECTOMY WITH NODE SAMPLES (Right) VIDEO BRONCHOSCOPY (N/A) INTERCOSTAL NERVE BLOCK (Right)   Subjective:  Didn't have a great night.  States people were in and out of room every couple hours.  If tube comes out would like to go home later today. + gas, no BM yet  Objective: Vital signs in last 24 hours: Temp:  [97.6 F (36.4 C)-99.1 F (37.3 C)] 99.1 F (37.3 C) (08/11 0355) Pulse Rate:  [58-80] 80 (08/11 0355) Cardiac Rhythm: Ventricular paced (08/11 0456) Resp:  [18-23] 22 (08/11 0355) BP: (115-146)/(63-69) 130/66 (08/11 0355) SpO2:  [93 %-97 %] 94 % (08/11 0355)  General appearance: alert, cooperative and no distress Heart: regular rate and rhythm Lungs: clear to auscultation bilaterally Abdomen: soft, non-tender; bowel sounds normal; no masses,  no organomegaly Wound: clean and dry  Lab Results: Recent Labs    03/12/20 0037 03/13/20 0146  WBC 9.6 7.8  HGB 13.0 12.8*  HCT 38.9* 38.1*  PLT 103* 92*   BMET:  Recent Labs    03/12/20 0037 03/13/20 0146  NA 133* 137  K 3.7 3.9  CL 98 103  CO2 24 24  GLUCOSE 162* 104*  BUN 16 11  CREATININE 1.00 0.95  CALCIUM 10.0 9.8    PT/INR:  Recent Labs    03/11/20 1012  LABPROT 14.4  INR 1.2   ABG    Component Value Date/Time   PHART 7.425 03/08/2020 1400   HCO3 23.4 03/08/2020 1400   ACIDBASEDEF 0.4 03/08/2020 1400   O2SAT 98.4 03/08/2020 1400   CBG (last 3)  No results for input(s): GLUCAP in the last 72 hours.  Assessment/Plan: S/P Procedure(s) (LRB): XI ROBOTIC ASSISTED THORACOSCOPY-RIGHT SUPERIOR SEGMENTECTOMY WITH NODE SAMPLES (Right) VIDEO BRONCHOSCOPY (N/A) INTERCOSTAL NERVE BLOCK (Right)  1. CV- NSR, BP stable- on home Norvasc, [place] 2. Pulm- CT w/o air leak, 460 cc output.. can possibly remove today, will  discuss with Dr. Servando Snare 3. Lovenox for DVT prophylaxis 4. Neuro- patient now wishes to have CT of head during his stay, I have ordered to evaluate for brain mets... patient has pacemaker and cant get MRI 5. Dispo- patient stable, no air leak from chest tube, output a little high at 460, can possibly remove CT today, CT scan ordered, possibly for discharge later today vs tomorrow, depending tube removal and completion of CT scan   LOS: 2 days    Ellwood Handler, PA-C 03/13/2020 CT HEAD WO CONTRAST  Result Date: 03/13/2020 CLINICAL DATA:  Brain/CNS neoplasm, surveillance. No headache, lung cancer. EXAM: CT HEAD WITHOUT CONTRAST TECHNIQUE: Contiguous axial images were obtained from the base of the skull through the vertex without intravenous contrast. COMPARISON:  PET-CT 02/09/2020. FINDINGS: Brain: Please note there is limited assessment for intracranial metastatic disease on this noncontrast head CT. Mild generalized parenchymal atrophy. Mild ill-defined hypoattenuation within the cerebral white matter, which is nonspecific, but consistent with chronic small vessel ischemic disease. There is no acute intracranial hemorrhage. No demarcated cortical infarct. No extra-axial fluid collection. No evidence of intracranial mass. No midline shift. Vascular: No hyperdense vessel.  Atherosclerotic calcifications Skull: Normal. Negative for fracture or focal lesion. Sinuses/Orbits: Visualized orbits show no acute finding. Minimal ethmoid and left maxillary sinus mucosal thickening. No significant mastoid  effusion. IMPRESSION: Please note there is limited assessment for intracranial metastatic disease on this non-contrast head CT. If the patient is unable to have a brain MRI, consider contrast-enhanced CT imaging of the brain for further evaluation. No non-contrast CT evidence of acute intracranial abnormality. Mild generalized parenchymal atrophy and chronic small vessel ischemic disease. Minimal ethmoid and left  maxillary sinus mucosal thickening. Electronically Signed   By: Kellie Simmering DO   On: 03/13/2020 08:22   DG CHEST PORT 1 VIEW  Result Date: 03/13/2020 CLINICAL DATA:  Status post lobectomy. EXAM: PORTABLE CHEST 1 VIEW COMPARISON:  03/12/2020 chest radiograph and prior. FINDINGS: Apically terminating right chest tube is unchanged. Prior tiny right basilar pneumothorax is not demonstrated on the current exam. Bibasilar patchy opacities, likely atelectasis. No pleural effusion. Stable cardiomediastinal silhouette. Left chest wall pacing device with leads terminating over the right heart. IMPRESSION: Prior tiny right basilar pneumothorax is not demonstrated on the current exam. Indwelling right chest tube, unchanged in positioning. Electronically Signed   By: Primitivo Gauze M.D.   On: 03/13/2020 08:20   Ct with no contrast - no mets Ct now out  PA and lateral  Chest xray not read yet - but appears ok for d/c today Follow up in surgery office and medical oncology set up I have seen and examined Alexander Murray and agree with the above assessment  and plan.  Grace Isaac MD [facility] [phone] Office [phone] 03/13/2020 2:55 PM

## 2020-03-13 NOTE — Progress Notes (Signed)
Removed PIV access x 1 and received discharge instructions. Patient understood it well as well as oncology appointment. HS [employer]

## 2020-03-13 NOTE — Telephone Encounter (Signed)
Called unit and spoke to nurse. She will update Alexander Murray on his appt to see Dr. Julien Nordmann.

## 2020-03-14 ENCOUNTER — Other Ambulatory Visit: Payer: Self-pay | Admitting: *Deleted

## 2020-03-14 NOTE — Progress Notes (Signed)
The proposed treatment discussed in cancer conference 03/14/20 is for discussion purpose only and is not a binding recommendation.  The patient was not physically examined nor present for their treatment options.  Therefore, final treatment plans cannot be decided.

## 2020-03-16 NOTE — Anesthesia Postprocedure Evaluation (Signed)
Anesthesia Post Note  Patient: Chantry Headen  Procedure(s) Performed: XI ROBOTIC ASSISTED THORACOSCOPY-RIGHT SUPERIOR SEGMENTECTOMY WITH NODE SAMPLES (Right Chest) VIDEO BRONCHOSCOPY (N/A ) INTERCOSTAL NERVE BLOCK (Right )     Patient location during evaluation: PACU Anesthesia Type: General Level of consciousness: awake and alert Pain management: pain level controlled Vital Signs Assessment: post-procedure vital signs reviewed and stable Respiratory status: spontaneous breathing, nonlabored ventilation, respiratory function stable and patient connected to nasal cannula oxygen Cardiovascular status: blood pressure returned to baseline and stable Postop Assessment: no apparent nausea or vomiting Anesthetic complications: no   No complications documented.  Last Vitals:  Vitals:   03/13/20 1125 03/13/20 1357  BP: (!) 146/74   Pulse: 65 70  Resp: (!) 22 (!) 21  Temp: 37.1 C   SpO2: 96% 98%    Last Pain:  Vitals:   03/13/20 1357  TempSrc:   PainSc: 2                  Amyr Sluder

## 2020-03-19 ENCOUNTER — Other Ambulatory Visit: Payer: Self-pay

## 2020-03-19 ENCOUNTER — Encounter: Payer: Self-pay | Admitting: Internal Medicine

## 2020-03-19 ENCOUNTER — Inpatient Hospital Stay: Payer: Medicare Other | Attending: Internal Medicine | Admitting: Internal Medicine

## 2020-03-19 ENCOUNTER — Inpatient Hospital Stay: Payer: Medicare Other

## 2020-03-19 VITALS — BP 144/75 | HR 73 | Temp 97.5°F | Resp 18 | Ht 70.0 in | Wt 162.7 lb

## 2020-03-19 DIAGNOSIS — K219 Gastro-esophageal reflux disease without esophagitis: Secondary | ICD-10-CM | POA: Diagnosis not present

## 2020-03-19 DIAGNOSIS — Z79899 Other long term (current) drug therapy: Secondary | ICD-10-CM | POA: Diagnosis not present

## 2020-03-19 DIAGNOSIS — Z5111 Encounter for antineoplastic chemotherapy: Secondary | ICD-10-CM

## 2020-03-19 DIAGNOSIS — M1990 Unspecified osteoarthritis, unspecified site: Secondary | ICD-10-CM | POA: Diagnosis not present

## 2020-03-19 DIAGNOSIS — E785 Hyperlipidemia, unspecified: Secondary | ICD-10-CM | POA: Insufficient documentation

## 2020-03-19 DIAGNOSIS — I10 Essential (primary) hypertension: Secondary | ICD-10-CM | POA: Insufficient documentation

## 2020-03-19 DIAGNOSIS — Z800 Family history of malignant neoplasm of digestive organs: Secondary | ICD-10-CM | POA: Insufficient documentation

## 2020-03-19 DIAGNOSIS — Z8673 Personal history of transient ischemic attack (TIA), and cerebral infarction without residual deficits: Secondary | ICD-10-CM | POA: Diagnosis not present

## 2020-03-19 DIAGNOSIS — Z87891 Personal history of nicotine dependence: Secondary | ICD-10-CM | POA: Diagnosis not present

## 2020-03-19 DIAGNOSIS — Z8042 Family history of malignant neoplasm of prostate: Secondary | ICD-10-CM | POA: Diagnosis not present

## 2020-03-19 DIAGNOSIS — Z7982 Long term (current) use of aspirin: Secondary | ICD-10-CM | POA: Diagnosis not present

## 2020-03-19 DIAGNOSIS — C3490 Malignant neoplasm of unspecified part of unspecified bronchus or lung: Secondary | ICD-10-CM | POA: Diagnosis not present

## 2020-03-19 DIAGNOSIS — I119 Hypertensive heart disease without heart failure: Secondary | ICD-10-CM | POA: Insufficient documentation

## 2020-03-19 DIAGNOSIS — M129 Arthropathy, unspecified: Secondary | ICD-10-CM | POA: Insufficient documentation

## 2020-03-19 DIAGNOSIS — I700 Atherosclerosis of aorta: Secondary | ICD-10-CM | POA: Insufficient documentation

## 2020-03-19 DIAGNOSIS — Z7189 Other specified counseling: Secondary | ICD-10-CM | POA: Diagnosis not present

## 2020-03-19 DIAGNOSIS — J45909 Unspecified asthma, uncomplicated: Secondary | ICD-10-CM | POA: Insufficient documentation

## 2020-03-19 DIAGNOSIS — E7800 Pure hypercholesterolemia, unspecified: Secondary | ICD-10-CM | POA: Diagnosis not present

## 2020-03-19 DIAGNOSIS — C3431 Malignant neoplasm of lower lobe, right bronchus or lung: Secondary | ICD-10-CM | POA: Insufficient documentation

## 2020-03-19 LAB — CMP (CANCER CENTER ONLY)
ALT: 23 U/L (ref 0–44)
AST: 21 U/L (ref 15–41)
Albumin: 3.4 g/dL — ABNORMAL LOW (ref 3.5–5.0)
Alkaline Phosphatase: 213 U/L — ABNORMAL HIGH (ref 38–126)
Anion gap: 8 (ref 5–15)
BUN: 13 mg/dL (ref 8–23)
CO2: 27 mmol/L (ref 22–32)
Calcium: 10.8 mg/dL — ABNORMAL HIGH (ref 8.9–10.3)
Chloride: 105 mmol/L (ref 98–111)
Creatinine: 0.91 mg/dL (ref 0.61–1.24)
GFR, Est AFR Am: >60 mL/min (ref >60)
GFR, Estimated: >60 mL/min (ref >60)
Glucose, Bld: 136 mg/dL — ABNORMAL HIGH (ref 70–99)
Potassium: 3.6 mmol/L (ref 3.5–5.1)
Sodium: 140 mmol/L (ref 135–145)
Total Bilirubin: 1 mg/dL (ref 0.3–1.2)
Total Protein: 6.7 g/dL (ref 6.5–8.1)

## 2020-03-19 LAB — CBC WITH DIFFERENTIAL (CANCER CENTER ONLY)
Abs Immature Granulocytes: 0.04 10*3/uL (ref 0.00–0.07)
Basophils Absolute: 0.1 10*3/uL (ref 0.0–0.1)
Basophils Relative: 1 %
Eosinophils Absolute: 0.4 10*3/uL (ref 0.0–0.5)
Eosinophils Relative: 7 %
HCT: 39.3 % (ref 39.0–52.0)
Hemoglobin: 13.2 g/dL (ref 13.0–17.0)
Immature Granulocytes: 1 %
Lymphocytes Relative: 21 %
Lymphs Abs: 1.2 10*3/uL (ref 0.7–4.0)
MCH: 31.1 pg (ref 26.0–34.0)
MCHC: 33.6 g/dL (ref 30.0–36.0)
MCV: 92.7 fL (ref 80.0–100.0)
Monocytes Absolute: 0.8 10*3/uL (ref 0.1–1.0)
Monocytes Relative: 14 %
Neutro Abs: 3.4 10*3/uL (ref 1.7–7.7)
Neutrophils Relative %: 56 %
Platelet Count: 165 10*3/uL (ref 150–400)
RBC: 4.24 MIL/uL (ref 4.22–5.81)
RDW: 13.9 % (ref 11.5–15.5)
WBC Count: 5.9 10*3/uL (ref 4.0–10.5)
nRBC: 0 % (ref 0.0–0.2)

## 2020-03-19 MED ORDER — PROCHLORPERAZINE MALEATE 10 MG PO TABS
10.0000 mg | ORAL_TABLET | Freq: Four times a day (QID) | ORAL | 0 refills | Status: DC | PRN
Start: 2020-03-19 — End: 2020-07-05

## 2020-03-19 NOTE — Progress Notes (Signed)
[place] [phone]   [phone]  CONSULT NOTE  REFERRING PHYSICIAN: Dr. Lanelle Bal  REASON FOR CONSULTATION:  79 years old white male recently diagnosed with lung cancer.  HPI Alexander Murray is a 79 y.o. male with past medical history significant for hypertension, dyslipidemia, hemochromatosis, GERD, esophageal stricture, asthma, osteoarthritis, bradycardia status post pacemaker placement, stroke as well as history of right shoulder skin cancer.  The patient was complaining of persistent cough for several weeks.  He has a chest x-ray in [facility] that showed a suspicious shadow.  This was followed by CT scan of the chest on 01/22/2020 that showed a pulmonary nodule in the peripheral superior segment of the right lower lobe measuring 1.8 cm suspicious for a malignant process.  There was no evidence of lymphadenopathy in the thorax.  The patient had a PET scan by his primary care physician on 02/09/2020 and it showed 1.8 cm superior segment right lower lobe nodule that is hypermetabolic and consistent with primary lung neoplasm.  There was also 1.0 cm right lower lobe nodule more inferiorly that is not hypermetabolic and likely benign.  There was no enlarged or hypermetabolic mediastinal or hilar lymph nodes to suggest metastatic adenopathy.  There was no findings for abdominal/pelvic metastatic disease or osseous metastatic disease. The patient was referred to Dr. Servando Snare and on March 12, 2020 he underwent video bronchoscopy, robotic assisted right superior segmentectomy with lymph node sampling under the care of Dr. Servando Snare.  The final pathology [id number]) was consistent with small cell carcinoma measuring 2.0 cm with visceral pleural invasion as well as lymphovascular invasion.  The dissected lymph nodes were negative for malignancy. Dr. Servando Snare kindly referred the patient to me today for evaluation and recommendation regarding  treatment of his condition. When seen today the patient is feeling fine except for the soreness on the right side of the chest.  He also has mild cough but no significant shortness of breath or hemoptysis.  He lost few pounds in the last few months.  He denied having any nausea, vomiting, diarrhea or constipation.  He denied having any headache or visual changes.  The patient has no fever or chills. Family history significant for mother and father with history of colon cancer at old age.  He has a brother with prostate cancer and hemochromatosis. The patient is married and has 2 living children and 1 daughter deceased in car accident.  He was accompanied today by his wife Ramona.  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  He has a history of smoking for few years during college but has secondhand smoking exposure from his wife.  He has no history of alcohol or drug abuse.  HPI  Past Medical History:  Diagnosis Date  . Allergy   . Arthritis    "right knee and right thumb" (12/19/2014)  . Asthma    pt denies  . Bronchitis 07/2016   started in December and continued about 2 months  . Esophageal stricture   . GERD (gastroesophageal reflux disease)   . Hemochromatosis   . Hypercalcemia   . Hypercholesteremia   . Hypertension   . Osteoarthritis   . Palpitations   . Presence of permanent cardiac pacemaker    hx bradycardia  . Retinal artery occlusion, branch    "right eye"  . Second degree AV block, Mobitz type II   . Skin cancer    right shoulder  .  Stroke [facility]) ~ 2011   "right eye"/ partial blindness    Past Surgical History:  Procedure Laterality Date  . CATARACT EXTRACTION, BILATERAL  01/2017  . COLONOSCOPY    . EP IMPLANTABLE DEVICE N/A 12/19/2014   Procedure: Pacemaker Implant;  Surgeon: Deboraha Sprang, MD;  Location: [place] [facility];  Service: Cardiovascular;  Laterality: N/A;  . ESOPHAGOGASTRODUODENOSCOPY (EGD) WITH ESOPHAGEAL DILATION   X 3  . EYE SURGERY Bilateral 2017  . INGUINAL HERNIA REPAIR Left 1980's  . INSERT / REPLACE / REMOVE PACEMAKER  12/19/2014  . INTERCOSTAL NERVE BLOCK Right 03/11/2020   Procedure: INTERCOSTAL NERVE BLOCK;  Surgeon: Grace Isaac, MD;  Location: [place];  Service: Thoracic;  Laterality: Right;  . JOINT REPLACEMENT Right 2016  . KNEE ARTHROSCOPY Right ~ 1982; ~ 1992  . MOHS SURGERY Right ~ 2014   "pre-melanoma scapula"  . POSTERIOR TIBIAL TENDON REPAIR Left 2012  . TONSILLECTOMY  1946  . TOTAL KNEE ARTHROPLASTY Right 05/20/2015   Procedure: RIGHT TOTAL KNEE ARTHROPLASTY;  Surgeon: Paralee Cancel, MD;  Location: [facility];  Service: Orthopedics;  Laterality: Right;  . UPPER GASTROINTESTINAL ENDOSCOPY    . VIDEO BRONCHOSCOPY N/A 03/11/2020   Procedure: VIDEO BRONCHOSCOPY;  Surgeon: Grace Isaac, MD;  Location: [facility] OR;  Service: Thoracic;  Laterality: N/A;  . XI ROBOTIC ASSISTED THORACOSCOPY- SEGMENTECTOMY Right 03/11/2020   Procedure: XI ROBOTIC ASSISTED THORACOSCOPY-RIGHT SUPERIOR SEGMENTECTOMY WITH NODE SAMPLES;  Surgeon: Grace Isaac, MD;  Location: [facility] OR;  Service: Thoracic;  Laterality: Right;    Family History  Problem Relation Age of Onset  . Colon cancer Mother        dx in her 16s  . Heart disease Mother   . Alzheimer's disease Mother   . Colon cancer Father        dx in his early 45s  . Heart disease Father   . Prostate cancer Father   . Prostate cancer Brother        1/2 brother  . Hemochromatosis Brother   . Prostate cancer Brother   . Esophageal cancer Neg Hx   . Pancreatic cancer Neg Hx   . Stomach cancer Neg Hx   . Liver disease Neg Hx     Social History Social History   Tobacco Use  . Smoking status: Former Smoker    Years: 3.00    Types: Cigarettes    Quit date: 05/16/1962    Years since quitting: 57.8  . Smokeless tobacco: Never Used  . Tobacco comment: "quit smoking in the 1960's"; was an intermittent smoker   Vaping Use  . Vaping Use: Never used   Substance Use Topics  . Alcohol use: No    Comment: 12/19/2014 "stopped drinking in ~ 2012"  . Drug use: No    No Known Allergies  Current Outpatient Medications  Medication Sig Dispense Refill  . acetaminophen (TYLENOL) 325 MG tablet Take 2 tablets (650 mg total) by mouth every 6 (six) hours as needed for mild pain or moderate pain.    Marland Kitchen amLODipine (NORVASC) 2.5 MG tablet Take 2.5 mg by mouth at bedtime.    Marland Kitchen aspirin 81 MG tablet Take 81 mg by mouth daily.    . celecoxib (CELEBREX) 200 MG capsule Take 1 capsule (200 mg total) by mouth 2 (two) times daily as needed for mild pain. 10 capsule 0  . chlorthalidone (HYGROTON) 25 MG tablet Take 25 mg by mouth every other day.     Marland Kitchen  clopidogrel (PLAVIX) 75 MG tablet Take 75 mg by mouth every other day.    . doxazosin (CARDURA) 4 MG tablet Take 4 mg by mouth daily.     Marland Kitchen esomeprazole (NEXIUM) 20 MG packet Take 20 mg by mouth daily before breakfast.    . Misc Natural Products (GLUCOSAMINE CHONDROITIN ADV PO) Take 1,500 mg by mouth daily.    . montelukast (SINGULAIR) 10 MG tablet Take 10 mg by mouth every morning.     . Multiple Vitamin (MULTIVITAMIN) capsule Take 1 capsule by mouth daily.    . Multiple Vitamins-Minerals (PRESERVISION AREDS 2 PO) Take 1 capsule by mouth daily.    . simvastatin (ZOCOR) 40 MG tablet Take 40 mg by mouth daily.    . traMADol (ULTRAM) 50 MG tablet Take 1-2 tablets (50-100 mg total) by mouth every 6 (six) hours as needed (mild pain). 30 tablet 0  . vitamin C (ASCORBIC ACID) 500 MG tablet Take 500 mg by mouth daily.      No current facility-administered medications for this visit.    Review of Systems  Constitutional: positive for fatigue Eyes: negative Ears, nose, mouth, throat, and face: negative Respiratory: positive for cough Cardiovascular: negative Gastrointestinal: negative Genitourinary:negative Integument/breast: negative Hematologic/lymphatic: negative Musculoskeletal:negative Neurological: negative  Behavioral/Psych: negative Endocrine: negative Allergic/Immunologic: negative  Physical Exam  LPF:XTKWI, healthy, no distress, well nourished and well developed SKIN: skin color, texture, turgor are normal, no rashes or significant lesions HEAD: Normocephalic, No masses, lesions, tenderness or abnormalities EYES: normal, PERRLA, Conjunctiva are pink and non-injected EARS: External ears normal, Canals clear OROPHARYNX:no exudate, no erythema and lips, buccal mucosa, and tongue normal  NECK: supple, no adenopathy, no JVD LYMPH:  no palpable lymphadenopathy, no hepatosplenomegaly LUNGS: clear to auscultation , and palpation HEART: regular rate & rhythm, no murmurs and no gallops ABDOMEN:abdomen soft, non-tender, normal bowel sounds and no masses or organomegaly BACK: No CVA tenderness, Range of motion is normal EXTREMITIES:no joint deformities, effusion, or inflammation, no edema  NEURO: alert & oriented x 3 with fluent speech, no focal motor/sensory deficits  PERFORMANCE STATUS: ECOG 1  LABORATORY DATA: Lab Results  Component Value Date   WBC 5.9 03/19/2020   HGB 13.2 03/19/2020   HCT 39.3 03/19/2020   MCV 92.7 03/19/2020   PLT 165 03/19/2020      Chemistry      Component Value Date/Time   NA 140 03/19/2020 1525   K 3.6 03/19/2020 1525   CL 105 03/19/2020 1525   CO2 27 03/19/2020 1525   BUN 13 03/19/2020 1525   CREATININE 0.91 03/19/2020 1525      Component Value Date/Time   CALCIUM 10.8 (H) 03/19/2020 1525   ALKPHOS 213 (H) 03/19/2020 1525   AST 21 03/19/2020 1525   ALT 23 03/19/2020 1525   BILITOT 1.0 03/19/2020 1525       RADIOGRAPHIC STUDIES: DG Chest 2 View  Result Date: 03/13/2020 CLINICAL DATA:  Chest tube removal EXAM: CHEST - 2 VIEW COMPARISON:  03/13/2020 FINDINGS: Interval removal of right chest tube. No visible pneumothorax. Left pacer remains in place, unchanged. Bibasilar opacities, likely atelectasis. Pericardial calcifications noted. IMPRESSION:  Interval removal of right chest tube without visible pneumothorax. Otherwise no change. Electronically Signed   By: Rolm Baptise M.D.   On: 03/13/2020 15:44   DG Chest 2 View  Result Date: 03/08/2020 CLINICAL DATA:  Preop thoracoscopy bronchoscopy right lung nodule EXAM: CHEST - 2 VIEW COMPARISON:  PET CT 02/09/2020, radiograph 12/22/2016 FINDINGS: Left-sided pacing device with  leads over right atrium and right ventricle. Mild cardiomegaly. Crescent shaped calcification over the base of the heart consistent with pericardial effusion and likely related to prior pericarditis. Aortic atherosclerosis. No pneumothorax. Vague opacity in the right lower lung may correspond to the lung nodule. Severe chronic compression fracture at T11. IMPRESSION: 1. Cardiomegaly without edema or acute airspace disease. Pericardial calcification probably related to prior pericarditis. 2. Vague opacity in the right lower lung may correspond to lung nodule seen on CT. Electronically Signed   By: Donavan Foil M.D.   On: 03/08/2020 16:44   CT HEAD WO CONTRAST  Result Date: 03/13/2020 CLINICAL DATA:  Brain/CNS neoplasm, surveillance. No headache, lung cancer. EXAM: CT HEAD WITHOUT CONTRAST TECHNIQUE: Contiguous axial images were obtained from the base of the skull through the vertex without intravenous contrast. COMPARISON:  PET-CT 02/09/2020. FINDINGS: Brain: Please note there is limited assessment for intracranial metastatic disease on this noncontrast head CT. Mild generalized parenchymal atrophy. Mild ill-defined hypoattenuation within the cerebral white matter, which is nonspecific, but consistent with chronic small vessel ischemic disease. There is no acute intracranial hemorrhage. No demarcated cortical infarct. No extra-axial fluid collection. No evidence of intracranial mass. No midline shift. Vascular: No hyperdense vessel.  Atherosclerotic calcifications Skull: Normal. Negative for fracture or focal lesion. Sinuses/Orbits:  Visualized orbits show no acute finding. Minimal ethmoid and left maxillary sinus mucosal thickening. No significant mastoid effusion. IMPRESSION: Please note there is limited assessment for intracranial metastatic disease on this non-contrast head CT. If the patient is unable to have a brain MRI, consider contrast-enhanced CT imaging of the brain for further evaluation. No non-contrast CT evidence of acute intracranial abnormality. Mild generalized parenchymal atrophy and chronic small vessel ischemic disease. Minimal ethmoid and left maxillary sinus mucosal thickening. Electronically Signed   By: Kellie Simmering DO   On: 03/13/2020 08:22   DG CHEST PORT 1 VIEW  Result Date: 03/13/2020 CLINICAL DATA:  Status post lobectomy. EXAM: PORTABLE CHEST 1 VIEW COMPARISON:  03/12/2020 chest radiograph and prior. FINDINGS: Apically terminating right chest tube is unchanged. Prior tiny right basilar pneumothorax is not demonstrated on the current exam. Bibasilar patchy opacities, likely atelectasis. No pleural effusion. Stable cardiomediastinal silhouette. Left chest wall pacing device with leads terminating over the right heart. IMPRESSION: Prior tiny right basilar pneumothorax is not demonstrated on the current exam. Indwelling right chest tube, unchanged in positioning. Electronically Signed   By: Primitivo Gauze M.D.   On: 03/13/2020 08:20   DG CHEST PORT 1 VIEW  Result Date: 03/12/2020 CLINICAL DATA:  Postop from right lower lobectomy. EXAM: PORTABLE CHEST 1 VIEW COMPARISON:  03/11/2020 FINDINGS: Right-sided chest tube remains in place. A tiny approximately 5-10% right basilar pneumothorax is seen. Mild right basilar atelectasis is noted. No evidence of pulmonary consolidation or pleural effusion. Heart size is stable. IMPRESSION: Tiny approximately 5-10% right basilar pneumothorax. Mild right basilar atelectasis. Electronically Signed   By: Marlaine Hind M.D.   On: 03/12/2020 08:30   DG Chest Port 1 View   Result Date: 03/11/2020 CLINICAL DATA:  Status post partial lobectomy. Chest tube placement. EXAM: PORTABLE CHEST 1 VIEW COMPARISON:  03/08/2020 FINDINGS: There are postsurgical changes in the right lower lung from interval partial lobectomy. There is a right-sided chest tube directed towards the apex. There is no focal consolidation. There is mild right basilar atelectasis. There is no pleural effusion or pneumothorax. The heart and mediastinal contours are unremarkable. There is a dual lead cardiac pacemaker. There is no acute osseous  abnormality. IMPRESSION: 1. Right-sided chest tube directed towards the apex. No pneumothorax. 2. Right basilar atelectasis. Electronically Signed   By: Kathreen Devoid   On: 03/11/2020 11:06   CUP PACEART REMOTE DEVICE CHECK  Result Date: 02/28/2020 Scheduled remote reviewed. Normal device function.  Next remote 91 days. Kathy Breach, RN, CCDS, [employer]   ASSESSMENT: This is a very pleasant 79 years old white male recently diagnosed with a stage Ib (T2 a, N0, M0) small cell lung cancer presented with superior segment right lower lobe lung nodule status post right lower lobe superior segmentectomy with lymph node sampling on March 12, 2020 under the care of Dr. Servando Snare.   PLAN: I had a lengthy discussion with the patient and his wife today about his current disease stage, prognosis and treatment options. I personally and independently reviewed his imaging studies as well as the pathology report. I explained to the patient that he has early stage disease but with the diagnosis of small cell lung cancer, he would definitely benefit from adjuvant systemic chemotherapy with platinum based regimen. I recommended for the patient 4 cycles of systemic chemotherapy with carboplatin for AUC of 5 on day 1, etoposide 100 mg/M2 on days 1, 2 and 3 with Neulasta support on day 5. The patient will not be eligible for treatment with cisplatin because of his age and toxicity  of the drug. I discussed with him the adverse effect of the chemotherapy including but not limited to alopecia, myelosuppression, nausea and vomiting, peripheral neuropathy, liver or renal dysfunction. He is expected to start the first cycle of his treatment on April 08, 2020. I recommended for the patient to complete the staging work-up by ordering MRI of the brain to rule out brain metastasis.  The patient has a pacemaker but according to his cardiologist the leads are MRI compatible. The patient will have a chemotherapy education class before the first dose of his treatment. I will call his pharmacy with prescription for Compazine 10 mg p.o. every 6 hours as needed for nausea. The patient will come back for follow-up visit with the first day of his treatment. The patient and his wife agreed to the current plan. He was advised to call immediately if he has any other concerning symptoms in the interval. The patient voices understanding of current disease status and treatment options and is in agreement with the current care plan.  All questions were answered. The patient knows to call the clinic with any problems, questions or concerns. We can certainly see the patient much sooner if necessary.  Thank you so much for allowing me to participate in the care of [employer]. I will continue to follow up the patient with you and assist in his care.  The total time spent in the appointment was 90 minutes.  Disclaimer: This note was dictated with voice recognition software. Similar sounding words can inadvertently be transcribed and may not be corrected upon review.   Eilleen Kempf March 19, 2020, 4:08 PM

## 2020-03-19 NOTE — Progress Notes (Signed)
START ON PATHWAY REGIMEN - Small Cell Lung     A cycle is every 21 days:     Carboplatin      Etoposide   **Always confirm dose/schedule in your pharmacy ordering system**  Patient Characteristics: Postoperative (Pathologic Staging), Adjuvant Therapy Therapeutic Status: Postoperative (Pathologic Staging) AJCC T Category: pT2a AJCC N Category: pN0 AJCC M Category: cM0 AJCC 8 Stage Grouping: IB Intent of Therapy: Curative Intent, Discussed with Patient

## 2020-03-20 ENCOUNTER — Encounter: Payer: Self-pay | Admitting: *Deleted

## 2020-03-20 DIAGNOSIS — Z23 Encounter for immunization: Secondary | ICD-10-CM | POA: Diagnosis not present

## 2020-03-20 NOTE — Progress Notes (Signed)
I followed up with auth team to get Dr. Lasandra Beech MRI approved.  I was notified scan has been authed. I called central scheduling and was given a date and time for appt. I called and spoke with [name] and notified him of appt time and place. He verbalized understanding.

## 2020-03-26 ENCOUNTER — Other Ambulatory Visit: Payer: Self-pay

## 2020-03-26 ENCOUNTER — Inpatient Hospital Stay: Payer: Medicare Other

## 2020-03-27 ENCOUNTER — Other Ambulatory Visit: Payer: Self-pay | Admitting: Cardiothoracic Surgery

## 2020-03-27 DIAGNOSIS — C3431 Malignant neoplasm of lower lobe, right bronchus or lung: Secondary | ICD-10-CM

## 2020-03-28 ENCOUNTER — Ambulatory Visit
Admission: RE | Admit: 2020-03-28 | Discharge: 2020-03-28 | Disposition: A | Discharge status: Home / Self Care | Payer: Medicare Other | Source: Ambulatory Visit | Attending: Cardiothoracic Surgery | Admitting: Cardiothoracic Surgery

## 2020-03-28 ENCOUNTER — Other Ambulatory Visit: Payer: Self-pay

## 2020-03-28 ENCOUNTER — Ambulatory Visit (INDEPENDENT_AMBULATORY_CARE_PROVIDER_SITE_OTHER): Payer: Self-pay | Admitting: Cardiothoracic Surgery

## 2020-03-28 VITALS — BP 132/77 | HR 64 | Temp 97.8°F | Resp 20 | Ht 70.0 in | Wt 156.0 lb

## 2020-03-28 DIAGNOSIS — N390 Urinary tract infection, site not specified: Secondary | ICD-10-CM | POA: Diagnosis not present

## 2020-03-28 DIAGNOSIS — C3490 Malignant neoplasm of unspecified part of unspecified bronchus or lung: Secondary | ICD-10-CM | POA: Diagnosis not present

## 2020-03-28 DIAGNOSIS — C3431 Malignant neoplasm of lower lobe, right bronchus or lung: Secondary | ICD-10-CM

## 2020-03-28 DIAGNOSIS — I10 Essential (primary) hypertension: Secondary | ICD-10-CM | POA: Diagnosis not present

## 2020-03-28 DIAGNOSIS — Z23 Encounter for immunization: Secondary | ICD-10-CM | POA: Diagnosis not present

## 2020-03-28 DIAGNOSIS — Z09 Encounter for follow-up examination after completed treatment for conditions other than malignant neoplasm: Secondary | ICD-10-CM

## 2020-03-28 DIAGNOSIS — I700 Atherosclerosis of aorta: Secondary | ICD-10-CM | POA: Diagnosis not present

## 2020-03-28 DIAGNOSIS — I219 Acute myocardial infarction, unspecified: Secondary | ICD-10-CM | POA: Diagnosis not present

## 2020-03-28 DIAGNOSIS — S22000A Wedge compression fracture of unspecified thoracic vertebra, initial encounter for closed fracture: Secondary | ICD-10-CM | POA: Diagnosis not present

## 2020-03-28 IMAGING — DX DG CHEST 2V
2 series · 2 of 2 positions shown · non-contrast
Comparison: [DATE] chest radiograph; PET-CT [DATE].
Thoracic CT [DATE]

CLINICAL DATA: Reported recent robotic assisted thoracoscopy for
lung carcinoma

EXAM:
CHEST - 2 VIEW

[dg chest 2 view (1 of 2)]
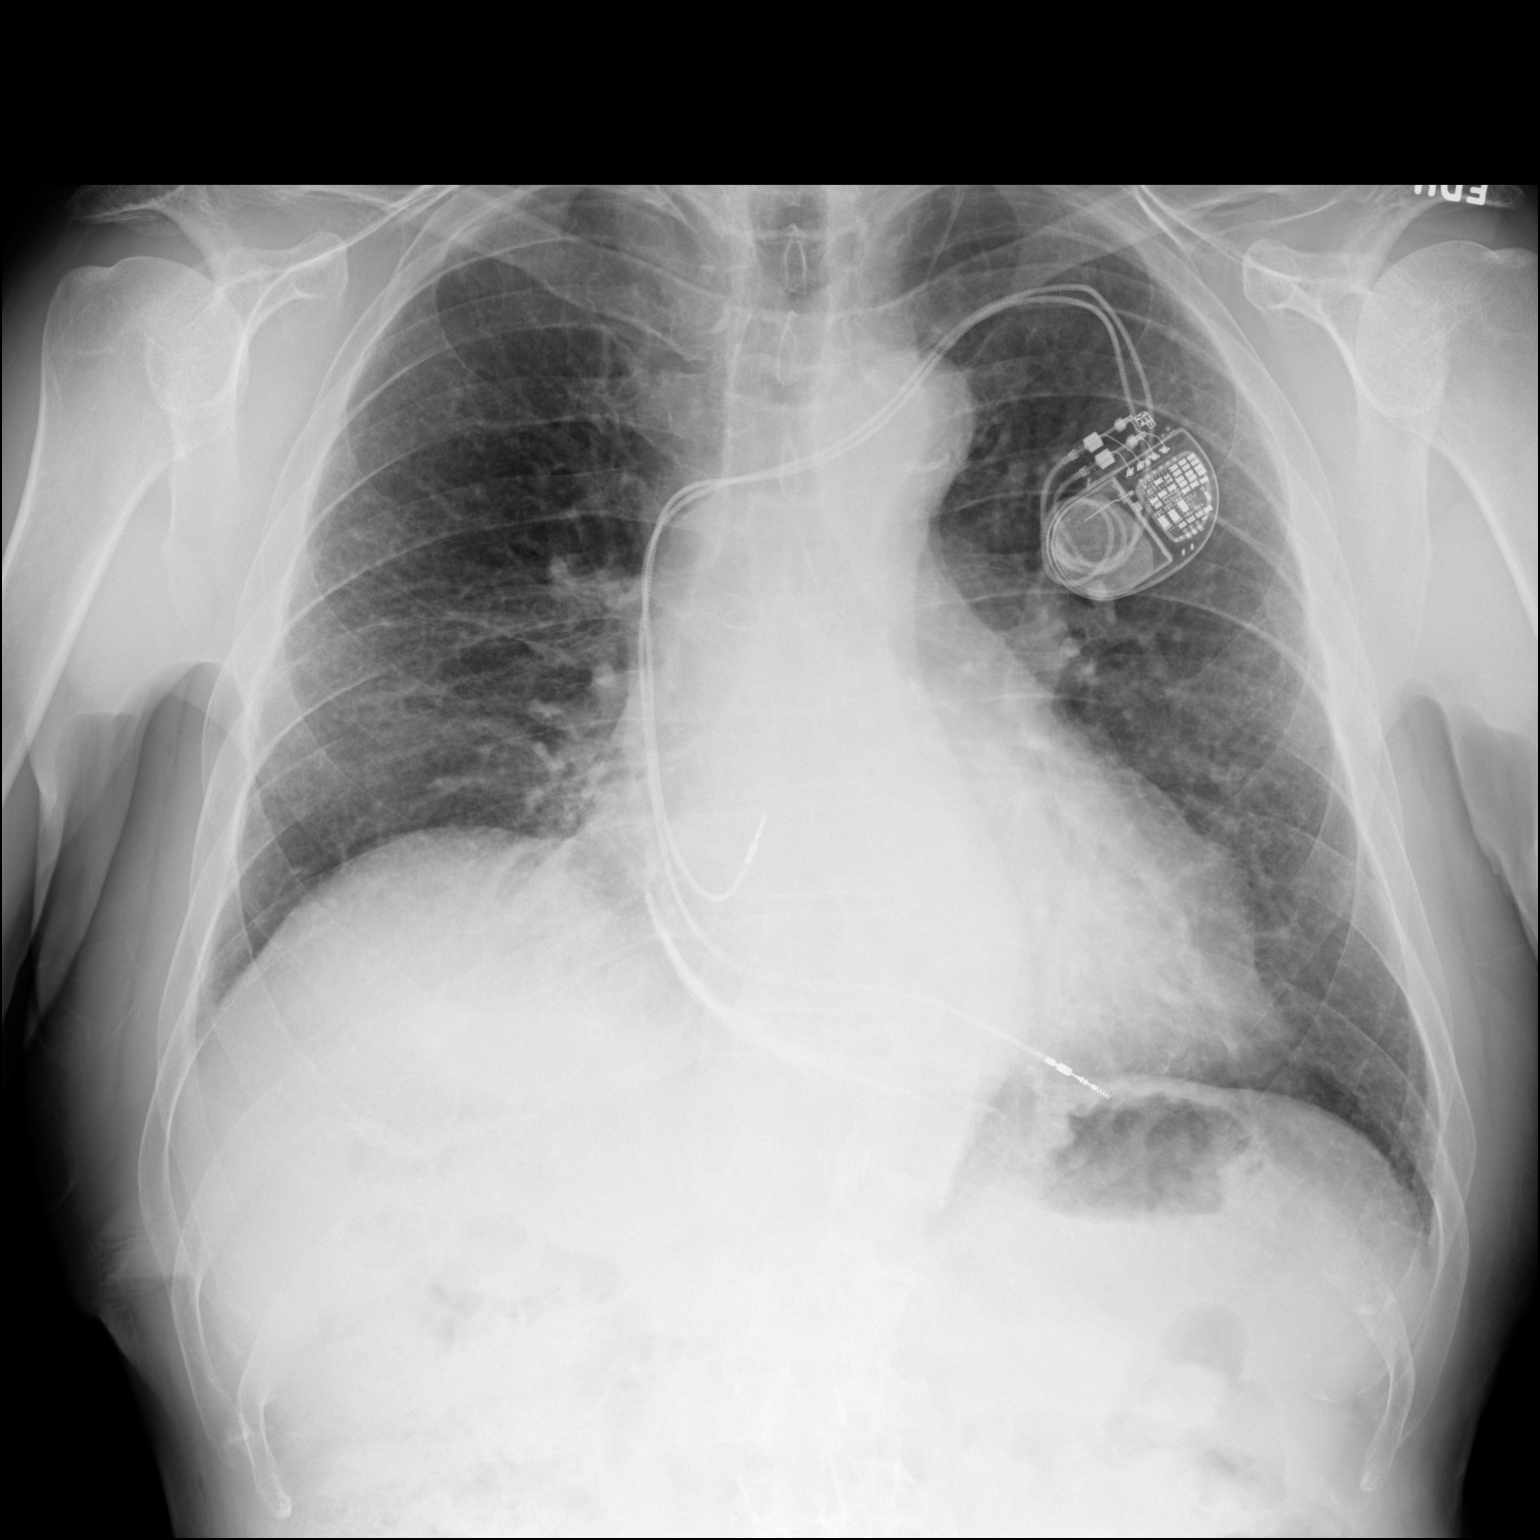

[dg chest 2 view (2 of 2)]
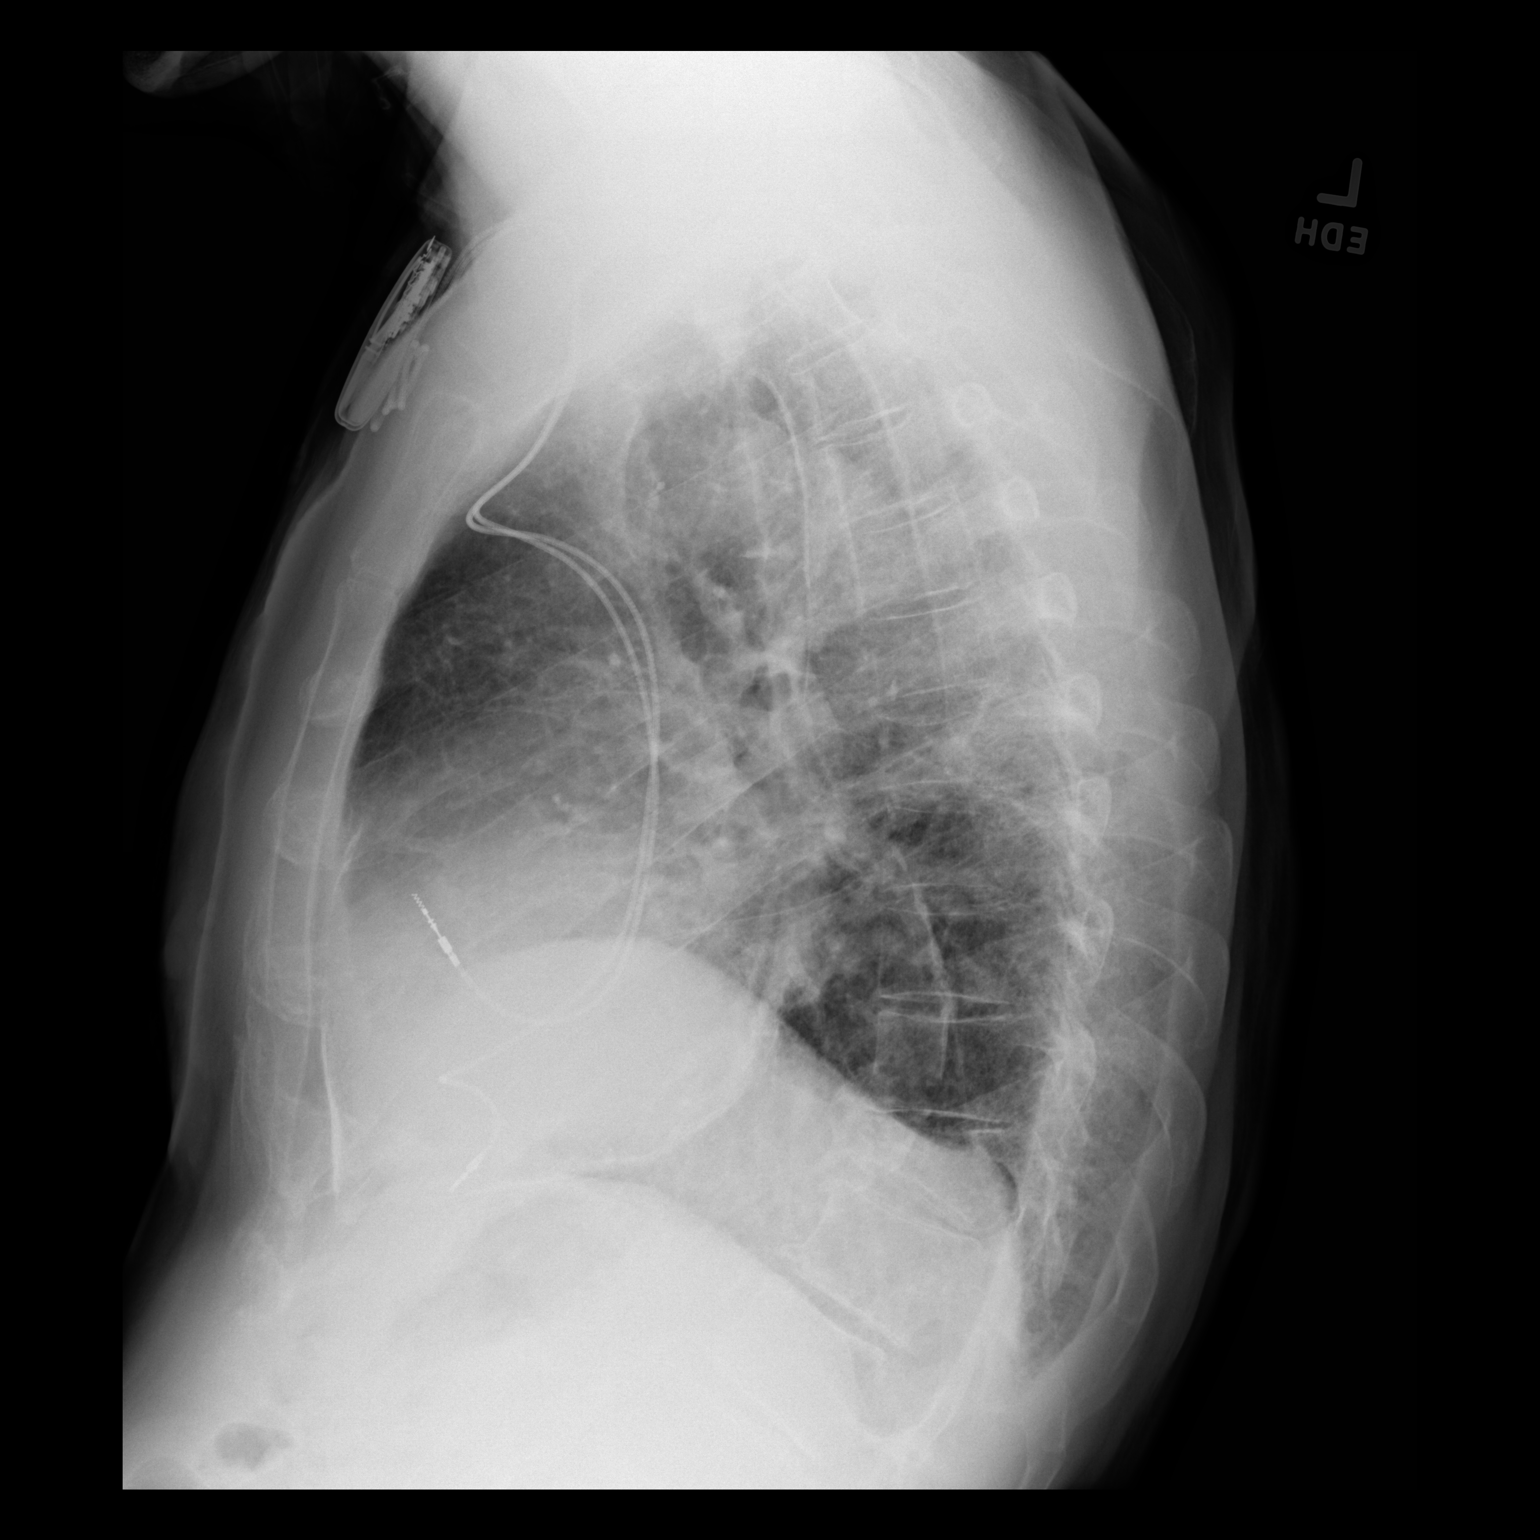

[2 of 2 positions shown; findings below may reference images not displayed]

FINDINGS: Mild volume loss on the right. The previously noted mass in the
superior segment right lower lobe no longer appreciable. There is no
edema or airspace opacity. Heart is upper normal in size with
pulmonary vascularity normal. Note calcification along much of the
right heart border, a finding indicative of prior pericarditis.
Prior myocardial infarction could contribute to calcification of
this nature as well. Pacemaker leads are attached to the right
atrium and right ventricle. There is aortic atherosclerosis. No
adenopathy evident. Severe collapse of the T11 vertebral body is
stable as is milder wedging at T7.
IMPRESSION: Postoperative change on the right with mild volume loss but no edema
or consolidation. No pneumothorax. No residual pulmonary nodular
lesion evident by radiography.

Stable cardiac silhouette. Extensive calcification along the wall
the heart likely is due to previous pericarditis. Pacemaker leads
attached to right atrium and right ventricle. Stable compression
fractures in the lower thoracic spine noted.

Aortic Atherosclerosis ([MH]-[MH]).

## 2020-03-28 NOTE — Progress Notes (Signed)
[address]       [place] [address]             [phone]      Romone Nappier Morrison Medical Record #[id number] Date of Birth: [birth year]  Referring: [name], MD Primary Care: [name], MD Primary Cardiologist: No primary care provider on file.   Chief Complaint:   POST OP FOLLOW UP OPERATIVE REPORT DATE OF PROCEDURE:  03/11/2020 PREOPERATIVE DIAGNOSIS:  Lung nodule superior segment right lower lobe. POSTOPERATIVE DIAGNOSIS:  Neuroendocrine tumor by frozen section.  Final path pending. PROCEDURE PERFORMED:  Video bronchoscopy, robotic-assisted right superior segmentectomy with lymph node sampling and intercostal nerve block.  SURGICAL PATHOLOGY  THIS IS AN ADDENDUM REPORT  CASE: [id number]  PATIENT: Alexander Murray  Surgical Pathology Report  Addendum   Reason for Addendum #1: Report Clarification   Clinical History: pulmonary nodule (cm)      FINAL MICROSCOPIC DIAGNOSIS:   A. LYMPH NODE, 8, EXCISION:  - Lymph node, negative for carcinoma (0/1)   B. 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 LYMPH NODE, 10R, EXCISION:  - Lymph node, negative for carcinoma (0/1)   D. 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 He is making good progress postoperatively. Has some mild soreness over the right lower chest. Denies any significant shortness of breath. Notes that he has been out walking with the exception of when the heat is excessive.  He notes some urinary frequency, during surgery he had a  Foley catheter placed but it was removed on the evening of surgery.-We will obtain a UA and urine culture today   Is been seen in the medical oncology office. He is to have MRI of the brain in the near future.     Past Medical History:  Diagnosis Date  . Allergy   . Arthritis    "right knee and right thumb" (12/19/2014)  . Asthma    pt denies  . Bronchitis 07/2016   started in December and continued about 2 months  . Esophageal stricture   . GERD (gastroesophageal reflux disease)   . Hemochromatosis   . Hypercalcemia   . Hypercholesteremia   . Hypertension   . Osteoarthritis   . Palpitations   . Presence of permanent cardiac pacemaker    hx bradycardia  . Retinal artery occlusion, branch    "right eye"  . Second degree AV block, Mobitz type II   . Skin cancer    right shoulder  . Stroke [facility]) ~ 2011   "right eye"/ partial blindness     Social History   Tobacco Use  Smoking Status Former Smoker  . Years: 3.00  . Types: Cigarettes  . Quit date: 05/16/1962  . Years since quitting: 57.9  Smokeless Tobacco Never Used  Tobacco Comment   "quit smoking in the 1960's"; was an intermittent smoker     Social History   Substance and Sexual Activity  Alcohol Use No   Comment: 12/19/2014 "stopped drinking in ~ 2012"  No Known Allergies  Current Outpatient Medications  Medication Sig Dispense Refill  . acetaminophen (TYLENOL) 325 MG tablet Take 2 tablets (650 mg total) by mouth every 6 (six) hours as needed for mild pain or moderate pain.    Marland Kitchen amLODipine (NORVASC) 2.5 MG tablet Take 2.5 mg by mouth at bedtime.    Marland Kitchen aspirin 81 MG tablet Take 81 mg by mouth daily.    . celecoxib (CELEBREX) 200 MG capsule Take 1 capsule (200 mg total) by mouth 2 (two) times daily as needed for mild pain. 10 capsule 0  . chlorthalidone (HYGROTON) 25 MG tablet Take 25 mg by mouth every other day.     . clopidogrel (PLAVIX) 75 MG tablet Take 75 mg by mouth every other day.    . doxazosin  (CARDURA) 4 MG tablet Take 4 mg by mouth daily.     Marland Kitchen esomeprazole (NEXIUM) 20 MG packet Take 20 mg by mouth daily before breakfast.    . Misc Natural Products (GLUCOSAMINE CHONDROITIN ADV PO) Take 1,500 mg by mouth daily.    . montelukast (SINGULAIR) 10 MG tablet Take 10 mg by mouth every morning.     . Multiple Vitamin (MULTIVITAMIN) capsule Take 1 capsule by mouth daily.    . Multiple Vitamins-Minerals (PRESERVISION AREDS 2 PO) Take 1 capsule by mouth daily.    . prochlorperazine (COMPAZINE) 10 MG tablet Take 1 tablet (10 mg total) by mouth every 6 (six) hours as needed for nausea or vomiting. 30 tablet 0  . simvastatin (ZOCOR) 40 MG tablet Take 40 mg by mouth daily.    . vitamin C (ASCORBIC ACID) 500 MG tablet Take 500 mg by mouth daily.     . traMADol (ULTRAM) 50 MG tablet Take 1-2 tablets (50-100 mg total) by mouth every 6 (six) hours as needed (mild pain). 30 tablet 0   No current facility-administered medications for this visit.       Physical Exam: BP 132/77   Pulse 64   Temp 97.8 F (36.6 C) (Skin)   Resp 20   Ht 5\' 10"  (1.778 m)   Wt 156 lb (70.8 kg)   SpO2 97% Comment: RA  BMI 22.38 kg/m   General appearance: alert, cooperative and no distress Neurologic: intact Heart: regular rate and rhythm, S1, S2 normal, no murmur, click, rub or gallop Lungs: clear to auscultation bilaterally Abdomen: soft, non-tender; bowel sounds normal; no masses,  no organomegaly Extremities: extremities normal, atraumatic, no cyanosis or edema and Homans sign is negative, no sign of DVT Wound: Port sites and chest tube sites are all well-healed, chest tube suture has been removed by his wife   Diagnostic Studies & Laboratory data:     Recent Radiology Findings:   DG Chest 2 View  Result Date: 03/28/2020 CLINICAL DATA:  Reported recent robotic assisted thoracoscopy for lung carcinoma EXAM: CHEST - 2 VIEW COMPARISON:  March 13, 2020 chest radiograph; PET-CT February 09, 2020. Thoracic CT  March 12, 2017 FINDINGS: Mild volume loss on the right. The previously noted mass in the superior segment right lower lobe no longer appreciable. There is no edema or airspace opacity. Heart is upper normal in size with pulmonary vascularity normal. Note calcification along much of the right heart border, a finding indicative of prior pericarditis. Prior myocardial infarction could contribute to calcification of this nature as well. Pacemaker leads are attached to the right atrium and right ventricle. There is aortic atherosclerosis. No adenopathy evident. Severe collapse of the T11 vertebral  body is stable as is milder wedging at T7. IMPRESSION: Postoperative change on the right with mild volume loss but no edema or consolidation. No pneumothorax. No residual pulmonary nodular lesion evident by radiography. Stable cardiac silhouette. Extensive calcification along the wall the heart likely is due to previous pericarditis. Pacemaker leads attached to right atrium and right ventricle. Stable compression fractures in the lower thoracic spine noted. Aortic Atherosclerosis (ICD10-I70.0). Electronically Signed   By: Lowella Grip III M.D.   On: 03/28/2020 10:42    I have independently reviewed the above radiology studies  and reviewed the findings with the patient.    Recent Lab Findings: Lab Results  Component Value Date   WBC 5.9 03/19/2020   HGB 13.2 03/19/2020   HCT 39.3 03/19/2020   PLT 165 03/19/2020   GLUCOSE 136 (H) 03/19/2020   ALT 23 03/19/2020   AST 21 03/19/2020   NA 140 03/19/2020   K 3.6 03/19/2020   CL 105 03/19/2020   CREATININE 0.91 03/19/2020   BUN 13 03/19/2020   CO2 27 03/19/2020   TSH 1.95 12/05/2014   INR 1.2 03/11/2020      Assessment / Plan:   #1 stable following wedge resection right lower lobe lung mass-pathologically found to be small cell carcinoma of the lung #2 plan MRI of the brain to evaluation of the small cell carcinoma. Patient to start formal course of  chemotherapy in the near future #3 with the patient's urinary frequency will obtain a UA urine culture  We will plan to see him back in 3 months with a chest x-ray   Medication Changes: No orders of the defined types were placed in this encounter.     Grace Isaac MD      [address] [place] [address] Office [phone]     03/28/2020 12:01 PM

## 2020-03-29 ENCOUNTER — Encounter (HOSPITAL_COMMUNITY): Payer: Self-pay

## 2020-04-01 ENCOUNTER — Encounter (HOSPITAL_COMMUNITY): Payer: Self-pay

## 2020-04-01 ENCOUNTER — Encounter: Payer: Self-pay | Admitting: *Deleted

## 2020-04-01 ENCOUNTER — Ambulatory Visit (HOSPITAL_COMMUNITY): Admission: RE | Admit: 2020-04-01 | Payer: Medicare Other | Source: Ambulatory Visit

## 2020-04-01 ENCOUNTER — Telehealth: Payer: Self-pay | Admitting: Medical Oncology

## 2020-04-01 NOTE — Telephone Encounter (Signed)
Confimred appt for next week. MRI delayed to 9/13 due to he has a pacemaker.

## 2020-04-01 NOTE — Progress Notes (Signed)
Oncology Nurse Navigator Documentation  Oncology Nurse Navigator Flowsheets 04/01/2020  Abnormal Finding Date 03/13/2020  Confirmed Diagnosis Date 03/11/2020  Diagnosis Status Confirmed Diagnosis Complete  Planned Course of Treatment Chemotherapy  Phase of Treatment Chemo  Chemotherapy Actual Start Date: 04/09/2020  Surgery Actual Start Date: 03/11/2020  Navigator Follow Up Date: 04/01/2020  Navigator Follow Up Reason: Appointment Review  Navigator Location [facility]  Navigator Encounter Type Other:  Treatment Initiated Date 03/11/2020  Patient Visit Type Other  Treatment Phase Other  Barriers/Navigation Needs No Barriers At This Time  Interventions None Required  Acuity Level 1-No Barriers  Time Spent with Patient 15

## 2020-04-02 NOTE — Progress Notes (Signed)
Pharmacist Chemotherapy Monitoring - Initial Assessment    Anticipated start date: 04/09/2020   Regimen:  . Are orders appropriate based on the patient's diagnosis, regimen, and cycle? Yes . Does the plan date match the patient's scheduled date? Yes . Is the sequencing of drugs appropriate? Yes . Are the premedications appropriate for the patient's regimen? Yes . Prior Authorization for treatment is: Approved o If applicable, is the correct biosimilar selected based on the patient's insurance? not applicable  Organ Function and Labs: Marland Kitchen Are dose adjustments needed based on the patient's renal function, hepatic function, or hematologic function? Yes . Are appropriate labs ordered prior to the start of patient's treatment? Yes . Other organ system assessment, if indicated: N/A . The following baseline labs, if indicated, have been ordered: N/A  Dose Assessment: . Are the drug doses appropriate? Yes . Are the following correct: o Drug concentrations Yes o IV fluid compatible with drug Yes o Administration routes Yes o Timing of therapy Yes . If applicable, does the patient have documented access for treatment and/or plans for port-a-cath placement? yes . If applicable, have lifetime cumulative doses been properly documented and assessed? yes Lifetime Dose Tracking  No doses have been documented on this patient for the following tracked chemicals: Doxorubicin, Epirubicin, Idarubicin, Daunorubicin, Mitoxantrone, Bleomycin, Oxaliplatin, Carboplatin, Liposomal Doxorubicin  o   Toxicity Monitoring/Prevention: . The patient has the following take home antiemetics prescribed: Prochlorperazine . The patient has the following take home medications prescribed: N/A . Medication allergies and previous infusion related reactions, if applicable, have been reviewed and addressed. No . The patient's current medication list has been assessed for drug-drug interactions with their chemotherapy regimen. no  significant drug-drug interactions were identified on review.  Order Review: . Are the treatment plan orders signed? No . Is the patient scheduled to see a provider prior to their treatment? Yes  I verify that I have reviewed each item in the above checklist and answered each question accordingly.  Leighton Luster D 04/02/2020 3:26 PM

## 2020-04-02 NOTE — Progress Notes (Signed)
[facility] OFFICE PROGRESS NOTE  Alexander Battles, Alexander Murray [place] [address]  DIAGNOSIS: Stage Ib (T2 a, N0, M0) small cell lung cancer presented with superior segment right lower lobe lung nodule. Diagnosed in August 2021  PRIOR THERAPY: 1) status post right lower lobe superior segmentectomy with lymph node sampling on March 12, 2020 under the care of Dr. Servando Snare.  CURRENT THERAPY: Systemic chemotherapy with carboplatin for AUC of 5 on day 1, etoposide 100 mg/M2 on days 1, 2 and 3 with Neulasta support on day 5. First dose expected on 04/09/20  INTERVAL HISTORY: Alexander Murray 79 y.o. male returns to the clinic for a follow up visit. The patient is feeling well today without any concerning complaints. The patient is scheduled to receive his first dose of adjuvant  treatment today. The plan is to complete 4 cycles. Denies any fever, chills, night sweats, or weight loss. He reports mild soreness from his surgery site. He notes yesterday was the first time since his surgery that he felt like he was breathing normal. Denies hemoptysis or significant cough. Denies any nausea, vomiting, diarrhea, or constipation. Denies any headache or visual changes. He is scheduled for his brain MRI on 04/15/20. The patient is here today for evaluation prior to starting cycle # 1   MEDICAL HISTORY: Past Medical History:  Diagnosis Date  . Allergy   . Arthritis    "right knee and right thumb" (12/19/2014)  . Asthma    pt denies  . Bronchitis 07/2016   started in December and continued about 2 months  . Esophageal stricture   . GERD (gastroesophageal reflux disease)   . Hemochromatosis   . Hypercalcemia   . Hypercholesteremia   . Hypertension   . Osteoarthritis   . Palpitations   . Presence of permanent cardiac pacemaker    hx bradycardia  . Retinal artery occlusion, branch    "right eye"  . Second degree AV block, Mobitz type II   . Skin cancer    right shoulder  .  Stroke [facility]) ~ 2011   "right eye"/ partial blindness    ALLERGIES:  has No Known Allergies.  MEDICATIONS:  Current Outpatient Medications  Medication Sig Dispense Refill  . acetaminophen (TYLENOL) 325 MG tablet Take 2 tablets (650 mg total) by mouth every 6 (six) hours as needed for mild pain or moderate pain.    Marland Kitchen amLODipine (NORVASC) 2.5 MG tablet Take 2.5 mg by mouth at bedtime.    Marland Kitchen aspirin 81 MG tablet Take 81 mg by mouth daily.    . celecoxib (CELEBREX) 200 MG capsule Take 1 capsule (200 mg total) by mouth 2 (two) times daily as needed for mild pain. 10 capsule 0  . chlorthalidone (HYGROTON) 25 MG tablet Take 25 mg by mouth every other day.     . clopidogrel (PLAVIX) 75 MG tablet Take 75 mg by mouth every other day.    . doxazosin (CARDURA) 4 MG tablet Take 4 mg by mouth daily.     Marland Kitchen esomeprazole (NEXIUM) 20 MG packet Take 20 mg by mouth daily before breakfast.    . Misc Natural Products (GLUCOSAMINE CHONDROITIN ADV PO) Take 1,500 mg by mouth daily.    . montelukast (SINGULAIR) 10 MG tablet Take 10 mg by mouth every morning.     . Multiple Vitamin (MULTIVITAMIN) capsule Take 1 capsule by mouth daily.    . Multiple Vitamins-Minerals (PRESERVISION AREDS 2 PO) Take 1 capsule by mouth daily.    Marland Kitchen  prochlorperazine (COMPAZINE) 10 MG tablet Take 1 tablet (10 mg total) by mouth every 6 (six) hours as needed for nausea or vomiting. 30 tablet 0  . simvastatin (ZOCOR) 40 MG tablet Take 40 mg by mouth daily.    . vitamin C (ASCORBIC ACID) 500 MG tablet Take 500 mg by mouth daily.      No current facility-administered medications for this visit.    SURGICAL HISTORY:  Past Surgical History:  Procedure Laterality Date  . CATARACT EXTRACTION, BILATERAL  01/2017  . COLONOSCOPY    . EP IMPLANTABLE DEVICE N/A 12/19/2014   Procedure: Pacemaker Implant;  Surgeon: Deboraha Sprang, Alexander Murray;  Location: [facility];  Service: Cardiovascular;  Laterality: N/A;  . ESOPHAGOGASTRODUODENOSCOPY (EGD) WITH  ESOPHAGEAL DILATION  X 3  . EYE SURGERY Bilateral 2017  . INGUINAL HERNIA REPAIR Left 1980's  . INSERT / REPLACE / REMOVE PACEMAKER  12/19/2014  . INTERCOSTAL NERVE BLOCK Right 03/11/2020   Procedure: INTERCOSTAL NERVE BLOCK;  Surgeon: Grace Isaac, Alexander Murray;  Location: [facility];  Service: Thoracic;  Laterality: Right;  . JOINT REPLACEMENT Right 2016  . KNEE ARTHROSCOPY Right ~ 1982; ~ 1992  . MOHS SURGERY Right ~ 2014   "pre-melanoma scapula"  . POSTERIOR TIBIAL TENDON REPAIR Left 2012  . TONSILLECTOMY  1946  . TOTAL KNEE ARTHROPLASTY Right 05/20/2015   Procedure: RIGHT TOTAL KNEE ARTHROPLASTY;  Surgeon: Paralee Cancel, Alexander Murray;  Location: [facility];  Service: Orthopedics;  Laterality: Right;  . UPPER GASTROINTESTINAL ENDOSCOPY    . VIDEO BRONCHOSCOPY N/A 03/11/2020   Procedure: VIDEO BRONCHOSCOPY;  Surgeon: Grace Isaac, Alexander Murray;  Location: [facility];  Service: Thoracic;  Laterality: N/A;  . XI ROBOTIC ASSISTED THORACOSCOPY- SEGMENTECTOMY Right 03/11/2020   Procedure: XI ROBOTIC ASSISTED THORACOSCOPY-RIGHT SUPERIOR SEGMENTECTOMY WITH NODE SAMPLES;  Surgeon: Grace Isaac, Alexander Murray;  Location: [facility];  Service: Thoracic;  Laterality: Right;    REVIEW OF SYSTEMS:   Review of Systems  Constitutional: Negative for appetite change, chills, fatigue, fever and unexpected weight change.  HENT: Negative for mouth sores, nosebleeds, sore throat and trouble swallowing.   Eyes: Negative for eye problems and icterus.  Respiratory: Negative for cough, hemoptysis, shortness of breath and wheezing.   Cardiovascular: Negative for chest pain and leg swelling.  Gastrointestinal: Negative for abdominal pain, constipation, diarrhea, nausea and vomiting.  Genitourinary: Negative for bladder incontinence, difficulty urinating, dysuria, frequency and hematuria.   Musculoskeletal: Negative for back pain, gait problem, neck pain and neck stiffness.  Skin: Negative for itching and rash.  Neurological: Negative for dizziness,  extremity weakness, gait problem, headaches, light-headedness and seizures.  Hematological: Negative for adenopathy. Does not bruise/bleed easily.  Psychiatric/Behavioral: Negative for confusion, depression and sleep disturbance. The patient is not nervous/anxious.     PHYSICAL EXAMINATION:  Blood pressure (!) 144/83, pulse 79, temperature 98.1 F (36.7 C), temperature source Tympanic, resp. rate 18, height 5\' 10"  (1.778 m), weight 161 lb 1.6 oz (73.1 kg), SpO2 96 %.  ECOG PERFORMANCE STATUS: 1 - Symptomatic but completely ambulatory  Physical Exam  Constitutional: Oriented to person, place, and time and well-developed, well-nourished, and in no distress.  HENT:  Head: Normocephalic and atraumatic.  Mouth/Throat: Oropharynx is clear and moist. No oropharyngeal exudate.  Eyes: Conjunctivae are normal. Right eye exhibits no discharge. Left eye exhibits no discharge. No scleral icterus.  Neck: Normal range of motion. Neck supple.  Cardiovascular: Normal rate, regular rhythm, normal heart sounds and intact distal pulses.   Pulmonary/Chest: Effort normal  and breath sounds normal. No respiratory distress. No wheezes. No rales.  Abdominal: Soft. Bowel sounds are normal. Exhibits no distension and no mass. There is no tenderness.  Musculoskeletal: Normal range of motion. Exhibits no edema.  Lymphadenopathy:    No cervical adenopathy.  Neurological: Alert and oriented to person, place, and time. Exhibits normal muscle tone. Gait normal. Coordination normal.  Skin: Skin is warm and dry. No rash noted. Not diaphoretic. No erythema. No pallor.  Psychiatric: Mood, memory and judgment normal.  Vitals reviewed.  LABORATORY DATA: Lab Results  Component Value Date   WBC 5.5 04/09/2020   HGB 13.5 04/09/2020   HCT 40.6 04/09/2020   MCV 91.0 04/09/2020   PLT 141 (L) 04/09/2020      Chemistry      Component Value Date/Time   NA 137 04/09/2020 0816   K 3.7 04/09/2020 0816   CL 107 04/09/2020  0816   CO2 25 04/09/2020 0816   BUN 16 04/09/2020 0816   CREATININE 0.93 04/09/2020 0816      Component Value Date/Time   CALCIUM 10.4 (H) 04/09/2020 0816   ALKPHOS 160 (H) 04/09/2020 0816   AST 26 04/09/2020 0816   ALT 22 04/09/2020 0816   BILITOT 1.1 04/09/2020 0816       RADIOGRAPHIC STUDIES:  DG Chest 2 View  Result Date: 03/28/2020 CLINICAL DATA:  Reported recent robotic assisted thoracoscopy for lung carcinoma EXAM: CHEST - 2 VIEW COMPARISON:  March 13, 2020 chest radiograph; PET-CT February 09, 2020. Thoracic CT March 12, 2017 FINDINGS: Mild volume loss on the right. The previously noted mass in the superior segment right lower lobe no longer appreciable. There is no edema or airspace opacity. Heart is upper normal in size with pulmonary vascularity normal. Note calcification along much of the right heart border, a finding indicative of prior pericarditis. Prior myocardial infarction could contribute to calcification of this nature as well. Pacemaker leads are attached to the right atrium and right ventricle. There is aortic atherosclerosis. No adenopathy evident. Severe collapse of the T11 vertebral body is stable as is milder wedging at T7. IMPRESSION: Postoperative change on the right with mild volume loss but no edema or consolidation. No pneumothorax. No residual pulmonary nodular lesion evident by radiography. Stable cardiac silhouette. Extensive calcification along the wall the heart likely is due to previous pericarditis. Pacemaker leads attached to right atrium and right ventricle. Stable compression fractures in the lower thoracic spine noted. Aortic Atherosclerosis (ICD10-I70.0). Electronically Signed   By: Lowella Grip III M.D.   On: 03/28/2020 10:42   DG Chest 2 View  Result Date: 03/13/2020 CLINICAL DATA:  Chest tube removal EXAM: CHEST - 2 VIEW COMPARISON:  03/13/2020 FINDINGS: Interval removal of right chest tube. No visible pneumothorax. Left pacer remains in place,  unchanged. Bibasilar opacities, likely atelectasis. Pericardial calcifications noted. IMPRESSION: Interval removal of right chest tube without visible pneumothorax. Otherwise no change. Electronically Signed   By: Rolm Baptise M.D.   On: 03/13/2020 15:44   CT HEAD WO CONTRAST  Result Date: 03/13/2020 CLINICAL DATA:  Brain/CNS neoplasm, surveillance. No headache, lung cancer. EXAM: CT HEAD WITHOUT CONTRAST TECHNIQUE: Contiguous axial images were obtained from the base of the skull through the vertex without intravenous contrast. COMPARISON:  PET-CT 02/09/2020. FINDINGS: Brain: Please note there is limited assessment for intracranial metastatic disease on this noncontrast head CT. Mild generalized parenchymal atrophy. Mild ill-defined hypoattenuation within the cerebral white matter, which is nonspecific, but consistent with chronic small vessel ischemic  disease. There is no acute intracranial hemorrhage. No demarcated cortical infarct. No extra-axial fluid collection. No evidence of intracranial mass. No midline shift. Vascular: No hyperdense vessel.  Atherosclerotic calcifications Skull: Normal. Negative for fracture or focal lesion. Sinuses/Orbits: Visualized orbits show no acute finding. Minimal ethmoid and left maxillary sinus mucosal thickening. No significant mastoid effusion. IMPRESSION: Please note there is limited assessment for intracranial metastatic disease on this non-contrast head CT. If the patient is unable to have a brain MRI, consider contrast-enhanced CT imaging of the brain for further evaluation. No non-contrast CT evidence of acute intracranial abnormality. Mild generalized parenchymal atrophy and chronic small vessel ischemic disease. Minimal ethmoid and left maxillary sinus mucosal thickening. Electronically Signed   By: Kellie Simmering DO   On: 03/13/2020 08:22   DG CHEST PORT 1 VIEW  Result Date: 03/13/2020 CLINICAL DATA:  Status post lobectomy. EXAM: PORTABLE CHEST 1 VIEW COMPARISON:   03/12/2020 chest radiograph and prior. FINDINGS: Apically terminating right chest tube is unchanged. Prior tiny right basilar pneumothorax is not demonstrated on the current exam. Bibasilar patchy opacities, likely atelectasis. No pleural effusion. Stable cardiomediastinal silhouette. Left chest wall pacing device with leads terminating over the right heart. IMPRESSION: Prior tiny right basilar pneumothorax is not demonstrated on the current exam. Indwelling right chest tube, unchanged in positioning. Electronically Signed   By: Primitivo Gauze M.D.   On: 03/13/2020 08:20   DG CHEST PORT 1 VIEW  Result Date: 03/12/2020 CLINICAL DATA:  Postop from right lower lobectomy. EXAM: PORTABLE CHEST 1 VIEW COMPARISON:  03/11/2020 FINDINGS: Right-sided chest tube remains in place. A tiny approximately 5-10% right basilar pneumothorax is seen. Mild right basilar atelectasis is noted. No evidence of pulmonary consolidation or pleural effusion. Heart size is stable. IMPRESSION: Tiny approximately 5-10% right basilar pneumothorax. Mild right basilar atelectasis. Electronically Signed   By: Marlaine Hind M.D.   On: 03/12/2020 08:30   DG Chest Port 1 View  Result Date: 03/11/2020 CLINICAL DATA:  Status post partial lobectomy. Chest tube placement. EXAM: PORTABLE CHEST 1 VIEW COMPARISON:  03/08/2020 FINDINGS: There are postsurgical changes in the right lower lung from interval partial lobectomy. There is a right-sided chest tube directed towards the apex. There is no focal consolidation. There is mild right basilar atelectasis. There is no pleural effusion or pneumothorax. The heart and mediastinal contours are unremarkable. There is a dual lead cardiac pacemaker. There is no acute osseous abnormality. IMPRESSION: 1. Right-sided chest tube directed towards the apex. No pneumothorax. 2. Right basilar atelectasis. Electronically Signed   By: Kathreen Devoid   On: 03/11/2020 11:06     ASSESSMENT/PLAN:  This is a very  pleasant 79 year old Caucasian male recently diagnosed with a stage Ib (T2 a, N0, M0) small cell lung cancer presented with superior segment right lower lobe lung nodule status post right lower lobe superior segmentectomy with lymph node sampling on March 12, 2020 under the care of Dr. Servando Snare.  The plan is to undergo 4 cycles of adjuvant systemic chemotherapy with carboplatin for AUC of 5 on day 1, etoposide 100 mg/M2 on days 1, 2 and 3 with Neulasta support on day 5. He is scheduled to receive his first dose of treatment today.   Labs were reviewed. Recommend that he proceed with cycle #1 today as scheduled.   We will see him back for a follow up visit in 1 week for evaluation and to manage any adverse side effects of treatment.   The patient was advised  to call immediately if he has any concerning symptoms in the interval. The patient voices understanding of current disease status and treatment options and is in agreement with the current care plan. All questions were answered. The patient knows to call the clinic with any problems, questions or concerns. We can certainly see the patient much sooner if necessary      No orders of the defined types were placed in this encounter.    [facility] Murray L Dayna Alia, PA-C 04/09/20

## 2020-04-05 ENCOUNTER — Other Ambulatory Visit: Payer: Self-pay | Admitting: Physician Assistant

## 2020-04-05 DIAGNOSIS — C3431 Malignant neoplasm of lower lobe, right bronchus or lung: Secondary | ICD-10-CM

## 2020-04-09 ENCOUNTER — Other Ambulatory Visit: Payer: Self-pay

## 2020-04-09 ENCOUNTER — Inpatient Hospital Stay: Payer: Medicare Other

## 2020-04-09 ENCOUNTER — Encounter: Payer: Self-pay | Admitting: Internal Medicine

## 2020-04-09 ENCOUNTER — Inpatient Hospital Stay: Payer: Medicare Other | Attending: Physician Assistant | Admitting: Physician Assistant

## 2020-04-09 VITALS — BP 144/83 | HR 79 | Temp 98.1°F | Resp 18 | Ht 70.0 in | Wt 161.1 lb

## 2020-04-09 DIAGNOSIS — Z85828 Personal history of other malignant neoplasm of skin: Secondary | ICD-10-CM | POA: Diagnosis not present

## 2020-04-09 DIAGNOSIS — J45909 Unspecified asthma, uncomplicated: Secondary | ICD-10-CM | POA: Diagnosis not present

## 2020-04-09 DIAGNOSIS — R5383 Other fatigue: Secondary | ICD-10-CM | POA: Insufficient documentation

## 2020-04-09 DIAGNOSIS — I10 Essential (primary) hypertension: Secondary | ICD-10-CM | POA: Insufficient documentation

## 2020-04-09 DIAGNOSIS — Z9221 Personal history of antineoplastic chemotherapy: Secondary | ICD-10-CM | POA: Insufficient documentation

## 2020-04-09 DIAGNOSIS — Z5111 Encounter for antineoplastic chemotherapy: Secondary | ICD-10-CM | POA: Insufficient documentation

## 2020-04-09 DIAGNOSIS — C3431 Malignant neoplasm of lower lobe, right bronchus or lung: Secondary | ICD-10-CM | POA: Insufficient documentation

## 2020-04-09 DIAGNOSIS — M1990 Unspecified osteoarthritis, unspecified site: Secondary | ICD-10-CM | POA: Insufficient documentation

## 2020-04-09 DIAGNOSIS — Z8673 Personal history of transient ischemic attack (TIA), and cerebral infarction without residual deficits: Secondary | ICD-10-CM | POA: Diagnosis not present

## 2020-04-09 DIAGNOSIS — Z79899 Other long term (current) drug therapy: Secondary | ICD-10-CM | POA: Insufficient documentation

## 2020-04-09 DIAGNOSIS — I441 Atrioventricular block, second degree: Secondary | ICD-10-CM | POA: Diagnosis not present

## 2020-04-09 DIAGNOSIS — E7800 Pure hypercholesterolemia, unspecified: Secondary | ICD-10-CM | POA: Insufficient documentation

## 2020-04-09 DIAGNOSIS — K219 Gastro-esophageal reflux disease without esophagitis: Secondary | ICD-10-CM | POA: Insufficient documentation

## 2020-04-09 LAB — CBC WITH DIFFERENTIAL (CANCER CENTER ONLY)
Abs Immature Granulocytes: 0.01 10*3/uL (ref 0.00–0.07)
Basophils Absolute: 0.1 10*3/uL (ref 0.0–0.1)
Basophils Relative: 1 %
Eosinophils Absolute: 0.4 10*3/uL (ref 0.0–0.5)
Eosinophils Relative: 7 %
HCT: 40.6 % (ref 39.0–52.0)
Hemoglobin: 13.5 g/dL (ref 13.0–17.0)
Immature Granulocytes: 0 %
Lymphocytes Relative: 26 %
Lymphs Abs: 1.4 10*3/uL (ref 0.7–4.0)
MCH: 30.3 pg (ref 26.0–34.0)
MCHC: 33.3 g/dL (ref 30.0–36.0)
MCV: 91 fL (ref 80.0–100.0)
Monocytes Absolute: 0.6 10*3/uL (ref 0.1–1.0)
Monocytes Relative: 11 %
Neutro Abs: 3.1 10*3/uL (ref 1.7–7.7)
Neutrophils Relative %: 55 %
Platelet Count: 141 10*3/uL — ABNORMAL LOW (ref 150–400)
RBC: 4.46 MIL/uL (ref 4.22–5.81)
RDW: 14 % (ref 11.5–15.5)
WBC Count: 5.5 10*3/uL (ref 4.0–10.5)
nRBC: 0 % (ref 0.0–0.2)

## 2020-04-09 LAB — CMP (CANCER CENTER ONLY)
ALT: 22 U/L (ref 0–44)
AST: 26 U/L (ref 15–41)
Albumin: 3.8 g/dL (ref 3.5–5.0)
Alkaline Phosphatase: 160 U/L — ABNORMAL HIGH (ref 38–126)
Anion gap: 5 (ref 5–15)
BUN: 16 mg/dL (ref 8–23)
CO2: 25 mmol/L (ref 22–32)
Calcium: 10.4 mg/dL — ABNORMAL HIGH (ref 8.9–10.3)
Chloride: 107 mmol/L (ref 98–111)
Creatinine: 0.93 mg/dL (ref 0.61–1.24)
GFR, Est AFR Am: >60 mL/min (ref >60)
GFR, Estimated: >60 mL/min (ref >60)
Glucose, Bld: 118 mg/dL — ABNORMAL HIGH (ref 70–99)
Potassium: 3.7 mmol/L (ref 3.5–5.1)
Sodium: 137 mmol/L (ref 135–145)
Total Bilirubin: 1.1 mg/dL (ref 0.3–1.2)
Total Protein: 7 g/dL (ref 6.5–8.1)

## 2020-04-09 MED ORDER — SODIUM CHLORIDE 0.9 % IV SOLN
100.0000 mg/m2 | Freq: Once | INTRAVENOUS | Status: AC
  Administered 2020-04-09: 190 mg via INTRAVENOUS
  Filled 2020-04-09: qty 9.5

## 2020-04-09 MED ORDER — SODIUM CHLORIDE 0.9 % IV SOLN
427.5000 mg | Freq: Once | INTRAVENOUS | Status: AC
  Administered 2020-04-09: 430 mg via INTRAVENOUS
  Filled 2020-04-09: qty 43

## 2020-04-09 MED ORDER — SODIUM CHLORIDE 0.9 % IV SOLN
150.0000 mg | Freq: Once | INTRAVENOUS | Status: AC
  Administered 2020-04-09: 150 mg via INTRAVENOUS
  Filled 2020-04-09: qty 150

## 2020-04-09 MED ORDER — SODIUM CHLORIDE 0.9 % IV SOLN
Freq: Once | INTRAVENOUS | Status: AC
  Filled 2020-04-09: qty 250

## 2020-04-09 MED ORDER — SODIUM CHLORIDE 0.9 % IV SOLN
10.0000 mg | Freq: Once | INTRAVENOUS | Status: AC
  Administered 2020-04-09: 10 mg via INTRAVENOUS
  Filled 2020-04-09: qty 10

## 2020-04-09 MED ORDER — PALONOSETRON HCL INJECTION 0.25 MG/5ML
0.2500 mg | Freq: Once | INTRAVENOUS | Status: AC
  Administered 2020-04-09: 0.25 mg via INTRAVENOUS

## 2020-04-09 MED ORDER — PALONOSETRON HCL INJECTION 0.25 MG/5ML
INTRAVENOUS | Status: AC
  Filled 2020-04-09: qty 5

## 2020-04-09 NOTE — Patient Instructions (Signed)
[facility] Discharge Instructions for Patients Receiving Chemotherapy  Today you received the following chemotherapy agents Etoposide and Carboplatin  To help prevent nausea and vomiting after your treatment, we encourage you to take your nausea medication as directed.   If you develop nausea and vomiting that is not controlled by your nausea medication, call the clinic.   BELOW ARE SYMPTOMS THAT SHOULD BE REPORTED IMMEDIATELY:  *FEVER GREATER THAN 100.5 F  *CHILLS WITH OR WITHOUT FEVER  NAUSEA AND VOMITING THAT IS NOT CONTROLLED WITH YOUR NAUSEA MEDICATION  *UNUSUAL SHORTNESS OF BREATH  *UNUSUAL BRUISING OR BLEEDING  TENDERNESS IN MOUTH AND THROAT WITH OR WITHOUT PRESENCE OF ULCERS  *URINARY PROBLEMS  *BOWEL PROBLEMS  UNUSUAL RASH Items with * indicate a potential emergency and should be followed up as soon as possible.  Feel free to call the clinic should you have any questions or concerns. The clinic phone number is [phone].  Please show the [facility] at check-in to the Emergency Department and triage nurse.  Etoposide, VP-16 injection What is this medicine? ETOPOSIDE, VP-16 (e toe POE side) is a chemotherapy drug. It is used to treat testicular cancer, lung cancer, and other cancers. This medicine may be used for other purposes; ask your health care provider or pharmacist if you have questions. COMMON BRAND NAME(S): Etopophos, Toposar, VePesid What should I tell my health care provider before I take this medicine? They need to know if you have any of these conditions:  infection  kidney disease  liver disease  low blood counts, like low white cell, platelet, or red cell counts  an unusual or allergic reaction to etoposide, other medicines, foods, dyes, or preservatives  pregnant or trying to get pregnant  breast-feeding How should I use this medicine? This medicine is for infusion into a vein. It is administered in a hospital or  clinic by a specially trained health care professional. Talk to your pediatrician regarding the use of this medicine in children. Special care may be needed. Overdosage: If you think you have taken too much of this medicine contact a poison control center or emergency room at once. NOTE: This medicine is only for you. Do not share this medicine with others. What if I miss a dose? It is important not to miss your dose. Call your doctor or health care professional if you are unable to keep an appointment. What may interact with this medicine? This medicine may interact with the following medications:  warfarin This list may not describe all possible interactions. Give your health care provider a list of all the medicines, herbs, non-prescription drugs, or dietary supplements you use. Also tell them if you smoke, drink alcohol, or use illegal drugs. Some items may interact with your medicine. What should I watch for while using this medicine? Visit your doctor for checks on your progress. This drug may make you feel generally unwell. This is not uncommon, as chemotherapy can affect healthy cells as well as cancer cells. Report any side effects. Continue your course of treatment even though you feel ill unless your doctor tells you to stop. In some cases, you may be given additional medicines to help with side effects. Follow all directions for their use. Call your doctor or health care professional for advice if you get a fever, chills or sore throat, or other symptoms of a cold or flu. Do not treat yourself. This drug decreases your body's ability to fight infections. Try to avoid being around  people who are sick. This medicine may increase your risk to bruise or bleed. Call your doctor or health care professional if you notice any unusual bleeding. Talk to your doctor about your risk of cancer. You may be more at risk for certain types of cancers if you take this medicine. Do not become pregnant  while taking this medicine or for at least 6 months after stopping it. Women should inform their doctor if they wish to become pregnant or think they might be pregnant. Women of child-bearing potential will need to have a negative pregnancy test before starting this medicine. There is a potential for serious side effects to an unborn child. Talk to your health care professional or pharmacist for more information. Do not breast-feed an infant while taking this medicine. Men must use a latex condom during sexual contact with a woman while taking this medicine and for at least 4 months after stopping it. A latex condom is needed even if you have had a vasectomy. Contact your doctor right away if your partner becomes pregnant. Do not donate sperm while taking this medicine and for at least 4 months after you stop taking this medicine. Men should inform their doctors if they wish to father a child. This medicine may lower sperm counts. What side effects may I notice from receiving this medicine? Side effects that you should report to your doctor or health care professional as soon as possible:  allergic reactions like skin rash, itching or hives, swelling of the face, lips, or tongue  low blood counts - this medicine may decrease the number of white blood cells, red blood cells, and platelets. You may be at increased risk for infections and bleeding  nausea, vomiting  redness, blistering, peeling or loosening of the skin, including inside the mouth  signs and symptoms of infection like fever; chills; cough; sore throat; pain or trouble passing urine  signs and symptoms of low red blood cells or anemia such as unusually weak or tired; feeling faint or lightheaded; falls; breathing problems  unusual bruising or bleeding Side effects that usually do not require medical attention (report to your doctor or health care professional if they continue or are bothersome):  changes in taste  diarrhea  hair  loss  loss of appetite  mouth sores This list may not describe all possible side effects. Call your doctor for medical advice about side effects. You may report side effects to FDA at 1-800-FDA-1088. Where should I keep my medicine? This drug is given in a hospital or clinic and will not be stored at home. NOTE: This sheet is a summary. It may not cover all possible information. If you have questions about this medicine, talk to your doctor, pharmacist, or health care provider.  2020 Elsevier/Gold Standard (2018-09-14 16:57:15)  Carboplatin injection What is this medicine? CARBOPLATIN (KAR boe pla tin) is a chemotherapy drug. It targets fast dividing cells, like cancer cells, and causes these cells to die. This medicine is used to treat ovarian cancer and many other cancers. This medicine may be used for other purposes; ask your health care provider or pharmacist if you have questions. COMMON BRAND NAME(S): Paraplatin What should I tell my health care provider before I take this medicine? They need to know if you have any of these conditions:  blood disorders  hearing problems  kidney disease  recent or ongoing radiation therapy  an unusual or allergic reaction to carboplatin, cisplatin, other chemotherapy, other medicines, foods, dyes, or preservatives  pregnant or trying to get pregnant  breast-feeding How should I use this medicine? This drug is usually given as an infusion into a vein. It is administered in a hospital or clinic by a specially trained health care professional. Talk to your pediatrician regarding the use of this medicine in children. Special care may be needed. Overdosage: If you think you have taken too much of this medicine contact a poison control center or emergency room at once. NOTE: This medicine is only for you. Do not share this medicine with others. What if I miss a dose? It is important not to miss a dose. Call your doctor or health care  professional if you are unable to keep an appointment. What may interact with this medicine?  medicines for seizures  medicines to increase blood counts like filgrastim, pegfilgrastim, sargramostim  some antibiotics like amikacin, gentamicin, neomycin, streptomycin, tobramycin  vaccines Talk to your doctor or health care professional before taking any of these medicines:  acetaminophen  aspirin  ibuprofen  ketoprofen  naproxen This list may not describe all possible interactions. Give your health care provider a list of all the medicines, herbs, non-prescription drugs, or dietary supplements you use. Also tell them if you smoke, drink alcohol, or use illegal drugs. Some items may interact with your medicine. What should I watch for while using this medicine? Your condition will be monitored carefully while you are receiving this medicine. You will need important blood work done while you are taking this medicine. This drug may make you feel generally unwell. This is not uncommon, as chemotherapy can affect healthy cells as well as cancer cells. Report any side effects. Continue your course of treatment even though you feel ill unless your doctor tells you to stop. In some cases, you may be given additional medicines to help with side effects. Follow all directions for their use. Call your doctor or health care professional for advice if you get a fever, chills or sore throat, or other symptoms of a cold or flu. Do not treat yourself. This drug decreases your body's ability to fight infections. Try to avoid being around people who are sick. This medicine may increase your risk to bruise or bleed. Call your doctor or health care professional if you notice any unusual bleeding. Be careful brushing and flossing your teeth or using a toothpick because you may get an infection or bleed more easily. If you have any dental work done, tell your dentist you are receiving this medicine. Avoid  taking products that contain aspirin, acetaminophen, ibuprofen, naproxen, or ketoprofen unless instructed by your doctor. These medicines may hide a fever. Do not become pregnant while taking this medicine. Women should inform their doctor if they wish to become pregnant or think they might be pregnant. There is a potential for serious side effects to an unborn child. Talk to your health care professional or pharmacist for more information. Do not breast-feed an infant while taking this medicine. What side effects may I notice from receiving this medicine? Side effects that you should report to your doctor or health care professional as soon as possible:  allergic reactions like skin rash, itching or hives, swelling of the face, lips, or tongue  signs of infection - fever or chills, cough, sore throat, pain or difficulty passing urine  signs of decreased platelets or bleeding - bruising, pinpoint red spots on the skin, black, tarry stools, nosebleeds  signs of decreased red blood cells - unusually weak or tired, fainting  spells, lightheadedness  breathing problems  changes in hearing  changes in vision  chest pain  high blood pressure  low blood counts - This drug may decrease the number of white blood cells, red blood cells and platelets. You may be at increased risk for infections and bleeding.  nausea and vomiting  pain, swelling, redness or irritation at the injection site  pain, tingling, numbness in the hands or feet  problems with balance, talking, walking  trouble passing urine or change in the amount of urine Side effects that usually do not require medical attention (report to your doctor or health care professional if they continue or are bothersome):  hair loss  loss of appetite  metallic taste in the mouth or changes in taste This list may not describe all possible side effects. Call your doctor for medical advice about side effects. You may report side effects  to FDA at 1-800-FDA-1088. Where should I keep my medicine? This drug is given in a hospital or clinic and will not be stored at home. NOTE: This sheet is a summary. It may not cover all possible information. If you have questions about this medicine, talk to your doctor, pharmacist, or health care provider.  2020 Elsevier/Gold Standard (2007-10-25 14:38:05)

## 2020-04-09 NOTE — Progress Notes (Signed)
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  Pt has 2 insurances so copay assistance shouldn't be needed.  I informed him of the [employer], went over what it covers and gave him the income requirement.  He stated he exceeds that amount so he doesn't qualify for the grant at this time.  I gave him my card for any questions or concerns he may have in the future.

## 2020-04-10 ENCOUNTER — Inpatient Hospital Stay: Payer: Medicare Other

## 2020-04-10 ENCOUNTER — Other Ambulatory Visit: Payer: Self-pay

## 2020-04-10 VITALS — BP 138/75 | HR 81 | Temp 98.0°F | Resp 17

## 2020-04-10 DIAGNOSIS — R5383 Other fatigue: Secondary | ICD-10-CM | POA: Diagnosis not present

## 2020-04-10 DIAGNOSIS — J45909 Unspecified asthma, uncomplicated: Secondary | ICD-10-CM | POA: Diagnosis not present

## 2020-04-10 DIAGNOSIS — Z5111 Encounter for antineoplastic chemotherapy: Secondary | ICD-10-CM | POA: Diagnosis not present

## 2020-04-10 DIAGNOSIS — C3431 Malignant neoplasm of lower lobe, right bronchus or lung: Secondary | ICD-10-CM | POA: Diagnosis not present

## 2020-04-10 DIAGNOSIS — K219 Gastro-esophageal reflux disease without esophagitis: Secondary | ICD-10-CM | POA: Diagnosis not present

## 2020-04-10 MED ORDER — SODIUM CHLORIDE 0.9 % IV SOLN
Freq: Once | INTRAVENOUS | Status: AC
  Filled 2020-04-10: qty 250

## 2020-04-10 MED ORDER — SODIUM CHLORIDE 0.9 % IV SOLN
10.0000 mg | Freq: Once | INTRAVENOUS | Status: AC
  Administered 2020-04-10: 10 mg via INTRAVENOUS
  Filled 2020-04-10: qty 10

## 2020-04-10 MED ORDER — SODIUM CHLORIDE 0.9 % IV SOLN
100.0000 mg/m2 | Freq: Once | INTRAVENOUS | Status: AC
  Administered 2020-04-10: 190 mg via INTRAVENOUS
  Filled 2020-04-10: qty 9.5

## 2020-04-10 NOTE — Patient Instructions (Signed)
[facility] Discharge Instructions for Patients Receiving Chemotherapy  Today you received the following chemotherapy agents: etoposide  To help prevent nausea and vomiting after your treatment, we encourage you to take your nausea medication as directed.   If you develop nausea and vomiting that is not controlled by your nausea medication, call the clinic.   BELOW ARE SYMPTOMS THAT SHOULD BE REPORTED IMMEDIATELY:  *FEVER GREATER THAN 100.5 F  *CHILLS WITH OR WITHOUT FEVER  NAUSEA AND VOMITING THAT IS NOT CONTROLLED WITH YOUR NAUSEA MEDICATION  *UNUSUAL SHORTNESS OF BREATH  *UNUSUAL BRUISING OR BLEEDING  TENDERNESS IN MOUTH AND THROAT WITH OR WITHOUT PRESENCE OF ULCERS  *URINARY PROBLEMS  *BOWEL PROBLEMS  UNUSUAL RASH Items with * indicate a potential emergency and should be followed up as soon as possible.  Feel free to call the clinic should you have any questions or concerns. The clinic phone number is [phone].  Please show the CHEMO ALERT CARD at check-in to the Emergency Department and triage nurse.   

## 2020-04-11 ENCOUNTER — Other Ambulatory Visit: Payer: Self-pay

## 2020-04-11 ENCOUNTER — Inpatient Hospital Stay: Payer: Medicare Other

## 2020-04-11 VITALS — BP 159/85 | HR 79 | Temp 97.7°F | Resp 18

## 2020-04-11 DIAGNOSIS — J45909 Unspecified asthma, uncomplicated: Secondary | ICD-10-CM | POA: Diagnosis not present

## 2020-04-11 DIAGNOSIS — R5383 Other fatigue: Secondary | ICD-10-CM | POA: Diagnosis not present

## 2020-04-11 DIAGNOSIS — C3431 Malignant neoplasm of lower lobe, right bronchus or lung: Secondary | ICD-10-CM | POA: Diagnosis not present

## 2020-04-11 DIAGNOSIS — Z5111 Encounter for antineoplastic chemotherapy: Secondary | ICD-10-CM | POA: Diagnosis not present

## 2020-04-11 DIAGNOSIS — K219 Gastro-esophageal reflux disease without esophagitis: Secondary | ICD-10-CM | POA: Diagnosis not present

## 2020-04-11 MED ORDER — SODIUM CHLORIDE 0.9 % IV SOLN
10.0000 mg | Freq: Once | INTRAVENOUS | Status: AC
  Administered 2020-04-11: 10 mg via INTRAVENOUS
  Filled 2020-04-11: qty 10

## 2020-04-11 MED ORDER — SODIUM CHLORIDE 0.9 % IV SOLN
Freq: Once | INTRAVENOUS | Status: AC
  Filled 2020-04-11: qty 250

## 2020-04-11 MED ORDER — SODIUM CHLORIDE 0.9 % IV SOLN
100.0000 mg/m2 | Freq: Once | INTRAVENOUS | Status: AC
  Administered 2020-04-11: 190 mg via INTRAVENOUS
  Filled 2020-04-11: qty 9.5

## 2020-04-11 NOTE — Patient Instructions (Signed)
[facility] Discharge Instructions for Patients Receiving Chemotherapy  Today you received the following chemotherapy agents: etoposide  To help prevent nausea and vomiting after your treatment, we encourage you to take your nausea medication as directed.   If you develop nausea and vomiting that is not controlled by your nausea medication, call the clinic.   BELOW ARE SYMPTOMS THAT SHOULD BE REPORTED IMMEDIATELY:  *FEVER GREATER THAN 100.5 F  *CHILLS WITH OR WITHOUT FEVER  NAUSEA AND VOMITING THAT IS NOT CONTROLLED WITH YOUR NAUSEA MEDICATION  *UNUSUAL SHORTNESS OF BREATH  *UNUSUAL BRUISING OR BLEEDING  TENDERNESS IN MOUTH AND THROAT WITH OR WITHOUT PRESENCE OF ULCERS  *URINARY PROBLEMS  *BOWEL PROBLEMS  UNUSUAL RASH Items with * indicate a potential emergency and should be followed up as soon as possible.  Feel free to call the clinic should you have any questions or concerns. The clinic phone number is [phone].  Please show the CHEMO ALERT CARD at check-in to the Emergency Department and triage nurse.   

## 2020-04-13 ENCOUNTER — Inpatient Hospital Stay: Payer: Medicare Other

## 2020-04-13 ENCOUNTER — Other Ambulatory Visit: Payer: Self-pay

## 2020-04-13 VITALS — BP 156/91 | HR 81 | Temp 97.5°F | Resp 18

## 2020-04-13 DIAGNOSIS — C3431 Malignant neoplasm of lower lobe, right bronchus or lung: Secondary | ICD-10-CM | POA: Diagnosis not present

## 2020-04-13 DIAGNOSIS — R5383 Other fatigue: Secondary | ICD-10-CM | POA: Diagnosis not present

## 2020-04-13 DIAGNOSIS — J45909 Unspecified asthma, uncomplicated: Secondary | ICD-10-CM | POA: Diagnosis not present

## 2020-04-13 DIAGNOSIS — K219 Gastro-esophageal reflux disease without esophagitis: Secondary | ICD-10-CM | POA: Diagnosis not present

## 2020-04-13 DIAGNOSIS — Z5111 Encounter for antineoplastic chemotherapy: Secondary | ICD-10-CM | POA: Diagnosis not present

## 2020-04-13 MED ORDER — PEGFILGRASTIM-JMDB 6 MG/0.6ML ~~LOC~~ SOSY
6.0000 mg | PREFILLED_SYRINGE | Freq: Once | SUBCUTANEOUS | Status: AC
  Administered 2020-04-13: 6 mg via SUBCUTANEOUS

## 2020-04-13 NOTE — Patient Instructions (Signed)

## 2020-04-15 ENCOUNTER — Encounter: Payer: Self-pay | Admitting: Internal Medicine

## 2020-04-15 ENCOUNTER — Other Ambulatory Visit: Payer: Self-pay

## 2020-04-15 ENCOUNTER — Inpatient Hospital Stay (HOSPITAL_BASED_OUTPATIENT_CLINIC_OR_DEPARTMENT_OTHER): Payer: Medicare Other | Admitting: Internal Medicine

## 2020-04-15 ENCOUNTER — Inpatient Hospital Stay: Payer: Medicare Other

## 2020-04-15 ENCOUNTER — Ambulatory Visit (HOSPITAL_COMMUNITY)
Admission: RE | Admit: 2020-04-15 | Discharge: 2020-04-15 | Disposition: A | Discharge status: Home / Self Care | Payer: Medicare Other | Source: Ambulatory Visit | Attending: Internal Medicine | Admitting: Internal Medicine

## 2020-04-15 ENCOUNTER — Encounter: Payer: Self-pay | Admitting: *Deleted

## 2020-04-15 VITALS — BP 129/72 | HR 75 | Temp 97.7°F | Resp 20 | Ht 70.0 in | Wt 163.5 lb

## 2020-04-15 DIAGNOSIS — C3431 Malignant neoplasm of lower lobe, right bronchus or lung: Secondary | ICD-10-CM

## 2020-04-15 DIAGNOSIS — C3490 Malignant neoplasm of unspecified part of unspecified bronchus or lung: Secondary | ICD-10-CM | POA: Diagnosis not present

## 2020-04-15 DIAGNOSIS — I6782 Cerebral ischemia: Secondary | ICD-10-CM | POA: Diagnosis not present

## 2020-04-15 DIAGNOSIS — J45909 Unspecified asthma, uncomplicated: Secondary | ICD-10-CM | POA: Diagnosis not present

## 2020-04-15 DIAGNOSIS — K219 Gastro-esophageal reflux disease without esophagitis: Secondary | ICD-10-CM | POA: Diagnosis not present

## 2020-04-15 DIAGNOSIS — Z5111 Encounter for antineoplastic chemotherapy: Secondary | ICD-10-CM | POA: Diagnosis not present

## 2020-04-15 DIAGNOSIS — R5383 Other fatigue: Secondary | ICD-10-CM | POA: Diagnosis not present

## 2020-04-15 LAB — CMP (CANCER CENTER ONLY)
ALT: 39 U/L (ref 0–44)
AST: 23 U/L (ref 15–41)
Albumin: 3.6 g/dL (ref 3.5–5.0)
Alkaline Phosphatase: 170 U/L — ABNORMAL HIGH (ref 38–126)
Anion gap: 6 (ref 5–15)
BUN: 16 mg/dL (ref 8–23)
CO2: 29 mmol/L (ref 22–32)
Calcium: 10.3 mg/dL (ref 8.9–10.3)
Chloride: 103 mmol/L (ref 98–111)
Creatinine: 0.85 mg/dL (ref 0.61–1.24)
GFR, Est AFR Am: >60 mL/min (ref >60)
GFR, Estimated: >60 mL/min (ref >60)
Glucose, Bld: 104 mg/dL — ABNORMAL HIGH (ref 70–99)
Potassium: 4.2 mmol/L (ref 3.5–5.1)
Sodium: 138 mmol/L (ref 135–145)
Total Bilirubin: 2 mg/dL — ABNORMAL HIGH (ref 0.3–1.2)
Total Protein: 6.4 g/dL — ABNORMAL LOW (ref 6.5–8.1)

## 2020-04-15 LAB — CBC WITH DIFFERENTIAL (CANCER CENTER ONLY)
Abs Immature Granulocytes: 0.1 10*3/uL — ABNORMAL HIGH (ref 0.00–0.07)
Band Neutrophils: 3 %
Basophils Absolute: 0 10*3/uL (ref 0.0–0.1)
Basophils Relative: 0 %
Eosinophils Absolute: 0.1 10*3/uL (ref 0.0–0.5)
Eosinophils Relative: 1 %
HCT: 34.9 % — ABNORMAL LOW (ref 39.0–52.0)
Hemoglobin: 11.7 g/dL — ABNORMAL LOW (ref 13.0–17.0)
Lymphocytes Relative: 13 %
Lymphs Abs: 1.3 10*3/uL (ref 0.7–4.0)
MCH: 31 pg (ref 26.0–34.0)
MCHC: 33.5 g/dL (ref 30.0–36.0)
MCV: 92.6 fL (ref 80.0–100.0)
Metamyelocytes Relative: 1 %
Monocytes Absolute: 0.2 10*3/uL (ref 0.1–1.0)
Monocytes Relative: 2 %
Neutro Abs: 8.2 10*3/uL — ABNORMAL HIGH (ref 1.7–7.7)
Neutrophils Relative %: 80 %
Platelet Count: 67 10*3/uL — ABNORMAL LOW (ref 150–400)
RBC: 3.77 MIL/uL — ABNORMAL LOW (ref 4.22–5.81)
RDW: 14.2 % (ref 11.5–15.5)
WBC Count: 9.9 10*3/uL (ref 4.0–10.5)
nRBC: 0 % (ref 0.0–0.2)

## 2020-04-15 IMAGING — MR MR HEAD WO/W CM
14 of 16 series · 42 of 48 positions shown · IV contrast (Gadavist)
Comparison: CT [DATE]

CLINICAL DATA: Small cell lung cancer.  Staging.

EXAM:
MRI HEAD WITHOUT AND WITH CONTRAST
TECHNIQUE: Multiplanar, multiecho pulse sequences of the brain and surrounding
structures were obtained without and with intravenous contrast.
CONTRAST:  7.4mL GADAVIST GADOBUTROL 1 MMOL/ML IV SOLN

[Series 5: DWI · axial · 3.0mm · 0.88mm/px · z∈[-87,+59]mm · 7 of 100 slices shown (1 of 4)]
[im 1/100]
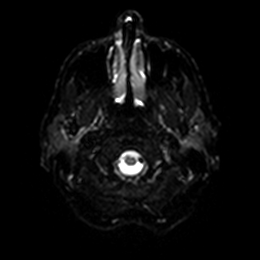
[im 17/100]
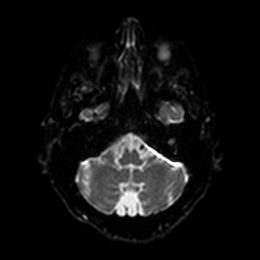
[im 34/100]
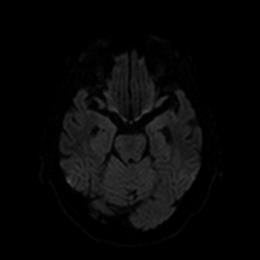
[im 50/100]
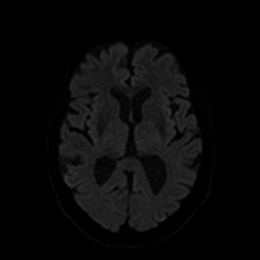
[im 67/100]
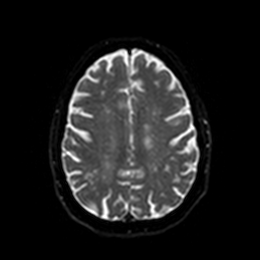
[im 83/100]
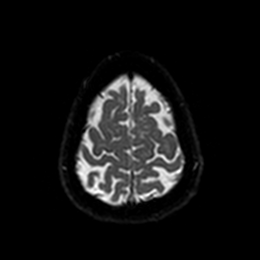
[im 100/100]
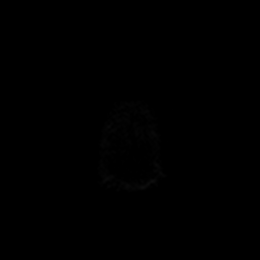

[Series 6: DWI · axial · 3.0mm · 0.88mm/px · z∈[-87,+59]mm · 3 of 50 slices shown (2 of 4)]
[im 1/50]
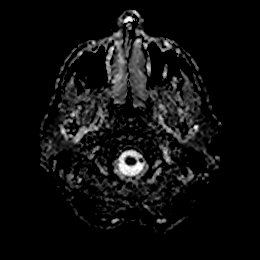
[im 25/50]
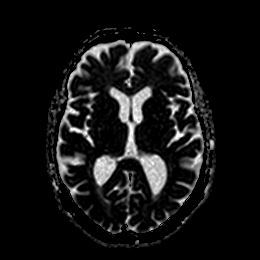
[im 50/50]
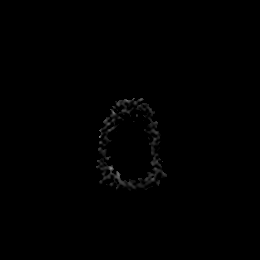

[Series 7: DWI · coronal · 4.0mm · 0.88mm/px · 5 of 76 slices shown (3 of 4)]
[im 1/76]
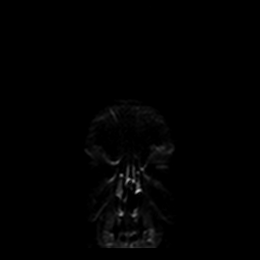
[im 19/76]
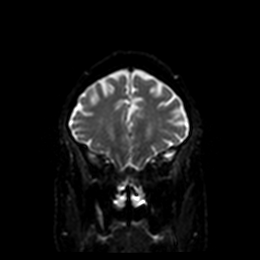
[im 38/76]
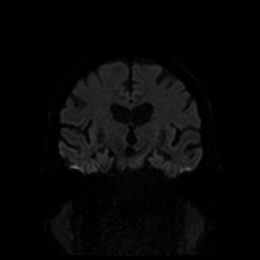
[im 57/76]
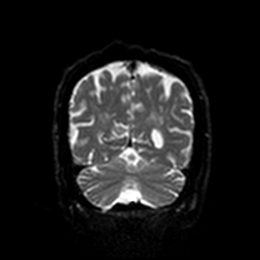
[im 76/76]
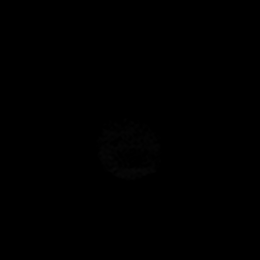

[Series 8: DWI · coronal · 4.0mm · 0.88mm/px · 3 of 38 slices shown (4 of 4)]
[im 1/38]
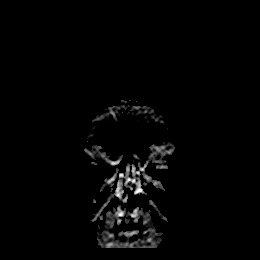
[im 19/38]
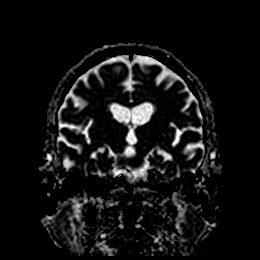
[im 38/38]
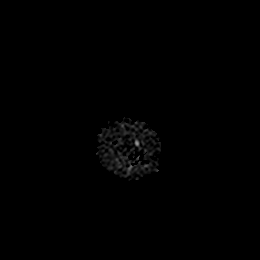

[Series 9: T1 · sagittal · 5.0mm · 0.75mm/px · 2 of 23 slices shown]
[im 1/23]
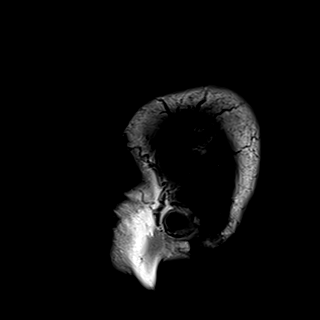
[im 23/23]
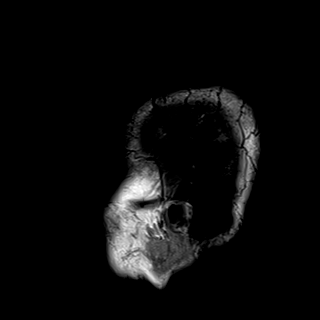

[Series 10: T2 · axial · 5.0mm · 0.72mm/px · z∈[-94,+61]mm · 2 of 27 slices shown]
[im 1/27]
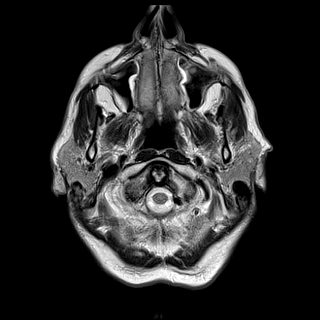
[im 27/27]
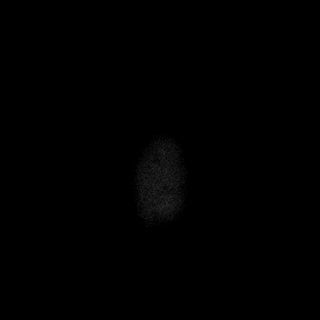

[Series 11: FLAIR · axial · 5.0mm · 0.45mm/px · z∈[-94,+60]mm · 2 of 27 slices shown]
[im 1/27]
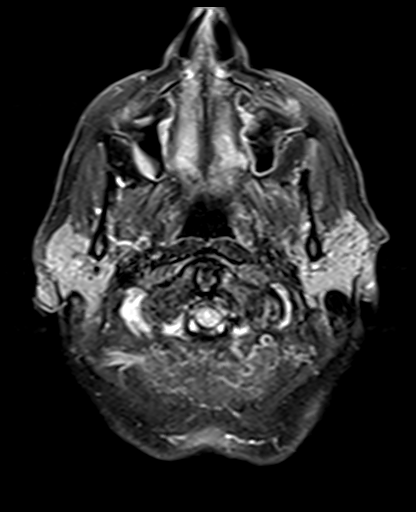
[im 27/27]
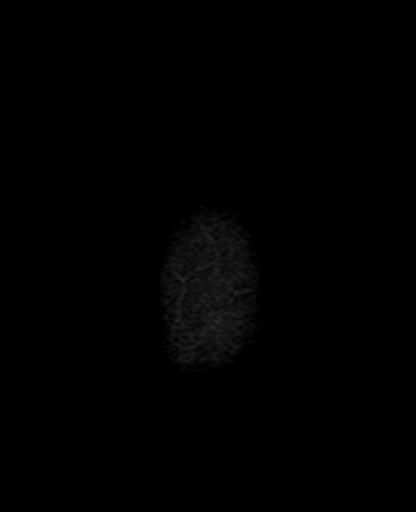

[Series 12: mag_images · axial · 3.0mm · 0.90mm/px · z∈[-84,+56]mm · 3 of 48 slices shown]
[im 1/48]
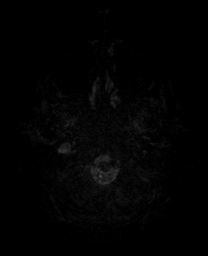
[im 24/48]
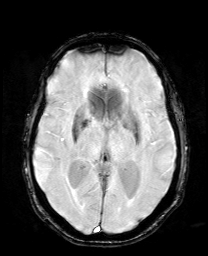
[im 48/48]
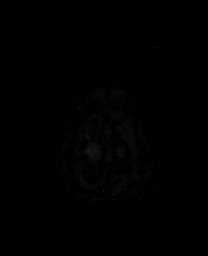

[Series 13: pha_images · axial · 3.0mm · 0.90mm/px · z∈[-84,+56]mm · 3 of 48 slices shown]
[im 1/48]
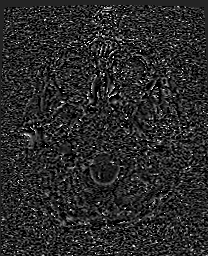
[im 24/48]
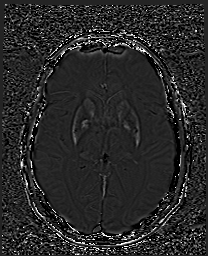
[im 48/48]
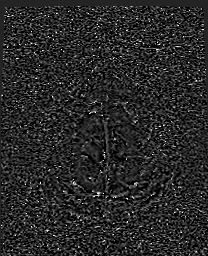

[Series 14: swi_images · axial · 3.0mm · 0.90mm/px · z∈[-84,+56]mm · 3 of 48 slices shown]
[im 1/48]
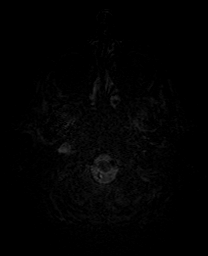
[im 24/48]
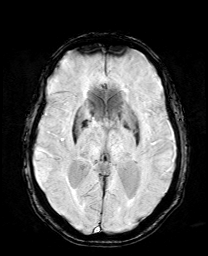
[im 48/48]
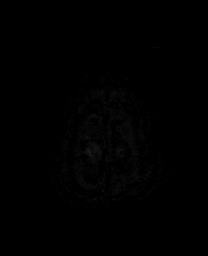

[Series 15: mip_images(sw) · axial · 24.0mm · 0.90mm/px · z∈[-74,+46]mm · 3 of 41 slices shown]
[im 1/41]
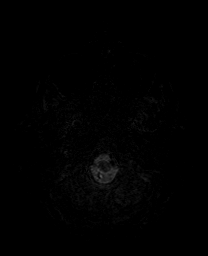
[im 21/41]
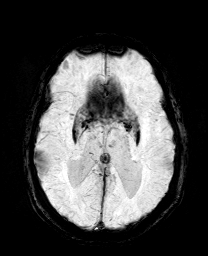
[im 41/41]
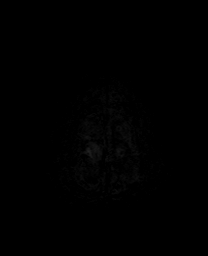

[Series 17: T2 post-contrast · coronal · 5.0mm · 0.72mm/px · 2 of 30 slices shown]
[im 1/30]
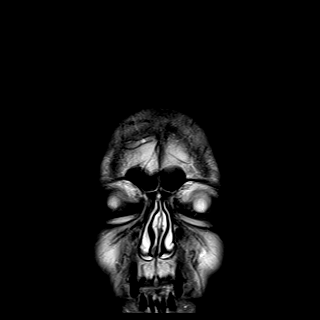
[im 30/30]
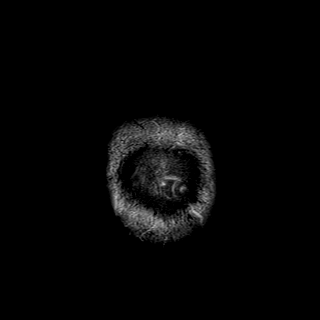

[Series 19: T1 post-contrast · coronal · 5.0mm · 0.34mm/px · 2 of 30 slices shown (1 of 2)]
[im 1/30]
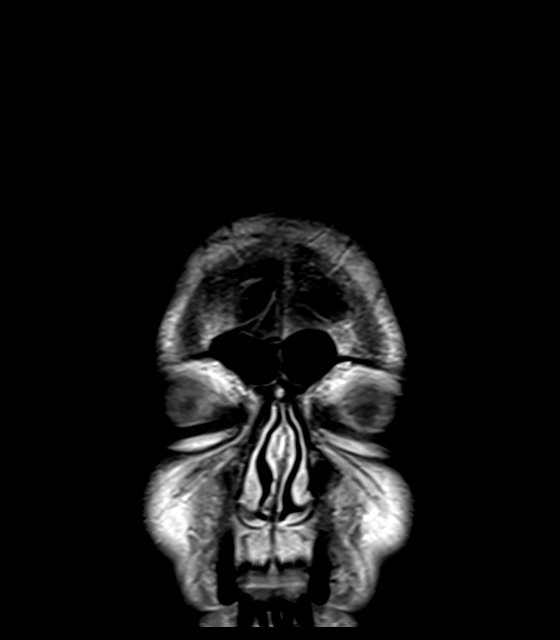
[im 30/30]
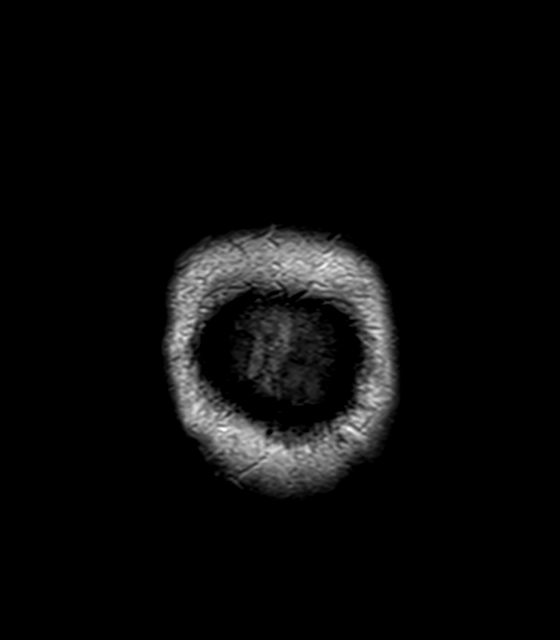

[Series 20: T1 post-contrast · sagittal · 5.0mm · 0.75mm/px · 2 of 23 slices shown (2 of 2)]
[im 1/23]
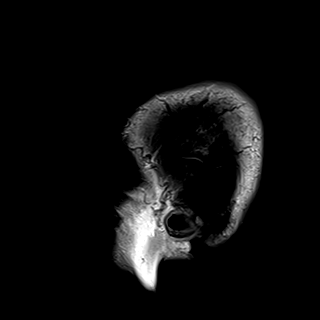
[im 23/23]
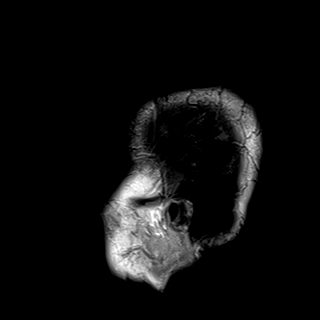

[42 of 48 positions shown; findings below may reference images not displayed]

FINDINGS: Brain: Diffusion imaging does not show any acute or subacute
infarction. No focal finding affects the brainstem or cerebellum.
Cerebral hemispheres show mild to moderate chronic small-vessel
ischemic change of the white matter, often seen at this age. No
cortical or large vessel territory infarction. No abnormal
enhancement. No sign of metastatic disease. No hemorrhage,
hydrocephalus or extra-axial collection.

Vascular: Major vessels at the base of the brain show flow.

Skull and upper cervical spine: Negative

Sinuses/Orbits: Clear/normal

Other: None
IMPRESSION: 1. No evidence of metastatic disease.
2. Mild to moderate chronic small-vessel ischemic change of the
cerebral hemispheric white matter, often seen at this age.

## 2020-04-15 MED ORDER — GADOBUTROL 1 MMOL/ML IV SOLN
7.4000 mL | Freq: Once | INTRAVENOUS | Status: AC | PRN
  Administered 2020-04-15: 7.4 mL via INTRAVENOUS

## 2020-04-15 NOTE — Progress Notes (Signed)
[facility] Telephone:(336) [phone]   [phone]  OFFICE PROGRESS NOTE  [name], MD [place] [address]  DIAGNOSIS: Stage IB (T2 a, N0, M0) small cell lung cancer presented with superior segment right lower lobe lung nodule.  PRIOR THERAPY: status post right lower lobe superior segmentectomy with lymph node sampling on March 12, 2020 under the care of Dr. Servando Snare.  CURRENT THERAPY: Systemic chemotherapy with carboplatin for AUC of 5 on day 1 and etoposide 100 mg/M2 on days 1, 2 and 3 with Neulasta support every 3 weeks.  Status post 1 cycle.  INTERVAL HISTORY: Alexander Murray 79 y.o. male returns to the clinic today for follow-up visit.  The patient is feeling fine today with no concerning complaints except for fatigue.  He tolerated the first week of his treatment fairly well.  He denied having any chest pain, shortness of breath, cough or hemoptysis.  He denied having any fever or chills.  He has no nausea, vomiting, diarrhea or constipation.  He has no headache or visual changes.  He is here today for evaluation and repeat blood work.  MEDICAL HISTORY: Past Medical History:  Diagnosis Date  . Allergy   . Arthritis    "right knee and right thumb" (12/19/2014)  . Asthma    pt denies  . Bronchitis 07/2016   started in December and continued about 2 months  . Esophageal stricture   . GERD (gastroesophageal reflux disease)   . Hemochromatosis   . Hypercalcemia   . Hypercholesteremia   . Hypertension   . Osteoarthritis   . Palpitations   . Presence of permanent cardiac pacemaker    hx bradycardia  . Retinal artery occlusion, branch    "right eye"  . Second degree AV block, Mobitz type II   . Skin cancer    right shoulder  . Stroke [facility]) ~ 2011   "right eye"/ partial blindness    ALLERGIES:  has No Known Allergies.  MEDICATIONS:  Current Outpatient Medications  Medication Sig Dispense Refill  . acetaminophen  (TYLENOL) 325 MG tablet Take 2 tablets (650 mg total) by mouth every 6 (six) hours as needed for mild pain or moderate pain.    Marland Kitchen amLODipine (NORVASC) 2.5 MG tablet Take 2.5 mg by mouth at bedtime.    Marland Kitchen aspirin 81 MG tablet Take 81 mg by mouth daily.    . celecoxib (CELEBREX) 200 MG capsule Take 1 capsule (200 mg total) by mouth 2 (two) times daily as needed for mild pain. 10 capsule 0  . chlorthalidone (HYGROTON) 25 MG tablet Take 25 mg by mouth every other day.     . clopidogrel (PLAVIX) 75 MG tablet Take 75 mg by mouth every other day.    . doxazosin (CARDURA) 4 MG tablet Take 4 mg by mouth daily.     Marland Kitchen esomeprazole (NEXIUM) 20 MG packet Take 20 mg by mouth daily before breakfast.    . Misc Natural Products (GLUCOSAMINE CHONDROITIN ADV PO) Take 1,500 mg by mouth daily.    . montelukast (SINGULAIR) 10 MG tablet Take 10 mg by mouth every morning.     . Multiple Vitamin (MULTIVITAMIN) capsule Take 1 capsule by mouth daily.    . Multiple Vitamins-Minerals (PRESERVISION AREDS 2 PO) Take 1 capsule by mouth daily.    . prochlorperazine (COMPAZINE) 10 MG tablet Take 1 tablet (10 mg total) by mouth every 6 (six) hours as needed for nausea or vomiting. [place]  tablet 0  . simvastatin (ZOCOR) 40 MG tablet Take 40 mg by mouth daily.    . vitamin C (ASCORBIC ACID) 500 MG tablet Take 500 mg by mouth daily.      No current facility-administered medications for this visit.    SURGICAL HISTORY:  Past Surgical History:  Procedure Laterality Date  . CATARACT EXTRACTION, BILATERAL  01/2017  . COLONOSCOPY    . EP IMPLANTABLE DEVICE N/A 12/19/2014   Procedure: Pacemaker Implant;  Surgeon: Deboraha Sprang, MD;  Location: [facility];  Service: Cardiovascular;  Laterality: N/A;  . ESOPHAGOGASTRODUODENOSCOPY (EGD) WITH ESOPHAGEAL DILATION  X 3  . EYE SURGERY Bilateral 2017  . INGUINAL HERNIA REPAIR Left 1980's  . INSERT / REPLACE / REMOVE PACEMAKER  12/19/2014  . INTERCOSTAL NERVE BLOCK Right 03/11/2020    Procedure: INTERCOSTAL NERVE BLOCK;  Surgeon: Grace Isaac, MD;  Location: [facility];  Service: Thoracic;  Laterality: Right;  . JOINT REPLACEMENT Right 2016  . KNEE ARTHROSCOPY Right ~ 1982; ~ 1992  . MOHS SURGERY Right ~ 2014   "pre-melanoma scapula"  . POSTERIOR TIBIAL TENDON REPAIR Left 2012  . TONSILLECTOMY  1946  . TOTAL KNEE ARTHROPLASTY Right 05/20/2015   Procedure: RIGHT TOTAL KNEE ARTHROPLASTY;  Surgeon: Paralee Cancel, MD;  Location: [facility];  Service: Orthopedics;  Laterality: Right;  . UPPER GASTROINTESTINAL ENDOSCOPY    . VIDEO BRONCHOSCOPY N/A 03/11/2020   Procedure: VIDEO BRONCHOSCOPY;  Surgeon: Grace Isaac, MD;  Location: [facility];  Service: Thoracic;  Laterality: N/A;  . XI ROBOTIC ASSISTED THORACOSCOPY- SEGMENTECTOMY Right 03/11/2020   Procedure: XI ROBOTIC ASSISTED THORACOSCOPY-RIGHT SUPERIOR SEGMENTECTOMY WITH NODE SAMPLES;  Surgeon: Grace Isaac, MD;  Location: [facility];  Service: Thoracic;  Laterality: Right;    REVIEW OF SYSTEMS:  A comprehensive review of systems was negative except for: Constitutional: positive for fatigue   PHYSICAL EXAMINATION: General appearance: alert, cooperative, fatigued and no distress Head: Normocephalic, without obvious abnormality, atraumatic Neck: no adenopathy, no JVD, supple, symmetrical, trachea midline and thyroid not enlarged, symmetric, no tenderness/mass/nodules Lymph nodes: Cervical, supraclavicular, and axillary nodes normal. Resp: clear to auscultation bilaterally Back: symmetric, no curvature. ROM normal. No CVA tenderness. Cardio: regular rate and rhythm, S1, S2 normal, no murmur, click, rub or gallop GI: soft, non-tender; bowel sounds normal; no masses,  no organomegaly Extremities: extremities normal, atraumatic, no cyanosis or edema  ECOG PERFORMANCE STATUS: 1 - Symptomatic but completely ambulatory  Blood pressure 129/72, pulse 75, temperature 97.7 F (36.5 C), temperature source Tympanic, resp. rate 20, height 5'  10" (1.778 m), weight 163 lb 8 oz (74.2 kg), SpO2 100 %.  LABORATORY DATA: Lab Results  Component Value Date   WBC 9.9 04/15/2020   HGB 11.7 (L) 04/15/2020   HCT 34.9 (L) 04/15/2020   MCV 92.6 04/15/2020   PLT PENDING 04/15/2020      Chemistry      Component Value Date/Time   NA 137 04/09/2020 0816   K 3.7 04/09/2020 0816   CL 107 04/09/2020 0816   CO2 25 04/09/2020 0816   BUN 16 04/09/2020 0816   CREATININE 0.93 04/09/2020 0816      Component Value Date/Time   CALCIUM 10.4 (H) 04/09/2020 0816   ALKPHOS 160 (H) 04/09/2020 0816   AST 26 04/09/2020 0816   ALT 22 04/09/2020 0816   BILITOT 1.1 04/09/2020 0816       RADIOGRAPHIC STUDIES: DG Chest 2 View  Result Date: 03/28/2020 CLINICAL DATA:  Reported recent  robotic assisted thoracoscopy for lung carcinoma EXAM: CHEST - 2 VIEW COMPARISON:  March 13, 2020 chest radiograph; PET-CT February 09, 2020. Thoracic CT March 12, 2017 FINDINGS: Mild volume loss on the right. The previously noted mass in the superior segment right lower lobe no longer appreciable. There is no edema or airspace opacity. Heart is upper normal in size with pulmonary vascularity normal. Note calcification along much of the right heart border, a finding indicative of prior pericarditis. Prior myocardial infarction could contribute to calcification of this nature as well. Pacemaker leads are attached to the right atrium and right ventricle. There is aortic atherosclerosis. No adenopathy evident. Severe collapse of the T11 vertebral body is stable as is milder wedging at T7. IMPRESSION: Postoperative change on the right with mild volume loss but no edema or consolidation. No pneumothorax. No residual pulmonary nodular lesion evident by radiography. Stable cardiac silhouette. Extensive calcification along the wall the heart likely is due to previous pericarditis. Pacemaker leads attached to right atrium and right ventricle. Stable compression fractures in the lower thoracic  spine noted. Aortic Atherosclerosis (ICD10-I70.0). Electronically Signed   By: Lowella Grip III M.D.   On: 03/28/2020 10:42    ASSESSMENT AND PLAN: This is a very pleasant 79 years old white male diagnosed with a stage Ib (T2 a, N0, M0) small cell lung cancer presented with superior segment right lower lobe nodule status post right lower lobe superior segmentectomy with lymph node dissection in August 2021 under the care of Dr. Servando Snare. The patient is currently undergoing adjuvant treatment with systemic chemotherapy with carboplatin for AUC of 5 on day 1 and etoposide 100 mg/M2 on days 1, 2 and 3 with Neulasta support.  He status post 1 cycle started last week. The patient tolerated the first cycle of his treatment well except for fatigue. I recommended for him to continue with his treatment as planned and he is expected to start cycle #2 in 2 weeks. The patient will come back for follow-up visit at that time.   He was also advised to hold his Plavix for few days if his platelets count are less than 20,000 or if he has any significant bleeding issues. He was advised to call immediately if he has any concerning symptoms in the interval. The patient voices understanding of current disease status and treatment options and is in agreement with the current care plan.  All questions were answered. The patient knows to call the clinic with any problems, questions or concerns. We can certainly see the patient much sooner if necessary.  Disclaimer: This note was dictated with voice recognition software. Similar sounding words can inadvertently be transcribed and may not be corrected upon review.

## 2020-04-15 NOTE — Progress Notes (Signed)
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  He is doing well with some complaints of fatigue.  Dr. Julien Nordmann is aware of fatigue and state this is expected with treatment.  He is set up for his next infusion and labs.  He is getting his MRI today and is aware of this appt.

## 2020-04-15 NOTE — Progress Notes (Signed)
MRI completed. IV removed. Original settings re-programmed. Carelink express sent. Patient ambulatory at discharge.

## 2020-04-15 NOTE — Progress Notes (Signed)
Patient presents to [facility] for MRI. carelink express sent to mike-[employer] rep and renee-cardiology PA. Orders received for DOO rate of 85. Will re-program post MRI.

## 2020-04-17 ENCOUNTER — Telehealth: Payer: Self-pay | Admitting: Internal Medicine

## 2020-04-17 ENCOUNTER — Encounter: Payer: Self-pay | Admitting: *Deleted

## 2020-04-17 NOTE — Telephone Encounter (Signed)
Scheduled per los. Called, not able to leave msg. Mailed printout  

## 2020-04-22 ENCOUNTER — Inpatient Hospital Stay: Payer: Medicare Other

## 2020-04-22 ENCOUNTER — Other Ambulatory Visit: Payer: Self-pay

## 2020-04-22 DIAGNOSIS — C3431 Malignant neoplasm of lower lobe, right bronchus or lung: Secondary | ICD-10-CM | POA: Diagnosis not present

## 2020-04-22 DIAGNOSIS — R5383 Other fatigue: Secondary | ICD-10-CM | POA: Diagnosis not present

## 2020-04-22 DIAGNOSIS — Z5111 Encounter for antineoplastic chemotherapy: Secondary | ICD-10-CM | POA: Diagnosis not present

## 2020-04-22 DIAGNOSIS — J45909 Unspecified asthma, uncomplicated: Secondary | ICD-10-CM | POA: Diagnosis not present

## 2020-04-22 DIAGNOSIS — K219 Gastro-esophageal reflux disease without esophagitis: Secondary | ICD-10-CM | POA: Diagnosis not present

## 2020-04-22 LAB — CMP (CANCER CENTER ONLY)
ALT: 32 U/L (ref 0–44)
AST: 30 U/L (ref 15–41)
Albumin: 3.5 g/dL (ref 3.5–5.0)
Alkaline Phosphatase: 254 U/L — ABNORMAL HIGH (ref 38–126)
Anion gap: 7 (ref 5–15)
BUN: 11 mg/dL (ref 8–23)
CO2: 25 mmol/L (ref 22–32)
Calcium: 10 mg/dL (ref 8.9–10.3)
Chloride: 105 mmol/L (ref 98–111)
Creatinine: 0.99 mg/dL (ref 0.61–1.24)
GFR, Est AFR Am: >60 mL/min (ref >60)
GFR, Estimated: >60 mL/min (ref >60)
Glucose, Bld: 140 mg/dL — ABNORMAL HIGH (ref 70–99)
Potassium: 4.1 mmol/L (ref 3.5–5.1)
Sodium: 137 mmol/L (ref 135–145)
Total Bilirubin: 0.8 mg/dL (ref 0.3–1.2)
Total Protein: 6.3 g/dL — ABNORMAL LOW (ref 6.5–8.1)

## 2020-04-22 LAB — CBC WITH DIFFERENTIAL (CANCER CENTER ONLY)
Abs Immature Granulocytes: 2.35 10*3/uL — ABNORMAL HIGH (ref 0.00–0.07)
Basophils Absolute: 0 10*3/uL (ref 0.0–0.1)
Basophils Relative: 0 %
Eosinophils Absolute: 0.1 10*3/uL (ref 0.0–0.5)
Eosinophils Relative: 0 %
HCT: 32.6 % — ABNORMAL LOW (ref 39.0–52.0)
Hemoglobin: 11.1 g/dL — ABNORMAL LOW (ref 13.0–17.0)
Immature Granulocytes: 15 %
Lymphocytes Relative: 10 %
Lymphs Abs: 1.5 10*3/uL (ref 0.7–4.0)
MCH: 30.3 pg (ref 26.0–34.0)
MCHC: 34 g/dL (ref 30.0–36.0)
MCV: 89.1 fL (ref 80.0–100.0)
Monocytes Absolute: 1.7 10*3/uL — ABNORMAL HIGH (ref 0.1–1.0)
Monocytes Relative: 11 %
Neutro Abs: 9.9 10*3/uL — ABNORMAL HIGH (ref 1.7–7.7)
Neutrophils Relative %: 64 %
Platelet Count: 60 10*3/uL — ABNORMAL LOW (ref 150–400)
RBC: 3.66 MIL/uL — ABNORMAL LOW (ref 4.22–5.81)
RDW: 16.4 % — ABNORMAL HIGH (ref 11.5–15.5)
WBC Count: 15.5 10*3/uL — ABNORMAL HIGH (ref 4.0–10.5)
nRBC: 0.1 % (ref 0.0–0.2)

## 2020-04-23 ENCOUNTER — Telehealth: Payer: Self-pay | Admitting: *Deleted

## 2020-04-29 ENCOUNTER — Encounter: Payer: Self-pay | Admitting: Internal Medicine

## 2020-04-29 ENCOUNTER — Inpatient Hospital Stay (HOSPITAL_BASED_OUTPATIENT_CLINIC_OR_DEPARTMENT_OTHER): Payer: Medicare Other | Admitting: Internal Medicine

## 2020-04-29 ENCOUNTER — Inpatient Hospital Stay: Payer: Medicare Other

## 2020-04-29 ENCOUNTER — Other Ambulatory Visit: Payer: Self-pay

## 2020-04-29 VITALS — BP 134/78 | HR 71 | Temp 97.5°F | Resp 16 | Ht 70.0 in | Wt 161.0 lb

## 2020-04-29 DIAGNOSIS — R5383 Other fatigue: Secondary | ICD-10-CM | POA: Diagnosis not present

## 2020-04-29 DIAGNOSIS — C3431 Malignant neoplasm of lower lobe, right bronchus or lung: Secondary | ICD-10-CM

## 2020-04-29 DIAGNOSIS — K219 Gastro-esophageal reflux disease without esophagitis: Secondary | ICD-10-CM | POA: Diagnosis not present

## 2020-04-29 DIAGNOSIS — Z5111 Encounter for antineoplastic chemotherapy: Secondary | ICD-10-CM | POA: Diagnosis not present

## 2020-04-29 DIAGNOSIS — J45909 Unspecified asthma, uncomplicated: Secondary | ICD-10-CM | POA: Diagnosis not present

## 2020-04-29 LAB — CBC WITH DIFFERENTIAL (CANCER CENTER ONLY)
Abs Immature Granulocytes: 0.22 10*3/uL — ABNORMAL HIGH (ref 0.00–0.07)
Basophils Absolute: 0.1 10*3/uL (ref 0.0–0.1)
Basophils Relative: 1 %
Eosinophils Absolute: 0 10*3/uL (ref 0.0–0.5)
Eosinophils Relative: 0 %
HCT: 36.4 % — ABNORMAL LOW (ref 39.0–52.0)
Hemoglobin: 12.3 g/dL — ABNORMAL LOW (ref 13.0–17.0)
Immature Granulocytes: 2 %
Lymphocytes Relative: 14 %
Lymphs Abs: 1.5 10*3/uL (ref 0.7–4.0)
MCH: 31 pg (ref 26.0–34.0)
MCHC: 33.8 g/dL (ref 30.0–36.0)
MCV: 91.7 fL (ref 80.0–100.0)
Monocytes Absolute: 1.1 10*3/uL — ABNORMAL HIGH (ref 0.1–1.0)
Monocytes Relative: 11 %
Neutro Abs: 7.6 10*3/uL (ref 1.7–7.7)
Neutrophils Relative %: 72 %
Platelet Count: 206 10*3/uL (ref 150–400)
RBC: 3.97 MIL/uL — ABNORMAL LOW (ref 4.22–5.81)
RDW: 17.7 % — ABNORMAL HIGH (ref 11.5–15.5)
WBC Count: 10.5 10*3/uL (ref 4.0–10.5)
nRBC: 0 % (ref 0.0–0.2)

## 2020-04-29 LAB — CMP (CANCER CENTER ONLY)
ALT: 23 U/L (ref 0–44)
AST: 25 U/L (ref 15–41)
Albumin: 3.7 g/dL (ref 3.5–5.0)
Alkaline Phosphatase: 235 U/L — ABNORMAL HIGH (ref 38–126)
Anion gap: 5 (ref 5–15)
BUN: 10 mg/dL (ref 8–23)
CO2: 27 mmol/L (ref 22–32)
Calcium: 10.3 mg/dL (ref 8.9–10.3)
Chloride: 107 mmol/L (ref 98–111)
Creatinine: 0.83 mg/dL (ref 0.61–1.24)
GFR, Est AFR Am: >60 mL/min (ref >60)
GFR, Estimated: >60 mL/min (ref >60)
Glucose, Bld: 105 mg/dL — ABNORMAL HIGH (ref 70–99)
Potassium: 3.9 mmol/L (ref 3.5–5.1)
Sodium: 139 mmol/L (ref 135–145)
Total Bilirubin: 0.9 mg/dL (ref 0.3–1.2)
Total Protein: 7 g/dL (ref 6.5–8.1)

## 2020-04-29 MED ORDER — PALONOSETRON HCL INJECTION 0.25 MG/5ML
INTRAVENOUS | Status: AC
  Filled 2020-04-29: qty 5

## 2020-04-29 MED ORDER — SODIUM CHLORIDE 0.9 % IV SOLN
10.0000 mg | Freq: Once | INTRAVENOUS | Status: AC
  Administered 2020-04-29: 10 mg via INTRAVENOUS
  Filled 2020-04-29: qty 10

## 2020-04-29 MED ORDER — SODIUM CHLORIDE 0.9 % IV SOLN
150.0000 mg | Freq: Once | INTRAVENOUS | Status: AC
  Administered 2020-04-29: 150 mg via INTRAVENOUS
  Filled 2020-04-29: qty 150

## 2020-04-29 MED ORDER — SODIUM CHLORIDE 0.9 % IV SOLN
100.0000 mg/m2 | Freq: Once | INTRAVENOUS | Status: AC
  Administered 2020-04-29: 190 mg via INTRAVENOUS
  Filled 2020-04-29: qty 9.5

## 2020-04-29 MED ORDER — PALONOSETRON HCL INJECTION 0.25 MG/5ML
0.2500 mg | Freq: Once | INTRAVENOUS | Status: AC
  Administered 2020-04-29: 0.25 mg via INTRAVENOUS

## 2020-04-29 MED ORDER — SODIUM CHLORIDE 0.9 % IV SOLN
Freq: Once | INTRAVENOUS | Status: AC
  Filled 2020-04-29: qty 250

## 2020-04-29 MED ORDER — SODIUM CHLORIDE 0.9 % IV SOLN
427.5000 mg | Freq: Once | INTRAVENOUS | Status: AC
  Administered 2020-04-29: 430 mg via INTRAVENOUS
  Filled 2020-04-29: qty 43

## 2020-04-29 NOTE — Progress Notes (Signed)
[name] [phone]) [phone]   Fax:([phone]  OFFICE PROGRESS NOTE  [name], MD [place] [address]  DIAGNOSIS: Stage IB (T2 a, N0, M0) small cell lung cancer presented with superior segment right lower lobe lung nodule.  PRIOR THERAPY: status post right lower lobe superior segmentectomy with lymph node sampling on March 12, 2020 under the care of Dr. Servando Snare.  CURRENT THERAPY: Systemic chemotherapy with carboplatin for AUC of 5 on day 1 and etoposide 100 mg/M2 on days 1, 2 and 3 with Neulasta support every 3 weeks.  Status post 1 cycle.  INTERVAL HISTORY: Alexander Murray 79 y.o. male returns to the clinic today for follow-up visit.  The patient is feeling fine today with no concerning complaints.  He tolerated the first cycle of his treatment well with no concerning adverse effect except for mild fatigue.  He denied having any nausea, vomiting, diarrhea or constipation.  He denied having any headache or visual changes.  He has no chest pain, shortness of breath, cough or hemoptysis.  He is here today for evaluation before starting cycle #2.  MEDICAL HISTORY: Past Medical History:  Diagnosis Date  . Allergy   . Arthritis    "right knee and right thumb" (12/19/2014)  . Asthma    pt denies  . Bronchitis 07/2016   started in December and continued about 2 months  . Esophageal stricture   . GERD (gastroesophageal reflux disease)   . Hemochromatosis   . Hypercalcemia   . Hypercholesteremia   . Hypertension   . Osteoarthritis   . Palpitations   . Presence of permanent cardiac pacemaker    hx bradycardia  . Retinal artery occlusion, branch    "right eye"  . Second degree AV block, Mobitz type II   . Skin cancer    right shoulder  . Stroke [facility]) ~ 2011   "right eye"/ partial blindness    ALLERGIES:  has No Known Allergies.  MEDICATIONS:  Current Outpatient Medications  Medication Sig Dispense Refill  .  acetaminophen (TYLENOL) 325 MG tablet Take 2 tablets (650 mg total) by mouth every 6 (six) hours as needed for mild pain or moderate pain.    Marland Kitchen amLODipine (NORVASC) 2.5 MG tablet Take 2.5 mg by mouth at bedtime.    Marland Kitchen aspirin 81 MG tablet Take 81 mg by mouth daily.    . celecoxib (CELEBREX) 200 MG capsule Take 1 capsule (200 mg total) by mouth 2 (two) times daily as needed for mild pain. 10 capsule 0  . chlorthalidone (HYGROTON) 25 MG tablet Take 25 mg by mouth every other day.     . clopidogrel (PLAVIX) 75 MG tablet Take 75 mg by mouth every other day.    . doxazosin (CARDURA) 4 MG tablet Take 4 mg by mouth daily.     Marland Kitchen esomeprazole (NEXIUM) 20 MG packet Take 20 mg by mouth daily before breakfast.    . Misc Natural Products (GLUCOSAMINE CHONDROITIN ADV PO) Take 1,500 mg by mouth daily.    . montelukast (SINGULAIR) 10 MG tablet Take 10 mg by mouth every morning.     . Multiple Vitamin (MULTIVITAMIN) capsule Take 1 capsule by mouth daily.    . Multiple Vitamins-Minerals (PRESERVISION AREDS 2 PO) Take 1 capsule by mouth daily.    . prochlorperazine (COMPAZINE) 10 MG tablet Take 1 tablet (10 mg total) by mouth every 6 (six) hours as needed for nausea or vomiting. 30 tablet 0  .  simvastatin (ZOCOR) 40 MG tablet Take 40 mg by mouth daily.    . vitamin C (ASCORBIC ACID) 500 MG tablet Take 500 mg by mouth daily.      No current facility-administered medications for this visit.    SURGICAL HISTORY:  Past Surgical History:  Procedure Laterality Date  . CATARACT EXTRACTION, BILATERAL  01/2017  . COLONOSCOPY    . EP IMPLANTABLE DEVICE N/A 12/19/2014   Procedure: Pacemaker Implant;  Surgeon: Deboraha Sprang, MD;  Location: [facility];  Service: Cardiovascular;  Laterality: N/A;  . ESOPHAGOGASTRODUODENOSCOPY (EGD) WITH ESOPHAGEAL DILATION  X 3  . EYE SURGERY Bilateral 2017  . INGUINAL HERNIA REPAIR Left 1980's  . INSERT / REPLACE / REMOVE PACEMAKER  12/19/2014  . INTERCOSTAL NERVE BLOCK Right  03/11/2020   Procedure: INTERCOSTAL NERVE BLOCK;  Surgeon: Grace Isaac, MD;  Location: [facility];  Service: Thoracic;  Laterality: Right;  . JOINT REPLACEMENT Right 2016  . KNEE ARTHROSCOPY Right ~ 1982; ~ 1992  . MOHS SURGERY Right ~ 2014   "pre-melanoma scapula"  . POSTERIOR TIBIAL TENDON REPAIR Left 2012  . TONSILLECTOMY  1946  . TOTAL KNEE ARTHROPLASTY Right 05/20/2015   Procedure: RIGHT TOTAL KNEE ARTHROPLASTY;  Surgeon: Paralee Cancel, MD;  [facility];  Service: Orthopedics;  Laterality: Right;  . UPPER GASTROINTESTINAL ENDOSCOPY    . VIDEO BRONCHOSCOPY N/A 03/11/2020   Procedure: VIDEO BRONCHOSCOPY;  Surgeon: Grace Isaac, MD;  Location: [facility];  Service: Thoracic;  Laterality: N/A;  . XI ROBOTIC ASSISTED THORACOSCOPY- SEGMENTECTOMY Right 03/11/2020   Procedure: XI ROBOTIC ASSISTED THORACOSCOPY-RIGHT SUPERIOR SEGMENTECTOMY WITH NODE SAMPLES;  Surgeon: Grace Isaac, MD;  Location: [facility];  Service: Thoracic;  Laterality: Right;    REVIEW OF SYSTEMS:  A comprehensive review of systems was negative except for: Constitutional: positive for fatigue   PHYSICAL EXAMINATION: General appearance: alert, cooperative, fatigued and no distress Head: Normocephalic, without obvious abnormality, atraumatic Neck: no adenopathy, no JVD, supple, symmetrical, trachea midline and thyroid not enlarged, symmetric, no tenderness/mass/nodules Lymph nodes: Cervical, supraclavicular, and axillary nodes normal. Resp: clear to auscultation bilaterally Back: symmetric, no curvature. ROM normal. No CVA tenderness. Cardio: regular rate and rhythm, S1, S2 normal, no murmur, click, rub or gallop GI: soft, non-tender; bowel sounds normal; no masses,  no organomegaly Extremities: extremities normal, atraumatic, no cyanosis or edema  ECOG PERFORMANCE STATUS: 1 - Symptomatic but completely ambulatory  Blood pressure 134/78, pulse 71, temperature (!) 97.5 F (36.4 C), temperature source Tympanic, resp.  rate 16, height 5\' 10"  (1.778 m), weight 161 lb (73 kg), SpO2 100 %.  LABORATORY DATA: Lab Results  Component Value Date   WBC 10.5 04/29/2020   HGB 12.3 (L) 04/29/2020   HCT 36.4 (L) 04/29/2020   MCV 91.7 04/29/2020   PLT 206 04/29/2020      Chemistry      Component Value Date/Time   NA 139 04/29/2020 1107   K 3.9 04/29/2020 1107   CL 107 04/29/2020 1107   CO2 27 04/29/2020 1107   BUN 10 04/29/2020 1107   CREATININE 0.83 04/29/2020 1107      Component Value Date/Time   CALCIUM 10.3 04/29/2020 1107   ALKPHOS 235 (H) 04/29/2020 1107   AST 25 04/29/2020 1107   ALT 23 04/29/2020 1107   BILITOT 0.9 04/29/2020 1107       RADIOGRAPHIC STUDIES: MR BRAIN W WO CONTRAST  Result Date: 04/15/2020 CLINICAL DATA:  Small cell lung cancer.  Staging. EXAM:  MRI HEAD WITHOUT AND WITH CONTRAST TECHNIQUE: Multiplanar, multiecho pulse sequences of the brain and surrounding structures were obtained without and with intravenous contrast. CONTRAST:  7.40mL GADAVIST GADOBUTROL 1 MMOL/ML IV SOLN COMPARISON:  CT 03/13/2020 FINDINGS: Brain: Diffusion imaging does not show any acute or subacute infarction. No focal finding affects the brainstem or cerebellum. Cerebral hemispheres show mild to moderate chronic small-vessel ischemic change of the white matter, often seen at this age. No cortical or large vessel territory infarction. No abnormal enhancement. No sign of metastatic disease. No hemorrhage, hydrocephalus or extra-axial collection. Vascular: Major vessels at the base of the brain show flow. Skull and upper cervical spine: Negative Sinuses/Orbits: Clear/normal Other: None IMPRESSION: 1. No evidence of metastatic disease. 2. Mild to moderate chronic small-vessel ischemic change of the cerebral hemispheric white matter, often seen at this age. Electronically Signed   By: Nelson Chimes M.D.   On: 04/15/2020 15:36    ASSESSMENT AND PLAN: This is a very pleasant 79 years old white male diagnosed with a  stage Ib (T2 a, N0, M0) small cell lung cancer presented with superior segment right lower lobe nodule status post right lower lobe superior segmentectomy with lymph node dissection in August 2021 under the care of Dr. Servando Snare. The patient is currently undergoing adjuvant treatment with systemic chemotherapy with carboplatin for AUC of 5 on day 1 and etoposide 100 mg/M2 on days 1, 2 and 3 with Neulasta support.  He status post 1 cycle. He tolerated the first cycle of his treatment well with no concerning adverse effects. I recommended for the patient to proceed with cycle #2 today as planned. He will come back for follow-up visit in 3 weeks for evaluation before starting cycle #3. The patient was advised to call immediately if he has any concerning symptoms in the interval. The patient voices understanding of current disease status and treatment options and is in agreement with the current care plan.  All questions were answered. The patient knows to call the clinic with any problems, questions or concerns. We can certainly see the patient much sooner if necessary.  Disclaimer: This note was dictated with voice recognition software. Similar sounding words can inadvertently be transcribed and may not be corrected upon review.

## 2020-04-29 NOTE — Patient Instructions (Signed)
[facility] Discharge Instructions for Patients Receiving Chemotherapy  Today you received the following chemotherapy agents Etoposide and Carboplatin  To help prevent nausea and vomiting after your treatment, we encourage you to take your nausea medication as directed.   If you develop nausea and vomiting that is not controlled by your nausea medication, call the clinic.   BELOW ARE SYMPTOMS THAT SHOULD BE REPORTED IMMEDIATELY:  *FEVER GREATER THAN 100.5 F  *CHILLS WITH OR WITHOUT FEVER  NAUSEA AND VOMITING THAT IS NOT CONTROLLED WITH YOUR NAUSEA MEDICATION  *UNUSUAL SHORTNESS OF BREATH  *UNUSUAL BRUISING OR BLEEDING  TENDERNESS IN MOUTH AND THROAT WITH OR WITHOUT PRESENCE OF ULCERS  *URINARY PROBLEMS  *BOWEL PROBLEMS  UNUSUAL RASH Items with * indicate a potential emergency and should be followed up as soon as possible.  Feel free to call the clinic should you have any questions or concerns. The clinic phone number is [phone].  Please show the [place] at check-in to the Emergency Department and triage nurse.

## 2020-04-30 ENCOUNTER — Inpatient Hospital Stay: Payer: Medicare Other

## 2020-04-30 ENCOUNTER — Other Ambulatory Visit: Payer: Self-pay

## 2020-04-30 VITALS — BP 134/71 | HR 82 | Temp 97.9°F | Resp 18

## 2020-04-30 DIAGNOSIS — C3431 Malignant neoplasm of lower lobe, right bronchus or lung: Secondary | ICD-10-CM

## 2020-04-30 DIAGNOSIS — J45909 Unspecified asthma, uncomplicated: Secondary | ICD-10-CM | POA: Diagnosis not present

## 2020-04-30 DIAGNOSIS — K219 Gastro-esophageal reflux disease without esophagitis: Secondary | ICD-10-CM | POA: Diagnosis not present

## 2020-04-30 DIAGNOSIS — R5383 Other fatigue: Secondary | ICD-10-CM | POA: Diagnosis not present

## 2020-04-30 DIAGNOSIS — Z5111 Encounter for antineoplastic chemotherapy: Secondary | ICD-10-CM | POA: Diagnosis not present

## 2020-04-30 MED ORDER — SODIUM CHLORIDE 0.9 % IV SOLN
100.0000 mg/m2 | Freq: Once | INTRAVENOUS | Status: AC
  Administered 2020-04-30: 190 mg via INTRAVENOUS
  Filled 2020-04-30: qty 9.5

## 2020-04-30 MED ORDER — SODIUM CHLORIDE 0.9 % IV SOLN
10.0000 mg | Freq: Once | INTRAVENOUS | Status: AC
  Administered 2020-04-30: 10 mg via INTRAVENOUS
  Filled 2020-04-30: qty 10

## 2020-04-30 MED ORDER — SODIUM CHLORIDE 0.9 % IV SOLN
Freq: Once | INTRAVENOUS | Status: AC
  Filled 2020-04-30: qty 250

## 2020-05-01 ENCOUNTER — Other Ambulatory Visit: Payer: Self-pay

## 2020-05-01 ENCOUNTER — Telehealth: Payer: Self-pay | Admitting: Medical Oncology

## 2020-05-01 ENCOUNTER — Inpatient Hospital Stay: Payer: Medicare Other

## 2020-05-01 VITALS — BP 154/90 | HR 97 | Temp 97.6°F | Resp 16

## 2020-05-01 DIAGNOSIS — R5383 Other fatigue: Secondary | ICD-10-CM | POA: Diagnosis not present

## 2020-05-01 DIAGNOSIS — J45909 Unspecified asthma, uncomplicated: Secondary | ICD-10-CM | POA: Diagnosis not present

## 2020-05-01 DIAGNOSIS — K219 Gastro-esophageal reflux disease without esophagitis: Secondary | ICD-10-CM | POA: Diagnosis not present

## 2020-05-01 DIAGNOSIS — Z5111 Encounter for antineoplastic chemotherapy: Secondary | ICD-10-CM | POA: Diagnosis not present

## 2020-05-01 DIAGNOSIS — C3431 Malignant neoplasm of lower lobe, right bronchus or lung: Secondary | ICD-10-CM | POA: Diagnosis not present

## 2020-05-01 MED ORDER — SODIUM CHLORIDE 0.9 % IV SOLN
100.0000 mg/m2 | Freq: Once | INTRAVENOUS | Status: AC
  Administered 2020-05-01: 190 mg via INTRAVENOUS
  Filled 2020-05-01: qty 9.5

## 2020-05-01 MED ORDER — SODIUM CHLORIDE 0.9 % IV SOLN
Freq: Once | INTRAVENOUS | Status: AC
  Filled 2020-05-01: qty 250

## 2020-05-01 MED ORDER — SODIUM CHLORIDE 0.9 % IV SOLN
10.0000 mg | Freq: Once | INTRAVENOUS | Status: AC
  Administered 2020-05-01: 10 mg via INTRAVENOUS
  Filled 2020-05-01: qty 10

## 2020-05-01 NOTE — Telephone Encounter (Signed)
State he was exposed to a person last Sunday who tested positive today for COVID . Pt states he is is asymptomatic. Can he come in today for tx.? Per Julien Nordmann I told pt he can come for tx today .

## 2020-05-01 NOTE — Patient Instructions (Signed)
[facility] Discharge Instructions for Patients Receiving Chemotherapy  Today you received the following chemotherapy agents: Etoposide  To help prevent nausea and vomiting after your treatment, we encourage you to take your nausea medication  as prescribed.    If you develop nausea and vomiting that is not controlled by your nausea medication, call the clinic.   BELOW ARE SYMPTOMS THAT SHOULD BE REPORTED IMMEDIATELY:  *FEVER GREATER THAN 100.5 F  *CHILLS WITH OR WITHOUT FEVER  NAUSEA AND VOMITING THAT IS NOT CONTROLLED WITH YOUR NAUSEA MEDICATION  *UNUSUAL SHORTNESS OF BREATH  *UNUSUAL BRUISING OR BLEEDING  TENDERNESS IN MOUTH AND THROAT WITH OR WITHOUT PRESENCE OF ULCERS  *URINARY PROBLEMS  *BOWEL PROBLEMS  UNUSUAL RASH Items with * indicate a potential emergency and should be followed up as soon as possible.  Feel free to call the clinic should you have any questions or concerns. The clinic phone number is [phone].  Please show the [facility] at check-in to the Emergency Department and triage nurse.

## 2020-05-03 ENCOUNTER — Inpatient Hospital Stay: Payer: Medicare Other | Attending: Physician Assistant

## 2020-05-03 ENCOUNTER — Other Ambulatory Visit: Payer: Self-pay

## 2020-05-03 VITALS — BP 121/71 | HR 74 | Resp 18

## 2020-05-03 DIAGNOSIS — I712 Thoracic aortic aneurysm, without rupture: Secondary | ICD-10-CM | POA: Insufficient documentation

## 2020-05-03 DIAGNOSIS — Z5189 Encounter for other specified aftercare: Secondary | ICD-10-CM | POA: Insufficient documentation

## 2020-05-03 DIAGNOSIS — I10 Essential (primary) hypertension: Secondary | ICD-10-CM | POA: Diagnosis not present

## 2020-05-03 DIAGNOSIS — K219 Gastro-esophageal reflux disease without esophagitis: Secondary | ICD-10-CM | POA: Insufficient documentation

## 2020-05-03 DIAGNOSIS — K222 Esophageal obstruction: Secondary | ICD-10-CM | POA: Diagnosis not present

## 2020-05-03 DIAGNOSIS — C3431 Malignant neoplasm of lower lobe, right bronchus or lung: Secondary | ICD-10-CM

## 2020-05-03 DIAGNOSIS — M1990 Unspecified osteoarthritis, unspecified site: Secondary | ICD-10-CM | POA: Insufficient documentation

## 2020-05-03 DIAGNOSIS — Z79899 Other long term (current) drug therapy: Secondary | ICD-10-CM | POA: Insufficient documentation

## 2020-05-03 DIAGNOSIS — K449 Diaphragmatic hernia without obstruction or gangrene: Secondary | ICD-10-CM | POA: Diagnosis not present

## 2020-05-03 DIAGNOSIS — I700 Atherosclerosis of aorta: Secondary | ICD-10-CM | POA: Insufficient documentation

## 2020-05-03 DIAGNOSIS — E7800 Pure hypercholesterolemia, unspecified: Secondary | ICD-10-CM | POA: Insufficient documentation

## 2020-05-03 DIAGNOSIS — J439 Emphysema, unspecified: Secondary | ICD-10-CM | POA: Diagnosis not present

## 2020-05-03 DIAGNOSIS — Z8673 Personal history of transient ischemic attack (TIA), and cerebral infarction without residual deficits: Secondary | ICD-10-CM | POA: Diagnosis not present

## 2020-05-03 DIAGNOSIS — M129 Arthropathy, unspecified: Secondary | ICD-10-CM | POA: Diagnosis not present

## 2020-05-03 DIAGNOSIS — R000 Tachycardia, unspecified: Secondary | ICD-10-CM | POA: Diagnosis not present

## 2020-05-03 DIAGNOSIS — Z5111 Encounter for antineoplastic chemotherapy: Secondary | ICD-10-CM | POA: Diagnosis not present

## 2020-05-03 MED ORDER — PEGFILGRASTIM-JMDB 6 MG/0.6ML ~~LOC~~ SOSY
PREFILLED_SYRINGE | SUBCUTANEOUS | Status: AC
  Filled 2020-05-03: qty 0.6

## 2020-05-03 MED ORDER — PEGFILGRASTIM-JMDB 6 MG/0.6ML ~~LOC~~ SOSY
6.0000 mg | PREFILLED_SYRINGE | Freq: Once | SUBCUTANEOUS | Status: AC
  Administered 2020-05-03: 6 mg via SUBCUTANEOUS

## 2020-05-06 ENCOUNTER — Other Ambulatory Visit: Payer: Self-pay

## 2020-05-06 ENCOUNTER — Inpatient Hospital Stay: Payer: Medicare Other

## 2020-05-06 DIAGNOSIS — C3431 Malignant neoplasm of lower lobe, right bronchus or lung: Secondary | ICD-10-CM

## 2020-05-06 DIAGNOSIS — K222 Esophageal obstruction: Secondary | ICD-10-CM | POA: Diagnosis not present

## 2020-05-06 DIAGNOSIS — K219 Gastro-esophageal reflux disease without esophagitis: Secondary | ICD-10-CM | POA: Diagnosis not present

## 2020-05-06 DIAGNOSIS — Z5189 Encounter for other specified aftercare: Secondary | ICD-10-CM | POA: Diagnosis not present

## 2020-05-06 DIAGNOSIS — M129 Arthropathy, unspecified: Secondary | ICD-10-CM | POA: Diagnosis not present

## 2020-05-06 DIAGNOSIS — Z5111 Encounter for antineoplastic chemotherapy: Secondary | ICD-10-CM | POA: Diagnosis not present

## 2020-05-06 LAB — CMP (CANCER CENTER ONLY)
ALT: 33 U/L (ref 0–44)
AST: 21 U/L (ref 15–41)
Albumin: 3.5 g/dL (ref 3.5–5.0)
Alkaline Phosphatase: 223 U/L — ABNORMAL HIGH (ref 38–126)
Anion gap: 6 (ref 5–15)
BUN: 17 mg/dL (ref 8–23)
CO2: 30 mmol/L (ref 22–32)
Calcium: 10.3 mg/dL (ref 8.9–10.3)
Chloride: 103 mmol/L (ref 98–111)
Creatinine: 0.8 mg/dL (ref 0.61–1.24)
GFR, Est AFR Am: >60 mL/min (ref >60)
GFR, Estimated: >60 mL/min (ref >60)
Glucose, Bld: 94 mg/dL (ref 70–99)
Potassium: 3.8 mmol/L (ref 3.5–5.1)
Sodium: 139 mmol/L (ref 135–145)
Total Bilirubin: 1.6 mg/dL — ABNORMAL HIGH (ref 0.3–1.2)
Total Protein: 6.1 g/dL — ABNORMAL LOW (ref 6.5–8.1)

## 2020-05-06 LAB — CBC WITH DIFFERENTIAL (CANCER CENTER ONLY)
Abs Immature Granulocytes: 0.3 10*3/uL — ABNORMAL HIGH (ref 0.00–0.07)
Band Neutrophils: 5 %
Basophils Absolute: 0.1 10*3/uL (ref 0.0–0.1)
Basophils Relative: 1 %
Eosinophils Absolute: 0 10*3/uL (ref 0.0–0.5)
Eosinophils Relative: 0 %
HCT: 29.5 % — ABNORMAL LOW (ref 39.0–52.0)
Hemoglobin: 9.8 g/dL — ABNORMAL LOW (ref 13.0–17.0)
Lymphocytes Relative: 4 %
Lymphs Abs: 0.5 10*3/uL — ABNORMAL LOW (ref 0.7–4.0)
MCH: 30.7 pg (ref 26.0–34.0)
MCHC: 33.2 g/dL (ref 30.0–36.0)
MCV: 92.5 fL (ref 80.0–100.0)
Metamyelocytes Relative: 2 %
Monocytes Absolute: 0.3 10*3/uL (ref 0.1–1.0)
Monocytes Relative: 2 %
Neutro Abs: 12.4 10*3/uL — ABNORMAL HIGH (ref 1.7–7.7)
Neutrophils Relative %: 86 %
Platelet Count: 132 10*3/uL — ABNORMAL LOW (ref 150–400)
RBC: 3.19 MIL/uL — ABNORMAL LOW (ref 4.22–5.81)
RDW: 17.7 % — ABNORMAL HIGH (ref 11.5–15.5)
WBC Count: 13.6 10*3/uL — ABNORMAL HIGH (ref 4.0–10.5)
nRBC: 0 % (ref 0.0–0.2)

## 2020-05-07 ENCOUNTER — Telehealth: Payer: Self-pay | Admitting: Medical Oncology

## 2020-05-07 NOTE — Telephone Encounter (Signed)
Yes.  It could be related to his chemo but I am waiting for the next lab results to monitor the trend.  Thank you.

## 2020-05-07 NOTE — Telephone Encounter (Addendum)
Constipation- Diet changes are not helping-Pt will start miralax . Elevated Alk phos and bilirubin- Does he need to be concerned?

## 2020-05-08 ENCOUNTER — Telehealth: Payer: Self-pay | Admitting: *Deleted

## 2020-05-08 NOTE — Telephone Encounter (Signed)
LM that Dr Julien Nordmann is going to review the next set of labs to see if they are still elevated. Will decided what to do at that point. To call if he has further questions

## 2020-05-10 ENCOUNTER — Ambulatory Visit (INDEPENDENT_AMBULATORY_CARE_PROVIDER_SITE_OTHER): Payer: Medicare Other | Admitting: Gastroenterology

## 2020-05-10 ENCOUNTER — Telehealth: Payer: Self-pay | Admitting: Internal Medicine

## 2020-05-10 ENCOUNTER — Telehealth: Payer: Self-pay

## 2020-05-10 ENCOUNTER — Encounter: Payer: Self-pay | Admitting: *Deleted

## 2020-05-10 VITALS — BP 120/60 | HR 64 | Ht 70.0 in | Wt 167.8 lb

## 2020-05-10 DIAGNOSIS — R1032 Left lower quadrant pain: Secondary | ICD-10-CM

## 2020-05-10 MED ORDER — HYOSCYAMINE SULFATE 0.125 MG SL SUBL: 0.1250 mg | SUBLINGUAL_TABLET | Freq: Three times a day (TID) | SUBLINGUAL | 1 refills | Status: DC | PRN

## 2020-05-10 NOTE — Progress Notes (Signed)
Noted.  Thank you for seeing [name]

## 2020-05-10 NOTE — Telephone Encounter (Signed)
Pt called stating he is having some worsening abdominal pain and tenderness in one spot.  Concern discussed with Cassie PA-C, who advised pt has a GI appt this afternoon and this is appropriate as his PET revealed no malignancy in his abdomen/pelvis.  I have spoken with the pt and advised of this. Pt was seen by GI this afternoon and expressed pts PET scan results, however, have scheduled a CT of the abdomen/pelvis as well. Pt was also given a rx to address the "crampy" feeling he is experiencing. Pt was satisfied with their plan.

## 2020-05-10 NOTE — Patient Instructions (Signed)
If you are age 81 or older, your body mass index should be between 23-30. Your Body mass index is 24.08 kg/m. If this is out of the aforementioned range listed, please consider follow up with your Primary Care Provider.  If you are age 31 or younger, your body mass index should be between 19-25. Your Body mass index is 24.08 kg/m. If this is out of the aformentioned range listed, please consider follow up with your Primary Care Provider.   You have been scheduled for a CT scan of the abdomen and pelvis at [facility], 1st floor Radiology. You are scheduled on 05/16/20  at 7:30 am. You should arrive 15 minutes prior to your appointment time for registration.  Please pick up 2 bottles of contrast from [place] at least 3 days prior to your scan. The solution may taste better if refrigerated, but do NOT add ice or any other liquid to this solution. Shake well before drinking.   Please follow the written instructions below on the day of your exam:   1) Do not eat anything after 3:30 am (4 hours prior to your test)   2) Drink 1 bottle of contrast @ 5:30 am (2 hours prior to your exam)  Remember to shake well before drinking and do NOT pour over ice.     Drink 1 bottle of contrast @ 6:30 am (1 hour prior to your exam)   You may take any medications as prescribed with a small amount of water, if necessary. If you take any of the following medications: METFORMIN, GLUCOPHAGE, GLUCOVANCE, AVANDAMET, RIOMET, FORTAMET, [place] MET, JANUMET, GLUMETZA or METAGLIP, you MAY be asked to HOLD this medication 48 hours AFTER the exam.   The purpose of you drinking the oral contrast is to aid in the visualization of your intestinal tract. The contrast solution may cause some diarrhea. Depending on your individual set of symptoms, you may also receive an intravenous injection of x-ray contrast/dye. Plan on being at [facility] for 45 minutes or longer, depending on the type of exam you are having performed.    If you have any questions regarding your exam or if you need to reschedule, you may call [facility] at [phone] between the hours of 8:00 am and 5:00 pm, Monday-Friday.   START Hycosyamine 0.125 mg 1 tablet every 8 hours as needed for abdominal pain.  We will contact you with the results of your Ct and and need for follow up.

## 2020-05-10 NOTE — Progress Notes (Signed)
05/10/2020 Alexander Murray [id number] 06-27-1941   HISTORY OF PRESENT ILLNESS: This is a pleasant 79 year old male who is a patient of Dr. Blanch Media.  He actually just underwent EGD and colonoscopy with Dr. Henrene Pastor in June of this year.  He had 6 small polyps removed.  No evidence of diverticular disease noted.  Since then he has been diagnosed with lung cancer.  He had that resected, but has now undergoing chemotherapy with Dr. Earlie Server.  He had a PET scan for staging that did not show any sign of malignancy in his abdomen or pelvis.  Nonetheless he is here today with complaints of left-sided abdominal pain.  He says it has been present maybe for a few weeks or so that he has noticed some minimal discomfort that he paid no attention to.  Then it has become more noticeable over the past 10 days or so.  Overnight it was particularly bothersome.  He rates it only as a 4 out of 10 on the pain scale.  He denies any associated nausea, vomiting, fever, chills.  He is using MiraLAX daily for constipation related to his chemotherapy and is having 2-3 bowel movements a day with that.   Past Medical History:  Diagnosis Date  . Allergy   . Arthritis    "right knee and right thumb" (12/19/2014)  . Asthma    pt denies  . Bronchitis 07/2016   started in December and continued about 2 months  . Esophageal stricture   . GERD (gastroesophageal reflux disease)   . Hemochromatosis   . Hiatal hernia   . Hypercalcemia   . Hypercholesteremia   . Hypertension   . Internal hemorrhoids   . Lung cancer ([place])   . Osteoarthritis   . Palpitations   . Presence of permanent cardiac pacemaker    hx bradycardia  . Retinal artery occlusion, branch    "right eye"  . Second degree AV block, Mobitz type II   . Skin cancer    right shoulder  . Stroke [facility]) ~ 2011   "right eye"/ partial blindness  . Tubular adenoma of colon    Past Surgical History:  Procedure Laterality Date  . CATARACT EXTRACTION, BILATERAL   01/2017  . COLONOSCOPY    . EP IMPLANTABLE DEVICE N/A 12/19/2014   Procedure: Pacemaker Implant;  Surgeon: Deboraha Sprang, MD;  Location: [place] [facility];  Service: Cardiovascular;  Laterality: N/A;  . ESOPHAGOGASTRODUODENOSCOPY (EGD) WITH ESOPHAGEAL DILATION  X 3  . EYE SURGERY Bilateral 2017  . INGUINAL HERNIA REPAIR Left 1980's  . INSERT / REPLACE / REMOVE PACEMAKER  12/19/2014  . INTERCOSTAL NERVE BLOCK Right 03/11/2020   Procedure: INTERCOSTAL NERVE BLOCK;  Surgeon: Grace Isaac, MD;  Location: [place];  Service: Thoracic;  Laterality: Right;  . JOINT REPLACEMENT Right 2016  . KNEE ARTHROSCOPY Right ~ 1982; ~ 1992  . MOHS SURGERY Right ~ 2014   "pre-melanoma scapula"  . POSTERIOR TIBIAL TENDON REPAIR Left 2012  . TONSILLECTOMY  1946  . TOTAL KNEE ARTHROPLASTY Right 05/20/2015   Procedure: RIGHT TOTAL KNEE ARTHROPLASTY;  Surgeon: Paralee Cancel, MD;  Location: [facility];  Service: Orthopedics;  Laterality: Right;  . UPPER GASTROINTESTINAL ENDOSCOPY    . VIDEO BRONCHOSCOPY N/A 03/11/2020   Procedure: VIDEO BRONCHOSCOPY;  Surgeon: Grace Isaac, MD;  Location: [facility];  Service: Thoracic;  Laterality: N/A;  . XI ROBOTIC ASSISTED THORACOSCOPY- SEGMENTECTOMY Right 03/11/2020   Procedure: XI ROBOTIC ASSISTED THORACOSCOPY-RIGHT SUPERIOR SEGMENTECTOMY WITH  NODE SAMPLES;  Surgeon: Grace Isaac, MD;  Location: [place];  Service: Thoracic;  Laterality: Right;    reports that he quit smoking about 58 years ago. His smoking use included cigarettes. He quit after 3.00 years of use. He has never used smokeless tobacco. He reports that he does not drink alcohol and does not use drugs. family history includes Alzheimer's disease in his mother; Colon cancer in his father and mother; Heart disease in his father and mother; Hemochromatosis in his brother; Prostate cancer in his brother, brother, and father. No Known Allergies    Outpatient Encounter Medications as of 05/10/2020  Medication Sig  .  acetaminophen (TYLENOL) 325 MG tablet Take 2 tablets (650 mg total) by mouth every 6 (six) hours as needed for mild pain or moderate pain.  Marland Kitchen amLODipine (NORVASC) 2.5 MG tablet Take 2.5 mg by mouth at bedtime.  Marland Kitchen aspirin 81 MG tablet Take 81 mg by mouth daily.  . celecoxib (CELEBREX) 200 MG capsule Take 1 capsule (200 mg total) by mouth 2 (two) times daily as needed for mild pain.  . chlorthalidone (HYGROTON) 25 MG tablet Take 25 mg by mouth every other day.   . clopidogrel (PLAVIX) 75 MG tablet Take 75 mg by mouth every other day.  . doxazosin (CARDURA) 4 MG tablet Take 4 mg by mouth daily.   Marland Kitchen esomeprazole (NEXIUM) 20 MG packet Take 20 mg by mouth daily before breakfast.  . Misc Natural Products (GLUCOSAMINE CHONDROITIN ADV PO) Take 1,500 mg by mouth daily.  . montelukast (SINGULAIR) 10 MG tablet Take 10 mg by mouth every morning.   . Multiple Vitamin (MULTIVITAMIN) capsule Take 1 capsule by mouth daily.  . Multiple Vitamins-Minerals (PRESERVISION AREDS 2 PO) Take 1 capsule by mouth daily.  . prochlorperazine (COMPAZINE) 10 MG tablet Take 1 tablet (10 mg total) by mouth every 6 (six) hours as needed for nausea or vomiting.  . simvastatin (ZOCOR) 40 MG tablet Take 40 mg by mouth daily.  . vitamin C (ASCORBIC ACID) 500 MG tablet Take 500 mg by mouth daily.    No facility-administered encounter medications on file as of 05/10/2020.     REVIEW OF SYSTEMS  : All other systems reviewed and negative except where noted in the History of Present Illness.   PHYSICAL EXAM: BP 120/60   Pulse 64   Ht 5\' 10"  (1.778 m)   Wt 167 lb 12.8 oz (76.1 kg)   BMI 24.08 kg/m  General: Well developed white male in no acute distress Head: Normocephalic and atraumatic Eyes:  Sclerae anicteric, conjunctiva pink. Ears: Normal auditory acuity Lungs: Clear throughout to auscultation; no W/R/R. Heart: Regular rate and rhythm; no M/R/G. Abdomen: Soft, non-distended.  BS present.  Mild TTP just to the left of the  umbilicus. Musculoskeletal: Symmetrical with no gross deformities  Skin: No lesions on visible extremities Extremities: No edema  Neurological: Alert oriented x 4, grossly non-focal Psychological:  Alert and cooperative. Normal mood and affect  ASSESSMENT AND PLAN: *Left lower quadrant to left mid abdominal pain: Comes and goes for a little while now, but more noticeable over the past 10 days or so, particularly overnight last night.  His abdominal exam is benign.  He does not have diverticular disease by 2 previous colonoscopies (last colonoscopy in June of this year).  He has no other associated symptoms.  He is moving his bowels 2 or 3 times a day with MiraLAX.  PET scan did not show any sign of  malignancy in his abdomen or pelvis.  I am not really sure what is causing his pain.  We will plan for CT scan of the abdomen and pelvis with contrast next week to evaluate this.  In the interim I am going to have him try using some Levsin on an as-needed basis for spasm/cramping.  Prescription was sent to his pharmacy.  He was advised that if the pain becomes very severe or if he develops any other associated symptoms such as fever, vomiting, etc. then he would need to proceed to the emergency department.   CC:  [name], MD

## 2020-05-10 NOTE — Telephone Encounter (Signed)
Pt states he has been having abdominal pain in the LLQ of his abdomen. Reports it has gradually getting worse and he is concerned. Pt being see at cancer center for lung cancer, encouraged pt to let them know about his discomfort also. Pt scheduled to see Alonza Bogus PA today at 2pm. Pt aware of appt.

## 2020-05-10 NOTE — Telephone Encounter (Signed)
Pt called to inform that he has been developing abdominal tendernes and pain, he is not sure if this is caused by the chemo therapy that he is getting. Pain is not severe but it is progressively getting worse. Pt does not want to go with pain through the weekend and would like to be seen today or some advise.

## 2020-05-13 ENCOUNTER — Inpatient Hospital Stay: Payer: Medicare Other

## 2020-05-13 ENCOUNTER — Other Ambulatory Visit: Payer: Self-pay

## 2020-05-13 DIAGNOSIS — K222 Esophageal obstruction: Secondary | ICD-10-CM | POA: Diagnosis not present

## 2020-05-13 DIAGNOSIS — M129 Arthropathy, unspecified: Secondary | ICD-10-CM | POA: Diagnosis not present

## 2020-05-13 DIAGNOSIS — K219 Gastro-esophageal reflux disease without esophagitis: Secondary | ICD-10-CM | POA: Diagnosis not present

## 2020-05-13 DIAGNOSIS — Z5189 Encounter for other specified aftercare: Secondary | ICD-10-CM | POA: Diagnosis not present

## 2020-05-13 DIAGNOSIS — C3431 Malignant neoplasm of lower lobe, right bronchus or lung: Secondary | ICD-10-CM

## 2020-05-13 DIAGNOSIS — Z5111 Encounter for antineoplastic chemotherapy: Secondary | ICD-10-CM | POA: Diagnosis not present

## 2020-05-13 LAB — CMP (CANCER CENTER ONLY)
ALT: 35 U/L (ref 0–44)
AST: 28 U/L (ref 15–41)
Albumin: 3.6 g/dL (ref 3.5–5.0)
Alkaline Phosphatase: 307 U/L — ABNORMAL HIGH (ref 38–126)
Anion gap: 6 (ref 5–15)
BUN: 5 mg/dL — ABNORMAL LOW (ref 8–23)
CO2: 29 mmol/L (ref 22–32)
Calcium: 10.5 mg/dL — ABNORMAL HIGH (ref 8.9–10.3)
Chloride: 106 mmol/L (ref 98–111)
Creatinine: 0.9 mg/dL (ref 0.61–1.24)
GFR, Estimated: >60 mL/min (ref >60)
Glucose, Bld: 134 mg/dL — ABNORMAL HIGH (ref 70–99)
Potassium: 3.8 mmol/L (ref 3.5–5.1)
Sodium: 141 mmol/L (ref 135–145)
Total Bilirubin: 0.7 mg/dL (ref 0.3–1.2)
Total Protein: 6.6 g/dL (ref 6.5–8.1)

## 2020-05-13 LAB — CBC WITH DIFFERENTIAL (CANCER CENTER ONLY)
Abs Immature Granulocytes: 1.68 10*3/uL — ABNORMAL HIGH (ref 0.00–0.07)
Basophils Absolute: 0.1 10*3/uL (ref 0.0–0.1)
Basophils Relative: 0 %
Eosinophils Absolute: 0.1 10*3/uL (ref 0.0–0.5)
Eosinophils Relative: 1 %
HCT: 33.1 % — ABNORMAL LOW (ref 39.0–52.0)
Hemoglobin: 10.9 g/dL — ABNORMAL LOW (ref 13.0–17.0)
Immature Granulocytes: 13 %
Lymphocytes Relative: 10 %
Lymphs Abs: 1.4 10*3/uL (ref 0.7–4.0)
MCH: 31.1 pg (ref 26.0–34.0)
MCHC: 32.9 g/dL (ref 30.0–36.0)
MCV: 94.3 fL (ref 80.0–100.0)
Monocytes Absolute: 0.9 10*3/uL (ref 0.1–1.0)
Monocytes Relative: 7 %
Neutro Abs: 9.3 10*3/uL — ABNORMAL HIGH (ref 1.7–7.7)
Neutrophils Relative %: 69 %
Platelet Count: 56 10*3/uL — ABNORMAL LOW (ref 150–400)
RBC: 3.51 MIL/uL — ABNORMAL LOW (ref 4.22–5.81)
RDW: 20 % — ABNORMAL HIGH (ref 11.5–15.5)
WBC Count: 13.4 10*3/uL — ABNORMAL HIGH (ref 4.0–10.5)
nRBC: 0 % (ref 0.0–0.2)

## 2020-05-15 DIAGNOSIS — H353132 Nonexudative age-related macular degeneration, bilateral, intermediate dry stage: Secondary | ICD-10-CM | POA: Diagnosis not present

## 2020-05-15 DIAGNOSIS — H3411 Central retinal artery occlusion, right eye: Secondary | ICD-10-CM | POA: Diagnosis not present

## 2020-05-15 DIAGNOSIS — H43813 Vitreous degeneration, bilateral: Secondary | ICD-10-CM | POA: Diagnosis not present

## 2020-05-15 DIAGNOSIS — H524 Presbyopia: Secondary | ICD-10-CM | POA: Diagnosis not present

## 2020-05-16 ENCOUNTER — Ambulatory Visit (HOSPITAL_COMMUNITY)
Admission: RE | Admit: 2020-05-16 | Discharge: 2020-05-16 | Disposition: A | Discharge status: Home / Self Care | Payer: Medicare Other | Source: Ambulatory Visit | Attending: Gastroenterology | Admitting: Gastroenterology

## 2020-05-16 ENCOUNTER — Encounter (HOSPITAL_COMMUNITY): Payer: Self-pay

## 2020-05-16 ENCOUNTER — Other Ambulatory Visit: Payer: Self-pay

## 2020-05-16 DIAGNOSIS — R1032 Left lower quadrant pain: Secondary | ICD-10-CM | POA: Diagnosis not present

## 2020-05-16 DIAGNOSIS — R109 Unspecified abdominal pain: Secondary | ICD-10-CM | POA: Diagnosis not present

## 2020-05-16 IMAGING — CT CT ABD-PELV W/ CM
2 of 5 series · 15 of 46 positions shown, 17 images · IV contrast (omnipaque)
Comparison: PET-CT from [DATE]

CLINICAL DATA: Left lower quadrant abdominal pain over the last
weeks. Constipation. Chemotherapy in progress for small cell lung
cancer. Prior right lower lobectomy.

EXAM:
CT ABDOMEN AND PELVIS WITH CONTRAST
TECHNIQUE: Multidetector CT imaging of the abdomen and pelvis was performed
using the standard protocol following bolus administration of
intravenous contrast.
CONTRAST:  100mL OMNIPAQUE IOHEXOL 300 MG/ML  SOLN

[Series 2: axial st · axial · 0.78mm/px · z∈[-490,-70]mm · 12 of 98 slices shown, 14 images]
[im 7/98  soft-tissue]
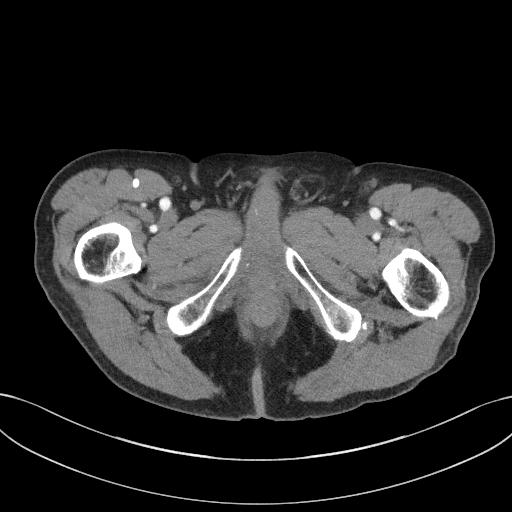
[im 7/98  bone]
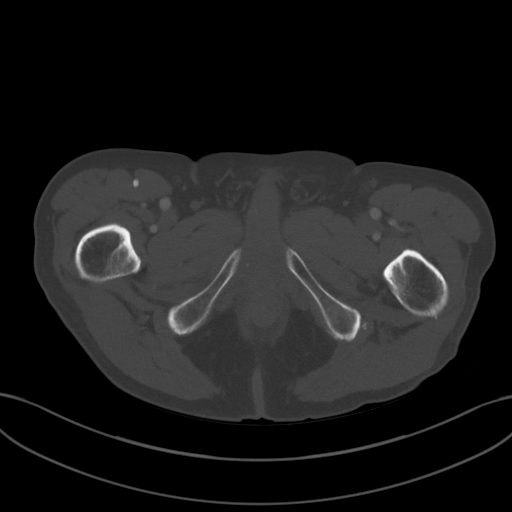
[im 14/98  soft-tissue]
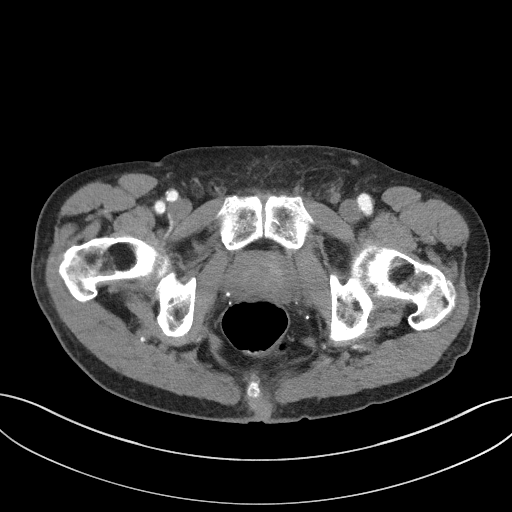
[im 21/98  soft-tissue]
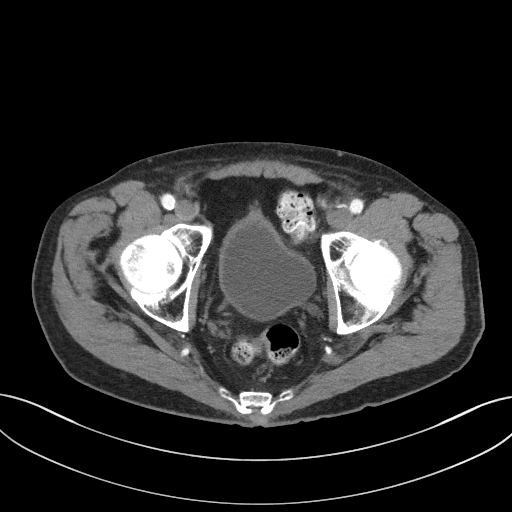
[im 28/98  soft-tissue]
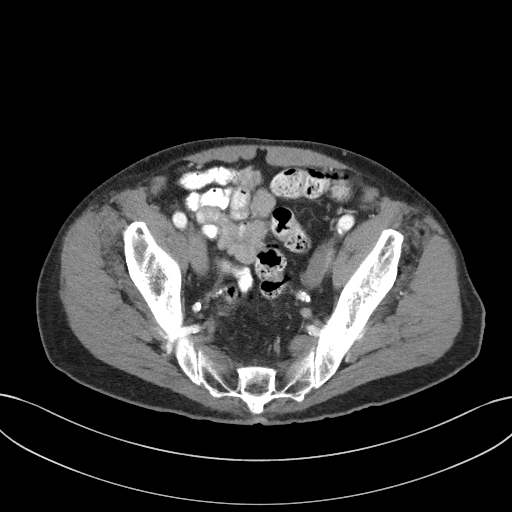
[im 35/98  soft-tissue]
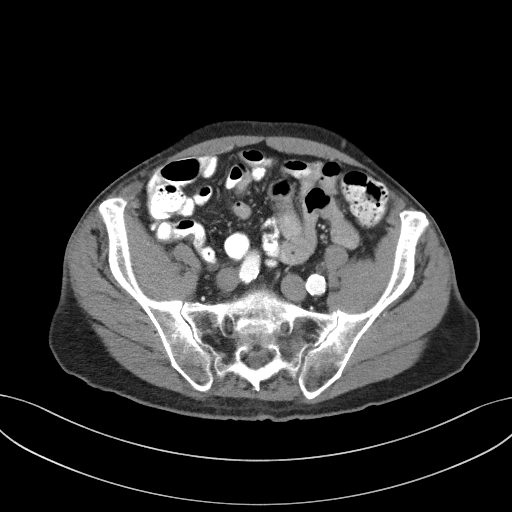
[im 42/98  soft-tissue]
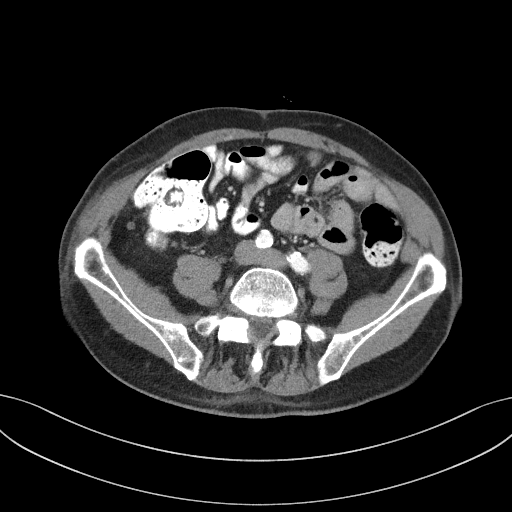
[im 56/98  soft-tissue]
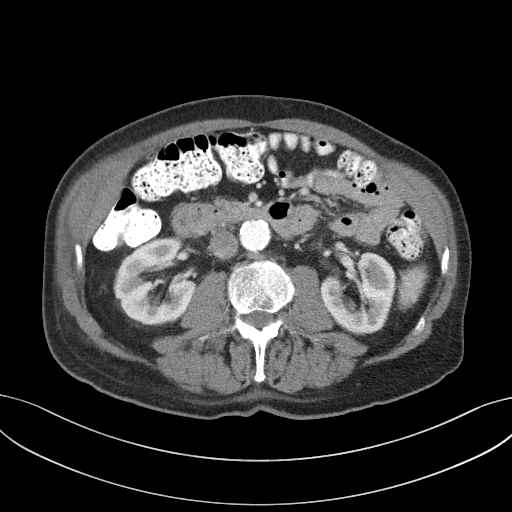
[im 63/98  soft-tissue]
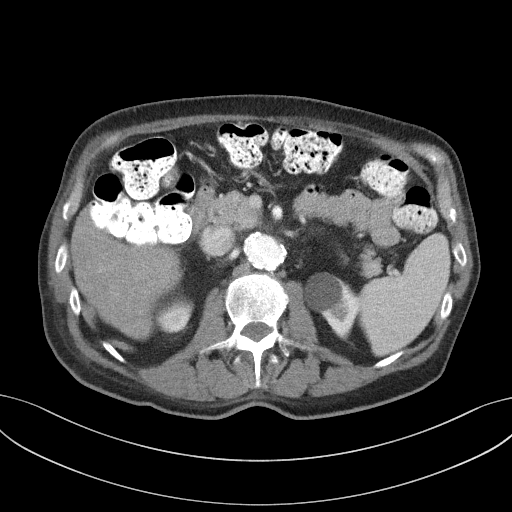
[im 70/98  soft-tissue]
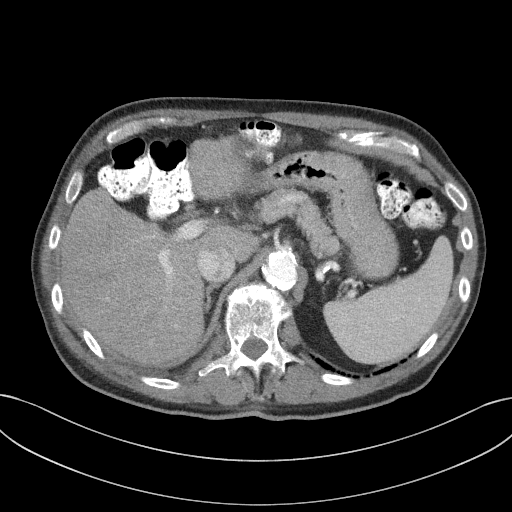
[im 70/98  bone]
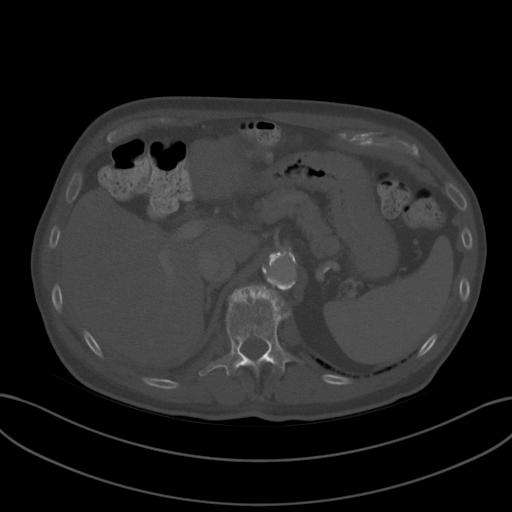
[im 77/98  soft-tissue]
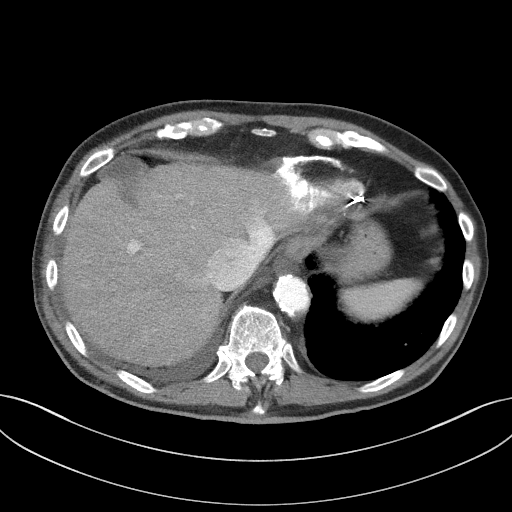
[im 84/98  soft-tissue]
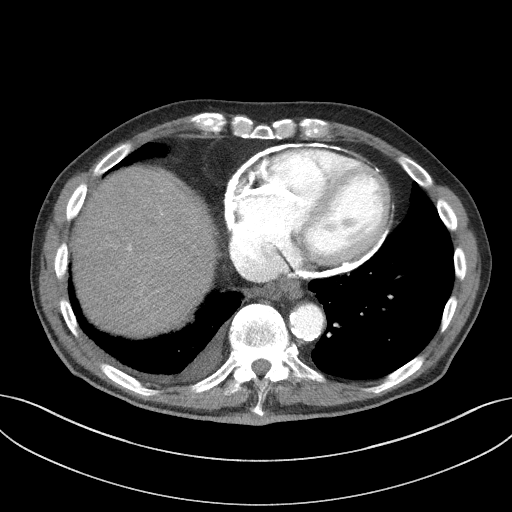
[im 91/98  soft-tissue]
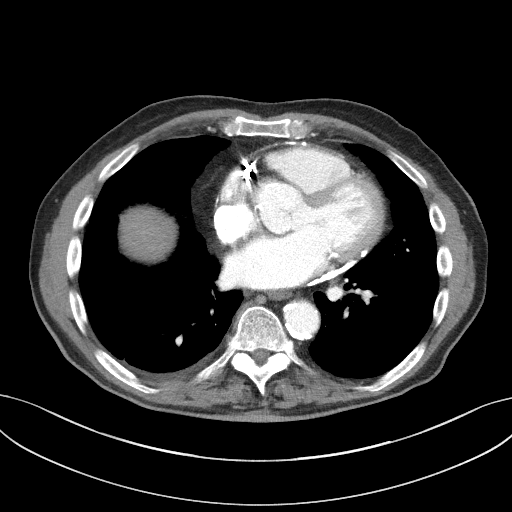

[Series 4: coronal st · coronal · 0.79mm/px · 3 of 87 slices shown]
[im 29/87  soft-tissue]
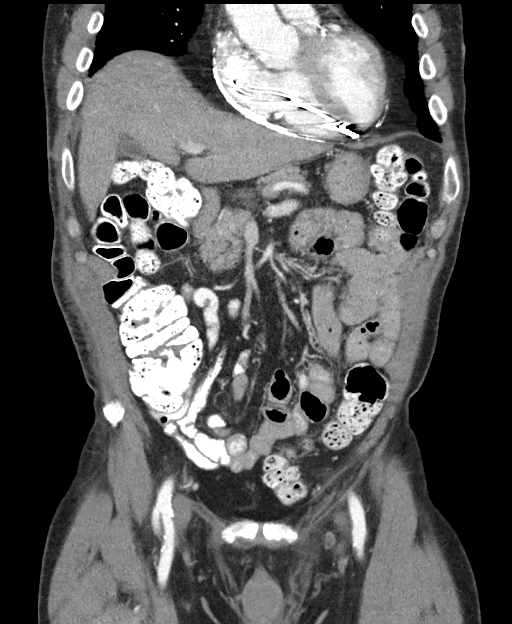
[im 39/87  soft-tissue]
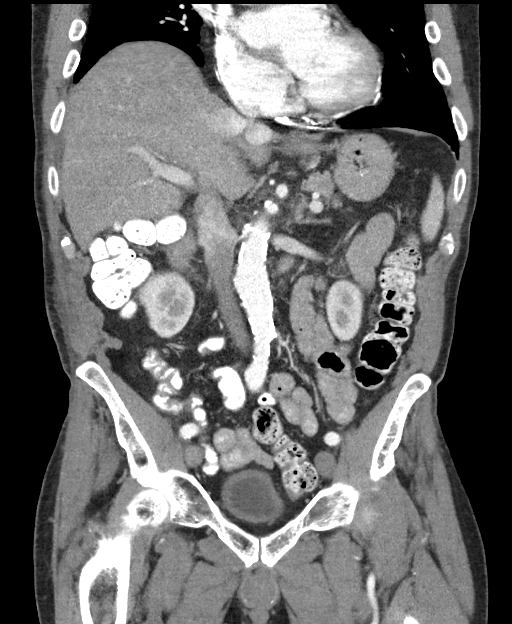
[im 48/87  soft-tissue]
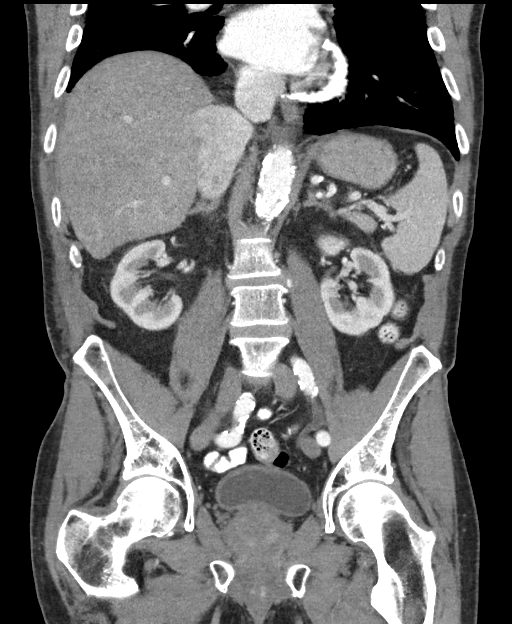

[15 of 46 positions shown; findings below may reference images not displayed]

FINDINGS: Lower chest: Small right pleural effusion. Ascending aortic aneurysm
4.3 cm in diameter, stable. Dense pericardial calcifications
compatible with prior calcific pericarditis, similar to prior.
Aortic, left anterior descending, and right coronary artery
atherosclerosis. Dual lead pacer in place. Postoperative findings at
the right lung base. Centrilobular emphysema. Reticular interstitial
accentuation at the lung bases peripherally. Left infrahilar lymph
node 0.9 cm in short axis on image 2 of series 2.

Hepatobiliary: Unremarkable

Pancreas: Unremarkable

Spleen: Unremarkable

Adrenals/Urinary Tract: Simple appearing left kidney upper pole
cyst. 2 mm left mid kidney nonobstructive renal calculus. Prostate
gland mildly indents the bladder base. Borderline distal left
hydroureter without obvious cause.

Stomach/Bowel: Unremarkable

Vascular/Lymphatic: Aortoiliac atherosclerotic vascular disease.

Reproductive: Mild prostatomegaly.

Other: No supplemental non-categorized findings.

Musculoskeletal: Stable 80% compression fracture at T11. Fatty left
spermatic cord.
IMPRESSION: 1. A specific cause for the patient's left lower quadrant abdominal
pain is not identified.
2. There is some borderline left distal hydroureter, but no cause is
identified.
3. Ascending aortic aneurysm 4.3 cm in diameter, stable. Recommend
annual imaging followup by CTA or MRA. This recommendation follows
[WM] ACCF/AHA/AATS/ACR/ASA/SCA/YO/YO/YO/YO Guidelines for the
Diagnosis and Management of Patients with Thoracic Aortic Disease.
Circulation. [WM]; 121: [id number]. Aortic aneurysm NOS ([WM]-[WM])
4. Small right pleural effusion.
5. Other imaging findings of potential clinical significance: Dense
pericardial calcifications compatible with prior calcific
pericarditis, similar to prior. 2 mm left mid kidney nonobstructive
renal calculus. Mild prostatomegaly. Stable 80% compression fracture
at T11. Fatty left spermatic cord.
6. Emphysema and aortic atherosclerosis.

Aortic Atherosclerosis ([WM]-[WM]) and Emphysema ([WM]-[WM]).

## 2020-05-16 MED ORDER — IOHEXOL 300 MG/ML  SOLN
100.0000 mL | Freq: Once | INTRAMUSCULAR | Status: AC | PRN
  Administered 2020-05-16: 100 mL via INTRAVENOUS

## 2020-05-16 NOTE — Progress Notes (Signed)
[facility] OFFICE PROGRESS NOTE  Alexander Battles, MD [place] [address]  DIAGNOSIS: Stage Ib (T2 a, N0, M0) small cell lung cancer presented with superior segment right lower lobe lung nodule. Diagnosed in August 2021  PRIOR THERAPY: status post right lower lobe superior segmentectomy with lymph node sampling on March 12, 2020 under the care of Dr. Servando Snare.  CURRENT THERAPY: Systemic chemotherapy with carboplatin for AUC of 5 on day 1, etoposide 100 mg/M2 on days 1, 2 and 3 with Neulasta support on day 5. First dose on 04/09/20. Status post 2 cycles.   INTERVAL HISTORY: Alexander Murray 79 y.o. male returns to the clinic for a follow up visit. The patient is feeling well today without any concerning complaints. He saw gastroenterology last week for left sided abdominal pain. He had a CT of the abdomena and pelvis which was unremarkable. He was given Levsin for his abdominal cramping/spasm. He still endorses some soreness, particularly in the RUQ but he is able to manage it. The patient continues to tolerate treatment with chemotherapy well without any concerning adverse side effects. The plan is to complete 4 cycles. Denies any fever, chills, or night sweats. He often gains and loses a couple of pounds. He notices that he is eating less. A member of the nutritionist team is scheduled to see him in the infusion room tomorrow. Denies any chest pain, cough, or hemoptysis. He states his breathing is "fine" and he notices that he sometimes has some dyspnea with exertion such as going up an incline. Has some cough which he attributes to allergies and phlegm. He takes Singulair and has been taking this for several years. Denies any nausea, vomiting, or diarrhea. He takes miralax if needed for nausea. Denies any headache or visual changes. Denies any rashes or skin changes. The patient is here today for evaluation prior to starting cycle # 3   MEDICAL HISTORY: Past Medical  History:  Diagnosis Date  . Allergy   . Arthritis    "right knee and right thumb" (12/19/2014)  . Asthma    pt denies  . Bronchitis 07/2016   started in December and continued about 2 months  . Esophageal stricture   . GERD (gastroesophageal reflux disease)   . Hemochromatosis   . Hiatal hernia   . Hypercalcemia   . Hypercholesteremia   . Hypertension   . Internal hemorrhoids   . Lung cancer ([facility])   . Osteoarthritis   . Palpitations   . Presence of permanent cardiac pacemaker    hx bradycardia  . Retinal artery occlusion, branch    "right eye"  . Second degree AV block, Mobitz type II   . Skin cancer    right shoulder  . Stroke [facility]) ~ 2011   "right eye"/ partial blindness  . Tubular adenoma of colon     ALLERGIES:  has No Known Allergies.  MEDICATIONS:  Current Outpatient Medications  Medication Sig Dispense Refill  . acetaminophen (TYLENOL) 325 MG tablet Take 2 tablets (650 mg total) by mouth every 6 (six) hours as needed for mild pain or moderate pain.    Marland Kitchen amLODipine (NORVASC) 2.5 MG tablet Take 2.5 mg by mouth at bedtime.    Marland Kitchen aspirin 81 MG tablet Take 81 mg by mouth daily.    . celecoxib (CELEBREX) 200 MG capsule Take 1 capsule (200 mg total) by mouth 2 (two) times daily as needed for mild pain. 10 capsule 0  . chlorthalidone (HYGROTON) 25  MG tablet Take 25 mg by mouth every other day.     . clopidogrel (PLAVIX) 75 MG tablet Take 75 mg by mouth every other day.    . doxazosin (CARDURA) 4 MG tablet Take 4 mg by mouth daily.     Marland Kitchen esomeprazole (NEXIUM) 20 MG packet Take 20 mg by mouth daily before breakfast.    . hyoscyamine (LEVSIN SL) 0.125 MG SL tablet Place 1 tablet (0.125 mg total) under the tongue every 8 (eight) hours as needed (abdominal pain). 20 tablet 1  . Misc Natural Products (GLUCOSAMINE CHONDROITIN ADV PO) Take 1,500 mg by mouth daily.    . montelukast (SINGULAIR) 10 MG tablet Take 10 mg by mouth every morning.     . Multiple Vitamin (MULTIVITAMIN)  capsule Take 1 capsule by mouth daily.    . Multiple Vitamins-Minerals (PRESERVISION AREDS 2 PO) Take 1 capsule by mouth daily.    . prochlorperazine (COMPAZINE) 10 MG tablet Take 1 tablet (10 mg total) by mouth every 6 (six) hours as needed for nausea or vomiting. 30 tablet 0  . simvastatin (ZOCOR) 40 MG tablet Take 40 mg by mouth daily.    . vitamin C (ASCORBIC ACID) 500 MG tablet Take 500 mg by mouth daily.      No current facility-administered medications for this visit.    SURGICAL HISTORY:  Past Surgical History:  Procedure Laterality Date  . CATARACT EXTRACTION, BILATERAL  01/2017  . COLONOSCOPY    . EP IMPLANTABLE DEVICE N/A 12/19/2014   Procedure: Pacemaker Implant;  Surgeon: Deboraha Sprang, MD;  Location: [facility];  Service: Cardiovascular;  Laterality: N/A;  . ESOPHAGOGASTRODUODENOSCOPY (EGD) WITH ESOPHAGEAL DILATION  X 3  . EYE SURGERY Bilateral 2017  . INGUINAL HERNIA REPAIR Left 1980's  . INSERT / REPLACE / REMOVE PACEMAKER  12/19/2014  . INTERCOSTAL NERVE BLOCK Right 03/11/2020   Procedure: INTERCOSTAL NERVE BLOCK;  Surgeon: Grace Isaac, MD;  Location: [facility];  Service: Thoracic;  Laterality: Right;  . JOINT REPLACEMENT Right 2016  . KNEE ARTHROSCOPY Right ~ 1982; ~ 1992  . MOHS SURGERY Right ~ 2014   "pre-melanoma scapula"  . POSTERIOR TIBIAL TENDON REPAIR Left 2012  . TONSILLECTOMY  1946  . TOTAL KNEE ARTHROPLASTY Right 05/20/2015   Procedure: RIGHT TOTAL KNEE ARTHROPLASTY;  Surgeon: Paralee Cancel, MD;  Location: [facility];  Service: Orthopedics;  Laterality: Right;  . UPPER GASTROINTESTINAL ENDOSCOPY    . VIDEO BRONCHOSCOPY N/A 03/11/2020   Procedure: VIDEO BRONCHOSCOPY;  Surgeon: Grace Isaac, MD;  Location: [facility];  Service: Thoracic;  Laterality: N/A;  . XI ROBOTIC ASSISTED THORACOSCOPY- SEGMENTECTOMY Right 03/11/2020   Procedure: XI ROBOTIC ASSISTED THORACOSCOPY-RIGHT SUPERIOR SEGMENTECTOMY WITH NODE SAMPLES;  Surgeon: Grace Isaac, MD;   Location: [facility];  Service: Thoracic;  Laterality: Right;    REVIEW OF SYSTEMS:   Review of Systems  Constitutional: Negative for appetite change, chills, fatigue, fever and unexpected weight change.  HENT: Negative for mouth sores, nosebleeds, sore throat and trouble swallowing.   Eyes: Negative for eye problems and icterus.  Respiratory: Positive for mild dyspnea on exertion. Negative for cough, hemoptysis, and wheezing.   Cardiovascular: Negative for chest pain and leg swelling.  Gastrointestinal: Positive for occasional constipation and RUQ soreness. Negative for diarrhea, nausea and vomiting.  Genitourinary: Negative for bladder incontinence, difficulty urinating, dysuria, frequency and hematuria.   Musculoskeletal: Negative for back pain, gait problem, neck pain and neck stiffness.  Skin: Negative for itching and  rash.  Neurological: Negative for dizziness, extremity weakness, gait problem, headaches, light-headedness and seizures.  Hematological: Negative for adenopathy. Does not bruise/bleed easily.  Psychiatric/Behavioral: Negative for confusion, depression and sleep disturbance. The patient is not nervous/anxious.     PHYSICAL EXAMINATION:  Blood pressure (!) 150/97, pulse 85, temperature (!) 95.5 F (35.3 C), temperature source Tympanic, resp. rate 18, height 5' 10"  (1.778 m), weight 163 lb 12.8 oz (74.3 kg), SpO2 100 %.  ECOG PERFORMANCE STATUS: 1 - Symptomatic but completely ambulatory  Physical Exam  Constitutional: Oriented to person, place, and time and well-developed, well-nourished, and in no distress.  HENT:  Head: Normocephalic and atraumatic.  Mouth/Throat: Oropharynx is clear and moist. No oropharyngeal exudate.  Eyes: Conjunctivae are normal. Right eye exhibits no discharge. Left eye exhibits no discharge. No scleral icterus.  Neck: Normal range of motion. Neck supple.  Cardiovascular: Normal rate, regular rhythm, normal heart sounds and intact distal pulses.    Pulmonary/Chest: Effort normal and breath sounds normal. No respiratory distress. No wheezes. No rales.  Abdominal: Soft. Mild soreness in RUQ. Bowel sounds are normal. Exhibits no distension and no mass.  Musculoskeletal: Normal range of motion. Exhibits no edema.  Lymphadenopathy:    No cervical adenopathy.  Neurological: Alert and oriented to person, place, and time. Exhibits normal muscle tone. Gait normal. Coordination normal.  Skin: Skin is warm and dry. No rash noted. Not diaphoretic. No erythema. No pallor.  Psychiatric: Mood, memory and judgment normal.  Vitals reviewed.  LABORATORY DATA: Lab Results  Component Value Date   WBC 9.0 05/20/2020   HGB 11.8 (L) 05/20/2020   HCT 34.8 (L) 05/20/2020   MCV 95.3 05/20/2020   PLT 151 05/20/2020      Chemistry      Component Value Date/Time   NA 140 05/20/2020 0829   K 3.8 05/20/2020 0829   CL 108 05/20/2020 0829   CO2 26 05/20/2020 0829   BUN 11 05/20/2020 0829   CREATININE 0.85 05/20/2020 0829      Component Value Date/Time   CALCIUM 10.4 (H) 05/20/2020 0829   ALKPHOS 256 (H) 05/20/2020 0829   AST 23 05/20/2020 0829   ALT 18 05/20/2020 0829   BILITOT 0.8 05/20/2020 0829       RADIOGRAPHIC STUDIES:  CT Abdomen Pelvis W Contrast  Result Date: 05/16/2020 CLINICAL DATA:  Left lower quadrant abdominal pain over the last 2.5 weeks. Constipation. Chemotherapy in progress for small cell lung cancer. Prior right lower lobectomy. EXAM: CT ABDOMEN AND PELVIS WITH CONTRAST TECHNIQUE: Multidetector CT imaging of the abdomen and pelvis was performed using the standard protocol following bolus administration of intravenous contrast. CONTRAST:  151m OMNIPAQUE IOHEXOL 300 MG/ML  SOLN COMPARISON:  PET-CT from 02/09/2020 FINDINGS: Lower chest: Small right pleural effusion. Ascending aortic aneurysm 4.3 cm in diameter, stable. Dense pericardial calcifications compatible with prior calcific pericarditis, similar to prior. Aortic, left  anterior descending, and right coronary artery atherosclerosis. Dual lead pacer in place. Postoperative findings at the right lung base. Centrilobular emphysema. Reticular interstitial accentuation at the lung bases peripherally. Left infrahilar lymph node 0.9 cm in short axis on image 2 of series 2. Hepatobiliary: Unremarkable Pancreas: Unremarkable Spleen: Unremarkable Adrenals/Urinary Tract: Simple appearing left kidney upper pole cyst. 2 mm left mid kidney nonobstructive renal calculus. Prostate gland mildly indents the bladder base. Borderline distal left hydroureter without obvious cause. Stomach/Bowel: Unremarkable Vascular/Lymphatic: Aortoiliac atherosclerotic vascular disease. Reproductive: Mild prostatomegaly. Other: No supplemental non-categorized findings. Musculoskeletal: Stable 80% compression fracture at T11.  Fatty left spermatic cord. IMPRESSION: 1. A specific cause for the patient's left lower quadrant abdominal pain is not identified. 2. There is some borderline left distal hydroureter, but no cause is identified. 3. Ascending aortic aneurysm 4.3 cm in diameter, stable. Recommend annual imaging followup by CTA or MRA. This recommendation follows 2010 ACCF/AHA/AATS/ACR/ASA/SCA/SCAI/SIR/STS/SVM Guidelines for the Diagnosis and Management of Patients with Thoracic Aortic Disease. Circulation. 2010; 121: U438-V818. Aortic aneurysm NOS (ICD10-I71.9) 4. Small right pleural effusion. 5. Other imaging findings of potential clinical significance: Dense pericardial calcifications compatible with prior calcific pericarditis, similar to prior. 2 mm left mid kidney nonobstructive renal calculus. Mild prostatomegaly. Stable 80% compression fracture at T11. Fatty left spermatic cord. 6. Emphysema and aortic atherosclerosis. Aortic Atherosclerosis (ICD10-I70.0) and Emphysema (ICD10-J43.9). Electronically Signed   By: Van Clines M.D.   On: 05/16/2020 08:51     ASSESSMENT/PLAN:  This is a very pleasant  79 year old Caucasian male recently diagnosed with a stage Ib (T2 a, N0, M0) small cell lung cancer presented with superior segment right lower lobe lung nodule status post right lower lobe superior segmentectomy with lymph node sampling on March 12, 2020 under the care of Dr. Servando Snare.  The plan is to undergo 4 cycles of adjuvant systemic chemotherapy with carboplatin for AUC of 5 on day 1, etoposide 100 mg/M2 on days 1, 2 and 3 with Neulasta support on day 5. He is status post 2 cycles.   Labs were reviewed. Recommend that he proceed with cycle #3 today as scheduled.   We will see her back for a follow up visit in 3 weeks for evaluation before starting cycle #4.   Regarding his abdominal soreness, we will continue to monitor his LFTs closely. His LFTs and bili are WNL except for alk phos which is a little elevated in isolation. We will continue to monitor weekly. Recent CT of abdomen and pelvis did not show any etiology of his pain. His pain is manageable. Of note, he did also have his lung surgery on the right side as well.   The patient was advised to call immediately if she has any concerning symptoms in the interval. The patient voices understanding of current disease status and treatment options and is in agreement with the current care plan. All questions were answered. The patient knows to call the clinic with any problems, questions or concerns. We can certainly see the patient much sooner if necessary       No orders of the defined types were placed in this encounter.    Talissa Apple L Carleta Woodrow, PA-C 05/20/20

## 2020-05-20 ENCOUNTER — Inpatient Hospital Stay: Payer: Medicare Other

## 2020-05-20 ENCOUNTER — Inpatient Hospital Stay (HOSPITAL_BASED_OUTPATIENT_CLINIC_OR_DEPARTMENT_OTHER): Payer: Medicare Other | Admitting: Physician Assistant

## 2020-05-20 ENCOUNTER — Other Ambulatory Visit: Payer: Self-pay

## 2020-05-20 ENCOUNTER — Encounter: Payer: Self-pay | Admitting: Physician Assistant

## 2020-05-20 VITALS — BP 150/97 | HR 85 | Temp 95.5°F | Resp 18 | Ht 70.0 in | Wt 163.8 lb

## 2020-05-20 DIAGNOSIS — M129 Arthropathy, unspecified: Secondary | ICD-10-CM | POA: Diagnosis not present

## 2020-05-20 DIAGNOSIS — K219 Gastro-esophageal reflux disease without esophagitis: Secondary | ICD-10-CM | POA: Diagnosis not present

## 2020-05-20 DIAGNOSIS — C3431 Malignant neoplasm of lower lobe, right bronchus or lung: Secondary | ICD-10-CM

## 2020-05-20 DIAGNOSIS — Z5111 Encounter for antineoplastic chemotherapy: Secondary | ICD-10-CM

## 2020-05-20 DIAGNOSIS — K222 Esophageal obstruction: Secondary | ICD-10-CM | POA: Diagnosis not present

## 2020-05-20 DIAGNOSIS — Z5189 Encounter for other specified aftercare: Secondary | ICD-10-CM | POA: Diagnosis not present

## 2020-05-20 LAB — CMP (CANCER CENTER ONLY)
ALT: 18 U/L (ref 0–44)
AST: 23 U/L (ref 15–41)
Albumin: 3.7 g/dL (ref 3.5–5.0)
Alkaline Phosphatase: 256 U/L — ABNORMAL HIGH (ref 38–126)
Anion gap: 6 (ref 5–15)
BUN: 11 mg/dL (ref 8–23)
CO2: 26 mmol/L (ref 22–32)
Calcium: 10.4 mg/dL — ABNORMAL HIGH (ref 8.9–10.3)
Chloride: 108 mmol/L (ref 98–111)
Creatinine: 0.85 mg/dL (ref 0.61–1.24)
GFR, Estimated: >60 mL/min (ref >60)
Glucose, Bld: 106 mg/dL — ABNORMAL HIGH (ref 70–99)
Potassium: 3.8 mmol/L (ref 3.5–5.1)
Sodium: 140 mmol/L (ref 135–145)
Total Bilirubin: 0.8 mg/dL (ref 0.3–1.2)
Total Protein: 6.8 g/dL (ref 6.5–8.1)

## 2020-05-20 LAB — CBC WITH DIFFERENTIAL (CANCER CENTER ONLY)
Abs Immature Granulocytes: 0.18 10*3/uL — ABNORMAL HIGH (ref 0.00–0.07)
Basophils Absolute: 0.1 10*3/uL (ref 0.0–0.1)
Basophils Relative: 1 %
Eosinophils Absolute: 0.1 10*3/uL (ref 0.0–0.5)
Eosinophils Relative: 1 %
HCT: 34.8 % — ABNORMAL LOW (ref 39.0–52.0)
Hemoglobin: 11.8 g/dL — ABNORMAL LOW (ref 13.0–17.0)
Immature Granulocytes: 2 %
Lymphocytes Relative: 13 %
Lymphs Abs: 1.1 10*3/uL (ref 0.7–4.0)
MCH: 32.3 pg (ref 26.0–34.0)
MCHC: 33.9 g/dL (ref 30.0–36.0)
MCV: 95.3 fL (ref 80.0–100.0)
Monocytes Absolute: 0.9 10*3/uL (ref 0.1–1.0)
Monocytes Relative: 10 %
Neutro Abs: 6.6 10*3/uL (ref 1.7–7.7)
Neutrophils Relative %: 73 %
Platelet Count: 151 10*3/uL (ref 150–400)
RBC: 3.65 MIL/uL — ABNORMAL LOW (ref 4.22–5.81)
RDW: 19.9 % — ABNORMAL HIGH (ref 11.5–15.5)
WBC Count: 9 10*3/uL (ref 4.0–10.5)
nRBC: 0 % (ref 0.0–0.2)

## 2020-05-20 MED ORDER — SODIUM CHLORIDE 0.9 % IV SOLN
Freq: Once | INTRAVENOUS | Status: AC
  Filled 2020-05-20: qty 250

## 2020-05-20 MED ORDER — SODIUM CHLORIDE 0.9 % IV SOLN
10.0000 mg | Freq: Once | INTRAVENOUS | Status: AC
  Administered 2020-05-20: 10 mg via INTRAVENOUS
  Filled 2020-05-20: qty 10

## 2020-05-20 MED ORDER — PALONOSETRON HCL INJECTION 0.25 MG/5ML
INTRAVENOUS | Status: AC
  Filled 2020-05-20: qty 5

## 2020-05-20 MED ORDER — SODIUM CHLORIDE 0.9 % IV SOLN
150.0000 mg | Freq: Once | INTRAVENOUS | Status: AC
  Administered 2020-05-20: 150 mg via INTRAVENOUS
  Filled 2020-05-20: qty 150

## 2020-05-20 MED ORDER — SODIUM CHLORIDE 0.9 % IV SOLN
427.5000 mg | Freq: Once | INTRAVENOUS | Status: AC
  Administered 2020-05-20: 430 mg via INTRAVENOUS
  Filled 2020-05-20: qty 43

## 2020-05-20 MED ORDER — SODIUM CHLORIDE 0.9 % IV SOLN
100.0000 mg/m2 | Freq: Once | INTRAVENOUS | Status: AC
  Administered 2020-05-20: 190 mg via INTRAVENOUS
  Filled 2020-05-20: qty 9.5

## 2020-05-20 MED ORDER — PALONOSETRON HCL INJECTION 0.25 MG/5ML
0.2500 mg | Freq: Once | INTRAVENOUS | Status: AC
  Administered 2020-05-20: 0.25 mg via INTRAVENOUS

## 2020-05-20 NOTE — Patient Instructions (Signed)
[facility] Discharge Instructions for Patients Receiving Chemotherapy  Today you received the following chemotherapy agents: Carboplatin/Etoposide.  To help prevent nausea and vomiting after your treatment, we encourage you to take your nausea medication as directed.   If you develop nausea and vomiting that is not controlled by your nausea medication, call the clinic.   BELOW ARE SYMPTOMS THAT SHOULD BE REPORTED IMMEDIATELY:  *FEVER GREATER THAN 100.5 F  *CHILLS WITH OR WITHOUT FEVER  NAUSEA AND VOMITING THAT IS NOT CONTROLLED WITH YOUR NAUSEA MEDICATION  *UNUSUAL SHORTNESS OF BREATH  *UNUSUAL BRUISING OR BLEEDING  TENDERNESS IN MOUTH AND THROAT WITH OR WITHOUT PRESENCE OF ULCERS  *URINARY PROBLEMS  *BOWEL PROBLEMS  UNUSUAL RASH Items with * indicate a potential emergency and should be followed up as soon as possible.  Feel free to call the clinic should you have any questions or concerns. The clinic phone number is [phone].  Please show the [facility] at check-in to the Emergency Department and triage nurse.

## 2020-05-21 ENCOUNTER — Other Ambulatory Visit: Payer: Medicare Other

## 2020-05-21 ENCOUNTER — Ambulatory Visit: Payer: Medicare Other | Admitting: Internal Medicine

## 2020-05-21 ENCOUNTER — Other Ambulatory Visit: Payer: Self-pay

## 2020-05-21 ENCOUNTER — Inpatient Hospital Stay: Payer: Medicare Other

## 2020-05-21 ENCOUNTER — Ambulatory Visit: Payer: Medicare Other

## 2020-05-21 VITALS — BP 151/84 | HR 92 | Temp 98.0°F | Resp 16

## 2020-05-21 DIAGNOSIS — K222 Esophageal obstruction: Secondary | ICD-10-CM | POA: Diagnosis not present

## 2020-05-21 DIAGNOSIS — K219 Gastro-esophageal reflux disease without esophagitis: Secondary | ICD-10-CM | POA: Diagnosis not present

## 2020-05-21 DIAGNOSIS — M129 Arthropathy, unspecified: Secondary | ICD-10-CM | POA: Diagnosis not present

## 2020-05-21 DIAGNOSIS — Z5189 Encounter for other specified aftercare: Secondary | ICD-10-CM | POA: Diagnosis not present

## 2020-05-21 DIAGNOSIS — C3431 Malignant neoplasm of lower lobe, right bronchus or lung: Secondary | ICD-10-CM | POA: Diagnosis not present

## 2020-05-21 DIAGNOSIS — Z5111 Encounter for antineoplastic chemotherapy: Secondary | ICD-10-CM | POA: Diagnosis not present

## 2020-05-21 MED ORDER — SODIUM CHLORIDE 0.9 % IV SOLN
10.0000 mg | Freq: Once | INTRAVENOUS | Status: AC
  Administered 2020-05-21: 10 mg via INTRAVENOUS
  Filled 2020-05-21: qty 10

## 2020-05-21 MED ORDER — SODIUM CHLORIDE 0.9 % IV SOLN
Freq: Once | INTRAVENOUS | Status: AC
  Filled 2020-05-21: qty 250

## 2020-05-21 MED ORDER — SODIUM CHLORIDE 0.9 % IV SOLN
100.0000 mg/m2 | Freq: Once | INTRAVENOUS | Status: AC
  Administered 2020-05-21: 190 mg via INTRAVENOUS
  Filled 2020-05-21: qty 9.5

## 2020-05-21 NOTE — Progress Notes (Signed)
Nutrition Assessment:  Patient identified on Malnutrition Screening report for weight loss  79 year old male with small cell lung cancer.  Receiving adjuvant chemotherapy.  Past medical history of esophageal stricture, stroke, HLD, HTN, GERD, hemochromatosis, pacemaker.    Met with patient during infusion.  Patient reports that he has a good appetite. Does not tend to eat as much as he use to.  Enjoys desserts.  Lives at [employer] [facility]  Eats a variety of foods including good sources of protein.  Reports that weight up and down is related to fluid gain or loss.  Takes a fluid pill every other day.  Denies nutrition impact symptoms.     Medications: reviewed  Labs: reviewed  Anthropometrics:   Height: 70 inches Weight: 163 lb 10/18 UBW: 160 BMI: 23   NUTRITION DIAGNOSIS: none at this time   INTERVENTION:  Encouraged intake of well balanced diet including good sources of protein.  Contact information provided    NEXT VISIT: no follow-up, patient to contact RD if needed in the future.   Alliah Boulanger B. Zenia Resides, [place], [place] Registered Dietitian [phone] (mobile)

## 2020-05-21 NOTE — Patient Instructions (Signed)
[facility] Discharge Instructions for Patients Receiving Chemotherapy  Today you received the following chemotherapy agents: Etoposide.   To help prevent nausea and vomiting after your treatment, we encourage you to take your nausea medication as directed.    If you develop nausea and vomiting that is not controlled by your nausea medication, call the clinic.   BELOW ARE SYMPTOMS THAT SHOULD BE REPORTED IMMEDIATELY:  *FEVER GREATER THAN 100.5 F  *CHILLS WITH OR WITHOUT FEVER  NAUSEA AND VOMITING THAT IS NOT CONTROLLED WITH YOUR NAUSEA MEDICATION  *UNUSUAL SHORTNESS OF BREATH  *UNUSUAL BRUISING OR BLEEDING  TENDERNESS IN MOUTH AND THROAT WITH OR WITHOUT PRESENCE OF ULCERS  *URINARY PROBLEMS  *BOWEL PROBLEMS  UNUSUAL RASH Items with * indicate a potential emergency and should be followed up as soon as possible.  Feel free to call the clinic should you have any questions or concerns. The clinic phone number is [phone].  Please show the [facility] at check-in to the Emergency Department and triage nurse.

## 2020-05-22 ENCOUNTER — Ambulatory Visit: Payer: Medicare Other

## 2020-05-22 ENCOUNTER — Inpatient Hospital Stay: Payer: Medicare Other

## 2020-05-22 ENCOUNTER — Other Ambulatory Visit: Payer: Self-pay

## 2020-05-22 VITALS — BP 112/88 | HR 88 | Temp 97.8°F | Resp 16

## 2020-05-22 DIAGNOSIS — K222 Esophageal obstruction: Secondary | ICD-10-CM | POA: Diagnosis not present

## 2020-05-22 DIAGNOSIS — Z5111 Encounter for antineoplastic chemotherapy: Secondary | ICD-10-CM | POA: Diagnosis not present

## 2020-05-22 DIAGNOSIS — C3431 Malignant neoplasm of lower lobe, right bronchus or lung: Secondary | ICD-10-CM

## 2020-05-22 DIAGNOSIS — K219 Gastro-esophageal reflux disease without esophagitis: Secondary | ICD-10-CM | POA: Diagnosis not present

## 2020-05-22 DIAGNOSIS — M129 Arthropathy, unspecified: Secondary | ICD-10-CM | POA: Diagnosis not present

## 2020-05-22 DIAGNOSIS — Z5189 Encounter for other specified aftercare: Secondary | ICD-10-CM | POA: Diagnosis not present

## 2020-05-22 MED ORDER — SODIUM CHLORIDE 0.9 % IV SOLN
Freq: Once | INTRAVENOUS | Status: AC
  Filled 2020-05-22: qty 250

## 2020-05-22 MED ORDER — SODIUM CHLORIDE 0.9 % IV SOLN
10.0000 mg | Freq: Once | INTRAVENOUS | Status: AC
  Administered 2020-05-22: 10 mg via INTRAVENOUS
  Filled 2020-05-22: qty 10

## 2020-05-22 MED ORDER — SODIUM CHLORIDE 0.9 % IV SOLN
100.0000 mg/m2 | Freq: Once | INTRAVENOUS | Status: AC
  Administered 2020-05-22: 190 mg via INTRAVENOUS
  Filled 2020-05-22: qty 9.5

## 2020-05-22 NOTE — Patient Instructions (Signed)
[facility] Discharge Instructions for Patients Receiving Chemotherapy  Today you received the following chemotherapy agents: Etoposide.   To help prevent nausea and vomiting after your treatment, we encourage you to take your nausea medication as directed.    If you develop nausea and vomiting that is not controlled by your nausea medication, call the clinic.   BELOW ARE SYMPTOMS THAT SHOULD BE REPORTED IMMEDIATELY:  *FEVER GREATER THAN 100.5 F  *CHILLS WITH OR WITHOUT FEVER  NAUSEA AND VOMITING THAT IS NOT CONTROLLED WITH YOUR NAUSEA MEDICATION  *UNUSUAL SHORTNESS OF BREATH  *UNUSUAL BRUISING OR BLEEDING  TENDERNESS IN MOUTH AND THROAT WITH OR WITHOUT PRESENCE OF ULCERS  *URINARY PROBLEMS  *BOWEL PROBLEMS  UNUSUAL RASH Items with * indicate a potential emergency and should be followed up as soon as possible.  Feel free to call the clinic should you have any questions or concerns. The clinic phone number is [phone].  Please show the [facility] at check-in to the Emergency Department and triage nurse.

## 2020-05-23 ENCOUNTER — Ambulatory Visit: Payer: Medicare Other

## 2020-05-24 ENCOUNTER — Inpatient Hospital Stay: Payer: Medicare Other

## 2020-05-24 ENCOUNTER — Other Ambulatory Visit: Payer: Self-pay

## 2020-05-24 VITALS — BP 139/85 | HR 90

## 2020-05-24 DIAGNOSIS — C3431 Malignant neoplasm of lower lobe, right bronchus or lung: Secondary | ICD-10-CM

## 2020-05-24 DIAGNOSIS — K219 Gastro-esophageal reflux disease without esophagitis: Secondary | ICD-10-CM | POA: Diagnosis not present

## 2020-05-24 DIAGNOSIS — Z5189 Encounter for other specified aftercare: Secondary | ICD-10-CM | POA: Diagnosis not present

## 2020-05-24 DIAGNOSIS — M129 Arthropathy, unspecified: Secondary | ICD-10-CM | POA: Diagnosis not present

## 2020-05-24 DIAGNOSIS — K222 Esophageal obstruction: Secondary | ICD-10-CM | POA: Diagnosis not present

## 2020-05-24 DIAGNOSIS — Z5111 Encounter for antineoplastic chemotherapy: Secondary | ICD-10-CM | POA: Diagnosis not present

## 2020-05-24 MED ORDER — PEGFILGRASTIM-JMDB 6 MG/0.6ML ~~LOC~~ SOSY
6.0000 mg | PREFILLED_SYRINGE | Freq: Once | SUBCUTANEOUS | Status: AC
  Administered 2020-05-24: 6 mg via SUBCUTANEOUS

## 2020-05-24 MED ORDER — PEGFILGRASTIM-JMDB 6 MG/0.6ML ~~LOC~~ SOSY
PREFILLED_SYRINGE | SUBCUTANEOUS | Status: AC
  Filled 2020-05-24: qty 0.6

## 2020-05-27 ENCOUNTER — Inpatient Hospital Stay: Payer: Medicare Other

## 2020-05-27 ENCOUNTER — Other Ambulatory Visit: Payer: Self-pay

## 2020-05-27 DIAGNOSIS — K219 Gastro-esophageal reflux disease without esophagitis: Secondary | ICD-10-CM | POA: Diagnosis not present

## 2020-05-27 DIAGNOSIS — C3431 Malignant neoplasm of lower lobe, right bronchus or lung: Secondary | ICD-10-CM

## 2020-05-27 DIAGNOSIS — K222 Esophageal obstruction: Secondary | ICD-10-CM | POA: Diagnosis not present

## 2020-05-27 DIAGNOSIS — Z5111 Encounter for antineoplastic chemotherapy: Secondary | ICD-10-CM | POA: Diagnosis not present

## 2020-05-27 DIAGNOSIS — Z5189 Encounter for other specified aftercare: Secondary | ICD-10-CM | POA: Diagnosis not present

## 2020-05-27 DIAGNOSIS — M129 Arthropathy, unspecified: Secondary | ICD-10-CM | POA: Diagnosis not present

## 2020-05-27 LAB — CBC WITH DIFFERENTIAL (CANCER CENTER ONLY)
Abs Immature Granulocytes: 0.4 10*3/uL — ABNORMAL HIGH (ref 0.00–0.07)
Band Neutrophils: 11 %
Basophils Absolute: 0 10*3/uL (ref 0.0–0.1)
Basophils Relative: 0 %
Eosinophils Absolute: 0.2 10*3/uL (ref 0.0–0.5)
Eosinophils Relative: 1 %
HCT: 29.5 % — ABNORMAL LOW (ref 39.0–52.0)
Hemoglobin: 10 g/dL — ABNORMAL LOW (ref 13.0–17.0)
Lymphocytes Relative: 4 %
Lymphs Abs: 0.9 10*3/uL (ref 0.7–4.0)
MCH: 32.8 pg (ref 26.0–34.0)
MCHC: 33.9 g/dL (ref 30.0–36.0)
MCV: 96.7 fL (ref 80.0–100.0)
Metamyelocytes Relative: 1 %
Monocytes Absolute: 0.2 10*3/uL (ref 0.1–1.0)
Monocytes Relative: 1 %
Myelocytes: 1 %
Neutro Abs: 19.9 10*3/uL — ABNORMAL HIGH (ref 1.7–7.7)
Neutrophils Relative %: 81 %
Platelet Count: 105 10*3/uL — ABNORMAL LOW (ref 150–400)
RBC: 3.05 MIL/uL — ABNORMAL LOW (ref 4.22–5.81)
RDW: 19.5 % — ABNORMAL HIGH (ref 11.5–15.5)
WBC Count: 21.6 10*3/uL — ABNORMAL HIGH (ref 4.0–10.5)
nRBC: 0 % (ref 0.0–0.2)

## 2020-05-27 LAB — CMP (CANCER CENTER ONLY)
ALT: 21 U/L (ref 0–44)
AST: 22 U/L (ref 15–41)
Albumin: 3.6 g/dL (ref 3.5–5.0)
Alkaline Phosphatase: 272 U/L — ABNORMAL HIGH (ref 38–126)
Anion gap: 2 — ABNORMAL LOW (ref 5–15)
BUN: 16 mg/dL (ref 8–23)
CO2: 29 mmol/L (ref 22–32)
Calcium: 10.1 mg/dL (ref 8.9–10.3)
Chloride: 103 mmol/L (ref 98–111)
Creatinine: 0.81 mg/dL (ref 0.61–1.24)
GFR, Estimated: >60 mL/min (ref >60)
Glucose, Bld: 118 mg/dL — ABNORMAL HIGH (ref 70–99)
Potassium: 3.8 mmol/L (ref 3.5–5.1)
Sodium: 134 mmol/L — ABNORMAL LOW (ref 135–145)
Total Bilirubin: 1.7 mg/dL — ABNORMAL HIGH (ref 0.3–1.2)
Total Protein: 6.1 g/dL — ABNORMAL LOW (ref 6.5–8.1)

## 2020-05-28 ENCOUNTER — Ambulatory Visit (INDEPENDENT_AMBULATORY_CARE_PROVIDER_SITE_OTHER): Payer: Medicare Other

## 2020-05-28 DIAGNOSIS — I441 Atrioventricular block, second degree: Secondary | ICD-10-CM

## 2020-05-28 LAB — CUP PACEART REMOTE DEVICE CHECK
Battery Remaining Longevity: 40 mo
Battery Voltage: 2.97 V
Brady Statistic AP VP Percent: 43.01 %
Brady Statistic AP VS Percent: 0 %
Brady Statistic AS VP Percent: 56.96 %
Brady Statistic AS VS Percent: 0.03 %
Brady Statistic RA Percent Paced: 42.45 %
Brady Statistic RV Percent Paced: 99.96 %
Date Time Interrogation Session: 20211026080008
Implantable Lead Implant Date: 20160518
Implantable Lead Implant Date: 20160518
Implantable Lead Location: 753859
Implantable Lead Location: 753860
Implantable Lead Model: 5076
Implantable Lead Model: 5076
Implantable Pulse Generator Implant Date: 20160518
Lead Channel Impedance Value: 323 Ohm
Lead Channel Impedance Value: 361 Ohm
Lead Channel Impedance Value: 418 Ohm
Lead Channel Impedance Value: 456 Ohm
Lead Channel Pacing Threshold Amplitude: 0.625 V
Lead Channel Pacing Threshold Amplitude: 0.625 V
Lead Channel Pacing Threshold Pulse Width: 0.4 ms
Lead Channel Pacing Threshold Pulse Width: 0.4 ms
Lead Channel Sensing Intrinsic Amplitude: 3.375 mV
Lead Channel Sensing Intrinsic Amplitude: 3.375 mV
Lead Channel Sensing Intrinsic Amplitude: 4.875 mV
Lead Channel Sensing Intrinsic Amplitude: 4.875 mV
Lead Channel Setting Pacing Amplitude: 1.5 V
Lead Channel Setting Pacing Amplitude: 2.5 V
Lead Channel Setting Pacing Pulse Width: 0.4 ms
Lead Channel Setting Sensing Sensitivity: 4 mV

## 2020-06-03 ENCOUNTER — Other Ambulatory Visit: Payer: Self-pay

## 2020-06-03 ENCOUNTER — Inpatient Hospital Stay: Payer: Medicare Other | Attending: Physician Assistant

## 2020-06-03 DIAGNOSIS — Z85828 Personal history of other malignant neoplasm of skin: Secondary | ICD-10-CM | POA: Insufficient documentation

## 2020-06-03 DIAGNOSIS — K449 Diaphragmatic hernia without obstruction or gangrene: Secondary | ICD-10-CM | POA: Diagnosis not present

## 2020-06-03 DIAGNOSIS — M1990 Unspecified osteoarthritis, unspecified site: Secondary | ICD-10-CM | POA: Insufficient documentation

## 2020-06-03 DIAGNOSIS — Z79899 Other long term (current) drug therapy: Secondary | ICD-10-CM | POA: Diagnosis not present

## 2020-06-03 DIAGNOSIS — I10 Essential (primary) hypertension: Secondary | ICD-10-CM | POA: Diagnosis not present

## 2020-06-03 DIAGNOSIS — C3431 Malignant neoplasm of lower lobe, right bronchus or lung: Secondary | ICD-10-CM | POA: Diagnosis not present

## 2020-06-03 DIAGNOSIS — Z5189 Encounter for other specified aftercare: Secondary | ICD-10-CM | POA: Insufficient documentation

## 2020-06-03 DIAGNOSIS — Z5111 Encounter for antineoplastic chemotherapy: Secondary | ICD-10-CM | POA: Diagnosis not present

## 2020-06-03 DIAGNOSIS — K219 Gastro-esophageal reflux disease without esophagitis: Secondary | ICD-10-CM | POA: Insufficient documentation

## 2020-06-03 DIAGNOSIS — E7800 Pure hypercholesterolemia, unspecified: Secondary | ICD-10-CM | POA: Diagnosis not present

## 2020-06-03 DIAGNOSIS — Z8673 Personal history of transient ischemic attack (TIA), and cerebral infarction without residual deficits: Secondary | ICD-10-CM | POA: Insufficient documentation

## 2020-06-03 LAB — CBC WITH DIFFERENTIAL (CANCER CENTER ONLY)
Abs Immature Granulocytes: 0.65 10*3/uL — ABNORMAL HIGH (ref 0.00–0.07)
Basophils Absolute: 0.2 10*3/uL — ABNORMAL HIGH (ref 0.0–0.1)
Basophils Relative: 2 %
Eosinophils Absolute: 0.1 10*3/uL (ref 0.0–0.5)
Eosinophils Relative: 1 %
HCT: 32.8 % — ABNORMAL LOW (ref 39.0–52.0)
Hemoglobin: 10.7 g/dL — ABNORMAL LOW (ref 13.0–17.0)
Immature Granulocytes: 6 %
Lymphocytes Relative: 11 %
Lymphs Abs: 1.1 10*3/uL (ref 0.7–4.0)
MCH: 31.5 pg (ref 26.0–34.0)
MCHC: 32.6 g/dL (ref 30.0–36.0)
MCV: 96.5 fL (ref 80.0–100.0)
Monocytes Absolute: 0.8 10*3/uL (ref 0.1–1.0)
Monocytes Relative: 8 %
Neutro Abs: 7.3 10*3/uL (ref 1.7–7.7)
Neutrophils Relative %: 72 %
Platelet Count: 56 10*3/uL — ABNORMAL LOW (ref 150–400)
RBC: 3.4 MIL/uL — ABNORMAL LOW (ref 4.22–5.81)
RDW: 20.9 % — ABNORMAL HIGH (ref 11.5–15.5)
WBC Count: 10.1 10*3/uL (ref 4.0–10.5)
nRBC: 0 % (ref 0.0–0.2)

## 2020-06-03 LAB — CMP (CANCER CENTER ONLY)
ALT: 24 U/L (ref 0–44)
AST: 27 U/L (ref 15–41)
Albumin: 3.7 g/dL (ref 3.5–5.0)
Alkaline Phosphatase: 305 U/L — ABNORMAL HIGH (ref 38–126)
Anion gap: 5 (ref 5–15)
BUN: 9 mg/dL (ref 8–23)
CO2: 28 mmol/L (ref 22–32)
Calcium: 10.2 mg/dL (ref 8.9–10.3)
Chloride: 107 mmol/L (ref 98–111)
Creatinine: 0.88 mg/dL (ref 0.61–1.24)
GFR, Estimated: >60 mL/min (ref >60)
Glucose, Bld: 118 mg/dL — ABNORMAL HIGH (ref 70–99)
Potassium: 3.8 mmol/L (ref 3.5–5.1)
Sodium: 140 mmol/L (ref 135–145)
Total Bilirubin: 0.8 mg/dL (ref 0.3–1.2)
Total Protein: 6.4 g/dL — ABNORMAL LOW (ref 6.5–8.1)

## 2020-06-03 NOTE — Progress Notes (Signed)
Remote pacemaker transmission.   

## 2020-06-05 ENCOUNTER — Encounter: Payer: Medicare Other | Admitting: Internal Medicine

## 2020-06-06 ENCOUNTER — Telehealth: Payer: Self-pay

## 2020-06-06 NOTE — Telephone Encounter (Signed)
Message Received: "Alexander Murray just called and wanted to inform us of a rash that has formed on his forearm on one of his veins that has been used for his chemo.(05/22/2020) (right FA) Patient said that it is tender and red and is firm as well. He wanted to make sure that this was not going to cause any problems with his upcoming treatment."  Per Dr. Julien Nordmann: "He can apply hydrocortisone cream to that area but we will evaluate him before his next treatment."  Schedule message sent for pt to be seen prior to next tx on 06/11/20. All of this information has been relayed to the pt and he expressed understanding of this information.

## 2020-06-11 ENCOUNTER — Inpatient Hospital Stay (HOSPITAL_BASED_OUTPATIENT_CLINIC_OR_DEPARTMENT_OTHER): Payer: Medicare Other | Admitting: Internal Medicine

## 2020-06-11 ENCOUNTER — Inpatient Hospital Stay: Payer: Medicare Other

## 2020-06-11 ENCOUNTER — Encounter: Payer: Self-pay | Admitting: Internal Medicine

## 2020-06-11 ENCOUNTER — Other Ambulatory Visit: Payer: Self-pay

## 2020-06-11 ENCOUNTER — Other Ambulatory Visit: Payer: Self-pay | Admitting: Internal Medicine

## 2020-06-11 VITALS — BP 130/79 | HR 81 | Temp 96.6°F | Resp 16 | Ht 70.0 in | Wt 171.0 lb

## 2020-06-11 DIAGNOSIS — C3490 Malignant neoplasm of unspecified part of unspecified bronchus or lung: Secondary | ICD-10-CM | POA: Diagnosis not present

## 2020-06-11 DIAGNOSIS — Z5189 Encounter for other specified aftercare: Secondary | ICD-10-CM | POA: Diagnosis not present

## 2020-06-11 DIAGNOSIS — Z5111 Encounter for antineoplastic chemotherapy: Secondary | ICD-10-CM

## 2020-06-11 DIAGNOSIS — C3431 Malignant neoplasm of lower lobe, right bronchus or lung: Secondary | ICD-10-CM

## 2020-06-11 DIAGNOSIS — K219 Gastro-esophageal reflux disease without esophagitis: Secondary | ICD-10-CM | POA: Diagnosis not present

## 2020-06-11 DIAGNOSIS — K449 Diaphragmatic hernia without obstruction or gangrene: Secondary | ICD-10-CM | POA: Diagnosis not present

## 2020-06-11 LAB — CMP (CANCER CENTER ONLY)
ALT: 30 U/L (ref 0–44)
AST: 35 U/L (ref 15–41)
Albumin: 3.7 g/dL (ref 3.5–5.0)
Alkaline Phosphatase: 302 U/L — ABNORMAL HIGH (ref 38–126)
Anion gap: 7 (ref 5–15)
BUN: 14 mg/dL (ref 8–23)
CO2: 24 mmol/L (ref 22–32)
Calcium: 10.1 mg/dL (ref 8.9–10.3)
Chloride: 106 mmol/L (ref 98–111)
Creatinine: 0.87 mg/dL (ref 0.61–1.24)
GFR, Estimated: >60 mL/min (ref >60)
Glucose, Bld: 122 mg/dL — ABNORMAL HIGH (ref 70–99)
Potassium: 3.8 mmol/L (ref 3.5–5.1)
Sodium: 137 mmol/L (ref 135–145)
Total Bilirubin: 1 mg/dL (ref 0.3–1.2)
Total Protein: 6.5 g/dL (ref 6.5–8.1)

## 2020-06-11 LAB — CBC WITH DIFFERENTIAL (CANCER CENTER ONLY)
Abs Immature Granulocytes: 0.07 10*3/uL (ref 0.00–0.07)
Basophils Absolute: 0.1 10*3/uL (ref 0.0–0.1)
Basophils Relative: 1 %
Eosinophils Absolute: 0.1 10*3/uL (ref 0.0–0.5)
Eosinophils Relative: 1 %
HCT: 34.7 % — ABNORMAL LOW (ref 39.0–52.0)
Hemoglobin: 11.8 g/dL — ABNORMAL LOW (ref 13.0–17.0)
Immature Granulocytes: 1 %
Lymphocytes Relative: 14 %
Lymphs Abs: 1 10*3/uL (ref 0.7–4.0)
MCH: 32.3 pg (ref 26.0–34.0)
MCHC: 34 g/dL (ref 30.0–36.0)
MCV: 95.1 fL (ref 80.0–100.0)
Monocytes Absolute: 0.9 10*3/uL (ref 0.1–1.0)
Monocytes Relative: 12 %
Neutro Abs: 5.1 10*3/uL (ref 1.7–7.7)
Neutrophils Relative %: 71 %
Platelet Count: 144 10*3/uL — ABNORMAL LOW (ref 150–400)
RBC: 3.65 MIL/uL — ABNORMAL LOW (ref 4.22–5.81)
RDW: 20.3 % — ABNORMAL HIGH (ref 11.5–15.5)
WBC Count: 7.3 10*3/uL (ref 4.0–10.5)
nRBC: 0 % (ref 0.0–0.2)

## 2020-06-11 MED ORDER — SODIUM CHLORIDE 0.9 % IV SOLN
100.0000 mg/m2 | Freq: Once | INTRAVENOUS | Status: AC
  Administered 2020-06-11: 190 mg via INTRAVENOUS
  Filled 2020-06-11: qty 9.5

## 2020-06-11 MED ORDER — PALONOSETRON HCL INJECTION 0.25 MG/5ML
INTRAVENOUS | Status: AC
  Filled 2020-06-11: qty 5

## 2020-06-11 MED ORDER — SODIUM CHLORIDE 0.9 % IV SOLN
150.0000 mg | Freq: Once | INTRAVENOUS | Status: AC
  Administered 2020-06-11: 150 mg via INTRAVENOUS
  Filled 2020-06-11: qty 150

## 2020-06-11 MED ORDER — SODIUM CHLORIDE 0.9 % IV SOLN
430.0000 mg | Freq: Once | INTRAVENOUS | Status: AC
  Administered 2020-06-11: 430 mg via INTRAVENOUS
  Filled 2020-06-11: qty 43

## 2020-06-11 MED ORDER — SODIUM CHLORIDE 0.9 % IV SOLN
Freq: Once | INTRAVENOUS | Status: AC
  Filled 2020-06-11: qty 250

## 2020-06-11 MED ORDER — SODIUM CHLORIDE 0.9 % IV SOLN
10.0000 mg | Freq: Once | INTRAVENOUS | Status: AC
  Administered 2020-06-11: 10 mg via INTRAVENOUS
  Filled 2020-06-11: qty 10

## 2020-06-11 MED ORDER — PALONOSETRON HCL INJECTION 0.25 MG/5ML
0.2500 mg | Freq: Once | INTRAVENOUS | Status: AC
  Administered 2020-06-11: 0.25 mg via INTRAVENOUS

## 2020-06-11 NOTE — Patient Instructions (Signed)
[facility] Discharge Instructions for Patients Receiving Chemotherapy  Today you received the following chemotherapy agents:Carboplatin and Etoposide  To help prevent nausea and vomiting after your treatment, we encourage you to take your nausea medication as directed by your MD.   If you develop nausea and vomiting that is not controlled by your nausea medication, call the clinic.   BELOW ARE SYMPTOMS THAT SHOULD BE REPORTED IMMEDIATELY:  *FEVER GREATER THAN 100.5 F  *CHILLS WITH OR WITHOUT FEVER  NAUSEA AND VOMITING THAT IS NOT CONTROLLED WITH YOUR NAUSEA MEDICATION  *UNUSUAL SHORTNESS OF BREATH  *UNUSUAL BRUISING OR BLEEDING  TENDERNESS IN MOUTH AND THROAT WITH OR WITHOUT PRESENCE OF ULCERS  *URINARY PROBLEMS  *BOWEL PROBLEMS  UNUSUAL RASH Items with * indicate a potential emergency and should be followed up as soon as possible.  Feel free to call the clinic should you have any questions or concerns. The clinic phone number is [phone].  Please show the [facility] at check-in to the Emergency Department and triage nurse.

## 2020-06-11 NOTE — Progress Notes (Signed)
[facility] [phone]) [phone]   Fax:(336) [phone]  OFFICE PROGRESS NOTE  [name], MD [place] [address]  DIAGNOSIS: Stage IB (T2 a, N0, M0) small cell lung cancer presented with superior segment right lower lobe lung nodule.  PRIOR THERAPY: status post right lower lobe superior segmentectomy with lymph node sampling on March 12, 2020 under the care of Dr. Servando Snare.  CURRENT THERAPY: Systemic chemotherapy with carboplatin for AUC of 5 on day 1 and etoposide 100 mg/M2 on days 1, 2 and 3 with Neulasta support every 3 weeks.  Status post 3 cycles.  INTERVAL HISTORY: Alexander Murray 79 y.o. male returns to the clinic today for follow-up visit.  The patient is feeling fine today with no concerning complaints except for fatigue.  He denied having any chest pain, shortness of breath, cough or hemoptysis.  He denied having any fever or chills.  He has no nausea, vomiting, diarrhea or constipation.  He denied having any headache or visual changes.  He has no recent weight loss or night sweats.  He is here today for evaluation before starting cycle #4 of his treatment.  MEDICAL HISTORY: Past Medical History:  Diagnosis Date  . Allergy   . Arthritis    "right knee and right thumb" (12/19/2014)  . Asthma    pt denies  . Bronchitis 07/2016   started in December and continued about 2 months  . Esophageal stricture   . GERD (gastroesophageal reflux disease)   . Hemochromatosis   . Hiatal hernia   . Hypercalcemia   . Hypercholesteremia   . Hypertension   . Internal hemorrhoids   . Lung cancer ([place])   . Osteoarthritis   . Palpitations   . Presence of permanent cardiac pacemaker    hx bradycardia  . Retinal artery occlusion, branch    "right eye"  . Second degree AV block, Mobitz type II   . Skin cancer    right shoulder  . Stroke [facility]) ~ 2011   "right eye"/ partial blindness  . Tubular adenoma of colon     ALLERGIES:  has No  Known Allergies.  MEDICATIONS:  Current Outpatient Medications  Medication Sig Dispense Refill  . acetaminophen (TYLENOL) 325 MG tablet Take 2 tablets (650 mg total) by mouth every 6 (six) hours as needed for mild pain or moderate pain.    Marland Kitchen amLODipine (NORVASC) 2.5 MG tablet Take 2.5 mg by mouth at bedtime.    Marland Kitchen aspirin 81 MG tablet Take 81 mg by mouth daily.    . celecoxib (CELEBREX) 200 MG capsule Take 1 capsule (200 mg total) by mouth 2 (two) times daily as needed for mild pain. 10 capsule 0  . chlorthalidone (HYGROTON) 25 MG tablet Take 25 mg by mouth every other day.     . clopidogrel (PLAVIX) 75 MG tablet Take 75 mg by mouth every other day.    . doxazosin (CARDURA) 4 MG tablet Take 4 mg by mouth daily.     Marland Kitchen esomeprazole (NEXIUM) 20 MG packet Take 20 mg by mouth daily before breakfast.    . hyoscyamine (LEVSIN SL) 0.125 MG SL tablet Place 1 tablet (0.125 mg total) under the tongue every 8 (eight) hours as needed (abdominal pain). 20 tablet 1  . Misc Natural Products (GLUCOSAMINE CHONDROITIN ADV PO) Take 1,500 mg by mouth daily.    . montelukast (SINGULAIR) 10 MG tablet Take 10 mg by mouth every morning.     Marland Kitchen  Multiple Vitamin (MULTIVITAMIN) capsule Take 1 capsule by mouth daily.    . Multiple Vitamins-Minerals (PRESERVISION AREDS 2 PO) Take 1 capsule by mouth daily.    . prochlorperazine (COMPAZINE) 10 MG tablet Take 1 tablet (10 mg total) by mouth every 6 (six) hours as needed for nausea or vomiting. 30 tablet 0  . simvastatin (ZOCOR) 40 MG tablet Take 40 mg by mouth daily.    . vitamin C (ASCORBIC ACID) 500 MG tablet Take 500 mg by mouth daily.      No current facility-administered medications for this visit.    SURGICAL HISTORY:  Past Surgical History:  Procedure Laterality Date  . CATARACT EXTRACTION, BILATERAL  01/2017  . COLONOSCOPY    . EP IMPLANTABLE DEVICE N/A 12/19/2014   Procedure: Pacemaker Implant;  Surgeon: Deboraha Sprang, MD;  Location: [facility];   Service: Cardiovascular;  Laterality: N/A;  . ESOPHAGOGASTRODUODENOSCOPY (EGD) WITH ESOPHAGEAL DILATION  X 3  . EYE SURGERY Bilateral 2017  . INGUINAL HERNIA REPAIR Left 1980's  . INSERT / REPLACE / REMOVE PACEMAKER  12/19/2014  . INTERCOSTAL NERVE BLOCK Right 03/11/2020   Procedure: INTERCOSTAL NERVE BLOCK;  Surgeon: Grace Isaac, MD;  Location: [facility];  Service: Thoracic;  Laterality: Right;  . JOINT REPLACEMENT Right 2016  . KNEE ARTHROSCOPY Right ~ 1982; ~ 1992  . MOHS SURGERY Right ~ 2014   "pre-melanoma scapula"  . POSTERIOR TIBIAL TENDON REPAIR Left 2012  . TONSILLECTOMY  1946  . TOTAL KNEE ARTHROPLASTY Right 05/20/2015   Procedure: RIGHT TOTAL KNEE ARTHROPLASTY;  Surgeon: Paralee Cancel, MD;  Location: [facility];  Service: Orthopedics;  Laterality: Right;  . UPPER GASTROINTESTINAL ENDOSCOPY    . VIDEO BRONCHOSCOPY N/A 03/11/2020   Procedure: VIDEO BRONCHOSCOPY;  Surgeon: Grace Isaac, MD;  Location: [facility];  Service: Thoracic;  Laterality: N/A;  . XI ROBOTIC ASSISTED THORACOSCOPY- SEGMENTECTOMY Right 03/11/2020   Procedure: XI ROBOTIC ASSISTED THORACOSCOPY-RIGHT SUPERIOR SEGMENTECTOMY WITH NODE SAMPLES;  Surgeon: Grace Isaac, MD;  Location: [facility];  Service: Thoracic;  Laterality: Right;    REVIEW OF SYSTEMS:  A comprehensive review of systems was negative except for: Constitutional: positive for fatigue   PHYSICAL EXAMINATION: General appearance: alert, cooperative, fatigued and no distress Head: Normocephalic, without obvious abnormality, atraumatic Neck: no adenopathy, no JVD, supple, symmetrical, trachea midline and thyroid not enlarged, symmetric, no tenderness/mass/nodules Lymph nodes: Cervical, supraclavicular, and axillary nodes normal. Resp: clear to auscultation bilaterally Back: symmetric, no curvature. ROM normal. No CVA tenderness. Cardio: regular rate and rhythm, S1, S2 normal, no murmur, click, rub or gallop GI: soft, non-tender; bowel sounds normal; no  masses,  no organomegaly Extremities: extremities normal, atraumatic, no cyanosis or edema  ECOG PERFORMANCE STATUS: 1 - Symptomatic but completely ambulatory  Blood pressure 130/79, pulse 81, temperature (!) 96.6 F (35.9 C), temperature source Tympanic, resp. rate 16, height 5\' 10"  (1.778 m), weight 171 lb (77.6 kg), SpO2 100 %.  LABORATORY DATA: Lab Results  Component Value Date   WBC 7.3 06/11/2020   HGB 11.8 (L) 06/11/2020   HCT 34.7 (L) 06/11/2020   MCV 95.1 06/11/2020   PLT 144 (L) 06/11/2020      Chemistry      Component Value Date/Time   NA 140 06/03/2020 0849   K 3.8 06/03/2020 0849   CL 107 06/03/2020 0849   CO2 28 06/03/2020 0849   BUN 9 06/03/2020 0849   CREATININE 0.88 06/03/2020 0849      Component Value  Date/Time   CALCIUM 10.2 06/03/2020 0849   ALKPHOS 305 (H) 06/03/2020 0849   AST 27 06/03/2020 0849   ALT 24 06/03/2020 0849   BILITOT 0.8 06/03/2020 0849       RADIOGRAPHIC STUDIES: CT Abdomen Pelvis W Contrast  Result Date: 05/16/2020 CLINICAL DATA:  Left lower quadrant abdominal pain over the last 2.5 weeks. Constipation. Chemotherapy in progress for small cell lung cancer. Prior right lower lobectomy. EXAM: CT ABDOMEN AND PELVIS WITH CONTRAST TECHNIQUE: Multidetector CT imaging of the abdomen and pelvis was performed using the standard protocol following bolus administration of intravenous contrast. CONTRAST:  167mL OMNIPAQUE IOHEXOL 300 MG/ML  SOLN COMPARISON:  PET-CT from 02/09/2020 FINDINGS: Lower chest: Small right pleural effusion. Ascending aortic aneurysm 4.3 cm in diameter, stable. Dense pericardial calcifications compatible with prior calcific pericarditis, similar to prior. Aortic, left anterior descending, and right coronary artery atherosclerosis. Dual lead pacer in place. Postoperative findings at the right lung base. Centrilobular emphysema. Reticular interstitial accentuation at the lung bases peripherally. Left infrahilar lymph node 0.9 cm  in short axis on image 2 of series 2. Hepatobiliary: Unremarkable Pancreas: Unremarkable Spleen: Unremarkable Adrenals/Urinary Tract: Simple appearing left kidney upper pole cyst. 2 mm left mid kidney nonobstructive renal calculus. Prostate gland mildly indents the bladder base. Borderline distal left hydroureter without obvious cause. Stomach/Bowel: Unremarkable Vascular/Lymphatic: Aortoiliac atherosclerotic vascular disease. Reproductive: Mild prostatomegaly. Other: No supplemental non-categorized findings. Musculoskeletal: Stable 80% compression fracture at T11. Fatty left spermatic cord. IMPRESSION: 1. A specific cause for the patient's left lower quadrant abdominal pain is not identified. 2. There is some borderline left distal hydroureter, but no cause is identified. 3. Ascending aortic aneurysm 4.3 cm in diameter, stable. Recommend annual imaging followup by CTA or MRA. This recommendation follows 2010 ACCF/AHA/AATS/ACR/ASA/SCA/SCAI/SIR/STS/SVM Guidelines for the Diagnosis and Management of Patients with Thoracic Aortic Disease. Circulation. 2010; 121: Z308-M578. Aortic aneurysm NOS (ICD10-I71.9) 4. Small right pleural effusion. 5. Other imaging findings of potential clinical significance: Dense pericardial calcifications compatible with prior calcific pericarditis, similar to prior. 2 mm left mid kidney nonobstructive renal calculus. Mild prostatomegaly. Stable 80% compression fracture at T11. Fatty left spermatic cord. 6. Emphysema and aortic atherosclerosis. Aortic Atherosclerosis (ICD10-I70.0) and Emphysema (ICD10-J43.9). Electronically Signed   By: Van Clines M.D.   On: 05/16/2020 08:51   CUP PACEART REMOTE DEVICE CHECK  Result Date: 05/28/2020 Scheduled remote reviewed. Normal device function.  2 AMS episodes, longest 14 hrs. Rates controlled. AF burden 1.5%. No [place] on record. Routing for further review. Next remote 91 days- JBox/CVRS   ASSESSMENT AND PLAN: This is a very pleasant 79  years old white male diagnosed with a stage Ib (T2 a, N0, M0) small cell lung cancer presented with superior segment right lower lobe nodule status post right lower lobe superior segmentectomy with lymph node dissection in August 2021 under the care of Dr. Servando Snare. The patient is currently undergoing adjuvant treatment with systemic chemotherapy with carboplatin for AUC of 5 on day 1 and etoposide 100 mg/M2 on days 1, 2 and 3 with Neulasta support.  He status post 3 cycles. The patient tolerated the last cycle of his treatment well with no concerning adverse effects. I recommended for him to proceed with cycle #4 today as planned. I will see him back for follow-up visit in 1 months for evaluation with repeat CT scan of the chest for restaging of his disease. The patient was advised to call immediately if he has any concerning symptoms in the interval. The patient  voices understanding of current disease status and treatment options and is in agreement with the current care plan.  All questions were answered. The patient knows to call the clinic with any problems, questions or concerns. We can certainly see the patient much sooner if necessary.  Disclaimer: This note was dictated with voice recognition software. Similar sounding words can inadvertently be transcribed and may not be corrected upon review.

## 2020-06-12 ENCOUNTER — Other Ambulatory Visit: Payer: Self-pay

## 2020-06-12 ENCOUNTER — Inpatient Hospital Stay: Payer: Medicare Other

## 2020-06-12 VITALS — BP 145/83 | HR 86 | Temp 97.7°F | Resp 18

## 2020-06-12 DIAGNOSIS — C3431 Malignant neoplasm of lower lobe, right bronchus or lung: Secondary | ICD-10-CM

## 2020-06-12 DIAGNOSIS — Z5111 Encounter for antineoplastic chemotherapy: Secondary | ICD-10-CM | POA: Diagnosis not present

## 2020-06-12 DIAGNOSIS — K219 Gastro-esophageal reflux disease without esophagitis: Secondary | ICD-10-CM | POA: Diagnosis not present

## 2020-06-12 DIAGNOSIS — Z5189 Encounter for other specified aftercare: Secondary | ICD-10-CM | POA: Diagnosis not present

## 2020-06-12 DIAGNOSIS — K449 Diaphragmatic hernia without obstruction or gangrene: Secondary | ICD-10-CM | POA: Diagnosis not present

## 2020-06-12 MED ORDER — SODIUM CHLORIDE 0.9 % IV SOLN
100.0000 mg/m2 | Freq: Once | INTRAVENOUS | Status: AC
  Administered 2020-06-12: 190 mg via INTRAVENOUS
  Filled 2020-06-12: qty 9.5

## 2020-06-12 MED ORDER — SODIUM CHLORIDE 0.9 % IV SOLN
Freq: Once | INTRAVENOUS | Status: AC
  Filled 2020-06-12: qty 250

## 2020-06-12 MED ORDER — SODIUM CHLORIDE 0.9 % IV SOLN
10.0000 mg | Freq: Once | INTRAVENOUS | Status: AC
  Administered 2020-06-12: 10 mg via INTRAVENOUS
  Filled 2020-06-12: qty 10

## 2020-06-12 NOTE — Patient Instructions (Signed)
[facility] Discharge Instructions for Patients Receiving Chemotherapy  Today you received the following chemotherapy agents Etoposide  To help prevent nausea and vomiting after your treatment, we encourage you to take your nausea medication as directed.    If you develop nausea and vomiting that is not controlled by your nausea medication, call the clinic.   BELOW ARE SYMPTOMS THAT SHOULD BE REPORTED IMMEDIATELY:  *FEVER GREATER THAN 100.5 F  *CHILLS WITH OR WITHOUT FEVER  NAUSEA AND VOMITING THAT IS NOT CONTROLLED WITH YOUR NAUSEA MEDICATION  *UNUSUAL SHORTNESS OF BREATH  *UNUSUAL BRUISING OR BLEEDING  TENDERNESS IN MOUTH AND THROAT WITH OR WITHOUT PRESENCE OF ULCERS  *URINARY PROBLEMS  *BOWEL PROBLEMS  UNUSUAL RASH Items with * indicate a potential emergency and should be followed up as soon as possible.  Feel free to call the clinic should you have any questions or concerns. The clinic phone number is [phone].  Please show the [facility] at check-in to the Emergency Department and triage nurse.

## 2020-06-13 ENCOUNTER — Other Ambulatory Visit: Payer: Self-pay

## 2020-06-13 ENCOUNTER — Inpatient Hospital Stay: Payer: Medicare Other

## 2020-06-13 VITALS — BP 149/109 | HR 86 | Temp 97.6°F | Resp 18

## 2020-06-13 DIAGNOSIS — Z5111 Encounter for antineoplastic chemotherapy: Secondary | ICD-10-CM | POA: Diagnosis not present

## 2020-06-13 DIAGNOSIS — C3431 Malignant neoplasm of lower lobe, right bronchus or lung: Secondary | ICD-10-CM

## 2020-06-13 DIAGNOSIS — Z5189 Encounter for other specified aftercare: Secondary | ICD-10-CM | POA: Diagnosis not present

## 2020-06-13 DIAGNOSIS — K219 Gastro-esophageal reflux disease without esophagitis: Secondary | ICD-10-CM | POA: Diagnosis not present

## 2020-06-13 DIAGNOSIS — K449 Diaphragmatic hernia without obstruction or gangrene: Secondary | ICD-10-CM | POA: Diagnosis not present

## 2020-06-13 MED ORDER — SODIUM CHLORIDE 0.9 % IV SOLN
10.0000 mg | Freq: Once | INTRAVENOUS | Status: AC
  Administered 2020-06-13: 10 mg via INTRAVENOUS
  Filled 2020-06-13: qty 10

## 2020-06-13 MED ORDER — SODIUM CHLORIDE 0.9 % IV SOLN
100.0000 mg/m2 | Freq: Once | INTRAVENOUS | Status: AC
  Administered 2020-06-13: 190 mg via INTRAVENOUS
  Filled 2020-06-13: qty 9.5

## 2020-06-13 MED ORDER — SODIUM CHLORIDE 0.9 % IV SOLN
Freq: Once | INTRAVENOUS | Status: AC
  Filled 2020-06-13: qty 250

## 2020-06-13 NOTE — Patient Instructions (Signed)
[facility] Discharge Instructions for Patients Receiving Chemotherapy  Today you received the following chemotherapy agents Etoposide  To help prevent nausea and vomiting after your treatment, we encourage you to take your nausea medication as directed.    If you develop nausea and vomiting that is not controlled by your nausea medication, call the clinic.   BELOW ARE SYMPTOMS THAT SHOULD BE REPORTED IMMEDIATELY:  *FEVER GREATER THAN 100.5 F  *CHILLS WITH OR WITHOUT FEVER  NAUSEA AND VOMITING THAT IS NOT CONTROLLED WITH YOUR NAUSEA MEDICATION  *UNUSUAL SHORTNESS OF BREATH  *UNUSUAL BRUISING OR BLEEDING  TENDERNESS IN MOUTH AND THROAT WITH OR WITHOUT PRESENCE OF ULCERS  *URINARY PROBLEMS  *BOWEL PROBLEMS  UNUSUAL RASH Items with * indicate a potential emergency and should be followed up as soon as possible.  Feel free to call the clinic should you have any questions or concerns. The clinic phone number is [phone].  Please show the [facility] at check-in to the Emergency Department and triage nurse.

## 2020-06-14 ENCOUNTER — Other Ambulatory Visit: Payer: Self-pay | Admitting: Physician Assistant

## 2020-06-14 ENCOUNTER — Telehealth: Payer: Self-pay

## 2020-06-14 DIAGNOSIS — R609 Edema, unspecified: Secondary | ICD-10-CM

## 2020-06-14 MED ORDER — FUROSEMIDE 20 MG PO TABS: ORAL_TABLET | ORAL | 0 refills | Status: DC

## 2020-06-14 NOTE — Telephone Encounter (Signed)
Pt LM advising her has gained 5lb retaining fluid wince his last chemo 06/13/20 and wants to know if he should take a diuretic.  Discussed with Cassie PA-C who advised she will send rx of Lasix 20mg  to be taken PRN only.  I called pt back and advised as indiacted. Pt was also advised to relieve himself in the morning and immediately weight himself every 25hrs and if he finds he has gained 2 or more pounds, he can take a dose. Pt expressed understanding of this information.

## 2020-06-15 ENCOUNTER — Inpatient Hospital Stay: Payer: Medicare Other

## 2020-06-15 ENCOUNTER — Other Ambulatory Visit: Payer: Self-pay

## 2020-06-15 VITALS — BP 135/79 | HR 78 | Temp 97.6°F | Resp 18

## 2020-06-15 DIAGNOSIS — K219 Gastro-esophageal reflux disease without esophagitis: Secondary | ICD-10-CM | POA: Diagnosis not present

## 2020-06-15 DIAGNOSIS — Z5111 Encounter for antineoplastic chemotherapy: Secondary | ICD-10-CM | POA: Diagnosis not present

## 2020-06-15 DIAGNOSIS — C3431 Malignant neoplasm of lower lobe, right bronchus or lung: Secondary | ICD-10-CM

## 2020-06-15 DIAGNOSIS — Z5189 Encounter for other specified aftercare: Secondary | ICD-10-CM | POA: Diagnosis not present

## 2020-06-15 DIAGNOSIS — K449 Diaphragmatic hernia without obstruction or gangrene: Secondary | ICD-10-CM | POA: Diagnosis not present

## 2020-06-15 MED ORDER — PEGFILGRASTIM-JMDB 6 MG/0.6ML ~~LOC~~ SOSY
6.0000 mg | PREFILLED_SYRINGE | Freq: Once | SUBCUTANEOUS | Status: AC
  Administered 2020-06-15: 6 mg via SUBCUTANEOUS

## 2020-06-18 ENCOUNTER — Other Ambulatory Visit: Payer: Self-pay

## 2020-06-18 ENCOUNTER — Inpatient Hospital Stay: Payer: Medicare Other

## 2020-06-18 DIAGNOSIS — Z5111 Encounter for antineoplastic chemotherapy: Secondary | ICD-10-CM | POA: Diagnosis not present

## 2020-06-18 DIAGNOSIS — Z5189 Encounter for other specified aftercare: Secondary | ICD-10-CM | POA: Diagnosis not present

## 2020-06-18 DIAGNOSIS — K219 Gastro-esophageal reflux disease without esophagitis: Secondary | ICD-10-CM | POA: Diagnosis not present

## 2020-06-18 DIAGNOSIS — C3431 Malignant neoplasm of lower lobe, right bronchus or lung: Secondary | ICD-10-CM

## 2020-06-18 DIAGNOSIS — K449 Diaphragmatic hernia without obstruction or gangrene: Secondary | ICD-10-CM | POA: Diagnosis not present

## 2020-06-18 LAB — CMP (CANCER CENTER ONLY)
ALT: 21 U/L (ref 0–44)
AST: 24 U/L (ref 15–41)
Albumin: 3.7 g/dL (ref 3.5–5.0)
Alkaline Phosphatase: 269 U/L — ABNORMAL HIGH (ref 38–126)
Anion gap: 6 (ref 5–15)
BUN: 19 mg/dL (ref 8–23)
CO2: 30 mmol/L (ref 22–32)
Calcium: 10 mg/dL (ref 8.9–10.3)
Chloride: 101 mmol/L (ref 98–111)
Creatinine: 0.79 mg/dL (ref 0.61–1.24)
GFR, Estimated: >60 mL/min (ref >60)
Glucose, Bld: 92 mg/dL (ref 70–99)
Potassium: 3.9 mmol/L (ref 3.5–5.1)
Sodium: 137 mmol/L (ref 135–145)
Total Bilirubin: 2.4 mg/dL — ABNORMAL HIGH (ref 0.3–1.2)
Total Protein: 6.2 g/dL — ABNORMAL LOW (ref 6.5–8.1)

## 2020-06-18 LAB — CBC WITH DIFFERENTIAL (CANCER CENTER ONLY)
Abs Immature Granulocytes: 0.38 10*3/uL — ABNORMAL HIGH (ref 0.00–0.07)
Band Neutrophils: 0 %
Basophils Absolute: 0 10*3/uL (ref 0.0–0.1)
Basophils Relative: 0 %
Blasts: 0 %
Eosinophils Absolute: 0 10*3/uL (ref 0.0–0.5)
Eosinophils Relative: 0 %
HCT: 28.2 % — ABNORMAL LOW (ref 39.0–52.0)
Hemoglobin: 9.5 g/dL — ABNORMAL LOW (ref 13.0–17.0)
Lymphocytes Relative: 6 %
Lymphs Abs: 1.2 10*3/uL (ref 0.7–4.0)
MCH: 33.1 pg (ref 26.0–34.0)
MCHC: 33.7 g/dL (ref 30.0–36.0)
MCV: 98.3 fL (ref 80.0–100.0)
Metamyelocytes Relative: 2 %
Monocytes Absolute: 0.4 10*3/uL (ref 0.1–1.0)
Monocytes Relative: 2 %
Myelocytes: 0 %
Neutro Abs: 17.2 10*3/uL — ABNORMAL HIGH (ref 1.7–7.7)
Neutrophils Relative %: 90 %
Other: 0 %
Platelet Count: 61 10*3/uL — ABNORMAL LOW (ref 150–400)
Promyelocytes Relative: 0 %
RBC: 2.87 MIL/uL — ABNORMAL LOW (ref 4.22–5.81)
RDW: 18.7 % — ABNORMAL HIGH (ref 11.5–15.5)
WBC Count: 19.2 10*3/uL — ABNORMAL HIGH (ref 4.0–10.5)
nRBC: 0 % (ref 0.0–0.2)
nRBC: 0 /100 WBC

## 2020-06-25 ENCOUNTER — Inpatient Hospital Stay: Payer: Medicare Other

## 2020-06-25 ENCOUNTER — Telehealth: Payer: Self-pay

## 2020-06-25 ENCOUNTER — Other Ambulatory Visit: Payer: Self-pay

## 2020-06-25 DIAGNOSIS — K219 Gastro-esophageal reflux disease without esophagitis: Secondary | ICD-10-CM | POA: Diagnosis not present

## 2020-06-25 DIAGNOSIS — C3431 Malignant neoplasm of lower lobe, right bronchus or lung: Secondary | ICD-10-CM

## 2020-06-25 DIAGNOSIS — K449 Diaphragmatic hernia without obstruction or gangrene: Secondary | ICD-10-CM | POA: Diagnosis not present

## 2020-06-25 DIAGNOSIS — Z5111 Encounter for antineoplastic chemotherapy: Secondary | ICD-10-CM | POA: Diagnosis not present

## 2020-06-25 DIAGNOSIS — Z5189 Encounter for other specified aftercare: Secondary | ICD-10-CM | POA: Diagnosis not present

## 2020-06-25 LAB — CMP (CANCER CENTER ONLY)
ALT: 22 U/L (ref 0–44)
AST: 26 U/L (ref 15–41)
Albumin: 3.7 g/dL (ref 3.5–5.0)
Alkaline Phosphatase: 291 U/L — ABNORMAL HIGH (ref 38–126)
Anion gap: 9 (ref 5–15)
BUN: 9 mg/dL (ref 8–23)
CO2: 26 mmol/L (ref 22–32)
Calcium: 10 mg/dL (ref 8.9–10.3)
Chloride: 106 mmol/L (ref 98–111)
Creatinine: 0.92 mg/dL (ref 0.61–1.24)
GFR, Estimated: >60 mL/min (ref >60)
Glucose, Bld: 94 mg/dL (ref 70–99)
Potassium: 3.8 mmol/L (ref 3.5–5.1)
Sodium: 141 mmol/L (ref 135–145)
Total Bilirubin: 1.1 mg/dL (ref 0.3–1.2)
Total Protein: 6.4 g/dL — ABNORMAL LOW (ref 6.5–8.1)

## 2020-06-25 LAB — CBC WITH DIFFERENTIAL (CANCER CENTER ONLY)
Abs Immature Granulocytes: 0.25 10*3/uL — ABNORMAL HIGH (ref 0.00–0.07)
Basophils Absolute: 0.1 10*3/uL (ref 0.0–0.1)
Basophils Relative: 1 %
Eosinophils Absolute: 0.1 10*3/uL (ref 0.0–0.5)
Eosinophils Relative: 1 %
HCT: 31.5 % — ABNORMAL LOW (ref 39.0–52.0)
Hemoglobin: 10.6 g/dL — ABNORMAL LOW (ref 13.0–17.0)
Immature Granulocytes: 3 %
Lymphocytes Relative: 14 %
Lymphs Abs: 1.3 10*3/uL (ref 0.7–4.0)
MCH: 33.1 pg (ref 26.0–34.0)
MCHC: 33.7 g/dL (ref 30.0–36.0)
MCV: 98.4 fL (ref 80.0–100.0)
Monocytes Absolute: 0.9 10*3/uL (ref 0.1–1.0)
Monocytes Relative: 10 %
Neutro Abs: 6.8 10*3/uL (ref 1.7–7.7)
Neutrophils Relative %: 71 %
Platelet Count: 40 10*3/uL — ABNORMAL LOW (ref 150–400)
RBC: 3.2 MIL/uL — ABNORMAL LOW (ref 4.22–5.81)
RDW: 20.8 % — ABNORMAL HIGH (ref 11.5–15.5)
WBC Count: 9.4 10*3/uL (ref 4.0–10.5)
nRBC: 0 % (ref 0.0–0.2)

## 2020-06-25 NOTE — Telephone Encounter (Addendum)
I spoke with pt and advised as indicated. He expressed understanding of this information.  ----- Message from Harvey, PA-C sent at 06/25/2020  9:58 AM EST ----- Can you call him and tell him to hold his aspirin and plavix until his labs can be checked next week. His platelet count is low.  ----- Message ----- From: Interface, Lab In [facility] Sent: 06/25/2020   9:06 AM EST To: Cassandra L Heilingoetter, PA-C

## 2020-06-25 NOTE — Telephone Encounter (Signed)
Pt called to advise he has leg swelling, SOB and a cold. Pt states he has a Z-pak at home and wants to know if he should take it.  I asked pt, per our previous discussion, if he has been weighting himself daily and he confirmed he has. He has gained 6-7 pounds in 3 days but has not been taking his Lasix. I advised pt to take a Lasix today and weigh again in the morning as he has been and if his weight isn't down, to call us. Pt confirms he does not have a fever.  I also discussed with Dr. Julien Nordmann to determine if there is anything else he wants the pt to do. He concurs with pt taking Lasix. He does not feel the pt need antibiotics for his cold at this time as the pt is not running a fever and pts SOB could be due to possible accumulation of the fluid he is retaining. Pt has been advised of this and expressed understanding. Pt indicated he will call tomorrow if his weight does not decrease.

## 2020-06-26 ENCOUNTER — Encounter (HOSPITAL_COMMUNITY): Payer: Self-pay

## 2020-06-26 ENCOUNTER — Ambulatory Visit (HOSPITAL_COMMUNITY)
Admission: RE | Admit: 2020-06-26 | Discharge: 2020-06-26 | Disposition: A | Discharge status: Home / Self Care | Payer: Medicare Other | Source: Ambulatory Visit | Attending: Medical | Admitting: Medical

## 2020-06-26 ENCOUNTER — Telehealth: Payer: Self-pay | Admitting: Medical Oncology

## 2020-06-26 ENCOUNTER — Other Ambulatory Visit: Payer: Self-pay | Admitting: Medical

## 2020-06-26 ENCOUNTER — Other Ambulatory Visit: Payer: Self-pay | Admitting: Medical Oncology

## 2020-06-26 ENCOUNTER — Inpatient Hospital Stay (HOSPITAL_BASED_OUTPATIENT_CLINIC_OR_DEPARTMENT_OTHER): Payer: Medicare Other | Admitting: Medical

## 2020-06-26 ENCOUNTER — Ambulatory Visit (HOSPITAL_COMMUNITY)
Admission: RE | Admit: 2020-06-26 | Discharge: 2020-06-26 | Disposition: A | Discharge status: Home / Self Care | Payer: Medicare Other | Source: Ambulatory Visit | Attending: Internal Medicine | Admitting: Internal Medicine

## 2020-06-26 VITALS — BP 144/76 | HR 72 | Temp 97.0°F | Resp 18 | Ht 70.0 in | Wt 173.7 lb

## 2020-06-26 DIAGNOSIS — I809 Phlebitis and thrombophlebitis of unspecified site: Secondary | ICD-10-CM

## 2020-06-26 DIAGNOSIS — C3431 Malignant neoplasm of lower lobe, right bronchus or lung: Secondary | ICD-10-CM

## 2020-06-26 DIAGNOSIS — R0602 Shortness of breath: Secondary | ICD-10-CM | POA: Insufficient documentation

## 2020-06-26 DIAGNOSIS — R079 Chest pain, unspecified: Secondary | ICD-10-CM | POA: Diagnosis not present

## 2020-06-26 DIAGNOSIS — R071 Chest pain on breathing: Secondary | ICD-10-CM

## 2020-06-26 DIAGNOSIS — J90 Pleural effusion, not elsewhere classified: Secondary | ICD-10-CM | POA: Diagnosis not present

## 2020-06-26 DIAGNOSIS — K219 Gastro-esophageal reflux disease without esophagitis: Secondary | ICD-10-CM | POA: Diagnosis not present

## 2020-06-26 DIAGNOSIS — Z5111 Encounter for antineoplastic chemotherapy: Secondary | ICD-10-CM | POA: Diagnosis not present

## 2020-06-26 DIAGNOSIS — Z5189 Encounter for other specified aftercare: Secondary | ICD-10-CM | POA: Diagnosis not present

## 2020-06-26 DIAGNOSIS — R609 Edema, unspecified: Secondary | ICD-10-CM

## 2020-06-26 DIAGNOSIS — K449 Diaphragmatic hernia without obstruction or gangrene: Secondary | ICD-10-CM | POA: Diagnosis not present

## 2020-06-26 IMAGING — CT CT ANGIO CHEST
2 of 7 series · 17 of 46 positions shown · IV contrast (APPLIED)
Comparison: CT abdomen pelvis [DATE], PET CT [DATE]

CLINICAL DATA: Chest pain, shortness of breath. History of
small-cell lung cancer 8/upper lobe. 21 status post right lower
lobectomy and chemotherapy. Difficulty breathing and chest pain for
1 week.

EXAM:
CT ANGIOGRAPHY CHEST WITH CONTRAST
TECHNIQUE: Multidetector CT imaging of the chest was performed using the
standard protocol during bolus administration of intravenous
contrast. Multiplanar CT image reconstructions and MIPs were
obtained to evaluate the vascular anatomy.
CONTRAST:  100mL OMNIPAQUE IOHEXOL 350 MG/ML SOLN

[Series 5: thins · axial · 0.84mm/px · z∈[-214,+94]mm · 15 of 352 slices shown]
[im 22/352  lung]
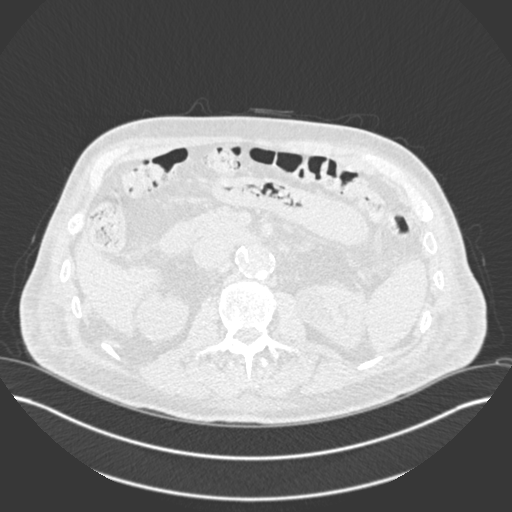
[im 44/352  soft-tissue]
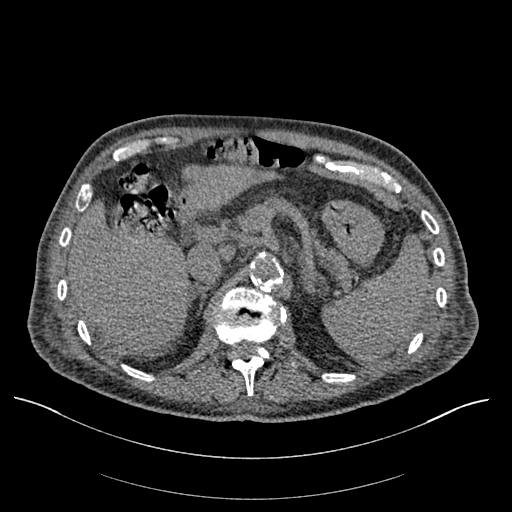
[im 66/352  lung]
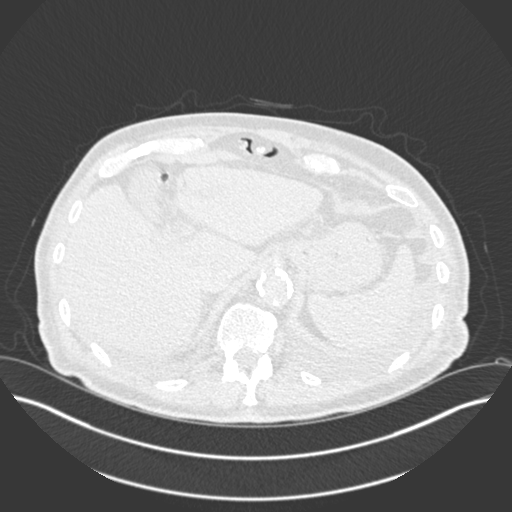
[im 88/352  soft-tissue]
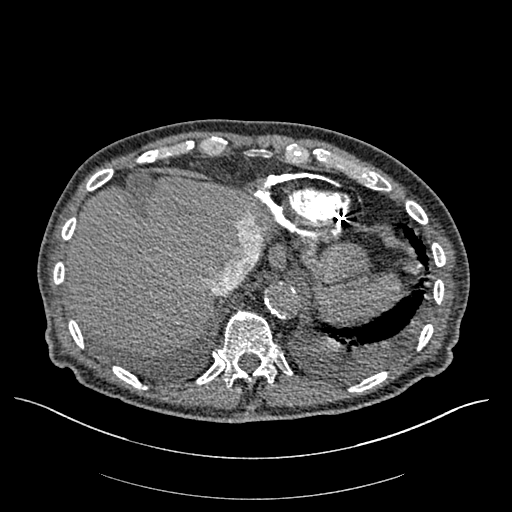
[im 110/352  lung]
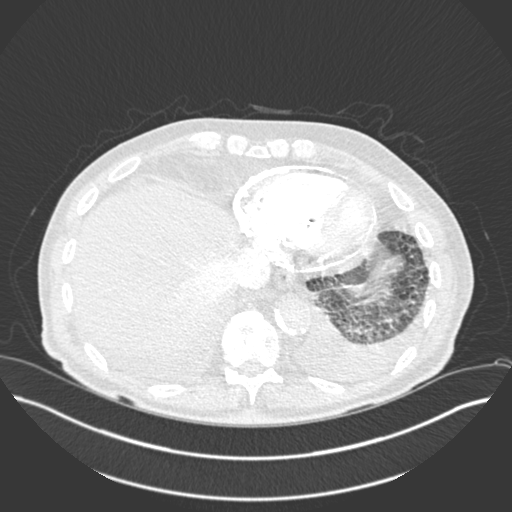
[im 132/352  soft-tissue]
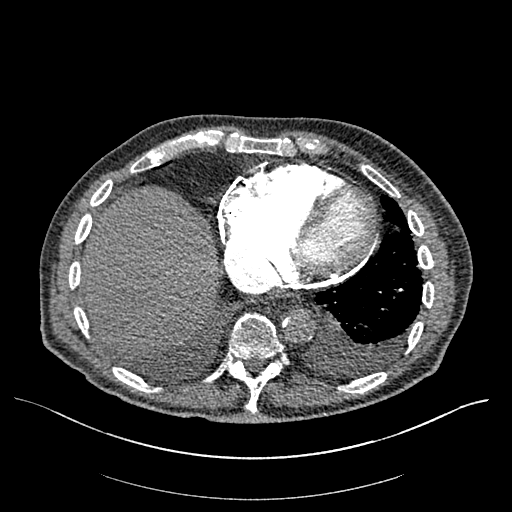
[im 154/352  lung]
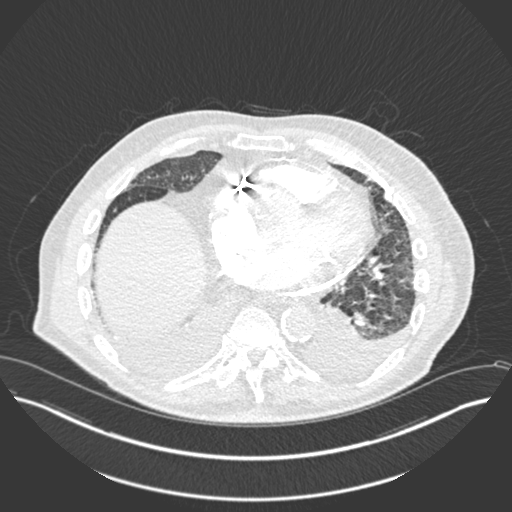
[im 176/352  soft-tissue]
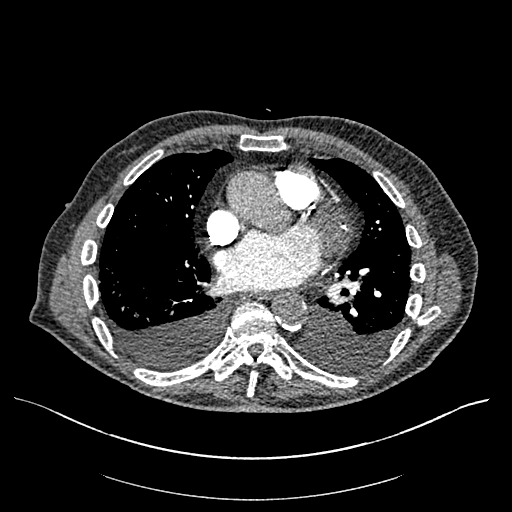
[im 198/352  lung]
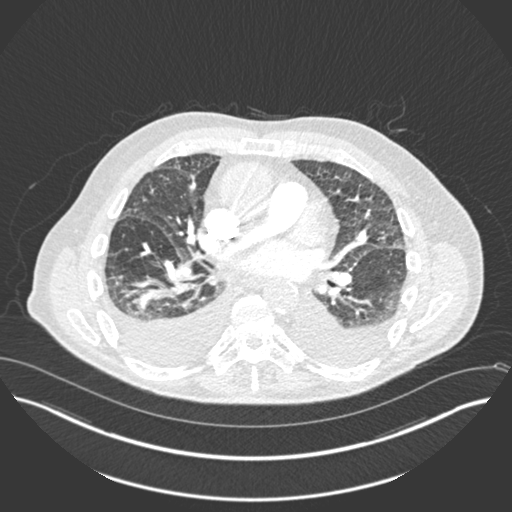
[im 220/352  soft-tissue]
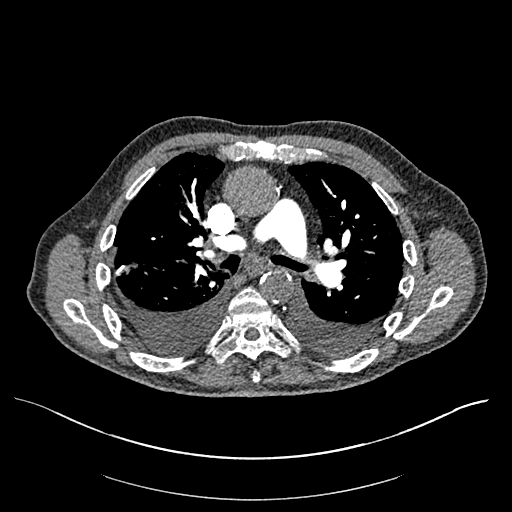
[im 242/352  lung]
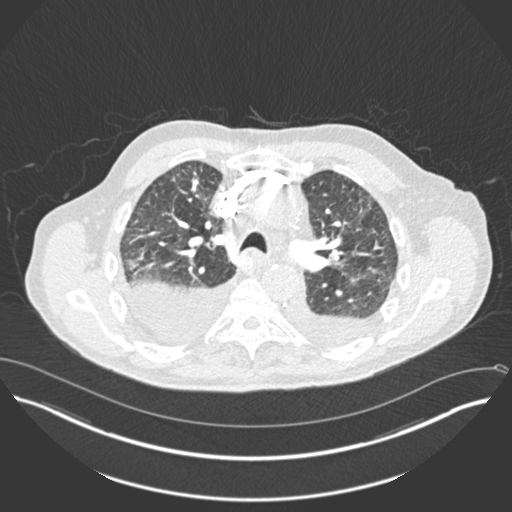
[im 264/352  soft-tissue]
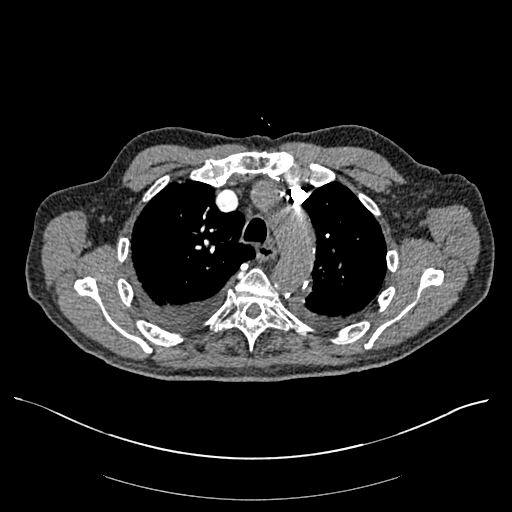
[im 286/352  lung]
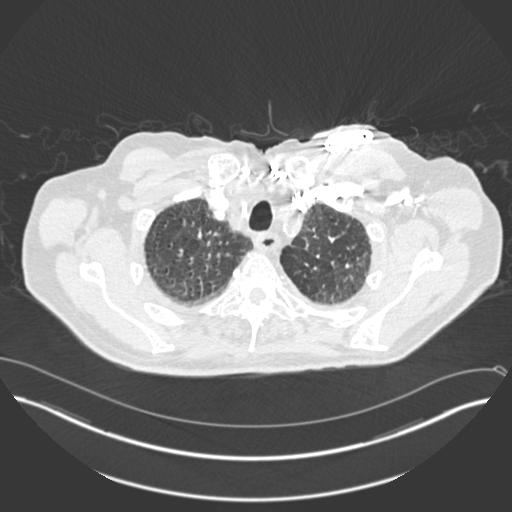
[im 308/352  soft-tissue]
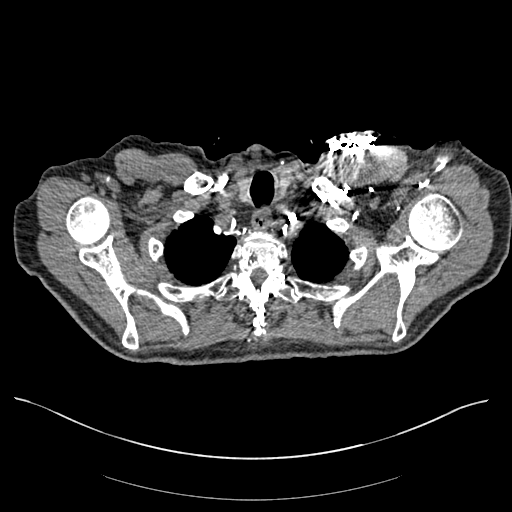
[im 330/352  lung]
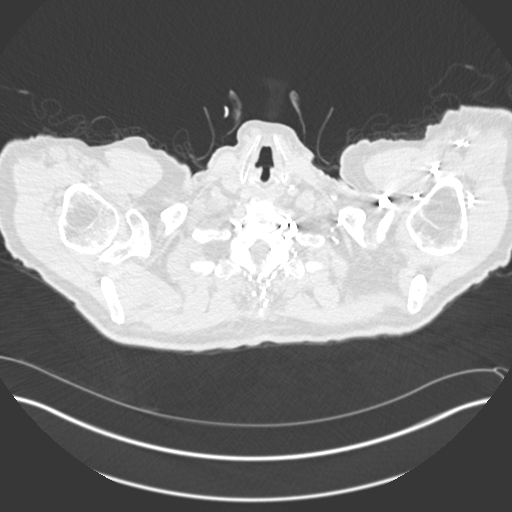

[Series 7: coronal mpr · coronal · 0.70mm/px · 2 of 88 slices shown]
[im 30/88  soft-tissue]
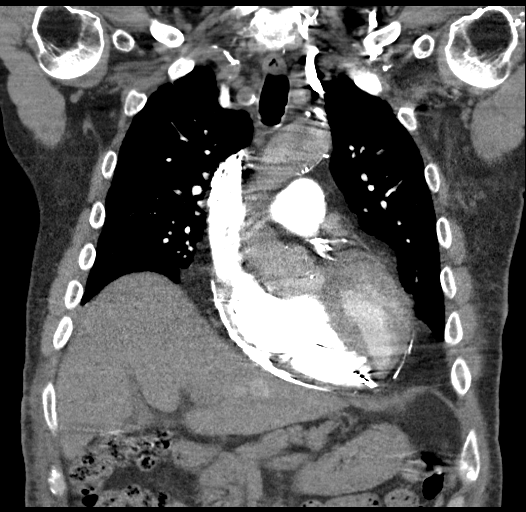
[im 59/88  soft-tissue]
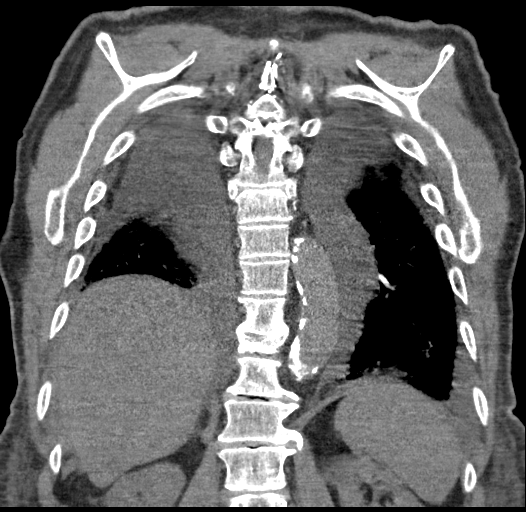

[17 of 46 positions shown; findings below may reference images not displayed]

FINDINGS: Lines and tubes: Left chest wall 2 lead cardiac pacemaker with leads
terminating in the right ventricle and right atrium.

Cardiovascular:

Satisfactory opacification of the pulmonary arteries to the
segmental level. No evidence of pulmonary embolism. The main
pulmonary artery is normal in caliber.

Normal heart size. Redemonstration of dense pericardial
calcifications no associated significant pericardial fluid. Mild
aortic valve leaflet calcifications. Stable ascending thoracic aorta
caliber measuring up to 4.3 cm on axial imaging ([DATE]). The
descending thoracic aorta is normal in caliber. Moderate
atherosclerotic plaque of the aorta and its major branches. At least
moderate four-vessel coronary artery calcifications.

Mediastinum/Nodes: Several prominent but no definite enlarged
mediastinal lymph nodes. No enlarged mediastinal, hilar, or axillary
lymph nodes. Thyroid gland, trachea, and esophagus demonstrate no
significant findings.

Lungs/Pleura:

Limited evaluation of the lung parenchyma due to motion artifact.
Status post right lower lobectomy. Expiratory phase of respiration.
Linear atelectasis versus scarring within the right upper lobe. No
focal consolidation. No definite pulmonary nodule or mass.

Interval increase in a trace to small volume right pleural effusion.
Interval development of a trace to small volume left pleural
effusion. No pneumothorax.

Upper Abdomen: Severe atherosclerotic plaque of the abdominal aorta.
Suggestion of a similar appearing focal suprarenal abdominal aorta
dissection ([DATE]).

Musculoskeletal:

No chest wall abnormality

No suspicious lytic or blastic osseous lesions. No acute displaced
fracture. Multilevel degenerative changes of the spine. Chronic
appearing mild T7 vertebral body compression fracture. Chronic
appearing T11 compression fracture with greater than 80% height
loss.

Review of the MIP images confirms the above findings.
IMPRESSION: 1.  No pulmonary embolus.
2. Interval increase in size of a small right pleural effusion.
Interval development of a left trace to small pleural effusion.
3. Chronic appearing T7 and T11 compression fractures. Correlate
with midline tenderness to palpation to evaluate for an acute
component.
4. Chronic pericardial calcification.
5. Stable ascending aorta aneurysm measuring up to 4.3 cm. Possible
stable focal chronic suprarenal abdominal aorta dissection.
6.  Aortic Atherosclerosis ([PZ]-[PZ]).

## 2020-06-26 IMAGING — DX DG CHEST 2V
2 series · 2 of 2 positions shown · non-contrast
Comparison: Multiple priors, most recent [DATE].

CLINICAL DATA: Worsening shortness of breath and nonproductive
cause.

EXAM:
CHEST - 2 VIEW

[chest pa]
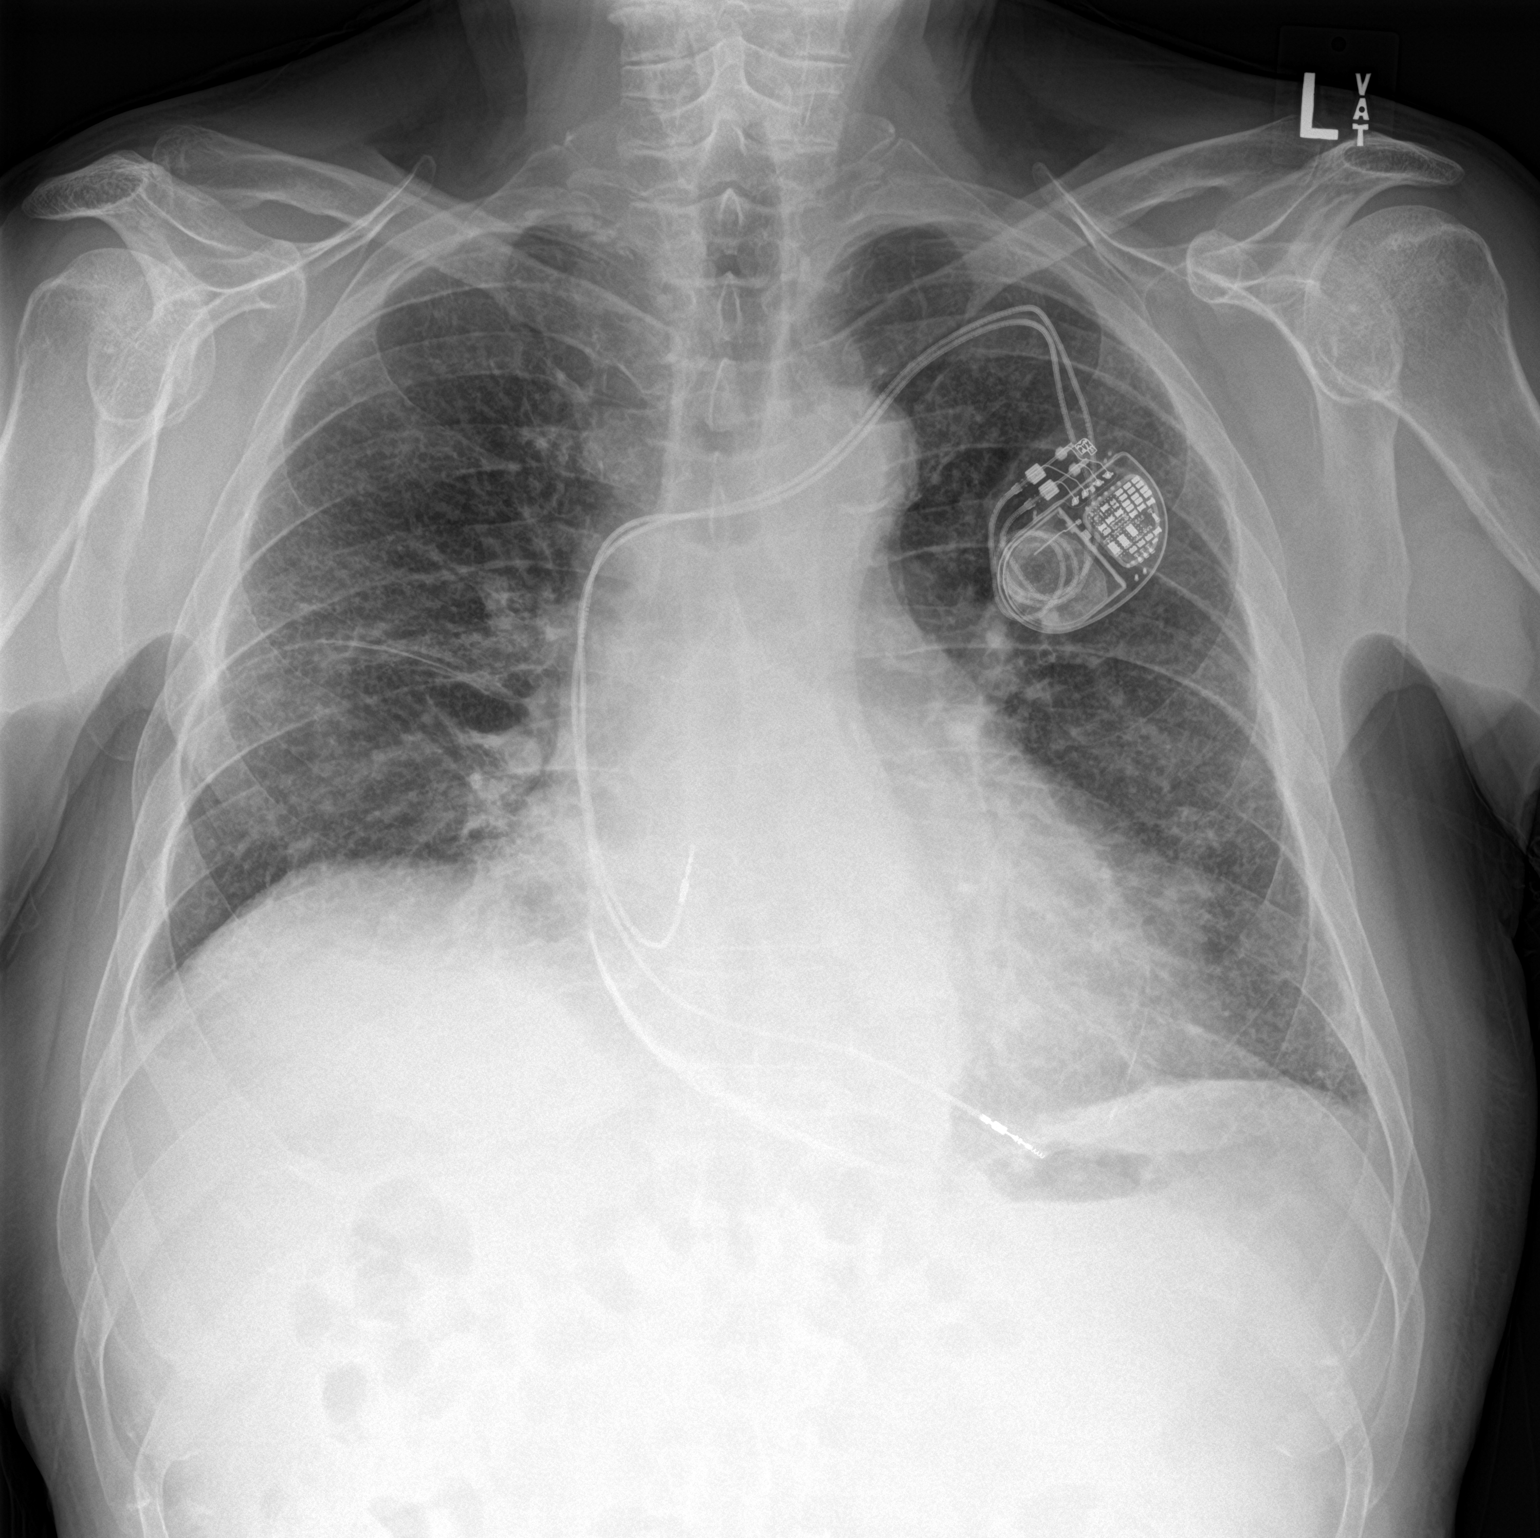

[chest lat]
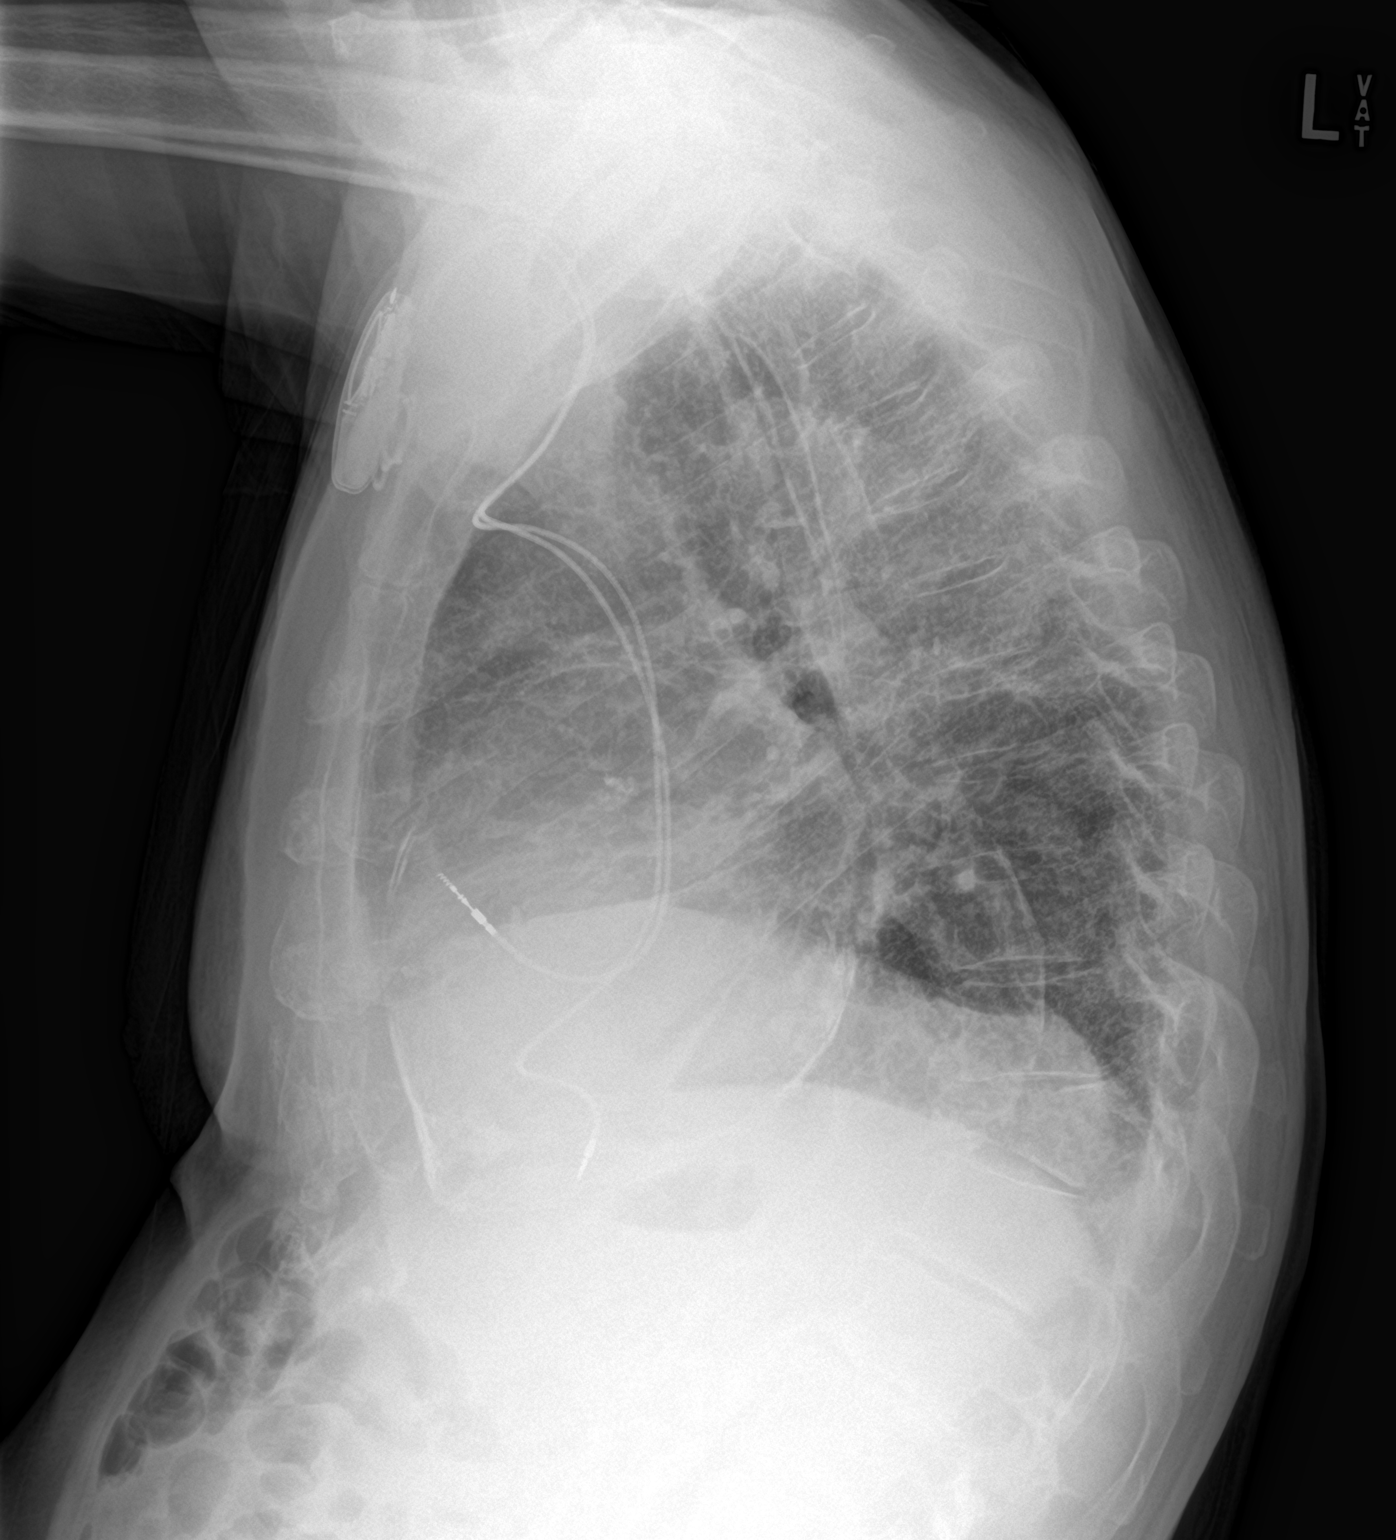

[2 of 2 positions shown; findings below may reference images not displayed]

FINDINGS: Chronic streaky right mid lung and bibasilar opacities. No visible
pleural effusions or pneumothorax. Similar cardiomediastinal
silhouette. Pericardial calcification. Similar position of a left
subclavian approach cardiac rhythm maintenance. No acute osseous
abnormality. Chronic lower thoracic compression fracture.
Polyarticular degenerative change.
IMPRESSION: Chronic streaky right mid lung and bibasilar opacities, favored to
reflect atelectasis/scar. No new focal consolidation.

## 2020-06-26 MED ORDER — FUROSEMIDE 20 MG PO TABS: ORAL_TABLET | ORAL | 0 refills | Status: DC

## 2020-06-26 MED ORDER — IOHEXOL 350 MG/ML SOLN
100.0000 mL | Freq: Once | INTRAVENOUS | Status: AC | PRN
  Administered 2020-06-26: 100 mL via INTRAVENOUS

## 2020-06-26 MED ORDER — CEPHALEXIN 500 MG PO CAPS: 500.0000 mg | ORAL_CAPSULE | Freq: Four times a day (QID) | ORAL | 0 refills | Status: DC

## 2020-06-26 NOTE — Telephone Encounter (Signed)
Edema and SOB w dry cough.  edema 2+ pretibial persists and SOB a little worsening with nonproductive cough ".   The cough started recently, described as dry nonproductive  Lasix did not help with fluid .    weight today =171 lb-down from 171.4  BP =129/83 Pulse = 98  Plan  Per Dr Julien Nordmann move up the date for his CT scan and if that cannot be done send pt for CXR. Pt may take Lasix bid prn -refill sent. Message sent to central scheduling.

## 2020-06-26 NOTE — Progress Notes (Signed)
[facility] Progress Note   Alexander Murray [id number] 31-Oct-1940 79 y.o.  Alexander Murray is managed by Dr. Fanny Bien. Alexander Murray  Actively treated with chemotherapy/immunotherapy/hormonal therapy: yes  Current therapy: Carboplatin and etoposide with Fulphila support  Last treated: 06/11/2020 (cycle 4, day 1)  Next scheduled appointment with provider: 07/11/2020  Assessment: Plan:    Shortness of breath - Plan: CT ANGIO CHEST PE W OR WO CONTRAST  Chest pain on breathing - Plan: CT ANGIO CHEST PE W OR WO CONTRAST  Phlebitis - Plan: cephALEXin (KEFLEX) 500 MG capsule  Small cell lung cancer, right lower lobe ([name])   Shortness of breath and dyspnea: Alexander Murray was referred for a CT angiogram to be completed today.  He was referred for a chest x-ray today which returned showing:  FINDINGS: Chronic streaky right mid lung and bibasilar opacities.  No visible pleural effusions or pneumothorax.  Similar cardiomediastinal silhouette.  Pericardial calcification.  Similar position of a left subclavian approach cardiac rhythm maintenance.  No acute osseous abnormality.  Chronic lower thoracic compression fracture. Polyarticular degenerative change.  IMPRESSION: Chronic streaky right mid lung and bibasilar opacities, favored to reflect atelectasis/scar.  No new focal consolidation.   Phlebitis of the right forearm: Alexander Murray was given a prescription for Keflex 500 mg p.o. 3 times daily x7 days.   Stage IB (T2 a, N0, M0) small cell lung cancer: Alexander Murray continues to be managed by Alexander Murray and is status post cycle 4, day 1 of carboplatin and etoposide with Fulphila support.  Cycle #4 of chemotherapy was begun on 06/11/2020.  He is scheduled to be seen in follow-up on 07/11/2020 with restaging scans completed prior to his return.  Please see After Visit Summary for patient specific instructions.  Future Appointments  Date Time Provider [place]   07/01/2020  9:00 AM [facility] None  07/04/2020  1:30 PM Grace Isaac, MD [facility]  07/09/2020  9:00 AM [facility] None  07/09/2020  4:00 PM Deboraha Sprang, MD [facility]  07/10/2020  9:30 AM [facility] 2 [facility] [place]  07/11/2020  8:45 AM Curt Bears, MD [facility] None  08/27/2020  8:20 AM [facility] DEVICE REMOTES [facility]  11/26/2020  8:20 AM [facility] DEVICE REMOTES [facility]  02/25/2021  8:20 AM [facility] DEVICE REMOTES [facility]  05/27/2021  8:20 AM [facility] DEVICE REMOTES [facility]  08/26/2021  8:20 AM [facility] DEVICE REMOTES [facility]  11/25/2021  8:20 AM [facility] DEVICE REMOTES [facility]    Orders Placed This Encounter  Procedures  . CT ANGIO CHEST PE W OR WO CONTRAST       Subjective:   Patient ID:  Alexander Murray is a 79 y.o. (DOB 1941-03-12) male.  Chief Complaint: No chief complaint on file.   HPI Alexander Murray  is a 79 y.o. male with a diagnosis of a stage IB (T2 a, N0, M0) small cell lung cancer. Alexander Murray continues to be followed by Alexander Murray and is status post cycle 4, day 1 of carboplatin and etoposide with Fulphila support which was begun on 06/11/2020.  He presents to the clinic today with a report of an area of erythema in his right dorsal wrist and right anterior forearm.  He does not have a port.  He has been given his chemotherapy peripherally.  He reports that he had similar areas of erythema after prior treatments.  He treated this with topical steroids which resolved his symptoms.  He is awaiting restaging CT scans prior to his follow-up appointment with Alexander Murray on 07/11/2020.  He has continued shortness of breath lower extremity pitting edema.  He had a chest x-ray completed today which returned showing:  FINDINGS: Chronic streaky right mid lung and bibasilar opacities.  No visible  pleural effusions or pneumothorax.  Similar cardiomediastinal silhouette.  Pericardial calcification.  Similar position of a left subclavian approach cardiac rhythm maintenance.  No acute osseous abnormality.  Chronic lower thoracic compression fracture. Polyarticular degenerative change.  IMPRESSION: Chronic streaky right mid lung and bibasilar opacities, favored to reflect atelectasis/scar.  No new focal consolidation.   Medications: I have reviewed the patient's current medications.  Allergies: No Known Allergies  Past Medical History:  Diagnosis Date  . Allergy   . Arthritis    "right knee and right thumb" (12/19/2014)  . Asthma    pt denies  . Bronchitis 07/2016   started in December and continued about 2 months  . Esophageal stricture   . GERD (gastroesophageal reflux disease)   . Hemochromatosis   . Hiatal hernia   . Hypercalcemia   . Hypercholesteremia   . Hypertension   . Internal hemorrhoids   . Lung cancer ([place])   . Osteoarthritis   . Palpitations   . Presence of permanent cardiac pacemaker    hx bradycardia  . Retinal artery occlusion, branch    "right eye"  . Second degree AV block, Mobitz type II   . Skin cancer    right shoulder  . Stroke [facility]) ~ 2011   "right eye"/ partial blindness  . Tubular adenoma of colon     Past Surgical History:  Procedure Laterality Date  . CATARACT EXTRACTION, BILATERAL  01/2017  . COLONOSCOPY    . EP IMPLANTABLE DEVICE N/A 12/19/2014   Procedure: Pacemaker Implant;  Surgeon: Deboraha Sprang, MD;  Location: [place] [facility];  Service: Cardiovascular;  Laterality: N/A;  . ESOPHAGOGASTRODUODENOSCOPY (EGD) WITH ESOPHAGEAL DILATION  X 3  . EYE SURGERY Bilateral 2017  . INGUINAL HERNIA REPAIR Left 1980's  . INSERT / REPLACE / REMOVE PACEMAKER  12/19/2014  . INTERCOSTAL NERVE BLOCK Right 03/11/2020   Procedure: INTERCOSTAL NERVE BLOCK;  Surgeon: Grace Isaac, MD;  Location: [facility];  Service: Thoracic;  Laterality:  Right;  . JOINT REPLACEMENT Right 2016  . KNEE ARTHROSCOPY Right ~ 1982; ~ 1992  . MOHS SURGERY Right ~ 2014   "pre-melanoma scapula"  . POSTERIOR TIBIAL TENDON REPAIR Left 2012  . TONSILLECTOMY  1946  . TOTAL KNEE ARTHROPLASTY Right 05/20/2015   Procedure: RIGHT TOTAL KNEE ARTHROPLASTY;  Surgeon: Paralee Cancel, MD;  Location[facility];  Service: Orthopedics;  Laterality: Right;  . UPPER GASTROINTESTINAL ENDOSCOPY    . VIDEO BRONCHOSCOPY N/A 03/11/2020   Procedure: VIDEO BRONCHOSCOPY;  Surgeon: Grace Isaac, MD;  Location: [facility];  Service: Thoracic;  Laterality: N/A;  . XI ROBOTIC ASSISTED THORACOSCOPY- SEGMENTECTOMY Right 03/11/2020   Procedure: XI ROBOTIC ASSISTED THORACOSCOPY-RIGHT SUPERIOR SEGMENTECTOMY WITH NODE SAMPLES;  Surgeon: Grace Isaac, MD;  Location: [facility] OR;  Service: Thoracic;  Laterality: Right;    Family History  Problem Relation Age of Onset  . Colon cancer Mother        dx in her 63s  . Heart disease Mother   . Alzheimer's disease Mother   . Colon cancer Father        dx in his early 91s  . Heart disease Father   . Prostate cancer  Father   . Prostate cancer Brother        1/2 brother  . Hemochromatosis Brother   . Prostate cancer Brother   . Esophageal cancer Neg Hx   . Pancreatic cancer Neg Hx   . Stomach cancer Neg Hx   . Liver disease Neg Hx     Social History   Socioeconomic History  . Marital status: Married    Spouse name: Not on file  . Number of children: Not on file  . Years of education: Not on file  . Highest education level: Not on file  Occupational History  . Occupation: retired - Doctor, general practice  Tobacco Use  . Smoking status: Former Smoker    Years: 3.00    Types: Cigarettes    Quit date: 05/16/1962    Years since quitting: 58.1  . Smokeless tobacco: Never Used  . Tobacco comment: "quit smoking in the 1960's"; was an intermittent smoker   Vaping Use  . Vaping Use: Never used  Substance and Sexual Activity  . Alcohol  use: No    Comment: 12/19/2014 "stopped drinking in ~ 2012"  . Drug use: No  . Sexual activity: Yes  Other Topics Concern  . Not on file  Social History Narrative  . Not on file   Social Determinants of Health   Financial Resource Strain:   . Difficulty of Paying Living Expenses: Not on file  Food Insecurity:   . Worried About Charity fundraiser in the Last Year: Not on file  . Ran Out of Food in the Last Year: Not on file  Transportation Needs:   . Lack of Transportation (Medical): Not on file  . Lack of Transportation (Non-Medical): Not on file  Physical Activity:   . Days of Exercise per Week: Not on file  . Minutes of Exercise per Session: Not on file  Stress:   . Feeling of Stress : Not on file  Social Connections:   . Frequency of Communication with Friends and Family: Not on file  . Frequency of Social Gatherings with Friends and Family: Not on file  . Attends Religious Services: Not on file  . Active Member of Clubs or Organizations: Not on file  . Attends Archivist Meetings: Not on file  . Marital Status: Not on file  Intimate Partner Violence:   . Fear of Current or Ex-Partner: Not on file  . Emotionally Abused: Not on file  . Physically Abused: Not on file  . Sexually Abused: Not on file    Past Medical History, Surgical history, Social history, and Family history were reviewed and updated as appropriate.   Please see review of systems for further details on the patient's review from today.   Review of Systems:  Review of Systems  Constitutional: Negative for chills, diaphoresis and fever.  HENT: Negative for facial swelling, trouble swallowing and voice change.   Respiratory: Positive for shortness of breath. Negative for cough, chest tightness and wheezing.   Cardiovascular: Negative for chest pain and palpitations.  Gastrointestinal: Negative for abdominal pain, constipation, diarrhea, nausea and vomiting.  Musculoskeletal: Negative for back  pain and myalgias.  Skin: Positive for color change and rash.  Neurological: Negative for dizziness, light-headedness and headaches.    Objective:   Physical Exam:  BP (!) 144/76 (BP Location: Right Arm, Patient Position: Sitting)   Pulse 72   Temp (!) 97 F (36.1 C) (Tympanic)   Resp 18   Ht 5\' 10"  (1.778 m)  Wt 173 lb 11.2 oz (78.8 kg)   SpO2 100%   BMI 24.92 kg/m  ECOG: 1  Physical Exam Constitutional:      General: He is not in acute distress.    Appearance: He is not diaphoretic.  HENT:     Head: Normocephalic and atraumatic.     Right Ear: External ear normal.     Left Ear: External ear normal.     Mouth/Throat:     Pharynx: No oropharyngeal exudate.  Eyes:     General: No scleral icterus.       Right eye: No discharge.        Left eye: No discharge.  Cardiovascular:     Rate and Rhythm: Normal rate and regular rhythm.     Heart sounds: Normal heart sounds. No murmur heard.  No friction rub. No gallop.   Pulmonary:     Effort: Pulmonary effort is normal. No respiratory distress.     Breath sounds: Normal breath sounds. No wheezing or rales.  Musculoskeletal:        General: No tenderness or deformity.     Cervical back: Normal range of motion and neck supple.     Right lower leg: Edema present.     Left lower leg: Edema present.     Comments: 1+ bilateral pitting edema to the knees.  Lymphadenopathy:     Cervical: No cervical adenopathy.  Skin:    General: Skin is warm and dry.     Findings: Erythema and rash present.     Comments: There is an area of erythema in the right dorsal wrist at the site of a prior IV.  There is induration along the vein underlying the area.  There are three areas of erythema and induration along the right medial forearm above underlying vasculature.  Neurological:     Mental Status: He is alert.     Coordination: Coordination normal.  Psychiatric:        Behavior: Behavior normal.        Thought Content: Thought content  normal.        Judgment: Judgment normal.         Lab Review:     Component Value Date/Time   NA 141 06/25/2020 0853   K 3.8 06/25/2020 0853   CL 106 06/25/2020 0853   CO2 26 06/25/2020 0853   GLUCOSE 94 06/25/2020 0853   BUN 9 06/25/2020 0853   CREATININE 0.92 06/25/2020 0853   CALCIUM 10.0 06/25/2020 0853   PROT 6.4 (L) 06/25/2020 0853   ALBUMIN 3.7 06/25/2020 0853   AST 26 06/25/2020 0853   ALT 22 06/25/2020 0853   ALKPHOS 291 (H) 06/25/2020 0853   BILITOT 1.1 06/25/2020 0853   GFRNONAA >60 06/25/2020 0853   GFRAA >60 05/06/2020 0912       Component Value Date/Time   WBC 9.4 06/25/2020 0853   WBC 7.8 03/13/2020 0146   RBC 3.20 (L) 06/25/2020 0853   HGB 10.6 (L) 06/25/2020 0853   HCT 31.5 (L) 06/25/2020 0853   PLT 40 (L) 06/25/2020 0853   MCV 98.4 06/25/2020 0853   MCH 33.1 06/25/2020 0853   MCHC 33.7 06/25/2020 0853   RDW 20.8 (H) 06/25/2020 0853   LYMPHSABS 1.3 06/25/2020 0853   MONOABS 0.9 06/25/2020 0853   EOSABS 0.1 06/25/2020 0853   BASOSABS 0.1 06/25/2020 0853   -------------------------------  Imaging from last 24 hours (if applicable):  Radiology interpretation: DG Chest 2 View  Result Date: 06/26/2020  CLINICAL DATA:  Worsening shortness of breath and nonproductive cause. EXAM: CHEST - 2 VIEW COMPARISON:  Multiple priors, most recent 03/28/2020. FINDINGS: Chronic streaky right mid lung and bibasilar opacities. No visible pleural effusions or pneumothorax. Similar cardiomediastinal silhouette. Pericardial calcification. Similar position of a left subclavian approach cardiac rhythm maintenance. No acute osseous abnormality. Chronic lower thoracic compression fracture. Polyarticular degenerative change. IMPRESSION: Chronic streaky right mid lung and bibasilar opacities, favored to reflect atelectasis/scar. No new focal consolidation. Electronically Signed   By: Margaretha Sheffield MD   On: 06/26/2020 13:33   CUP PACEART REMOTE DEVICE CHECK  Result  Date: 05/28/2020 Scheduled remote reviewed. Normal device function.  2 AMS episodes, longest 14 hrs. Rates controlled. AF burden 1.5%. No [place] on record. Routing for further review. Next remote 91 days- JBox/CVRS      This patient was seen with Alexander Murray with my treatment plan reviewed with him. He expressed agreement with my medical management of this patient.  ADDENDUM: Hematology/Oncology Attending: I had a face-to-face encounter with the patient.  I recommended his care plan.  This is a very pleasant 79 years old white male with limited stage small cell lung cancer status post resection and he is currently undergoing adjuvant treatment with carboplatin and etoposide status post 4 cycles.  He has been tolerating his systemic chemotherapy fairly well except for phlebitis of the upper extremity from the chemotherapy infusion site.  The patient also has been complaining of swelling of the lower extremities and shortness of breath recently. I recommended for him to have CT angiogram of the chest performed to rule out any pulmonary embolism or any other etiology. We will start the patient empirically on Keflex for the suspicious cellulitis of the upper extremities. The patient was also advised to continue on Lasix for now because of the swelling of the lower extremity and suspicious congestive heart failure. He will come back as previously scheduled for routine evaluation. He was advised to call immediately if he has any concerning symptoms in the interval. Disclaimer: This note was dictated with voice recognition software. Similar sounding words can inadvertently be transcribed and may be missed upon review. Eilleen Kempf, MD 07/01/20

## 2020-06-26 NOTE — Telephone Encounter (Signed)
    red and inflammed skin on arm where chemo given on 06/13/20 Per Mohamed appt today with [facility] Schedule message sent.

## 2020-06-29 NOTE — Progress Notes (Signed)
These results were called to [name] and were reviewed with him . His questions were answered. He expressed understanding.

## 2020-07-01 ENCOUNTER — Inpatient Hospital Stay: Payer: Medicare Other

## 2020-07-01 ENCOUNTER — Telehealth: Payer: Self-pay | Admitting: Internal Medicine

## 2020-07-01 ENCOUNTER — Telehealth: Payer: Self-pay

## 2020-07-01 ENCOUNTER — Encounter: Payer: Self-pay | Admitting: Medical

## 2020-07-01 ENCOUNTER — Other Ambulatory Visit: Payer: Self-pay

## 2020-07-01 ENCOUNTER — Ambulatory Visit (HOSPITAL_COMMUNITY): Payer: Medicare Other

## 2020-07-01 DIAGNOSIS — K219 Gastro-esophageal reflux disease without esophagitis: Secondary | ICD-10-CM | POA: Diagnosis not present

## 2020-07-01 DIAGNOSIS — Z5189 Encounter for other specified aftercare: Secondary | ICD-10-CM | POA: Diagnosis not present

## 2020-07-01 DIAGNOSIS — K449 Diaphragmatic hernia without obstruction or gangrene: Secondary | ICD-10-CM | POA: Diagnosis not present

## 2020-07-01 DIAGNOSIS — C3431 Malignant neoplasm of lower lobe, right bronchus or lung: Secondary | ICD-10-CM | POA: Diagnosis not present

## 2020-07-01 DIAGNOSIS — Z5111 Encounter for antineoplastic chemotherapy: Secondary | ICD-10-CM | POA: Diagnosis not present

## 2020-07-01 DIAGNOSIS — C3490 Malignant neoplasm of unspecified part of unspecified bronchus or lung: Secondary | ICD-10-CM

## 2020-07-01 LAB — CBC WITH DIFFERENTIAL (CANCER CENTER ONLY)
Abs Immature Granulocytes: 0.08 10*3/uL — ABNORMAL HIGH (ref 0.00–0.07)
Basophils Absolute: 0.1 10*3/uL (ref 0.0–0.1)
Basophils Relative: 1 %
Eosinophils Absolute: 0.1 10*3/uL (ref 0.0–0.5)
Eosinophils Relative: 1 %
HCT: 33.9 % — ABNORMAL LOW (ref 39.0–52.0)
Hemoglobin: 11.4 g/dL — ABNORMAL LOW (ref 13.0–17.0)
Immature Granulocytes: 1 %
Lymphocytes Relative: 14 %
Lymphs Abs: 1.1 10*3/uL (ref 0.7–4.0)
MCH: 33.6 pg (ref 26.0–34.0)
MCHC: 33.6 g/dL (ref 30.0–36.0)
MCV: 100 fL (ref 80.0–100.0)
Monocytes Absolute: 0.9 10*3/uL (ref 0.1–1.0)
Monocytes Relative: 12 %
Neutro Abs: 5.5 10*3/uL (ref 1.7–7.7)
Neutrophils Relative %: 71 %
Platelet Count: 136 10*3/uL — ABNORMAL LOW (ref 150–400)
RBC: 3.39 MIL/uL — ABNORMAL LOW (ref 4.22–5.81)
RDW: 20.9 % — ABNORMAL HIGH (ref 11.5–15.5)
WBC Count: 7.8 10*3/uL (ref 4.0–10.5)
nRBC: 0 % (ref 0.0–0.2)

## 2020-07-01 LAB — CMP (CANCER CENTER ONLY)
ALT: 20 U/L (ref 0–44)
AST: 32 U/L (ref 15–41)
Albumin: 3.6 g/dL (ref 3.5–5.0)
Alkaline Phosphatase: 274 U/L — ABNORMAL HIGH (ref 38–126)
Anion gap: 9 (ref 5–15)
BUN: 17 mg/dL (ref 8–23)
CO2: 27 mmol/L (ref 22–32)
Calcium: 10.2 mg/dL (ref 8.9–10.3)
Chloride: 104 mmol/L (ref 98–111)
Creatinine: 0.98 mg/dL (ref 0.61–1.24)
GFR, Estimated: >60 mL/min (ref >60)
Glucose, Bld: 97 mg/dL (ref 70–99)
Potassium: 3.8 mmol/L (ref 3.5–5.1)
Sodium: 140 mmol/L (ref 135–145)
Total Bilirubin: 1.3 mg/dL — ABNORMAL HIGH (ref 0.3–1.2)
Total Protein: 6.5 g/dL (ref 6.5–8.1)

## 2020-07-01 NOTE — Telephone Encounter (Signed)
Pt called and advised his SOB has improved but he still has some redness on his right arm and wants to know if he should take another round of antibiotics. Pt indicated he just took his last Keflex this morning. Also, pt states his legs are still swollen and has a Cardiology appt next week and wants to know if Dr. Julien Nordmann wants him to be seen sooner.  Discussed these concerns with Dr. Julien Nordmann who advised no further antibiotics are needed at this time and he previously recommended pt see Cardiology.  I called the pt back and advised as indicated. Pt expressed understanding of this information.

## 2020-07-01 NOTE — Telephone Encounter (Signed)
Pt c/o swelling: STAT is pt has developed SOB within 24 hours  1) How much weight have you gained and in what time span?  Over the course of 10 days about 5-6 lbs. Possibly more. States he should weigh 163 and he is weighing in at about 171  2) If swelling, where is the swelling located?  Started from waist down, now just in lower legs - he has been working with his oncologist to try to take care of this for about 10 days   3) Are you currently taking a fluid pill? Been taking 40 mg lasix per day since 11/26  4) Are you currently SOB?  No - has had some SOB but has gotten better with the higher dosage of lasix. He states now it is just when walking more than a short distance or going up stairs   5) Do you have a log of your daily weights (if so, list)? Do not - but states when he first started Lasix it was around 174 and has come down to 171 since then.   6) Have you gained 3 pounds in a day or 5 pounds in a week?  Yes   7) Have you traveled recently? No, but is currently traveling to [place], [place] by car.   He has been working with his oncologist to try to handle the swelling but stated his oncologist asked him this morning to contact his cardiologist instead. Please call/advise  Thank you!

## 2020-07-01 NOTE — Telephone Encounter (Signed)
Spoke with pt who complains of LE edema over the last 10 days.  Pt reports his oncologist started him on Lasix 20mg  without resolution and has since increased dose to 40mg  qam on 11/26.  Pt reports his weight is down from 174 to 171.  Normal weight for pt about 163.  Pt reports he wears compression stockings and has been elevating feet and legs while sitting.  Pt is currently traveling by car to [facility] and will return on Thursday 07/04/2020.  Pt denies current SOB or CP.  Education provided re: limiting Na+ intake.  Appointment scheduled with Oda Kilts, PA-C for 07/05/2020.  Reviewed ED precautions.  Will forward information to Dr Caryl Comes for review and recommendation.  Pt verbalizes understanding and agrees with current plan.

## 2020-07-02 ENCOUNTER — Other Ambulatory Visit: Payer: Medicare Other

## 2020-07-03 ENCOUNTER — Other Ambulatory Visit: Payer: Self-pay | Admitting: Cardiothoracic Surgery

## 2020-07-03 DIAGNOSIS — C3431 Malignant neoplasm of lower lobe, right bronchus or lung: Secondary | ICD-10-CM

## 2020-07-03 NOTE — Progress Notes (Signed)
[address]       [place] [address]             [phone]      Lamarco Mraz [place] Medical Record #[id number] Date of Birth: Jan 01, 1941  Referring: [name], MD Primary Care: [name], MD Primary Cardiologist: No primary care provider on file.   Chief Complaint:   POST OP FOLLOW UP OPERATIVE REPORT DATE OF PROCEDURE:  03/11/2020 PREOPERATIVE DIAGNOSIS:  Lung nodule superior segment right lower lobe. POSTOPERATIVE DIAGNOSIS:  Neuroendocrine tumor by frozen section.  Final path pending. PROCEDURE PERFORMED:  Video bronchoscopy, robotic-assisted right superior segmentectomy with lymph node sampling and intercostal nerve block.  SURGICAL PATHOLOGY  THIS IS AN ADDENDUM REPORT  CASE: [id number]  PATIENT: Alexander Murray  Surgical Pathology Report  Addendum   Reason for Addendum #1: Report Clarification   Clinical History: pulmonary nodule (cm)      FINAL MICROSCOPIC DIAGNOSIS:   A. LYMPH NODE, 8, EXCISION:  - Lymph node, negative for carcinoma (0/1)   B. 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 LYMPH NODE, 10R, EXCISION:  - Lymph node, negative for carcinoma (0/1)   D. LYMPH NODE, 11, EXCISION:  - Fragments of lymph node, negative for carcinoma (0/1)   DIAGNOSIS: Stage IB (T2 a, N0, M0) small cell lung cancer presented with superior segment right lower lobe lung nodule.  PRIOR THERAPY: status post right lower lobe superior segmentectomy with lymph node sampling on March 12, 2020 under the care of Dr. Servando Snare.  CURRENT THERAPY: Systemic chemotherapy with carboplatin for AUC of 5 on day 1 and etoposide 100 mg/M2 on days 1, 2 and 3 with Neulasta support every 3 weeks.  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  He notes that he had tolerated the first 3 cycles of chemo well, with the fourth cycle since he is had increasing difficulty with shortness of breath and lower extremity and abdominal edema.  This is improved slightly with diuretics.  He had a CT scan to rule out pulmonary embolus-none was appreciated he did have small to moderate bilateral pleural effusions       Past Medical History:  Diagnosis Date  . Allergy   . Arthritis    "right knee and right thumb" (12/19/2014)  . Asthma    pt denies  . Bronchitis 07/2016   started in December and continued about 2 months  . Esophageal stricture   . GERD (gastroesophageal reflux disease)   . Hemochromatosis   . Hiatal hernia   . Hypercalcemia   . Hypercholesteremia   . Hypertension   . Internal hemorrhoids   . Lung cancer ([place])   . Osteoarthritis   . Palpitations   . Presence of permanent cardiac pacemaker    hx bradycardia  . Retinal artery occlusion, branch    "right eye"  . Second degree AV block, Mobitz type II   . Skin cancer    right shoulder  . Stroke [facility]) ~ 2011   "right eye"/ partial blindness  . Tubular adenoma of colon  Social History   Tobacco Use  Smoking Status Former Smoker  . Years: 3.00  . Types: Cigarettes  . Quit date: 05/16/1962  . Years since quitting: 58.1  Smokeless Tobacco Never Used  Tobacco Comment   "quit smoking in the 1960's"; was an intermittent smoker     Social History   Substance and Sexual Activity  Alcohol Use No   Comment: 12/19/2014 "stopped drinking in ~ 2012"     No Known Allergies  Current Outpatient Medications  Medication Sig Dispense Refill  . acetaminophen (TYLENOL) 325 MG tablet Take 2 tablets (650 mg total) by mouth every 6 (six) hours as needed for mild pain or moderate pain.    Marland Kitchen  amLODipine (NORVASC) 2.5 MG tablet Take 2.5 mg by mouth at bedtime.    Marland Kitchen aspirin 81 MG tablet Take 81 mg by mouth daily.    . celecoxib (CELEBREX) 200 MG capsule Take 1 capsule (200 mg total) by mouth 2 (two) times daily as needed for mild pain. 10 capsule 0  . chlorthalidone (HYGROTON) 25 MG tablet Take 25 mg by mouth every other day.     . doxazosin (CARDURA) 4 MG tablet Take 4 mg by mouth daily.     Marland Kitchen esomeprazole (NEXIUM) 20 MG packet Take 20 mg by mouth daily before breakfast.    . Misc Natural Products (GLUCOSAMINE CHONDROITIN ADV PO) Take 1,500 mg by mouth at bedtime.     . montelukast (SINGULAIR) 10 MG tablet Take 10 mg by mouth every morning.     . Multiple Vitamin (MULTIVITAMIN) capsule Take 1 capsule by mouth daily.    . Multiple Vitamins-Minerals (PRESERVISION AREDS 2 PO) Take 1 capsule by mouth in the morning and at bedtime.     . simvastatin (ZOCOR) 40 MG tablet Take 40 mg by mouth at bedtime.     . vitamin C (ASCORBIC ACID) 500 MG tablet Take 500 mg by mouth at bedtime.     . furosemide (LASIX) 40 MG tablet Take 1 tablet (40 mg total) by mouth daily. 90 tablet 3  . Glycerin-Hypromellose-PEG 400 (DRY EYE RELIEF DROPS) 0.2-0.2-1 % SOLN Place 1 drop into both eyes daily as needed (Dry eye). Saline    . potassium chloride SA (KLOR-CON) 20 MEQ tablet Take 2 tablets (40 meq total) ONCE, then take 1 tablet (20 meq total) daily along with your lasix. 34 tablet 6  . rivaroxaban (XARELTO) 20 MG TABS tablet Take 1 tablet (20 mg total) by mouth daily with supper. 30 tablet 11   No current facility-administered medications for this visit.       Physical Exam: BP 123/69   Pulse 65   Temp 97.6 F (36.4 C) (Skin)   Resp 20   Ht 5\' 10"  (1.778 m)   Wt 173 lb (78.5 kg)   SpO2 90% Comment: RA  BMI 24.82 kg/m   General appearance: alert, cooperative and no distress Head: Normocephalic, without obvious abnormality, atraumatic Neck: no adenopathy, no carotid bruit, no JVD, supple,  symmetrical, trachea midline and thyroid not enlarged, symmetric, no tenderness/mass/nodules Lymph nodes: Cervical, supraclavicular, and axillary nodes normal. Resp: diminished breath sounds bibasilar Cardio: regular rate and rhythm, S1, S2 normal, no murmur, click, rub or gallop GI: soft, non-tender; bowel sounds normal; no masses,  no organomegaly Extremities: edema Mild bilateral pedal edema patient notes much improved from 2 weeks ago Neurologic: Grossly normal   Diagnostic Studies & Laboratory data:     Recent Radiology Findings:  DG Chest 2  View  Result Date: 06/26/2020 CLINICAL DATA:  Worsening shortness of breath and nonproductive cause. EXAM: CHEST - 2 VIEW COMPARISON:  Multiple priors, most recent 03/28/2020. FINDINGS: Chronic streaky right mid lung and bibasilar opacities. No visible pleural effusions or pneumothorax. Similar cardiomediastinal silhouette. Pericardial calcification. Similar position of a left subclavian approach cardiac rhythm maintenance. No acute osseous abnormality. Chronic lower thoracic compression fracture. Polyarticular degenerative change. IMPRESSION: Chronic streaky right mid lung and bibasilar opacities, favored to reflect atelectasis/scar. No new focal consolidation. Electronically Signed   By: Margaretha Sheffield MD   On: 06/26/2020 13:33   CT ANGIO CHEST PE W OR WO CONTRAST  Result Date: 06/26/2020 CLINICAL DATA:  Chest pain, shortness of breath. History of small-cell lung cancer 8/upper lobe. 21 status post right lower lobectomy and chemotherapy. Difficulty breathing and chest pain for 1 week. EXAM: CT ANGIOGRAPHY CHEST WITH CONTRAST TECHNIQUE: Multidetector CT imaging of the chest was performed using the Murray protocol during bolus administration of intravenous contrast. Multiplanar CT image reconstructions and MIPs were obtained to evaluate the vascular anatomy. CONTRAST:  125mL OMNIPAQUE IOHEXOL 350 MG/ML SOLN COMPARISON:  CT abdomen pelvis 05/16/2020,  PET CT 02/09/2020 FINDINGS: Lines and tubes: Left chest wall 2 lead cardiac pacemaker with leads terminating in the right ventricle and right atrium. Cardiovascular: Satisfactory opacification of the pulmonary arteries to the segmental level. No evidence of pulmonary embolism. The main pulmonary artery is normal in caliber. Normal heart size. Redemonstration of dense pericardial calcifications no associated significant pericardial fluid. Mild aortic valve leaflet calcifications. Stable ascending thoracic aorta caliber measuring up to 4.3 cm on axial imaging (4:45). The descending thoracic aorta is normal in caliber. Moderate atherosclerotic plaque of the aorta and its major branches. At least moderate four-vessel coronary artery calcifications. Mediastinum/Nodes: Several prominent but no definite enlarged mediastinal lymph nodes. No enlarged mediastinal, hilar, or axillary lymph nodes. Thyroid gland, trachea, and esophagus demonstrate no significant findings. Lungs/Pleura: Limited evaluation of the lung parenchyma due to motion artifact. Status post right lower lobectomy. Expiratory phase of respiration. Linear atelectasis versus scarring within the right upper lobe. No focal consolidation. No definite pulmonary nodule or mass. Interval increase in a trace to small volume right pleural effusion. Interval development of a trace to small volume left pleural effusion. No pneumothorax. Upper Abdomen: Severe atherosclerotic plaque of the abdominal aorta. Suggestion of a similar appearing focal suprarenal abdominal aorta dissection (4:113). Musculoskeletal: No chest wall abnormality No suspicious lytic or blastic osseous lesions. No acute displaced fracture. Multilevel degenerative changes of the spine. Chronic appearing mild T7 vertebral body compression fracture. Chronic appearing T11 compression fracture with greater than 80% height loss. Review of the MIP images confirms the above findings. IMPRESSION: 1.  No  pulmonary embolus. 2. Interval increase in size of a small right pleural effusion. Interval development of a left trace to small pleural effusion. 3. Chronic appearing T7 and T11 compression fractures. Correlate with midline tenderness to palpation to evaluate for an acute component. 4. Chronic pericardial calcification. 5. Stable ascending aorta aneurysm measuring up to 4.3 cm. Possible stable focal chronic suprarenal abdominal aorta dissection. 6.  Aortic Atherosclerosis (ICD10-I70.0). Electronically Signed   By: Iven Finn M.D.   On: 06/26/2020 18:20   CUP PACEART INCLINIC DEVICE CHECK  Result Date: 07/05/2020 Pacemaker check in clinic. Normal device function. Thresholds, sensing, impedances consistent with previous measurements. . New persistent AF noted. Ongoing episode since 11/21. [place] started today. Rates overall well controlled. Device programmed at appropriate safety margins. Histogram distribution  appropriate for patient activity level.  Estimated longevity 2 yr, 11 mo. Patient enrolled in remote follow-up. Patient education completed.  I have independently reviewed the above radiology studies  and reviewed the findings with the patient.    Recent Lab Findings: Lab Results  Component Value Date   WBC 5.7 07/05/2020   HGB 11.2 (L) 07/05/2020   HCT 32.9 (L) 07/05/2020   PLT 169 07/05/2020   GLUCOSE 133 (H) 07/05/2020   ALT 20 07/01/2020   AST 32 07/01/2020   NA 139 07/05/2020   K 3.4 (L) 07/05/2020   CL 101 07/05/2020   CREATININE 0.92 07/05/2020   BUN 13 07/05/2020   CO2 28 07/05/2020   TSH 1.95 12/05/2014   INR 1.2 03/11/2020      Assessment / Plan:   #1 status post superior segment ectomy right lower lobe-4 small cell carcinoma of the lung now has completed 4 cycles of chemo #2 evidence of increasing heart failure symptoms-patient has had a history of atrial fibrillation in the past also has known calcified pericardium-he is to be seen by cardiology tomorrow for  further evaluation. #3 with recent CT scan of the chest-discussed with medical oncology is CT scheduled for next week will be canceled, he will on his next visit to medical oncology have a chest x-ray to evaluate the effusions    Medication Changes: No orders of the defined types were placed in this encounter.     Grace Isaac MD      [address] [place] [address] Office [phone]     07/08/2020 1:15 PM

## 2020-07-04 ENCOUNTER — Ambulatory Visit (INDEPENDENT_AMBULATORY_CARE_PROVIDER_SITE_OTHER): Payer: Medicare Other | Admitting: Cardiothoracic Surgery

## 2020-07-04 ENCOUNTER — Encounter: Payer: Self-pay | Admitting: *Deleted

## 2020-07-04 ENCOUNTER — Other Ambulatory Visit: Payer: Self-pay

## 2020-07-04 ENCOUNTER — Other Ambulatory Visit: Payer: Self-pay | Admitting: Physician Assistant

## 2020-07-04 ENCOUNTER — Telehealth: Payer: Self-pay | Admitting: *Deleted

## 2020-07-04 VITALS — BP 123/69 | HR 65 | Temp 97.6°F | Resp 20 | Ht 70.0 in | Wt 173.0 lb

## 2020-07-04 DIAGNOSIS — C3431 Malignant neoplasm of lower lobe, right bronchus or lung: Secondary | ICD-10-CM

## 2020-07-04 DIAGNOSIS — Z09 Encounter for follow-up examination after completed treatment for conditions other than malignant neoplasm: Secondary | ICD-10-CM | POA: Diagnosis not present

## 2020-07-04 NOTE — Progress Notes (Signed)
I received a message from Dr. Servando Snare.  He was wondering if [name] could not get ct scan but get cxr before patient comes to see Dr. Julien Nordmann.  I updated Dr. Julien Nordmann and he is ok with plan of care.  I will call radiology to cancel and schedule cxr.

## 2020-07-04 NOTE — Telephone Encounter (Signed)
I called [name] with an update that his ct scan has been cancelled but he will do a walk in to the [facility] long radiology dept for a chest x ray.  He verbalized understanding.  I will follow up with Dr. Julien Nordmann to see if patient needs labs the day of his appt with Dr. Julien Nordmann.

## 2020-07-05 ENCOUNTER — Ambulatory Visit (INDEPENDENT_AMBULATORY_CARE_PROVIDER_SITE_OTHER): Payer: Medicare Other | Admitting: Student

## 2020-07-05 ENCOUNTER — Other Ambulatory Visit: Payer: Self-pay | Admitting: Student

## 2020-07-05 ENCOUNTER — Encounter: Payer: Self-pay | Admitting: Student

## 2020-07-05 ENCOUNTER — Other Ambulatory Visit (HOSPITAL_COMMUNITY): Payer: Medicare Other

## 2020-07-05 ENCOUNTER — Encounter: Payer: Self-pay | Admitting: *Deleted

## 2020-07-05 VITALS — BP 116/68 | HR 83 | Ht 70.0 in | Wt 172.0 lb

## 2020-07-05 DIAGNOSIS — I441 Atrioventricular block, second degree: Secondary | ICD-10-CM | POA: Diagnosis not present

## 2020-07-05 DIAGNOSIS — R0609 Other forms of dyspnea: Secondary | ICD-10-CM

## 2020-07-05 DIAGNOSIS — Z950 Presence of cardiac pacemaker: Secondary | ICD-10-CM

## 2020-07-05 DIAGNOSIS — Z79899 Other long term (current) drug therapy: Secondary | ICD-10-CM | POA: Diagnosis not present

## 2020-07-05 DIAGNOSIS — R0600 Dyspnea, unspecified: Secondary | ICD-10-CM | POA: Diagnosis not present

## 2020-07-05 DIAGNOSIS — R001 Bradycardia, unspecified: Secondary | ICD-10-CM | POA: Diagnosis not present

## 2020-07-05 DIAGNOSIS — I4819 Other persistent atrial fibrillation: Secondary | ICD-10-CM

## 2020-07-05 LAB — CBC
Hematocrit: 32.9 % — ABNORMAL LOW (ref 37.5–51.0)
Hemoglobin: 11.2 g/dL — ABNORMAL LOW (ref 13.0–17.7)
MCH: 33.2 pg — ABNORMAL HIGH (ref 26.6–33.0)
MCHC: 34 g/dL (ref 31.5–35.7)
MCV: 98 fL — ABNORMAL HIGH (ref 79–97)
Platelets: 169 10*3/uL (ref 150–450)
RBC: 3.37 x10E6/uL — ABNORMAL LOW (ref 4.14–5.80)
RDW: 17.2 % — ABNORMAL HIGH (ref 11.6–15.4)
WBC: 5.7 10*3/uL (ref 3.4–10.8)

## 2020-07-05 LAB — CUP PACEART INCLINIC DEVICE CHECK
Battery Remaining Longevity: 35 mo
Battery Voltage: 2.96 V
Brady Statistic AP VP Percent: 34.3 %
Brady Statistic AP VS Percent: 0 %
Brady Statistic AS VP Percent: 65.31 %
Brady Statistic AS VS Percent: 0.38 %
Brady Statistic RA Percent Paced: 30.74 %
Brady Statistic RV Percent Paced: 99.66 %
Date Time Interrogation Session: 20211203103600
Implantable Lead Implant Date: 20160518
Implantable Lead Implant Date: 20160518
Implantable Lead Location: 753859
Implantable Lead Location: 753860
Implantable Lead Model: 5076
Implantable Lead Model: 5076
Implantable Pulse Generator Implant Date: 20160518
Lead Channel Impedance Value: 304 Ohm
Lead Channel Impedance Value: 361 Ohm
Lead Channel Impedance Value: 437 Ohm
Lead Channel Impedance Value: 456 Ohm
Lead Channel Pacing Threshold Amplitude: 0.625 V
Lead Channel Pacing Threshold Amplitude: 0.75 V
Lead Channel Pacing Threshold Pulse Width: 0.4 ms
Lead Channel Pacing Threshold Pulse Width: 0.4 ms
Lead Channel Sensing Intrinsic Amplitude: 3.625 mV
Lead Channel Sensing Intrinsic Amplitude: 5.125 mV
Lead Channel Sensing Intrinsic Amplitude: 7.25 mV
Lead Channel Sensing Intrinsic Amplitude: 7.625 mV
Lead Channel Setting Pacing Amplitude: 1.5 V
Lead Channel Setting Pacing Amplitude: 2.5 V
Lead Channel Setting Pacing Pulse Width: 0.4 ms
Lead Channel Setting Sensing Sensitivity: 4 mV

## 2020-07-05 LAB — BASIC METABOLIC PANEL
BUN/Creatinine Ratio: 14 (ref 10–24)
BUN: 13 mg/dL (ref 8–27)
CO2: 28 mmol/L (ref 20–29)
Calcium: 10.1 mg/dL (ref 8.6–10.2)
Chloride: 101 mmol/L (ref 96–106)
Creatinine, Ser: 0.92 mg/dL (ref 0.76–1.27)
GFR calc Af Amer: 90 mL/min/{1.73_m2} (ref >59)
GFR calc non Af Amer: 78 mL/min/{1.73_m2} (ref >59)
Glucose: 133 mg/dL — ABNORMAL HIGH (ref 65–99)
Potassium: 3.4 mmol/L — ABNORMAL LOW (ref 3.5–5.2)
Sodium: 139 mmol/L (ref 134–144)

## 2020-07-05 MED ORDER — RIVAROXABAN 20 MG PO TABS: 20.0000 mg | ORAL_TABLET | Freq: Every day | ORAL | 11 refills | Status: AC

## 2020-07-05 MED ORDER — POTASSIUM CHLORIDE CRYS ER 20 MEQ PO TBCR: EXTENDED_RELEASE_TABLET | ORAL | 6 refills | Status: DC

## 2020-07-05 MED ORDER — FUROSEMIDE 40 MG PO TABS: 40.0000 mg | ORAL_TABLET | Freq: Every day | ORAL | 3 refills | Status: DC

## 2020-07-05 NOTE — Progress Notes (Signed)
Electrophysiology Office Note Date: 07/05/2020  ID:  Alexander Murray, DOB 14-Jun-1941, MRN [id number]  PCP: [name], MD Primary Cardiologist: No primary care provider on file. Electrophysiologist: [name], MD   CC: Pacemaker follow-up  Alexander Murray is a 79 y.o. male seen today for [name], MD for routine electrophysiology followup.  Since last being seen in our clinic the patient reports DOE and peripheral edema while undergoing chemotherapy for small cell lung CA. He initially tolerate chemotherapy well. Most recent session approx 3-4 weeks ago.  After this session he had significant peripheral and abdominal edema that has now improved on lasix. He has also had dyspnea with mild to moderate exertion. This has been worse over the past 2 weeks, and has not improved with improvement of his edema.   Device History: Medtronic Dual Chamber PPM implanted 2106 for symptomatic high grade heart block  DATE TEST EF   4/16 Myoview    % No Ischemia  4/16 Echo   55-60 % LAE mild (42/3.2/24)            Past Medical History:  Diagnosis Date  . Allergy   . Arthritis    "right knee and right thumb" (12/19/2014)  . Asthma    pt denies  . Bronchitis 07/2016   started in December and continued about 2 months  . Esophageal stricture   . GERD (gastroesophageal reflux disease)   . Hemochromatosis   . Hiatal hernia   . Hypercalcemia   . Hypercholesteremia   . Hypertension   . Internal hemorrhoids   . Lung cancer ([place])   . Osteoarthritis   . Palpitations   . Presence of permanent cardiac pacemaker    hx bradycardia  . Retinal artery occlusion, branch    "right eye"  . Second degree AV block, Mobitz type II   . Skin cancer    right shoulder  . Stroke [facility]) ~ 2011   "right eye"/ partial blindness  . Tubular adenoma of colon    Past Surgical History:  Procedure Laterality Date  . CATARACT EXTRACTION, BILATERAL  01/2017  . COLONOSCOPY    . EP IMPLANTABLE DEVICE  N/A 12/19/2014   Procedure: Pacemaker Implant;  Surgeon: Deboraha Sprang, MD;  Location: [facility];  Service: Cardiovascular;  Laterality: N/A;  . ESOPHAGOGASTRODUODENOSCOPY (EGD) WITH ESOPHAGEAL DILATION  X 3  . EYE SURGERY Bilateral 2017  . INGUINAL HERNIA REPAIR Left 1980's  . INSERT / REPLACE / REMOVE PACEMAKER  12/19/2014  . INTERCOSTAL NERVE BLOCK Right 03/11/2020   Procedure: INTERCOSTAL NERVE BLOCK;  Surgeon: Grace Isaac, MD;  Location: [facility];  Service: Thoracic;  Laterality: Right;  . JOINT REPLACEMENT Right 2016  . KNEE ARTHROSCOPY Right ~ 1982; ~ 1992  . MOHS SURGERY Right ~ 2014   "pre-melanoma scapula"  . POSTERIOR TIBIAL TENDON REPAIR Left 2012  . TONSILLECTOMY  1946  . TOTAL KNEE ARTHROPLASTY Right 05/20/2015   Procedure: RIGHT TOTAL KNEE ARTHROPLASTY;  Surgeon: Paralee Cancel, MD;  Location: [facility];  Service: Orthopedics;  Laterality: Right;  . UPPER GASTROINTESTINAL ENDOSCOPY    . VIDEO BRONCHOSCOPY N/A 03/11/2020   Procedure: VIDEO BRONCHOSCOPY;  Surgeon: Grace Isaac, MD;  Location: [facility];  Service: Thoracic;  Laterality: N/A;  . XI ROBOTIC ASSISTED THORACOSCOPY- SEGMENTECTOMY Right 03/11/2020   Procedure: XI ROBOTIC ASSISTED THORACOSCOPY-RIGHT SUPERIOR SEGMENTECTOMY WITH NODE SAMPLES;  Surgeon: Grace Isaac, MD;  Location: [facility];  Service: Thoracic;  Laterality: Right;  Current Outpatient Medications  Medication Sig Dispense Refill  . acetaminophen (TYLENOL) 325 MG tablet Take 2 tablets (650 mg total) by mouth every 6 (six) hours as needed for mild pain or moderate pain.    Marland Kitchen amLODipine (NORVASC) 2.5 MG tablet Take 2.5 mg by mouth at bedtime.    Marland Kitchen aspirin 81 MG tablet Take 81 mg by mouth daily.    . celecoxib (CELEBREX) 200 MG capsule Take 1 capsule (200 mg total) by mouth 2 (two) times daily as needed for mild pain. 10 capsule 0  . chlorthalidone (HYGROTON) 25 MG tablet Take 25 mg by mouth every other day.     . doxazosin (CARDURA) 4 MG tablet  Take 4 mg by mouth daily.     Marland Kitchen esomeprazole (NEXIUM) 20 MG packet Take 20 mg by mouth daily before breakfast.    . Misc Natural Products (GLUCOSAMINE CHONDROITIN ADV PO) Take 1,500 mg by mouth daily.    . montelukast (SINGULAIR) 10 MG tablet Take 10 mg by mouth every morning.     . Multiple Vitamin (MULTIVITAMIN) capsule Take 1 capsule by mouth daily.    . Multiple Vitamins-Minerals (PRESERVISION AREDS 2 PO) Take 1 capsule by mouth daily.    . simvastatin (ZOCOR) 40 MG tablet Take 40 mg by mouth daily.    . vitamin C (ASCORBIC ACID) 500 MG tablet Take 500 mg by mouth daily.     . furosemide (LASIX) 40 MG tablet Take 1 tablet (40 mg total) by mouth daily. 90 tablet 3  . rivaroxaban (XARELTO) 20 MG TABS tablet Take 1 tablet (20 mg total) by mouth daily with supper. 30 tablet 11   No current facility-administered medications for this visit.    Allergies:   Patient has no known allergies.   Social History: Social History   Socioeconomic History  . Marital status: Married    Spouse name: Not on file  . Number of children: Not on file  . Years of education: Not on file  . Highest education level: Not on file  Occupational History  . Occupation: retired - Doctor, general practice  Tobacco Use  . Smoking status: Former Smoker    Years: 3.00    Types: Cigarettes    Quit date: 05/16/1962    Years since quitting: 58.1  . Smokeless tobacco: Never Used  . Tobacco comment: "quit smoking in the 1960's"; was an intermittent smoker   Vaping Use  . Vaping Use: Never used  Substance and Sexual Activity  . Alcohol use: No    Comment: 12/19/2014 "stopped drinking in ~ 2012"  . Drug use: No  . Sexual activity: Yes  Other Topics Concern  . Not on file  Social History Narrative  . Not on file   Social Determinants of Health   Financial Resource Strain:   . Difficulty of Paying Living Expenses: Not on file  Food Insecurity:   . Worried About Charity fundraiser in the Last Year: Not on file  .  Ran Out of Food in the Last Year: Not on file  Transportation Needs:   . Lack of Transportation (Medical): Not on file  . Lack of Transportation (Non-Medical): Not on file  Physical Activity:   . Days of Exercise per Week: Not on file  . Minutes of Exercise per Session: Not on file  Stress:   . Feeling of Stress : Not on file  Social Connections:   . Frequency of Communication with Friends and Family: Not on file  .  Frequency of Social Gatherings with Friends and Family: Not on file  . Attends Religious Services: Not on file  . Active Member of Clubs or Organizations: Not on file  . Attends Archivist Meetings: Not on file  . Marital Status: Not on file  Intimate Partner Violence:   . Fear of Current or Ex-Partner: Not on file  . Emotionally Abused: Not on file  . Physically Abused: Not on file  . Sexually Abused: Not on file    Family History: Family History  Problem Relation Age of Onset  . Colon cancer Mother        dx in her 42s  . Heart disease Mother   . Alzheimer's disease Mother   . Colon cancer Father        dx in his early 85s  . Heart disease Father   . Prostate cancer Father   . Prostate cancer Brother        1/2 brother  . Hemochromatosis Brother   . Prostate cancer Brother   . Esophageal cancer Neg Hx   . Pancreatic cancer Neg Hx   . Stomach cancer Neg Hx   . Liver disease Neg Hx      Review of Systems: All other systems reviewed and are otherwise negative except as noted above.  Physical Exam: Vitals:   07/05/20 0836  BP: 116/68  Pulse: 83  SpO2: 98%  Weight: 172 lb (78 kg)  Height: 5\' 10"  (1.778 m)     GEN- The patient is well appearing, alert and oriented x 3 today.   HEENT: normocephalic, atraumatic; sclera clear, conjunctiva pink; hearing intact; oropharynx clear; neck supple  Lungs- Clear to ausculation bilaterally, normal work of breathing.  No wheezes, rales, rhonchi Heart- Regular rate and rhythm, no murmurs, rubs or  gallops  GI- soft, non-tender, non-distended, bowel sounds present  Extremities- no clubbing or cyanosis. No edema MS- no significant deformity or atrophy Skin- warm and dry, no rash or lesion; PPM pocket well healed Psych- euthymic mood, full affect Neuro- strength and sensation are intact  PPM Interrogation- reviewed in detail today,  See PACEART report  EKG:  EKG is ordered today. The ekg ordered today shows underlying AF with controlled ventricular rate at 68 bpm  Recent Labs: 07/01/2020: ALT 20; BUN 17; Creatinine 0.98; Hemoglobin 11.4; Platelet Count 136; Potassium 3.8; Sodium 140   Wt Readings from Last 3 Encounters:  07/05/20 172 lb (78 kg)  07/04/20 173 lb (78.5 kg)  06/26/20 173 lb 11.2 oz (78.8 kg)     Other studies Reviewed: Additional studies/ records that were reviewed today include: Previous EP office notes, Previous remote checks, Most recent labwork.   Assessment and Plan:  1. Advanced AV block s/p Medtronic PPM  Normal PPM function See Pace Art report No changes today  2. SCAF -> now persistent atrial fibrillation Pt has now had multiple episodes of prolonged AF. 30+hrs, 80+hrs, and currently in an AF episode since 11/21 (12 days +).  Start Xarelto 20 mg daily (Eliquis not on his insurance formulary). STOP plavix.  Will schedule for TEE/DCC next week due to NYHA III symptoms  May need to consider AAD once pt is anticoagulated and TEE  3. Volume overload / Chronic ?diastolic CHF LVEF 01/3709 62-69%. Will update with TEE as above, but suspect symptoms in setting of loss of A/V synchrony with AF. Continue lasix 40 mg daily for now. Repeat labs today. Suspect he will not need lasix as often once  NSR restored.   4. HTN No change today.   5. Small cell lung cancer S/p Video bronchoscopy, robotic-assisted right superior segmentectomy with lymph node sampling and intercostal nerve block 03/11/2020 Pt is undergoing systemic chemotherapy with carboplatin for AUC of  5 on day 1 and etoposide 100 mg/M2 on days 1, 2 and 3 with Neulasta support every 3 weeks. Status post 3cycles His chemotherapy has been complicated by volume overload trialing on lasix as above.  6. H/o CRAO Continue ASA for now.   Current medicines are reviewed at length with the patient today.   The patient does not have concerns regarding his medicines.  The following changes were made today:  STOP PLAVIX. Start Xarelto. Plan TEE/DCC next week.   Labs/ tests ordered today include:  Orders Placed This Encounter  Procedures  . Basic metabolic panel  . CBC  . EKG 12-Lead   Disposition:   Follow up with Dr. Caryl Comes or EP APP 1-2 weeks post [facility].   Jacalyn Lefevre, PA-C  07/05/2020 10:28 AM  [place] [address] [phone] (office) [phone] (fax)

## 2020-07-05 NOTE — Patient Instructions (Addendum)
Medication Instructions:  1) Stop Plavix   2) Start Xarelto 20 mg daily    3) Increase Lasix to 40 mg daily   *If you need a refill on your cardiac medications before your next appointment, please call your pharmacy*   Lab Work: [place], [place] are scheduled for your covid test on 07/10/2020, this is a drive through so please remain in your car and someone will direct you to the correct line. You will be instructed to wear a mask and quarantine until the day of your procedure   If you have labs (blood work) drawn today and your tests are completely normal, you will receive your results only by: Marland Kitchen MyChart Message (if you have MyChart) OR . A paper copy in the mail If you have any lab test that is abnormal or we need to change your treatment, we will call you to review the results.   Testing/Procedures: Your physician has recommended that you have a Cardioversion (DCCV). Electrical Cardioversion uses a jolt of electricity to your heart either through paddles or wired patches attached to your chest. This is a controlled, usually prescheduled, procedure. Defibrillation is done under light anesthesia in the hospital, and you usually go home the day of the procedure. This is done to get your heart back into a normal rhythm. You are not awake for the procedure. Please see the instruction sheet given to you today.    Follow-Up: You are schedule to see Oda Kilts, PA-C on 07/23/20 @ 9:40 AM   Other Instructions  You are scheduled for a TEE/Cardioversion/TEE Cardioversion on 07/12/20 with Dr. Johnsie Cancel.  Please arrive at the [facility] (Main Entrance A) at [facility]: [address] [place], [place] [address] at 9:00 am  DIET: Nothing to eat or drink after midnight except a sip of water with medications (see medication instructions below)  Medication Instructions: Hold Furosemide the morning of your procedure   Continue your anticoagulant:  You will need to continue your  anticoagulant after your procedure until you are told by your provider that it is safe to stop  You must have a responsible person to drive you home and stay in the waiting area during your procedure. Failure to do so could result in cancellation.  Bring your insurance cards.  *Special Note: Every effort is made to have your procedure done on time. Occasionally there are emergencies that occur at the hospital that may cause delays. Please be patient if a delay does occur.

## 2020-07-05 NOTE — H&P (View-Only) (Signed)
Electrophysiology Office Note Date: 07/05/2020  ID:  Alexander Murray, DOB March 14, 1941, MRN [id number]  PCP: [name], MD Primary Cardiologist: No primary care provider on file. Electrophysiologist: [name], MD   CC: Pacemaker follow-up  Alexander Murray is a 79 y.o. male seen today for [name], MD for routine electrophysiology followup.  Since last being seen in our clinic the patient reports DOE and peripheral edema while undergoing chemotherapy for small cell lung CA. He initially tolerate chemotherapy well. Most recent session approx 3-4 weeks ago.  After this session he had significant peripheral and abdominal edema that has now improved on lasix. He has also had dyspnea with mild to moderate exertion. This has been worse over the past 2 weeks, and has not improved with improvement of his edema.   Device History: Medtronic Dual Chamber PPM implanted 2106 for symptomatic high grade heart block  DATE TEST EF   4/16 Myoview    % No Ischemia  4/16 Echo   55-60 % LAE mild (42/3.2/24)            Past Medical History:  Diagnosis Date  . Allergy   . Arthritis    "right knee and right thumb" (12/19/2014)  . Asthma    pt denies  . Bronchitis 07/2016   started in December and continued about 2 months  . Esophageal stricture   . GERD (gastroesophageal reflux disease)   . Hemochromatosis   . Hiatal hernia   . Hypercalcemia   . Hypercholesteremia   . Hypertension   . Internal hemorrhoids   . Lung cancer ([place])   . Osteoarthritis   . Palpitations   . Presence of permanent cardiac pacemaker    hx bradycardia  . Retinal artery occlusion, branch    "right eye"  . Second degree AV block, Mobitz type II   . Skin cancer    right shoulder  . Stroke [facility]) ~ 2011   "right eye"/ partial blindness  . Tubular adenoma of colon    Past Surgical History:  Procedure Laterality Date  . CATARACT EXTRACTION, BILATERAL  01/2017  . COLONOSCOPY    . EP IMPLANTABLE DEVICE  N/A 12/19/2014   Procedure: Pacemaker Implant;  Surgeon: Deboraha Sprang, MD;  Location: [facility];  Service: Cardiovascular;  Laterality: N/A;  . ESOPHAGOGASTRODUODENOSCOPY (EGD) WITH ESOPHAGEAL DILATION  X 3  . EYE SURGERY Bilateral 2017  . INGUINAL HERNIA REPAIR Left 1980's  . INSERT / REPLACE / REMOVE PACEMAKER  12/19/2014  . INTERCOSTAL NERVE BLOCK Right 03/11/2020   Procedure: INTERCOSTAL NERVE BLOCK;  Surgeon: Grace Isaac, MD;  Location: [facility];  Service: Thoracic;  Laterality: Right;  . JOINT REPLACEMENT Right 2016  . KNEE ARTHROSCOPY Right ~ 1982; ~ 1992  . MOHS SURGERY Right ~ 2014   "pre-melanoma scapula"  . POSTERIOR TIBIAL TENDON REPAIR Left 2012  . TONSILLECTOMY  1946  . TOTAL KNEE ARTHROPLASTY Right 05/20/2015   Procedure: RIGHT TOTAL KNEE ARTHROPLASTY;  Surgeon: Paralee Cancel, MD;  Location: [facility];  Service: Orthopedics;  Laterality: Right;  . UPPER GASTROINTESTINAL ENDOSCOPY    . VIDEO BRONCHOSCOPY N/A 03/11/2020   Procedure: VIDEO BRONCHOSCOPY;  Surgeon: Grace Isaac, MD;  Location: [facility];  Service: Thoracic;  Laterality: N/A;  . XI ROBOTIC ASSISTED THORACOSCOPY- SEGMENTECTOMY Right 03/11/2020   Procedure: XI ROBOTIC ASSISTED THORACOSCOPY-RIGHT SUPERIOR SEGMENTECTOMY WITH NODE SAMPLES;  Surgeon: Grace Isaac, MD;  Location: [facility];  Service: Thoracic;  Laterality: Right;  Current Outpatient Medications  Medication Sig Dispense Refill  . acetaminophen (TYLENOL) 325 MG tablet Take 2 tablets (650 mg total) by mouth every 6 (six) hours as needed for mild pain or moderate pain.    Marland Kitchen amLODipine (NORVASC) 2.5 MG tablet Take 2.5 mg by mouth at bedtime.    Marland Kitchen aspirin 81 MG tablet Take 81 mg by mouth daily.    . celecoxib (CELEBREX) 200 MG capsule Take 1 capsule (200 mg total) by mouth 2 (two) times daily as needed for mild pain. 10 capsule 0  . chlorthalidone (HYGROTON) 25 MG tablet Take 25 mg by mouth every other day.     . doxazosin (CARDURA) 4 MG tablet  Take 4 mg by mouth daily.     Marland Kitchen esomeprazole (NEXIUM) 20 MG packet Take 20 mg by mouth daily before breakfast.    . Misc Natural Products (GLUCOSAMINE CHONDROITIN ADV PO) Take 1,500 mg by mouth daily.    . montelukast (SINGULAIR) 10 MG tablet Take 10 mg by mouth every morning.     . Multiple Vitamin (MULTIVITAMIN) capsule Take 1 capsule by mouth daily.    . Multiple Vitamins-Minerals (PRESERVISION AREDS 2 PO) Take 1 capsule by mouth daily.    . simvastatin (ZOCOR) 40 MG tablet Take 40 mg by mouth daily.    . vitamin C (ASCORBIC ACID) 500 MG tablet Take 500 mg by mouth daily.     . furosemide (LASIX) 40 MG tablet Take 1 tablet (40 mg total) by mouth daily. 90 tablet 3  . rivaroxaban (XARELTO) 20 MG TABS tablet Take 1 tablet (20 mg total) by mouth daily with supper. 30 tablet 11   No current facility-administered medications for this visit.    Allergies:   Patient has no known allergies.   Social History: Social History   Socioeconomic History  . Marital status: Married    Spouse name: Not on file  . Number of children: Not on file  . Years of education: Not on file  . Highest education level: Not on file  Occupational History  . Occupation: retired - Doctor, general practice  Tobacco Use  . Smoking status: Former Smoker    Years: 3.00    Types: Cigarettes    Quit date: 05/16/1962    Years since quitting: 58.1  . Smokeless tobacco: Never Used  . Tobacco comment: "quit smoking in the 1960's"; was an intermittent smoker   Vaping Use  . Vaping Use: Never used  Substance and Sexual Activity  . Alcohol use: No    Comment: 12/19/2014 "stopped drinking in ~ 2012"  . Drug use: No  . Sexual activity: Yes  Other Topics Concern  . Not on file  Social History Narrative  . Not on file   Social Determinants of Health   Financial Resource Strain:   . Difficulty of Paying Living Expenses: Not on file  Food Insecurity:   . Worried About Charity fundraiser in the Last Year: Not on file  .  Ran Out of Food in the Last Year: Not on file  Transportation Needs:   . Lack of Transportation (Medical): Not on file  . Lack of Transportation (Non-Medical): Not on file  Physical Activity:   . Days of Exercise per Week: Not on file  . Minutes of Exercise per Session: Not on file  Stress:   . Feeling of Stress : Not on file  Social Connections:   . Frequency of Communication with Friends and Family: Not on file  .  Frequency of Social Gatherings with Friends and Family: Not on file  . Attends Religious Services: Not on file  . Active Member of Clubs or Organizations: Not on file  . Attends Archivist Meetings: Not on file  . Marital Status: Not on file  Intimate Partner Violence:   . Fear of Current or Ex-Partner: Not on file  . Emotionally Abused: Not on file  . Physically Abused: Not on file  . Sexually Abused: Not on file    Family History: Family History  Problem Relation Age of Onset  . Colon cancer Mother        dx in her 47s  . Heart disease Mother   . Alzheimer's disease Mother   . Colon cancer Father        dx in his early 37s  . Heart disease Father   . Prostate cancer Father   . Prostate cancer Brother        1/2 brother  . Hemochromatosis Brother   . Prostate cancer Brother   . Esophageal cancer Neg Hx   . Pancreatic cancer Neg Hx   . Stomach cancer Neg Hx   . Liver disease Neg Hx      Review of Systems: All other systems reviewed and are otherwise negative except as noted above.  Physical Exam: Vitals:   07/05/20 0836  BP: 116/68  Pulse: 83  SpO2: 98%  Weight: 172 lb (78 kg)  Height: 5\' 10"  (1.778 m)     GEN- The patient is well appearing, alert and oriented x 3 today.   HEENT: normocephalic, atraumatic; sclera clear, conjunctiva pink; hearing intact; oropharynx clear; neck supple  Lungs- Clear to ausculation bilaterally, normal work of breathing.  No wheezes, rales, rhonchi Heart- Regular rate and rhythm, no murmurs, rubs or  gallops  GI- soft, non-tender, non-distended, bowel sounds present  Extremities- no clubbing or cyanosis. No edema MS- no significant deformity or atrophy Skin- warm and dry, no rash or lesion; PPM pocket well healed Psych- euthymic mood, full affect Neuro- strength and sensation are intact  PPM Interrogation- reviewed in detail today,  See PACEART report  EKG:  EKG is ordered today. The ekg ordered today shows underlying AF with controlled ventricular rate at 68 bpm  Recent Labs: 07/01/2020: ALT 20; BUN 17; Creatinine 0.98; Hemoglobin 11.4; Platelet Count 136; Potassium 3.8; Sodium 140   Wt Readings from Last 3 Encounters:  07/05/20 172 lb (78 kg)  07/04/20 173 lb (78.5 kg)  06/26/20 173 lb 11.2 oz (78.8 kg)     Other studies Reviewed: Additional studies/ records that were reviewed today include: Previous EP office notes, Previous remote checks, Most recent labwork.   Assessment and Plan:  1. Advanced AV block s/p Medtronic PPM  Normal PPM function See Pace Art report No changes today  2. SCAF -> now persistent atrial fibrillation Pt has now had multiple episodes of prolonged AF. 30+hrs, 80+hrs, and currently in an AF episode since 11/21 (12 days +).  Start Xarelto 20 mg daily (Eliquis not on his insurance formulary). STOP plavix.  Will schedule for TEE/DCC next week due to NYHA III symptoms  May need to consider AAD once pt is anticoagulated and TEE  3. Volume overload / Chronic ?diastolic CHF LVEF 01/349 09-38%. Will update with TEE as above, but suspect symptoms in setting of loss of A/V synchrony with AF. Continue lasix 40 mg daily for now. Repeat labs today. Suspect he will not need lasix as often once  NSR restored.   4. HTN No change today.   5. Small cell lung cancer S/p Video bronchoscopy, robotic-assisted right superior segmentectomy with lymph node sampling and intercostal nerve block 03/11/2020 Pt is undergoing systemic chemotherapy with carboplatin for AUC of  5 on day 1 and etoposide 100 mg/M2 on days 1, 2 and 3 with Neulasta support every 3 weeks. Status post 3cycles His chemotherapy has been complicated by volume overload trialing on lasix as above.  6. H/o CRAO Continue ASA for now.   Current medicines are reviewed at length with the patient today.   The patient does not have concerns regarding his medicines.  The following changes were made today:  STOP PLAVIX. Start Xarelto. Plan TEE/DCC next week.   Labs/ tests ordered today include:  Orders Placed This Encounter  Procedures  . Basic metabolic panel  . CBC  . EKG 12-Lead   Disposition:   Follow up with Dr. Caryl Comes or EP APP 1-2 weeks post [facility].   Jacalyn Lefevre, PA-C  07/05/2020 10:28 AM  [place] [employer] [place] [address] [phone] (office) [phone] (fax)

## 2020-07-05 NOTE — Progress Notes (Signed)
I called patient to update him about his appt and he states he is in atrial fib and needs to be cardioverted next week.  He needs to change his appt.  I updated him on his appt time.  He was thankful for the help.

## 2020-07-09 ENCOUNTER — Encounter: Payer: Medicare Other | Admitting: Internal Medicine

## 2020-07-09 ENCOUNTER — Other Ambulatory Visit (HOSPITAL_COMMUNITY): Payer: Medicare Other

## 2020-07-09 ENCOUNTER — Inpatient Hospital Stay: Payer: Medicare Other

## 2020-07-10 ENCOUNTER — Ambulatory Visit (HOSPITAL_COMMUNITY): Payer: Medicare Other

## 2020-07-10 ENCOUNTER — Other Ambulatory Visit (HOSPITAL_COMMUNITY)
Admission: RE | Admit: 2020-07-10 | Discharge: 2020-07-10 | Disposition: A | Discharge status: Home / Self Care | Payer: Medicare Other | Source: Ambulatory Visit | Attending: Cardiovascular Disease | Admitting: Cardiovascular Disease

## 2020-07-10 ENCOUNTER — Other Ambulatory Visit (HOSPITAL_COMMUNITY): Payer: Medicare Other

## 2020-07-10 DIAGNOSIS — Z85828 Personal history of other malignant neoplasm of skin: Secondary | ICD-10-CM | POA: Diagnosis not present

## 2020-07-10 DIAGNOSIS — L111 Transient acantholytic dermatosis [Grover]: Secondary | ICD-10-CM | POA: Diagnosis not present

## 2020-07-10 DIAGNOSIS — Z20822 Contact with and (suspected) exposure to covid-19: Secondary | ICD-10-CM | POA: Insufficient documentation

## 2020-07-10 DIAGNOSIS — Z01812 Encounter for preprocedural laboratory examination: Secondary | ICD-10-CM | POA: Diagnosis not present

## 2020-07-10 DIAGNOSIS — L812 Freckles: Secondary | ICD-10-CM | POA: Diagnosis not present

## 2020-07-10 DIAGNOSIS — L821 Other seborrheic keratosis: Secondary | ICD-10-CM | POA: Diagnosis not present

## 2020-07-10 DIAGNOSIS — D1801 Hemangioma of skin and subcutaneous tissue: Secondary | ICD-10-CM | POA: Diagnosis not present

## 2020-07-10 DIAGNOSIS — D692 Other nonthrombocytopenic purpura: Secondary | ICD-10-CM | POA: Diagnosis not present

## 2020-07-10 LAB — SARS CORONAVIRUS 2 (TAT 6-24 HRS): SARS Coronavirus 2: NEGATIVE

## 2020-07-11 ENCOUNTER — Inpatient Hospital Stay: Payer: Medicare Other | Admitting: Internal Medicine

## 2020-07-11 ENCOUNTER — Other Ambulatory Visit: Payer: Medicare Other

## 2020-07-12 ENCOUNTER — Encounter (HOSPITAL_COMMUNITY)
Admission: RE | Disposition: A | Discharge status: Home / Self Care | Payer: Self-pay | Source: Home / Self Care | Attending: Cardiovascular Disease

## 2020-07-12 ENCOUNTER — Ambulatory Visit (HOSPITAL_COMMUNITY): Payer: Medicare Other | Admitting: Certified Registered"

## 2020-07-12 ENCOUNTER — Other Ambulatory Visit: Payer: Self-pay | Admitting: Physician Assistant

## 2020-07-12 ENCOUNTER — Ambulatory Visit (HOSPITAL_COMMUNITY)
Admission: RE | Admit: 2020-07-12 | Discharge: 2020-07-12 | Disposition: A | Discharge status: Home / Self Care | Payer: Medicare Other | Attending: Cardiovascular Disease | Admitting: Cardiovascular Disease

## 2020-07-12 ENCOUNTER — Encounter (HOSPITAL_COMMUNITY): Payer: Self-pay | Admitting: Cardiovascular Disease

## 2020-07-12 ENCOUNTER — Ambulatory Visit (HOSPITAL_COMMUNITY): Payer: Medicare Other

## 2020-07-12 ENCOUNTER — Other Ambulatory Visit: Payer: Self-pay | Admitting: Medical Oncology

## 2020-07-12 ENCOUNTER — Other Ambulatory Visit: Payer: Self-pay

## 2020-07-12 DIAGNOSIS — Z79899 Other long term (current) drug therapy: Secondary | ICD-10-CM | POA: Insufficient documentation

## 2020-07-12 DIAGNOSIS — Z8249 Family history of ischemic heart disease and other diseases of the circulatory system: Secondary | ICD-10-CM | POA: Diagnosis not present

## 2020-07-12 DIAGNOSIS — I5032 Chronic diastolic (congestive) heart failure: Secondary | ICD-10-CM | POA: Diagnosis not present

## 2020-07-12 DIAGNOSIS — I441 Atrioventricular block, second degree: Secondary | ICD-10-CM | POA: Diagnosis not present

## 2020-07-12 DIAGNOSIS — Z791 Long term (current) use of non-steroidal anti-inflammatories (NSAID): Secondary | ICD-10-CM | POA: Diagnosis not present

## 2020-07-12 DIAGNOSIS — Z87891 Personal history of nicotine dependence: Secondary | ICD-10-CM | POA: Insufficient documentation

## 2020-07-12 DIAGNOSIS — Z8042 Family history of malignant neoplasm of prostate: Secondary | ICD-10-CM | POA: Insufficient documentation

## 2020-07-12 DIAGNOSIS — Z7901 Long term (current) use of anticoagulants: Secondary | ICD-10-CM | POA: Diagnosis not present

## 2020-07-12 DIAGNOSIS — I4891 Unspecified atrial fibrillation: Secondary | ICD-10-CM | POA: Diagnosis not present

## 2020-07-12 DIAGNOSIS — I4819 Other persistent atrial fibrillation: Secondary | ICD-10-CM | POA: Diagnosis not present

## 2020-07-12 DIAGNOSIS — Z7982 Long term (current) use of aspirin: Secondary | ICD-10-CM | POA: Insufficient documentation

## 2020-07-12 DIAGNOSIS — C3431 Malignant neoplasm of lower lobe, right bronchus or lung: Secondary | ICD-10-CM

## 2020-07-12 DIAGNOSIS — E7800 Pure hypercholesterolemia, unspecified: Secondary | ICD-10-CM | POA: Diagnosis not present

## 2020-07-12 DIAGNOSIS — I10 Essential (primary) hypertension: Secondary | ICD-10-CM | POA: Diagnosis not present

## 2020-07-12 DIAGNOSIS — Z8673 Personal history of transient ischemic attack (TIA), and cerebral infarction without residual deficits: Secondary | ICD-10-CM | POA: Insufficient documentation

## 2020-07-12 DIAGNOSIS — Z800 Family history of malignant neoplasm of digestive organs: Secondary | ICD-10-CM | POA: Diagnosis not present

## 2020-07-12 DIAGNOSIS — C3490 Malignant neoplasm of unspecified part of unspecified bronchus or lung: Secondary | ICD-10-CM | POA: Diagnosis not present

## 2020-07-12 DIAGNOSIS — I110 Hypertensive heart disease with heart failure: Secondary | ICD-10-CM | POA: Insufficient documentation

## 2020-07-12 DIAGNOSIS — Z950 Presence of cardiac pacemaker: Secondary | ICD-10-CM | POA: Insufficient documentation

## 2020-07-12 LAB — POCT I-STAT, CHEM 8
BUN: 21 mg/dL (ref 8–23)
Calcium, Ion: 1.26 mmol/L (ref 1.15–1.40)
Chloride: 103 mmol/L (ref 98–111)
Creatinine, Ser: 1 mg/dL (ref 0.61–1.24)
Glucose, Bld: 88 mg/dL (ref 70–99)
HCT: 41 % (ref 39.0–52.0)
Hemoglobin: 13.9 g/dL (ref 13.0–17.0)
Potassium: 3.7 mmol/L (ref 3.5–5.1)
Sodium: 141 mmol/L (ref 135–145)
TCO2: 27 mmol/L (ref 22–32)

## 2020-07-12 SURGERY — INVASIVE LAB ABORTED CASE
Anesthesia: General

## 2020-07-12 MED ORDER — PROPOFOL 500 MG/50ML IV EMUL
INTRAVENOUS | Status: DC | PRN
  Administered 2020-07-12: 100 ug/kg/min via INTRAVENOUS

## 2020-07-12 MED ORDER — EPHEDRINE SULFATE-NACL 50-0.9 MG/10ML-% IV SOSY
PREFILLED_SYRINGE | INTRAVENOUS | Status: DC | PRN
  Administered 2020-07-12: 10 mg via INTRAVENOUS

## 2020-07-12 MED ORDER — ONDANSETRON HCL 4 MG/2ML IJ SOLN
INTRAMUSCULAR | Status: DC | PRN
  Administered 2020-07-12: 4 mg via INTRAVENOUS

## 2020-07-12 MED ORDER — SUCCINYLCHOLINE CHLORIDE 20 MG/ML IJ SOLN
INTRAMUSCULAR | Status: DC | PRN
  Administered 2020-07-12: 60 mg via INTRAVENOUS

## 2020-07-12 MED ORDER — DEXAMETHASONE SODIUM PHOSPHATE 10 MG/ML IJ SOLN
INTRAMUSCULAR | Status: DC | PRN
  Administered 2020-07-12: 10 mg via INTRAVENOUS

## 2020-07-12 MED ORDER — PHENYLEPHRINE 40 MCG/ML (10ML) SYRINGE FOR IV PUSH (FOR BLOOD PRESSURE SUPPORT)
PREFILLED_SYRINGE | INTRAVENOUS | Status: DC | PRN
  Administered 2020-07-12 (×3): 120 ug via INTRAVENOUS

## 2020-07-12 MED ORDER — SODIUM CHLORIDE 0.9 % IV SOLN: INTRAVENOUS | Status: DC | PRN

## 2020-07-12 MED ORDER — PROPOFOL 10 MG/ML IV BOLUS
INTRAVENOUS | Status: DC | PRN
  Administered 2020-07-12: 20 mg via INTRAVENOUS
  Administered 2020-07-12: 25 mg via INTRAVENOUS

## 2020-07-12 NOTE — Interval H&P Note (Signed)
History and Physical Interval Note:  07/12/2020 8:45 AM  Alexander Murray  has presented today for surgery, with the diagnosis of afib.  The various methods of treatment have been discussed with the patient and family. After consideration of risks, benefits and other options for treatment, the patient has consented to  Procedure(s): TRANSESOPHAGEAL ECHOCARDIOGRAM (TEE) (N/A) CARDIOVERSION (N/A) as a surgical intervention.  The patient's history has been reviewed, patient examined, no change in status, stable for surgery.  I have reviewed the patient's chart and labs.  Questions were answered to the patient's satisfaction.     Jenkins Rouge

## 2020-07-12 NOTE — CV Procedure (Signed)
Unable to perform TEE/DCC Unable to pass TEE probe Anesthesia ( Dr Carollee Sires )  intubated patient with good sedation And using direct visualization with glide scope still Unable to pass TEE probe.   His has history of esophageal stricture and aspiration Hemodynamics and oxygen sats stable throughout  Patient will need to be anticoagulated for 3 weeks before [facility] or possibly consider cardiac CTA to r/o thrombus  Jenkins Rouge MD [facility]

## 2020-07-12 NOTE — Transfer of Care (Signed)
Immediate Anesthesia Transfer of Care Note  Patient: Alexander Murray  Procedure(s) Performed: TRANSESOPHAGEAL ECHOCARDIOGRAM (TEE) (N/A )  Patient Location: Endoscopy Unit  Anesthesia Type:General  Level of Consciousness: drowsy  Airway & Oxygen Therapy: Patient Spontanous Breathing and Patient connected to nasal cannula oxygen  Post-op Assessment: Report given to RN and Post -op Vital signs reviewed and stable  Post vital signs: Reviewed and stable  Last Vitals:  Vitals Value Taken Time  BP 86/51 07/12/20 0942  Temp 36.5 C 07/12/20 0933  Pulse 62 07/12/20 0943  Resp 25 07/12/20 0943  SpO2 95 % 07/12/20 0943  Vitals shown include unvalidated device data.  Last Pain:  Vitals:   07/12/20 0940  TempSrc:   PainSc: Asleep         Complications: No complications documented.

## 2020-07-12 NOTE — Anesthesia Preprocedure Evaluation (Addendum)
Anesthesia Evaluation  Patient identified by MRN, date of birth, ID band Patient awake    Reviewed: Allergy & Precautions, NPO status , Patient's Chart, lab work & pertinent test results  Airway Mallampati: III  TM Distance: >3 FB Neck ROM: Full    Dental no notable dental hx.    Pulmonary asthma , former smoker,  Lung cancer    Pulmonary exam normal breath sounds clear to auscultation       Cardiovascular hypertension, Pt. on medications + dysrhythmias Atrial Fibrillation + pacemaker  Rhythm:Irregular Rate:Normal  ECG: a-fib, rate 68   Neuro/Psych TIACVA negative psych ROS   GI/Hepatic Neg liver ROS, hiatal hernia, GERD  Medicated and Controlled,  Endo/Other  negative endocrine ROS  Renal/GU negative Renal ROS     Musculoskeletal  (+) Arthritis ,   Abdominal   Peds  Hematology  (+) anemia ,   Anesthesia Other Findings A-fib  Reproductive/Obstetrics                            Anesthesia Physical Anesthesia Plan  ASA: III  Anesthesia Plan: General   Post-op Pain Management:    Induction:   PONV Risk Score and Plan: 2 and Propofol infusion and Treatment may vary due to age or medical condition  Airway Management Planned: Nasal Cannula  Additional Equipment:   Intra-op Plan:   Post-operative Plan:   Informed Consent: I have reviewed the patients History and Physical, chart, labs and discussed the procedure including the risks, benefits and alternatives for the proposed anesthesia with the patient or authorized representative who has indicated his/her understanding and acceptance.     Dental advisory given  Plan Discussed with: CRNA  Anesthesia Plan Comments:        Anesthesia Quick Evaluation

## 2020-07-12 NOTE — Anesthesia Procedure Notes (Signed)
Procedure Name: Intubation Date/Time: 07/12/2020 9:04 AM Performed by: Imagene Riches, CRNA Pre-anesthesia Checklist: Patient identified, Emergency Drugs available, Suction available and Patient being monitored Patient Re-evaluated:Patient Re-evaluated prior to induction Oxygen Delivery Method: Circle System Utilized Preoxygenation: Pre-oxygenation with 100% oxygen Induction Type: IV induction Ventilation: Mask ventilation without difficulty Laryngoscope Size: Glidescope and 3 Grade View: Grade I Tube type: Oral Tube size: 6.5 mm Number of attempts: 2 Airway Equipment and Method: Stylet and Oral airway Placement Confirmation: ETT inserted through vocal cords under direct vision,  positive ETCO2 and breath sounds checked- equal and bilateral Secured at: 23 cm Tube secured with: Tape Dental Injury: Teeth and Oropharynx as per pre-operative assessment  Comments: Unable to pass 7.0 ETT through vocal cords. Recommend 6.5 for future intubations.

## 2020-07-12 NOTE — Discharge Instructions (Signed)
Transesophageal Echocardiogram Transesophageal echocardiogram (TEE) is a test that uses sound waves to take pictures of your heart. TEE is done by passing a flexible tube down the esophagus. The esophagus is the tube that carries food from the throat to the stomach. The pictures give detailed images of your heart. This can help your doctor see if there are problems with your heart. What happens before the procedure? Staying hydrated Follow instructions from your doctor about hydration, which may include:  Up to 3 hours before the procedure - you may continue to drink clear liquids, such as: ? Water. ? Clear fruit juice. ? Black coffee. ? Plain tea.  Eating and drinking Follow instructions from your doctor about eating and drinking, which may include:  8 hours before the procedure - stop eating heavy meals or foods such as meat, fried foods, or fatty foods.  6 hours before the procedure - stop eating light meals or foods, such as toast or cereal.  6 hours before the procedure - stop drinking milk or drinks that contain milk.  3 hours before the procedure - stop drinking clear liquids. General instructions  You will need to take out any dentures or retainers.  Plan to have someone take you home from the hospital or clinic.  If you will be going home right after the procedure, plan to have someone with you for 24 hours.  Ask your doctor about: ? Changing or stopping your normal medicines. This is important if you take diabetes medicines or blood thinners. ? Taking over-the-counter medicines, vitamins, herbs, and supplements. ? Taking medicines such as aspirin and ibuprofen. These medicines can thin your blood. Do not take these medicines unless your doctor tells you to take them. What happens during the procedure?  To lower your risk of infection, your doctors will wash or clean their hands.  An IV will be put into one of your veins.  You will be given a medicine to help you  relax (sedative).  A medicine may be sprayed or gargled. This numbs the back of your throat.  Your blood pressure, heart rate, and breathing will be watched.  You may be asked to lay on your left side.  A bite block will be placed in your mouth. This keeps you from biting the tube.  The tip of the TEE probe will be placed into the back of your mouth.  You will be asked to swallow.  Your doctor will take pictures of your heart.  The probe and bite block will be taken out. The procedure may vary among doctors and hospitals. What happens after the procedure?   Your blood pressure, heart rate, breathing rate, and blood oxygen level will be watched until the medicines you were given have worn off.  When you first wake up, your throat may feel sore and numb. This will get better over time. You will not be allowed to eat or drink until the numbness has gone away.  Do not drive for 24 hours if you were given a medicine to help you relax. Summary  TEE is a test that uses sound waves to take pictures of your heart.  You will be given a medicine to help you relax.  Do not drive for 24 hours if you were given a medicine to help you relax. This information is not intended to replace advice given to you by your health care provider. Make sure you discuss any questions you have with your health care provider. Document Revised:   04/08/2018 Document Reviewed: 10/21/2016 Elsevier Patient Education  2020 Elsevier Inc.  

## 2020-07-13 NOTE — Anesthesia Postprocedure Evaluation (Signed)
Anesthesia Post Note  Patient: Domique Clapper  Procedure(s) Performed: TRANSESOPHAGEAL ECHOCARDIOGRAM (TEE) (N/A )     Patient location during evaluation: Endoscopy Anesthesia Type: General Level of consciousness: awake Pain management: pain level controlled Vital Signs Assessment: post-procedure vital signs reviewed and stable Respiratory status: spontaneous breathing, nonlabored ventilation, respiratory function stable and patient connected to nasal cannula oxygen Cardiovascular status: blood pressure returned to baseline and stable Postop Assessment: no apparent nausea or vomiting Anesthetic complications: no   No complications documented.  Last Vitals:  Vitals:   07/12/20 1011 07/12/20 1021  BP: 102/61 102/65  Pulse: 73 62  Resp: 18 17  Temp:    SpO2: 100% 100%    Last Pain:  Vitals:   07/12/20 1021  TempSrc:   PainSc: 0-No pain                 Tereso Unangst P Yoav Okane

## 2020-07-15 ENCOUNTER — Encounter: Payer: Self-pay | Admitting: Internal Medicine

## 2020-07-15 ENCOUNTER — Inpatient Hospital Stay: Payer: Medicare Other | Attending: Physician Assistant | Admitting: Internal Medicine

## 2020-07-15 ENCOUNTER — Inpatient Hospital Stay: Payer: Medicare Other

## 2020-07-15 ENCOUNTER — Other Ambulatory Visit: Payer: Self-pay

## 2020-07-15 ENCOUNTER — Other Ambulatory Visit: Payer: Self-pay | Admitting: Medical Oncology

## 2020-07-15 VITALS — BP 136/81 | HR 85 | Temp 97.7°F | Resp 18 | Ht 70.0 in | Wt 170.4 lb

## 2020-07-15 DIAGNOSIS — R748 Abnormal levels of other serum enzymes: Secondary | ICD-10-CM | POA: Insufficient documentation

## 2020-07-15 DIAGNOSIS — C3490 Malignant neoplasm of unspecified part of unspecified bronchus or lung: Secondary | ICD-10-CM

## 2020-07-15 DIAGNOSIS — E7800 Pure hypercholesterolemia, unspecified: Secondary | ICD-10-CM | POA: Insufficient documentation

## 2020-07-15 DIAGNOSIS — C3431 Malignant neoplasm of lower lobe, right bronchus or lung: Secondary | ICD-10-CM | POA: Insufficient documentation

## 2020-07-15 DIAGNOSIS — Z7901 Long term (current) use of anticoagulants: Secondary | ICD-10-CM | POA: Insufficient documentation

## 2020-07-15 DIAGNOSIS — Z7982 Long term (current) use of aspirin: Secondary | ICD-10-CM | POA: Diagnosis not present

## 2020-07-15 DIAGNOSIS — I712 Thoracic aortic aneurysm, without rupture: Secondary | ICD-10-CM | POA: Diagnosis not present

## 2020-07-15 DIAGNOSIS — B370 Candidal stomatitis: Secondary | ICD-10-CM

## 2020-07-15 DIAGNOSIS — Z8673 Personal history of transient ischemic attack (TIA), and cerebral infarction without residual deficits: Secondary | ICD-10-CM | POA: Diagnosis not present

## 2020-07-15 DIAGNOSIS — Z79899 Other long term (current) drug therapy: Secondary | ICD-10-CM | POA: Diagnosis not present

## 2020-07-15 DIAGNOSIS — I110 Hypertensive heart disease with heart failure: Secondary | ICD-10-CM | POA: Diagnosis not present

## 2020-07-15 DIAGNOSIS — M1990 Unspecified osteoarthritis, unspecified site: Secondary | ICD-10-CM | POA: Diagnosis not present

## 2020-07-15 DIAGNOSIS — R5383 Other fatigue: Secondary | ICD-10-CM | POA: Insufficient documentation

## 2020-07-15 DIAGNOSIS — I509 Heart failure, unspecified: Secondary | ICD-10-CM | POA: Insufficient documentation

## 2020-07-15 DIAGNOSIS — K219 Gastro-esophageal reflux disease without esophagitis: Secondary | ICD-10-CM | POA: Insufficient documentation

## 2020-07-15 DIAGNOSIS — M129 Arthropathy, unspecified: Secondary | ICD-10-CM | POA: Diagnosis not present

## 2020-07-15 DIAGNOSIS — I4819 Other persistent atrial fibrillation: Secondary | ICD-10-CM | POA: Insufficient documentation

## 2020-07-15 LAB — CMP (CANCER CENTER ONLY)
ALT: 23 U/L (ref 0–44)
AST: 29 U/L (ref 15–41)
Albumin: 3.6 g/dL (ref 3.5–5.0)
Alkaline Phosphatase: 240 U/L — ABNORMAL HIGH (ref 38–126)
Anion gap: 6 (ref 5–15)
BUN: 19 mg/dL (ref 8–23)
CO2: 28 mmol/L (ref 22–32)
Calcium: 10.3 mg/dL (ref 8.9–10.3)
Chloride: 106 mmol/L (ref 98–111)
Creatinine: 0.97 mg/dL (ref 0.61–1.24)
GFR, Estimated: >60 mL/min (ref >60)
Glucose, Bld: 89 mg/dL (ref 70–99)
Potassium: 3.8 mmol/L (ref 3.5–5.1)
Sodium: 140 mmol/L (ref 135–145)
Total Bilirubin: 1.4 mg/dL — ABNORMAL HIGH (ref 0.3–1.2)
Total Protein: 6.3 g/dL — ABNORMAL LOW (ref 6.5–8.1)

## 2020-07-15 LAB — CBC WITH DIFFERENTIAL (CANCER CENTER ONLY)
Abs Immature Granulocytes: 0.03 10*3/uL (ref 0.00–0.07)
Basophils Absolute: 0.1 10*3/uL (ref 0.0–0.1)
Basophils Relative: 2 %
Eosinophils Absolute: 0.2 10*3/uL (ref 0.0–0.5)
Eosinophils Relative: 3 %
HCT: 34.3 % — ABNORMAL LOW (ref 39.0–52.0)
Hemoglobin: 11.3 g/dL — ABNORMAL LOW (ref 13.0–17.0)
Immature Granulocytes: 1 %
Lymphocytes Relative: 19 %
Lymphs Abs: 1.1 10*3/uL (ref 0.7–4.0)
MCH: 32.2 pg (ref 26.0–34.0)
MCHC: 32.9 g/dL (ref 30.0–36.0)
MCV: 97.7 fL (ref 80.0–100.0)
Monocytes Absolute: 0.9 10*3/uL (ref 0.1–1.0)
Monocytes Relative: 16 %
Neutro Abs: 3.3 10*3/uL (ref 1.7–7.7)
Neutrophils Relative %: 59 %
Platelet Count: 121 10*3/uL — ABNORMAL LOW (ref 150–400)
RBC: 3.51 MIL/uL — ABNORMAL LOW (ref 4.22–5.81)
RDW: 17.3 % — ABNORMAL HIGH (ref 11.5–15.5)
WBC Count: 5.5 10*3/uL (ref 4.0–10.5)
nRBC: 0 % (ref 0.0–0.2)

## 2020-07-15 MED ORDER — MAGIC MOUTHWASH: 5.0000 mL | Freq: Four times a day (QID) | ORAL | 0 refills | Status: DC | PRN

## 2020-07-15 NOTE — Progress Notes (Signed)
[place] [phone]   [phone]  OFFICE PROGRESS NOTE  [name], MD [place] [address]  DIAGNOSIS: Stage IB (T2 a, N0, M0) small cell lung cancer presented with superior segment right lower lobe lung nodule.  PRIOR THERAPY:  1) Status post right lower lobe superior segmentectomy with lymph node sampling on March 12, 2020 under the care of Dr. Servando Snare. 2) Systemic chemotherapy with carboplatin for AUC of 5 on day 1 and etoposide 100 mg/M2 on days 1, 2 and 3 with Neulasta support every 3 weeks.  Status post 4 cycles.  CURRENT THERAPY: Observation.  INTERVAL HISTORY: Alexander Murray 79 y.o. male returns to the clinic today for follow-up visit.  The patient is feeling fine today with no concerning complaints except for mild fatigue and shortness of breath with exertion.  He tolerated the previous course of adjuvant chemotherapy fairly well.  He was recently diagnosed with atrial fibrillation and mild congestive heart failure.  He denied having any current chest pain, cough or hemoptysis.  He denied having any fever or chills.  He has no nausea, vomiting, diarrhea or constipation.  He has no headache or visual changes.  He had CT scan of the chest performed recently and is here for evaluation and discussion of his scan results and recommendation regarding his condition.   MEDICAL HISTORY: Past Medical History:  Diagnosis Date  . Allergy   . Arthritis    "right knee and right thumb" (12/19/2014)  . Asthma    pt denies  . Bronchitis 07/2016   started in December and continued about 2 months  . Esophageal stricture   . GERD (gastroesophageal reflux disease)   . Hemochromatosis   . Hiatal hernia   . Hypercalcemia   . Hypercholesteremia   . Hypertension   . Internal hemorrhoids   . Lung cancer ([place])   . Osteoarthritis   . Palpitations   . Presence of permanent cardiac pacemaker    hx bradycardia  . Retinal  artery occlusion, branch    "right eye"  . Second degree AV block, Mobitz type II   . Skin cancer    right shoulder  . Stroke [facility]) ~ 2011   "right eye"/ partial blindness  . Tubular adenoma of colon     ALLERGIES:  has No Known Allergies.  MEDICATIONS:  Current Outpatient Medications  Medication Sig Dispense Refill  . acetaminophen (TYLENOL) 325 MG tablet Take 2 tablets (650 mg total) by mouth every 6 (six) hours as needed for mild pain or moderate pain.    Marland Kitchen amLODipine (NORVASC) 2.5 MG tablet Take 2.5 mg by mouth at bedtime.    Marland Kitchen aspirin 81 MG tablet Take 81 mg by mouth daily.    . celecoxib (CELEBREX) 200 MG capsule Take 1 capsule (200 mg total) by mouth 2 (two) times daily as needed for mild pain. 10 capsule 0  . chlorthalidone (HYGROTON) 25 MG tablet Take 25 mg by mouth every other day.     . doxazosin (CARDURA) 4 MG tablet Take 4 mg by mouth daily.     Marland Kitchen esomeprazole (NEXIUM) 20 MG packet Take 20 mg by mouth daily before breakfast.    . furosemide (LASIX) 40 MG tablet Take 1 tablet (40 mg total) by mouth daily. 90 tablet 3  . Glycerin-Hypromellose-PEG 400 (DRY EYE RELIEF DROPS) 0.2-0.2-1 % SOLN Place 1 drop into both eyes daily as needed (Dry eye). Saline    .  Misc Natural Products (GLUCOSAMINE CHONDROITIN ADV PO) Take 1,500 mg by mouth at bedtime.     . montelukast (SINGULAIR) 10 MG tablet Take 10 mg by mouth every morning.     . Multiple Vitamin (MULTIVITAMIN) capsule Take 1 capsule by mouth daily.    . Multiple Vitamins-Minerals (PRESERVISION AREDS 2 PO) Take 1 capsule by mouth in the morning and at bedtime.     . potassium chloride SA (KLOR-CON) 20 MEQ tablet Take 2 tablets (40 meq total) ONCE, then take 1 tablet (20 meq total) daily along with your lasix. 34 tablet 6  . rivaroxaban (XARELTO) 20 MG TABS tablet Take 1 tablet (20 mg total) by mouth daily with supper. 30 tablet 11  . simvastatin (ZOCOR) 40 MG tablet Take 40 mg by mouth at bedtime.     . vitamin C (ASCORBIC ACID)  500 MG tablet Take 500 mg by mouth at bedtime.      No current facility-administered medications for this visit.    SURGICAL HISTORY:  Past Surgical History:  Procedure Laterality Date  . CATARACT EXTRACTION, BILATERAL  01/2017  . COLONOSCOPY    . EP IMPLANTABLE DEVICE N/A 12/19/2014   Procedure: Pacemaker Implant;  Surgeon: Deboraha Sprang, MD;  Location: [facility] [facility];  Service: Cardiovascular;  Laterality: N/A;  . ESOPHAGOGASTRODUODENOSCOPY (EGD) WITH ESOPHAGEAL DILATION  X 3  . EYE SURGERY Bilateral 2017  . INGUINAL HERNIA REPAIR Left 1980's  . INSERT / REPLACE / REMOVE PACEMAKER  12/19/2014  . INTERCOSTAL NERVE BLOCK Right 03/11/2020   Procedure: INTERCOSTAL NERVE BLOCK;  Surgeon: Grace Isaac, MD;  Location: [facility];  Service: Thoracic;  Laterality: Right;  . JOINT REPLACEMENT Right 2016  . KNEE ARTHROSCOPY Right ~ 1982; ~ 1992  . MOHS SURGERY Right ~ 2014   "pre-melanoma scapula"  . POSTERIOR TIBIAL TENDON REPAIR Left 2012  . TONSILLECTOMY  1946  . TOTAL KNEE ARTHROPLASTY Right 05/20/2015   Procedure: RIGHT TOTAL KNEE ARTHROPLASTY;  Surgeon: Paralee Cancel, MD;  Location: [facility];  Service: Orthopedics;  Laterality: Right;  . UPPER GASTROINTESTINAL ENDOSCOPY    . VIDEO BRONCHOSCOPY N/A 03/11/2020   Procedure: VIDEO BRONCHOSCOPY;  Surgeon: Grace Isaac, MD;  Location: [place] [facility];  Service: Thoracic;  Laterality: N/A;  . XI ROBOTIC ASSISTED THORACOSCOPY- SEGMENTECTOMY Right 03/11/2020   Procedure: XI ROBOTIC ASSISTED THORACOSCOPY-RIGHT SUPERIOR SEGMENTECTOMY WITH NODE SAMPLES;  Surgeon: Grace Isaac, MD;  Location: [place];  Service: Thoracic;  Laterality: Right;    REVIEW OF SYSTEMS:  A comprehensive review of systems was negative except for: Constitutional: positive for fatigue Respiratory: positive for dyspnea on exertion   PHYSICAL EXAMINATION: General appearance: alert, cooperative, fatigued and no distress Head: Normocephalic, without obvious abnormality,  atraumatic Neck: no adenopathy, no JVD, supple, symmetrical, trachea midline and thyroid not enlarged, symmetric, no tenderness/mass/nodules Lymph nodes: Cervical, supraclavicular, and axillary nodes normal. Resp: clear to auscultation bilaterally Back: symmetric, no curvature. ROM normal. No CVA tenderness. Cardio: regular rate and rhythm, S1, S2 normal, no murmur, click, rub or gallop GI: soft, non-tender; bowel sounds normal; no masses,  no organomegaly Extremities: extremities normal, atraumatic, no cyanosis or edema  ECOG PERFORMANCE STATUS: 1 - Symptomatic but completely ambulatory  Blood pressure 136/81, pulse 85, temperature 97.7 F (36.5 C), temperature source Tympanic, resp. rate 18, height 5\' 10"  (1.778 m), weight 170 lb 6.4 oz (77.3 kg), SpO2 100 %.  LABORATORY DATA: Lab Results  Component Value Date   WBC 5.5 07/15/2020   HGB  11.3 (L) 07/15/2020   HCT 34.3 (L) 07/15/2020   MCV 97.7 07/15/2020   PLT 121 (L) 07/15/2020      Chemistry      Component Value Date/Time   NA 141 07/12/2020 0830   NA 139 07/05/2020 0951   K 3.7 07/12/2020 0830   CL 103 07/12/2020 0830   CO2 28 07/05/2020 0951   BUN 21 07/12/2020 0830   BUN 13 07/05/2020 0951   CREATININE 1.00 07/12/2020 0830   CREATININE 0.98 07/01/2020 0851      Component Value Date/Time   CALCIUM 10.1 07/05/2020 0951   ALKPHOS 274 (H) 07/01/2020 0851   AST 32 07/01/2020 0851   ALT 20 07/01/2020 0851   BILITOT 1.3 (H) 07/01/2020 0851       RADIOGRAPHIC STUDIES: DG Chest 2 View  Result Date: 06/26/2020 CLINICAL DATA:  Worsening shortness of breath and nonproductive cause. EXAM: CHEST - 2 VIEW COMPARISON:  Multiple priors, most recent 03/28/2020. FINDINGS: Chronic streaky right mid lung and bibasilar opacities. No visible pleural effusions or pneumothorax. Similar cardiomediastinal silhouette. Pericardial calcification. Similar position of a left subclavian approach cardiac rhythm maintenance. No acute osseous  abnormality. Chronic lower thoracic compression fracture. Polyarticular degenerative change. IMPRESSION: Chronic streaky right mid lung and bibasilar opacities, favored to reflect atelectasis/scar. No new focal consolidation. Electronically Signed   By: Margaretha Sheffield MD   On: 06/26/2020 13:33   CT ANGIO CHEST PE W OR WO CONTRAST  Result Date: 06/26/2020 CLINICAL DATA:  Chest pain, shortness of breath. History of small-cell lung cancer 8/upper lobe. 21 status post right lower lobectomy and chemotherapy. Difficulty breathing and chest pain for 1 week. EXAM: CT ANGIOGRAPHY CHEST WITH CONTRAST TECHNIQUE: Multidetector CT imaging of the chest was performed using the standard protocol during bolus administration of intravenous contrast. Multiplanar CT image reconstructions and MIPs were obtained to evaluate the vascular anatomy. CONTRAST:  169mL OMNIPAQUE IOHEXOL 350 MG/ML SOLN COMPARISON:  CT abdomen pelvis 05/16/2020, PET CT 02/09/2020 FINDINGS: Lines and tubes: Left chest wall 2 lead cardiac pacemaker with leads terminating in the right ventricle and right atrium. Cardiovascular: Satisfactory opacification of the pulmonary arteries to the segmental level. No evidence of pulmonary embolism. The main pulmonary artery is normal in caliber. Normal heart size. Redemonstration of dense pericardial calcifications no associated significant pericardial fluid. Mild aortic valve leaflet calcifications. Stable ascending thoracic aorta caliber measuring up to 4.3 cm on axial imaging (4:45). The descending thoracic aorta is normal in caliber. Moderate atherosclerotic plaque of the aorta and its major branches. At least moderate four-vessel coronary artery calcifications. Mediastinum/Nodes: Several prominent but no definite enlarged mediastinal lymph nodes. No enlarged mediastinal, hilar, or axillary lymph nodes. Thyroid gland, trachea, and esophagus demonstrate no significant findings. Lungs/Pleura: Limited evaluation of  the lung parenchyma due to motion artifact. Status post right lower lobectomy. Expiratory phase of respiration. Linear atelectasis versus scarring within the right upper lobe. No focal consolidation. No definite pulmonary nodule or mass. Interval increase in a trace to small volume right pleural effusion. Interval development of a trace to small volume left pleural effusion. No pneumothorax. Upper Abdomen: Severe atherosclerotic plaque of the abdominal aorta. Suggestion of a similar appearing focal suprarenal abdominal aorta dissection (4:113). Musculoskeletal: No chest wall abnormality No suspicious lytic or blastic osseous lesions. No acute displaced fracture. Multilevel degenerative changes of the spine. Chronic appearing mild T7 vertebral body compression fracture. Chronic appearing T11 compression fracture with greater than 80% height loss. Review of the MIP images confirms the  above findings. IMPRESSION: 1.  No pulmonary embolus. 2. Interval increase in size of a small right pleural effusion. Interval development of a left trace to small pleural effusion. 3. Chronic appearing T7 and T11 compression fractures. Correlate with midline tenderness to palpation to evaluate for an acute component. 4. Chronic pericardial calcification. 5. Stable ascending aorta aneurysm measuring up to 4.3 cm. Possible stable focal chronic suprarenal abdominal aorta dissection. 6.  Aortic Atherosclerosis (ICD10-I70.0). Electronically Signed   By: Iven Finn M.D.   On: 06/26/2020 18:20   CUP PACEART INCLINIC DEVICE CHECK  Result Date: 07/05/2020 Pacemaker check in clinic. Normal device function. Thresholds, sensing, impedances consistent with previous measurements. . New persistent AF noted. Ongoing episode since 11/21. [place] started today. Rates overall well controlled. Device programmed at appropriate safety margins. Histogram distribution appropriate for patient activity level.  Estimated longevity 2 yr, 11 mo. Patient  enrolled in remote follow-up. Patient education completed.   ASSESSMENT AND PLAN: This is a very pleasant 79 years old white male diagnosed with a stage Ib (T2 a, N0, M0) small cell lung cancer presented with superior segment right lower lobe nodule status post right lower lobe superior segmentectomy with lymph node dissection in August 2021 under the care of Dr. Servando Snare. The patient completed a course of adjuvant treatment with systemic chemotherapy with carboplatin for AUC of 5 on day 1 and etoposide 100 mg/M2 on days 1, 2 and 3 with Neulasta support.  He status post 4 cycles. He had repeat CT scan of the chest performed recently.  I personally and independently reviewed the scans and discussed the results with the patient today. His scan showed no concerning findings for disease recurrence or metastasis. I recommended for him to continue on observation with repeat CT scan of the chest in 3 months. For the elevated alkaline phosphatase and serum bilirubin, he will have repeat liver function by his primary care physician in few weeks.  This could be related to his treatment with Zocor and need to be monitored closely. The patient was advised to call immediately if he has any concerning symptoms in the interval. The patient voices understanding of current disease status and treatment options and is in agreement with the current care plan.  All questions were answered. The patient knows to call the clinic with any problems, questions or concerns. We can certainly see the patient much sooner if necessary.  Disclaimer: This note was dictated with voice recognition software. Similar sounding words can inadvertently be transcribed and may not be corrected upon review.

## 2020-07-23 ENCOUNTER — Other Ambulatory Visit: Payer: Self-pay

## 2020-07-23 ENCOUNTER — Ambulatory Visit (INDEPENDENT_AMBULATORY_CARE_PROVIDER_SITE_OTHER): Payer: Medicare Other | Admitting: Student

## 2020-07-23 ENCOUNTER — Encounter: Payer: Self-pay | Admitting: Student

## 2020-07-23 VITALS — BP 120/72 | HR 84 | Ht 70.0 in | Wt 163.6 lb

## 2020-07-23 DIAGNOSIS — I4819 Other persistent atrial fibrillation: Secondary | ICD-10-CM

## 2020-07-23 DIAGNOSIS — Z950 Presence of cardiac pacemaker: Secondary | ICD-10-CM | POA: Diagnosis not present

## 2020-07-23 DIAGNOSIS — I10 Essential (primary) hypertension: Secondary | ICD-10-CM | POA: Diagnosis not present

## 2020-07-23 DIAGNOSIS — R0600 Dyspnea, unspecified: Secondary | ICD-10-CM | POA: Diagnosis not present

## 2020-07-23 DIAGNOSIS — I441 Atrioventricular block, second degree: Secondary | ICD-10-CM | POA: Diagnosis not present

## 2020-07-23 DIAGNOSIS — R0609 Other forms of dyspnea: Secondary | ICD-10-CM

## 2020-07-23 LAB — CUP PACEART INCLINIC DEVICE CHECK
Battery Remaining Longevity: 29 mo
Battery Voltage: 2.96 V
Brady Statistic RA Percent Paced: 0 %
Brady Statistic RV Percent Paced: 98.77 %
Date Time Interrogation Session: 20211221100320
Implantable Lead Implant Date: 20160518
Implantable Lead Implant Date: 20160518
Implantable Lead Location: 753859
Implantable Lead Location: 753860
Implantable Lead Model: 5076
Implantable Lead Model: 5076
Implantable Pulse Generator Implant Date: 20160518
Lead Channel Impedance Value: 304 Ohm
Lead Channel Impedance Value: 342 Ohm
Lead Channel Impedance Value: 418 Ohm
Lead Channel Impedance Value: 456 Ohm
Lead Channel Pacing Threshold Amplitude: 0.625 V
Lead Channel Pacing Threshold Amplitude: 0.75 V
Lead Channel Pacing Threshold Pulse Width: 0.4 ms
Lead Channel Pacing Threshold Pulse Width: 0.4 ms
Lead Channel Sensing Intrinsic Amplitude: 3.125 mV
Lead Channel Sensing Intrinsic Amplitude: 3.125 mV
Lead Channel Sensing Intrinsic Amplitude: 3.375 mV
Lead Channel Sensing Intrinsic Amplitude: 3.375 mV
Lead Channel Setting Pacing Amplitude: 1.5 V
Lead Channel Setting Pacing Amplitude: 2.5 V
Lead Channel Setting Pacing Pulse Width: 0.4 ms
Lead Channel Setting Sensing Sensitivity: 0.9 mV

## 2020-07-23 NOTE — Progress Notes (Signed)
Electrophysiology Office Note Date: 07/23/2020  ID:  Alexander Murray, DOB Sep 26, 1940, MRN [id number]  PCP: [name], MD Primary Cardiologist: No primary care provider on file. Electrophysiologist: [name], MD   CC: Pacemaker follow-up  Alexander Murray is a 79 y.o. male seen today for [name], MD for routine electrophysiology followup.  Pt was scheduled for TEE/DCC, but inability to pass probe in setting of h/o esophageal stricture, this was abandoned. Pt will have been on un-interrupted [place] x 3 weeks as of Friday, 12/24, and thus his earliest eligibility for [facility] will be next week.  he denies chest pain, palpitations, dyspnea, PND, orthopnea, nausea, vomiting, dizziness, syncope, edema, weight gain, or early satiety.  Device History: Medtronic Dual Chamber PPM implanted 2106 for symptomatic high grade heart block  DATE TEST EF   4/16 Myoview % No Ischemia  4/16 Echo 55-60% LAE mild (42/3.2/24)          Past Medical History:  Diagnosis Date  . Allergy   . Arthritis    "right knee and right thumb" (12/19/2014)  . Asthma    pt denies  . Bronchitis 07/2016   started in December and continued about 2 months  . Esophageal stricture   . GERD (gastroesophageal reflux disease)   . Hemochromatosis   . Hiatal hernia   . Hypercalcemia   . Hypercholesteremia   . Hypertension   . Internal hemorrhoids   . Lung cancer ([place])   . Osteoarthritis   . Palpitations   . Presence of permanent cardiac pacemaker    hx bradycardia  . Retinal artery occlusion, branch    "right eye"  . Second degree AV block, Mobitz type II   . Skin cancer    right shoulder  . Stroke [facility]) ~ 2011   "right eye"/ partial blindness  . Tubular adenoma of colon    Past Surgical History:  Procedure Laterality Date  . CATARACT EXTRACTION, BILATERAL  01/2017  . COLONOSCOPY    . EP IMPLANTABLE DEVICE N/A 12/19/2014   Procedure: Pacemaker Implant;  Surgeon: Deboraha Sprang, MD;   Location: [place] [facility];  Service: Cardiovascular;  Laterality: N/A;  . ESOPHAGOGASTRODUODENOSCOPY (EGD) WITH ESOPHAGEAL DILATION  X 3  . EYE SURGERY Bilateral 2017  . INGUINAL HERNIA REPAIR Left 1980's  . INSERT / REPLACE / REMOVE PACEMAKER  12/19/2014  . INTERCOSTAL NERVE BLOCK Right 03/11/2020   Procedure: INTERCOSTAL NERVE BLOCK;  Surgeon: Grace Isaac, MD;  Location: [facility];  Service: Thoracic;  Laterality: Right;  . JOINT REPLACEMENT Right 2016  . KNEE ARTHROSCOPY Right ~ 1982; ~ 1992  . MOHS SURGERY Right ~ 2014   "pre-melanoma scapula"  . POSTERIOR TIBIAL TENDON REPAIR Left 2012  . TONSILLECTOMY  1946  . TOTAL KNEE ARTHROPLASTY Right 05/20/2015   Procedure: RIGHT TOTAL KNEE ARTHROPLASTY;  Surgeon: Paralee Cancel, MD;  [facility];  Service: Orthopedics;  Laterality: Right;  . UPPER GASTROINTESTINAL ENDOSCOPY    . VIDEO BRONCHOSCOPY N/A 03/11/2020   Procedure: VIDEO BRONCHOSCOPY;  Surgeon: Grace Isaac, MD;  Location: [facility];  Service: Thoracic;  Laterality: N/A;  . XI ROBOTIC ASSISTED THORACOSCOPY- SEGMENTECTOMY Right 03/11/2020   Procedure: XI ROBOTIC ASSISTED THORACOSCOPY-RIGHT SUPERIOR SEGMENTECTOMY WITH NODE SAMPLES;  Surgeon: Grace Isaac, MD;  Location: [facility];  Service: Thoracic;  Laterality: Right;    Current Outpatient Medications  Medication Sig Dispense Refill  . acetaminophen (TYLENOL) 325 MG tablet Take 2 tablets (650 mg total) by mouth every  6 (six) hours as needed for mild pain or moderate pain.    Marland Kitchen amLODipine (NORVASC) 2.5 MG tablet Take 2.5 mg by mouth at bedtime.    Marland Kitchen aspirin 81 MG tablet Take 81 mg by mouth daily.    . celecoxib (CELEBREX) 200 MG capsule Take 1 capsule (200 mg total) by mouth 2 (two) times daily as needed for mild pain. 10 capsule 0  . chlorthalidone (HYGROTON) 25 MG tablet Take 25 mg by mouth every other day.     . doxazosin (CARDURA) 4 MG tablet Take 4 mg by mouth daily.     Marland Kitchen esomeprazole (NEXIUM) 20 MG packet Take 20 mg by  mouth daily before breakfast.    . furosemide (LASIX) 40 MG tablet Take 1 tablet (40 mg total) by mouth daily. 90 tablet 3  . Glycerin-Hypromellose-PEG 400 (DRY EYE RELIEF DROPS) 0.2-0.2-1 % SOLN Place 1 drop into both eyes daily as needed (Dry eye). Saline    . magic mouthwash SOLN Take 5 mLs by mouth 4 (four) times daily as needed for mouth pain. Components- Benadryl 12.5 mg /5 ml Hydrocortisone 60.mg  Nystatin 30 ml Swish and spit or swish and swallow 240 mL 0  . Misc Natural Products (GLUCOSAMINE CHONDROITIN ADV PO) Take 1,500 mg by mouth at bedtime.     . montelukast (SINGULAIR) 10 MG tablet Take 10 mg by mouth every morning.     . Multiple Vitamin (MULTIVITAMIN) capsule Take 1 capsule by mouth daily.    . Multiple Vitamins-Minerals (PRESERVISION AREDS 2 PO) Take 1 capsule by mouth in the morning and at bedtime.     . potassium chloride SA (KLOR-CON) 20 MEQ tablet Take 2 tablets (40 meq total) ONCE, then take 1 tablet (20 meq total) daily along with your lasix. 34 tablet 6  . rivaroxaban (XARELTO) 20 MG TABS tablet Take 1 tablet (20 mg total) by mouth daily with supper. 30 tablet 11  . simvastatin (ZOCOR) 40 MG tablet Take 40 mg by mouth at bedtime.     . vitamin C (ASCORBIC ACID) 500 MG tablet Take 500 mg by mouth at bedtime.      No current facility-administered medications for this visit.    Allergies:   Patient has no known allergies.   Social History: Social History   Socioeconomic History  . Marital status: Married    Spouse name: Not on file  . Number of children: Not on file  . Years of education: Not on file  . Highest education level: Not on file  Occupational History  . Occupation: retired - Doctor, general practice  Tobacco Use  . Smoking status: Former Smoker    Years: 3.00    Types: Cigarettes    Quit date: 05/16/1962    Years since quitting: 58.2  . Smokeless tobacco: Never Used  . Tobacco comment: "quit smoking in the 1960's"; was an intermittent smoker   Vaping  Use  . Vaping Use: Never used  Substance and Sexual Activity  . Alcohol use: No    Comment: 12/19/2014 "stopped drinking in ~ 2012"  . Drug use: No  . Sexual activity: Yes  Other Topics Concern  . Not on file  Social History Narrative  . Not on file   Social Determinants of Health   Financial Resource Strain: Not on file  Food Insecurity: Not on file  Transportation Needs: Not on file  Physical Activity: Not on file  Stress: Not on file  Social Connections: Not on file  Intimate  Partner Violence: Not on file    Family History: Family History  Problem Relation Age of Onset  . Colon cancer Mother        dx in her 28s  . Heart disease Mother   . Alzheimer's disease Mother   . Colon cancer Father        dx in his early 12s  . Heart disease Father   . Prostate cancer Father   . Prostate cancer Brother        1/2 brother  . Hemochromatosis Brother   . Prostate cancer Brother   . Esophageal cancer Neg Hx   . Pancreatic cancer Neg Hx   . Stomach cancer Neg Hx   . Liver disease Neg Hx      Review of Systems: All other systems reviewed and are otherwise negative except as noted above.  Physical Exam: Vitals:   07/23/20 0931  BP: 120/72  Pulse: 84  SpO2: 94%  Weight: 163 lb 9.6 oz (74.2 kg)  Height: 5\' 10"  (1.778 m)     GEN- The patient is well appearing, alert and oriented x 3 today.   HEENT: normocephalic, atraumatic; sclera clear, conjunctiva pink; hearing intact; oropharynx clear; neck supple  Lungs- Clear to ausculation bilaterally, normal work of breathing.  No wheezes, rales, rhonchi Heart- Regular rate and rhythm, no murmurs, rubs or gallops  GI- soft, non-tender, non-distended, bowel sounds present  Extremities- no clubbing or cyanosis. No edema MS- no significant deformity or atrophy Skin- warm and dry, no rash or lesion; PPM pocket well healed Psych- euthymic mood, full affect Neuro- strength and sensation are intact  PPM Interrogation- reviewed in  detail today,  See PACEART report  EKG:  EKG is not ordered today.  Recent Labs: 07/15/2020: ALT 23; BUN 19; Creatinine 0.97; Hemoglobin 11.3; Platelet Count 121; Potassium 3.8; Sodium 140   Wt Readings from Last 3 Encounters:  07/23/20 163 lb 9.6 oz (74.2 kg)  07/15/20 170 lb 6.4 oz (77.3 kg)  07/12/20 162 lb (73.5 kg)     Other studies Reviewed: Additional studies/ records that were reviewed today include: Previous EP office notes, Previous remote checks, Most recent labwork.   Assessment and Plan:  1. Advanced AV block s/p Medtronic PPM  Normal PPM function See Pace Art report No changes today.   2. SCAF -> now persistent atrial fibrillation Identified at last visit, Pt had multiple episodes of prolonged AF. 30+hrs, 80+hrs, and currently in an AF episode since 11/21 (30 days +).  Continue Xarelto 20 mg daily. He has not missed a dose. Will be 3 weeks on 07/26/20. Will plan [facility] next week.  He is NOT TEE candidate with inability to pass probe and h/o esophageal stricture Would be potential tikosyn candidate. Baseline QTc ~ 420 when corrected for paced QRS.  Vs amiodarone.  ? Flecainide, would need to update Echo. No known CAD  3. Volume overload / Chronic ?diastolic CHF LVEF 01/3418 37-90%.  Much improved on daily lasix. Will plan on updating echo s/p [facility] for AAD consideration.   Continue lasix 40 mg daily for now. Repeat labs today. Suspect he will not need lasix as often once NSR restored.   4. HTN No change today.   5. Small cell lung cancer S/p Video bronchoscopy, robotic-assisted right superior segmentectomy with lymph node sampling and intercostal nerve block 03/11/2020 Pt is undergoing systemic chemotherapy with carboplatin for AUC of 5 on day 1 and etoposide 100 mg/M2 on days 1, 2 and 3  with Neulasta support every 3 weeks. Status post 3cycles His chemotherapy has been complicated by volume overload currently managed on lasix as above.  6. H/o CRAO Continue  ASA for now.   Current medicines are reviewed at length with the patient today.   The patient does not have concerns regarding his medicines.  The following changes were made today:  none  Labs/ tests ordered today include:  Orders Placed This Encounter  Procedures  . Basic Metabolic Panel (BMET)  . CBC w/Diff    Disposition:   Follow up with AF clinic 2 weeks post DCCV. Dr. Caryl Comes 2 months.  Pt will need updated echo s/p [facility] to consider AAD options  Signed, Shirley Friar, PA-C  07/23/2020 10:00 AM  [place] [id number] [phone] (office) [phone] (fax)

## 2020-07-23 NOTE — H&P (View-Only) (Signed)
Electrophysiology Office Note Date: 07/23/2020  ID:  Alexander Murray, DOB 06-14-1941, MRN [id number]  PCP: [name], MD Primary Cardiologist: No primary care provider on file. Electrophysiologist: [name], MD   CC: Pacemaker follow-up  Alexander Murray is a 79 y.o. male seen today for [name], MD for routine electrophysiology followup.  Pt was scheduled for TEE/DCC, but inability to pass probe in setting of h/o esophageal stricture, this was abandoned. Pt will have been on un-interrupted [place] x 3 weeks as of Friday, 12/24, and thus his earliest eligibility for plain [facility] will be next week.  he denies chest pain, palpitations, dyspnea, PND, orthopnea, nausea, vomiting, dizziness, syncope, edema, weight gain, or early satiety.  Device History: Medtronic Dual Chamber PPM implanted 2106 for symptomatic high grade heart block  DATE TEST EF   4/16 Myoview % No Ischemia  4/16 Echo 55-60% LAE mild (42/3.2/24)          Past Medical History:  Diagnosis Date  . Allergy   . Arthritis    "right knee and right thumb" (12/19/2014)  . Asthma    pt denies  . Bronchitis 07/2016   started in December and continued about 2 months  . Esophageal stricture   . GERD (gastroesophageal reflux disease)   . Hemochromatosis   . Hiatal hernia   . Hypercalcemia   . Hypercholesteremia   . Hypertension   . Internal hemorrhoids   . Lung cancer ([place])   . Osteoarthritis   . Palpitations   . Presence of permanent cardiac pacemaker    hx bradycardia  . Retinal artery occlusion, branch    "right eye"  . Second degree AV block, Mobitz type II   . Skin cancer    right shoulder  . Stroke [facility]) ~ 2011   "right eye"/ partial blindness  . Tubular adenoma of colon    Past Surgical History:  Procedure Laterality Date  . CATARACT EXTRACTION, BILATERAL  01/2017  . COLONOSCOPY    . EP IMPLANTABLE DEVICE N/A 12/19/2014   Procedure: Pacemaker Implant;  Surgeon: Deboraha Sprang, MD;   Location: [place] [facility];  Service: Cardiovascular;  Laterality: N/A;  . ESOPHAGOGASTRODUODENOSCOPY (EGD) WITH ESOPHAGEAL DILATION  X 3  . EYE SURGERY Bilateral 2017  . INGUINAL HERNIA REPAIR Left 1980's  . INSERT / REPLACE / REMOVE PACEMAKER  12/19/2014  . INTERCOSTAL NERVE BLOCK Right 03/11/2020   Procedure: INTERCOSTAL NERVE BLOCK;  Surgeon: Grace Isaac, MD;  Location: [place];  Service: Thoracic;  Laterality: Right;  . JOINT REPLACEMENT Right 2016  . KNEE ARTHROSCOPY Right ~ 1982; ~ 1992  . MOHS SURGERY Right ~ 2014   "pre-melanoma scapula"  . POSTERIOR TIBIAL TENDON REPAIR Left 2012  . TONSILLECTOMY  1946  . TOTAL KNEE ARTHROPLASTY Right 05/20/2015   Procedure: RIGHT TOTAL KNEE ARTHROPLASTY;  Surgeon: Paralee Cancel, MD;  Location: [facility];  Service: Orthopedics;  Laterality: Right;  . UPPER GASTROINTESTINAL ENDOSCOPY    . VIDEO BRONCHOSCOPY N/A 03/11/2020   Procedure: VIDEO BRONCHOSCOPY;  Surgeon: Grace Isaac, MD;  Location: [place];  Service: Thoracic;  Laterality: N/A;  . XI ROBOTIC ASSISTED THORACOSCOPY- SEGMENTECTOMY Right 03/11/2020   Procedure: XI ROBOTIC ASSISTED THORACOSCOPY-RIGHT SUPERIOR SEGMENTECTOMY WITH NODE SAMPLES;  Surgeon: Grace Isaac, MD;  Location: [place];  Service: Thoracic;  Laterality: Right;    Current Outpatient Medications  Medication Sig Dispense Refill  . acetaminophen (TYLENOL) 325 MG tablet Take 2 tablets (650 mg total) by mouth every  6 (six) hours as needed for mild pain or moderate pain.    Marland Kitchen amLODipine (NORVASC) 2.5 MG tablet Take 2.5 mg by mouth at bedtime.    Marland Kitchen aspirin 81 MG tablet Take 81 mg by mouth daily.    . celecoxib (CELEBREX) 200 MG capsule Take 1 capsule (200 mg total) by mouth 2 (two) times daily as needed for mild pain. 10 capsule 0  . chlorthalidone (HYGROTON) 25 MG tablet Take 25 mg by mouth every other day.     . doxazosin (CARDURA) 4 MG tablet Take 4 mg by mouth daily.     Marland Kitchen esomeprazole (NEXIUM) 20 MG packet Take 20 mg by  mouth daily before breakfast.    . furosemide (LASIX) 40 MG tablet Take 1 tablet (40 mg total) by mouth daily. 90 tablet 3  . Glycerin-Hypromellose-PEG 400 (DRY EYE RELIEF DROPS) 0.2-0.2-1 % SOLN Place 1 drop into both eyes daily as needed (Dry eye). Saline    . magic mouthwash SOLN Take 5 mLs by mouth 4 (four) times daily as needed for mouth pain. Components- Benadryl 12.5 mg /5 ml Hydrocortisone 60.mg  Nystatin 30 ml Swish and spit or swish and swallow 240 mL 0  . Misc Natural Products (GLUCOSAMINE CHONDROITIN ADV PO) Take 1,500 mg by mouth at bedtime.     . montelukast (SINGULAIR) 10 MG tablet Take 10 mg by mouth every morning.     . Multiple Vitamin (MULTIVITAMIN) capsule Take 1 capsule by mouth daily.    . Multiple Vitamins-Minerals (PRESERVISION AREDS 2 PO) Take 1 capsule by mouth in the morning and at bedtime.     . potassium chloride SA (KLOR-CON) 20 MEQ tablet Take 2 tablets (40 meq total) ONCE, then take 1 tablet (20 meq total) daily along with your lasix. 34 tablet 6  . rivaroxaban (XARELTO) 20 MG TABS tablet Take 1 tablet (20 mg total) by mouth daily with supper. 30 tablet 11  . simvastatin (ZOCOR) 40 MG tablet Take 40 mg by mouth at bedtime.     . vitamin C (ASCORBIC ACID) 500 MG tablet Take 500 mg by mouth at bedtime.      No current facility-administered medications for this visit.    Allergies:   Patient has no known allergies.   Social History: Social History   Socioeconomic History  . Marital status: Married    Spouse name: Not on file  . Number of children: Not on file  . Years of education: Not on file  . Highest education level: Not on file  Occupational History  . Occupation: retired - Doctor, general practice  Tobacco Use  . Smoking status: Former Smoker    Years: 3.00    Types: Cigarettes    Quit date: 05/16/1962    Years since quitting: 58.2  . Smokeless tobacco: Never Used  . Tobacco comment: "quit smoking in the 1960's"; was an intermittent smoker   Vaping  Use  . Vaping Use: Never used  Substance and Sexual Activity  . Alcohol use: No    Comment: 12/19/2014 "stopped drinking in ~ 2012"  . Drug use: No  . Sexual activity: Yes  Other Topics Concern  . Not on file  Social History Narrative  . Not on file   Social Determinants of Health   Financial Resource Strain: Not on file  Food Insecurity: Not on file  Transportation Needs: Not on file  Physical Activity: Not on file  Stress: Not on file  Social Connections: Not on file  Intimate  Partner Violence: Not on file    Family History: Family History  Problem Relation Age of Onset  . Colon cancer Mother        dx in her 17s  . Heart disease Mother   . Alzheimer's disease Mother   . Colon cancer Father        dx in his early 68s  . Heart disease Father   . Prostate cancer Father   . Prostate cancer Brother        1/2 brother  . Hemochromatosis Brother   . Prostate cancer Brother   . Esophageal cancer Neg Hx   . Pancreatic cancer Neg Hx   . Stomach cancer Neg Hx   . Liver disease Neg Hx      Review of Systems: All other systems reviewed and are otherwise negative except as noted above.  Physical Exam: Vitals:   07/23/20 0931  BP: 120/72  Pulse: 84  SpO2: 94%  Weight: 163 lb 9.6 oz (74.2 kg)  Height: 5\' 10"  (1.778 m)     GEN- The patient is well appearing, alert and oriented x 3 today.   HEENT: normocephalic, atraumatic; sclera clear, conjunctiva pink; hearing intact; oropharynx clear; neck supple  Lungs- Clear to ausculation bilaterally, normal work of breathing.  No wheezes, rales, rhonchi Heart- Regular rate and rhythm, no murmurs, rubs or gallops  GI- soft, non-tender, non-distended, bowel sounds present  Extremities- no clubbing or cyanosis. No edema MS- no significant deformity or atrophy Skin- warm and dry, no rash or lesion; PPM pocket well healed Psych- euthymic mood, full affect Neuro- strength and sensation are intact  PPM Interrogation- reviewed in  detail today,  See PACEART report  EKG:  EKG is not ordered today.  Recent Labs: 07/15/2020: ALT 23; BUN 19; Creatinine 0.97; Hemoglobin 11.3; Platelet Count 121; Potassium 3.8; Sodium 140   Wt Readings from Last 3 Encounters:  07/23/20 163 lb 9.6 oz (74.2 kg)  07/15/20 170 lb 6.4 oz (77.3 kg)  07/12/20 162 lb (73.5 kg)     Other studies Reviewed: Additional studies/ records that were reviewed today include: Previous EP office notes, Previous remote checks, Most recent labwork.   Assessment and Plan:  1. Advanced AV block s/p Medtronic PPM  Normal PPM function See Pace Art report No changes today.   2. SCAF -> now persistent atrial fibrillation Identified at last visit, Pt had multiple episodes of prolonged AF. 30+hrs, 80+hrs, and currently in an AF episode since 11/21 (30 days +).  Continue Xarelto 20 mg daily. He has not missed a dose. Will be 3 weeks on 07/26/20. Will plan [facility] next week.  He is NOT TEE candidate with inability to pass probe and h/o esophageal stricture Would be potential tikosyn candidate. Baseline QTc ~ 420 when corrected for paced QRS.  Vs amiodarone.  ? Flecainide, would need to update Echo. No known CAD  3. Volume overload / Chronic ?diastolic CHF LVEF 08/270 53-66%.  Much improved on daily lasix. Will plan on updating echo s/p [facility] for AAD consideration.   Continue lasix 40 mg daily for now. Repeat labs today. Suspect he will not need lasix as often once NSR restored.   4. HTN No change today.   5. Small cell lung cancer S/p Video bronchoscopy, robotic-assisted right superior segmentectomy with lymph node sampling and intercostal nerve block 03/11/2020 Pt is undergoing systemic chemotherapy with carboplatin for AUC of 5 on day 1 and etoposide 100 mg/M2 on days 1, 2 and 3  with Neulasta support every 3 weeks. Status post 3cycles His chemotherapy has been complicated by volume overload currently managed on lasix as above.  6. H/o CRAO Continue  ASA for now.   Current medicines are reviewed at length with the patient today.   The patient does not have concerns regarding his medicines.  The following changes were made today:  none  Labs/ tests ordered today include:  Orders Placed This Encounter  Procedures  . Basic Metabolic Panel (BMET)  . CBC w/Diff    Disposition:   Follow up with AF clinic 2 weeks post DCCV. Dr. Caryl Comes 2 months.  Pt will need updated echo s/p [facility] to consider AAD options  Signed, Shirley Friar, PA-C  07/23/2020 10:00 AM  [address] [place] [address] [phone] (office) [phone] (fax)

## 2020-07-23 NOTE — Patient Instructions (Addendum)
Medication Instructions:  *If you need a refill on your cardiac medications before your next appointment, please call your pharmacy*  Lab Work: Your physician has recommended that you have lab work today: BMET and CBC If you have labs (blood work) drawn today and your tests are completely normal, you will receive your results only by: Marland Kitchen MyChart Message (if you have MyChart) OR . A paper copy in the mail If you have any lab test that is abnormal or we need to change your treatment, we will call you to review the results.  Testing/Procedures: Your physician has recommended that you have a Cardioversion (DCCV). Electrical Cardioversion uses a jolt of electricity to your heart either through paddles or wired patches attached to your chest. This is a controlled, usually prescheduled, procedure. Defibrillation is done under light anesthesia in the hospital, and you usually go home the day of the procedure. This is done to get your heart back into a normal rhythm. You are not awake for the procedure. Please see the instruction sheet given to you today.  Follow-Up: At [facility], you and your health needs are our priority.  As part of our continuing mission to provide you with exceptional heart care, we have created designated Provider Care Teams.  These Care Teams include your primary Cardiologist (physician) and Advanced Practice Providers (APPs -  Physician Assistants and Nurse Practitioners) who all work together to provide you with the care you need, when you need it.  We recommend signing up for the patient portal called "MyChart".  Sign up information is provided on this After Visit Summary.  MyChart is used to connect with patients for Virtual Visits (Telemedicine).  Patients are able to view lab/test results, encounter notes, upcoming appointments, etc.  Non-urgent messages can be sent to your provider as well.   To learn more about what you can do with MyChart, go to [email or web address].     Your next appointment:   Your physician recommends that you schedule a follow-up appointment in: 2 WEEKS from 07/29/20 with the [place] [facility] physician recommends that you schedule a follow-up appointment in: 3 MONTHS with Dr. Caryl Comes  The format for your next appointment:   In Person with [name], MD   ------------------------------------------------- Cardioversion Information --------------------------------------------------- -- You are scheduled for a cardioversion on 07/29/20 at 10:00 an with Dr. Marlou Porch. -- Please go to [facility] [place] Stay at 7:00 am.  -- You will have a Rapid Covid Screening once you are registered and checked in for your procedure. -- Leisure centre manager through the [place] -- Do not have any food or drink after midnight on Sunday 07/28/20.  -- You may take your regular morning medications, EXCEPT Furosemide, with a sip of water on the day of your procedure.  -- You will need someone to drive you home following your procedure.   Call the [place] office at [phone] if you have any questions, problems or concerns.     Electrical Cardioversion Electrical cardioversion is the delivery of a jolt of electricity to change the rhythm of the heart. Sticky patches or metal paddles are placed on the chest to deliver the electricity from a device. This is done to restore a normal rhythm. A rhythm that is too fast or not regular keeps the heart from pumping well. Electrical cardioversion is done in an emergency if:   There is low or no blood  pressure as a result of the heart rhythm.   Normal rhythm must be restored as fast as possible to protect the brain and heart from further damage.   It may save a life. Cardioversion may be done for heart rhythms that are not immediately life threatening, such as atrial fibrillation or flutter, in which:   The heart is beating too  fast or is not regular.   Medicine to change the rhythm has not worked.   It is safe to wait in order to allow time for preparation.  Symptoms of the abnormal rhythm are bothersome.  The risk of stroke and other serious problems can be reduced.  LET [facility] CARE PROVIDER KNOW ABOUT:   Any allergies you have.  All medicines you are taking, including vitamins, herbs, eye drops, creams, and over-the-counter medicines.  Previous problems you or members of your family have had with the use of anesthetics.   Any blood disorders you have.   Previous surgeries you have had.   Medical conditions you have.   RISKS AND COMPLICATIONS  Generally, this is a safe procedure. However, problems can occur and include:   Breathing problems related to the anesthetic used.  A blood clot that breaks free and travels to other parts of your body. This could cause a stroke or other problems. The risk of this is lowered by use of blood-thinning medicine (anticoagulant) prior to the procedure.  Cardiac arrest (rare).   BEFORE THE PROCEDURE   You may have tests to detect blood clots in your heart and to evaluate heart function.  You may start taking anticoagulants so your blood does not clot as easily.   Medicines may be given to help stabilize your heart rate and rhythm.   PROCEDURE  You will be given medicine through an IV tube to reduce discomfort and make you sleepy (sedative).   An electrical shock will be delivered.   AFTER THE PROCEDURE Your heart rhythm will be watched to make sure it does not change. You will need someone to drive you home

## 2020-07-24 LAB — CBC WITH DIFFERENTIAL/PLATELET
Basophils Absolute: 0.1 10*3/uL (ref 0.0–0.2)
Basos: 1 %
EOS (ABSOLUTE): 0.2 10*3/uL (ref 0.0–0.4)
Eos: 3 %
Hematocrit: 34.3 % — ABNORMAL LOW (ref 37.5–51.0)
Hemoglobin: 11.7 g/dL — ABNORMAL LOW (ref 13.0–17.7)
Immature Grans (Abs): 0 10*3/uL (ref 0.0–0.1)
Immature Granulocytes: 0 %
Lymphocytes Absolute: 0.8 10*3/uL (ref 0.7–3.1)
Lymphs: 14 %
MCH: 32.2 pg (ref 26.6–33.0)
MCHC: 34.1 g/dL (ref 31.5–35.7)
MCV: 95 fL (ref 79–97)
Monocytes Absolute: 0.7 10*3/uL (ref 0.1–0.9)
Monocytes: 12 %
Neutrophils Absolute: 4 10*3/uL (ref 1.4–7.0)
Neutrophils: 70 %
Platelets: 90 10*3/uL — CL (ref 150–450)
RBC: 3.63 x10E6/uL — ABNORMAL LOW (ref 4.14–5.80)
RDW: 15 % (ref 11.6–15.4)
WBC: 5.8 10*3/uL (ref 3.4–10.8)

## 2020-07-24 LAB — BASIC METABOLIC PANEL
BUN/Creatinine Ratio: 18 (ref 10–24)
BUN: 18 mg/dL (ref 8–27)
CO2: 26 mmol/L (ref 20–29)
Calcium: 10.1 mg/dL (ref 8.6–10.2)
Chloride: 100 mmol/L (ref 96–106)
Creatinine, Ser: 1 mg/dL (ref 0.76–1.27)
GFR calc Af Amer: 81 mL/min/{1.73_m2} (ref >59)
GFR calc non Af Amer: 70 mL/min/{1.73_m2} (ref >59)
Glucose: 105 mg/dL — ABNORMAL HIGH (ref 65–99)
Potassium: 3.9 mmol/L (ref 3.5–5.2)
Sodium: 138 mmol/L (ref 134–144)

## 2020-07-28 NOTE — Progress Notes (Signed)
Pre-call completed for additional screening for day before procedure.  Pt denies having signs/symptoms of Covid (fever, cold symptoms, body aches, etc.)  Pt aware he is scheduled for rapid Covid test and is planning to arrive by 7am on 07/29/20.

## 2020-07-28 NOTE — Anesthesia Preprocedure Evaluation (Addendum)
Anesthesia Evaluation  Patient identified by MRN, date of birth, ID band Patient awake    Reviewed: Allergy & Precautions, NPO status , Patient's Chart, lab work & pertinent test results  Airway Mallampati: II  TM Distance: >3 FB Neck ROM: Full    Dental  (+) Teeth Intact, Dental Advisory Given   Pulmonary former smoker,  Quit smoking 1963 Hx RLL small cell lung ca s/p segmentectomy 03/2020   Pulmonary exam normal breath sounds clear to auscultation       Cardiovascular hypertension, Pt. on medications Normal cardiovascular exam+ dysrhythmias (xarelto) Atrial Fibrillation + pacemaker (bradycardia, 2nd degree AVB)  Rhythm:Regular Rate:Normal  last echo 2016: - Left ventricle: The cavity size was normal. Wall thickness was  normal. Systolic function was normal. The estimated ejection  fraction was in the range of 55% to 60%. Wall motion was normal;  there were no regional wall motion abnormalities.  - Ascending aorta: The ascending aorta was mildly dilated.  - Mitral valve: Calcified annulus.  - Left atrium: The atrium was moderately dilated.  - Pulmonary arteries: PA peak pressure: 40 mm Hg (S).   Attempted TEE earlier this month, unable to pass echo probe d/t esophageal stricture    Neuro/Psych CVA (2011, right eye partial blindness), Residual Symptoms negative psych ROS   GI/Hepatic Neg liver ROS, hiatal hernia, GERD  Medicated and Controlled,has history of esophageal stricture and aspiration- was intubated for last TEE in early Dec 2021 with some difficulty- could not pass size 7.0 ETT, able to pass size 6.5   Difficulty passing echo probe as well   Endo/Other  negative endocrine ROS  Renal/GU negative Renal ROS  negative genitourinary   Musculoskeletal  (+) Arthritis , Osteoarthritis,    Abdominal   Peds  Hematology  (+) Blood dyscrasia, anemia , hct 34.3, plt 90 S/p 4 cycles of chemo requiring neulasta     Anesthesia Other Findings   Reproductive/Obstetrics negative OB ROS                           Anesthesia Physical Anesthesia Plan  ASA: III  Anesthesia Plan: General   Post-op Pain Management:    Induction: Intravenous  PONV Risk Score and Plan: Treatment may vary due to age or medical condition  Airway Management Planned: Mask and Natural Airway  Additional Equipment: None  Intra-op Plan:   Post-operative Plan:   Informed Consent: I have reviewed the patients History and Physical, chart, labs and discussed the procedure including the risks, benefits and alternatives for the proposed anesthesia with the patient or authorized representative who has indicated his/her understanding and acceptance.     Dental advisory given  Plan Discussed with: CRNA  Anesthesia Plan Comments: (Unable to pass TEE probe during attempted TEE/CV early December. Pt states throat is still sore. Also required smaller ETT during that procedure (6.5). D/w pt hoping to avoid manipulating the airway.)      Anesthesia Quick Evaluation

## 2020-07-29 ENCOUNTER — Encounter (HOSPITAL_COMMUNITY)
Admission: RE | Disposition: A | Discharge status: Home / Self Care | Payer: Self-pay | Source: Home / Self Care | Attending: Cardiology

## 2020-07-29 ENCOUNTER — Ambulatory Visit (HOSPITAL_COMMUNITY): Payer: Medicare Other | Admitting: Anesthesiology

## 2020-07-29 ENCOUNTER — Ambulatory Visit (HOSPITAL_COMMUNITY)
Admission: RE | Admit: 2020-07-29 | Discharge: 2020-07-29 | Disposition: A | Discharge status: Home / Self Care | Payer: Medicare Other | Attending: Cardiology | Admitting: Cardiology

## 2020-07-29 ENCOUNTER — Other Ambulatory Visit: Payer: Self-pay

## 2020-07-29 DIAGNOSIS — I110 Hypertensive heart disease with heart failure: Secondary | ICD-10-CM | POA: Diagnosis not present

## 2020-07-29 DIAGNOSIS — Z7901 Long term (current) use of anticoagulants: Secondary | ICD-10-CM | POA: Insufficient documentation

## 2020-07-29 DIAGNOSIS — Z87891 Personal history of nicotine dependence: Secondary | ICD-10-CM | POA: Diagnosis not present

## 2020-07-29 DIAGNOSIS — J45909 Unspecified asthma, uncomplicated: Secondary | ICD-10-CM | POA: Diagnosis not present

## 2020-07-29 DIAGNOSIS — I441 Atrioventricular block, second degree: Secondary | ICD-10-CM | POA: Diagnosis not present

## 2020-07-29 DIAGNOSIS — E7800 Pure hypercholesterolemia, unspecified: Secondary | ICD-10-CM | POA: Diagnosis not present

## 2020-07-29 DIAGNOSIS — I4819 Other persistent atrial fibrillation: Secondary | ICD-10-CM

## 2020-07-29 DIAGNOSIS — Z79899 Other long term (current) drug therapy: Secondary | ICD-10-CM | POA: Insufficient documentation

## 2020-07-29 DIAGNOSIS — I5032 Chronic diastolic (congestive) heart failure: Secondary | ICD-10-CM | POA: Insufficient documentation

## 2020-07-29 DIAGNOSIS — C3490 Malignant neoplasm of unspecified part of unspecified bronchus or lung: Secondary | ICD-10-CM | POA: Diagnosis not present

## 2020-07-29 DIAGNOSIS — Z7982 Long term (current) use of aspirin: Secondary | ICD-10-CM | POA: Insufficient documentation

## 2020-07-29 DIAGNOSIS — Z950 Presence of cardiac pacemaker: Secondary | ICD-10-CM | POA: Diagnosis not present

## 2020-07-29 DIAGNOSIS — Z20822 Contact with and (suspected) exposure to covid-19: Secondary | ICD-10-CM | POA: Diagnosis not present

## 2020-07-29 DIAGNOSIS — I10 Essential (primary) hypertension: Secondary | ICD-10-CM | POA: Diagnosis not present

## 2020-07-29 HISTORY — PX: CARDIOVERSION: SHX1299

## 2020-07-29 LAB — POCT I-STAT, CHEM 8
BUN: 18 mg/dL (ref 8–23)
Calcium, Ion: 1.42 mmol/L — ABNORMAL HIGH (ref 1.15–1.40)
Chloride: 102 mmol/L (ref 98–111)
Creatinine, Ser: 0.9 mg/dL (ref 0.61–1.24)
Glucose, Bld: 97 mg/dL (ref 70–99)
HCT: 36 % — ABNORMAL LOW (ref 39.0–52.0)
Hemoglobin: 12.2 g/dL — ABNORMAL LOW (ref 13.0–17.0)
Potassium: 3.8 mmol/L (ref 3.5–5.1)
Sodium: 139 mmol/L (ref 135–145)
TCO2: 27 mmol/L (ref 22–32)

## 2020-07-29 LAB — SARS CORONAVIRUS 2 BY RT PCR (HOSPITAL ORDER, PERFORMED IN ~~LOC~~ HOSPITAL LAB): SARS Coronavirus 2: NEGATIVE

## 2020-07-29 SURGERY — CARDIOVERSION
Anesthesia: General

## 2020-07-29 MED ORDER — SODIUM CHLORIDE 0.9 % IV SOLN
INTRAVENOUS | Status: AC | PRN
  Administered 2020-07-29: 500 mL via INTRAMUSCULAR

## 2020-07-29 MED ORDER — LIDOCAINE HCL (CARDIAC) PF 100 MG/5ML IV SOSY
PREFILLED_SYRINGE | INTRAVENOUS | Status: DC | PRN
  Administered 2020-07-29: 100 mg via INTRATRACHEAL

## 2020-07-29 MED ORDER — PROPOFOL 10 MG/ML IV BOLUS
INTRAVENOUS | Status: DC | PRN
  Administered 2020-07-29: 50 mg via INTRAVENOUS

## 2020-07-29 NOTE — Interval H&P Note (Signed)
History and Physical Interval Note:  07/29/2020 9:19 AM  Alexander Murray  has presented today for surgery, with the diagnosis of AFIB.  The various methods of treatment have been discussed with the patient and family. After consideration of risks, benefits and other options for treatment, the patient has consented to  Procedure(s) with comments: CARDIOVERSION (N/A) - NEEDS RAPID COVID TEST MORNING OF as a surgical intervention.  The patient's history has been reviewed, patient examined, no change in status, stable for surgery.  I have reviewed the patient's chart and labs.  Questions were answered to the patient's satisfaction.    Prior TEE unsuccessful due to stricture.    [employer]

## 2020-07-29 NOTE — CV Procedure (Signed)
    Electrical Cardioversion Procedure Note Alexander Murray [id number] 09-05-1940  Procedure: Electrical Cardioversion Indications:  Atrial Fibrillation  Time Out: Verified patient identification, verified procedure,medications/allergies/relevent history reviewed, required imaging and test results available.  Performed  Procedure Details  The patient was NPO after midnight. Anesthesia was administered at the beside  by Dr. Christella Hartigan with propofol.  Cardioversion was performed with synchronized biphasic defibrillation via AP pads with 150 joules.  1 attempt(s) were performed.  The patient converted to normal sinus rhythm. The patient tolerated the procedure well   IMPRESSION:  Successful cardioversion of atrial fibrillation. Pacer, Medtronic, functioning normally. Interrogated.     Alexander Murray 07/29/2020, 10:26 AM

## 2020-07-29 NOTE — Anesthesia Postprocedure Evaluation (Signed)
Anesthesia Post Note  Patient: Alexander Murray  Procedure(s) Performed: CARDIOVERSION (N/A )     Patient location during evaluation: Endoscopy Anesthesia Type: General Level of consciousness: awake and alert Pain management: pain level controlled Vital Signs Assessment: post-procedure vital signs reviewed and stable Respiratory status: spontaneous breathing, nonlabored ventilation and respiratory function stable Cardiovascular status: blood pressure returned to baseline and stable Postop Assessment: no apparent nausea or vomiting Anesthetic complications: no   No complications documented.  Last Vitals:  Vitals:   07/29/20 1040 07/29/20 1050  BP: 135/85 138/72  Pulse: 75 73  Resp: (!) 21 (!) 21  Temp:    SpO2: 100% 100%    Last Pain:  Vitals:   07/29/20 1050  TempSrc:   PainSc: 0-No pain                 Lidia Collum

## 2020-07-29 NOTE — Transfer of Care (Signed)
Immediate Anesthesia Transfer of Care Note  Patient: Alexander Murray  Procedure(s) Performed: CARDIOVERSION (N/A )  Patient Location: Endoscopy Unit  Anesthesia Type:General  Level of Consciousness: awake, alert  and oriented  Airway & Oxygen Therapy: Patient connected to nasal cannula oxygen  Post-op Assessment: Post -op Vital signs reviewed and stable  Post vital signs: stable  Last Vitals:  Vitals Value Taken Time  BP    Temp    Pulse    Resp    SpO2      Last Pain:  Vitals:   07/29/20 0846  TempSrc: Oral  PainSc: 0-No pain         Complications: No complications documented.

## 2020-07-30 ENCOUNTER — Encounter (HOSPITAL_COMMUNITY): Payer: Self-pay | Admitting: Cardiology

## 2020-08-06 ENCOUNTER — Telehealth: Payer: Self-pay

## 2020-08-06 ENCOUNTER — Ambulatory Visit
Admission: RE | Admit: 2020-08-06 | Discharge: 2020-08-06 | Disposition: A | Discharge status: Home / Self Care | Payer: Medicare Other | Source: Ambulatory Visit | Attending: Cardiothoracic Surgery | Admitting: Cardiothoracic Surgery

## 2020-08-06 ENCOUNTER — Other Ambulatory Visit: Payer: Self-pay

## 2020-08-06 DIAGNOSIS — R0602 Shortness of breath: Secondary | ICD-10-CM

## 2020-08-06 IMAGING — DX DG CHEST 2V
2 series · 2 of 2 positions shown · non-contrast
Comparison: [DATE], CT chest [DATE]

CLINICAL DATA: Shortness of breath

EXAM:
CHEST - 2 VIEW

[dg chest 2 view (1 of 2)]
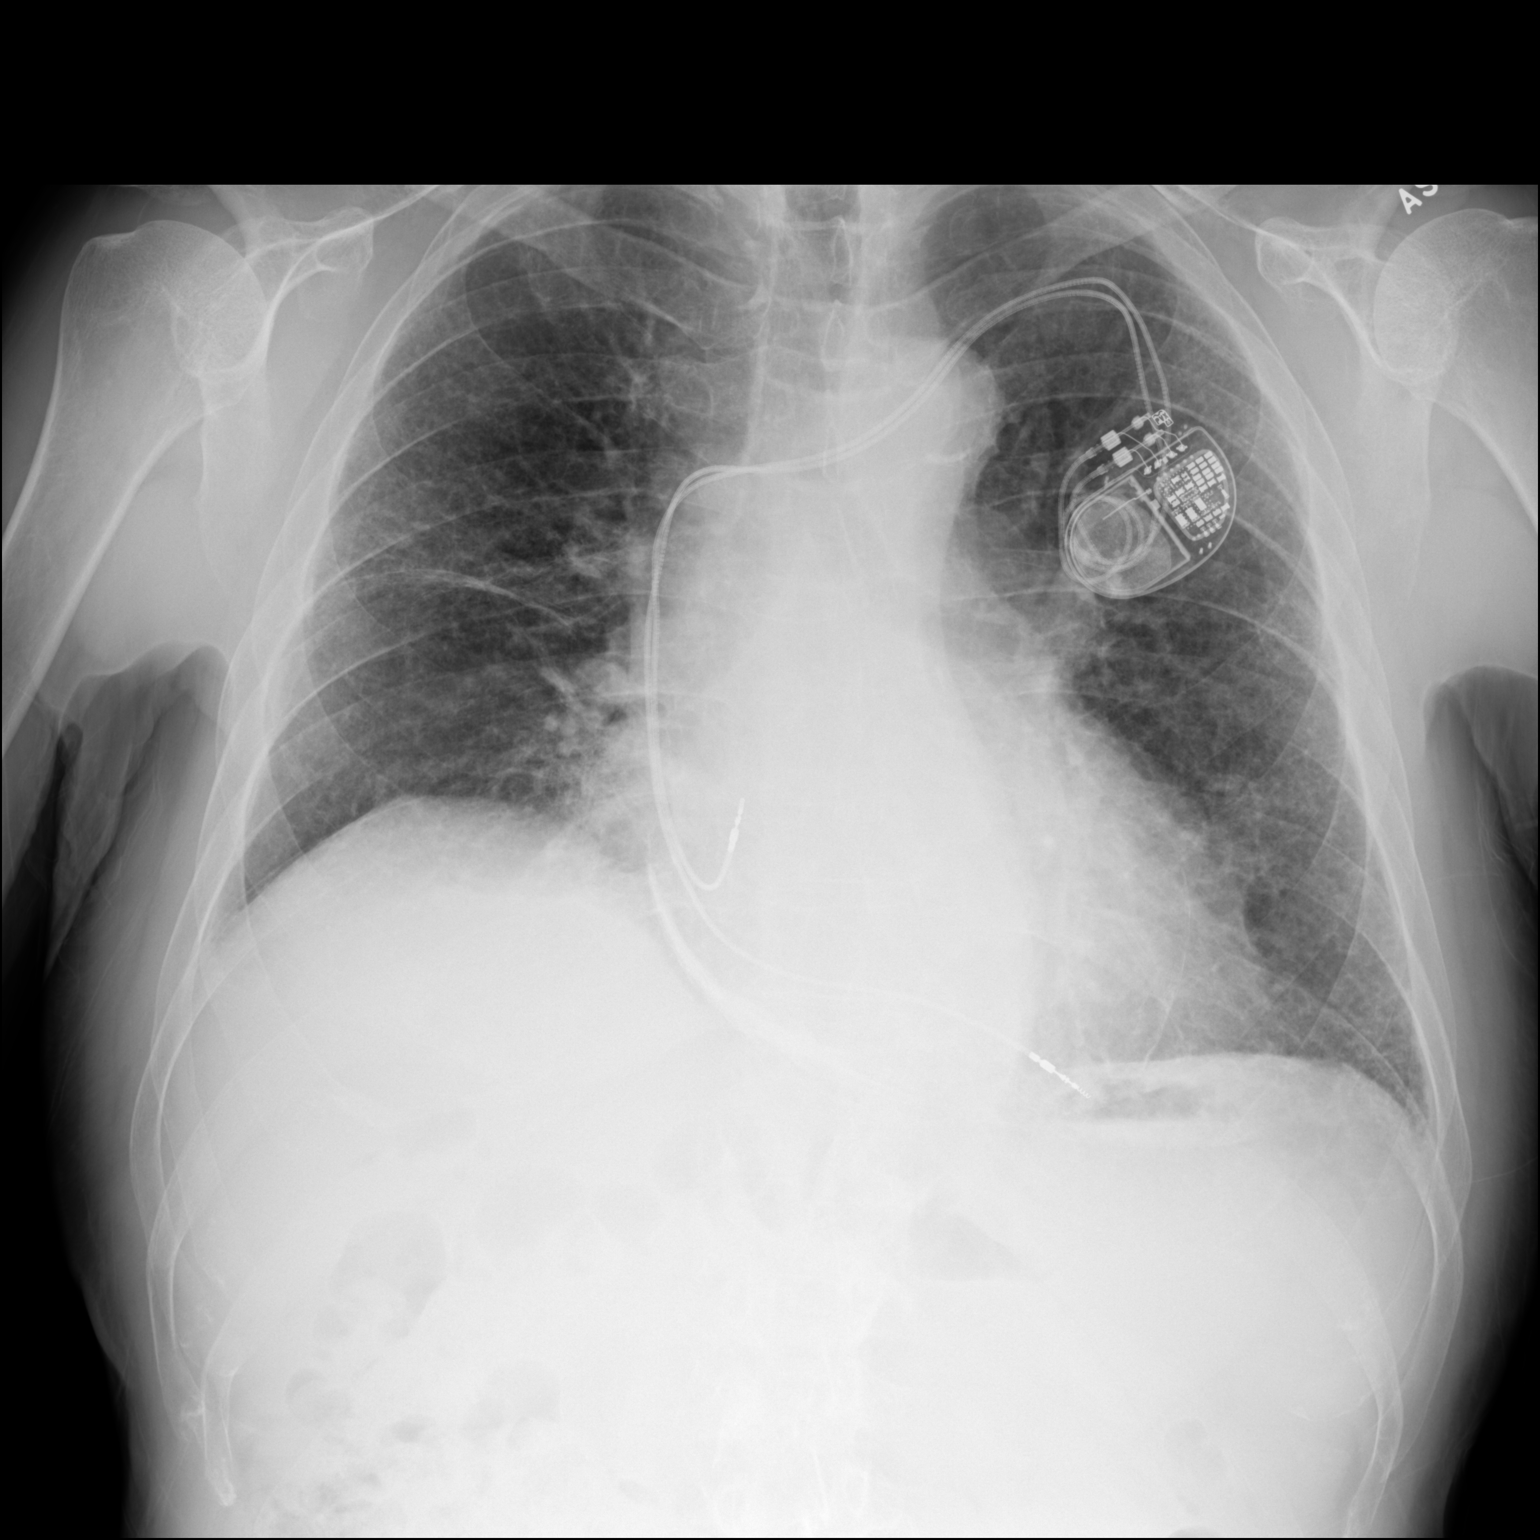

[dg chest 2 view (2 of 2)]
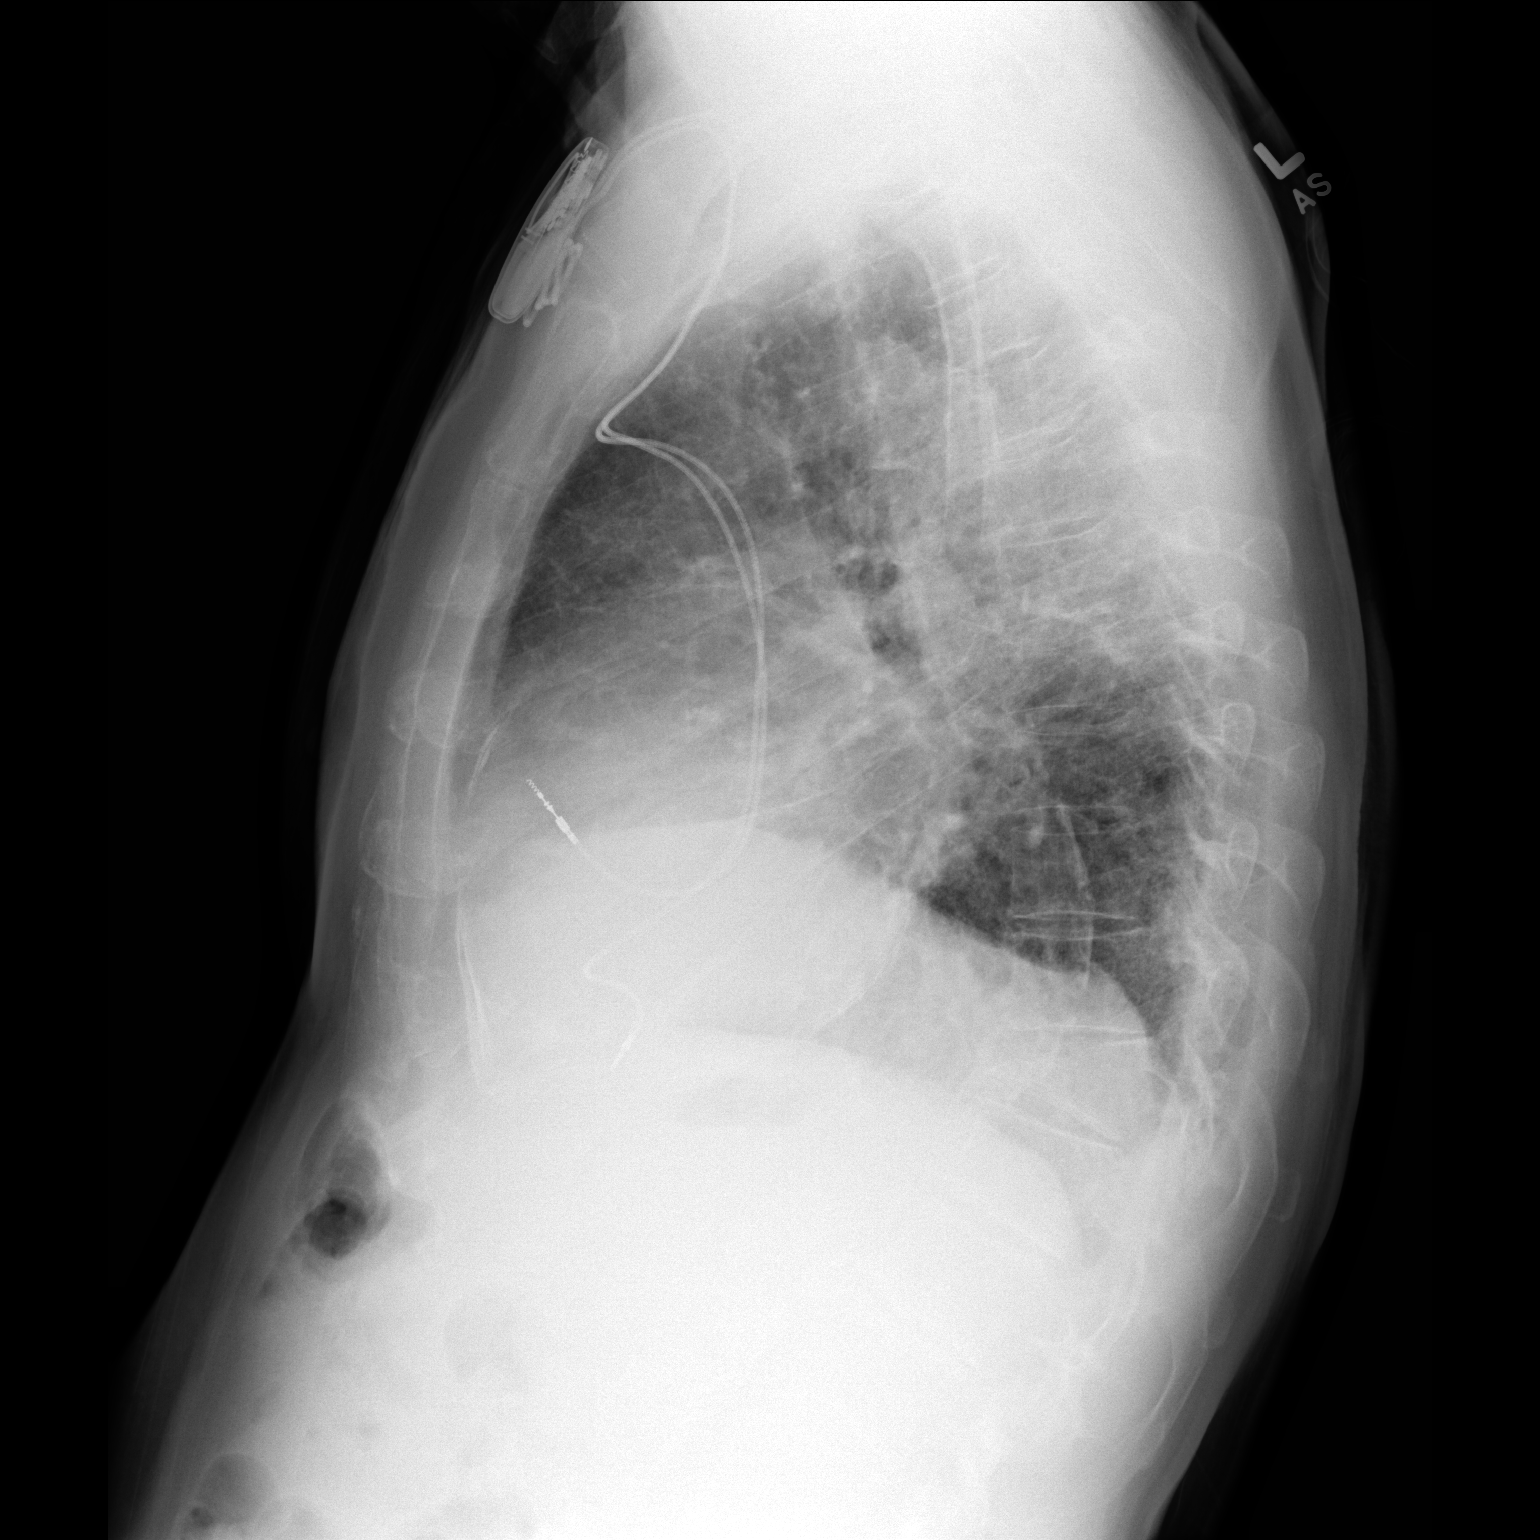

[2 of 2 positions shown; findings below may reference images not displayed]

FINDINGS: Left-sided pacing device as before. No focal opacity or pleural
effusion. Stable cardiomediastinal silhouette with aortic
atherosclerosis. Chronic elevation right diaphragm. Pericardial
calcifications as before. No pneumothorax.
IMPRESSION: No active cardiopulmonary disease.

## 2020-08-06 NOTE — Telephone Encounter (Signed)
Patient contacted the office concerned about continued cough and some shortness of breath.  He has been seen in the office for a pleural effusion following RATS right segmentectomy with Dr. Servando Snare 03/11/20.  Stated that he was to see Dr. Julien Nordmann in March with f/u chest xray/ CT to reevaluate pleural effusions with Dr. Servando Snare if necessary, but he started to have increased shortness of breath and lingering cough.  Advised that patient could get a chest xray downstairs and would call patient of results.  No changes in xray will make patient aware.

## 2020-08-08 ENCOUNTER — Telehealth: Payer: Self-pay

## 2020-08-08 DIAGNOSIS — R0600 Dyspnea, unspecified: Secondary | ICD-10-CM | POA: Diagnosis not present

## 2020-08-08 DIAGNOSIS — R748 Abnormal levels of other serum enzymes: Secondary | ICD-10-CM | POA: Diagnosis not present

## 2020-08-08 DIAGNOSIS — R058 Other specified cough: Secondary | ICD-10-CM | POA: Diagnosis not present

## 2020-08-08 DIAGNOSIS — I480 Paroxysmal atrial fibrillation: Secondary | ICD-10-CM | POA: Diagnosis not present

## 2020-08-08 NOTE — Telephone Encounter (Signed)
-----   Message from Grace Isaac, MD sent at 08/07/2020  7:12 AM EST ----- Please let Dr Wonda Olds know his chest xray yesterday looked good no effusion. He can have cough med- should see cardiology if having increasing sob   EBG

## 2020-08-08 NOTE — Telephone Encounter (Signed)
Patient contacted and made aware of his chest xray per Dr. Servando Snare.  Patient stated that he was currently seeing his PCP today for shortness of breath.

## 2020-08-12 ENCOUNTER — Encounter (HOSPITAL_COMMUNITY): Payer: Self-pay | Admitting: Physician Assistant

## 2020-08-12 ENCOUNTER — Ambulatory Visit (HOSPITAL_COMMUNITY)
Admission: RE | Admit: 2020-08-12 | Discharge: 2020-08-12 | Disposition: A | Discharge status: Home / Self Care | Payer: Medicare Other | Source: Ambulatory Visit | Attending: Physician Assistant | Admitting: Physician Assistant

## 2020-08-12 ENCOUNTER — Other Ambulatory Visit: Payer: Self-pay

## 2020-08-12 VITALS — BP 118/82 | HR 78 | Ht 70.0 in | Wt 170.0 lb

## 2020-08-12 DIAGNOSIS — Z8249 Family history of ischemic heart disease and other diseases of the circulatory system: Secondary | ICD-10-CM | POA: Diagnosis not present

## 2020-08-12 DIAGNOSIS — Z87891 Personal history of nicotine dependence: Secondary | ICD-10-CM | POA: Insufficient documentation

## 2020-08-12 DIAGNOSIS — I5032 Chronic diastolic (congestive) heart failure: Secondary | ICD-10-CM | POA: Insufficient documentation

## 2020-08-12 DIAGNOSIS — Z7982 Long term (current) use of aspirin: Secondary | ICD-10-CM | POA: Insufficient documentation

## 2020-08-12 DIAGNOSIS — I110 Hypertensive heart disease with heart failure: Secondary | ICD-10-CM | POA: Insufficient documentation

## 2020-08-12 DIAGNOSIS — Z8673 Personal history of transient ischemic attack (TIA), and cerebral infarction without residual deficits: Secondary | ICD-10-CM | POA: Insufficient documentation

## 2020-08-12 DIAGNOSIS — I441 Atrioventricular block, second degree: Secondary | ICD-10-CM | POA: Insufficient documentation

## 2020-08-12 DIAGNOSIS — Z888 Allergy status to other drugs, medicaments and biological substances status: Secondary | ICD-10-CM | POA: Insufficient documentation

## 2020-08-12 DIAGNOSIS — D6869 Other thrombophilia: Secondary | ICD-10-CM

## 2020-08-12 DIAGNOSIS — Z96651 Presence of right artificial knee joint: Secondary | ICD-10-CM | POA: Insufficient documentation

## 2020-08-12 DIAGNOSIS — Z8616 Personal history of COVID-19: Secondary | ICD-10-CM | POA: Insufficient documentation

## 2020-08-12 DIAGNOSIS — Z7901 Long term (current) use of anticoagulants: Secondary | ICD-10-CM | POA: Insufficient documentation

## 2020-08-12 DIAGNOSIS — Z950 Presence of cardiac pacemaker: Secondary | ICD-10-CM | POA: Diagnosis not present

## 2020-08-12 DIAGNOSIS — E785 Hyperlipidemia, unspecified: Secondary | ICD-10-CM | POA: Diagnosis not present

## 2020-08-12 DIAGNOSIS — I4819 Other persistent atrial fibrillation: Secondary | ICD-10-CM

## 2020-08-12 DIAGNOSIS — C3490 Malignant neoplasm of unspecified part of unspecified bronchus or lung: Secondary | ICD-10-CM | POA: Diagnosis not present

## 2020-08-12 DIAGNOSIS — Z79899 Other long term (current) drug therapy: Secondary | ICD-10-CM | POA: Diagnosis not present

## 2020-08-12 NOTE — Progress Notes (Addendum)
Primary Care Physician: [name], MD Primary Electrophysiologist: Dr Caryl Comes Referring Physician: Oda Kilts PA   Alexander Murray is a 80 y.o. 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 The patient previously had subclinical AF detected on his device but became persistent and was started on Xarelto for a CHADS2VASC score of 6. He was not able to undergo TEE guided DCCV 2/2 his esophageal strictures. After 3 weeks of anticoagulation he underwent DCCV on 07/29/20. Unfortunately, he is back in rate controlled afib. He does feel like he has more lower extremity edema recently. Otherwise he is unaware of his arrhythmia.   Today, he denies symptoms of palpitations, chest pain, shortness of breath, orthopnea, PND, lower extremity edema, dizziness, presyncope, syncope, snoring, daytime somnolence, bleeding, or neurologic sequela. The patient is tolerating medications without difficulties and is otherwise without complaint today.    Atrial Fibrillation Risk Factors:  he does not have symptoms or diagnosis of sleep apnea. he does not have a history of rheumatic fever.   he has a BMI of Body mass index is 24.39 kg/m.Marland Kitchen Filed Weights   08/12/20 0947  Weight: 77.1 kg    Family History  Problem Relation Age of Onset  . Colon cancer Mother        dx in her 41s  . Heart disease Mother   . Alzheimer's disease Mother   . Colon cancer Father        dx in his early 35s  . Heart disease Father   . Prostate cancer Father   . Prostate cancer Brother        1/2 brother  . Hemochromatosis Brother   . Prostate cancer Brother   . Esophageal cancer Neg Hx   . Pancreatic cancer Neg Hx   . Stomach cancer Neg Hx   . Liver disease Neg Hx      Atrial Fibrillation Management history:  Previous  antiarrhythmic drugs: none Previous cardioversions: 07/29/20 Previous ablations: none CHADS2VASC score: 6 Anticoagulation history: Xarelto   Past Medical History:  Diagnosis Date  . Allergy   . Arthritis    "right knee and right thumb" (12/19/2014)  . Asthma    pt denies  . Bronchitis 07/2016   started in December and continued about 2 months  . Esophageal stricture   . GERD (gastroesophageal reflux disease)   . Hemochromatosis   . Hiatal hernia   . Hypercalcemia   . Hypercholesteremia   . Hypertension   . Internal hemorrhoids   . Lung cancer ([place])   . Osteoarthritis   . Palpitations   . Presence of permanent cardiac pacemaker    hx bradycardia  . Retinal artery occlusion, branch    "right eye"  . Second degree AV block, Mobitz type II   . Skin cancer    right shoulder  . Stroke [facility]) ~ 2011   "right eye"/ partial blindness  . Tubular adenoma of colon    Past Surgical History:  Procedure Laterality Date  . CARDIOVERSION N/A 07/29/2020   Procedure: CARDIOVERSION;  Surgeon: Jerline Pain, MD;  Location: [facility] ENDOSCOPY;  Service: Cardiovascular;  Laterality: N/A;  NEEDS RAPID COVID TEST MORNING OF  . CATARACT EXTRACTION, BILATERAL  01/2017  . COLONOSCOPY    . EP IMPLANTABLE DEVICE N/A 12/19/2014   Procedure: Pacemaker Implant;  Surgeon:  Deboraha Sprang, MD;  Location: [place] CV LAB;  Service: Cardiovascular;  Laterality: N/A;  . ESOPHAGOGASTRODUODENOSCOPY (EGD) WITH ESOPHAGEAL DILATION  X 3  . EYE SURGERY Bilateral 2017  . INGUINAL HERNIA REPAIR Left 1980's  . INSERT / REPLACE / REMOVE PACEMAKER  12/19/2014  . INTERCOSTAL NERVE BLOCK Right 03/11/2020   Procedure: INTERCOSTAL NERVE BLOCK;  Surgeon: Grace Isaac, MD;  Location: [place];  Service: Thoracic;  Laterality: Right;  . JOINT REPLACEMENT Right 2016  . KNEE ARTHROSCOPY Right ~ 1982; ~ 1992  . MOHS SURGERY Right ~ 2014   "pre-melanoma scapula"  . POSTERIOR TIBIAL TENDON REPAIR Left 2012  . TONSILLECTOMY   1946  . TOTAL KNEE ARTHROPLASTY Right 05/20/2015   Procedure: RIGHT TOTAL KNEE ARTHROPLASTY;  Surgeon: Paralee Cancel, MD;  Location: [facility];  Service: Orthopedics;  Laterality: Right;  . UPPER GASTROINTESTINAL ENDOSCOPY    . VIDEO BRONCHOSCOPY N/A 03/11/2020   Procedure: VIDEO BRONCHOSCOPY;  Surgeon: Grace Isaac, MD;  Location: [place];  Service: Thoracic;  Laterality: N/A;  . XI ROBOTIC ASSISTED THORACOSCOPY- SEGMENTECTOMY Right 03/11/2020   Procedure: XI ROBOTIC ASSISTED THORACOSCOPY-RIGHT SUPERIOR SEGMENTECTOMY WITH NODE SAMPLES;  Surgeon: Grace Isaac, MD;  Location: [place];  Service: Thoracic;  Laterality: Right;    Current Outpatient Medications  Medication Sig Dispense Refill  . acetaminophen (TYLENOL) 325 MG tablet Take 2 tablets (650 mg total) by mouth every 6 (six) hours as needed for mild pain or moderate pain.    Marland Kitchen amLODipine (NORVASC) 2.5 MG tablet Take 2.5 mg by mouth at bedtime.    Marland Kitchen aspirin 81 MG tablet Take 81 mg by mouth daily.    . celecoxib (CELEBREX) 200 MG capsule Take 1 capsule (200 mg total) by mouth 2 (two) times daily as needed for mild pain. 10 capsule 0  . doxazosin (CARDURA) 4 MG tablet Take 4 mg by mouth daily.     Marland Kitchen esomeprazole (NEXIUM) 20 MG packet Take 20 mg by mouth daily before breakfast.    . fluticasone (FLONASE) 50 MCG/ACT nasal spray Place 1 spray into both nostrils daily.    . furosemide (LASIX) 40 MG tablet Take 1 tablet (40 mg total) by mouth daily. 90 tablet 3  . Glycerin-Hypromellose-PEG 400 (DRY EYE RELIEF DROPS) 0.2-0.2-1 % SOLN Place 1 drop into both eyes daily as needed (Dry eye). Saline    . HYDROcodone-homatropine (HYCODAN) 5-1.5 MG/5ML syrup Take 5ML twice daily as needed for cough    . Misc Natural Products (GLUCOSAMINE CHONDROITIN ADV PO) Take 1,500 mg by mouth at bedtime.     . montelukast (SINGULAIR) 10 MG tablet Take 10 mg by mouth every morning.     . Multiple Vitamin (MULTIVITAMIN) capsule Take 1 capsule by mouth daily.    .  Multiple Vitamins-Minerals (PRESERVISION AREDS 2 PO) Take 1 capsule by mouth in the morning and at bedtime.     . potassium chloride SA (KLOR-CON) 20 MEQ tablet Take 2 tablets (40 meq total) ONCE, then take 1 tablet (20 meq total) daily along with your lasix. 34 tablet 6  . rivaroxaban (XARELTO) 20 MG TABS tablet Take 1 tablet (20 mg total) by mouth daily with supper. 30 tablet 11  . simvastatin (ZOCOR) 40 MG tablet Take 40 mg by mouth at bedtime.     . vitamin C (ASCORBIC ACID) 500 MG tablet Take 500 mg by mouth at bedtime.      No current facility-administered medications for this encounter.  Allergies  Allergen Reactions  . Lisinopril     Other reaction(s): Dizzy/Palpitations    Social History   Socioeconomic History  . Marital status: Married    Spouse name: Not on file  . Number of children: Not on file  . Years of education: Not on file  . Highest education level: Not on file  Occupational History  . Occupation: retired - Doctor, general practice  Tobacco Use  . Smoking status: Former Smoker    Years: 3.00    Types: Cigarettes    Quit date: 05/16/1962    Years since quitting: 58.2  . Smokeless tobacco: Never Used  . Tobacco comment: "quit smoking in the 1960's"; was an intermittent smoker   Vaping Use  . Vaping Use: Never used  Substance and Sexual Activity  . Alcohol use: No    Comment: 12/19/2014 "stopped drinking in ~ 2012"  . Drug use: No  . Sexual activity: Yes  Other Topics Concern  . Not on file  Social History Narrative  . Not on file   Social Determinants of Health   Financial Resource Strain: Not on file  Food Insecurity: Not on file  Transportation Needs: Not on file  Physical Activity: Not on file  Stress: Not on file  Social Connections: Not on file  Intimate Partner Violence: Not on file     ROS- All systems are reviewed and negative except as per the HPI above.  Physical Exam: Vitals:   08/12/20 0947  BP: 118/82  Pulse: 78  Weight: 77.1  kg  Height: 5\' 10"  (1.778 m)    GEN- The patient is well appearing elderly male, alert and oriented x 3 today.   Head- normocephalic, atraumatic Eyes-  Sclera clear, conjunctiva pink Ears- hearing intact Oropharynx- clear Neck- supple  Lungs- Clear to ausculation bilaterally, normal work of breathing Heart- Regular rate and rhythm, no murmurs, rubs or gallops  GI- soft, NT, ND, + BS Extremities- no clubbing, cyanosis. 1+ bilateral edema MS- no significant deformity or atrophy Skin- no rash or lesion Psych- euthymic mood, full affect Neuro- strength and sensation are intact  Wt Readings from Last 3 Encounters:  08/12/20 77.1 kg  07/29/20 74.2 kg  07/23/20 74.2 kg    EKG today demonstrates  V paced rhythm with underlying afib Vent. rate 78 BPM QRS duration 166 ms QT/QTc 436/497 ms  Echo 12/10/14 demonstrated  Left ventricle: The cavity size was normal. Wall thickness was  normal. Systolic function was normal. The estimated ejection  fraction was in the range of 55% to 60%. Wall motion was normal;  there were no regional wall motion abnormalities.  - Ascending aorta: The ascending aorta was mildly dilated.  - Mitral valve: Calcified annulus.  - Left atrium: The atrium was moderately dilated.  - Pulmonary arteries: PA peak pressure: 40 mm Hg (S).   Impressions:   - Second degree AV block is seen during the study.   Epic records are reviewed at length today  CHA2DS2-VASc Score = 6  The patient's score is based upon: CHF History: Yes HTN History: Yes Diabetes History: No Stroke History: Yes Vascular Disease History: No Age Score: 2 Gender Score: 0      ASSESSMENT AND PLAN: 1. Persistent Atrial Fibrillation (ICD10:  I48.19) The patient's CHA2DS2-VASc score is 6, indicating a 9.7% annual risk of stroke.   We discussed therapeutic options today including AAD.  Will check echocardiogram. If EF normal, can consider flecainide + dilt. Otherwise, will consider  amiodarone. Dofetilide  admission presently on hold 2/2 COVID hospitalizations.  Continue Xarelto 20 mg daily  2. Secondary Hypercoagulable State (ICD10:  D68.69) The patient is at significant risk for stroke/thromboembolism based upon his CHA2DS2-VASc Score of 6.  Continue Rivaroxaban (Xarelto).   3. HTN Stable, no changes today.  4. 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  5. Chronic diastolic CHF Will check echocardiogram as above. Increase Lasix to 60 mg x 3 days then decrease back to 40 mg daily. May take an extra 1/2 pill PRN.   6. Small cell lung cancer Currently undergoing chemotherapy. Plans per oncology.   Follow up pending echo results.   Addendum 08/14/20. Echo showed EF 60-65%, no LVH. Will plan to start flecainide 50 mg BID, stop amlodipine, and start diltiazem 120 mg daily. Recheck ECG early next week. Patient informed of results and in agreement with plan.   [facility] [address] [place], [place] [address] [phone] 08/12/2020 3:43 PM

## 2020-08-12 NOTE — H&P (View-Only) (Signed)
Primary Care Physician: [name], MD Primary Electrophysiologist: Dr Caryl Comes Referring Physician: Oda Kilts PA   Alexander Murray is a 80 y.o. 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 The patient previously had subclinical AF detected on his device but became persistent and was started on Xarelto for a CHADS2VASC score of 6. He was not able to undergo TEE guided DCCV 2/2 his esophageal strictures. After 3 weeks of anticoagulation he underwent DCCV on 07/29/20. Unfortunately, he is back in rate controlled afib. He does feel like he has more lower extremity edema recently. Otherwise he is unaware of his arrhythmia.   Today, he denies symptoms of palpitations, chest pain, shortness of breath, orthopnea, PND, lower extremity edema, dizziness, presyncope, syncope, snoring, daytime somnolence, bleeding, or neurologic sequela. The patient is tolerating medications without difficulties and is otherwise without complaint today.    Atrial Fibrillation Risk Factors:  he does not have symptoms or diagnosis of sleep apnea. he does not have a history of rheumatic fever.   he has a BMI of Body mass index is 24.39 kg/m.Marland Kitchen Filed Weights   08/12/20 0947  Weight: 77.1 kg    Family History  Problem Relation Age of Onset  . Colon cancer Mother        dx in her 25s  . Heart disease Mother   . Alzheimer's disease Mother   . Colon cancer Father        dx in his early 23s  . Heart disease Father   . Prostate cancer Father   . Prostate cancer Brother        1/2 brother  . Hemochromatosis Brother   . Prostate cancer Brother   . Esophageal cancer Neg Hx   . Pancreatic cancer Neg Hx   . Stomach cancer Neg Hx   . Liver disease Neg Hx      Atrial Fibrillation Management history:  Previous  antiarrhythmic drugs: none Previous cardioversions: 07/29/20 Previous ablations: none CHADS2VASC score: 6 Anticoagulation history: Xarelto   Past Medical History:  Diagnosis Date  . Allergy   . Arthritis    "right knee and right thumb" (12/19/2014)  . Asthma    pt denies  . Bronchitis 07/2016   started in December and continued about 2 months  . Esophageal stricture   . GERD (gastroesophageal reflux disease)   . Hemochromatosis   . Hiatal hernia   . Hypercalcemia   . Hypercholesteremia   . Hypertension   . Internal hemorrhoids   . Lung cancer ([place])   . Osteoarthritis   . Palpitations   . Presence of permanent cardiac pacemaker    hx bradycardia  . Retinal artery occlusion, branch    "right eye"  . Second degree AV block, Mobitz type II   . Skin cancer    right shoulder  . Stroke [facility]) ~ 2011   "right eye"/ partial blindness  . Tubular adenoma of colon    Past Surgical History:  Procedure Laterality Date  . CARDIOVERSION N/A 07/29/2020   Procedure: CARDIOVERSION;  Surgeon: Jerline Pain, MD;  Location: [facility] ENDOSCOPY;  Service: Cardiovascular;  Laterality: N/A;  NEEDS RAPID COVID TEST MORNING OF  . CATARACT EXTRACTION, BILATERAL  01/2017  . COLONOSCOPY    . EP IMPLANTABLE DEVICE N/A 12/19/2014   Procedure: Pacemaker Implant;  Surgeon:  Deboraha Sprang, MD;  Location: [place] CV LAB;  Service: Cardiovascular;  Laterality: N/A;  . ESOPHAGOGASTRODUODENOSCOPY (EGD) WITH ESOPHAGEAL DILATION  X 3  . EYE SURGERY Bilateral 2017  . INGUINAL HERNIA REPAIR Left 1980's  . INSERT / REPLACE / REMOVE PACEMAKER  12/19/2014  . INTERCOSTAL NERVE BLOCK Right 03/11/2020   Procedure: INTERCOSTAL NERVE BLOCK;  Surgeon: Grace Isaac, MD;  Location: [place];  Service: Thoracic;  Laterality: Right;  . JOINT REPLACEMENT Right 2016  . KNEE ARTHROSCOPY Right ~ 1982; ~ 1992  . MOHS SURGERY Right ~ 2014   "pre-melanoma scapula"  . POSTERIOR TIBIAL TENDON REPAIR Left 2012  . TONSILLECTOMY   1946  . TOTAL KNEE ARTHROPLASTY Right 05/20/2015   Procedure: RIGHT TOTAL KNEE ARTHROPLASTY;  Surgeon: Paralee Cancel, MD;  [facility];  Service: Orthopedics;  Laterality: Right;  . UPPER GASTROINTESTINAL ENDOSCOPY    . VIDEO BRONCHOSCOPY N/A 03/11/2020   Procedure: VIDEO BRONCHOSCOPY;  Surgeon: Grace Isaac, MD;  Location: [place];  Service: Thoracic;  Laterality: N/A;  . XI ROBOTIC ASSISTED THORACOSCOPY- SEGMENTECTOMY Right 03/11/2020   Procedure: XI ROBOTIC ASSISTED THORACOSCOPY-RIGHT SUPERIOR SEGMENTECTOMY WITH NODE SAMPLES;  Surgeon: Grace Isaac, MD;  Location: [place];  Service: Thoracic;  Laterality: Right;    Current Outpatient Medications  Medication Sig Dispense Refill  . acetaminophen (TYLENOL) 325 MG tablet Take 2 tablets (650 mg total) by mouth every 6 (six) hours as needed for mild pain or moderate pain.    Marland Kitchen amLODipine (NORVASC) 2.5 MG tablet Take 2.5 mg by mouth at bedtime.    Marland Kitchen aspirin 81 MG tablet Take 81 mg by mouth daily.    . celecoxib (CELEBREX) 200 MG capsule Take 1 capsule (200 mg total) by mouth 2 (two) times daily as needed for mild pain. 10 capsule 0  . doxazosin (CARDURA) 4 MG tablet Take 4 mg by mouth daily.     Marland Kitchen esomeprazole (NEXIUM) 20 MG packet Take 20 mg by mouth daily before breakfast.    . fluticasone (FLONASE) 50 MCG/ACT nasal spray Place 1 spray into both nostrils daily.    . furosemide (LASIX) 40 MG tablet Take 1 tablet (40 mg total) by mouth daily. 90 tablet 3  . Glycerin-Hypromellose-PEG 400 (DRY EYE RELIEF DROPS) 0.2-0.2-1 % SOLN Place 1 drop into both eyes daily as needed (Dry eye). Saline    . HYDROcodone-homatropine (HYCODAN) 5-1.5 MG/5ML syrup Take 5ML twice daily as needed for cough    . Misc Natural Products (GLUCOSAMINE CHONDROITIN ADV PO) Take 1,500 mg by mouth at bedtime.     . montelukast (SINGULAIR) 10 MG tablet Take 10 mg by mouth every morning.     . Multiple Vitamin (MULTIVITAMIN) capsule Take 1 capsule by mouth daily.    .  Multiple Vitamins-Minerals (PRESERVISION AREDS 2 PO) Take 1 capsule by mouth in the morning and at bedtime.     . potassium chloride SA (KLOR-CON) 20 MEQ tablet Take 2 tablets (40 meq total) ONCE, then take 1 tablet (20 meq total) daily along with your lasix. 34 tablet 6  . rivaroxaban (XARELTO) 20 MG TABS tablet Take 1 tablet (20 mg total) by mouth daily with supper. 30 tablet 11  . simvastatin (ZOCOR) 40 MG tablet Take 40 mg by mouth at bedtime.     . vitamin C (ASCORBIC ACID) 500 MG tablet Take 500 mg by mouth at bedtime.      No current facility-administered medications for this encounter.  Allergies  Allergen Reactions  . Lisinopril     Other reaction(s): Dizzy/Palpitations    Social History   Socioeconomic History  . Marital status: Married    Spouse name: Not on file  . Number of children: Not on file  . Years of education: Not on file  . Highest education level: Not on file  Occupational History  . Occupation: retired - Doctor, general practice  Tobacco Use  . Smoking status: Former Smoker    Years: 3.00    Types: Cigarettes    Quit date: 05/16/1962    Years since quitting: 58.2  . Smokeless tobacco: Never Used  . Tobacco comment: "quit smoking in the 1960's"; was an intermittent smoker   Vaping Use  . Vaping Use: Never used  Substance and Sexual Activity  . Alcohol use: No    Comment: 12/19/2014 "stopped drinking in ~ 2012"  . Drug use: No  . Sexual activity: Yes  Other Topics Concern  . Not on file  Social History Narrative  . Not on file   Social Determinants of Health   Financial Resource Strain: Not on file  Food Insecurity: Not on file  Transportation Needs: Not on file  Physical Activity: Not on file  Stress: Not on file  Social Connections: Not on file  Intimate Partner Violence: Not on file     ROS- All systems are reviewed and negative except as per the HPI above.  Physical Exam: Vitals:   08/12/20 0947  BP: 118/82  Pulse: 78  Weight: 77.1  kg  Height: 5\' 10"  (1.778 m)    GEN- The patient is well appearing elderly male, alert and oriented x 3 today.   Head- normocephalic, atraumatic Eyes-  Sclera clear, conjunctiva pink Ears- hearing intact Oropharynx- clear Neck- supple  Lungs- Clear to ausculation bilaterally, normal work of breathing Heart- Regular rate and rhythm, no murmurs, rubs or gallops  GI- soft, NT, ND, + BS Extremities- no clubbing, cyanosis. 1+ bilateral edema MS- no significant deformity or atrophy Skin- no rash or lesion Psych- euthymic mood, full affect Neuro- strength and sensation are intact  Wt Readings from Last 3 Encounters:  08/12/20 77.1 kg  07/29/20 74.2 kg  07/23/20 74.2 kg    EKG today demonstrates  V paced rhythm with underlying afib Vent. rate 78 BPM QRS duration 166 ms QT/QTc 436/497 ms  Echo 12/10/14 demonstrated  Left ventricle: The cavity size was normal. Wall thickness was  normal. Systolic function was normal. The estimated ejection  fraction was in the range of 55% to 60%. Wall motion was normal;  there were no regional wall motion abnormalities.  - Ascending aorta: The ascending aorta was mildly dilated.  - Mitral valve: Calcified annulus.  - Left atrium: The atrium was moderately dilated.  - Pulmonary arteries: PA peak pressure: 40 mm Hg (S).   Impressions:   - Second degree AV block is seen during the study.   Epic records are reviewed at length today  CHA2DS2-VASc Score = 6  The patient's score is based upon: CHF History: Yes HTN History: Yes Diabetes History: No Stroke History: Yes Vascular Disease History: No Age Score: 2 Gender Score: 0      ASSESSMENT AND PLAN: 1. Persistent Atrial Fibrillation (ICD10:  I48.19) The patient's CHA2DS2-VASc score is 6, indicating a 9.7% annual risk of stroke.   We discussed therapeutic options today including AAD.  Will check echocardiogram. If EF normal, can consider flecainide + dilt. Otherwise, will consider  amiodarone. Dofetilide  admission presently on hold 2/2 COVID hospitalizations.  Continue Xarelto 20 mg daily  2. Secondary Hypercoagulable State (ICD10:  D68.69) The patient is at significant risk for stroke/thromboembolism based upon his CHA2DS2-VASc Score of 6.  Continue Rivaroxaban (Xarelto).   3. HTN Stable, no changes today.  4. 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  5. Chronic diastolic CHF Will check echocardiogram as above. Increase Lasix to 60 mg x 3 days then decrease back to 40 mg daily. May take an extra 1/2 pill PRN.   6. Small cell lung cancer Currently undergoing chemotherapy. Plans per oncology.   Follow up pending echo results.   Addendum 08/14/20. Echo showed EF 60-65%, no LVH. Will plan to start flecainide 50 mg BID, stop amlodipine, and start diltiazem 120 mg daily. Recheck ECG early next week. Patient informed of results and in agreement with plan.   [facility] [address] [place], [place] [address] [phone] 08/12/2020 3:43 PM

## 2020-08-14 ENCOUNTER — Ambulatory Visit (HOSPITAL_COMMUNITY)
Admission: RE | Admit: 2020-08-14 | Discharge: 2020-08-14 | Disposition: A | Discharge status: Home / Self Care | Payer: Medicare Other | Source: Ambulatory Visit | Attending: Physician Assistant | Admitting: Physician Assistant

## 2020-08-14 ENCOUNTER — Other Ambulatory Visit: Payer: Self-pay

## 2020-08-14 ENCOUNTER — Other Ambulatory Visit (HOSPITAL_COMMUNITY): Payer: Self-pay | Admitting: *Deleted

## 2020-08-14 DIAGNOSIS — I4891 Unspecified atrial fibrillation: Secondary | ICD-10-CM | POA: Diagnosis not present

## 2020-08-14 DIAGNOSIS — I77819 Aortic ectasia, unspecified site: Secondary | ICD-10-CM | POA: Diagnosis not present

## 2020-08-14 DIAGNOSIS — I517 Cardiomegaly: Secondary | ICD-10-CM | POA: Diagnosis not present

## 2020-08-14 DIAGNOSIS — Z87891 Personal history of nicotine dependence: Secondary | ICD-10-CM | POA: Diagnosis not present

## 2020-08-14 DIAGNOSIS — Z950 Presence of cardiac pacemaker: Secondary | ICD-10-CM | POA: Insufficient documentation

## 2020-08-14 DIAGNOSIS — K219 Gastro-esophageal reflux disease without esophagitis: Secondary | ICD-10-CM | POA: Diagnosis not present

## 2020-08-14 DIAGNOSIS — I4819 Other persistent atrial fibrillation: Secondary | ICD-10-CM

## 2020-08-14 DIAGNOSIS — I10 Essential (primary) hypertension: Secondary | ICD-10-CM | POA: Insufficient documentation

## 2020-08-14 LAB — ECHOCARDIOGRAM COMPLETE
Calc EF: 34.3 %
S' Lateral: 3.2 cm
Single Plane A2C EF: 24.7 %
Single Plane A4C EF: 41.1 %

## 2020-08-14 MED ORDER — FLECAINIDE ACETATE 50 MG PO TABS: 50.0000 mg | ORAL_TABLET | Freq: Two times a day (BID) | ORAL | 3 refills | Status: DC

## 2020-08-14 MED ORDER — DILTIAZEM HCL ER COATED BEADS 120 MG PO CP24
120.0000 mg | ORAL_CAPSULE | Freq: Every day | ORAL | 3 refills | Status: DC
Start: 2020-08-14 — End: 2020-08-23

## 2020-08-14 MED ORDER — ATORVASTATIN CALCIUM 20 MG PO TABS: 20.0000 mg | ORAL_TABLET | Freq: Every day | ORAL | 3 refills | Status: DC

## 2020-08-14 NOTE — Addendum Note (Signed)
Encounter addended by: Oliver Barre, PA on: 08/14/2020 11:12 AM  Actions taken: Clinical Note Signed

## 2020-08-14 NOTE — Progress Notes (Signed)
  Echocardiogram 2D Echocardiogram has been performed.  Michiel Cowboy 08/14/2020, 8:46 AM

## 2020-08-16 ENCOUNTER — Other Ambulatory Visit (HOSPITAL_COMMUNITY): Payer: Medicare Other

## 2020-08-16 ENCOUNTER — Encounter (HOSPITAL_COMMUNITY): Payer: Self-pay

## 2020-08-16 ENCOUNTER — Other Ambulatory Visit (HOSPITAL_COMMUNITY): Payer: Self-pay | Admitting: *Deleted

## 2020-08-16 ENCOUNTER — Other Ambulatory Visit: Payer: Self-pay

## 2020-08-16 ENCOUNTER — Ambulatory Visit (HOSPITAL_COMMUNITY)
Admission: RE | Admit: 2020-08-16 | Discharge: 2020-08-16 | Disposition: A | Discharge status: Home / Self Care | Payer: Medicare Other | Source: Ambulatory Visit | Attending: Physician Assistant | Admitting: Physician Assistant

## 2020-08-16 VITALS — BP 110/68 | HR 71

## 2020-08-16 DIAGNOSIS — I4891 Unspecified atrial fibrillation: Secondary | ICD-10-CM | POA: Insufficient documentation

## 2020-08-16 DIAGNOSIS — I4819 Other persistent atrial fibrillation: Secondary | ICD-10-CM

## 2020-08-16 DIAGNOSIS — D6869 Other thrombophilia: Secondary | ICD-10-CM

## 2020-08-16 LAB — BASIC METABOLIC PANEL
Anion gap: 10 (ref 5–15)
BUN: 23 mg/dL (ref 8–23)
CO2: 26 mmol/L (ref 22–32)
Calcium: 10.4 mg/dL — ABNORMAL HIGH (ref 8.9–10.3)
Chloride: 98 mmol/L (ref 98–111)
Creatinine, Ser: 1.16 mg/dL (ref 0.61–1.24)
GFR, Estimated: >60 mL/min (ref >60)
Glucose, Bld: 153 mg/dL — ABNORMAL HIGH (ref 70–99)
Potassium: 3.5 mmol/L (ref 3.5–5.1)
Sodium: 134 mmol/L — ABNORMAL LOW (ref 135–145)

## 2020-08-16 LAB — CBC
HCT: 35.3 % — ABNORMAL LOW (ref 39.0–52.0)
Hemoglobin: 12.2 g/dL — ABNORMAL LOW (ref 13.0–17.0)
MCH: 31.4 pg (ref 26.0–34.0)
MCHC: 34.6 g/dL (ref 30.0–36.0)
MCV: 90.7 fL (ref 80.0–100.0)
Platelets: 130 10*3/uL — ABNORMAL LOW (ref 150–400)
RBC: 3.89 MIL/uL — ABNORMAL LOW (ref 4.22–5.81)
RDW: 15.2 % (ref 11.5–15.5)
WBC: 5 10*3/uL (ref 4.0–10.5)
nRBC: 0 % (ref 0.0–0.2)

## 2020-08-16 NOTE — Patient Instructions (Signed)
Cardioversion scheduled for Tuesday, January 25th  - Arrive at the [facility] and go to admitting at 930AM  - Do not eat or drink anything after midnight the night prior to your procedure.  - Take all your morning medication (except diabetic medications) with a sip of water prior to arrival.  - You will not be able to drive home after your procedure.  - Do NOT miss any doses of your blood thinner - if you should miss a dose please notify our office immediately.  - If you feel as if you go back into normal rhythm prior to scheduled cardioversion, please notify our office immediately. If your procedure is canceled in the cardioversion suite you will be charged a cancellation fee.

## 2020-08-16 NOTE — Progress Notes (Signed)
Patient returns for ECG after starting flecainide. ECG shows V paced rhythm with underlying coarse afib vs flutter HR 71, QRS 180, QTc 502. Will plan for repeat DCCV. Check bmet/CBC. F/u in AF clinic post DCCV.

## 2020-08-17 ENCOUNTER — Other Ambulatory Visit (HOSPITAL_COMMUNITY)
Admission: RE | Admit: 2020-08-17 | Discharge: 2020-08-17 | Disposition: A | Discharge status: Home / Self Care | Payer: Medicare Other | Source: Ambulatory Visit | Attending: Cardiovascular Disease | Admitting: Cardiovascular Disease

## 2020-08-17 DIAGNOSIS — Z20822 Contact with and (suspected) exposure to covid-19: Secondary | ICD-10-CM | POA: Diagnosis not present

## 2020-08-17 DIAGNOSIS — Z01812 Encounter for preprocedural laboratory examination: Secondary | ICD-10-CM | POA: Insufficient documentation

## 2020-08-17 LAB — SARS CORONAVIRUS 2 (TAT 6-24 HRS): SARS Coronavirus 2: NEGATIVE

## 2020-08-19 ENCOUNTER — Ambulatory Visit (HOSPITAL_COMMUNITY)
Admission: RE | Admit: 2020-08-19 | Discharge: 2020-08-19 | Disposition: A | Discharge status: Home / Self Care | Payer: Medicare Other | Attending: Cardiovascular Disease | Admitting: Cardiovascular Disease

## 2020-08-19 ENCOUNTER — Ambulatory Visit (HOSPITAL_COMMUNITY): Payer: Medicare Other | Admitting: Anesthesiology

## 2020-08-19 ENCOUNTER — Other Ambulatory Visit: Payer: Self-pay

## 2020-08-19 ENCOUNTER — Encounter (HOSPITAL_COMMUNITY)
Admission: RE | Disposition: A | Discharge status: Home / Self Care | Payer: Self-pay | Source: Home / Self Care | Attending: Cardiovascular Disease

## 2020-08-19 ENCOUNTER — Encounter (HOSPITAL_COMMUNITY): Payer: Medicare Other | Admitting: Physician Assistant

## 2020-08-19 DIAGNOSIS — C3490 Malignant neoplasm of unspecified part of unspecified bronchus or lung: Secondary | ICD-10-CM | POA: Diagnosis not present

## 2020-08-19 DIAGNOSIS — Z950 Presence of cardiac pacemaker: Secondary | ICD-10-CM | POA: Diagnosis not present

## 2020-08-19 DIAGNOSIS — I4819 Other persistent atrial fibrillation: Secondary | ICD-10-CM | POA: Insufficient documentation

## 2020-08-19 DIAGNOSIS — K219 Gastro-esophageal reflux disease without esophagitis: Secondary | ICD-10-CM | POA: Diagnosis not present

## 2020-08-19 DIAGNOSIS — I441 Atrioventricular block, second degree: Secondary | ICD-10-CM | POA: Diagnosis not present

## 2020-08-19 DIAGNOSIS — Z7982 Long term (current) use of aspirin: Secondary | ICD-10-CM | POA: Diagnosis not present

## 2020-08-19 DIAGNOSIS — Z87891 Personal history of nicotine dependence: Secondary | ICD-10-CM | POA: Diagnosis not present

## 2020-08-19 DIAGNOSIS — Z79899 Other long term (current) drug therapy: Secondary | ICD-10-CM | POA: Diagnosis not present

## 2020-08-19 DIAGNOSIS — E7800 Pure hypercholesterolemia, unspecified: Secondary | ICD-10-CM | POA: Diagnosis not present

## 2020-08-19 DIAGNOSIS — I5032 Chronic diastolic (congestive) heart failure: Secondary | ICD-10-CM | POA: Insufficient documentation

## 2020-08-19 DIAGNOSIS — I10 Essential (primary) hypertension: Secondary | ICD-10-CM | POA: Diagnosis not present

## 2020-08-19 DIAGNOSIS — D6869 Other thrombophilia: Secondary | ICD-10-CM | POA: Diagnosis not present

## 2020-08-19 DIAGNOSIS — I110 Hypertensive heart disease with heart failure: Secondary | ICD-10-CM | POA: Insufficient documentation

## 2020-08-19 HISTORY — PX: CARDIOVERSION: SHX1299

## 2020-08-19 SURGERY — CARDIOVERSION
Anesthesia: General

## 2020-08-19 MED ORDER — PROPOFOL 10 MG/ML IV BOLUS
INTRAVENOUS | Status: DC | PRN
  Administered 2020-08-19: 60 mg via INTRAVENOUS

## 2020-08-19 MED ORDER — LACTATED RINGERS IV SOLN: INTRAVENOUS | Status: DC | PRN

## 2020-08-19 MED ORDER — LIDOCAINE 2% (20 MG/ML) 5 ML SYRINGE
INTRAMUSCULAR | Status: DC | PRN
  Administered 2020-08-19: 100 mL via INTRAVENOUS

## 2020-08-19 MED ORDER — SODIUM CHLORIDE 0.9 % IV SOLN
INTRAVENOUS | Status: AC | PRN
  Administered 2020-08-19: 500 mL via INTRAMUSCULAR

## 2020-08-19 NOTE — Anesthesia Preprocedure Evaluation (Signed)
Anesthesia Evaluation  Patient identified by MRN, date of birth, ID band Patient awake    Reviewed: Allergy & Precautions, NPO status , Patient's Chart, lab work & pertinent test results  Airway Mallampati: II  TM Distance: >3 FB     Dental  (+) Dental Advisory Given   Pulmonary asthma , former smoker,    breath sounds clear to auscultation       Cardiovascular hypertension, Pt. on medications + dysrhythmias Atrial Fibrillation + pacemaker  Rhythm:Regular Rate:Normal  Normal EF. Valves WNL. Dilated aortic root stable at 4.4cm   Neuro/Psych CVA    GI/Hepatic Neg liver ROS, hiatal hernia, GERD  ,  Endo/Other  negative endocrine ROS  Renal/GU negative Renal ROS     Musculoskeletal  (+) Arthritis ,   Abdominal   Peds  Hematology  (+) anemia ,   Anesthesia Other Findings   Reproductive/Obstetrics                             Anesthesia Physical Anesthesia Plan  ASA: III  Anesthesia Plan: General   Post-op Pain Management:    Induction: Intravenous  PONV Risk Score and Plan: 2 and Treatment may vary due to age or medical condition  Airway Management Planned: Mask and Natural Airway  Additional Equipment:   Intra-op Plan:   Post-operative Plan:   Informed Consent: I have reviewed the patients History and Physical, chart, labs and discussed the procedure including the risks, benefits and alternatives for the proposed anesthesia with the patient or authorized representative who has indicated his/her understanding and acceptance.       Plan Discussed with:   Anesthesia Plan Comments:         Anesthesia Quick Evaluation

## 2020-08-19 NOTE — CV Procedure (Signed)
    Cardioversion Note  Alexander Murray [id number] 12-01-1940  Procedure: DC Cardioversion Indications: atrial fib   Procedure Details Consent: Obtained Time Out: Verified patient identification, verified procedure, site/side was marked, verified correct patient position, special equipment/implants available, Radiology Safety Procedures followed,  medications/allergies/relevent history reviewed, required imaging and test results available.  Performed  The patient has been on adequate anticoagulation.  The patient received IV Lidocaine 100 mg IV followed by Propofol 60 mg  for sedation.  Synchronous cardioversion was performed at 200  joules.  The cardioversion was successful.     Complications: No apparent complications Patient did tolerate procedure well.   Alexander Murray, Brooke Bonito., MD, [facility] 08/19/2020, 9:04 AM

## 2020-08-19 NOTE — Interval H&P Note (Signed)
History and Physical Interval Note:  08/19/2020 8:54 AM  Alexander Murray  has presented today for surgery, with the diagnosis of AFIB.  The various methods of treatment have been discussed with the patient and family. After consideration of risks, benefits and other options for treatment, the patient has consented to  Procedure(s): CARDIOVERSION (N/A) as a surgical intervention.  The patient's history has been reviewed, patient examined, no change in status, stable for surgery.  I have reviewed the patient's chart and labs.  Questions were answered to the patient's satisfaction.     Mertie Moores

## 2020-08-19 NOTE — Anesthesia Postprocedure Evaluation (Signed)
Anesthesia Post Note  Patient: Alexander Murray  Procedure(s) Performed: CARDIOVERSION (N/A )     Patient location during evaluation: Endoscopy Anesthesia Type: General Level of consciousness: sedated Pain management: pain level controlled Vital Signs Assessment: post-procedure vital signs reviewed and stable Respiratory status: spontaneous breathing and respiratory function stable Cardiovascular status: stable Postop Assessment: no apparent nausea or vomiting Anesthetic complications: no   No complications documented.  Last Vitals:  Vitals:   08/19/20 0915 08/19/20 0935  BP: 117/81 (!) 142/93  Pulse: 64 65  Resp: 19 16  Temp: 36.4 C   SpO2: 97% 100%    Last Pain:  Vitals:   08/19/20 0915  TempSrc: Axillary  PainSc: Asleep                 Kelan Pritt DANIEL

## 2020-08-19 NOTE — Transfer of Care (Signed)
Immediate Anesthesia Transfer of Care Note  Patient: Alexander Murray  Procedure(s) Performed: CARDIOVERSION (N/A )  Patient Location: Endoscopy Unit  Anesthesia Type:General  Level of Consciousness: drowsy and patient cooperative  Airway & Oxygen Therapy: Patient Spontanous Breathing  Post-op Assessment: Report given to RN and Post -op Vital signs reviewed and stable  Post vital signs: Reviewed and stable  Last Vitals:  Vitals Value Taken Time  BP 129/90 08/19/20 0906  Temp    Pulse 62 08/19/20 0909  Resp 18 08/19/20 0909  SpO2 98 % 08/19/20 0909  Vitals shown include unvalidated device data.  Last Pain:  Vitals:   08/19/20 0833  TempSrc: Oral  PainSc: 0-No pain         Complications: No complications documented.

## 2020-08-20 ENCOUNTER — Encounter (HOSPITAL_COMMUNITY): Payer: Self-pay | Admitting: Cardiovascular Disease

## 2020-08-21 ENCOUNTER — Telehealth (HOSPITAL_COMMUNITY): Payer: Self-pay | Admitting: *Deleted

## 2020-08-21 NOTE — Telephone Encounter (Signed)
Patient called in stating he is up 4lbs since cardioversion and feels "puny". Pt had already taken an extra 20mg  of lasix this morning prior to calling. Abdominal bloating no shortness of breath.  Pt unsure if he is in rhythm or not - will send transmission to confirm rhythm.

## 2020-08-21 NOTE — Telephone Encounter (Signed)
Follow-up transmission received.  Presenting Rhythm APVP 70s.  Device does not track fluid status.

## 2020-08-21 NOTE — Telephone Encounter (Signed)
Patient notified he remains in NSR. If weight is still up tomorrow he will continue extra 20mg  of lasix for 3 days. Pt feeling better knowing he is still in normal rhythm.

## 2020-08-23 ENCOUNTER — Encounter (HOSPITAL_COMMUNITY): Payer: Self-pay | Admitting: Physician Assistant

## 2020-08-23 ENCOUNTER — Other Ambulatory Visit: Payer: Self-pay

## 2020-08-23 ENCOUNTER — Telehealth: Payer: Self-pay | Admitting: Pharmacist

## 2020-08-23 ENCOUNTER — Ambulatory Visit (HOSPITAL_BASED_OUTPATIENT_CLINIC_OR_DEPARTMENT_OTHER)
Admission: RE | Admit: 2020-08-23 | Discharge: 2020-08-23 | Disposition: A | Discharge status: Home / Self Care | Payer: Medicare Other | Source: Ambulatory Visit | Attending: Physician Assistant | Admitting: Physician Assistant

## 2020-08-23 ENCOUNTER — Other Ambulatory Visit (HOSPITAL_COMMUNITY): Payer: Self-pay | Admitting: *Deleted

## 2020-08-23 VITALS — BP 102/56 | HR 69 | Ht 70.0 in | Wt 169.2 lb

## 2020-08-23 DIAGNOSIS — I110 Hypertensive heart disease with heart failure: Secondary | ICD-10-CM | POA: Insufficient documentation

## 2020-08-23 DIAGNOSIS — I4819 Other persistent atrial fibrillation: Secondary | ICD-10-CM

## 2020-08-23 DIAGNOSIS — Z8249 Family history of ischemic heart disease and other diseases of the circulatory system: Secondary | ICD-10-CM | POA: Insufficient documentation

## 2020-08-23 DIAGNOSIS — D6869 Other thrombophilia: Secondary | ICD-10-CM

## 2020-08-23 DIAGNOSIS — Z85118 Personal history of other malignant neoplasm of bronchus and lung: Secondary | ICD-10-CM | POA: Diagnosis not present

## 2020-08-23 DIAGNOSIS — N179 Acute kidney failure, unspecified: Secondary | ICD-10-CM | POA: Diagnosis not present

## 2020-08-23 DIAGNOSIS — C3490 Malignant neoplasm of unspecified part of unspecified bronchus or lung: Secondary | ICD-10-CM | POA: Insufficient documentation

## 2020-08-23 DIAGNOSIS — Z87891 Personal history of nicotine dependence: Secondary | ICD-10-CM | POA: Insufficient documentation

## 2020-08-23 DIAGNOSIS — J90 Pleural effusion, not elsewhere classified: Secondary | ICD-10-CM | POA: Diagnosis not present

## 2020-08-23 DIAGNOSIS — Z79899 Other long term (current) drug therapy: Secondary | ICD-10-CM | POA: Insufficient documentation

## 2020-08-23 DIAGNOSIS — K219 Gastro-esophageal reflux disease without esophagitis: Secondary | ICD-10-CM | POA: Diagnosis not present

## 2020-08-23 DIAGNOSIS — Z7901 Long term (current) use of anticoagulants: Secondary | ICD-10-CM | POA: Insufficient documentation

## 2020-08-23 DIAGNOSIS — Z888 Allergy status to other drugs, medicaments and biological substances status: Secondary | ICD-10-CM | POA: Insufficient documentation

## 2020-08-23 DIAGNOSIS — I441 Atrioventricular block, second degree: Secondary | ICD-10-CM | POA: Diagnosis not present

## 2020-08-23 DIAGNOSIS — I5033 Acute on chronic diastolic (congestive) heart failure: Secondary | ICD-10-CM | POA: Diagnosis not present

## 2020-08-23 DIAGNOSIS — E785 Hyperlipidemia, unspecified: Secondary | ICD-10-CM | POA: Diagnosis not present

## 2020-08-23 DIAGNOSIS — Z8673 Personal history of transient ischemic attack (TIA), and cerebral infarction without residual deficits: Secondary | ICD-10-CM | POA: Insufficient documentation

## 2020-08-23 DIAGNOSIS — Z20822 Contact with and (suspected) exposure to covid-19: Secondary | ICD-10-CM | POA: Diagnosis not present

## 2020-08-23 DIAGNOSIS — Z950 Presence of cardiac pacemaker: Secondary | ICD-10-CM | POA: Insufficient documentation

## 2020-08-23 DIAGNOSIS — I509 Heart failure, unspecified: Secondary | ICD-10-CM | POA: Diagnosis not present

## 2020-08-23 DIAGNOSIS — Z85828 Personal history of other malignant neoplasm of skin: Secondary | ICD-10-CM | POA: Diagnosis not present

## 2020-08-23 DIAGNOSIS — R0602 Shortness of breath: Secondary | ICD-10-CM | POA: Diagnosis not present

## 2020-08-23 DIAGNOSIS — R600 Localized edema: Secondary | ICD-10-CM | POA: Insufficient documentation

## 2020-08-23 LAB — COMPREHENSIVE METABOLIC PANEL
ALT: 29 U/L (ref 0–44)
AST: 39 U/L (ref 15–41)
Albumin: 3.6 g/dL (ref 3.5–5.0)
Alkaline Phosphatase: 214 U/L — ABNORMAL HIGH (ref 38–126)
Anion gap: 12 (ref 5–15)
BUN: 24 mg/dL — ABNORMAL HIGH (ref 8–23)
CO2: 26 mmol/L (ref 22–32)
Calcium: 10.1 mg/dL (ref 8.9–10.3)
Chloride: 97 mmol/L — ABNORMAL LOW (ref 98–111)
Creatinine, Ser: 1.14 mg/dL (ref 0.61–1.24)
GFR, Estimated: >60 mL/min (ref >60)
Glucose, Bld: 172 mg/dL — ABNORMAL HIGH (ref 70–99)
Potassium: 3.5 mmol/L (ref 3.5–5.1)
Sodium: 135 mmol/L (ref 135–145)
Total Bilirubin: 1.9 mg/dL — ABNORMAL HIGH (ref 0.3–1.2)
Total Protein: 6.2 g/dL — ABNORMAL LOW (ref 6.5–8.1)

## 2020-08-23 LAB — TSH: TSH: 5.63 u[IU]/mL — ABNORMAL HIGH (ref 0.350–4.500)

## 2020-08-23 LAB — MAGNESIUM: Magnesium: 2 mg/dL (ref 1.7–2.4)

## 2020-08-23 LAB — T4, FREE: Free T4: 1.11 ng/dL (ref 0.61–1.12)

## 2020-08-23 MED ORDER — TORSEMIDE 20 MG PO TABS: 40.0000 mg | ORAL_TABLET | Freq: Every day | ORAL | 2 refills | Status: DC

## 2020-08-23 MED ORDER — METOPROLOL SUCCINATE ER 25 MG PO TB24: 25.0000 mg | ORAL_TABLET | Freq: Every day | ORAL | 3 refills | Status: DC

## 2020-08-23 MED ORDER — POTASSIUM CHLORIDE CRYS ER 20 MEQ PO TBCR: 60.0000 meq | EXTENDED_RELEASE_TABLET | Freq: Every day | ORAL | 6 refills | Status: DC

## 2020-08-23 MED ORDER — POTASSIUM CHLORIDE CRYS ER 20 MEQ PO TBCR: 40.0000 meq | EXTENDED_RELEASE_TABLET | Freq: Every day | ORAL | 6 refills | Status: DC

## 2020-08-23 NOTE — Addendum Note (Signed)
Encounter addended by: Juluis Mire, RN on: 08/23/2020 3:33 PM  Actions taken: Order list changed, Diagnosis association updated

## 2020-08-23 NOTE — Progress Notes (Addendum)
Primary Care Physician: [name], MD Primary Electrophysiologist: Dr Caryl Comes Referring Physician: Oda Kilts PA   Alexander Murray is a 80 y.o. 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 The patient previously had subclinical AF detected on his device but became persistent and was started on Xarelto for a CHADS2VASC score of 6. He was not able to undergo TEE guided DCCV 2/2 his esophageal strictures. After 3 weeks of anticoagulation he underwent DCCV on 07/29/20. Echo showed preserved EF and he was started on flecainide.  On follow up today, patient is s/p DCCV on 08/19/20. Patient reports that he has had abdominal and scrotal swelling and SOB since the procedure. His weight as stayed the same despite increased diuretics. Unfortunately, he is also back in afib today.   Today, he denies symptoms of palpitations, chest pain, orthopnea, PND, dizziness, presyncope, syncope, snoring, daytime somnolence, bleeding, or neurologic sequela. The patient is tolerating medications without difficulties and is otherwise without complaint today.    Atrial Fibrillation Risk Factors:  he does not have symptoms or diagnosis of sleep apnea. he does not have a history of rheumatic fever.   he has a BMI of Body mass index is 24.28 kg/m.Marland Kitchen Filed Weights   08/23/20 1412  Weight: 76.7 kg    Family History  Problem Relation Age of Onset  . Colon cancer Mother        dx in her 68s  . Heart disease Mother   . Alzheimer's disease Mother   . Colon cancer Father        dx in his early 27s  . Heart disease Father   . Prostate cancer Father   . Prostate cancer Brother        1/2 brother  . Hemochromatosis Brother   . Prostate cancer Brother   . Esophageal cancer Neg Hx   . Pancreatic cancer Neg Hx   .  Stomach cancer Neg Hx   . Liver disease Neg Hx      Atrial Fibrillation Management history:  Previous antiarrhythmic drugs: flecainide  Previous cardioversions: 07/29/20, 08/19/20 Previous ablations: none CHADS2VASC score: 6 Anticoagulation history: Xarelto   Past Medical History:  Diagnosis Date  . Allergy   . Arthritis    "right knee and right thumb" (12/19/2014)  . Asthma    pt denies  . Bronchitis 07/2016   started in December and continued about 2 months  . Esophageal stricture   . GERD (gastroesophageal reflux disease)   . Hemochromatosis   . Hiatal hernia   . Hypercalcemia   . Hypercholesteremia   . Hypertension   . Internal hemorrhoids   . Lung cancer ([place])   . Osteoarthritis   . Palpitations   . Presence of permanent cardiac pacemaker    hx bradycardia  . Retinal artery occlusion, branch    "right eye"  . Second degree AV block, Mobitz type II   . Skin cancer    right shoulder  . Stroke [facility]) ~ 2011   "right eye"/ partial blindness  . Tubular adenoma of colon    Past Surgical History:  Procedure Laterality Date  . CARDIOVERSION N/A 07/29/2020   Procedure: CARDIOVERSION;  Surgeon: Jerline Pain, MD;  Location: [facility] ENDOSCOPY;  Service: Cardiovascular;  Laterality: N/A;  NEEDS RAPID COVID TEST MORNING OF  . CARDIOVERSION  N/A 08/19/2020   Procedure: CARDIOVERSION;  Surgeon: Acie Fredrickson Wonda Cheng, MD;  Location: [place];  Service: Cardiovascular;  Laterality: N/A;  . CATARACT EXTRACTION, BILATERAL  01/2017  . COLONOSCOPY    . EP IMPLANTABLE DEVICE N/A 12/19/2014   Procedure: Pacemaker Implant;  Surgeon: Deboraha Sprang, MD;  Location: [place] CV LAB;  Service: Cardiovascular;  Laterality: N/A;  . ESOPHAGOGASTRODUODENOSCOPY (EGD) WITH ESOPHAGEAL DILATION  X 3  . EYE SURGERY Bilateral 2017  . INGUINAL HERNIA REPAIR Left 1980's  . INSERT / REPLACE / REMOVE PACEMAKER  12/19/2014  . INTERCOSTAL NERVE BLOCK Right 03/11/2020   Procedure: INTERCOSTAL NERVE BLOCK;   Surgeon: Grace Isaac, MD;  Location: [place];  Service: Thoracic;  Laterality: Right;  . JOINT REPLACEMENT Right 2016  . KNEE ARTHROSCOPY Right ~ 1982; ~ 1992  . MOHS SURGERY Right ~ 2014   "pre-melanoma scapula"  . POSTERIOR TIBIAL TENDON REPAIR Left 2012  . TONSILLECTOMY  1946  . TOTAL KNEE ARTHROPLASTY Right 05/20/2015   Procedure: RIGHT TOTAL KNEE ARTHROPLASTY;  Surgeon: Paralee Cancel, MD;  Location: [facility];  Service: Orthopedics;  Laterality: Right;  . UPPER GASTROINTESTINAL ENDOSCOPY    . VIDEO BRONCHOSCOPY N/A 03/11/2020   Procedure: VIDEO BRONCHOSCOPY;  Surgeon: Grace Isaac, MD;  Location: [place];  Service: Thoracic;  Laterality: N/A;  . XI ROBOTIC ASSISTED THORACOSCOPY- SEGMENTECTOMY Right 03/11/2020   Procedure: XI ROBOTIC ASSISTED THORACOSCOPY-RIGHT SUPERIOR SEGMENTECTOMY WITH NODE SAMPLES;  Surgeon: Grace Isaac, MD;  Location: [place];  Service: Thoracic;  Laterality: Right;    Current Outpatient Medications  Medication Sig Dispense Refill  . acetaminophen (TYLENOL) 325 MG tablet Take 2 tablets (650 mg total) by mouth every 6 (six) hours as needed for mild pain or moderate pain.    Marland Kitchen atorvastatin (LIPITOR) 20 MG tablet Take 1 tablet (20 mg total) by mouth daily. 30 tablet 3  . celecoxib (CELEBREX) 200 MG capsule Take 1 capsule (200 mg total) by mouth 2 (two) times daily as needed for mild pain. 10 capsule 0  . diazepam (VALIUM) 5 MG tablet Take 5 mg by mouth at bedtime as needed (sleep).    Marland Kitchen doxazosin (CARDURA) 4 MG tablet Take 4 mg by mouth daily.     Marland Kitchen esomeprazole (NEXIUM) 20 MG packet Take 20 mg by mouth daily before breakfast.    . flecainide (TAMBOCOR) 50 MG tablet Take 1 tablet (50 mg total) by mouth 2 (two) times daily. 60 tablet 3  . fluticasone (FLONASE) 50 MCG/ACT nasal spray Place 1 spray into both nostrils daily.    . Glycerin-Hypromellose-PEG 400 (DRY EYE RELIEF DROPS) 0.2-0.2-1 % SOLN Place 1 drop into both eyes daily as needed (Dry eye). Saline    .  HYDROcodone-homatropine (HYCODAN) 5-1.5 MG/5ML syrup Take 5 mLs by mouth every 6 (six) hours as needed for cough.    . Misc Natural Products (GLUCOSAMINE CHONDROITIN ADV PO) Take 1,500 mg by mouth at bedtime.     . montelukast (SINGULAIR) 10 MG tablet Take 10 mg by mouth every morning.     . Multiple Vitamin (MULTIVITAMIN) capsule Take 1 capsule by mouth daily.    . Multiple Vitamins-Minerals (PRESERVISION AREDS 2 PO) Take 1 capsule by mouth in the morning and at bedtime.     . potassium chloride SA (KLOR-CON) 20 MEQ tablet Take 2 tablets (40 meq total) ONCE, then take 1 tablet (20 meq total) daily along with your lasix. 34 tablet 6  . rivaroxaban (XARELTO) 20 MG  TABS tablet Take 1 tablet (20 mg total) by mouth daily with supper. 30 tablet 11  . vitamin C (ASCORBIC ACID) 500 MG tablet Take 500 mg by mouth at bedtime.      No current facility-administered medications for this encounter.    Allergies  Allergen Reactions  . Lisinopril     Other reaction(s): Dizzy/Palpitations    Social History   Socioeconomic History  . Marital status: Married    Spouse name: Not on file  . Number of children: Not on file  . Years of education: Not on file  . Highest education level: Not on file  Occupational History  . Occupation: retired - Doctor, general practice  Tobacco Use  . Smoking status: Former Smoker    Years: 3.00    Types: Cigarettes    Quit date: 05/16/1962    Years since quitting: 58.3  . Smokeless tobacco: Never Used  . Tobacco comment: "quit smoking in the 1960's"; was an intermittent smoker   Vaping Use  . Vaping Use: Never used  Substance and Sexual Activity  . Alcohol use: No    Comment: 12/19/2014 "stopped drinking in ~ 2012"  . Drug use: No  . Sexual activity: Yes  Other Topics Concern  . Not on file  Social History Narrative  . Not on file   Social Determinants of Health   Financial Resource Strain: Not on file  Food Insecurity: Not on file  Transportation Needs: Not  on file  Physical Activity: Not on file  Stress: Not on file  Social Connections: Not on file  Intimate Partner Violence: Not on file     ROS- All systems are reviewed and negative except as per the HPI above.  Physical Exam: Vitals:   08/23/20 1412  BP: (!) 102/56  Pulse: 69  Weight: 76.7 kg  Height: 5\' 10"  (1.778 m)    GEN- The patient is well appearing elderly male, alert and oriented x 3 today.   HEENT-head normocephalic, atraumatic, sclera clear, conjunctiva pink, hearing intact, trachea midline. Lungs- Clear to ausculation bilaterally, normal work of breathing Heart- Regular rate and rhythm, no murmurs, rubs or gallops  GI- soft, NT, ND, + BS, no ascites  Extremities- no clubbing, cyanosis. 1+ bilateral lower extremity edema. Scrotal edema.  MS- no significant deformity or atrophy Skin- no rash or lesion Psych- euthymic mood, full affect Neuro- strength and sensation are intact   Wt Readings from Last 3 Encounters:  08/23/20 76.7 kg  08/19/20 77.1 kg  08/12/20 77.1 kg    EKG today demonstrates  V paced rhythm with underlying afib, PVC Vent. rate 69 BPM QRS duration 184 ms QT/QTc 500/535 ms  Echo 08/14/20 demonstrated  1. Left ventricular ejection fraction, by estimation, is 60 to 65%. The  left ventricle has normal function. Left ventricular endocardial border  not optimally defined to evaluate regional wall motion. Left ventricular  diastolic parameters are  indeterminate.  2. Right ventricular systolic function is mildly reduced. The right  ventricular size is mildly enlarged. There is normal pulmonary artery  systolic pressure. The estimated right ventricular systolic pressure is  34.2 mmHg.  3. Left atrial size was moderately dilated.  4. Right atrial size was mild to moderately dilated.  5. The mitral valve is degenerative. Trivial mitral valve regurgitation.  No evidence of mitral stenosis. Moderate mitral annular calcification.  6. The aortic  valve is abnormal. There is moderate calcification of the  aortic valve with fixed left coronary cusp and relatively preserved  motion  of the non-coronary cusp and right coronary cusp. Aortic valve  regurgitation is not visualized. Moderate  aortic valve sclerosis/calcification is present, without any evidence of  aortic stenosis, however Doppler interrogation only performed in one  location.  7. Aortic dilatation noted. There is mild to moderate dilatation of the  ascending aorta, measuring 44 mm.  8. The inferior vena cava is dilated in size with <50% respiratory  variability, suggesting right atrial pressure of 15 mmHg.   Epic records are reviewed at length today  CHA2DS2-VASc Score = 6  The patient's score is based upon: CHF History: Yes HTN History: Yes Diabetes History: No Stroke History: Yes Vascular Disease History: No Age Score: 2 Gender Score: 0      ASSESSMENT AND PLAN: 1. Persistent Atrial Fibrillation (ICD10:  I48.19) The patient's CHA2DS2-VASc score is 6, indicating a 9.7% annual risk of stroke.   S/p DCCV 08/19/20 He is back in afib today. D/w Dr Caryl Comes. Will plan to stop flecainide and admit for dofetilide next week. I do feel that he is at risk of further decompensation if dofetilide admission is delayed.  QT in SR 420-430 when corrected for V pacing. Patient has checked on price of medication and will use [facility]. Will send medications to pharmacy for review.  Will stop diltiazem and start Toprol 25 mg daily. Stop ASA Check Cmet/TSH/mag today. Continue Xarelto 20 mg daily (Eliquis not covered by patient insurance)  2. Secondary Hypercoagulable State (ICD10:  D68.69) The patient is at significant risk for stroke/thromboembolism based upon his CHA2DS2-VASc Score of 6.  Continue Rivaroxaban (Xarelto).   3. HTN Stable, med changes as above.  4. 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  5. Acute on chronic diastolic  CHF Patient's weight remains up with symptoms of edema in lower extremities, abdomen, and scrotum.  Change Lasix to torsemide 40 mg daily Patient has only been taking KCL 20 meq daily. Increase to 60 meq daily. Check Cmet as above.  6. Small cell lung cancer Currently undergoing chemotherapy.  Plans per oncology.    Follow up in AF clinic next week.    [facility] [address] [place], [place] [address] [phone] 08/23/2020 2:48 PM

## 2020-08-23 NOTE — Telephone Encounter (Signed)
Medication list reviewed in anticipation of upcoming Tikosyn initiation. Flecainide was discontinued today, no other contraindicated medications.  Patient is anticoagulated on Xarelto 20mg  daily on the appropriate dose. Please ensure that patient has not missed any anticoagulation doses in the 3 weeks prior to Tikosyn initiation.   K low at 3.5 today, pt started on Klor-Con 75meq daily today, will need to be > 4 for pt to start Tikosyn.  Patient will need to be counseled to avoid use of Benadryl while on Tikosyn and in the 2-3 days prior to Tikosyn initiation.

## 2020-08-23 NOTE — Patient Instructions (Addendum)
STOP diltiazem (cardizem) STOP aspirin STOP lasix (furosemide) STOP flecainide  Increase potassium to 31meq once a day  Start Metoprolol 25mg  once a day at bedtime  Start Torsemide 40mg  once a day

## 2020-08-23 NOTE — Addendum Note (Signed)
Encounter addended by: Juluis Mire, RN on: 08/23/2020 3:54 PM  Actions taken: Order list changed

## 2020-08-24 ENCOUNTER — Telehealth: Payer: Self-pay | Admitting: Student

## 2020-08-24 ENCOUNTER — Other Ambulatory Visit (HOSPITAL_COMMUNITY): Payer: Medicare Other

## 2020-08-24 ENCOUNTER — Encounter (HOSPITAL_COMMUNITY): Payer: Self-pay

## 2020-08-24 ENCOUNTER — Emergency Department (HOSPITAL_COMMUNITY): Payer: Medicare Other

## 2020-08-24 ENCOUNTER — Other Ambulatory Visit: Payer: Self-pay

## 2020-08-24 ENCOUNTER — Inpatient Hospital Stay (HOSPITAL_COMMUNITY)
Admission: EM | Admit: 2020-08-24 | Discharge: 2020-08-29 | DRG: 291 | Disposition: A | Discharge status: Home / Self Care | Payer: Medicare Other | Attending: Internal Medicine | Admitting: Internal Medicine

## 2020-08-24 ENCOUNTER — Other Ambulatory Visit (HOSPITAL_COMMUNITY)
Admission: RE | Admit: 2020-08-24 | Discharge: 2020-08-24 | Disposition: A | Discharge status: Home / Self Care | Payer: Medicare Other | Source: Ambulatory Visit | Attending: Internal Medicine | Admitting: Internal Medicine

## 2020-08-24 DIAGNOSIS — Z96651 Presence of right artificial knee joint: Secondary | ICD-10-CM | POA: Diagnosis present

## 2020-08-24 DIAGNOSIS — Z87891 Personal history of nicotine dependence: Secondary | ICD-10-CM | POA: Diagnosis not present

## 2020-08-24 DIAGNOSIS — Z902 Acquired absence of lung [part of]: Secondary | ICD-10-CM | POA: Diagnosis not present

## 2020-08-24 DIAGNOSIS — E785 Hyperlipidemia, unspecified: Secondary | ICD-10-CM | POA: Diagnosis not present

## 2020-08-24 DIAGNOSIS — Z8249 Family history of ischemic heart disease and other diseases of the circulatory system: Secondary | ICD-10-CM

## 2020-08-24 DIAGNOSIS — K219 Gastro-esophageal reflux disease without esophagitis: Secondary | ICD-10-CM | POA: Diagnosis not present

## 2020-08-24 DIAGNOSIS — I5033 Acute on chronic diastolic (congestive) heart failure: Secondary | ICD-10-CM

## 2020-08-24 DIAGNOSIS — I4819 Other persistent atrial fibrillation: Secondary | ICD-10-CM | POA: Diagnosis not present

## 2020-08-24 DIAGNOSIS — Z85118 Personal history of other malignant neoplasm of bronchus and lung: Secondary | ICD-10-CM | POA: Diagnosis not present

## 2020-08-24 DIAGNOSIS — Z8042 Family history of malignant neoplasm of prostate: Secondary | ICD-10-CM | POA: Diagnosis not present

## 2020-08-24 DIAGNOSIS — Z8673 Personal history of transient ischemic attack (TIA), and cerebral infarction without residual deficits: Secondary | ICD-10-CM

## 2020-08-24 DIAGNOSIS — Z01812 Encounter for preprocedural laboratory examination: Secondary | ICD-10-CM | POA: Insufficient documentation

## 2020-08-24 DIAGNOSIS — N179 Acute kidney failure, unspecified: Secondary | ICD-10-CM | POA: Diagnosis present

## 2020-08-24 DIAGNOSIS — I509 Heart failure, unspecified: Secondary | ICD-10-CM

## 2020-08-24 DIAGNOSIS — G4700 Insomnia, unspecified: Secondary | ICD-10-CM | POA: Diagnosis present

## 2020-08-24 DIAGNOSIS — I10 Essential (primary) hypertension: Secondary | ICD-10-CM | POA: Diagnosis not present

## 2020-08-24 DIAGNOSIS — Z85828 Personal history of other malignant neoplasm of skin: Secondary | ICD-10-CM | POA: Diagnosis not present

## 2020-08-24 DIAGNOSIS — I110 Hypertensive heart disease with heart failure: Principal | ICD-10-CM | POA: Diagnosis present

## 2020-08-24 DIAGNOSIS — C3431 Malignant neoplasm of lower lobe, right bronchus or lung: Secondary | ICD-10-CM | POA: Diagnosis present

## 2020-08-24 DIAGNOSIS — Z7901 Long term (current) use of anticoagulants: Secondary | ICD-10-CM

## 2020-08-24 DIAGNOSIS — J90 Pleural effusion, not elsewhere classified: Secondary | ICD-10-CM | POA: Diagnosis not present

## 2020-08-24 DIAGNOSIS — Z950 Presence of cardiac pacemaker: Secondary | ICD-10-CM

## 2020-08-24 DIAGNOSIS — I441 Atrioventricular block, second degree: Secondary | ICD-10-CM | POA: Diagnosis present

## 2020-08-24 DIAGNOSIS — Z888 Allergy status to other drugs, medicaments and biological substances status: Secondary | ICD-10-CM | POA: Diagnosis not present

## 2020-08-24 DIAGNOSIS — Z79899 Other long term (current) drug therapy: Secondary | ICD-10-CM

## 2020-08-24 DIAGNOSIS — I493 Ventricular premature depolarization: Secondary | ICD-10-CM | POA: Diagnosis not present

## 2020-08-24 DIAGNOSIS — Z20822 Contact with and (suspected) exposure to covid-19: Secondary | ICD-10-CM | POA: Diagnosis present

## 2020-08-24 DIAGNOSIS — Z9221 Personal history of antineoplastic chemotherapy: Secondary | ICD-10-CM

## 2020-08-24 DIAGNOSIS — Z820 Family history of epilepsy and other diseases of the nervous system: Secondary | ICD-10-CM

## 2020-08-24 DIAGNOSIS — R0602 Shortness of breath: Secondary | ICD-10-CM | POA: Diagnosis not present

## 2020-08-24 HISTORY — DX: Heart failure, unspecified: I50.9

## 2020-08-24 LAB — CBC
HCT: 39 % (ref 39.0–52.0)
Hemoglobin: 12.7 g/dL — ABNORMAL LOW (ref 13.0–17.0)
MCH: 30.2 pg (ref 26.0–34.0)
MCHC: 32.6 g/dL (ref 30.0–36.0)
MCV: 92.9 fL (ref 80.0–100.0)
Platelets: 146 10*3/uL — ABNORMAL LOW (ref 150–400)
RBC: 4.2 MIL/uL — ABNORMAL LOW (ref 4.22–5.81)
RDW: 15.6 % — ABNORMAL HIGH (ref 11.5–15.5)
WBC: 5 10*3/uL (ref 4.0–10.5)
nRBC: 0 % (ref 0.0–0.2)

## 2020-08-24 LAB — BASIC METABOLIC PANEL
Anion gap: 13 (ref 5–15)
BUN: 27 mg/dL — ABNORMAL HIGH (ref 8–23)
CO2: 24 mmol/L (ref 22–32)
Calcium: 10.4 mg/dL — ABNORMAL HIGH (ref 8.9–10.3)
Chloride: 99 mmol/L (ref 98–111)
Creatinine, Ser: 1.19 mg/dL (ref 0.61–1.24)
GFR, Estimated: >60 mL/min (ref >60)
Glucose, Bld: 122 mg/dL — ABNORMAL HIGH (ref 70–99)
Potassium: 3.7 mmol/L (ref 3.5–5.1)
Sodium: 136 mmol/L (ref 135–145)

## 2020-08-24 LAB — SARS CORONAVIRUS 2 (TAT 6-24 HRS): SARS Coronavirus 2: NEGATIVE

## 2020-08-24 MED ORDER — FUROSEMIDE 10 MG/ML IJ SOLN
40.0000 mg | Freq: Every day | INTRAMUSCULAR | Status: DC
  Administered 2020-08-25: 40 mg via INTRAVENOUS
  Filled 2020-08-24: qty 4

## 2020-08-24 MED ORDER — OXYCODONE HCL 5 MG PO TABS
5.0000 mg | ORAL_TABLET | ORAL | Status: DC | PRN
Start: 2020-08-24 — End: 2020-08-29
  Administered 2020-08-27: 5 mg via ORAL
  Filled 2020-08-24: qty 1

## 2020-08-24 MED ORDER — ONDANSETRON HCL 4 MG/2ML IJ SOLN: 4.0000 mg | Freq: Four times a day (QID) | INTRAMUSCULAR | Status: DC | PRN

## 2020-08-24 MED ORDER — FUROSEMIDE 10 MG/ML IJ SOLN
40.0000 mg | Freq: Once | INTRAMUSCULAR | Status: AC
  Administered 2020-08-24: 40 mg via INTRAVENOUS
  Filled 2020-08-24: qty 4

## 2020-08-24 MED ORDER — RIVAROXABAN 20 MG PO TABS
20.0000 mg | ORAL_TABLET | Freq: Every day | ORAL | Status: DC
  Administered 2020-08-24 – 2020-08-28 (×5): 20 mg via ORAL
  Filled 2020-08-24 (×5): qty 1

## 2020-08-24 MED ORDER — HYDROCODONE-HOMATROPINE 5-1.5 MG/5ML PO SYRP
5.0000 mL | ORAL_SOLUTION | Freq: Four times a day (QID) | ORAL | Status: DC | PRN
  Filled 2020-08-24: qty 5

## 2020-08-24 MED ORDER — DOXAZOSIN MESYLATE 2 MG PO TABS
4.0000 mg | ORAL_TABLET | Freq: Every day | ORAL | Status: DC
  Administered 2020-08-26 – 2020-08-29 (×4): 4 mg via ORAL
  Filled 2020-08-24 (×5): qty 2

## 2020-08-24 MED ORDER — ADULT MULTIVITAMIN W/MINERALS CH
1.0000 | ORAL_TABLET | Freq: Every day | ORAL | Status: DC
  Administered 2020-08-25 – 2020-08-29 (×5): 1 via ORAL
  Filled 2020-08-24 (×5): qty 1

## 2020-08-24 MED ORDER — ACETAMINOPHEN 325 MG PO TABS: 650.0000 mg | ORAL_TABLET | Freq: Four times a day (QID) | ORAL | Status: DC | PRN

## 2020-08-24 MED ORDER — ONDANSETRON HCL 4 MG PO TABS: 4.0000 mg | ORAL_TABLET | Freq: Four times a day (QID) | ORAL | Status: DC | PRN

## 2020-08-24 MED ORDER — FLUTICASONE PROPIONATE 50 MCG/ACT NA SUSP: 1.0000 | Freq: Every day | NASAL | Status: DC | PRN

## 2020-08-24 MED ORDER — TEMAZEPAM 7.5 MG PO CAPS: 7.5000 mg | ORAL_CAPSULE | Freq: Every evening | ORAL | Status: DC | PRN

## 2020-08-24 MED ORDER — METOPROLOL SUCCINATE ER 25 MG PO TB24
25.0000 mg | ORAL_TABLET | Freq: Every day | ORAL | Status: DC
  Administered 2020-08-27: 25 mg via ORAL
  Filled 2020-08-24 (×4): qty 1

## 2020-08-24 MED ORDER — ACETAMINOPHEN 650 MG RE SUPP: 650.0000 mg | Freq: Four times a day (QID) | RECTAL | Status: DC | PRN

## 2020-08-24 MED ORDER — ATORVASTATIN CALCIUM 10 MG PO TABS
20.0000 mg | ORAL_TABLET | Freq: Every day | ORAL | Status: DC
  Administered 2020-08-24 – 2020-08-28 (×5): 20 mg via ORAL
  Filled 2020-08-24 (×5): qty 2

## 2020-08-24 MED ORDER — MONTELUKAST SODIUM 10 MG PO TABS
10.0000 mg | ORAL_TABLET | Freq: Every day | ORAL | Status: DC
  Administered 2020-08-25 – 2020-08-29 (×5): 10 mg via ORAL
  Filled 2020-08-24 (×5): qty 1

## 2020-08-24 MED ORDER — POLYETHYLENE GLYCOL 3350 17 G PO PACK: 17.0000 g | PACK | Freq: Every day | ORAL | Status: DC | PRN

## 2020-08-24 NOTE — ED Triage Notes (Signed)
Pt reports he became SOB and dizzy this am and fell forward while trying to set on the toilet. Pt denies hitting his head. A-fib clinic changed two of his meds yesterday. The a-fib PA sent him here today for further evaluation of possible fluid overload. Pt is schedule Tuesday to have a 3 day admission for a new med in hopes it will convert his a-fib

## 2020-08-24 NOTE — Telephone Encounter (Addendum)
   Patient called Answering Service with concerns for dizziness and volume overload. Called and spoke with patient. He reports feeling short of breath and he sounds short of breath on the phone. He states he is having problems with volume overload. He was seen by Malka So, PA-C, yesterday in the [facility] and was transitioned from Lasix to Torsemide. He was still in atrial fibrillation. Diltiazem was stopped and he was started on Toprol. Flecainide also stopped with plans for Tikosyn load next week. He reports dizziness today and actually fell in the bathroom this morning. No syncope. BP is soft with systolic BP in the 338'V. Recommend patient come to the ED for further evaluation. Patient voiced understanding and agreed.  Darreld Mclean, PA-C 08/24/2020 1:21 PM   Patient called back stating he has been waiting in the ED for about 3 hours. I apologized and explained that the ED works under a triage system where the sickest patient's get seen first. Encouraged patient to stay and be seen by ED provider. He is agreeable to this.  Darreld Mclean, PA-C 08/24/2020 4:51 PM

## 2020-08-24 NOTE — ED Provider Notes (Signed)
[place] EMERGENCY DEPARTMENT Provider Note   CSN: [id number] Arrival date & time: 08/24/20  1348     History Chief Complaint  Patient presents with  . Shortness of Breath  . 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  The history is provided by the patient, medical records and the spouse.  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 He presents the emergency department complaining of shortness of breath for the last week. He has a history of atrial fibrillation and is scheduled for an admission in three days for tikosyn load. Yesterday he saw his cardiologist and had some of his medications changed. He has been compliant with these changes. Today when he was sitting on the commode he became dizzy and fell forward. He did not hit his head. He contacted his cardiologist and was directed to the emergency department for evaluation. He does report progressive lower extremity edema, shortness of breath. Symptoms are moderate to severe, constant, worsening.    Past Medical History:  Diagnosis Date  . Allergy   . Arthritis    "right knee and right thumb" (12/19/2014)  . Asthma    pt denies  . Bronchitis 07/2016   started in December and continued about 2 months  . Esophageal stricture   . GERD (gastroesophageal reflux disease)   . Hemochromatosis   . Hiatal hernia   . Hypercalcemia   . Hypercholesteremia   . Hypertension   . Internal hemorrhoids   . Lung cancer ([place])   . Osteoarthritis   . Palpitations   . Presence of permanent cardiac pacemaker    hx bradycardia  . Retinal artery occlusion, branch    "right eye"  . Second degree AV block, Mobitz type II   . Skin cancer    right shoulder  . Stroke [facility]) ~ 2011   "right eye"/ partial blindness  . Tubular adenoma of colon     Patient Active Problem  List   Diagnosis Date Noted  . CHF (congestive heart failure) ([place]) 08/24/2020  . Secondary hypercoagulable state ([place]) 08/12/2020  . Persistent atrial fibrillation ([place])   . LLQ abdominal pain 05/10/2020  . Goals of care, counseling/discussion 03/19/2020  . Encounter for antineoplastic chemotherapy 03/19/2020  . Small cell lung cancer, right lower lobe ([place]) 03/12/2020  . S/P partial lobectomy of lung 03/11/2020  . Postnasal drip 10/13/2016  . S/P right TKA 05/20/2015  . S/P knee replacement 05/20/2015  . Central retinal artery occlusion 06/26/2013    Past Surgical History:  Procedure Laterality Date  . CARDIOVERSION N/A 07/29/2020   Procedure: CARDIOVERSION;  Surgeon: Jerline Pain, MD;  Location: [place];  Service: Cardiovascular;  Laterality: N/A;  NEEDS RAPID COVID TEST MORNING OF  . CARDIOVERSION N/A 08/19/2020   Procedure: CARDIOVERSION;  Surgeon: Thayer Headings, MD;  Location: [place];  Service: Cardiovascular;  Laterality: N/A;  . CATARACT EXTRACTION, BILATERAL  01/2017  . COLONOSCOPY    . EP IMPLANTABLE DEVICE N/A 12/19/2014   Procedure: Pacemaker Implant;  Surgeon: Deboraha Sprang, MD;  Location: [place] CV LAB;  Service: Cardiovascular;  Laterality: N/A;  . ESOPHAGOGASTRODUODENOSCOPY (EGD) WITH ESOPHAGEAL DILATION  X 3  . EYE SURGERY Bilateral 2017  . INGUINAL HERNIA REPAIR Left 1980's  . INSERT / REPLACE / REMOVE PACEMAKER  12/19/2014  . INTERCOSTAL NERVE  BLOCK Right 03/11/2020   Procedure: INTERCOSTAL NERVE BLOCK;  Surgeon: Grace Isaac, MD;  Location: [place];  Service: Thoracic;  Laterality: Right;  . JOINT REPLACEMENT Right 2016  . KNEE ARTHROSCOPY Right ~ 1982; ~ 1992  . MOHS SURGERY Right ~ 2014   "pre-melanoma scapula"  . POSTERIOR TIBIAL TENDON REPAIR Left 2012  . TONSILLECTOMY  1946  . TOTAL KNEE ARTHROPLASTY Right 05/20/2015   Procedure: RIGHT TOTAL KNEE ARTHROPLASTY;  Surgeon: Paralee Cancel, MD;  Location: [facility];  Service: Orthopedics;   Laterality: Right;  . UPPER GASTROINTESTINAL ENDOSCOPY    . VIDEO BRONCHOSCOPY N/A 03/11/2020   Procedure: VIDEO BRONCHOSCOPY;  Surgeon: Grace Isaac, MD;  Location: [facility] OR;  Service: Thoracic;  Laterality: N/A;  . XI ROBOTIC ASSISTED THORACOSCOPY- SEGMENTECTOMY Right 03/11/2020   Procedure: XI ROBOTIC ASSISTED THORACOSCOPY-RIGHT SUPERIOR SEGMENTECTOMY WITH NODE SAMPLES;  Surgeon: Grace Isaac, MD;  Location: [facility] OR;  Service: Thoracic;  Laterality: Right;       Family History  Problem Relation Age of Onset  . Colon cancer Mother        dx in her 27s  . Heart disease Mother   . Alzheimer's disease Mother   . Colon cancer Father        dx in his early 47s  . Heart disease Father   . Prostate cancer Father   . Prostate cancer Brother        1/2 brother  . Hemochromatosis Brother   . Prostate cancer Brother   . Esophageal cancer Neg Hx   . Pancreatic cancer Neg Hx   . Stomach cancer Neg Hx   . Liver disease Neg Hx     Social History   Tobacco Use  . Smoking status: Former Smoker    Years: 3.00    Types: Cigarettes    Quit date: 05/16/1962    Years since quitting: 58.3  . Smokeless tobacco: Never Used  . Tobacco comment: "quit smoking in the 1960's"; was an intermittent smoker   Vaping Use  . Vaping Use: Never used  Substance Use Topics  . Alcohol use: No    Comment: 12/19/2014 "stopped drinking in ~ 2012"  . Drug use: No    Home Medications Prior to Admission medications   Medication Sig Start Date End Date Taking? Authorizing Provider  acetaminophen (TYLENOL) 500 MG tablet Take 500-1,000 mg by mouth every 6 (six) hours as needed (for pain).   Yes [provider]  atorvastatin (LIPITOR) 20 MG tablet Take 1 tablet (20 mg total) by mouth daily. Patient taking differently: Take 20 mg by mouth at bedtime. 08/14/20 11/12/20 Yes Fenton, Clint R, PA  celecoxib (CELEBREX) 200 MG capsule Take 1 capsule (200 mg total) by mouth 2 (two) times daily as needed for  mild pain. 03/13/20  Yes Barrett, Erin R, PA-C  diazepam (VALIUM) 5 MG tablet Take 5 mg by mouth at bedtime as needed (for sleep). 08/13/20  Yes [provider]  doxazosin (CARDURA) 4 MG tablet Take 4 mg by mouth daily.  12/27/18  Yes [provider]  esomeprazole (NEXIUM) 20 MG capsule Take 20 mg by mouth daily before breakfast.   Yes [provider]  fluticasone (FLONASE) 50 MCG/ACT nasal spray Place 1 spray into both nostrils daily as needed for allergies or rhinitis. 08/09/20  Yes [provider]  Glycerin-Hypromellose-PEG 400 (DRY EYE RELIEF DROPS) 0.2-0.2-1 % SOLN Place 1 drop into both eyes daily as needed (for dryness). Saline  Yes [provider]  HYDROcodone-homatropine (HYCODAN) 5-1.5 MG/5ML syrup Take 5 mLs by mouth every 6 (six) hours as needed for cough. 02/01/20  Yes [provider]  melatonin 3 MG TABS tablet Take 3 mg by mouth at bedtime as needed (for sleep).   Yes [provider]  metoprolol succinate (TOPROL XL) 25 MG 24 hr tablet Take 1 tablet (25 mg total) by mouth at bedtime. 08/23/20 08/23/21 Yes Fenton, Clint R, PA  montelukast (SINGULAIR) 10 MG tablet Take 10 mg by mouth every morning.  07/05/13  Yes [provider]  Multiple Vitamin (MULTIVITAMIN) capsule Take 1 capsule by mouth daily.   Yes [provider]  Multiple Vitamins-Minerals (PRESERVISION AREDS 2 PO) Take 1 capsule by mouth in the morning and at bedtime.    Yes [provider]  potassium chloride SA (KLOR-CON) 20 MEQ tablet Take 3 tablets (60 mEq total) by mouth daily. 08/23/20  Yes Fenton, Clint R, PA  rivaroxaban (XARELTO) 20 MG TABS tablet Take 1 tablet (20 mg total) by mouth daily with supper. 07/05/20  Yes Shirley Friar, PA-C  temazepam (RESTORIL) 15 MG capsule Take 15 mg by mouth at bedtime as needed for sleep. 08/19/20  Yes [provider]  torsemide (DEMADEX) 20 MG tablet Take 2 tablets (40 mg total) by mouth  daily. 08/23/20  Yes Fenton, Clint R, PA  vitamin C (ASCORBIC ACID) 500 MG tablet Take 500 mg by mouth at bedtime.    Yes [provider]  acetaminophen (TYLENOL) 325 MG tablet Take 2 tablets (650 mg total) by mouth every 6 (six) hours as needed for mild pain or moderate pain. Patient not taking: Reported on 08/24/2020 05/21/15   Danae Orleans, PA-C  Misc Natural Products (GLUCOSAMINE CHONDROITIN ADV PO) Take 1,500 mg by mouth at bedtime.     [provider]    Allergies    Lisinopril  Review of Systems   Review of Systems  Respiratory: Positive for shortness of breath.   Neurological: Positive for dizziness.  All other systems reviewed and are negative.   Physical Exam Updated Vital Signs BP 123/69 (BP Location: Left Arm)   Pulse 72   Temp (!) 93.7 F (34.3 C) (Oral)   Resp 19   Ht 5\' 10"  (1.778 m)   Wt 75.3 kg   SpO2 100%   BMI 23.82 kg/m   Physical Exam Vitals and nursing note reviewed.  Constitutional:      Appearance: He is well-developed and well-nourished.  HENT:     Head: Normocephalic and atraumatic.  Cardiovascular:     Rate and Rhythm: Normal rate and regular rhythm.     Heart sounds: No murmur heard.   Pulmonary:     Effort: Pulmonary effort is normal. No respiratory distress.     Breath sounds: Normal breath sounds.  Abdominal:     Palpations: Abdomen is soft.     Tenderness: There is no abdominal tenderness. There is no guarding or rebound.  Musculoskeletal:        General: No tenderness or edema.     Comments: 2+ pitting edema to BLE, right slightly greater than left  Skin:    General: Skin is warm and dry.  Neurological:     Mental Status: He is alert and oriented to person, place, and time.  Psychiatric:        Mood and Affect: Mood and affect normal.        Behavior: Behavior normal.  ED Results / Procedures / Treatments   Labs (all labs ordered are listed, but only abnormal results are displayed) Labs Reviewed   BASIC METABOLIC PANEL - Abnormal; Notable for the following components:      Result Value   Glucose, Bld 122 (*)    BUN 27 (*)    Calcium 10.4 (*)    All other components within normal limits  CBC - Abnormal; Notable for the following components:   RBC 4.20 (*)    Hemoglobin 12.7 (*)    RDW 15.6 (*)    Platelets 146 (*)    All other components within normal limits  BRAIN NATRIURETIC PEPTIDE  COMPREHENSIVE METABOLIC PANEL  CBC    EKG EKG Interpretation  Date/Time:  Saturday August 24 2020 14:12:27 EST Ventricular Rate:  61 PR Interval:    QRS Duration: 176 QT Interval:  494 QTC Calculation: 497 R Axis:   -81 Text Interpretation: Ventricular-paced rhythm Abnormal ECG Confirmed by Quintella Reichert [phone]) on 08/24/2020 5:42:48 PM   Radiology DG Chest 2 View  Result Date: 08/24/2020 CLINICAL DATA:  Shortness of breath.  Status post fall. EXAM: CHEST - 2 VIEW COMPARISON:  August 06, 2020, March 11, 2020 FINDINGS: The heart size and mediastinal contours are stable. Cardiac pacemaker is unchanged. Mild increased pulmonary interstitium is identified bilaterally unchanged compared to March 11, 2020, chronic. Minimal bilateral pleural effusions are noted. No focal pneumonia is noted. The visualized skeletal structures are stable. IMPRESSION: No acute cardiopulmonary disease identified. Electronically Signed   By: Abelardo Diesel M.D.   On: 08/24/2020 14:59    Procedures Procedures (including critical care time)  Medications Ordered in ED Medications  furosemide (LASIX) injection 40 mg (has no administration in time range)  acetaminophen (TYLENOL) tablet 650 mg (has no administration in time range)    Or  acetaminophen (TYLENOL) suppository 650 mg (has no administration in time range)  oxyCODONE (Oxy IR/ROXICODONE) immediate release tablet 5 mg (has no administration in time range)  polyethylene glycol (MIRALAX / GLYCOLAX) packet 17 g (has no administration in time range)   ondansetron (ZOFRAN) tablet 4 mg (has no administration in time range)    Or  ondansetron (ZOFRAN) injection 4 mg (has no administration in time range)  atorvastatin (LIPITOR) tablet 20 mg (20 mg Oral Given 08/24/20 2314)  doxazosin (CARDURA) tablet 4 mg (has no administration in time range)  metoprolol succinate (TOPROL-XL) 24 hr tablet 25 mg (25 mg Oral Not Given 08/24/20 2310)  rivaroxaban (XARELTO) tablet 20 mg (20 mg Oral Given 08/24/20 2315)  temazepam (RESTORIL) capsule 7.5 mg (has no administration in time range)  multivitamin with minerals tablet 1 tablet (has no administration in time range)  fluticasone (FLONASE) 50 MCG/ACT nasal spray 1 spray (has no administration in time range)  HYDROcodone-homatropine (HYCODAN) 5-1.5 MG/5ML syrup 5 mL (has no administration in time range)  montelukast (SINGULAIR) tablet 10 mg (has no administration in time range)  furosemide (LASIX) injection 40 mg (40 mg Intravenous Given 08/24/20 1951)    ED Course  I have reviewed the triage vital signs and the nursing notes.  Pertinent labs & imaging results that were available during my care of the patient were reviewed by me and considered in my medical decision making (see chart for details).    MDM Rules/Calculators/A&P                          Pt with hx/o afib here for evaluation of  progressive BLE and sob in setting of recent medication changes and plan for admission for Tikosyn load.  On exam he is volume overloaded but in no acute distress.  EKG with paced rhythm.  Labs stable compared to priors.  CXR without pulmonary edema.  Doubt PE.  Given degree of symptoms he was treated with IVF lasix.  Dr. Radford Pax with Cardiology consulted for recommendations - recommends medicine admit and EP eval in am.  Medicine consulted for admission.   Final Clinical Impression(s) / ED Diagnoses Final diagnoses:  None    Rx / DC Orders ED Discharge Orders    None       Quintella Reichert, MD 08/25/20 [phone]

## 2020-08-24 NOTE — H&P (Signed)
TRH H&P    Patient Demographics:    Alexander Murray, is a 80 y.o. male  MRN: [id number]  DOB - [birth year]  Admit Date - 08/24/2020  Referring MD/NP/PA: Ralene Bathe  Outpatient Primary MD for the patient is [name], MD  Patient coming from: Home  Chief complaint-dyspnea   HPI:    Alexander Murray  is a 80 y.o. male, with history of stroke, skin cancer, second-degree AV block Mobitz type II with pacemaker, lung cancer, hypertension, hyperlipidemia, GERD, and more presents to the ED with a chief complaint of severe dyspnea.  Patient reports its been on and off for a while, but it got worse the last 2 or 3 days.  The shortness of breath was progressively worse over those 2 or 3 days.  He reports that he has had 2 cardioversions with the last one being 5 days ago.  He feels as though he is back into A. fib because of this dyspnea.  He reports it is worse with exertion, better with rest.  He has no palpitations.  He recently had medication changes in the cardiology clinic they stopped Cardizem and Lasix, and he started torsemide and metoprolol.  Patient reports that he does not think he has had adequate urine output with the torsemide.  Patient also reports they told him to stop his flecainide with the plan for admission to load with Tikosyn in 72 hours.  Patient reports that in the morning on 08/25/2020 he will have been 48 hours without his flecainide.  Patient reports that today he did feel dizzy and fell in his bathroom around 11:45 AM.  He did not hit his head.  He has had no change in vision, no change in hearing, no acute asymmetrical weakness.  He reports that he is had no chest pain.  He is not sure if his dizziness was secondary to his dyspnea today or his Restoril.  He reports that he has been taking Restoril for the last 2 nights for sleep, but that it leaves him with a 10-hour hangover in the a.m.  Patient also feeling  like he is fluid overloaded.  He reports that he has fluid on his abdomen.  It has decreased his appetite because he feels full after 1 or 2 bites when he has fluid on his abdomen like this.  He complains of some postnasal drip that just incidental on review of systems.  He also reports that he has had some very minor nosebleeds after using fluticasone.  Patient has no other complaints at this time.  Patient is full code.  In the ED Temp 97.6, heart rate 58-73, respiratory rate 12-19, blood pressure soft at 105/73, satting at 99% on room air No leukocytosis with a white blood cell count of 5.0, hemoglobin 12.7 Chemistry panel is unremarkable with a creatinine of 1.19 which is very close to his baseline. COVID pending Recent TSH is 5.630, T4 is 1.11 EKG shows a heart rate of 61, ventricular paced rhythm Chest x-ray shows no acute cardiopulmonary disease Cardiology was consulted in the  ED and reports they will see in the a.m. 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    Review of systems:    In addition to the HPI above,  No Fever-chills, No Headache, No changes with Vision or hearing, No problems swallowing food or Liquids, No Chest pain, Cough admits to shortness of Breath, No Abdominal pain, No Nausea or Vomiting, admits to mild constipation for which MiraLAX was effective No Blood in stool or Urine, No dysuria, No new skin rashes bruises on the right hand, and easy bruising secondary to Xarelto No new joints pains-aches,  No new weakness, tingling, numbness in any extremity, No recent weight gain or loss, No polyuria, polydypsia or polyphagia, No significant Mental Stressors.  All other systems reviewed and are negative.    Past History of the following :    Past Medical History:  Diagnosis Date  . Allergy   . Arthritis    "right knee and right thumb" (12/19/2014)  . Asthma    pt denies  . Bronchitis 07/2016   started in  December and continued about 2 months  . Esophageal stricture   . GERD (gastroesophageal reflux disease)   . Hemochromatosis   . Hiatal hernia   . Hypercalcemia   . Hypercholesteremia   . Hypertension   . Internal hemorrhoids   . Lung cancer ([place])   . Osteoarthritis   . Palpitations   . Presence of permanent cardiac pacemaker    hx bradycardia  . Retinal artery occlusion, branch    "right eye"  . Second degree AV block, Mobitz type II   . Skin cancer    right shoulder  . Stroke [facility]) ~ 2011   "right eye"/ partial blindness  . Tubular adenoma of colon       Past Surgical History:  Procedure Laterality Date  . CARDIOVERSION N/A 07/29/2020   Procedure: CARDIOVERSION;  Surgeon: Jerline Pain, MD;  Location: [place];  Service: Cardiovascular;  Laterality: N/A;  NEEDS RAPID COVID TEST MORNING OF  . CARDIOVERSION N/A 08/19/2020   Procedure: CARDIOVERSION;  Surgeon: Thayer Headings, MD;  Location: [place];  Service: Cardiovascular;  Laterality: N/A;  . CATARACT EXTRACTION, BILATERAL  01/2017  . COLONOSCOPY    . EP IMPLANTABLE DEVICE N/A 12/19/2014   Procedure: Pacemaker Implant;  Surgeon: Deboraha Sprang, MD;  Location: [place] [facility];  Service: Cardiovascular;  Laterality: N/A;  . ESOPHAGOGASTRODUODENOSCOPY (EGD) WITH ESOPHAGEAL DILATION  X 3  . EYE SURGERY Bilateral 2017  . INGUINAL HERNIA REPAIR Left 1980's  . INSERT / REPLACE / REMOVE PACEMAKER  12/19/2014  . INTERCOSTAL NERVE BLOCK Right 03/11/2020   Procedure: INTERCOSTAL NERVE BLOCK;  Surgeon: Grace Isaac, MD;  Location: [place];  Service: Thoracic;  Laterality: Right;  . JOINT REPLACEMENT Right 2016  . KNEE ARTHROSCOPY Right ~ 1982; ~ 1992  . MOHS SURGERY Right ~ 2014   "pre-melanoma scapula"  . POSTERIOR TIBIAL TENDON REPAIR Left 2012  . TONSILLECTOMY  1946  . TOTAL KNEE ARTHROPLASTY Right 05/20/2015   Procedure: RIGHT TOTAL KNEE ARTHROPLASTY;  Surgeon: Paralee Cancel, MD;  Location: [facility];  Service:  Orthopedics;  Laterality: Right;  . UPPER GASTROINTESTINAL ENDOSCOPY    . VIDEO BRONCHOSCOPY N/A 03/11/2020   Procedure: VIDEO BRONCHOSCOPY;  Surgeon: Grace Isaac, MD;  Location: [facility];  Service: Thoracic;  Laterality: N/A;  . XI ROBOTIC ASSISTED THORACOSCOPY- SEGMENTECTOMY Right 03/11/2020   Procedure: XI ROBOTIC ASSISTED THORACOSCOPY-RIGHT  SUPERIOR SEGMENTECTOMY WITH NODE SAMPLES;  Surgeon: Grace Isaac, MD;  Location: [facility];  Service: Thoracic;  Laterality: Right;      Social History:      Social History   Tobacco Use  . Smoking status: Former Smoker    Years: 3.00    Types: Cigarettes    Quit date: 05/16/1962    Years since quitting: 58.3  . Smokeless tobacco: Never Used  . Tobacco comment: "quit smoking in the 1960's"; was an intermittent smoker   Substance Use Topics  . Alcohol use: No    Comment: 12/19/2014 "stopped drinking in ~ 2012"       Family History :     Family History  Problem Relation Age of Onset  . Colon cancer Mother        dx in her 36s  . Heart disease Mother   . Alzheimer's disease Mother   . Colon cancer Father        dx in his early 28s  . Heart disease Father   . Prostate cancer Father   . Prostate cancer Brother        1/2 brother  . Hemochromatosis Brother   . Prostate cancer Brother   . Esophageal cancer Neg Hx   . Pancreatic cancer Neg Hx   . Stomach cancer Neg Hx   . Liver disease Neg Hx       Home Medications:   Prior to Admission medications   Medication Sig Start Date End Date Taking? Authorizing Provider  acetaminophen (TYLENOL) 500 MG tablet Take 500-1,000 mg by mouth every 6 (six) hours as needed (for pain).   Yes [provider]  atorvastatin (LIPITOR) 20 MG tablet Take 1 tablet (20 mg total) by mouth daily. Patient taking differently: Take 20 mg by mouth at bedtime. 08/14/20 11/12/20 Yes Fenton, Clint R, PA  celecoxib (CELEBREX) 200 MG capsule Take 1 capsule (200 mg total) by mouth 2 (two) times daily as  needed for mild pain. 03/13/20  Yes Barrett, Erin R, PA-C  diazepam (VALIUM) 5 MG tablet Take 5 mg by mouth at bedtime as needed (for sleep). 08/13/20  Yes [provider]  doxazosin (CARDURA) 4 MG tablet Take 4 mg by mouth daily.  12/27/18  Yes [provider]  esomeprazole (NEXIUM) 20 MG capsule Take 20 mg by mouth daily before breakfast.   Yes [provider]  fluticasone (FLONASE) 50 MCG/ACT nasal spray Place 1 spray into both nostrils daily as needed for allergies or rhinitis. 08/09/20  Yes [provider]  Glycerin-Hypromellose-PEG 400 (DRY EYE RELIEF DROPS) 0.2-0.2-1 % SOLN Place 1 drop into both eyes daily as needed (for dryness). Saline   Yes [provider]  HYDROcodone-homatropine (HYCODAN) 5-1.5 MG/5ML syrup Take 5 mLs by mouth every 6 (six) hours as needed for cough. 02/01/20  Yes [provider]  melatonin 3 MG TABS tablet Take 3 mg by mouth at bedtime as needed (for sleep).   Yes [provider]  metoprolol succinate (TOPROL XL) 25 MG 24 hr tablet Take 1 tablet (25 mg total) by mouth at bedtime. 08/23/20 08/23/21 Yes Fenton, Clint R, PA  montelukast (SINGULAIR) 10 MG tablet Take 10 mg by mouth every morning.  07/05/13  Yes [provider]  Multiple Vitamin (MULTIVITAMIN) capsule Take 1 capsule by mouth daily.   Yes [provider]  Multiple Vitamins-Minerals (PRESERVISION AREDS 2 PO) Take 1 capsule by mouth in the morning and at bedtime.    Yes  [provider]  potassium chloride SA (KLOR-CON) 20 MEQ tablet Take 3 tablets (60 mEq total) by mouth daily. 08/23/20  Yes Fenton, Clint R, PA  rivaroxaban (XARELTO) 20 MG TABS tablet Take 1 tablet (20 mg total) by mouth daily with supper. 07/05/20  Yes Shirley Friar, PA-C  temazepam (RESTORIL) 15 MG capsule Take 15 mg by mouth at bedtime as needed for sleep. 08/19/20  Yes [provider]  torsemide (DEMADEX) 20 MG tablet Take 2 tablets (40 mg total)  by mouth daily. 08/23/20  Yes Fenton, Clint R, PA  vitamin C (ASCORBIC ACID) 500 MG tablet Take 500 mg by mouth at bedtime.    Yes [provider]  acetaminophen (TYLENOL) 325 MG tablet Take 2 tablets (650 mg total) by mouth every 6 (six) hours as needed for mild pain or moderate pain. Patient not taking: Reported on 08/24/2020 05/21/15   Danae Orleans, PA-C  Misc Natural Products (GLUCOSAMINE CHONDROITIN ADV PO) Take 1,500 mg by mouth at bedtime.     [provider]     Allergies:     Allergies  Allergen Reactions  . Lisinopril Palpitations and Other (See Comments)    Dizziness, also     Physical Exam:   Vitals  Blood pressure 105/73, pulse 61, temperature (!) 96.4 F (35.8 C), temperature source Axillary, resp. rate 12, height 5\' 10"  (1.778 m), weight 75.3 kg, SpO2 99 %.  1.  General: Patient lying supine in bed with head of bed elevated in no acute distress  2. Psychiatric: Alert and oriented x3, cooperative, pleasant, mood and behavior normal for situation  3. Neurologic: Face is symmetric, moves all 4 extremities voluntarily, speech and language are normal, alert and oriented x3  4. HEENMT:  Head is atraumatic, normocephalic, pupils are reactive to light, neck is supple, trachea is midline, mucous membranes are moist  5. Respiratory : Lungs are clear to auscultation bilaterally, no crackles, no rhonchi, no wheeze  6. Cardiovascular : Heart rate is bradycardic, rhythm seems regular, no murmurs rubs or gallops  7. Gastrointestinal:  Abdomen is soft, nondistended, nontender to palpation  8. Skin:  Skin is warm dry and intact without acute lesion on limited skin exam  9.Musculoskeletal:  Peripheral pulses palpated, no calf tenderness, no acute deformity    Data Review:    CBC Recent Labs  Lab 08/24/20 1430  WBC 5.0  HGB 12.7*  HCT 39.0  PLT 146*  MCV 92.9  MCH 30.2  MCHC 32.6  RDW 15.6*    ------------------------------------------------------------------------------------------------------------------  Results for orders placed or performed during the hospital encounter of 08/24/20 (from the past 48 hour(s))  Basic metabolic panel     Status: Abnormal   Collection Time: 08/24/20  2:30 PM  Result Value Ref Range   Sodium 136 135 - 145 mmol/L   Potassium 3.7 3.5 - 5.1 mmol/L   Chloride 99 98 - 111 mmol/L   CO2 24 22 - 32 mmol/L   Glucose, Bld 122 (H) 70 - 99 mg/dL    Comment: Glucose reference range applies only to samples taken after fasting for at least 8 hours.   BUN 27 (H) 8 - 23 mg/dL   Creatinine, Ser 1.19 0.61 - 1.24 mg/dL   Calcium 10.4 (H) 8.9 - 10.3 mg/dL   GFR, Estimated >60 >60 mL/min    Comment: (NOTE) Calculated using the CKD-EPI Creatinine Equation (2021)    Anion gap 13 5 - 15    Comment: Performed at [facility], [place] [address] [place], [place] [address]  CBC     Status: Abnormal   Collection Time: 08/24/20  2:30 PM  Result Value Ref Range   WBC 5.0 4.0 - 10.5 K/uL   RBC 4.20 (L) 4.22 - 5.81 MIL/uL   Hemoglobin 12.7 (L) 13.0 - 17.0 g/dL   HCT 39.0 39.0 - 52.0 %   MCV 92.9 80.0 - 100.0 fL   MCH 30.2 26.0 - 34.0 pg   MCHC 32.6 30.0 - 36.0 g/dL   RDW 15.6 (H) 11.5 - 15.5 %   Platelets 146 (L) 150 - 400 K/uL   nRBC 0.0 0.0 - 0.2 %    Comment: Performed at [facility] [address], [place], [place] [address]    Chemistries  Recent Labs  Lab 08/23/20 1401 08/24/20 1430  NA 135 136  K 3.5 3.7  CL 97* 99  CO2 26 24  GLUCOSE 172* 122*  BUN 24* 27*  CREATININE 1.14 1.19  CALCIUM 10.1 10.4*  MG 2.0  --   AST 39  --   ALT 29  --   ALKPHOS 214*  --   BILITOT 1.9*  --    ------------------------------------------------------------------------------------------------------------------  ------------------------------------------------------------------------------------------------------------------ GFR: Estimated  Creatinine Clearance: 50.3 mL/min (by C-G formula based on SCr of 1.19 mg/dL). Liver Function Tests: Recent Labs  Lab 08/23/20 1401  AST 39  ALT 29  ALKPHOS 214*  BILITOT 1.9*  PROT 6.2*  ALBUMIN 3.6   No results for input(s): LIPASE, AMYLASE in the last 168 hours. No results for input(s): AMMONIA in the last 168 hours. Coagulation Profile: No results for input(s): INR, PROTIME in the last 168 hours. Cardiac Enzymes: No results for input(s): CKTOTAL, CKMB, CKMBINDEX, TROPONINI in the last 168 hours. BNP (last 3 results) No results for input(s): PROBNP in the last 8760 hours. HbA1C: No results for input(s): HGBA1C in the last 72 hours. CBG: No results for input(s): GLUCAP in the last 168 hours. Lipid Profile: No results for input(s): CHOL, HDL, LDLCALC, TRIG, CHOLHDL, LDLDIRECT in the last 72 hours. Thyroid Function Tests: Recent Labs    08/23/20 1427  TSH 5.630*  FREET4 1.11   Anemia Panel: No results for input(s): VITAMINB12, FOLATE, FERRITIN, TIBC, IRON, RETICCTPCT in the last 72 hours.  --------------------------------------------------------------------------------------------------------------- Urine analysis:    Component Value Date/Time   COLORURINE YELLOW 03/08/2020 1330   APPEARANCEUR CLEAR 03/08/2020 1330   LABSPEC 1.011 03/08/2020 1330   PHURINE 7.0 03/08/2020 1330   GLUCOSEU NEGATIVE 03/08/2020 1330   HGBUR NEGATIVE 03/08/2020 1330   BILIRUBINUR NEGATIVE 03/08/2020 1330   KETONESUR NEGATIVE 03/08/2020 1330   PROTEINUR NEGATIVE 03/08/2020 1330   UROBILINOGEN 1.0 05/17/2015 0858   NITRITE NEGATIVE 03/08/2020 1330   LEUKOCYTESUR NEGATIVE 03/08/2020 1330      Imaging Results:    DG Chest 2 View  Result Date: 08/24/2020 CLINICAL DATA:  Shortness of breath.  Status post fall. EXAM: CHEST - 2 VIEW COMPARISON:  August 06, 2020, March 11, 2020 FINDINGS: The heart size and mediastinal contours are stable. Cardiac pacemaker is unchanged. Mild increased  pulmonary interstitium is identified bilaterally unchanged compared to March 11, 2020, chronic. Minimal bilateral pleural effusions are noted. No focal pneumonia is noted. The visualized skeletal structures are stable. IMPRESSION: No acute cardiopulmonary disease identified. Electronically Signed   By: Abelardo Diesel M.D.   On: 08/24/2020 14:59    My personal review of EKG: Rhythm ventricular paced heart rate of 61   Assessment &  Plan:    Active Problems:   CHF (congestive heart failure) ([place])   1. CHF exacerbation on chronic diastolic heart failure 1. EF 55-60% earlier this month 2. History of chronic diastolic CHF 3. Lasix 40 mg IV given in ED, no urine response yet 4. Fluid restriction 5. Strict I's and O's 6. Monitor on telemetry 7. Most likely secondary to A. fib 2. Afib 1. Patient is following closely with cardiology regarding A. Fib 2. He reports that he has had 2 cardioversions with the last being 5 days ago 3. Patient was already planning on admission on Tuesday for Tikosyn load, but could not tolerate shortness of breath at home 4. Cardiology consulted from the ED and will see in the a.m. 5. Continue Xarelto 6. Continue metoprolol 7. Holding flecainide for Tikosyn conversion 3. HLD 1. Continue statin 4. HTN 1. Continue metoprolol with holding parameters as blood pressure is soft at this time 5.    DVT Prophylaxis-   Xarelto - SCDs   AM Labs Ordered, also please review Full Orders  Family Communication: No family at bedside Code Status:  Full Admission status:Inpatient :The appropriate admission status for this patient is INPATIENT. Inpatient status is judged to be reasonable and necessary in order to provide the required intensity of service to ensure the patient's safety. The patient's presenting symptoms, physical exam findings, and initial radiographic and laboratory data in the context of their chronic comorbidities is felt to place them at high risk for further  clinical deterioration. Furthermore, it is not anticipated that the patient will be medically stable for discharge from the hospital within 2 midnights of admission. The following factors support the admission status of inpatient.     The patient's presenting symptoms include shortness of breath, dizziness The worrisome physical exam findings include generalized weakness, possible underlying afib on EKG The chronic co-morbidities include HLD, GERD, Mobitz II with pacemaker, HTN, Afib       * I certify that at the point of admission it is my clinical judgment that the patient will require inpatient hospital care spanning beyond 2 midnights from the point of admission due to high intensity of service, high risk for further deterioration and high frequency of surveillance required.*  Time spent in minutes : [place]

## 2020-08-25 LAB — COMPREHENSIVE METABOLIC PANEL
ALT: 24 U/L (ref 0–44)
AST: 34 U/L (ref 15–41)
Albumin: 3.4 g/dL — ABNORMAL LOW (ref 3.5–5.0)
Alkaline Phosphatase: 218 U/L — ABNORMAL HIGH (ref 38–126)
Anion gap: 11 (ref 5–15)
BUN: 29 mg/dL — ABNORMAL HIGH (ref 8–23)
CO2: 25 mmol/L (ref 22–32)
Calcium: 10.3 mg/dL (ref 8.9–10.3)
Chloride: 101 mmol/L (ref 98–111)
Creatinine, Ser: 1.26 mg/dL — ABNORMAL HIGH (ref 0.61–1.24)
GFR, Estimated: 57 mL/min — ABNORMAL LOW (ref >60)
Glucose, Bld: 110 mg/dL — ABNORMAL HIGH (ref 70–99)
Potassium: 3.7 mmol/L (ref 3.5–5.1)
Sodium: 137 mmol/L (ref 135–145)
Total Bilirubin: 2 mg/dL — ABNORMAL HIGH (ref 0.3–1.2)
Total Protein: 6 g/dL — ABNORMAL LOW (ref 6.5–8.1)

## 2020-08-25 LAB — CBC
HCT: 35.5 % — ABNORMAL LOW (ref 39.0–52.0)
Hemoglobin: 12.4 g/dL — ABNORMAL LOW (ref 13.0–17.0)
MCH: 31.2 pg (ref 26.0–34.0)
MCHC: 34.9 g/dL (ref 30.0–36.0)
MCV: 89.4 fL (ref 80.0–100.0)
Platelets: 130 10*3/uL — ABNORMAL LOW (ref 150–400)
RBC: 3.97 MIL/uL — ABNORMAL LOW (ref 4.22–5.81)
RDW: 15.6 % — ABNORMAL HIGH (ref 11.5–15.5)
WBC: 6.1 10*3/uL (ref 4.0–10.5)
nRBC: 0 % (ref 0.0–0.2)

## 2020-08-25 LAB — BRAIN NATRIURETIC PEPTIDE: B Natriuretic Peptide: 976.1 pg/mL — ABNORMAL HIGH (ref 0.0–100.0)

## 2020-08-25 MED ORDER — POTASSIUM CHLORIDE CRYS ER 20 MEQ PO TBCR
40.0000 meq | EXTENDED_RELEASE_TABLET | Freq: Once | ORAL | Status: AC
  Administered 2020-08-25: 40 meq via ORAL
  Filled 2020-08-25: qty 2

## 2020-08-25 NOTE — Progress Notes (Signed)
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

## 2020-08-25 NOTE — Progress Notes (Signed)
Pt arrived to the floor alert and oriented, walked to the bed, CHG bath done, cardiac monitoring initiated, CCMD notified. Pt's temperature is in the low end,93.7 orally, MD notified and advised to apply a bear warming blanket. We'll continue to monitor.

## 2020-08-25 NOTE — Plan of Care (Signed)
POC initiated and progressing. 

## 2020-08-25 NOTE — Progress Notes (Signed)
[employer] illustrator.

## 2020-08-25 NOTE — Progress Notes (Signed)
PROGRESS NOTE    Alexander Murray, Dr.  JXB:[id number] DOB: [birth year] DOA: 08/24/2020 PCP: [name], MD    Brief Narrative:  Alexander Murray, Dr. 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  Symptoms of dyspnea have been progressing over the last 2-3 days with increased lower extremity edema.  Also, patient reports decreased appetite given that he feels full with some abdominal distention which he relates to fluid overload.  Additionally, patient reports that he fell in his bathroom after feeling dizzy but denies any loss of consciousness or syncopal episode.    Recent cardioversions, last being 5 days ago for his atrial fibrillation.  Symptoms worse with exertion and improved with rest.  Recently had medication changes by cardiology with discontinuation of Cardizem and furosemide and started torsemide and metoprolol.  Patient reports that he does not believe he has had adequate urine output with the torsemide.  He was also instructed recently to stop his flecainide with planned transition to Tikosyn.  Patient reports last dose of flecainide was the morning of 08/23/2020.  In the ED, temperature 97.6, HR 58-73, RR 12, BP 105/73, SPO2 99% on room air.  Sodium 136, potassium 3.7, chloride 99, CO2 24, glucose 122, BUN 27, creatinine 1.19.  WBC 5.0, hemoglobin 12.7, platelets 146.  SARS-CoV-2/COVID-19 negative.  CXR with no acute cardiopulmonary disease identified.  EKG notable for V paced rhythm, rate 61.  Cardiology was consulted by EDP for admission, deferred to hospitalist admission and Dr. Radford Pax stated EP will follow  patient in the a.m.   Assessment & Plan:   Active Problems:   S/P partial lobectomy of lung   Small cell lung cancer, right lower lobe ([name])   Persistent atrial fibrillation ([name])   CHF (congestive heart failure) ([name])   Acute on chronic diastolic congestive heart failure Patient presenting with progressive shortness of breath, recent changes by cardiology clinic with transition of furosemide to torsemide.  Patient continues with lower extremity edema.  Elevated BNP of 976.  Chest x-ray with no acute cardiopulmonary disease process.  --Cardiology consulted, Dr. Radford Pax recommends EP evaluation --net negative 58mL since admission --wt 166>163.5 lbs --Furosemide 40 mg IV daily --Strict I's and O's and daily weights  Permanent atrial fibrillation Patient follows with cardiology/EP Dr. Caryl Comes outpatient.  Recent cardioversion 08/19/2020.  Patient was previously on flecainide, which has been discontinued with last dose reported on 08/23/2020 with planned initiation of Tikosyn.  EKG with V paced rhythm, rate 61.  TSH 5.630 with a free T4 of 1.11. --Continue metoprolol succinate 25 mg p.o. daily --Xarelto 20 mg p.o. daily --Await further cardiology recommendations, with plans initiation of Tikosyn  HLD: Continue statin  Hx essential hypertension: Patient with recent discontinuation of Cardizem.  Patient reports that his blood pressures have been low normal as of recent, he relates it to his diagnosis of atrial fibrillation. --Continue metoprolol succinate 25 mg p.o. daily  Small cell lung cancer Patient follows with medical oncology, Dr. Earlie Server.  Reports completed chemotherapy in November 2021.  Continue outpatient follow-up with oncology.  Insomnia Patient reports "groggy feeling" with temazepam which may have caused some of his symptoms regards to dizziness. --will discontinue temazepam for now --Can consider low-dose melatonin  if needed for further insomnia.  DVT prophylaxis:  Xarelto   Code Status: Full Code Family Communication: Updated patient extensively at bedside this morning, no family present  Disposition Plan:  Level of care: Telemetry Cardiac Status is: Inpatient  Remains inpatient appropriate because:Ongoing diagnostic testing needed not appropriate for outpatient work up, IV treatments appropriate due to intensity of illness or inability to take PO and Inpatient level of care appropriate due to severity of illness   Dispo: The patient is from: Home              Anticipated d/c is to: Home              Anticipated d/c date is: > 3 days              Patient currently is not medically stable to d/c.   Difficult to place patient No   Consultants:   Cardiology  Procedures:   none  Antimicrobials:   none   Subjective: Patient seen and examined bedside, resting comfortably lying in bed.  Continues with mild shortness of breath, although reports much improved since yesterday.  Patient also believes episode of dizziness may be related to aftereffects of utilizing his home temazepam in which he felt "groggy" throughout the day.  Continues with lower extremity edema.  No other specific complaints or concerns at this time.  Denies headache, no current dizziness, no chest pain, no palpitations, no abdominal pain, no nausea/vomiting/diarrhea, no fever/chills/night sweats.  No acute events overnight per nursing staff.  Objective: Vitals:   08/25/20 0138 08/25/20 0425 08/25/20 0600 08/25/20 0814  BP: 100/68 103/66  104/75  Pulse: 65 64  62  Resp: 16 16  20   Temp: (!) 97.5 F (36.4 C) 97.8 F (36.6 C)  97.8 F (36.6 C)  TempSrc: Oral Oral  Oral  SpO2: 100% 99%  98%  Weight:   74.2 kg   Height:   5\' 10"  (1.778 m)     Intake/Output Summary (Last 24 hours) at 08/25/2020 1014 Last data filed at 08/25/2020 0600 Gross per 24 hour  Intake --  Output 500 ml  Net -500 ml   Filed Weights   08/24/20 1416 08/25/20 0600  Weight: 75.3 kg 74.2 kg     Examination:  General exam: Appears calm and comfortable  Respiratory system: Clear to auscultation. Respiratory effort normal.  Oxygenating well on room air Cardiovascular system: S1 & S2 heard, RRR. No JVD, murmurs, rubs, gallops or clicks.  1-2+ pitting edema bilateral lower extremities, asymmetric with right greater than left in which patient reports normal from his history of knee replacement Gastrointestinal system: Abdomen is nondistended, soft and nontender. No organomegaly or masses felt. Normal bowel sounds heard. Central nervous system: Alert and oriented. No focal neurological deficits. Extremities: Symmetric 5 x 5 power. Skin: No rashes, lesions or ulcers Psychiatry: Judgement and insight appear normal. Mood & affect appropriate.     Data Reviewed: I have personally reviewed following labs and imaging studies  CBC: Recent Labs  Lab 08/24/20 1430 08/25/20 0046  WBC 5.0 6.1  HGB 12.7* 12.4*  HCT 39.0 35.5*  MCV 92.9 89.4  PLT 146* 373*   Basic Metabolic Panel: Recent Labs  Lab 08/23/20 1401 08/24/20 1430 08/25/20 0046  NA 135 136 137  K 3.5 3.7 3.7  CL 97* 99 101  CO2 26 24 25   GLUCOSE 172* 122* 110*  BUN 24* 27* 29*  CREATININE 1.14 1.19 1.26*  CALCIUM 10.1 10.4* 10.3  MG 2.0  --   --    GFR: Estimated Creatinine Clearance: 47.5 mL/min (A) (by C-G formula based on SCr of 1.26 mg/dL (H)). Liver Function Tests: Recent Labs  Lab 08/23/20 1401 08/25/20 0046  AST 39 34  ALT 29 24  ALKPHOS 214* 218*  BILITOT 1.9* 2.0*  PROT 6.2* 6.0*  ALBUMIN 3.6 3.4*   No results for input(s): LIPASE, AMYLASE in the last 168 hours. No results for input(s): AMMONIA in the last 168 hours. Coagulation Profile: No results for input(s): INR, PROTIME in the last 168 hours. Cardiac Enzymes: No results for input(s): CKTOTAL, CKMB, CKMBINDEX, TROPONINI in the last 168 hours. BNP (last 3 results) No results for input(s): PROBNP in the last 8760 hours. HbA1C: No  results for input(s): HGBA1C in the last 72 hours. CBG: No results for input(s): GLUCAP in the last 168 hours. Lipid Profile: No results for input(s): CHOL, HDL, LDLCALC, TRIG, CHOLHDL, LDLDIRECT in the last 72 hours. Thyroid Function Tests: Recent Labs    08/23/20 1427  TSH 5.630*  FREET4 1.11   Anemia Panel: No results for input(s): VITAMINB12, FOLATE, FERRITIN, TIBC, IRON, RETICCTPCT in the last 72 hours. Sepsis Labs: No results for input(s): PROCALCITON, LATICACIDVEN in the last 168 hours.  Recent Results (from the past 240 hour(s))  SARS CORONAVIRUS 2 (TAT 6-24 HRS) Nasopharyngeal Nasopharyngeal Swab     Status: None   Collection Time: 08/17/20  2:04 PM   Specimen: Nasopharyngeal Swab  Result Value Ref Range Status   SARS Coronavirus 2 NEGATIVE NEGATIVE Final    Comment: (NOTE) SARS-CoV-2 target nucleic acids are NOT DETECTED.  The SARS-CoV-2 RNA is generally detectable in upper and lower respiratory specimens during the acute phase of infection. Negative results do not preclude SARS-CoV-2 infection, do not rule out co-infections with other pathogens, and should not be used as the sole basis for treatment or other patient management decisions. Negative results must be combined with clinical observations, patient history, and epidemiological information. The expected result is Negative.  Fact Sheet for Patients: SugarRoll.be  Fact Sheet for Healthcare Providers: https://www.woods-mathews.com/  This test is not yet approved or cleared by the [place] FDA and  has been authorized for detection and/or diagnosis of SARS-CoV-2 by FDA under an Emergency Use Authorization (EUA). This EUA will remain  in effect (meaning this test can be used) for the duration of the COVID-19 declaration under Se ction 564(b)(1) of the Act, 21 U.S.C. section 360bbb-3(b)(1), unless the authorization is terminated or revoked sooner.  Performed at  [facility], [place] [address] [place], [place] [address]   SARS CORONAVIRUS 2 (TAT 6-24 HRS) Nasopharyngeal Nasopharyngeal Swab     Status: None   Collection Time: 08/24/20 10:49 AM   Specimen: Nasopharyngeal Swab  Result Value Ref Range Status   SARS Coronavirus 2 NEGATIVE NEGATIVE Final    Comment: (NOTE) SARS-CoV-2 target nucleic acids are NOT DETECTED.  The SARS-CoV-2 RNA is generally detectable in upper and lower respiratory specimens during the acute phase of infection. Negative results do not preclude SARS-CoV-2 infection, do not rule out co-infections with other pathogens, and should not be used as the sole basis for treatment or other patient management decisions. Negative results must be combined with clinical observations, patient history, and epidemiological information. The expected result is Negative.  Fact Sheet for Patients: SugarRoll.be  Fact Sheet for Healthcare Providers: https://www.woods-mathews.com/  This test is not yet approved or cleared by the [place] FDA and  has been  authorized for detection and/or diagnosis of SARS-CoV-2 by FDA under an Emergency Use Authorization (EUA). This EUA will remain  in effect (meaning this test can be used) for the duration of the COVID-19 declaration under Se ction 564(b)(1) of the Act, 21 U.S.C. section 360bbb-3(b)(1), unless the authorization is terminated or revoked sooner.  Performed at [facility], [place] [address], [place], [place] [address]          Radiology Studies: DG Chest 2 View  Result Date: 08/24/2020 CLINICAL DATA:  Shortness of breath.  Status post fall. EXAM: CHEST - 2 VIEW COMPARISON:  August 06, 2020, March 11, 2020 FINDINGS: The heart size and mediastinal contours are stable. Cardiac pacemaker is unchanged. Mild increased pulmonary interstitium is identified bilaterally unchanged compared to March 11, 2020, chronic. Minimal bilateral  pleural effusions are noted. No focal pneumonia is noted. The visualized skeletal structures are stable. IMPRESSION: No acute cardiopulmonary disease identified. Electronically Signed   By: Abelardo Diesel M.D.   On: 08/24/2020 14:59        Scheduled Meds: . atorvastatin  20 mg Oral QHS  . doxazosin  4 mg Oral Daily  . furosemide  40 mg Intravenous Daily  . metoprolol succinate  25 mg Oral QHS  . montelukast  10 mg Oral Daily  . multivitamin with minerals  1 tablet Oral Daily  . rivaroxaban  20 mg Oral Q supper   Continuous Infusions:   LOS: 1 day    Time spent: 39 minutes spent on chart review, discussion with nursing staff, consultants, updating family and interview/physical exam; more than 50% of that time was spent in counseling and/or coordination of care.    Kipp Shank J [place] DO [facility] Available via Epic secure chat 7am-7pm After these hours, please refer to coverage provider listed on [email or web address] 08/25/2020, 10:14 AM

## 2020-08-25 NOTE — Progress Notes (Signed)
Mobility Specialist - Progress Note   08/25/20 1439  Mobility  Activity Ambulated in hall  Level of Assistance Standby assist, set-up cues, supervision of patient - no hands on  Assistive Device Front wheel walker  Distance Ambulated (ft) 120 ft  Mobility Response Tolerated well  Mobility performed by Mobility specialist  $Mobility charge 1 Mobility   Pt asx throughout ambulation. HR remained in 70s. Pt sitting up in recliner after walk.   Pricilla Handler Mobility Specialist Mobility Specialist Phone: [phone]

## 2020-08-26 LAB — MAGNESIUM: Magnesium: 1.9 mg/dL (ref 1.7–2.4)

## 2020-08-26 LAB — BASIC METABOLIC PANEL
Anion gap: 10 (ref 5–15)
Anion gap: 8 (ref 5–15)
BUN: 31 mg/dL — ABNORMAL HIGH (ref 8–23)
BUN: 29 mg/dL — ABNORMAL HIGH (ref 8–23)
CO2: 26 mmol/L (ref 22–32)
CO2: 26 mmol/L (ref 22–32)
Calcium: 10.1 mg/dL (ref 8.9–10.3)
Calcium: 10.1 mg/dL (ref 8.9–10.3)
Chloride: 98 mmol/L (ref 98–111)
Chloride: 100 mmol/L (ref 98–111)
Creatinine, Ser: 1.5 mg/dL — ABNORMAL HIGH (ref 0.61–1.24)
Creatinine, Ser: 1.31 mg/dL — ABNORMAL HIGH (ref 0.61–1.24)
GFR, Estimated: 46 mL/min — ABNORMAL LOW (ref >60)
GFR, Estimated: 55 mL/min — ABNORMAL LOW (ref >60)
Glucose, Bld: 96 mg/dL (ref 70–99)
Glucose, Bld: 134 mg/dL — ABNORMAL HIGH (ref 70–99)
Potassium: 3.8 mmol/L (ref 3.5–5.1)
Potassium: 4.1 mmol/L (ref 3.5–5.1)
Sodium: 134 mmol/L — ABNORMAL LOW (ref 135–145)
Sodium: 134 mmol/L — ABNORMAL LOW (ref 135–145)

## 2020-08-26 MED ORDER — LORAZEPAM 0.5 MG PO TABS
0.5000 mg | ORAL_TABLET | Freq: Every day | ORAL | Status: DC
  Filled 2020-08-26 (×2): qty 1

## 2020-08-26 MED ORDER — MELATONIN 5 MG PO TABS
5.0000 mg | ORAL_TABLET | Freq: Every day | ORAL | Status: DC
  Administered 2020-08-26: 5 mg via ORAL
  Filled 2020-08-26: qty 1

## 2020-08-26 MED ORDER — POTASSIUM CHLORIDE CRYS ER 20 MEQ PO TBCR
40.0000 meq | EXTENDED_RELEASE_TABLET | Freq: Once | ORAL | Status: AC
  Administered 2020-08-26: 40 meq via ORAL
  Filled 2020-08-26: qty 2

## 2020-08-26 MED ORDER — SODIUM CHLORIDE 0.9 % IV SOLN: 250.0000 mL | INTRAVENOUS | Status: DC | PRN

## 2020-08-26 MED ORDER — SODIUM CHLORIDE 0.9% FLUSH
3.0000 mL | Freq: Two times a day (BID) | INTRAVENOUS | Status: DC
  Administered 2020-08-26 – 2020-08-29 (×7): 3 mL via INTRAVENOUS

## 2020-08-26 MED ORDER — MAGNESIUM SULFATE 2 GM/50ML IV SOLN
2.0000 g | Freq: Once | INTRAVENOUS | Status: AC
  Administered 2020-08-26: 2 g via INTRAVENOUS
  Filled 2020-08-26: qty 50

## 2020-08-26 MED ORDER — DOFETILIDE 250 MCG PO CAPS
250.0000 ug | ORAL_CAPSULE | Freq: Two times a day (BID) | ORAL | Status: DC
  Administered 2020-08-26 – 2020-08-29 (×6): 250 ug via ORAL
  Filled 2020-08-26 (×7): qty 1

## 2020-08-26 MED ORDER — SODIUM CHLORIDE 0.9% FLUSH: 3.0000 mL | INTRAVENOUS | Status: DC | PRN

## 2020-08-26 MED ORDER — FUROSEMIDE 10 MG/ML IJ SOLN
80.0000 mg | Freq: Once | INTRAMUSCULAR | Status: AC
  Administered 2020-08-26: 80 mg via INTRAVENOUS
  Filled 2020-08-26: qty 8

## 2020-08-26 NOTE — Progress Notes (Signed)
OT Cancellation Note  Patient Details Name: Alexander Murray, Dr. MRN: [id number] DOB: September 17, 1940   Cancelled Treatment:    Reason Eval/Treat Not Completed: Patient declined, no reason specified;OT screened, no needs identified, will sign off (Pt and pt's spouse in room. 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 Both parties stating that pt is in good physical condition and has walked up and down the hallway with his spouse. Pt currently, able to manage ADL and spouse can assist at home as needed. Pt and spouse politely declining OT eval)   Jefferey Pica, OTR/L Acute Rehabilitation Services Pager: [phone] Office: [phone]   Jefferey Pica 08/26/2020, 12:52 PM

## 2020-08-26 NOTE — TOC Benefit Eligibility Note (Signed)
Transition of Care [facility]) Benefit Eligibility Note    Patient Details  Name: Timber Lucarelli, Dr. MRN: [id number] Date of Birth: 1941-05-31   Medication/Dose: Dofetilide (Tikoysn) 500 mcg and 271mcg bid 30 day supply  Covered?: Yes  Tier: 3 Drug  Prescription Coverage [facility]erred [facility]: [facility]  Spoke with Person/Company/Phone Number:: Christy H. W/[facility] [employer]Pharmacy  Co-Pay: $45.69 and $44.43  Prior Approval: No  Deductible: Unmet       Shelda Altes Phone Number: 08/26/2020, 11:22 AM

## 2020-08-26 NOTE — Progress Notes (Signed)
Mobility Specialist - Progress Note   08/26/20 1457  Mobility  Activity Ambulated in hall  Level of Assistance Standby assist, set-up cues, supervision of patient - no hands on  Assistive Device Front wheel walker  Distance Ambulated (ft) 250 ft  Mobility Response Tolerated well  Mobility performed by Mobility specialist  $Mobility charge 1 Mobility   Pre-mobility: 62 HR During mobility: 71 HR Post-mobility: 63 HR  Pt received in bathroom, stating he just had some diarrhea. Pt limited distance due to fatigue he attributed to the diarrhea and having to get up to urinate a lot since being started on Lasix. He was otherwise asx. Pt sitting up in recliner after walk, wife in room.   Pricilla Handler Mobility Specialist Mobility Specialist Phone: [phone]

## 2020-08-26 NOTE — Consult Note (Addendum)
ELECTROPHYSIOLOGY CONSULT NOTE    Patient ID: Alexander Murray, Dr. MRN: [id number], DOB/AGE: [birth year] 82 y.o.  Admit date: 08/24/2020 Date of Consult: 08/26/2020  Primary Physician: [name], MD Primary Cardiologist: No primary care provider on file.  Electrophysiologist: Dr. Caryl Comes  Referring Provider: Dr. [place]  Patient Profile: Alexander Murray is a 80 y.o. 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 The patient previously had subclinical AF detected on his device but became persistent and was started on Xarelto for a CHADS2VASC score of 6. He was not able to undergo TEE guided DCCV 2/2 his esophageal strictures. After 3 weeks of anticoagulation he underwent DCCV on 07/29/20. Echo showed preserved EF and he was started on flecainide.  Followed up in AF clinic 08/23/20 s/p DCCV on 08/19/20. Patient reported that he had abdominal and scrotal swelling and SOB since the procedure. His weight as stayed the same despite increased diuretics. Unfortunately, he was also back in AF. Discussed with Dr. Caryl Comes per notes and flecainide stopped in anticipation for Tikosyn admission this week.   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   He called the answering service 1/22 with dizziness and SOB (including with conversation).   Pertinent labs on admission include K 3.7, Cr 1.19, WBC 5.0, Hgb 12.7, Plts 146. COVID negative. CXR with no acute abnormality.   He is having trouble sleeping, and feels like "If I could just get back in to normal rhythm I would feel so much better". Has SOB with bathing and changing clothes. Could walk the halls yesterday slowly.  He denies dizziness, but specifies he is "slow and careful". He also complains of early  satiety.    Past Medical History:  Diagnosis Date  . Allergy   . Arthritis    "right knee and right thumb" (12/19/2014)  . Asthma    pt denies  . Bronchitis 07/2016   started in December and continued about 2 months  . Esophageal stricture   . GERD (gastroesophageal reflux disease)   . Hemochromatosis   . Hiatal hernia   . Hypercalcemia   . Hypercholesteremia   . Hypertension   . Internal hemorrhoids   . Lung cancer ([place])   . Osteoarthritis   . Palpitations   . Presence of permanent cardiac pacemaker    hx bradycardia  . Retinal artery occlusion, branch    "right eye"  . Second degree AV block, Mobitz type II   . Skin cancer    right shoulder  . Stroke [facility]) ~ 2011   "right eye"/ partial blindness  . Tubular adenoma of colon      Surgical History:  Past Surgical History:  Procedure Laterality Date  . CARDIOVERSION N/A 07/29/2020   Procedure: CARDIOVERSION;  Surgeon: Jerline Pain, MD;  Location: [place];  Service: Cardiovascular;  Laterality: N/A;  NEEDS RAPID COVID TEST MORNING OF  . CARDIOVERSION N/A 08/19/2020   Procedure: CARDIOVERSION;  Surgeon: Thayer Headings, MD;  Location: [place];  Service: Cardiovascular;  Laterality: N/A;  . CATARACT EXTRACTION, BILATERAL  01/2017  . COLONOSCOPY    . EP IMPLANTABLE DEVICE N/A 12/19/2014   Procedure: Pacemaker Implant;  Surgeon: Deboraha Sprang, MD;  Location: [place] [facility];  Service: Cardiovascular;  Laterality: N/A;  .  ESOPHAGOGASTRODUODENOSCOPY (EGD) WITH ESOPHAGEAL DILATION  X 3  . EYE SURGERY Bilateral 2017  . INGUINAL HERNIA REPAIR Left 1980's  . INSERT / REPLACE / REMOVE PACEMAKER  12/19/2014  . INTERCOSTAL NERVE BLOCK Right 03/11/2020   Procedure: INTERCOSTAL NERVE BLOCK;  Surgeon: Grace Isaac, MD;  Location: [place];  Service: Thoracic;  Laterality: Right;  . JOINT REPLACEMENT Right 2016  . KNEE ARTHROSCOPY Right ~ 1982; ~ 1992  . MOHS SURGERY Right ~ 2014   "pre-melanoma scapula"  . POSTERIOR  TIBIAL TENDON REPAIR Left 2012  . TONSILLECTOMY  1946  . TOTAL KNEE ARTHROPLASTY Right 05/20/2015   Procedure: RIGHT TOTAL KNEE ARTHROPLASTY;  Surgeon: Paralee Cancel, MD;  Location: [facility];  Service: Orthopedics;  Laterality: Right;  . UPPER GASTROINTESTINAL ENDOSCOPY    . VIDEO BRONCHOSCOPY N/A 03/11/2020   Procedure: VIDEO BRONCHOSCOPY;  Surgeon: Grace Isaac, MD;  Location: [place];  Service: Thoracic;  Laterality: N/A;  . XI ROBOTIC ASSISTED THORACOSCOPY- SEGMENTECTOMY Right 03/11/2020   Procedure: XI ROBOTIC ASSISTED THORACOSCOPY-RIGHT SUPERIOR SEGMENTECTOMY WITH NODE SAMPLES;  Surgeon: Grace Isaac, MD;  Location: [place];  Service: Thoracic;  Laterality: Right;     Medications Prior to Admission  Medication Sig Dispense Refill Last Dose  . acetaminophen (TYLENOL) 500 MG tablet Take 500-1,000 mg by mouth every 6 (six) hours as needed (for pain).   Past Week at Unknown time  . atorvastatin (LIPITOR) 20 MG tablet Take 1 tablet (20 mg total) by mouth daily. (Patient taking differently: Take 20 mg by mouth at bedtime.) 30 tablet 3 08/23/2020 at pm  . celecoxib (CELEBREX) 200 MG capsule Take 1 capsule (200 mg total) by mouth 2 (two) times daily as needed for mild pain. 10 capsule 0 Past Month at Unknown time  . diazepam (VALIUM) 5 MG tablet Take 5 mg by mouth at bedtime as needed (for sleep).   unk  . doxazosin (CARDURA) 4 MG tablet Take 4 mg by mouth daily.    08/24/2020 at am  . esomeprazole (NEXIUM) 20 MG capsule Take 20 mg by mouth daily before breakfast.     . fluticasone (FLONASE) 50 MCG/ACT nasal spray Place 1 spray into both nostrils daily as needed for allergies or rhinitis.   unk  . Glycerin-Hypromellose-PEG 400 (DRY EYE RELIEF DROPS) 0.2-0.2-1 % SOLN Place 1 drop into both eyes daily as needed (for dryness). Saline     . HYDROcodone-homatropine (HYCODAN) 5-1.5 MG/5ML syrup Take 5 mLs by mouth every 6 (six) hours as needed for cough.     . melatonin 3 MG TABS tablet Take 3 mg by  mouth at bedtime as needed (for sleep).   08/23/2020 at pm  . metoprolol succinate (TOPROL XL) 25 MG 24 hr tablet Take 1 tablet (25 mg total) by mouth at bedtime. 30 tablet 3 08/23/2020 at 2000  . montelukast (SINGULAIR) 10 MG tablet Take 10 mg by mouth every morning.    08/24/2020 at am  . Multiple Vitamin (MULTIVITAMIN) capsule Take 1 capsule by mouth daily.   08/24/2020 at Unknown time  . Multiple Vitamins-Minerals (PRESERVISION AREDS 2 PO) Take 1 capsule by mouth in the morning and at bedtime.    08/24/2020 at am  . potassium chloride SA (KLOR-CON) 20 MEQ tablet Take 3 tablets (60 mEq total) by mouth daily. 90 tablet 6 08/23/2020 at Unknown time  . rivaroxaban (XARELTO) 20 MG TABS tablet Take 1 tablet (20 mg total) by mouth daily with supper. 30 tablet 11 08/23/2020 at  2000  . temazepam (RESTORIL) 15 MG capsule Take 15 mg by mouth at bedtime as needed for sleep.   08/23/2020 at pm  . torsemide (DEMADEX) 20 MG tablet Take 2 tablets (40 mg total) by mouth daily. 60 tablet 2 08/24/2020 at am  . vitamin C (ASCORBIC ACID) 500 MG tablet Take 500 mg by mouth at bedtime.    08/23/2020 at Unknown time  . acetaminophen (TYLENOL) 325 MG tablet Take 2 tablets (650 mg total) by mouth every 6 (six) hours as needed for mild pain or moderate pain. (Patient not taking: Reported on 08/24/2020)   Not Taking at Unknown time  . Misc Natural Products (GLUCOSAMINE CHONDROITIN ADV PO) Take 1,500 mg by mouth at bedtime.    on hold    Inpatient Medications:  . atorvastatin  20 mg Oral QHS  . doxazosin  4 mg Oral Daily  . metoprolol succinate  25 mg Oral QHS  . montelukast  10 mg Oral Daily  . multivitamin with minerals  1 tablet Oral Daily  . rivaroxaban  20 mg Oral Q supper    Allergies:  Allergies  Allergen Reactions  . Lisinopril Palpitations and Other (See Comments)    Dizziness, also    Social History   Socioeconomic History  . Marital status: Married    Spouse name: Not on file  . Number of children: Not on  file  . Years of education: Not on file  . Highest education level: Not on file  Occupational History  . Occupation: retired - Doctor, general practice  Tobacco Use  . Smoking status: Former Smoker    Years: 3.00    Types: Cigarettes    Quit date: 05/16/1962    Years since quitting: 58.3  . Smokeless tobacco: Never Used  . Tobacco comment: "quit smoking in the 1960's"; was an intermittent smoker   Vaping Use  . Vaping Use: Never used  Substance and Sexual Activity  . Alcohol use: No    Comment: 12/19/2014 "stopped drinking in ~ 2012"  . Drug use: No  . Sexual activity: Yes  Other Topics Concern  . Not on file  Social History Narrative  . Not on file   Social Determinants of Health   Financial Resource Strain: Not on file  Food Insecurity: Not on file  Transportation Needs: Not on file  Physical Activity: Not on file  Stress: Not on file  Social Connections: Not on file  Intimate Partner Violence: Not on file     Family History  Problem Relation Age of Onset  . Colon cancer Mother        dx in her 38s  . Heart disease Mother   . Alzheimer's disease Mother   . Colon cancer Father        dx in his early 29s  . Heart disease Father   . Prostate cancer Father   . Prostate cancer Brother        1/2 brother  . Hemochromatosis Brother   . Prostate cancer Brother   . Esophageal cancer Neg Hx   . Pancreatic cancer Neg Hx   . Stomach cancer Neg Hx   . Liver disease Neg Hx      Review of Systems: All other systems reviewed and are otherwise negative except as noted above.  Physical Exam: Vitals:   08/25/20 1753 08/25/20 2049 08/26/20 0001 08/26/20 0416  BP: 133/89 112/65 107/80 (!) 133/99  Pulse: 65 61 75 72  Resp: 16 (!) 25 17 14  Temp: 98.3 F (36.8 C) 97.6 F (36.4 C) 98.2 F (36.8 C) 97.6 F (36.4 C)  TempSrc: Oral Oral Oral Oral  SpO2: 98% 96% 93% 97%  Weight:    74.4 kg  Height:    5\' 10"  (1.778 m)    GEN- The patient is fatigued appearing, alert and  oriented x 3 today.   HEENT: normocephalic, atraumatic; sclera clear, conjunctiva pink; hearing intact; oropharynx clear; neck supple Lungs- Clear to ausculation bilaterally, normal work of breathing.  No wheezes, rales, rhonchi Heart- Regular rate and rhythm, no murmurs, rubs or gallops GI- soft, non-tender, non-distended, bowel sounds present Extremities- no clubbing or cyanosis; DP/PT/radial pulses 2+ bilaterally. 1+ soft edema 1/3 way to knees. MS- no significant deformity or atrophy Skin- warm and dry, no rash or lesion Psych- euthymic mood, flat but appropriate affect Neuro- strength and sensation are intact  Labs:   Lab Results  Component Value Date   WBC 6.1 08/25/2020   HGB 12.4 (L) 08/25/2020   HCT 35.5 (L) 08/25/2020   MCV 89.4 08/25/2020   PLT 130 (L) 08/25/2020    Recent Labs  Lab 08/25/20 0046 08/26/20 0128  NA 137 134*  K 3.7 3.8  CL 101 98  CO2 25 26  BUN 29* 31*  CREATININE 1.26* 1.50*  CALCIUM 10.3 10.1  PROT 6.0*  --   BILITOT 2.0*  --   ALKPHOS 218*  --   ALT 24  --   AST 34  --   GLUCOSE 110* 96      Radiology/Studies: DG Chest 2 View  Result Date: 08/24/2020 CLINICAL DATA:  Shortness of breath.  Status post fall. EXAM: CHEST - 2 VIEW COMPARISON:  August 06, 2020, March 11, 2020 FINDINGS: The heart size and mediastinal contours are stable. Cardiac pacemaker is unchanged. Mild increased pulmonary interstitium is identified bilaterally unchanged compared to March 11, 2020, chronic. Minimal bilateral pleural effusions are noted. No focal pneumonia is noted. The visualized skeletal structures are stable. IMPRESSION: No acute cardiopulmonary disease identified. Electronically Signed   By: Abelardo Diesel M.D.   On: 08/24/2020 14:59   DG Chest 2 View  Result Date: 08/06/2020 CLINICAL DATA:  Shortness of breath EXAM: CHEST - 2 VIEW COMPARISON:  06/26/2020, CT chest 06/26/2020 FINDINGS: Left-sided pacing device as before. No focal opacity or pleural effusion.  Stable cardiomediastinal silhouette with aortic atherosclerosis. Chronic elevation right diaphragm. Pericardial calcifications as before. No pneumothorax. IMPRESSION: No active cardiopulmonary disease. Electronically Signed   By: Donavan Foil M.D.   On: 08/06/2020 15:30   ECHOCARDIOGRAM COMPLETE  Result Date: 08/14/2020    ECHOCARDIOGRAM REPORT   Patient Name:   KIREN MCISAAC Date of Exam: 08/14/2020 Medical Rec #:  [id number]      Height:       70.0 in Accession #:    [id number]     Weight:       170.0 lb Date of Birth:  09/10/1940       BSA:          1.948 m Patient Age:    38 years       BP:           118/82 mmHg Patient Gender: M              HR:           82 bpm. Exam Location:  Inpatient Procedure: 2D Echo, Cardiac Doppler and Color Doppler Indications:    Atrial Fibrillation I48.91  History:  Patient has prior history of Echocardiogram examinations, most                 recent 12/10/2014. Pacemaker, Stroke; Risk Factors:Former Smoker                 and Hypertension. GERD.  Sonographer:    Vickie Epley RDCS Referring Phys: [id number] [place]  1. Left ventricular ejection fraction, by estimation, is 60 to 65%. The left ventricle has normal function. Left ventricular endocardial border not optimally defined to evaluate regional wall motion. Left ventricular diastolic parameters are indeterminate.  2. Right ventricular systolic function is mildly reduced. The right ventricular size is mildly enlarged. There is normal pulmonary artery systolic pressure. The estimated right ventricular systolic pressure is 75.1 mmHg.  3. Left atrial size was moderately dilated.  4. Right atrial size was mild to moderately dilated.  5. The mitral valve is degenerative. Trivial mitral valve regurgitation. No evidence of mitral stenosis. Moderate mitral annular calcification.  6. The aortic valve is abnormal. There is moderate calcification of the aortic valve with fixed left coronary cusp and relatively  preserved motion of the non-coronary cusp and right coronary cusp. Aortic valve regurgitation is not visualized. Moderate aortic valve sclerosis/calcification is present, without any evidence of aortic stenosis, however Doppler interrogation only performed in one location.  7. Aortic dilatation noted. There is mild to moderate dilatation of the ascending aorta, measuring 44 mm.  8. The inferior vena cava is dilated in size with <50% respiratory variability, suggesting right atrial pressure of 15 mmHg. FINDINGS  Left Ventricle: Left ventricular ejection fraction, by estimation, is 60 to 65%. The left ventricle has normal function. Left ventricular endocardial border not optimally defined to evaluate regional wall motion. The left ventricular internal cavity size was normal in size. There is no left ventricular hypertrophy. Left ventricular diastolic parameters are indeterminate. Right Ventricle: The right ventricular size is mildly enlarged. No increase in right ventricular wall thickness. Right ventricular systolic function is mildly reduced. There is normal pulmonary artery systolic pressure. The tricuspid regurgitant velocity  is 2.09 m/s, and with an assumed right atrial pressure of 15 mmHg, the estimated right ventricular systolic pressure is 02.5 mmHg. Left Atrium: Left atrial size was moderately dilated. Volumes are underestimated. Right Atrium: Right atrial size was mild to moderately dilated. Pericardium: There is no evidence of pericardial effusion. Presence of pericardial fat pad. Mitral Valve: The mitral valve is degenerative in appearance. Moderate mitral annular calcification. Trivial mitral valve regurgitation. No evidence of mitral valve stenosis. Tricuspid Valve: The tricuspid valve is normal in structure. Tricuspid valve regurgitation is trivial. No evidence of tricuspid stenosis. Aortic Valve: The aortic valve is abnormal. There is moderate calcification of the aortic valve. Aortic valve  regurgitation is not visualized. Mild to moderate aortic valve sclerosis/calcification is present, without any evidence of aortic stenosis. Pulmonic Valve: The pulmonic valve was not well visualized. Pulmonic valve regurgitation is trivial. No evidence of pulmonic stenosis. Aorta: Aortic dilatation noted. There is mild to moderate dilatation of the ascending aorta, measuring 44 mm. Venous: The inferior vena cava is dilated in size with less than 50% respiratory variability, suggesting right atrial pressure of 15 mmHg. IAS/Shunts: The interatrial septum was not well visualized. Additional Comments: A pacer wire is visualized in the right ventricle.  LEFT VENTRICLE PLAX 2D LVIDd:         4.10 cm LVIDs:         3.20 cm LV PW:  0.90 cm LV IVS:        0.90 cm LVOT diam:     2.30 cm LV SV:         51 LV SV Index:   26 LVOT Area:     4.15 cm  RIGHT VENTRICLE TAPSE (M-mode): 0.7 cm LEFT ATRIUM           Index       RIGHT ATRIUM           Index LA diam:      4.90 cm 2.52 cm/m  RA Area:     22.80 cm LA Vol (A2C): 35.6 ml 18.28 ml/m RA Volume:   68.60 ml  35.22 ml/m LA Vol (A4C): 56.0 ml 28.75 ml/m  AORTIC VALVE LVOT Vmax:   60.40 cm/s LVOT Vmean:  41.400 cm/s LVOT VTI:    0.123 m  AORTA Ao Root diam: 3.60 cm Ao Asc diam:  4.40 cm TRICUSPID VALVE TR Peak grad:   17.5 mmHg TR Vmax:        209.00 cm/s  SHUNTS Systemic VTI:  0.12 m Systemic Diam: 2.30 cm Cherlynn Kaiser MD Electronically signed by Cherlynn Kaiser MD Signature Date/Time: 08/14/2020/9:36:10 AM    Final     EKG: 08/24/20 shows V paced rhythm at 61 bpm with underlying AF (personally reviewed)  TELEMETRY: V paced 60s, underlying AF (personally reviewed)  DEVICE HISTORY: MedtronicDual ChamberPPM implanted 2126for symptomatic high grade heart block  Echo 08/14/20 LVEF 60-65%, stable aortic root dilatation.   Assessment/Plan: 1. Persistent Atrial Fibrillation (ICD10:  I48.19) Flecainide stopped 08/23/20 due to recurrent AF Had planned admission  for dofetilide tomorrow.  The patient's CHA2DS2-VASc score is 6, indicating a 9.7% annual risk of stroke.   S/p DCCV 08/19/20, He was back in AF on visit 1/21 and remains in AF currently by tele.  QT in SR 420-430 when corrected for V pacing. ( paced QRS ~170) Patient has checked on price of medication and will use [facility]. Will send medications to pharmacy for review.  Continue Toprol 25 mg daily. Stop ASA Continue Xarelto 20 mg daily, he has not missed any doses. (Eliquis not covered by patient insurance) K 3.8, Mg 1.9 this am. Will gently supp both for consideration of starting Tikosyn today. CrCl ~ 39 today with AKI. Baseline Closer to 53 mL/min, so only qualifies for 250 mcg dose even at baseline.  2. Acute on chronic diastolic CHF Patient's weight remains up with symptoms of edema in lower extremities, abdomen, and scrotum.  Change Lasix to torsemide 40 mg daily Patient has only been taking KCL 20 meq daily. Increase to 60 meq daily. Check Cmet as above.  3. HTN Stable, med changes as above.  4. Second degree AV block type II S/p PPM, followed by Dr Caryl Comes  Device stable.  5. Small cell lung cancer Currently undergoing chemotherapy.  Plans per oncology.   6. AKI  Noted with diuresis (2 doses of 40 mg IV lasix thus far) Cr 1.19 -> 1.26 -> 1.50. IV lasix held.  Only negative 440 mL recorded so far. Estimated Creatinine Clearance: 39.9 mL/min (A) (by C-G formula based on SCr of 1.5 mg/dL (H)).    For questions or updates, please contact [place] Please consult [email or web address] for contact info under Cardiology/STEMI.  Signed, Shirley Friar, PA-C  08/26/2020 7:16 AM  Atrial fib persistent, recurrent despite DCCV  PVC mod (device interrogation 12/21 est 1.4% but visually much higher)  CHF A/C Diastolic BNP elevated  DOE  Small cell lung cancer, s/p right superior segmentectomy  Abnormal PFTs-preop>  Decreased lung volumes, mild interstitial disease,  mod-severe diffusion defect  Renal insufficiency A/C class 3  Complete/high grade heart block  Pacemaker Medtronic    Symptoms are worse in afib, and I suspect that with the multiple contributors to respiratory funciton, HFpEF, LUNG issues as outlined, that the afib is the trigger over the edge.  Conversations last week landed on dofetilide which is likely the most efficacious option other than amio which would be of concern given concomitant lung disease  His respiratory status however is mostly been worsened with afib and unrelated to the post op status or even chemotherapy  Struggling with diuresis, did not respond to torsemide or furosemide 40 IV  Will increase.  With orthopnea and elevated BNP not likely something else, ie PE esp given near normal RV function  And is anticoagulation   K is (relatively) low so will replete and then begin (est QTc 12/27 post DCCV is about 430 ( 500- 70 msec given QRSd of 170 msec)  Discussed the risks and benefits of dofetilide and the plan for DCCV on Wed and discharge on Thursday if ready

## 2020-08-26 NOTE — Progress Notes (Signed)
Applied TED hose on pt's leg per order.   Lavenia Atlas, RN

## 2020-08-26 NOTE — Evaluation (Signed)
Physical Therapy Evaluation Patient Details Name: Alexander Murray, Dr. 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    Clinical Impression  Pt admitted with above diagnosis. PTA pt resided at [facility] with his wife. He was independent mobility and ADLs. On eval, he required min guard assist transfers and ambulation 400' with RW. Per pt request, pt provided with orange theraband and instructed in UE HEP. Pt currently with functional limitations due to the deficits listed below. Pt will benefit from skilled PT to increase their independence and safety with mobility to allow discharge home. PT to follow acutely. No follow up services indicated. Pt is a retired Doctor, general practice and pt's wife is a retired [employer] manager. They feel comfortable managing all mobility needs upon discharge.      Follow Up Recommendations No PT follow up;Supervision/Assistance - 24 hour    Equipment Recommendations  None recommended by PT    Recommendations for Other Services       Precautions / Restrictions Precautions Precautions: Fall      Mobility  Bed Mobility               General bed mobility comments: Pt received in recliner.    Transfers Overall transfer level: Needs assistance Equipment used: Rolling walker (2 wheeled) Transfers: Sit to/from Stand Sit to Stand: Min guard         General transfer comment: increased time  Ambulation/Gait Ambulation/Gait assistance: Min guard Gait Distance (Feet): 400 Feet Assistive device: Rolling  walker (2 wheeled) Gait Pattern/deviations: Step-through pattern;Decreased stride length Gait velocity: decreased Gait velocity interpretation: <1.31 ft/sec, indicative of household ambulator General Gait Details: steady gait with RW  Stairs            Wheelchair Mobility    Modified Rankin (Stroke Patients Only)       Balance Overall balance assessment: Needs assistance Sitting-balance support: No upper extremity supported;Feet supported Sitting balance-Leahy Scale: Fair     Standing balance support: Bilateral upper extremity supported;During functional activity Standing balance-Leahy Scale: Poor Standing balance comment: reliant on BUE support                             Pertinent Vitals/Pain Pain Assessment: No/denies pain    Home Living Family/patient expects to be discharged to:: Private residence Living Arrangements: Spouse/significant other Available Help at Discharge: Family;Available 24 hours/day Type of Home: Independent living facility [facility]) Home Access: Level entry     Home Layout: One level Home Equipment: Shower seat - built in;Cane - single point;Grab bars - tub/shower;Grab bars - toilet;Hand held shower head Additional Comments: Pt/wife report they are able to borrow RW from [place], as needed.    Prior Function Level of Independence: Needs assistance   Gait / Transfers Assistance Needed: occasional use of SPC  ADL's / Homemaking Assistance Needed: occasional assist provided by wife        Hand Dominance        Extremity/Trunk Assessment   Upper Extremity Assessment Upper Extremity Assessment: Generalized weakness    Lower Extremity Assessment Lower Extremity Assessment: Generalized weakness  Cervical / Trunk Assessment Cervical / Trunk Assessment: Kyphotic  Communication   Communication: No difficulties  Cognition Arousal/Alertness: Awake/alert Behavior During Therapy: WFL for tasks  assessed/performed Overall Cognitive Status: Within Functional Limits for tasks assessed                                        General Comments      Exercises     Assessment/Plan    PT Assessment Patient needs continued PT services  PT Problem List Decreased strength;Decreased mobility;Decreased balance;Decreased activity tolerance       PT Treatment Interventions Therapeutic activities;DME instruction;Gait training;Therapeutic exercise;Patient/family education;Balance training;Functional mobility training    PT Goals (Current goals can be found in the Care Plan section)  Acute Rehab PT Goals Patient Stated Goal: to play golf in [place] in a couple weeks PT Goal Formulation: With patient/family Time For Goal Achievement: 09/09/20 Potential to Achieve Goals: Good    Frequency Min 3X/week   Barriers to discharge        Co-evaluation               AM-PAC PT "6 Clicks" Mobility  Outcome Measure Help needed turning from your back to your side while in a flat bed without using bedrails?: A Little Help needed moving from lying on your back to sitting on the side of a flat bed without using bedrails?: A Little Help needed moving to and from a bed to a chair (including a wheelchair)?: A Little Help needed standing up from a chair using your arms (e.g., wheelchair or bedside chair)?: A Little Help needed to walk in hospital room?: A Little Help needed climbing 3-5 steps with a railing? : A Little 6 Click Score: 18    End of Session Equipment Utilized During Treatment: Gait belt Activity Tolerance: Patient tolerated treatment well Patient left: in chair;with call bell/phone within reach;with family/visitor present Nurse Communication: Mobility status PT Visit Diagnosis: Muscle weakness (generalized) (M62.81);Difficulty in walking, not elsewhere classified (R26.2)    Time: 9357-0177 PT Time Calculation (min) (ACUTE ONLY): 23 min   Charges:   PT  Evaluation $PT Eval Moderate Complexity: 1 Mod PT Treatments $Gait Training: 8-22 mins        Alexander Murray, PT  Office # [phone] Pager [phone]   Alexander Murray 08/26/2020, 12:43 PM

## 2020-08-26 NOTE — Progress Notes (Signed)
Obtained  EKG and placed in pt's chart.   Lavenia Atlas, RN

## 2020-08-26 NOTE — Progress Notes (Signed)
Pharmacy: Dofetilide (Tikosyn) - Initial Consult Assessment and Electrolyte Replacement  Pharmacy consulted to assist in monitoring and replacing electrolytes in this 80 y.o. male admitted on 08/24/2020 undergoing dofetilide initiation. First dofetilide dose: 8pm 1/24 dofetilide 291mcg bid.   Assessment:  Patient Exclusion Criteria: If any screening criteria checked as "Yes", then  patient  should NOT receive dofetilide until criteria item is corrected.  If "Yes" please indicate correction plan.  YES  NO Patient  Exclusion Criteria Correction Plan   []   [x]   Baseline QTc interval is greater than or equal to 440 msec. IF above YES box checked dofetilide contraindicated unless patient has ICD; then may proceed if QTc 500-550 msec or with known ventricular conduction abnormalities may proceed with QTc 550-600 msec. QTc = 0.5    []   [x]   Patient is known or suspected to have a digoxin level greater than 2 ng/ml: No results found for: DIGOXIN     []   [x]   Creatinine clearance less than 20 ml/min (calculated using Cockcroft-Gault, actual body weight and serum creatinine): Estimated Creatinine Clearance: 39.9 mL/min (A) (by C-G formula based on SCr of 1.5 mg/dL (H)).     []   [x]  Patient has received drugs known to prolong the QT intervals within the last 48 hours (phenothiazines, tricyclics or tetracyclic antidepressants, erythromycin, H-1 antihistamines, cisapride, fluoroquinolones, azithromycin, ondansetron).   Updated information on QT prolonging agents is available to be searched on the following database:QT prolonging agents     []   [x]   Patient received a dose of hydrochlorothiazide (Oretic) alone or in any combination including triamterene (Dyazide, Maxzide) in the last 48 hours.    []   [x]  Patient received a medication known to increase dofetilide plasma concentrations prior to initial dofetilide dose:  . Trimethoprim (Primsol, Proloprim) in the last 36 hours . Verapamil  (Calan, Verelan) in the last 36 hours or a sustained release dose in the last 72 hours . Megestrol (Megace) in the last 5 days  . Cimetidine (Tagamet) in the last 6 hours . Ketoconazole (Nizoral) in the last 24 hours . Itraconazole (Sporanox) in the last 48 hours  . Prochlorperazine (Compazine) in the last 36 hours     []   [x]   Patient is known to have a history of torsades de pointes; congenital or acquired long QT syndromes.    []   [x]   Patient has received a Class 1 antiarrhythmic with less than 2 half-lives since last dose. (Disopyramide, Quinidine, Procainamide, Lidocaine, Mexiletine, Flecainide, Propafenone)    []   [x]   Patient has received amiodarone therapy in the past 3 months or amiodarone level is greater than 0.3 ng/ml.    Patient has been appropriately anticoagulated with rivaroxaban 20mg  daily.   Labs:    Component Value Date/Time   K 3.8 08/26/2020 0128   MG 1.9 08/26/2020 0128     Plan: Potassium: K 3.8-3.9:  Hold Tikosyn initiation and give KCl 40 mEq po x1 then begin Tikosyn at least 2hr after KCl dose - do not need to recheck K   Magnesium: Mg 1.8-2: Give Mg 2 gm IV x1 to prevent Mg from dropping below 1.8 - do not need to recheck Mg. Appropriate to initiate Lafferty.D. CPP, BCPS Clinical Pharmacist [phone] 08/26/2020 12:30 PM

## 2020-08-26 NOTE — Progress Notes (Signed)
PROGRESS NOTE    Alexander Murray, Dr.  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  Symptoms of dyspnea have been progressing over the last 2-3 days with increased lower extremity edema. Also, patient reports decreased appetite given that he feels full with some abdominal distention which he relates to fluid overload.  Additionally, patient reports that he fell in his bathroom after feeling dizzy but denies any loss of consciousness or syncopal episode.    Recent cardioversions, last being 5 days ago for his atrial fibrillation.  Symptoms worse with exertion and improved with rest.  Recently had medication changes by cardiology with discontinuation of Cardizem and furosemide and started torsemide and metoprolol.  Patient reports that he does not believe he has had adequate urine output with the torsemide.  He was also instructed recently to stop his flecainide with planned transition to Tikosyn.  Patient reports last dose of flecainide was the morning of 08/23/2020.  In the ED, temperature 97.6, HR 58-73, RR 12, BP 105/73, SPO2 99% on room air.  Sodium 136, potassium 3.7, chloride 99, CO2 24, glucose 122, BUN 27, creatinine 1.19.  WBC 5.0, hemoglobin 12.7, platelets 146.  SARS-CoV-2/COVID-19 negative.  CXR with no acute cardiopulmonary disease identified.  EKG notable for V paced rhythm, rate 61.  Cardiology was consulted by EDP for admission, deferred to hospitalist admission and Dr. Radford Pax stated EP will follow  patient in the a.m.   Assessment & Plan:   Active Problems:   S/P partial lobectomy of lung   Small cell lung cancer, right lower lobe ([name])   Persistent atrial fibrillation ([name])   CHF (congestive heart failure) ([name])   Acute on chronic diastolic congestive heart failure Patient presenting with progressive shortness of breath, recent changes by cardiology clinic with transition of furosemide to torsemide.  Patient continues with lower extremity edema.  Elevated BNP of 976.  Chest x-ray with no acute cardiopulmonary disease process.  --Cardiology consulted, Dr. Radford Pax recommends EP evaluation --net negative 193mL past 24h and net negative 440 mLsince admission --wt 166>163.5>164 lbs --Holding diuretics with bump in creatinine to 1.50 today --Strict I's and O's and daily weights --Follow BMP daily  Permanent atrial fibrillation Patient follows with cardiology/EP Dr. Caryl Comes outpatient.  Recent cardioversion 08/19/2020.  Patient was previously on flecainide, which has been discontinued with last dose reported on 08/23/2020 with planned initiation of Tikosyn.  EKG with V paced rhythm, rate 61.  TSH 5.630 with a free T4 of 1.11. --Cardiology/EP following --Continue metoprolol succinate 25 mg p.o. daily --Xarelto 20 mg p.o. daily --Cardiology plans to start Tikosyn 250mg  PO BID this evening  HLD: Continue statin  Hx essential hypertension: Patient with recent discontinuation of Cardizem.  Patient reports that his blood pressures have been low normal as of recent, he relates it to his diagnosis of atrial fibrillation. --Continue metoprolol succinate 25 mg p.o. daily  Small cell lung cancer Patient follows with medical oncology, Dr. Earlie Server.  Reports completed chemotherapy in November 2021.  Continue outpatient follow-up with oncology.  Insomnia Patient reports "groggy feeling" with temazepam which may have caused some  of his symptoms regards to dizziness. --will discontinue temazepam  for now --Patient request pharmacist to discuss alternatives with him  DVT prophylaxis: Xarelto   Code Status: Full Code Family Communication: Updated patient extensively at bedside this morning, no family present  Disposition Plan:  Level of care: Telemetry Cardiac Status is: Inpatient  Remains inpatient appropriate because:Ongoing diagnostic testing needed not appropriate for outpatient work up, IV treatments appropriate due to intensity of illness or inability to take PO and Inpatient level of care appropriate due to severity of illness   Dispo: The patient is from: Home              Anticipated d/c is to: Home              Anticipated d/c date is: > 3 days              Patient currently is not medically stable to d/c.   Difficult to place patient No   Consultants:   Cardiology/EP  Procedures:   none  Antimicrobials:   none   Subjective: Patient seen and examined bedside, resting comfortably in bedside chair.  Continues with shortness of breath and lower extremity edema.  Cardiology plans to initiate Tikosyn this evening.  Patient requesting to speak with pharmacist regarding medication to help him sleep at night that will not interact with new medication.  No other specific complaints or concerns at this time.  Denies headache, no current dizziness, no chest pain, no palpitations, no abdominal pain, no nausea/vomiting/diarrhea, no fever/chills/night sweats.  No acute events overnight per nursing staff.  Objective: Vitals:   08/26/20 0416 08/26/20 0910 08/26/20 1209 08/26/20 1210  BP: (!) 133/99 121/81 113/82 113/82  Pulse: 72 65 68 68  Resp: 14 20 (!) 24 17  Temp: 97.6 F (36.4 C) 98.3 F (36.8 C) (!) 95.8 F (35.4 C) (!) 96.9 F (36.1 C)  TempSrc: Oral Oral Oral Oral  SpO2: 97% 97% 98% 98%  Weight: 74.4 kg     Height: 5\' 10"  (1.778 m)       Intake/Output Summary (Last 24 hours) at 08/26/2020 1402 Last data filed at 08/26/2020 0419 Gross per 24 hour  Intake  240 ml  Output 660 ml  Net -420 ml   Filed Weights   08/24/20 1416 08/25/20 0600 08/26/20 0416  Weight: 75.3 kg 74.2 kg 74.4 kg    Examination:  General exam: Appears calm and comfortable  Respiratory system: Clear to auscultation. Respiratory effort normal.  Oxygenating well on room air Cardiovascular system: S1 & S2 heard, RRR. No JVD, murmurs, rubs, gallops or clicks.  1-2+ pitting edema bilateral lower extremities, asymmetric with right greater than left in which patient reports normal from his history of knee replacement Gastrointestinal system: Abdomen is nondistended, soft and nontender. No organomegaly or masses felt. Normal bowel sounds heard. Central nervous system: Alert and oriented. No focal neurological deficits. Extremities: Symmetric 5 x 5 power. Skin: No rashes, lesions or ulcers Psychiatry: Judgement and insight appear normal. Mood & affect appropriate.     Data Reviewed: I have personally reviewed following labs and imaging studies  CBC: Recent Labs  Lab 08/24/20 1430 08/25/20 0046  WBC 5.0 6.1  HGB 12.7* 12.4*  HCT 39.0 35.5*  MCV 92.9 89.4  PLT 146* 765*   Basic Metabolic Panel: Recent Labs  Lab 08/23/20 1401 08/24/20 1430 08/25/20 0046 08/26/20 0128  NA 135 136 137 134*  K 3.5 3.7 3.7 3.8  CL 97* 99 101 98  CO2 26 24 25 26   GLUCOSE 172* 122* 110* 96  BUN 24* 27* 29* 31*  CREATININE 1.14 1.19 1.26* 1.50*  CALCIUM 10.1 10.4* 10.3 10.1  MG 2.0  --   --  1.9   GFR: Estimated Creatinine Clearance: 39.9 mL/min (A) (by C-G formula based on SCr of 1.5 mg/dL (H)). Liver Function Tests: Recent Labs  Lab 08/23/20 1401 08/25/20 0046  AST 39 34  ALT 29 24  ALKPHOS 214* 218*  BILITOT 1.9* 2.0*  PROT 6.2* 6.0*  ALBUMIN 3.6 3.4*   No results for input(s): LIPASE, AMYLASE in the last 168 hours. No results for input(s): AMMONIA in the last 168 hours. Coagulation Profile: No results for input(s): INR, PROTIME in the last 168 hours. Cardiac  Enzymes: No results for input(s): CKTOTAL, CKMB, CKMBINDEX, TROPONINI in the last 168 hours. BNP (last 3 results) No results for input(s): PROBNP in the last 8760 hours. HbA1C: No results for input(s): HGBA1C in the last 72 hours. CBG: No results for input(s): GLUCAP in the last 168 hours. Lipid Profile: No results for input(s): CHOL, HDL, LDLCALC, TRIG, CHOLHDL, LDLDIRECT in the last 72 hours. Thyroid Function Tests: Recent Labs    08/23/20 1427  TSH 5.630*  FREET4 1.11   Anemia Panel: No results for input(s): VITAMINB12, FOLATE, FERRITIN, TIBC, IRON, RETICCTPCT in the last 72 hours. Sepsis Labs: No results for input(s): PROCALCITON, LATICACIDVEN in the last 168 hours.  Recent Results (from the past 240 hour(s))  SARS CORONAVIRUS 2 (TAT 6-24 HRS) Nasopharyngeal Nasopharyngeal Swab     Status: None   Collection Time: 08/17/20  2:04 PM   Specimen: Nasopharyngeal Swab  Result Value Ref Range Status   SARS Coronavirus 2 NEGATIVE NEGATIVE Final    Comment: (NOTE) SARS-CoV-2 target nucleic acids are NOT DETECTED.  The SARS-CoV-2 RNA is generally detectable in upper and lower respiratory specimens during the acute phase of infection. Negative results do not preclude SARS-CoV-2 infection, do not rule out co-infections with other pathogens, and should not be used as the sole basis for treatment or other patient management decisions. Negative results must be combined with clinical observations, patient history, and epidemiological information. The expected result is Negative.  Fact Sheet for Patients: SugarRoll.be  Fact Sheet for Healthcare Providers: https://www.woods-mathews.com/  This test is not yet approved or cleared by the [place] FDA and  has been authorized for detection and/or diagnosis of SARS-CoV-2 by FDA under an Emergency Use Authorization (EUA). This EUA will remain  in effect (meaning this test can be used) for the  duration of the COVID-19 declaration under Se ction 564(b)(1) of the Act, 21 U.S.C. section 360bbb-3(b)(1), unless the authorization is terminated or revoked sooner.  Performed at [facility], [place] [address], [place], [place] [address]   SARS CORONAVIRUS 2 (TAT 6-24 HRS) Nasopharyngeal Nasopharyngeal Swab     Status: None   Collection Time: 08/24/20 10:49 AM   Specimen: Nasopharyngeal Swab  Result Value Ref Range Status   SARS Coronavirus 2 NEGATIVE NEGATIVE Final    Comment: (NOTE) SARS-CoV-2 target nucleic acids are NOT DETECTED.  The SARS-CoV-2 RNA is generally detectable in upper and lower respiratory specimens during the acute phase of infection. Negative results do not preclude SARS-CoV-2 infection, do not rule out co-infections with other pathogens, and should not be used as the sole basis for treatment or other patient management decisions. Negative results must be combined with clinical observations, patient history, and epidemiological information. The expected result is Negative.  Fact Sheet for Patients: SugarRoll.be  Fact Sheet for Healthcare Providers: https://www.woods-mathews.com/  This test is not yet approved or cleared by the [place] FDA and  has been authorized for detection and/or diagnosis of SARS-CoV-2 by FDA under an Emergency Use Authorization (EUA). This EUA will remain  in effect (meaning this test can be used) for the duration of the COVID-19 declaration under Se ction 564(b)(1) of the Act, 21 U.S.C. section 360bbb-3(b)(1), unless the authorization is terminated or revoked sooner.  Performed at [facility], [place] [address], [place], [place] [address]          Radiology Studies: DG Chest 2 View  Result Date: 08/24/2020 CLINICAL DATA:  Shortness of breath.  Status post fall. EXAM: CHEST - 2 VIEW COMPARISON:  August 06, 2020, March 11, 2020 FINDINGS: The heart size and mediastinal  contours are stable. Cardiac pacemaker is unchanged. Mild increased pulmonary interstitium is identified bilaterally unchanged compared to March 11, 2020, chronic. Minimal bilateral pleural effusions are noted. No focal pneumonia is noted. The visualized skeletal structures are stable. IMPRESSION: No acute cardiopulmonary disease identified. Electronically Signed   By: Abelardo Diesel M.D.   On: 08/24/2020 14:59        Scheduled Meds: . atorvastatin  20 mg Oral QHS  . dofetilide  250 mcg Oral BID  . doxazosin  4 mg Oral Daily  . LORazepam  0.5 mg Oral QHS  . metoprolol succinate  25 mg Oral QHS  . montelukast  10 mg Oral Daily  . multivitamin with minerals  1 tablet Oral Daily  . rivaroxaban  20 mg Oral Q supper  . sodium chloride flush  3 mL Intravenous Q12H   Continuous Infusions: . sodium chloride       LOS: 2 days    Time spent: 38 minutes spent on chart review, discussion with nursing staff, consultants, updating family and interview/physical exam; more than 50% of that time was spent in counseling and/or coordination of care.    Meganne Rita J [place] DO [facility] Available via Epic secure chat 7am-7pm After these hours, please refer to coverage provider listed on [email or web address] 08/26/2020, 2:02 PM

## 2020-08-27 ENCOUNTER — Ambulatory Visit (HOSPITAL_COMMUNITY): Payer: Medicare Other | Admitting: Physician Assistant

## 2020-08-27 ENCOUNTER — Inpatient Hospital Stay: Admission: AD | Admit: 2020-08-27 | Payer: Medicare Other | Source: Ambulatory Visit | Admitting: Internal Medicine

## 2020-08-27 ENCOUNTER — Encounter (HOSPITAL_COMMUNITY): Payer: Self-pay

## 2020-08-27 DIAGNOSIS — I10 Essential (primary) hypertension: Secondary | ICD-10-CM

## 2020-08-27 LAB — BASIC METABOLIC PANEL
Anion gap: 11 (ref 5–15)
BUN: 29 mg/dL — ABNORMAL HIGH (ref 8–23)
CO2: 26 mmol/L (ref 22–32)
Calcium: 10.1 mg/dL (ref 8.9–10.3)
Chloride: 96 mmol/L — ABNORMAL LOW (ref 98–111)
Creatinine, Ser: 1.28 mg/dL — ABNORMAL HIGH (ref 0.61–1.24)
GFR, Estimated: 56 mL/min — ABNORMAL LOW (ref >60)
Glucose, Bld: 101 mg/dL — ABNORMAL HIGH (ref 70–99)
Potassium: 3.9 mmol/L (ref 3.5–5.1)
Sodium: 133 mmol/L — ABNORMAL LOW (ref 135–145)

## 2020-08-27 LAB — PROTIME-INR
INR: 4 — ABNORMAL HIGH (ref 0.8–1.2)
Prothrombin Time: 37.5 seconds — ABNORMAL HIGH (ref 11.4–15.2)

## 2020-08-27 LAB — MAGNESIUM: Magnesium: 2.2 mg/dL (ref 1.7–2.4)

## 2020-08-27 MED ORDER — MENTHOL 3 MG MT LOZG
1.0000 | LOZENGE | OROMUCOSAL | Status: DC | PRN
  Filled 2020-08-27: qty 9

## 2020-08-27 MED ORDER — POTASSIUM CHLORIDE CRYS ER 20 MEQ PO TBCR
40.0000 meq | EXTENDED_RELEASE_TABLET | Freq: Two times a day (BID) | ORAL | Status: AC
  Administered 2020-08-27 (×2): 40 meq via ORAL
  Filled 2020-08-27 (×2): qty 2

## 2020-08-27 MED ORDER — FUROSEMIDE 10 MG/ML IJ SOLN
80.0000 mg | Freq: Once | INTRAMUSCULAR | Status: AC
  Administered 2020-08-27: 80 mg via INTRAVENOUS
  Filled 2020-08-27: qty 8

## 2020-08-27 MED ORDER — MELATONIN 5 MG PO TABS
5.0000 mg | ORAL_TABLET | Freq: Every day | ORAL | Status: DC
  Administered 2020-08-27 – 2020-08-28 (×2): 5 mg via ORAL
  Filled 2020-08-27 (×2): qty 1

## 2020-08-27 NOTE — Progress Notes (Signed)
Pharmacy: Dofetilide (Tikosyn) - Initial Consult Assessment and Electrolyte Replacement  Pharmacy consulted to assist in monitoring and replacing electrolytes in this 80 y.o. male admitted on 08/24/2020 undergoing dofetilide initiation. First dofetilide dose: 8pm 1/24 dofetilide 279mcg bid.   Assessment:  Patient Exclusion Criteria: If any screening criteria checked as "Yes", then  patient  should NOT receive dofetilide until criteria item is corrected.  If "Yes" please indicate correction plan.  YES  NO Patient  Exclusion Criteria Correction Plan   []   [x]   Baseline QTc interval is greater than or equal to 440 msec. IF above YES box checked dofetilide contraindicated unless patient has ICD; then may proceed if QTc 500-550 msec or with known ventricular conduction abnormalities may proceed with QTc 550-600 msec. QTc = 0.4    []   [x]   Patient is known or suspected to have a digoxin level greater than 2 ng/ml: No results found for: DIGOXIN     []   [x]   Creatinine clearance less than 20 ml/min (calculated using Cockcroft-Gault, actual body weight and serum creatinine): Estimated Creatinine Clearance: 46.7 mL/min (A) (by C-G formula based on SCr of 1.28 mg/dL (H)).     []   [x]  Patient has received drugs known to prolong the QT intervals within the last 48 hours (phenothiazines, tricyclics or tetracyclic antidepressants, erythromycin, H-1 antihistamines, cisapride, fluoroquinolones, azithromycin, ondansetron).   Updated information on QT prolonging agents is available to be searched on the following database:QT prolonging agents     []   [x]   Patient received a dose of hydrochlorothiazide (Oretic) alone or in any combination including triamterene (Dyazide, Maxzide) in the last 48 hours.    []   [x]  Patient received a medication known to increase dofetilide plasma concentrations prior to initial dofetilide dose:  . Trimethoprim (Primsol, Proloprim) in the last 36 hours . Verapamil  (Calan, Verelan) in the last 36 hours or a sustained release dose in the last 72 hours . Megestrol (Megace) in the last 5 days  . Cimetidine (Tagamet) in the last 6 hours . Ketoconazole (Nizoral) in the last 24 hours . Itraconazole (Sporanox) in the last 48 hours  . Prochlorperazine (Compazine) in the last 36 hours     []   [x]   Patient is known to have a history of torsades de pointes; congenital or acquired long QT syndromes.    []   [x]   Patient has received a Class 1 antiarrhythmic with less than 2 half-lives since last dose. (Disopyramide, Quinidine, Procainamide, Lidocaine, Mexiletine, Flecainide, Propafenone)    []   [x]   Patient has received amiodarone therapy in the past 3 months or amiodarone level is greater than 0.3 ng/ml.    Patient has been appropriately anticoagulated with rivaroxaban 20mg  daily.   Labs:    Component Value Date/Time   K 3.9 08/27/2020 0213   MG 2.2 08/27/2020 0213     Plan: Potassium: K 3.9 after KCL 40MeQ x2 yesterday  - repeat same today   Magnesium: Mag 2.1 after Mag 2gm x1 yesterday -no need to replace today   Bonnita Nasuti Pharm.D. CPP, BCPS Clinical Pharmacist [phone] 08/27/2020 7:42 AM

## 2020-08-27 NOTE — Progress Notes (Signed)
12 Lead 2 hour post tikosyn dose QT/QTc 494/501. QTc prior to dose 420 per EKG strip. Payton Emerald, RN

## 2020-08-27 NOTE — Progress Notes (Signed)
Morning EKG reviewed  Shows pt remains in afib at 62 bpm (V pacing) with stable QTc at approximately 450 ms when corrected for his paced QRS of nearly 170 ms.   Earlier EKG was under-representing his QT with his relatively narrow PVCs.   Continue Tikosyn 250 mcg BID.   Pt will be NPO after midnight for DCCV if remains in [place], [place]  Pager: [phone]  08/27/2020 12:22 PM

## 2020-08-27 NOTE — Progress Notes (Addendum)
Electrophysiology Rounding Note  Patient Name: Alexander Murray, Dr. Date of Encounter: 08/27/2020  Primary Cardiologist: No primary care provider on file.  Electrophysiologist: [name], MD    Subjective   Pt remains in afib on Tikosyn 250 mcg BID   QTc from EKG last pm shows stable QTc at ~480 (Likely even better given his wide paced QRS) This morning's EKG is confounded by his PVCs, which are relatively narrow.   Feels like his swelling has improved. But overall feels about the same.   Inpatient Medications    Scheduled Meds: . atorvastatin  20 mg Oral QHS  . dofetilide  250 mcg Oral BID  . doxazosin  4 mg Oral Daily  . LORazepam  0.5 mg Oral QHS  . melatonin  5 mg Oral QHS  . metoprolol succinate  25 mg Oral QHS  . montelukast  10 mg Oral Daily  . multivitamin with minerals  1 tablet Oral Daily  . rivaroxaban  20 mg Oral Q supper  . sodium chloride flush  3 mL Intravenous Q12H   Continuous Infusions: . sodium chloride     PRN Meds: sodium chloride, acetaminophen **OR** acetaminophen, fluticasone, HYDROcodone-homatropine, ondansetron **OR** ondansetron (ZOFRAN) IV, oxyCODONE, polyethylene glycol, sodium chloride flush   Vital Signs    Vitals:   08/26/20 1947 08/26/20 2310 08/27/20 0349 08/27/20 0351  BP: 105/82 127/88 119/76   Pulse: 64 72 66   Resp: 14 17 20    Temp: (!) 97.4 F (36.3 C) 98 F (36.7 C) 97.6 F (36.4 C)   TempSrc: Axillary Oral Oral   SpO2: 97% 98% 94%   Weight:    74.3 kg  Height:        Intake/Output Summary (Last 24 hours) at 08/27/2020 2119 Last data filed at 08/27/2020 0349 Gross per 24 hour  Intake 480 ml  Output 1930 ml  Net -1450 ml   Filed Weights   08/25/20 0600 08/26/20 0416 08/27/20 0351  Weight: 74.2 kg 74.4 kg 74.3 kg    Physical Exam    GEN- The patient is well appearing, alert and oriented x 3 today.   Head- normocephalic, atraumatic Eyes-  Sclera clear, conjunctiva pink Ears- hearing intact Oropharynx-  clear Neck- supple Lungs- Clear to ausculation bilaterally, normal work of breathing Heart- Regular (V paced) rate and rhythm, no murmurs, rubs or gallops GI- soft, NT, ND, + BS Extremities- no clubbing, cyanosis, or edema Skin- no rash or lesion Psych- euthymic mood, full affect Neuro- strength and sensation are intact  Labs    CBC Recent Labs    08/24/20 1430 08/25/20 0046  WBC 5.0 6.1  HGB 12.7* 12.4*  HCT 39.0 35.5*  MCV 92.9 89.4  PLT 146* 417*   Basic Metabolic Panel Recent Labs    08/26/20 0128 08/26/20 1520 08/27/20 0213  NA 134* 134* 133*  K 3.8 4.1 3.9  CL 98 100 96*  CO2 26 26 26   GLUCOSE 96 134* 101*  BUN 31* 29* 29*  CREATININE 1.50* 1.31* 1.28*  CALCIUM 10.1 10.1 10.1  MG 1.9  --  2.2    Potassium  Date/Time Value Ref Range Status  08/27/2020 02:13 AM 3.9 3.5 - 5.1 mmol/L Final   Magnesium  Date/Time Value Ref Range Status  08/27/2020 02:13 AM 2.2 1.7 - 2.4 mg/dL Final    Comment:    Performed at [facility], [address], [place], [place] [address]    Telemetry    V paced 60s underlying  AF (personally reviewed)  Radiology    No results found.   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   Assessment & Plan    1. Persistent atrial fibrillation Pt remains in afib on Tikosyn 250 mcg BID  K 3.9. Supp. Mg 2.2.  CHA2DS2VASC is at least 6. Previously Flecainide stopped 08/23/20 due to recurrent AF S/p DCCV 08/19/20, He was back in AF on visit 1/21 and remains in AF currently by tele.  QT in SR 420-430 when corrected for V pacing. ( paced QRS ~170) Patient has checked on price of medication and will use [employer]. Will send medications to pharmacy for review. Continue Toprol 25 mg  daily. Stop ASA Continue Xarelto 20 mg daily, he has not missed any doses. (Eliquis not covered by patient insurance)  2.Acute on chronic diastolic CHF UOP improved yesterday with a dose of IV lasix 80 mg.  Follow potassium and Mg closely with diuresis on tikosyn.  Repeat dose of lasix 80 mg this am. Supp K 40 meq BID today. (similar to yesterday)  3. HTN Stable, med changes as above.  4. Second degree AV block type II S/p PPM,followed by Dr Caryl Comes  Device has been stable.   5. Small cell lung cancer Currently undergoing chemotherapy. Plans per oncology.  6. AKI  Cr 1.19 -> 1.26 -> 1.50 -> 1.28 with 80 mg IV lasix.   7. Sleep disturbance  Slept OK initially on melatonin but woke up frequently.  Wants to try the ativan tonight.   If pt does not convert chemically, plan on DCCV tomorrow    For questions or updates, please contact [place] Please consult [email or web address] for contact info under Cardiology/STEMI.  Signed, Shirley Friar, PA-C  08/27/2020, 7:12 AM   As above  Will plan DCCV in am if not spontaneously converting  Diuresing  Continue

## 2020-08-27 NOTE — Progress Notes (Signed)
PROGRESS NOTE    Alexander Murray, Dr.  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  Symptoms of dyspnea have been progressing over the last 2-3 days with increased lower extremity edema. Also, patient reports decreased appetite given that he feels full with some abdominal distention which he relates to fluid overload.  Additionally, patient reports that he fell in his bathroom after feeling dizzy but denies any loss of consciousness or syncopal episode.    Recent cardioversions, last being 5 days ago for his atrial fibrillation.  Symptoms worse with exertion and improved with rest.  Recently had medication changes by cardiology with discontinuation of Cardizem and furosemide and started torsemide and metoprolol.  Patient reports that he does not believe he has had adequate urine output with the torsemide.  He was also instructed recently to stop his flecainide with planned transition to Tikosyn.  Patient reports last dose of flecainide was the morning of 08/23/2020.  In the ED, temperature 97.6, HR 58-73, RR 12, BP 105/73, SPO2 99% on room air.  Sodium 136, potassium 3.7, chloride 99, CO2 24, glucose 122, BUN 27, creatinine 1.19.  WBC 5.0, hemoglobin 12.7, platelets 146.  SARS-CoV-2/COVID-19 negative.  CXR with no acute cardiopulmonary disease identified.  EKG notable for V paced rhythm, rate 61.  Cardiology was consulted by EDP for admission, deferred to hospitalist admission and Dr. Radford Pax stated EP will follow  patient in the a.m.   Assessment & Plan:   Active Problems:   S/P partial lobectomy of lung   Small cell lung cancer, right lower lobe ([name])   Persistent atrial fibrillation ([name])   CHF (congestive heart failure) ([name])   Acute on chronic diastolic congestive heart failure Patient presenting with progressive shortness of breath, recent changes by cardiology clinic with transition of furosemide to torsemide.  Patient continues with lower extremity edema.  Elevated BNP of 976.  Chest x-ray with no acute cardiopulmonary disease process.  --Cardiology consulted, Dr. Radford Pax recommends EP evaluation --net negative 181mL past 24h and net negative 440 mLsince admission --wt 166>163.5>164 lbs --Cards plans to repeat Lasix 80mg  IV x 1 today --Strict I's and O's and daily weights --Follow BMP daily  Permanent atrial fibrillation Patient follows with cardiology/EP Dr. Caryl Comes outpatient.  Recent cardioversion 08/19/2020.  Patient was previously on flecainide, which has been discontinued with last dose reported on 08/23/2020 with planned initiation of Tikosyn.  EKG with V paced rhythm, rate 61.  TSH 5.630 with a free T4 of 1.11. --Cardiology/EP following --Continue metoprolol succinate 25 mg p.o. daily --Xarelto 20 mg p.o. daily --Started Tikosyn 250mg  PO BID yesterday --If patient does not convert chemically, EP plans DCCV tomorrow  HLD: Continue statin  Hx essential hypertension: Patient with recent discontinuation of Cardizem.  Patient reports that his blood pressures have been low normal as of recent, he relates it to his diagnosis of atrial fibrillation. --Continue metoprolol succinate 25 mg p.o. daily  Small cell lung cancer Patient follows with medical oncology, Dr. Earlie Server.  Reports completed chemotherapy in November 2021.  Continue outpatient follow-up with oncology.  Insomnia Patient reports "groggy feeling"  with temazepam which may have caused some of his symptoms regards to  dizziness. --Discontinue temazepam --Melatonin 5 mg p.o. nightly --Lorazepam 0.5 mg nightly  DVT prophylaxis: Xarelto   Code Status: Full Code Family Communication: Updated patient extensively at bedside this morning, no family present  Disposition Plan:  Level of care: Telemetry Cardiac Status is: Inpatient  Remains inpatient appropriate because:Ongoing diagnostic testing needed not appropriate for outpatient work up, IV treatments appropriate due to intensity of illness or inability to take PO and Inpatient level of care appropriate due to severity of illness   Dispo: The patient is from: Home              Anticipated d/c is to: Home              Anticipated d/c date is: 2 days              Patient currently is not medically stable to d/c.   Difficult to place patient No   Consultants:   Cardiology/EP  Procedures:   none  Antimicrobials:   none   Subjective: Patient seen and examined bedside, resting comfortably in bed.  No family present.  Continues with shortness of breath, unchanged.  Reports increased urine output yesterday with IV Lasix.  Started on Tikosyn yesterday and EP plans for DCCV tomorrow if does not convert chemically.  No other specific complaints or concerns at this time.  Denies headache, no current dizziness, no chest pain, no palpitations, no abdominal pain, no nausea/vomiting/diarrhea, no fever/chills/night sweats.  No acute events overnight per nursing staff.  Objective: Vitals:   08/26/20 2310 08/27/20 0349 08/27/20 0351 08/27/20 0802  BP: 127/88 119/76  128/90  Pulse: 72 66  84  Resp: 17 20  20   Temp: 98 F (36.7 C) 97.6 F (36.4 C)  97.6 F (36.4 C)  TempSrc: Oral Oral  Oral  SpO2: 98% 94%  98%  Weight:   74.3 kg   Height:        Intake/Output Summary (Last 24 hours) at 08/27/2020 1017 Last data filed at 08/27/2020 0802 Gross per 24 hour  Intake 480 ml  Output 2055 ml  Net -1575 ml   Filed Weights   08/25/20 0600 08/26/20 0416  08/27/20 0351  Weight: 74.2 kg 74.4 kg 74.3 kg    Examination:  General exam: Appears calm and comfortable  Respiratory system: Clear to auscultation. Respiratory effort normal.  Oxygenating well on room air Cardiovascular system: S1 & S2 heard, RRR. No JVD, murmurs, rubs, gallops or clicks.  1-2+ pitting edema bilateral lower extremities, asymmetric with right greater than left in which patient reports normal from his history of knee replacement Gastrointestinal system: Abdomen is nondistended, soft and nontender. No organomegaly or masses felt. Normal bowel sounds heard. Central nervous system: Alert and oriented. No focal neurological deficits. Extremities: Symmetric 5 x 5 power. Skin: No rashes, lesions or ulcers Psychiatry: Judgement and insight appear normal. Mood & affect appropriate.     Data Reviewed: I have personally reviewed following labs and imaging studies  CBC: Recent Labs  Lab 08/24/20 1430 08/25/20 0046  WBC 5.0 6.1  HGB 12.7* 12.4*  HCT 39.0 35.5*  MCV 92.9 89.4  PLT 146* 657*   Basic Metabolic Panel: Recent Labs  Lab 08/23/20 1401 08/24/20 1430 08/25/20 0046 08/26/20 0128 08/26/20 1520 08/27/20 0213  NA 135 136 137 134* 134* 133*  K 3.5 3.7 3.7 3.8 4.1 3.9  CL 97* 99 101 98 100 96*  CO2 26 24 25 26 26 26   GLUCOSE 172* 122* 110* 96 134* 101*  BUN 24* 27* 29* 31* 29* 29*  CREATININE 1.14 1.19 1.26* 1.50* 1.31* 1.28*  CALCIUM 10.1 10.4* 10.3 10.1 10.1 10.1  MG 2.0  --   --  1.9  --  2.2   GFR: Estimated Creatinine Clearance: 46.7 mL/min (A) (by C-G formula based on SCr of 1.28 mg/dL (H)). Liver Function Tests: Recent Labs  Lab 08/23/20 1401 08/25/20 0046  AST 39 34  ALT 29 24  ALKPHOS 214* 218*  BILITOT 1.9* 2.0*  PROT 6.2* 6.0*  ALBUMIN 3.6 3.4*   No results for input(s): LIPASE, AMYLASE in the last 168 hours. No results for input(s): AMMONIA in the last 168 hours. Coagulation Profile: No results for input(s): INR, PROTIME in the  last 168 hours. Cardiac Enzymes: No results for input(s): CKTOTAL, CKMB, CKMBINDEX, TROPONINI in the last 168 hours. BNP (last 3 results) No results for input(s): PROBNP in the last 8760 hours. HbA1C: No results for input(s): HGBA1C in the last 72 hours. CBG: No results for input(s): GLUCAP in the last 168 hours. Lipid Profile: No results for input(s): CHOL, HDL, LDLCALC, TRIG, CHOLHDL, LDLDIRECT in the last 72 hours. Thyroid Function Tests: No results for input(s): TSH, T4TOTAL, FREET4, T3FREE, THYROIDAB in the last 72 hours. Anemia Panel: No results for input(s): VITAMINB12, FOLATE, FERRITIN, TIBC, IRON, RETICCTPCT in the last 72 hours. Sepsis Labs: No results for input(s): PROCALCITON, LATICACIDVEN in the last 168 hours.  Recent Results (from the past 240 hour(s))  SARS CORONAVIRUS 2 (TAT 6-24 HRS) Nasopharyngeal Nasopharyngeal Swab     Status: None   Collection Time: 08/17/20  2:04 PM   Specimen: Nasopharyngeal Swab  Result Value Ref Range Status   SARS Coronavirus 2 NEGATIVE NEGATIVE Final    Comment: (NOTE) SARS-CoV-2 target nucleic acids are NOT DETECTED.  The SARS-CoV-2 RNA is generally detectable in upper and lower respiratory specimens during the acute phase of infection. Negative results do not preclude SARS-CoV-2 infection, do not rule out co-infections with other pathogens, and should not be used as the sole basis for treatment or other patient management decisions. Negative results must be combined with clinical observations, patient history, and epidemiological information. The expected result is Negative.  Fact Sheet for Patients: SugarRoll.be  Fact Sheet for Healthcare Providers: https://www.woods-mathews.com/  This test is not yet approved or cleared by the [place] FDA and  has been authorized for detection and/or diagnosis of SARS-CoV-2 by FDA under an Emergency Use Authorization (EUA). This EUA will remain   in effect (meaning this test can be used) for the duration of the COVID-19 declaration under Se ction 564(b)(1) of the Act, 21 U.S.C. section 360bbb-3(b)(1), unless the authorization is terminated or revoked sooner.  Performed at [facility], [place] [address], [place], [place] [address]   SARS CORONAVIRUS 2 (TAT 6-24 HRS) Nasopharyngeal Nasopharyngeal Swab     Status: None   Collection Time: 08/24/20 10:49 AM   Specimen: Nasopharyngeal Swab  Result Value Ref Range Status   SARS Coronavirus 2 NEGATIVE NEGATIVE Final    Comment: (NOTE) SARS-CoV-2 target nucleic acids are NOT DETECTED.  The SARS-CoV-2 RNA is generally detectable in upper and lower respiratory specimens during the acute phase of infection. Negative results do not preclude SARS-CoV-2 infection, do not rule out co-infections with other pathogens, and should not be used as the sole basis for treatment or other patient management decisions. Negative results must be combined  with clinical observations, patient history, and epidemiological information. The expected result is Negative.  Fact Sheet for Patients: SugarRoll.be  Fact Sheet for Healthcare Providers: https://www.woods-mathews.com/  This test is not yet approved or cleared by the [place] FDA and  has been authorized for detection and/or diagnosis of SARS-CoV-2 by FDA under an Emergency Use Authorization (EUA). This EUA will remain  in effect (meaning this test can be used) for the duration of the COVID-19 declaration under Se ction 564(b)(1) of the Act, 21 U.S.C. section 360bbb-3(b)(1), unless the authorization is terminated or revoked sooner.  Performed at [facility] [address], [place], [place] [address]          Radiology Studies: No results found.      Scheduled Meds: . atorvastatin  20 mg Oral QHS  . dofetilide  250 mcg Oral BID  . doxazosin  4 mg Oral Daily  . LORazepam   0.5 mg Oral QHS  . melatonin  5 mg Oral QHS  . metoprolol succinate  25 mg Oral QHS  . montelukast  10 mg Oral Daily  . multivitamin with minerals  1 tablet Oral Daily  . potassium chloride  40 mEq Oral BID  . rivaroxaban  20 mg Oral Q supper  . sodium chloride flush  3 mL Intravenous Q12H   Continuous Infusions: . sodium chloride       LOS: 3 days    Time spent: 35 minutes spent on chart review, discussion with nursing staff, consultants, updating family and interview/physical exam; more than 50% of that time was spent in counseling and/or coordination of care.    Mccall Lomax J [place] DO [facility] Available via Epic secure chat 7am-7pm After these hours, please refer to coverage provider listed on [email or web address] 08/27/2020, 10:17 AM

## 2020-08-28 ENCOUNTER — Encounter (HOSPITAL_COMMUNITY)
Admission: EM | Disposition: A | Discharge status: Home / Self Care | Payer: Self-pay | Source: Home / Self Care | Attending: Internal Medicine

## 2020-08-28 ENCOUNTER — Ambulatory Visit (HOSPITAL_COMMUNITY): Payer: Medicare Other | Admitting: Physician Assistant

## 2020-08-28 DIAGNOSIS — Z902 Acquired absence of lung [part of]: Secondary | ICD-10-CM

## 2020-08-28 DIAGNOSIS — C3431 Malignant neoplasm of lower lobe, right bronchus or lung: Secondary | ICD-10-CM

## 2020-08-28 LAB — BASIC METABOLIC PANEL WITH GFR
Anion gap: 8 (ref 5–15)
BUN: 27 mg/dL — ABNORMAL HIGH (ref 8–23)
CO2: 26 mmol/L (ref 22–32)
Calcium: 10.1 mg/dL (ref 8.9–10.3)
Chloride: 98 mmol/L (ref 98–111)
Creatinine, Ser: 1.24 mg/dL (ref 0.61–1.24)
GFR, Estimated: 58 mL/min — ABNORMAL LOW
Glucose, Bld: 93 mg/dL (ref 70–99)
Potassium: 4.4 mmol/L (ref 3.5–5.1)
Sodium: 132 mmol/L — ABNORMAL LOW (ref 135–145)

## 2020-08-28 LAB — MAGNESIUM: Magnesium: 2.1 mg/dL (ref 1.7–2.4)

## 2020-08-28 SURGERY — CARDIOVERSION
Anesthesia: Monitor Anesthesia Care

## 2020-08-28 MED ORDER — FUROSEMIDE 10 MG/ML IJ SOLN
60.0000 mg | Freq: Once | INTRAMUSCULAR | Status: AC
  Administered 2020-08-28: 60 mg via INTRAVENOUS
  Filled 2020-08-28: qty 6

## 2020-08-28 MED ORDER — PANTOPRAZOLE SODIUM 40 MG PO TBEC
40.0000 mg | DELAYED_RELEASE_TABLET | Freq: Every day | ORAL | Status: DC
  Administered 2020-08-28 – 2020-08-29 (×2): 40 mg via ORAL
  Filled 2020-08-28 (×2): qty 1

## 2020-08-28 MED ORDER — POTASSIUM CHLORIDE CRYS ER 20 MEQ PO TBCR
40.0000 meq | EXTENDED_RELEASE_TABLET | Freq: Once | ORAL | Status: AC
  Administered 2020-08-28: 40 meq via ORAL
  Filled 2020-08-28: qty 2

## 2020-08-28 NOTE — Progress Notes (Signed)
Pharmacy: Dofetilide (Tikosyn) - Initial Consult Assessment and Electrolyte Replacement  Pharmacy consulted to assist in monitoring and replacing electrolytes in this 80 y.o. male admitted on 08/24/2020 undergoing dofetilide initiation. First dofetilide dose: 8pm 1/24 dofetilide 247mcg bid.   Assessment:  Patient Exclusion Criteria: If any screening criteria checked as "Yes", then  patient  should NOT receive dofetilide until criteria item is corrected.  If "Yes" please indicate correction plan.  YES  NO Patient  Exclusion Criteria Correction Plan   []   [x]   Baseline QTc interval is greater than or equal to 440 msec. IF above YES box checked dofetilide contraindicated unless patient has ICD; then may proceed if QTc 500-550 msec or with known ventricular conduction abnormalities may proceed with QTc 550-600 msec. QTc = 0.46    []   [x]   Patient is known or suspected to have a digoxin level greater than 2 ng/ml: No results found for: DIGOXIN     []   [x]   Creatinine clearance less than 20 ml/min (calculated using Cockcroft-Gault, actual body weight and serum creatinine): Estimated Creatinine Clearance: 48.2 mL/min (by C-G formula based on SCr of 1.24 mg/dL).     []   [x]  Patient has received drugs known to prolong the QT intervals within the last 48 hours (phenothiazines, tricyclics or tetracyclic antidepressants, erythromycin, H-1 antihistamines, cisapride, fluoroquinolones, azithromycin, ondansetron).   Updated information on QT prolonging agents is available to be searched on the following database:QT prolonging agents     []   [x]   Patient received a dose of hydrochlorothiazide (Oretic) alone or in any combination including triamterene (Dyazide, Maxzide) in the last 48 hours.    []   [x]  Patient received a medication known to increase dofetilide plasma concentrations prior to initial dofetilide dose:  . Trimethoprim (Primsol, Proloprim) in the last 36 hours . Verapamil (Calan,  Verelan) in the last 36 hours or a sustained release dose in the last 72 hours . Megestrol (Megace) in the last 5 days  . Cimetidine (Tagamet) in the last 6 hours . Ketoconazole (Nizoral) in the last 24 hours . Itraconazole (Sporanox) in the last 48 hours  . Prochlorperazine (Compazine) in the last 36 hours     []   [x]   Patient is known to have a history of torsades de pointes; congenital or acquired long QT syndromes.    []   [x]   Patient has received a Class 1 antiarrhythmic with less than 2 half-lives since last dose. (Disopyramide, Quinidine, Procainamide, Lidocaine, Mexiletine, Flecainide, Propafenone)    []   [x]   Patient has received amiodarone therapy in the past 3 months or amiodarone level is greater than 0.3 ng/ml.    Patient has been appropriately anticoagulated with rivaroxaban 20mg  daily.   Labs:    Component Value Date/Time   K 4.4 08/28/2020 0034   MG 2.1 08/28/2020 0034     Plan: Potassium: K4.4 after replaced yesterday - will not need potassium today if no furosemide given If ongoing diureses will replace K accordingly.    Magnesium: Mag 2.1 after Mag 2gm x1 earlier this week - no replacement needed today    Bonnita Nasuti Pharm.D. CPP, BCPS Clinical Pharmacist [phone] 08/28/2020 7:24 AM

## 2020-08-28 NOTE — Progress Notes (Signed)
PT Discharge Note  Patient Details Name: Alexander Murray, Dr. MRN: [id number] DOB: [birth year]   Observed pt on 2 different occasions ambulating with RW in hallway independently.  Demonstrated safe gait at near normal speeds.  No further acute PT indicated as goals met and pt completing mobility independently.  Spoke with pt who is in agreement. Abran Richard, PT Acute Rehab Services Pager [phone] [facility] [place] 08/28/2020, 4:44 PM

## 2020-08-28 NOTE — Progress Notes (Signed)
PROGRESS NOTE    Alexander Murray, Dr.  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  Symptoms of dyspnea have been progressing over the last 2-3 days with increased lower extremity edema. Also, patient reports decreased appetite given that he feels full with some abdominal distention which he relates to fluid overload.  Additionally, patient reports that he fell in his bathroom after feeling dizzy but denies any loss of consciousness or syncopal episode.    Recent cardioversions, last being 5 days ago for his atrial fibrillation.  Symptoms worse with exertion and improved with rest.  Recently had medication changes by cardiology with discontinuation of Cardizem and furosemide and started torsemide and metoprolol.  Patient reports that he does not believe he has had adequate urine output with the torsemide.  He was also instructed recently to stop his flecainide with planned transition to Tikosyn.  Patient reports last dose of flecainide was the morning of 08/23/2020.  In the ED, temperature 97.6, HR 58-73, RR 12, BP 105/73, SPO2 99% on room air.  Sodium 136, potassium 3.7, chloride 99, CO2 24, glucose 122, BUN 27, creatinine 1.19.  WBC 5.0, hemoglobin 12.7, platelets 146.  SARS-CoV-2/COVID-19 negative.  CXR with no acute cardiopulmonary disease identified.  EKG notable for V paced rhythm, rate 61.  Cardiology was consulted by EDP for admission, deferred to hospitalist admission and Dr. Radford Pax stated EP will follow  patient in the a.m.   Assessment & Plan:   Active Problems:   S/P partial lobectomy of lung   Small cell lung cancer, right lower lobe ([name])   Persistent atrial fibrillation ([name])   CHF (congestive heart failure) ([name])   Acute on chronic diastolic congestive heart failure Patient presenting with progressive shortness of breath, recent changes by cardiology clinic with transition of furosemide to torsemide.  Patient continues with lower extremity edema.  Elevated BNP of 976.  Chest x-ray with no acute cardiopulmonary disease process.  --Cardiology consulted, Dr. Radford Pax recommends EP evaluation --net negative 1.4L past 24h and net negative 3.2Lsince admission --wt 166>165.3lbs --Cards plans to repeat Lasix 60mg  IV x 1 today; and to start PO torsemide 1/27 --Strict I's and O's and daily weights --Follow BMP daily  Permanent atrial fibrillation Patient follows with cardiology/EP Dr. Caryl Comes outpatient.  Recent cardioversion 08/19/2020.  Patient was previously on flecainide, which has been discontinued with last dose reported on 08/23/2020 with planned initiation of Tikosyn.  EKG with V paced rhythm, rate 61.  TSH 5.630 with a free T4 of 1.11. --Cardiology/EP following --Continue metoprolol succinate 25 mg p.o. daily --Xarelto 20 mg p.o. daily --Started Tikosyn 250mg  PO BID yesterday --Patient converted on Tikosyn, will not require DCCV that was planned for today --Continue monitor on telemetry, possibly home tomorrow if QTC and volume status stable  HLD: Continue statin  Hx essential hypertension: Patient with recent discontinuation of Cardizem.  Patient reports that his blood pressures have been low normal as of recent, he relates it to his diagnosis of atrial fibrillation. --Continue metoprolol succinate 25 mg p.o. daily  Small cell lung cancer Patient follows with medical oncology, Dr.  Mohammed.  Reports completed chemotherapy in November 2021.  Continue outpatient follow-up with  oncology.  Insomnia Patient reports "groggy feeling" with temazepam which may have caused some of his symptoms regards to dizziness. --Discontinue temazepam --Melatonin 5 mg p.o. nightly --Lorazepam 0.5 mg nightly  DVT prophylaxis: Xarelto   Code Status: Full Code Family Communication: Updated patient extensively at bedside this morning, no family present  Disposition Plan:  Level of care: Telemetry Cardiac Status is: Inpatient  Remains inpatient appropriate because:Ongoing diagnostic testing needed not appropriate for outpatient work up, IV treatments appropriate due to intensity of illness or inability to take PO and Inpatient level of care appropriate due to severity of illness   Dispo: The patient is from: Home              Anticipated d/c is to: Home              Anticipated d/c date is: 2 days              Patient currently is not medically stable to d/c.   Difficult to place patient No   Consultants:   Cardiology/EP  Procedures:   none  Antimicrobials:   none   Subjective: Patient seen and examined bedside, resting comfortably in bedside chair.  No family present.  Reports shortness of breath slightly improved, reports good urine output and improvement in his lower extremity edema.  Patient converted overnight on Tikosyn will not require cardioversion per cardiology today.  No other specific complaints or concerns at this time.  Denies headache, no current dizziness, no chest pain, no palpitations, no abdominal pain, no nausea/vomiting/diarrhea, no fever/chills/night sweats.  No acute events overnight per nursing staff.  Objective: Vitals:   08/27/20 2240 08/27/20 2322 08/28/20 0353 08/28/20 0837  BP: (!) 132/93 (!) 104/59 121/87 123/82  Pulse: 80 77 73 64  Resp:  16 19 16   Temp:  97.6 F (36.4 C) 97.9 F (36.6 C)   TempSrc:  Oral Oral   SpO2:  99% 92%   Weight:   75 kg   Height:        Intake/Output Summary (Last 24 hours) at 08/28/2020 1010 Last data  filed at 08/28/2020 0038 Gross per 24 hour  Intake --  Output 1225 ml  Net -1225 ml   Filed Weights   08/26/20 0416 08/27/20 0351 08/28/20 0353  Weight: 74.4 kg 74.3 kg 75 kg    Examination:  General exam: Appears calm and comfortable  Respiratory system: Clear to auscultation. Respiratory effort normal.  Oxygenating well on room air Cardiovascular system: S1 & S2 heard, RRR. No JVD, murmurs, rubs, gallops or clicks.  1-2+ pitting edema bilateral lower extremities with TED hose in place, asymmetric with right greater than left in which patient reports normal from his history of knee replacement Gastrointestinal system: Abdomen is nondistended, soft and nontender. No organomegaly or masses felt. Normal bowel sounds heard. Central nervous system: Alert and oriented. No focal neurological deficits. Extremities: Symmetric 5 x 5 power. Skin: No rashes, lesions or ulcers Psychiatry: Judgement and insight appear normal. Mood & affect appropriate.     Data Reviewed: I have personally reviewed following labs and imaging studies  CBC: Recent Labs  Lab 08/24/20 1430 08/25/20 0046  WBC 5.0 6.1  HGB 12.7* 12.4*  HCT 39.0 35.5*  MCV 92.9 89.4  PLT 146* 518*   Basic Metabolic Panel: Recent Labs  Lab 08/23/20 1401 08/24/20 1430 08/25/20 0046 08/26/20 0128 08/26/20 1520 08/27/20 8416 08/28/20 0034  NA 135   < > 137 134* 134* 133* 132*  K 3.5   < > 3.7 3.8 4.1 3.9 4.4  CL 97*   < > 101 98 100 96* 98  CO2 26   < > 25 26 26 26 26   GLUCOSE 172*   < > 110* 96 134* 101* 93  BUN 24*   < > 29* 31* 29* 29* 27*  CREATININE 1.14   < > 1.26* 1.50* 1.31* 1.28* 1.24  CALCIUM 10.1   < > 10.3 10.1 10.1 10.1 10.1  MG 2.0  --   --  1.9  --  2.2 2.1   < > = values in this interval not displayed.   GFR: Estimated Creatinine Clearance: 48.2 mL/min (by C-G formula based on SCr of 1.24 mg/dL). Liver Function Tests: Recent Labs  Lab 08/23/20 1401 08/25/20 0046  AST 39 34  ALT 29 24  ALKPHOS  214* 218*  BILITOT 1.9* 2.0*  PROT 6.2* 6.0*  ALBUMIN 3.6 3.4*   No results for input(s): LIPASE, AMYLASE in the last 168 hours. No results for input(s): AMMONIA in the last 168 hours. Coagulation Profile: Recent Labs  Lab 08/27/20 2110  INR 4.0*   Cardiac Enzymes: No results for input(s): CKTOTAL, CKMB, CKMBINDEX, TROPONINI in the last 168 hours. BNP (last 3 results) No results for input(s): PROBNP in the last 8760 hours. HbA1C: No results for input(s): HGBA1C in the last 72 hours. CBG: No results for input(s): GLUCAP in the last 168 hours. Lipid Profile: No results for input(s): CHOL, HDL, LDLCALC, TRIG, CHOLHDL, LDLDIRECT in the last 72 hours. Thyroid Function Tests: No results for input(s): TSH, T4TOTAL, FREET4, T3FREE, THYROIDAB in the last 72 hours. Anemia Panel: No results for input(s): VITAMINB12, FOLATE, FERRITIN, TIBC, IRON, RETICCTPCT in the last 72 hours. Sepsis Labs: No results for input(s): PROCALCITON, LATICACIDVEN in the last 168 hours.  Recent Results (from the past 240 hour(s))  SARS CORONAVIRUS 2 (TAT 6-24 HRS) Nasopharyngeal Nasopharyngeal Swab     Status: None   Collection Time: 08/24/20 10:49 AM   Specimen: Nasopharyngeal Swab  Result Value Ref Range Status   SARS Coronavirus 2 NEGATIVE NEGATIVE Final    Comment: (NOTE) SARS-CoV-2 target nucleic acids are NOT DETECTED.  The SARS-CoV-2 RNA is generally detectable in upper and lower respiratory specimens during the acute phase of infection. Negative results do not preclude SARS-CoV-2 infection, do not rule out co-infections with other pathogens, and should not be used as the sole basis for treatment or other patient management decisions. Negative results must be combined with clinical observations, patient history, and epidemiological information. The expected result is Negative.  Fact Sheet for Patients: SugarRoll.be  Fact Sheet for Healthcare  Providers: https://www.woods-mathews.com/  This test is not yet approved or cleared by the [place] FDA and  has been authorized for detection and/or diagnosis of SARS-CoV-2 by FDA under an Emergency Use Authorization (EUA). This EUA will remain  in effect (meaning this test can be used) for the duration of the COVID-19 declaration under Se ction 564(b)(1) of the Act, 21 U.S.C. section 360bbb-3(b)(1), unless the authorization is terminated or revoked sooner.  Performed at [facility], [place] [address], [place], [place] [address]          Radiology Studies: No results found.      Scheduled Meds: . atorvastatin  20 mg Oral QHS  . dofetilide  250 mcg Oral BID  . doxazosin  4 mg Oral Daily  .  LORazepam  0.5 mg Oral QHS  . melatonin  5 mg Oral QHS  . metoprolol succinate  25 mg Oral QHS  . montelukast  10 mg Oral Daily  . multivitamin with minerals  1 tablet Oral Daily  . rivaroxaban  20 mg Oral Q supper  . sodium chloride flush  3 mL Intravenous Q12H   Continuous Infusions: . sodium chloride       LOS: 4 days    Time spent: 35 minutes spent on chart review, discussion with nursing staff, consultants, updating family and interview/physical exam; more than 50% of that time was spent in counseling and/or coordination of care.    Catalyna Reilly J [place] DO [facility] Available via Epic secure chat 7am-7pm After these hours, please refer to coverage provider listed on [email or web address] 08/28/2020, 10:10 AM

## 2020-08-28 NOTE — Progress Notes (Signed)
Electrophysiology Rounding Note  Patient Name: Alexander Murray, Dr. Date of Encounter: 08/28/2020  Primary Cardiologist: No primary care provider on file.  Electrophysiologist: [name], MD    Subjective   Pt converted to sinus rhythm on Tikosyn 250 mcg BID   QTc from EKG last pm shows stable QTc when corrected for paced QRS.   The patient is doing well today.  At this time, the patient denies chest pain, shortness of breath, or any new concerns.  Inpatient Medications    Scheduled Meds: . atorvastatin  20 mg Oral QHS  . dofetilide  250 mcg Oral BID  . doxazosin  4 mg Oral Daily  . LORazepam  0.5 mg Oral QHS  . melatonin  5 mg Oral QHS  . metoprolol succinate  25 mg Oral QHS  . montelukast  10 mg Oral Daily  . multivitamin with minerals  1 tablet Oral Daily  . rivaroxaban  20 mg Oral Q supper  . sodium chloride flush  3 mL Intravenous Q12H   Continuous Infusions: . sodium chloride     PRN Meds: sodium chloride, acetaminophen **OR** acetaminophen, fluticasone, HYDROcodone-homatropine, menthol-cetylpyridinium, ondansetron **OR** ondansetron (ZOFRAN) IV, oxyCODONE, polyethylene glycol, sodium chloride flush   Vital Signs    Vitals:   08/27/20 1952 08/27/20 2240 08/27/20 2322 08/28/20 0353  BP: 125/88 (!) 132/93 (!) 104/59 121/87  Pulse: 77 80 77 73  Resp: 20  16 19   Temp: (!) 97.4 F (36.3 C)  97.6 F (36.4 C) 97.9 F (36.6 C)  TempSrc: Oral  Oral Oral  SpO2: 97%  99% 92%  Weight:    75 kg  Height:        Intake/Output Summary (Last 24 hours) at 08/28/2020 0655 Last data filed at 08/28/2020 0038 Gross per 24 hour  Intake --  Output 1350 ml  Net -1350 ml   Filed Weights   08/26/20 0416 08/27/20 0351 08/28/20 0353  Weight: 74.4 kg 74.3 kg 75 kg    Physical Exam    GEN- The patient is well appearing, alert and oriented x 3 today.   Head- normocephalic, atraumatic Eyes-  Sclera clear, conjunctiva pink Ears- hearing intact Oropharynx- clear Neck-  supple Lungs- Clear to ausculation bilaterally, normal work of breathing Heart- Regular rate and rhythm, no murmurs, rubs or gallops GI- soft, NT, ND, + BS Extremities- no clubbing, cyanosis, or edema Skin- no rash or lesion Psych- euthymic mood, full affect Neuro- strength and sensation are intact  Labs    CBC No results for input(s): WBC, NEUTROABS, HGB, HCT, MCV, PLT in the last 72 hours. Basic Metabolic Panel Recent Labs    08/27/20 0213 08/28/20 0034  NA 133* 132*  K 3.9 4.4  CL 96* 98  CO2 26 26  GLUCOSE 101* 93  BUN 29* 27*  CREATININE 1.28* 1.24  CALCIUM 10.1 10.1  MG 2.2 2.1    Potassium  Date/Time Value Ref Range Status  08/28/2020 12:34 AM 4.4 3.5 - 5.1 mmol/L Final   Magnesium  Date/Time Value Ref Range Status  08/28/2020 12:34 AM 2.1 1.7 - 2.4 mg/dL Final    Comment:    Performed at [facility], [place] [address], [place], [place] [address]    Telemetry    Intermittent A-pacing/A-sensing and V pacing with HRs in 60s; Pt converted to NSR overnight on tikosyn. (personally reviewed)  Radiology    No results found.   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  They were admitted for tikosyn load.   Assessment & Plan    1. Persistent atrial fibrillation Pt remains in afib on Tikosyn 250 mcg BID  Continue Xarelto Electrolytes stable. K 4.4, Mg 2.1 CHA2DS2VASC is at least 4.  He may eventually be candidate for ablation, but tikosyn may keep in NSR without need to pursue right away.   2.Acute on chronic diastolic CHF UOP improved and Cr stable.  His peripheral edema is much improved. JVP ~7-8 cm and he still feels slightly bloated. Will repeat IV lasix @ 60 mg once more today, and plan on po dosing torsemide tomorrow.   3. HTN Stable.   4. 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   He does not have  a separate Mode Switch rate (Yesterday seemed to be intermittently pacing in 70s, but may have just been response to movement with rate response on)  5. Small cell lung cancer Currently undergoing chemotherapy. Plans per oncology.  6. AKI  Cr 1.19 ->1.26 ->1.50 -> 1.28 -> 1.24.  He responded well to IV lasix.    7. Sleep disturbance  Improved. Feels somewhat groggy this am after dose of ativan.   Pt converted on tikosyn 250. He will not require [facility].   Potentially home tomorrow if QTc and volume status remain stable.   For questions or updates, please contact [place] Please consult [email or web address] for contact info under Cardiology/STEMI.  Signed, Shirley Friar, PA-C  08/28/2020, 6:55 AM

## 2020-08-28 NOTE — Progress Notes (Signed)
Morning EKG reviewed  Shows has converted to NSR at 62 bpm (A-V dual paced) with stable QTc at ~470 ms when corrected for paced QRS  Continue Tikosyn 250 mcg BID.   Potentially home tomorrow if QT remains stable and diuresis complete.    Shirley Friar, PA-C  Pager: [phone]  08/28/2020 1:06 PM

## 2020-08-28 NOTE — Progress Notes (Signed)
Patient is in stable condition upon entering room, sitting up in chair.  Meds given PO and tolerated well with water.  Patient states he does not have any pain. Patient alert and oriented X 4.  Tolerated meds well.  Eating breakfast upon leaving the room.

## 2020-08-29 ENCOUNTER — Other Ambulatory Visit (HOSPITAL_COMMUNITY): Payer: Self-pay | Admitting: Student

## 2020-08-29 LAB — PROTIME-INR
INR: 6.5 (ref 0.8–1.2)
Prothrombin Time: 55.4 seconds — ABNORMAL HIGH (ref 11.4–15.2)

## 2020-08-29 LAB — BASIC METABOLIC PANEL
Anion gap: 9 (ref 5–15)
BUN: 28 mg/dL — ABNORMAL HIGH (ref 8–23)
CO2: 26 mmol/L (ref 22–32)
Calcium: 10.1 mg/dL (ref 8.9–10.3)
Chloride: 99 mmol/L (ref 98–111)
Creatinine, Ser: 1.38 mg/dL — ABNORMAL HIGH (ref 0.61–1.24)
GFR, Estimated: 51 mL/min — ABNORMAL LOW (ref >60)
Glucose, Bld: 89 mg/dL (ref 70–99)
Potassium: 4.4 mmol/L (ref 3.5–5.1)
Sodium: 134 mmol/L — ABNORMAL LOW (ref 135–145)

## 2020-08-29 LAB — MAGNESIUM: Magnesium: 2.2 mg/dL (ref 1.7–2.4)

## 2020-08-29 MED ORDER — METOPROLOL SUCCINATE ER 25 MG PO TB24: 12.5000 mg | ORAL_TABLET | Freq: Every day | ORAL | 3 refills | Status: DC

## 2020-08-29 MED ORDER — POTASSIUM CHLORIDE CRYS ER 20 MEQ PO TBCR: 40.0000 meq | EXTENDED_RELEASE_TABLET | Freq: Every day | ORAL | 6 refills | Status: DC

## 2020-08-29 MED ORDER — DOFETILIDE 250 MCG PO CAPS: 250.0000 ug | ORAL_CAPSULE | Freq: Two times a day (BID) | ORAL | 6 refills | Status: DC

## 2020-08-29 MED ORDER — TORSEMIDE 20 MG PO TABS: 20.0000 mg | ORAL_TABLET | Freq: Every day | ORAL | 2 refills | Status: DC

## 2020-08-29 MED FILL — DOFETILIDE 250 MCG CAPS: 250 | 30 days supply | Qty: 60 | Fill #0 | Status: TO

## 2020-08-29 NOTE — Progress Notes (Signed)
Pharmacy: Dofetilide (Tikosyn) - Follow Up Assessment and Electrolyte Replacement  Pharmacy consulted to assist in monitoring and replacing electrolytes in this 80 y.o. male admitted on 08/24/2020 undergoing dofetilide initiation. First dofetilide dose: 250 mcg BID - started 1/24 @ 8 pm.  Labs:    Component Value Date/Time   K 4.4 08/29/2020 0229   MG 2.2 08/29/2020 0229     Plan: Potassium: K >/= 4: No additional supplementation needed  Magnesium: Mg > 2: No additional supplementation needed   As patient has required on average 40-80 mEq of potassium replacement every day, recommend discharging patient with prescription for: ~ 60 meq daily as long as resuming same PTA torsemide dose (40 daily)  Thank you for allowing pharmacy to participate in this patient's care   [place] Crane, Roylene Reason, [place] Pharmacist  08/29/2020 7:54 AM   [facility] pharmacy phone numbers are listed on [email or web address]

## 2020-08-29 NOTE — Progress Notes (Signed)
CRITICAL VALUE STICKER  CRITICAL VALUE: INR 6.5  RECEIVER (on-site recipient of call): Levada Dy RN  DATE & TIME NOTIFIED: 08/29/20 0356  MESSENGER (representative from lab): [place] NOTIFIED: Jeneen Rinks  TIME OF NOTIFICATION:0405  RESPONSE: Pt takes XARELTO;  INR Value is not valid

## 2020-08-29 NOTE — Plan of Care (Signed)
  Problem: Education: Goal: Knowledge of General Education information will improve Description: Including pain rating scale, medication(s)/side effects and non-pharmacologic comfort measures Outcome: Adequate for Discharge   

## 2020-08-29 NOTE — Discharge Summary (Signed)
Physician Discharge Summary  Alexander Murray, Dr. JSH:[id number] DOB: [birth year] DOA: 08/24/2020  PCP: [name], MD  Admit date: 08/24/2020 Discharge date: 08/29/2020  Admitted From: Home Disposition: Home  Recommendations for Outpatient Follow-up:  1. Follow up with PCP in 1-2 weeks 2. Follow-up with cardiology, A. fib clinic as scheduled on 09/04/2020 at 09000 3. Started on Tikosyn 25 mg p.o. twice daily 4. Decreased metoprolol succinate to 12.5 mg daily 5. Decrease torsemide to 20 mg p.o. daily 6. Please obtain BMP in one week  Home Health: No Equipment/Devices: None  Discharge Condition: Stable CODE STATUS: Full code Diet recommendation: Heart healthy diet  History of present illness:  Alexander Murray, Dr. 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  Symptoms of dyspnea have been progressing over the last 2-3 days with increased lower extremity edema. Also, patient reports decreased appetite given that he feels full with some abdominal distention which he relates to fluid overload.  Additionally, patient reports that he fell in his bathroom after feeling dizzy but denies any loss of consciousness or syncopal episode.    Recent cardioversions, last being 5 days ago for his atrial fibrillation.  Symptoms worse with exertion and improved with rest.  Recently had medication changes by cardiology with discontinuation of Cardizem and furosemide and started torsemide and metoprolol.  Patient reports that he does not believe he has had adequate urine output with the torsemide.  He was also instructed recently to  stop his flecainide with planned transition to Tikosyn.  Patient reports last dose of flecainide was the morning of 08/23/2020.  In the ED, temperature 97.6, HR 58-73, RR 12, BP 105/73, SPO2 99% on room air.  Sodium 136, potassium 3.7, chloride 99, CO2 24, glucose 122, BUN 27, creatinine 1.19.  WBC 5.0, hemoglobin 12.7, platelets 146.  SARS-CoV-2/COVID-19 negative.  CXR with no acute cardiopulmonary disease identified.  EKG notable for V paced rhythm, rate 61.  Cardiology was consulted by EDP for admission, deferred to hospitalist admission and Dr. Radford Pax stated EP will follow patient in the a.m.  Hospital course:  Acute on chronic diastolic congestive heart failure Patient presenting with progressive shortness of breath, recent changes by cardiology clinic with transition of furosemide to torsemide.  Patient continues with lower extremity edema. Elevated BNP of 976.  Chest x-ray with no acute cardiopulmonary disease process.  While inpatient, patient received IV diuresis with good urine output.  Net negative 3.8 L during hospitalization with discharge weight 73.8 kg.  Continue torsemide 20 mg p.o. daily at home.  Monitor daily weights.  Permanent atrial fibrillation Patient follows with cardiology/EP Dr. Caryl Comes outpatient.  Recent cardioversion 08/19/2020.  Patient was previously on flecainide, which has been discontinued with last dose reported on 08/23/2020 with planned initiation of Tikosyn.  EKG with V paced rhythm, rate 61.  TSH 5.630 with a free T4 of 1.11.  Electrophysiology followed during the hospital course.  Patient's metoprolol succinate was reduced to 12.5 mg p.o. daily.  Patient was started on Tikosyn 250 mg p.o. twice daily with conversion of his rhythm.  Continue Xarelto 20 mg p.o. daily for anticoagulation.  Outpatient follow-up with atrial fibrillation clinic scheduled on 09/04/2020 at 0900.  HLD: Continue atorvastatin  20 mg p.o. daily  Hx essential hypertension: Patient with recent  discontinuation of Cardizem.  Patient reports that his blood pressures have been low normal as of recent, he relates it to his diagnosis of atrial fibrillation. Continue metoprolol succinate 12.5 mg p.o. daily  Small cell lung cancer Patient follows with medical oncology, Dr. Earlie Server.  Reports completed chemotherapy in November 2021.  Continue outpatient follow-up with oncology.  Discharge Diagnoses:  Active Problems:   S/P partial lobectomy of lung   Small cell lung cancer, right lower lobe ([name])   Persistent atrial fibrillation ([name])   CHF (congestive heart failure) [facility])    Discharge Instructions  Discharge Instructions    Call MD for:  difficulty breathing, headache or visual disturbances   Complete by: As directed    Call MD for:  extreme fatigue   Complete by: As directed    Call MD for:  persistant dizziness or light-headedness   Complete by: As directed    Call MD for:  persistant nausea and vomiting   Complete by: As directed    Call MD for:  severe uncontrolled pain   Complete by: As directed    Call MD for:  temperature >100.4   Complete by: As directed    Diet - low sodium heart healthy   Complete by: As directed    Increase activity slowly   Complete by: As directed      Allergies as of 08/29/2020      Reactions   Lisinopril Palpitations, Other (See Comments)   Dizziness, also      Medication List    TAKE these medications   acetaminophen 500 MG tablet Commonly known as: TYLENOL Take 500-1,000 mg by mouth every 6 (six) hours as needed (for pain).   acetaminophen 325 MG tablet Commonly known as: TYLENOL Take 2 tablets (650 mg total) by mouth every 6 (six) hours as needed for mild pain or moderate pain.   atorvastatin 20 MG tablet Commonly known as: LIPITOR Take 1 tablet (20 mg total) by mouth daily. What changed: when to take this   celecoxib 200 MG capsule Commonly known as: CELEBREX Take 1 capsule (200 mg total) by mouth 2 (two) times daily  as needed for mild pain.   diazepam 5 MG tablet Commonly known as: VALIUM Take 5 mg by mouth at bedtime as needed (for sleep).   dofetilide 250 MCG capsule Commonly known as: TIKOSYN Take 1 capsule (250 mcg total) by mouth 2 (two) times daily.   doxazosin 4 MG tablet Commonly known as: CARDURA Take 4 mg by mouth daily.   Dry Eye Relief Drops 0.2-0.2-1 % Soln Generic drug: Glycerin-Hypromellose-PEG 400 Place 1 drop into both eyes daily as needed (for dryness). Saline   esomeprazole 20 MG capsule Commonly known as: NEXIUM Take 20 mg by mouth daily before breakfast.   fluticasone 50 MCG/ACT nasal spray Commonly known as: FLONASE Place 1 spray into both nostrils daily as needed for allergies or rhinitis.   GLUCOSAMINE CHONDROITIN ADV PO Take 1,500 mg by mouth at bedtime.   HYDROcodone-homatropine 5-1.5 MG/5ML syrup Commonly known as: HYCODAN Take 5 mLs by mouth every 6 (six) hours as needed for cough.   melatonin 3 MG Tabs tablet Take 3 mg by mouth at bedtime as needed (for sleep).   metoprolol succinate 25 MG 24 hr tablet Commonly known as: Toprol XL Take 0.5 tablets (12.5 mg total) by mouth at bedtime. What changed: how much to take   montelukast 10 MG  tablet Commonly known as: SINGULAIR Take 10 mg by mouth every morning.   multivitamin capsule Take 1 capsule by mouth daily.   potassium chloride SA 20 MEQ tablet Commonly known as: KLOR-CON Take 2 tablets (40 mEq total) by mouth daily. Take an extra tablet (20 meq) anytime you take an extra torsemide What changed:   how much to take  additional instructions   PRESERVISION AREDS 2 PO Take 1 capsule by mouth in the morning and at bedtime.   rivaroxaban 20 MG Tabs tablet Commonly known as: XARELTO Take 1 tablet (20 mg total) by mouth daily with supper.   temazepam 15 MG capsule Commonly known as: RESTORIL Take 15 mg by mouth at bedtime as needed for sleep.   torsemide 20 MG tablet Commonly known as:  DEMADEX Take 1 tablet (20 mg total) by mouth daily. Take extra 20 mg as needed for weight gain of 3 lbs overnight or 5 lbs within one week. What changed:   how much to take  additional instructions   vitamin C 500 MG tablet Commonly known as: ASCORBIC ACID Take 500 mg by mouth at bedtime.       Follow-up Information    [facility] Follow up.   Specialty: Cardiology Why: on 2/2 at 0900 for post hospital tikosyn follow up Contact information: [address] [id number] [place] [address] [phone]             Allergies  Allergen Reactions  . Lisinopril Palpitations and Other (See Comments)    Dizziness, also    Consultations:  Cardiology/electrophysiology   Procedures/Studies: DG Chest 2 View  Result Date: 08/24/2020 CLINICAL DATA:  Shortness of breath.  Status post fall. EXAM: CHEST - 2 VIEW COMPARISON:  August 06, 2020, March 11, 2020 FINDINGS: The heart size and mediastinal contours are stable. Cardiac pacemaker is unchanged. Mild increased pulmonary interstitium is identified bilaterally unchanged compared to March 11, 2020, chronic. Minimal bilateral pleural effusions are noted. No focal pneumonia is noted. The visualized skeletal structures are stable. IMPRESSION: No acute cardiopulmonary disease identified. Electronically Signed   By: Abelardo Diesel M.D.   On: 08/24/2020 14:59   DG Chest 2 View  Result Date: 08/06/2020 CLINICAL DATA:  Shortness of breath EXAM: CHEST - 2 VIEW COMPARISON:  06/26/2020, CT chest 06/26/2020 FINDINGS: Left-sided pacing device as before. No focal opacity or pleural effusion. Stable cardiomediastinal silhouette with aortic atherosclerosis. Chronic elevation right diaphragm. Pericardial calcifications as before. No pneumothorax. IMPRESSION: No active cardiopulmonary disease. Electronically Signed   By: Donavan Foil M.D.   On: 08/06/2020 15:30   ECHOCARDIOGRAM COMPLETE  Result Date:  08/14/2020    ECHOCARDIOGRAM REPORT   Patient Name:   ALLENMICHAEL MCPARTLIN Date of Exam: 08/14/2020 Medical Rec #:  [id number]      Height:       70.0 in Accession #:    [id number]     Weight:       170.0 lb Date of Birth:  January 19, 1941       BSA:          1.948 m Patient Age:    38 years       BP:           118/82 mmHg Patient Gender: M              HR:           82 bpm. Exam Location:  Inpatient Procedure: 2D Echo, Cardiac Doppler and Color  Doppler Indications:    Atrial Fibrillation I48.91  History:        Patient has prior history of Echocardiogram examinations, most                 recent 12/10/2014. Pacemaker, Stroke; Risk Factors:Former Smoker                 and Hypertension. GERD.  Sonographer:    Vickie Epley RDCS Referring Phys: [id number] [place]  1. Left ventricular ejection fraction, by estimation, is 60 to 65%. The left ventricle has normal function. Left ventricular endocardial border not optimally defined to evaluate regional wall motion. Left ventricular diastolic parameters are indeterminate.  2. Right ventricular systolic function is mildly reduced. The right ventricular size is mildly enlarged. There is normal pulmonary artery systolic pressure. The estimated right ventricular systolic pressure is 42.5 mmHg.  3. Left atrial size was moderately dilated.  4. Right atrial size was mild to moderately dilated.  5. The mitral valve is degenerative. Trivial mitral valve regurgitation. No evidence of mitral stenosis. Moderate mitral annular calcification.  6. The aortic valve is abnormal. There is moderate calcification of the aortic valve with fixed left coronary cusp and relatively preserved motion of the non-coronary cusp and right coronary cusp. Aortic valve regurgitation is not visualized. Moderate aortic valve sclerosis/calcification is present, without any evidence of aortic stenosis, however Doppler interrogation only performed in one location.  7. Aortic dilatation noted. There is mild to  moderate dilatation of the ascending aorta, measuring 44 mm.  8. The inferior vena cava is dilated in size with <50% respiratory variability, suggesting right atrial pressure of 15 mmHg. FINDINGS  Left Ventricle: Left ventricular ejection fraction, by estimation, is 60 to 65%. The left ventricle has normal function. Left ventricular endocardial border not optimally defined to evaluate regional wall motion. The left ventricular internal cavity size was normal in size. There is no left ventricular hypertrophy. Left ventricular diastolic parameters are indeterminate. Right Ventricle: The right ventricular size is mildly enlarged. No increase in right ventricular wall thickness. Right ventricular systolic function is mildly reduced. There is normal pulmonary artery systolic pressure. The tricuspid regurgitant velocity  is 2.09 m/s, and with an assumed right atrial pressure of 15 mmHg, the estimated right ventricular systolic pressure is 95.6 mmHg. Left Atrium: Left atrial size was moderately dilated. Volumes are underestimated. Right Atrium: Right atrial size was mild to moderately dilated. Pericardium: There is no evidence of pericardial effusion. Presence of pericardial fat pad. Mitral Valve: The mitral valve is degenerative in appearance. Moderate mitral annular calcification. Trivial mitral valve regurgitation. No evidence of mitral valve stenosis. Tricuspid Valve: The tricuspid valve is normal in structure. Tricuspid valve regurgitation is trivial. No evidence of tricuspid stenosis. Aortic Valve: The aortic valve is abnormal. There is moderate calcification of the aortic valve. Aortic valve regurgitation is not visualized. Mild to moderate aortic valve sclerosis/calcification is present, without any evidence of aortic stenosis. Pulmonic Valve: The pulmonic valve was not well visualized. Pulmonic valve regurgitation is trivial. No evidence of pulmonic stenosis. Aorta: Aortic dilatation noted. There is mild to  moderate dilatation of the ascending aorta, measuring 44 mm. Venous: The inferior vena cava is dilated in size with less than 50% respiratory variability, suggesting right atrial pressure of 15 mmHg. IAS/Shunts: The interatrial septum was not well visualized. Additional Comments: A pacer wire is visualized in the right ventricle.  LEFT VENTRICLE PLAX 2D LVIDd:  4.10 cm LVIDs:         3.20 cm LV PW:         0.90 cm LV IVS:        0.90 cm LVOT diam:     2.30 cm LV SV:         51 LV SV Index:   26 LVOT Area:     4.15 cm  RIGHT VENTRICLE TAPSE (M-mode): 0.7 cm LEFT ATRIUM           Index       RIGHT ATRIUM           Index LA diam:      4.90 cm 2.52 cm/m  RA Area:     22.80 cm LA Vol (A2C): 35.6 ml 18.28 ml/m RA Volume:   68.60 ml  35.22 ml/m LA Vol (A4C): 56.0 ml 28.75 ml/m  AORTIC VALVE LVOT Vmax:   60.40 cm/s LVOT Vmean:  41.400 cm/s LVOT VTI:    0.123 m  AORTA Ao Root diam: 3.60 cm Ao Asc diam:  4.40 cm TRICUSPID VALVE TR Peak grad:   17.5 mmHg TR Vmax:        209.00 cm/s  SHUNTS Systemic VTI:  0.12 m Systemic Diam: 2.30 cm Cherlynn Kaiser MD Electronically signed by Cherlynn Kaiser MD Signature Date/Time: 08/14/2020/9:36:10 AM    Final       Subjective: Patient seen and examined at bedside, resting comfortably.  No complaints.  Okay for discharge with outpatient follow-up per cardiology.  Denies headache, no fever/chills/night sweats, no chest pain, no palpitations, no shortness of breath, no abdominal pain.  No acute events overnight per nursing staff.  Discharge Exam: Vitals:   08/29/20 0832 08/29/20 1231  BP: 123/76 107/78  Pulse: 68 71  Resp: (!) 21 20  Temp: 97.7 F (36.5 C) (!) 97.4 F (36.3 C)  SpO2: 98% 93%   Vitals:   08/29/20 0351 08/29/20 0650 08/29/20 0832 08/29/20 1231  BP: 101/68  123/76 107/78  Pulse: 63  68 71  Resp: 16  (!) 21 20  Temp: 98.2 F (36.8 C)  97.7 F (36.5 C) (!) 97.4 F (36.3 C)  TempSrc: Oral  Oral Oral  SpO2: 95%  98% 93%  Weight:  73.8 kg     Height:        General: Pt is alert, awake, not in acute distress Cardiovascular: RRR, S1/S2 +, no rubs, no gallops Respiratory: CTA bilaterally, no wheezing, no rhonchi Abdominal: Soft, NT, ND, bowel sounds + Extremities: no edema, no cyanosis    The results of significant diagnostics from this hospitalization (including imaging, microbiology, ancillary and laboratory) are listed below for reference.     Microbiology: Recent Results (from the past 240 hour(s))  SARS CORONAVIRUS 2 (TAT 6-24 HRS) Nasopharyngeal Nasopharyngeal Swab     Status: None   Collection Time: 08/24/20 10:49 AM   Specimen: Nasopharyngeal Swab  Result Value Ref Range Status   SARS Coronavirus 2 NEGATIVE NEGATIVE Final    Comment: (NOTE) SARS-CoV-2 target nucleic acids are NOT DETECTED.  The SARS-CoV-2 RNA is generally detectable in upper and lower respiratory specimens during the acute phase of infection. Negative results do not preclude SARS-CoV-2 infection, do not rule out co-infections with other pathogens, and should not be used as the sole basis for treatment or other patient management decisions. Negative results must be combined with clinical observations, patient history, and epidemiological information. The expected result is Negative.  Fact Sheet for Patients: SugarRoll.be  Fact Sheet for Healthcare Providers: https://www.woods-mathews.com/  This test is not yet approved or cleared by the [place] FDA and  has been authorized for detection and/or diagnosis of SARS-CoV-2 by FDA under an Emergency Use Authorization (EUA). This EUA will remain  in effect (meaning this test can be used) for the duration of the COVID-19 declaration under Se ction 564(b)(1) of the Act, 21 U.S.C. section 360bbb-3(b)(1), unless the authorization is terminated or revoked sooner.  Performed at [facility], [place] [address] [place], [place] [address]       Labs: BNP (last 3 results) Recent Labs    08/25/20 0046  BNP 564.3*   Basic Metabolic Panel: Recent Labs  Lab 08/23/20 1401 08/24/20 1430 08/26/20 0128 08/26/20 1520 08/27/20 0213 08/28/20 0034 08/29/20 0229  NA 135   < > 134* 134* 133* 132* 134*  K 3.5   < > 3.8 4.1 3.9 4.4 4.4  CL 97*   < > 98 100 96* 98 99  CO2 26   < > 26 26 26 26 26   GLUCOSE 172*   < > 96 134* 101* 93 89  BUN 24*   < > 31* 29* 29* 27* 28*  CREATININE 1.14   < > 1.50* 1.31* 1.28* 1.24 1.38*  CALCIUM 10.1   < > 10.1 10.1 10.1 10.1 10.1  MG 2.0  --  1.9  --  2.2 2.1 2.2   < > = values in this interval not displayed.   Liver Function Tests: Recent Labs  Lab 08/23/20 1401 08/25/20 0046  AST 39 34  ALT 29 24  ALKPHOS 214* 218*  BILITOT 1.9* 2.0*  PROT 6.2* 6.0*  ALBUMIN 3.6 3.4*   No results for input(s): LIPASE, AMYLASE in the last 168 hours. No results for input(s): AMMONIA in the last 168 hours. CBC: Recent Labs  Lab 08/24/20 1430 08/25/20 0046  WBC 5.0 6.1  HGB 12.7* 12.4*  HCT 39.0 35.5*  MCV 92.9 89.4  PLT 146* 130*   Cardiac Enzymes: No results for input(s): CKTOTAL, CKMB, CKMBINDEX, TROPONINI in the last 168 hours. BNP: Invalid input(s): POCBNP CBG: No results for input(s): GLUCAP in the last 168 hours. D-Dimer No results for input(s): DDIMER in the last 72 hours. Hgb A1c No results for input(s): HGBA1C in the last 72 hours. Lipid Profile No results for input(s): CHOL, HDL, LDLCALC, TRIG, CHOLHDL, LDLDIRECT in the last 72 hours. Thyroid function studies No results for input(s): TSH, T4TOTAL, T3FREE, THYROIDAB in the last 72 hours.  Invalid input(s): FREET3 Anemia work up No results for input(s): VITAMINB12, FOLATE, FERRITIN, TIBC, IRON, RETICCTPCT in the last 72 hours. Urinalysis    Component Value Date/Time   COLORURINE YELLOW 03/08/2020 1330   APPEARANCEUR CLEAR 03/08/2020 1330   LABSPEC 1.011 03/08/2020 1330   PHURINE 7.0 03/08/2020 1330   GLUCOSEU NEGATIVE  03/08/2020 1330   HGBUR NEGATIVE 03/08/2020 1330   BILIRUBINUR NEGATIVE 03/08/2020 1330   KETONESUR NEGATIVE 03/08/2020 1330   PROTEINUR NEGATIVE 03/08/2020 1330   UROBILINOGEN 1.0 05/17/2015 0858   NITRITE NEGATIVE 03/08/2020 1330   LEUKOCYTESUR NEGATIVE 03/08/2020 1330   Sepsis Labs Invalid input(s): PROCALCITONIN,  WBC,  LACTICIDVEN Microbiology Recent Results (from the past 240 hour(s))  SARS CORONAVIRUS 2 (TAT 6-24 HRS) Nasopharyngeal Nasopharyngeal Swab     Status: None   Collection Time: 08/24/20 10:49 AM   Specimen: Nasopharyngeal Swab  Result Value Ref Range Status   SARS Coronavirus 2 NEGATIVE NEGATIVE Final    Comment: (  NOTE) SARS-CoV-2 target nucleic acids are NOT DETECTED.  The SARS-CoV-2 RNA is generally detectable in upper and lower respiratory specimens during the acute phase of infection. Negative results do not preclude SARS-CoV-2 infection, do not rule out co-infections with other pathogens, and should not be used as the sole basis for treatment or other patient management decisions. Negative results must be combined with clinical observations, patient history, and epidemiological information. The expected result is Negative.  Fact Sheet for Patients: SugarRoll.be  Fact Sheet for Healthcare Providers: https://www.woods-mathews.com/  This test is not yet approved or cleared by the [place] FDA and  has been authorized for detection and/or diagnosis of SARS-CoV-2 by FDA under an Emergency Use Authorization (EUA). This EUA will remain  in effect (meaning this test can be used) for the duration of the COVID-19 declaration under Se ction 564(b)(1) of the Act, 21 U.S.C. section 360bbb-3(b)(1), unless the authorization is terminated or revoked sooner.  Performed at [facility], [place] [address] [place], [place] [address]      Time coordinating discharge: Over 30 minutes  SIGNED:   Donnamarie Poag [place]  DO  [facility] 08/29/2020, 1:21 PM

## 2020-08-29 NOTE — Care Management Important Message (Signed)
Important Message  Patient Details  Name: Alexander Murray, Dr. MRN: [id number] Date of Birth: 02-Mar-1941   Medicare Important Message Given:  Yes     Shelda Altes 08/29/2020, 12:38 PM

## 2020-08-29 NOTE — Plan of Care (Signed)
  Problem: Education: Goal: Knowledge of General Education information will improve Description: Including pain rating scale, medication(s)/side effects and non-pharmacologic comfort measures 08/29/2020 1432 by Roma Kayser, RN Outcome: Adequate for Discharge 08/29/2020 1431 by Roma Kayser, RN Outcome: Adequate for Discharge

## 2020-08-29 NOTE — Plan of Care (Signed)

## 2020-08-29 NOTE — Progress Notes (Signed)
Electrophysiology Rounding Note  Patient Name: Alexander Murray, Dr. Date of Encounter: 08/29/2020  Primary Cardiologist: No primary care provider on file.  Electrophysiologist: [name], MD    Subjective   Pt remains in NSR on Tikosyn 250 mcg BID   QTc from EKG last pm shows stable QTc at ~460 ms or better when corrected for paced QRS.  The patient is doing well today.  At this time, the patient denies chest pain, shortness of breath, or any new concerns. He feels his swelling has resolved. He is sleeping well with melatonin alone.   Inpatient Medications    Scheduled Meds: . atorvastatin  20 mg Oral QHS  . dofetilide  250 mcg Oral BID  . doxazosin  4 mg Oral Daily  . LORazepam  0.5 mg Oral QHS  . melatonin  5 mg Oral QHS  . metoprolol succinate  25 mg Oral QHS  . montelukast  10 mg Oral Daily  . multivitamin with minerals  1 tablet Oral Daily  . pantoprazole  40 mg Oral Daily  . rivaroxaban  20 mg Oral Q supper  . sodium chloride flush  3 mL Intravenous Q12H   Continuous Infusions: . sodium chloride     PRN Meds: sodium chloride, acetaminophen **OR** acetaminophen, fluticasone, HYDROcodone-homatropine, menthol-cetylpyridinium, ondansetron **OR** ondansetron (ZOFRAN) IV, oxyCODONE, polyethylene glycol, sodium chloride flush   Vital Signs    Vitals:   08/28/20 2143 08/28/20 2345 08/29/20 0351 08/29/20 0650  BP: 107/80 95/67 101/68   Pulse: 68 64 63   Resp: 20 18 16    Temp: 97.9 F (36.6 C) 97.6 F (36.4 C) 98.2 F (36.8 C)   TempSrc: Oral Oral Oral   SpO2: 96% 94% 95%   Weight:    73.8 kg  Height:        Intake/Output Summary (Last 24 hours) at 08/29/2020 0826 Last data filed at 08/29/2020 5188 Gross per 24 hour  Intake 600 ml  Output 1125 ml  Net -525 ml   Filed Weights   08/27/20 0351 08/28/20 0353 08/29/20 0650  Weight: 74.3 kg 75 kg 73.8 kg    Physical Exam    GEN- The patient is well appearing, alert and oriented x 3 today.   Head-  normocephalic, atraumatic Eyes-  Sclera clear, conjunctiva pink Ears- hearing intact Oropharynx- clear Neck- supple Lungs- Clear to ausculation bilaterally, normal work of breathing Heart- Regular rate and rhythm, no murmurs, rubs or gallops GI- soft, NT, ND, + BS Extremities- no clubbing, cyanosis, or edema Skin- no rash or lesion Psych- euthymic mood, full affect Neuro- strength and sensation are intact  Labs    CBC No results for input(s): WBC, NEUTROABS, HGB, HCT, MCV, PLT in the last 72 hours. Basic Metabolic Panel Recent Labs    08/28/20 0034 08/29/20 0229  NA 132* 134*  K 4.4 4.4  CL 98 99  CO2 26 26  GLUCOSE 93 89  BUN 27* 28*  CREATININE 1.24 1.38*  CALCIUM 10.1 10.1  MG 2.1 2.2    Potassium  Date/Time Value Ref Range Status  08/29/2020 02:29 AM 4.4 3.5 - 5.1 mmol/L Final   Magnesium  Date/Time Value Ref Range Status  08/29/2020 02:29 AM 2.2 1.7 - 2.4 mg/dL Final    Comment:    Performed at [name], [place] [address], [place], [place] [address]    Telemetry    A sensed V paced 60s (personally reviewed)  Radiology    No results found.  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  They were admitted for tikosyn load.   Assessment & Plan    1. Persistent atrial fibrillation Pt converted to sinus rhythm on Tikosyn 250 mcg BID  Continue Xarelto Electrolytes stable.  CHA2DS2VASC is at least 4.  2.Acute on chronic diastolic CHF Cr slightly bump today.  He had only switched to torsemide last week prior to his admission. I think now in NSR that 40 mg of torsemide may be too much.  We will plan on resuming torsemide 20 mg daily with Potassium 40 meq daily tomorrow, with sliding scale diuretics of 20 mg extra torsemide for weight gain of 3 lbs overnight or 5 lbs within one week.  He should take an additional 20 meq of K any time he takes an extra  torsemide.   3. HTN Stable to soft. His toprol has been held multiple nights. Will use lower dose for home.   4. 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   5. Small cell lung cancer He is finished with chemotherapy. Plans per oncology.  6. AKI  Cr 1.19 ->1.26 ->1.50->1.28 -> 1.24.  He responded well to IV lasix.    7. Sleep disturbance Improved. Feels somewhat groggy this am after dose of ativan.   Meds for home (pending EKG) Torsemide 20 mg daily Potassium 40 meq daily Tikosyn 250 mcg BID (I will order from [facility]) Metoprolol 12.5 mg qhs.   For questions or updates, please contact [place] Please consult [email or web address] for contact info under Cardiology/STEMI.  Signed, Shirley Friar, PA-C  08/29/2020, 8:26 AM

## 2020-08-29 NOTE — Progress Notes (Signed)
Discharge instructions (including medications) discussed with and copy provided to patient/caregiver 

## 2020-08-30 ENCOUNTER — Ambulatory Visit (HOSPITAL_COMMUNITY): Payer: Medicare Other | Admitting: Physician Assistant

## 2020-09-03 ENCOUNTER — Ambulatory Visit (HOSPITAL_COMMUNITY): Payer: Medicare Other | Admitting: Physician Assistant

## 2020-09-04 ENCOUNTER — Other Ambulatory Visit: Payer: Self-pay

## 2020-09-04 ENCOUNTER — Other Ambulatory Visit (HOSPITAL_COMMUNITY): Payer: Self-pay | Admitting: *Deleted

## 2020-09-04 ENCOUNTER — Ambulatory Visit (HOSPITAL_COMMUNITY)
Admission: RE | Admit: 2020-09-04 | Discharge: 2020-09-04 | Disposition: A | Discharge status: Home / Self Care | Payer: Medicare Other | Source: Ambulatory Visit | Attending: Physician Assistant | Admitting: Physician Assistant

## 2020-09-04 VITALS — BP 128/76 | HR 71 | Ht 70.0 in | Wt 164.6 lb

## 2020-09-04 DIAGNOSIS — I10 Essential (primary) hypertension: Secondary | ICD-10-CM | POA: Diagnosis not present

## 2020-09-04 DIAGNOSIS — D6869 Other thrombophilia: Secondary | ICD-10-CM | POA: Diagnosis not present

## 2020-09-04 DIAGNOSIS — Z87891 Personal history of nicotine dependence: Secondary | ICD-10-CM | POA: Insufficient documentation

## 2020-09-04 DIAGNOSIS — I4819 Other persistent atrial fibrillation: Secondary | ICD-10-CM

## 2020-09-04 DIAGNOSIS — Z950 Presence of cardiac pacemaker: Secondary | ICD-10-CM | POA: Insufficient documentation

## 2020-09-04 DIAGNOSIS — I5032 Chronic diastolic (congestive) heart failure: Secondary | ICD-10-CM | POA: Diagnosis not present

## 2020-09-04 DIAGNOSIS — Z8249 Family history of ischemic heart disease and other diseases of the circulatory system: Secondary | ICD-10-CM | POA: Diagnosis not present

## 2020-09-04 DIAGNOSIS — C3490 Malignant neoplasm of unspecified part of unspecified bronchus or lung: Secondary | ICD-10-CM | POA: Diagnosis not present

## 2020-09-04 DIAGNOSIS — Z79899 Other long term (current) drug therapy: Secondary | ICD-10-CM | POA: Diagnosis not present

## 2020-09-04 DIAGNOSIS — Z7901 Long term (current) use of anticoagulants: Secondary | ICD-10-CM | POA: Insufficient documentation

## 2020-09-04 DIAGNOSIS — Z888 Allergy status to other drugs, medicaments and biological substances status: Secondary | ICD-10-CM | POA: Diagnosis not present

## 2020-09-04 DIAGNOSIS — I441 Atrioventricular block, second degree: Secondary | ICD-10-CM | POA: Insufficient documentation

## 2020-09-04 LAB — BASIC METABOLIC PANEL
Anion gap: 9 (ref 5–15)
BUN: 18 mg/dL (ref 8–23)
CO2: 29 mmol/L (ref 22–32)
Calcium: 10 mg/dL (ref 8.9–10.3)
Chloride: 102 mmol/L (ref 98–111)
Creatinine, Ser: 1 mg/dL (ref 0.61–1.24)
GFR, Estimated: >60 mL/min (ref >60)
Glucose, Bld: 95 mg/dL (ref 70–99)
Potassium: 3.6 mmol/L (ref 3.5–5.1)
Sodium: 140 mmol/L (ref 135–145)

## 2020-09-04 LAB — MAGNESIUM: Magnesium: 2.2 mg/dL (ref 1.7–2.4)

## 2020-09-04 MED ORDER — DOFETILIDE 250 MCG PO CAPS: 250.0000 ug | ORAL_CAPSULE | Freq: Two times a day (BID) | ORAL | 6 refills | Status: AC

## 2020-09-04 MED ORDER — POTASSIUM CHLORIDE CRYS ER 20 MEQ PO TBCR: 40.0000 meq | EXTENDED_RELEASE_TABLET | Freq: Two times a day (BID) | ORAL | 6 refills | Status: DC

## 2020-09-04 NOTE — Progress Notes (Signed)
Due to national recommendations of social distancing due to [place] 19, Audio/video telehealth visit is felt to be most appropriate for this patient at this time.  See consent below from today for patient consent regarding telehealth for the Atrial Fibrillation Clinic.    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 The patient previously had subclinical AF detected on his device but became persistent and was started on Xarelto for a CHADS2VASC score of 6. He was not able to undergo TEE guided DCCV 2/2 his esophageal strictures. After 3 weeks of anticoagulation he underwent DCCV on 07/29/20. Echo showed preserved EF and he was started on flecainide.  Patient is s/p DCCV on 08/19/20. Patient reports that he has had abdominal and scrotal swelling and SOB since the procedure. His weight stayed the same despite increased diuretics and he was back in afib. His diuretics were adjusted with plans to be admitted for dofetilide loading.   On follow up today, patient present to the ED 08/24/20 with acute on chronic diastolic CHF. He was diuresed with IV lasix and started on dofetilide which converted him to SR. He reports that his SOB has greatly improved and his weight is trending down. He denies any bleeding issues on anticoagulation. He does report frequent issues with insomnia and daytime somnolence.  Today, he denies symptoms of palpitations, chest pain, orthopnea, PND, dizziness, presyncope, syncope, snoring, bleeding, or neurologic  sequela. The patient is tolerating medications without difficulties and is otherwise without complaint today.    Atrial Fibrillation Risk Factors:  he does not have symptoms or diagnosis of sleep apnea. he does not have a history of rheumatic fever.   he has a BMI of Body mass index is 23.62 kg/m.Marland Kitchen Filed Weights   09/04/20 0916  Weight: 74.7 kg    Family History  Problem Relation Age of Onset  . Colon cancer Mother        dx in her 31s  . Heart disease Mother   . Alzheimer's disease Mother   . Colon cancer Father        dx in his early 84s  . Heart disease Father   . Prostate cancer Father   . Prostate cancer Brother        1/2 brother  . Hemochromatosis Brother   . Prostate cancer Brother   . Esophageal cancer Neg Hx   . Pancreatic cancer Neg Hx   . Stomach cancer Neg Hx   . Liver disease Neg Hx      Atrial Fibrillation Management history:  Previous antiarrhythmic drugs: flecainide, dofetilide  Previous cardioversions: 07/29/20, 08/19/20 Previous ablations: none CHADS2VASC score: 6 Anticoagulation history: Xarelto   Past Medical History:  Diagnosis Date  . Allergy   . Arthritis    "right knee and right thumb" (12/19/2014)  . Asthma    pt denies  . Bronchitis 07/2016   started in December and continued about 2 months  . Esophageal stricture   . GERD (gastroesophageal reflux disease)   . Hemochromatosis   . Hiatal hernia   . Hypercalcemia   .  Hypercholesteremia   . Hypertension   . Internal hemorrhoids   . Lung cancer ([place])   . Osteoarthritis   . Palpitations   . Presence of permanent cardiac pacemaker    hx bradycardia  . Retinal artery occlusion, branch    "right eye"  . Second degree AV block, Mobitz type II   . Skin cancer    right shoulder  . Stroke [facility]) ~ 2011   "right eye"/ partial blindness  . Tubular adenoma of colon    Past Surgical History:  Procedure Laterality Date  . CARDIOVERSION N/A 07/29/2020   Procedure: CARDIOVERSION;   Surgeon: Jerline Pain, MD;  Location: [facility];  Service: Cardiovascular;  Laterality: N/A;  NEEDS RAPID COVID TEST MORNING OF  . CARDIOVERSION N/A 08/19/2020   Procedure: CARDIOVERSION;  Surgeon: Thayer Headings, MD;  Location: [facility];  Service: Cardiovascular;  Laterality: N/A;  . CATARACT EXTRACTION, BILATERAL  01/2017  . COLONOSCOPY    . EP IMPLANTABLE DEVICE N/A 12/19/2014   Procedure: Pacemaker Implant;  Surgeon: Deboraha Sprang, MD;  Location: [facility] CV [facility];  Service: Cardiovascular;  Laterality: N/A;  . ESOPHAGOGASTRODUODENOSCOPY (EGD) WITH ESOPHAGEAL DILATION  X 3  . EYE SURGERY Bilateral 2017  . INGUINAL HERNIA REPAIR Left 1980's  . INSERT / REPLACE / REMOVE PACEMAKER  12/19/2014  . INTERCOSTAL NERVE BLOCK Right 03/11/2020   Procedure: INTERCOSTAL NERVE BLOCK;  Surgeon: Grace Isaac, MD;  Location: [facility] [facility];  Service: Thoracic;  Laterality: Right;  . JOINT REPLACEMENT Right 2016  . KNEE ARTHROSCOPY Right ~ 1982; ~ 1992  . MOHS SURGERY Right ~ 2014   "pre-melanoma scapula"  . POSTERIOR TIBIAL TENDON REPAIR Left 2012  . TONSILLECTOMY  1946  . TOTAL KNEE ARTHROPLASTY Right 05/20/2015   Procedure: RIGHT TOTAL KNEE ARTHROPLASTY;  Surgeon: Paralee Cancel, MD;  Location: [facility];  Service: Orthopedics;  Laterality: Right;  . UPPER GASTROINTESTINAL ENDOSCOPY    . VIDEO BRONCHOSCOPY N/A 03/11/2020   Procedure: VIDEO BRONCHOSCOPY;  Surgeon: Grace Isaac, MD;  Location: [facility];  Service: Thoracic;  Laterality: N/A;  . XI ROBOTIC ASSISTED THORACOSCOPY- SEGMENTECTOMY Right 03/11/2020   Procedure: XI ROBOTIC ASSISTED THORACOSCOPY-RIGHT SUPERIOR SEGMENTECTOMY WITH NODE SAMPLES;  Surgeon: Grace Isaac, MD;  Location: [facility];  Service: Thoracic;  Laterality: Right;    Current Outpatient Medications  Medication Sig Dispense Refill  . acetaminophen (TYLENOL) 500 MG tablet Take 500-1,000 mg by mouth every 6 (six) hours as needed (for pain).    Marland Kitchen atorvastatin (LIPITOR) 20 MG  tablet Take 1 tablet (20 mg total) by mouth daily. (Patient taking differently: Take 20 mg by mouth at bedtime.) 30 tablet 3  . celecoxib (CELEBREX) 200 MG capsule Take 1 capsule (200 mg total) by mouth 2 (two) times daily as needed for mild pain. 10 capsule 0  . dofetilide (TIKOSYN) 250 MCG capsule Take 1 capsule (250 mcg total) by mouth 2 (two) times daily. 60 capsule 6  . doxazosin (CARDURA) 4 MG tablet Take 4 mg by mouth daily.     Marland Kitchen esomeprazole (NEXIUM) 20 MG capsule Take 20 mg by mouth daily before breakfast.    . fluticasone (FLONASE) 50 MCG/ACT nasal spray Place 1 spray into both nostrils daily as needed for allergies or rhinitis.    . Glycerin-Hypromellose-PEG 400 (DRY EYE RELIEF DROPS) 0.2-0.2-1 % SOLN Place 1 drop into both eyes daily as needed (for dryness). Saline    . melatonin 3 MG TABS tablet Take 3 mg by  mouth at bedtime as needed (for sleep).    . metoprolol succinate (TOPROL XL) 25 MG 24 hr tablet Take 0.5 tablets (12.5 mg total) by mouth at bedtime. 30 tablet 3  . montelukast (SINGULAIR) 10 MG tablet Take 10 mg by mouth every morning.     . Multiple Vitamins-Minerals (PRESERVISION AREDS 2 PO) Take 1 capsule by mouth in the morning and at bedtime.     . potassium chloride SA (KLOR-CON) 20 MEQ tablet Take 2 tablets (40 mEq total) by mouth daily. Take an extra tablet (20 meq) anytime you take an extra torsemide 90 tablet 6  . rivaroxaban (XARELTO) 20 MG TABS tablet Take 1 tablet (20 mg total) by mouth daily with supper. 30 tablet 11  . torsemide (DEMADEX) 20 MG tablet Take 1 tablet (20 mg total) by mouth daily. Take extra 20 mg as needed for weight gain of 3 lbs overnight or 5 lbs within one week. 60 tablet 2  . vitamin C (ASCORBIC ACID) 500 MG tablet Take 500 mg by mouth at bedtime.     . diazepam (VALIUM) 5 MG tablet Take 5 mg by mouth at bedtime as needed (for sleep). (Patient not taking: Reported on 09/04/2020)    . HYDROcodone-homatropine (HYCODAN) 5-1.5 MG/5ML syrup Take 5 mLs  by mouth every 6 (six) hours as needed for cough. (Patient not taking: Reported on 09/04/2020)    . temazepam (RESTORIL) 15 MG capsule Take 15 mg by mouth at bedtime as needed for sleep. (Patient not taking: Reported on 09/04/2020)     No current facility-administered medications for this encounter.    Allergies  Allergen Reactions  . Lisinopril Palpitations and Other (See Comments)    Dizziness, also    Social History   Socioeconomic History  . Marital status: Married    Spouse name: Not on file  . Number of children: Not on file  . Years of education: Not on file  . Highest education level: Not on file  Occupational History  . Occupation: retired - Doctor, general practice  Tobacco Use  . Smoking status: Former Smoker    Years: 3.00    Types: Cigarettes    Quit date: 05/16/1962    Years since quitting: 58.3  . Smokeless tobacco: Never Used  . Tobacco comment: "quit smoking in the 1960's"; was an intermittent smoker   Vaping Use  . Vaping Use: Never used  Substance and Sexual Activity  . Alcohol use: No    Comment: 12/19/2014 "stopped drinking in ~ 2012"  . Drug use: No  . Sexual activity: Yes  Other Topics Concern  . Not on file  Social History Narrative  . Not on file   Social Determinants of Health   Financial Resource Strain: Not on file  Food Insecurity: Not on file  Transportation Needs: Not on file  Physical Activity: Not on file  Stress: Not on file  Social Connections: Not on file  Intimate Partner Violence: Not on file     ROS- All systems are reviewed and negative except as per the HPI above.  Physical Exam: Vitals:   09/04/20 0916  BP: 128/76  Pulse: 71  Weight: 74.7 kg  Height: 5\' 10"  (1.778 m)   Well appearing, alert and conversant, regular work of breathing,  good skin color Eyes- anicteric, neuro- grossly intact, skin- no apparent rash or lesions or cyanosis, mouth- oral mucosa is pink   Wt Readings from Last 3 Encounters:  09/04/20 74.7 kg   08/29/20 73.8 kg  08/23/20 76.7 kg    EKG today demonstrates  A sense V paced rhythm Vent. rate 71 BPM PR interval 192 ms QRS duration 168 ms QT/QTc 468/508 ms (460-470 ms when corrected for wide QRS)  Echo 08/14/20 demonstrated  1. Left ventricular ejection fraction, by estimation, is 60 to 65%. The  left ventricle has normal function. Left ventricular endocardial border  not optimally defined to evaluate regional wall motion. Left ventricular  diastolic parameters are  indeterminate.  2. Right ventricular systolic function is mildly reduced. The right  ventricular size is mildly enlarged. There is normal pulmonary artery  systolic pressure. The estimated right ventricular systolic pressure is  40.1 mmHg.  3. Left atrial size was moderately dilated.  4. Right atrial size was mild to moderately dilated.  5. The mitral valve is degenerative. Trivial mitral valve regurgitation.  No evidence of mitral stenosis. Moderate mitral annular calcification.  6. The aortic valve is abnormal. There is moderate calcification of the  aortic valve with fixed left coronary cusp and relatively preserved motion  of the non-coronary cusp and right coronary cusp. Aortic valve  regurgitation is not visualized. Moderate  aortic valve sclerosis/calcification is present, without any evidence of  aortic stenosis, however Doppler interrogation only performed in one  location.  7. Aortic dilatation noted. There is mild to moderate dilatation of the  ascending aorta, measuring 44 mm.  8. The inferior vena cava is dilated in size with <50% respiratory  variability, suggesting right atrial pressure of 15 mmHg.   Epic records are reviewed at length today  CHA2DS2-VASc Score = 6  The patient's score is based upon: CHF History: Yes HTN History: Yes Diabetes History: No Stroke History: Yes Vascular Disease History: No Age Score: 2 Gender Score: 0      ASSESSMENT AND PLAN: 1. Persistent  Atrial Fibrillation (ICD10:  I48.19) The patient's CHA2DS2-VASc score is 6, indicating a 9.7% annual risk of stroke.   S/p dofetilide loading. Patient appears to be maintaining SR. Continue Toprol 12.5 mg daily Check bmet/mag today. Continue Xarelto 20 mg daily (Eliquis not covered by patient insurance)  2. Secondary Hypercoagulable State (ICD10:  D68.69) The patient is at significant risk for stroke/thromboembolism based upon his CHA2DS2-VASc Score of 6.  Continue Rivaroxaban (Xarelto).   3. HTN Stable, no changes today.  4. 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  5. Chronic diastolic CHF Weight trending down.  Continue torsemide 20 mg daily with K+ supplementation.  6. Small cell lung cancer Plans per oncology.   Follow up with Oda Kilts and Dr Caryl Comes as scheduled.    [facility] [address] [place],  [address] [phone] 09/04/2020 9:45 AM

## 2020-09-04 NOTE — Progress Notes (Signed)
Patient is being seen in the Atrial Fibrillation Clinic. VS, EKG, and device interrogations performed in the clinic. Clint Fenton, PA is at home and seeing patients via telemedicine as he is in quarantine.

## 2020-09-23 ENCOUNTER — Ambulatory Visit (INDEPENDENT_AMBULATORY_CARE_PROVIDER_SITE_OTHER): Payer: Medicare Other

## 2020-09-23 ENCOUNTER — Telehealth (HOSPITAL_COMMUNITY): Payer: Self-pay | Admitting: *Deleted

## 2020-09-23 DIAGNOSIS — I441 Atrioventricular block, second degree: Secondary | ICD-10-CM

## 2020-09-23 LAB — CUP PACEART REMOTE DEVICE CHECK
Battery Remaining Longevity: 37 mo
Battery Voltage: 2.97 V
Brady Statistic AP VP Percent: 28.43 %
Brady Statistic AP VS Percent: 0.02 %
Brady Statistic AS VP Percent: 70.8 %
Brady Statistic AS VS Percent: 0.76 %
Brady Statistic RA Percent Paced: 27.07 %
Brady Statistic RV Percent Paced: 99.2 %
Date Time Interrogation Session: 20220221111916
Implantable Lead Implant Date: 20160518
Implantable Lead Implant Date: 20160518
Implantable Lead Location: 753859
Implantable Lead Location: 753860
Implantable Lead Model: 5076
Implantable Lead Model: 5076
Implantable Pulse Generator Implant Date: 20160518
Lead Channel Impedance Value: 342 Ohm
Lead Channel Impedance Value: 399 Ohm
Lead Channel Impedance Value: 475 Ohm
Lead Channel Impedance Value: 475 Ohm
Lead Channel Pacing Threshold Amplitude: 0.5 V
Lead Channel Pacing Threshold Amplitude: 1 V
Lead Channel Pacing Threshold Pulse Width: 0.4 ms
Lead Channel Pacing Threshold Pulse Width: 0.4 ms
Lead Channel Sensing Intrinsic Amplitude: 2.75 mV
Lead Channel Sensing Intrinsic Amplitude: 2.75 mV
Lead Channel Sensing Intrinsic Amplitude: 4 mV
Lead Channel Sensing Intrinsic Amplitude: 4 mV
Lead Channel Setting Pacing Amplitude: 2 V
Lead Channel Setting Pacing Amplitude: 2.5 V
Lead Channel Setting Pacing Pulse Width: 0.4 ms
Lead Channel Setting Sensing Sensitivity: 0.9 mV

## 2020-09-23 NOTE — Telephone Encounter (Signed)
Transmission received,  Pt is not currently in AF.  Advised most recent episode of AF was 08/23/20 for >4days.    Spoke with pt, he states he thought he might be in Af cause he was feeling a "little off" the past couple fo days.  Pt currently in [place] pt to continue meds as ordered.

## 2020-09-23 NOTE — Telephone Encounter (Signed)
Patient called in stating he is in [place] and thinks he is possibly in Afib. He would like to send a transmission. Will let pt know once transmission reviewed.

## 2020-09-27 NOTE — Progress Notes (Signed)
Remote pacemaker transmission.   

## 2020-10-01 ENCOUNTER — Telehealth: Payer: Self-pay | Admitting: Medical Oncology

## 2020-10-01 NOTE — Telephone Encounter (Signed)
scheduling phone number give to pt .

## 2020-10-03 ENCOUNTER — Other Ambulatory Visit: Payer: Self-pay

## 2020-10-03 ENCOUNTER — Encounter: Payer: Self-pay | Admitting: Student

## 2020-10-03 ENCOUNTER — Ambulatory Visit (INDEPENDENT_AMBULATORY_CARE_PROVIDER_SITE_OTHER): Payer: Medicare Other | Admitting: Student

## 2020-10-03 VITALS — BP 112/68 | HR 68 | Ht 70.0 in | Wt 156.2 lb

## 2020-10-03 DIAGNOSIS — I10 Essential (primary) hypertension: Secondary | ICD-10-CM

## 2020-10-03 DIAGNOSIS — R0600 Dyspnea, unspecified: Secondary | ICD-10-CM | POA: Diagnosis not present

## 2020-10-03 DIAGNOSIS — I441 Atrioventricular block, second degree: Secondary | ICD-10-CM | POA: Diagnosis not present

## 2020-10-03 DIAGNOSIS — R0609 Other forms of dyspnea: Secondary | ICD-10-CM

## 2020-10-03 DIAGNOSIS — I4819 Other persistent atrial fibrillation: Secondary | ICD-10-CM | POA: Diagnosis not present

## 2020-10-03 LAB — CUP PACEART INCLINIC DEVICE CHECK
Battery Remaining Longevity: 38 mo
Battery Voltage: 2.97 V
Brady Statistic AP VP Percent: 28.56 %
Brady Statistic AP VS Percent: 0.02 %
Brady Statistic AS VP Percent: 70.86 %
Brady Statistic AS VS Percent: 0.57 %
Brady Statistic RA Percent Paced: 27.53 %
Brady Statistic RV Percent Paced: 99.39 %
Date Time Interrogation Session: 20220303132455
Implantable Lead Implant Date: 20160518
Implantable Lead Implant Date: 20160518
Implantable Lead Location: 753859
Implantable Lead Location: 753860
Implantable Lead Model: 5076
Implantable Lead Model: 5076
Implantable Pulse Generator Implant Date: 20160518
Lead Channel Impedance Value: 342 Ohm
Lead Channel Impedance Value: 418 Ohm
Lead Channel Impedance Value: 494 Ohm
Lead Channel Impedance Value: 513 Ohm
Lead Channel Pacing Threshold Amplitude: 0.5 V
Lead Channel Pacing Threshold Amplitude: 0.875 V
Lead Channel Pacing Threshold Pulse Width: 0.4 ms
Lead Channel Pacing Threshold Pulse Width: 0.4 ms
Lead Channel Sensing Intrinsic Amplitude: 14.875 mV
Lead Channel Sensing Intrinsic Amplitude: 2.875 mV
Lead Channel Sensing Intrinsic Amplitude: 4.125 mV
Lead Channel Sensing Intrinsic Amplitude: 4.375 mV
Lead Channel Setting Pacing Amplitude: 1.75 V
Lead Channel Setting Pacing Amplitude: 2.5 V
Lead Channel Setting Pacing Pulse Width: 0.4 ms
Lead Channel Setting Sensing Sensitivity: 0.9 mV

## 2020-10-03 NOTE — Progress Notes (Signed)
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  Since last being seen in our clinic the patient reports doing well. He recently took a trip to [place] and had a good time, although the weather was less than ideal.  He denies SOB on flat ground. He has SOB and fatigue with inclines or stairs.  His weight has been stable in the 150s, and he would like to try and come off torsemide if possible, as was discussed while he was in [place]. He drinks > 1.5 L most days.   Device History: Medtronic Dual Chamber PPM implanted 12/19/2014 for CHB  Past Medical History:  Diagnosis Date  . Allergy   . Arthritis    "right knee and right thumb" (12/19/2014)  . Asthma    pt denies  . Bronchitis 07/2016   started in December and continued about 2 months  . Central retinal artery occlusion 06/26/2013  . CHF (congestive heart failure) ([place]) 08/24/2020  . Esophageal stricture   . GERD (gastroesophageal reflux disease)   . Hemochromatosis   . Hiatal hernia   . Hypercalcemia   . Hypercholesteremia   . Hypertension   . Internal hemorrhoids   . Lung cancer ([place])   . Osteoarthritis   . Palpitations   . Persistent atrial fibrillation ([place])   . Presence of permanent cardiac pacemaker    hx bradycardia  . Retinal artery occlusion, branch    "right eye"  . Second degree AV block, Mobitz type II   . Skin cancer    right shoulder  . Small cell lung cancer, right lower lobe ([place]) 03/12/2020  . Stroke [facility]) ~ 2011   "right eye"/ partial blindness  . Tubular adenoma of colon    Past Surgical History:  Procedure Laterality Date  . CARDIOVERSION N/A 07/29/2020   Procedure: CARDIOVERSION;  Surgeon:  Jerline Pain, MD;  Location: [place];  Service: Cardiovascular;  Laterality: N/A;  NEEDS RAPID COVID TEST MORNING OF  . CARDIOVERSION N/A 08/19/2020   Procedure: CARDIOVERSION;  Surgeon: Thayer Headings, MD;  Location: [place];  Service: Cardiovascular;  Laterality: N/A;  . CATARACT EXTRACTION, BILATERAL  01/2017  . COLONOSCOPY    . EP IMPLANTABLE DEVICE N/A 12/19/2014   Procedure: Pacemaker Implant;  Surgeon: Deboraha Sprang, MD;  Location: [place] [facility];  Service: Cardiovascular;  Laterality: N/A;  . ESOPHAGOGASTRODUODENOSCOPY (EGD) WITH ESOPHAGEAL DILATION  X 3  . EYE SURGERY Bilateral 2017  . INGUINAL HERNIA REPAIR Left 1980's  . INSERT / REPLACE / REMOVE PACEMAKER  12/19/2014  . INTERCOSTAL NERVE BLOCK Right 03/11/2020   Procedure: INTERCOSTAL NERVE BLOCK;  Surgeon: Grace Isaac, MD;  Location: [place];  Service: Thoracic;  Laterality: Right;  . JOINT REPLACEMENT Right 2016  . KNEE ARTHROSCOPY Right ~ 1982; ~ 1992  . MOHS SURGERY Right ~ 2014   "pre-melanoma scapula"  . POSTERIOR TIBIAL TENDON REPAIR Left 2012  . TONSILLECTOMY  1946  . TOTAL KNEE ARTHROPLASTY Right 05/20/2015   Procedure: RIGHT TOTAL KNEE ARTHROPLASTY;  Surgeon: Paralee Cancel, MD;  Location: [facility];  Service: Orthopedics;  Laterality: Right;  . UPPER GASTROINTESTINAL ENDOSCOPY    . VIDEO BRONCHOSCOPY N/A 03/11/2020   Procedure:  VIDEO BRONCHOSCOPY;  Surgeon: Grace Isaac, MD;  Location: [facility];  Service: Thoracic;  Laterality: N/A;  . XI ROBOTIC ASSISTED THORACOSCOPY- SEGMENTECTOMY Right 03/11/2020   Procedure: XI ROBOTIC ASSISTED THORACOSCOPY-RIGHT SUPERIOR SEGMENTECTOMY WITH NODE SAMPLES;  Surgeon: Grace Isaac, MD;  Location: [facility];  Service: Thoracic;  Laterality: Right;    Current Outpatient Medications  Medication Sig Dispense Refill  . acetaminophen (TYLENOL) 500 MG tablet Take 500-1,000 mg by mouth every 6 (six) hours as needed (for pain).    Marland Kitchen atorvastatin (LIPITOR) 20 MG tablet Take  1 tablet (20 mg total) by mouth daily. 30 tablet 3  . celecoxib (CELEBREX) 200 MG capsule Take 1 capsule (200 mg total) by mouth 2 (two) times daily as needed for mild pain. 10 capsule 0  . dofetilide (TIKOSYN) 250 MCG capsule Take 1 capsule (250 mcg total) by mouth 2 (two) times daily. 60 capsule 6  . doxazosin (CARDURA) 4 MG tablet Take 4 mg by mouth daily.     Marland Kitchen esomeprazole (NEXIUM) 20 MG capsule Take 20 mg by mouth daily before breakfast.    . fluticasone (FLONASE) 50 MCG/ACT nasal spray Place 1 spray into both nostrils daily as needed for allergies or rhinitis.    . Glycerin-Hypromellose-PEG 400 (DRY EYE RELIEF DROPS) 0.2-0.2-1 % SOLN Place 1 drop into both eyes daily as needed (for dryness). Saline    . HYDROcodone-homatropine (HYCODAN) 5-1.5 MG/5ML syrup Take 5 mLs by mouth every 6 (six) hours as needed for cough.    . metoprolol succinate (TOPROL XL) 25 MG 24 hr tablet Take 0.5 tablets (12.5 mg total) by mouth at bedtime. 30 tablet 3  . montelukast (SINGULAIR) 10 MG tablet Take 10 mg by mouth every morning.     . Multiple Vitamins-Minerals (PRESERVISION AREDS 2 PO) Take 1 capsule by mouth in the morning and at bedtime.     . potassium chloride SA (KLOR-CON) 20 MEQ tablet Take 2 tablets (40 mEq total) by mouth 2 (two) times daily. Take an extra tablet (20 meq) anytime you take an extra torsemide 130 tablet 6  . rivaroxaban (XARELTO) 20 MG TABS tablet Take 1 tablet (20 mg total) by mouth daily with supper. 30 tablet 11  . torsemide (DEMADEX) 20 MG tablet Take 1 tablet (20 mg total) by mouth daily. Take extra 20 mg as needed for weight gain of 3 lbs overnight or 5 lbs within one week. 60 tablet 2  . vitamin C (ASCORBIC ACID) 500 MG tablet Take 500 mg by mouth at bedtime.      No current facility-administered medications for this visit.    Allergies:   Lisinopril   Social History: Social History   Socioeconomic History  . Marital status: Married    Spouse name: Not on file  . Number  of children: Not on file  . Years of education: Not on file  . Highest education level: Not on file  Occupational History  . Occupation: retired - Doctor, general practice  Tobacco Use  . Smoking status: Former Smoker    Years: 3.00    Types: Cigarettes    Quit date: 05/16/1962    Years since quitting: 58.4  . Smokeless tobacco: Never Used  . Tobacco comment: "quit smoking in the 1960's"; was an intermittent smoker   Vaping Use  . Vaping Use: Never used  Substance and Sexual Activity  . Alcohol use: No    Comment: 12/19/2014 "stopped drinking in ~ 2012"  . Drug use: No  .  Sexual activity: Yes  Other Topics Concern  . Not on file  Social History Narrative  . Not on file   Social Determinants of Health   Financial Resource Strain: Not on file  Food Insecurity: Not on file  Transportation Needs: Not on file  Physical Activity: Not on file  Stress: Not on file  Social Connections: Not on file  Intimate Partner Violence: Not on file    Family History: Family History  Problem Relation Age of Onset  . Colon cancer Mother        dx in her 42s  . Heart disease Mother   . Alzheimer's disease Mother   . Colon cancer Father        dx in his early 61s  . Heart disease Father   . Prostate cancer Father   . Prostate cancer Brother        1/2 brother  . Hemochromatosis Brother   . Prostate cancer Brother   . Esophageal cancer Neg Hx   . Pancreatic cancer Neg Hx   . Stomach cancer Neg Hx   . Liver disease Neg Hx      Review of Systems: All other systems reviewed and are otherwise negative except as noted above.  Physical Exam: Vitals:   10/03/20 1308  BP: 112/68  Pulse: 68  SpO2: 99%  Weight: 156 lb 3.2 oz (70.9 kg)  Height: 5\' 10"  (1.778 m)     GEN- The patient is well appearing, alert and oriented x 3 today.   HEENT: normocephalic, atraumatic; sclera clear, conjunctiva pink; hearing intact; oropharynx clear; neck supple  Lungs- Clear to ausculation bilaterally,  normal work of breathing.  No wheezes, rales, rhonchi Heart- Regular rate and rhythm, no murmurs, rubs or gallops  GI- soft, non-tender, non-distended, bowel sounds present  Extremities- no clubbing or cyanosis. No edema MS- no significant deformity or atrophy Skin- warm and dry, no rash or lesion; PPM pocket well healed Psych- euthymic mood, full affect Neuro- strength and sensation are intact  PPM Interrogation- reviewed in detail today,  See PACEART report  EKG:  EKG is ordered today. The ekg ordered today shows A/V dual pacing at 68 bpm. QTc ~440-428ms when corrected for QRS ms.  Recent Labs: 08/23/2020: TSH 5.630 08/25/2020: ALT 24; B Natriuretic Peptide 976.1; Hemoglobin 12.4; Platelets 130 09/04/2020: BUN 18; Creatinine, Ser 1.00; Magnesium 2.2; Potassium 3.6; Sodium 140   Wt Readings from Last 3 Encounters:  10/03/20 156 lb 3.2 oz (70.9 kg)  09/04/20 164 lb 9.6 oz (74.7 kg)  08/29/20 162 lb 11.2 oz (73.8 kg)     Other studies Reviewed: Additional studies/ records that were reviewed today include: Previous EP office notes, Previous remote checks, Most recent labwork.   Assessment and Plan:  1.Advanced AV blocks/p MedtronicPPM  Normal PPM function  See Pace Art report No changes today  2. SCAF -> now persistent atrial fibrillation NSR by EKG today on tikosyn 250 mcg BID QTc stable.  BMET/Mg today.  Continue Xarelto 20 mg daily.   3. Chronic diastolic CHF LVEF 01/629 16-01%.  Stable volume status.  Continue torsemide 20 mg daily. He would like to titrate this down to lowest effective dose. We may attempt to do so cautiously based on labwork today. I set the expectation that he is likely currently maintaining on his current doses (he denies lightheadedness or dizziness), and my recommendation may not be to adjust at all based on labs/BNP.   4. HTN No change today.  5. Small cell lung cancer S/pVideo bronchoscopy, robotic-assisted right superior segmentectomy  with lymph node sampling and intercostal nerve block8/04/2020 Receiving systemic chemotherapy with carboplatin for AUC of 5 on day 1 and etoposide 100 mg/M2 on days 1, 2 and 3 with Neulasta support every 3 weeks.   6. H/o CRAO Continue ASA for now.   Current medicines are reviewed at length with the patient today.   The patient has concerns regarding his medicines. He would like to downtitrate diuretic if possible. The following changes were made today:  none  Labs/ tests ordered today include:  Orders Placed This Encounter  Procedures  . Basic metabolic panel  . Magnesium  . Pro b natriuretic peptide (BNP)  . EKG 12-Lead    Disposition:   Follow up with Dr. Caryl Comes in 3 Weeks as scheduled per pt request. To further discuss diuretics, especially if adjustments are made today.     Jacalyn Lefevre, PA-C  10/03/2020 1:31 PM  [address] [place] [address] [phone] (office) [phone] (fax)

## 2020-10-03 NOTE — Patient Instructions (Addendum)
Medication Instructions:  Your physician recommends that you continue on your current medications as directed. Please refer to the Current Medication list given to you today.  *If you need a refill on your cardiac medications before your next appointment, please call your pharmacy*   Lab Work: TODAY: BMET, MAGNESIUM, BNP  If you have labs (blood work) drawn today and your tests are completely normal, you will receive your results only by: Marland Kitchen MyChart Message (if you have MyChart) OR . A paper copy in the mail If you have any lab test that is abnormal or we need to change your treatment, we will call you to review the results.   Follow-Up: At [facility], you and your health needs are our priority.  As part of our continuing mission to provide you with exceptional heart care, we have created designated Provider Care Teams.  These Care Teams include your primary Cardiologist (physician) and Advanced Practice Providers (APPs -  Physician Assistants and Nurse Practitioners) who all work together to provide you with the care you need, when you need it.  Your next appointment:   Keep your f/u appointment with Dr Caryl Comes as scheduled

## 2020-10-04 LAB — PRO B NATRIURETIC PEPTIDE: NT-Pro BNP: 471 pg/mL (ref 0–486)

## 2020-10-04 LAB — BASIC METABOLIC PANEL
BUN/Creatinine Ratio: 16 (ref 10–24)
BUN: 18 mg/dL (ref 8–27)
CO2: 23 mmol/L (ref 20–29)
Calcium: 10.2 mg/dL (ref 8.6–10.2)
Chloride: 103 mmol/L (ref 96–106)
Creatinine, Ser: 1.14 mg/dL (ref 0.76–1.27)
Glucose: 104 mg/dL — ABNORMAL HIGH (ref 65–99)
Potassium: 4.7 mmol/L (ref 3.5–5.2)
Sodium: 142 mmol/L (ref 134–144)
eGFR: 65 mL/min/{1.73_m2} (ref >59)

## 2020-10-04 LAB — MAGNESIUM: Magnesium: 2.2 mg/dL (ref 1.6–2.3)

## 2020-10-09 DIAGNOSIS — H0100A Unspecified blepharitis right eye, upper and lower eyelids: Secondary | ICD-10-CM | POA: Diagnosis not present

## 2020-10-09 DIAGNOSIS — H0012 Chalazion right lower eyelid: Secondary | ICD-10-CM | POA: Diagnosis not present

## 2020-10-09 DIAGNOSIS — H02105 Unspecified ectropion of left lower eyelid: Secondary | ICD-10-CM | POA: Diagnosis not present

## 2020-10-09 DIAGNOSIS — H0100B Unspecified blepharitis left eye, upper and lower eyelids: Secondary | ICD-10-CM | POA: Diagnosis not present

## 2020-10-14 ENCOUNTER — Other Ambulatory Visit: Payer: Self-pay

## 2020-10-14 ENCOUNTER — Inpatient Hospital Stay: Payer: Medicare Other | Attending: Physician Assistant

## 2020-10-14 ENCOUNTER — Ambulatory Visit (HOSPITAL_COMMUNITY)
Admission: RE | Admit: 2020-10-14 | Discharge: 2020-10-14 | Disposition: A | Discharge status: Home / Self Care | Payer: Medicare Other | Source: Ambulatory Visit | Attending: Internal Medicine | Admitting: Internal Medicine

## 2020-10-14 ENCOUNTER — Encounter (HOSPITAL_COMMUNITY): Payer: Self-pay

## 2020-10-14 DIAGNOSIS — R1011 Right upper quadrant pain: Secondary | ICD-10-CM | POA: Insufficient documentation

## 2020-10-14 DIAGNOSIS — Z79899 Other long term (current) drug therapy: Secondary | ICD-10-CM | POA: Insufficient documentation

## 2020-10-14 DIAGNOSIS — Z8719 Personal history of other diseases of the digestive system: Secondary | ICD-10-CM | POA: Insufficient documentation

## 2020-10-14 DIAGNOSIS — S22080A Wedge compression fracture of T11-T12 vertebra, initial encounter for closed fracture: Secondary | ICD-10-CM | POA: Diagnosis not present

## 2020-10-14 DIAGNOSIS — K219 Gastro-esophageal reflux disease without esophagitis: Secondary | ICD-10-CM | POA: Insufficient documentation

## 2020-10-14 DIAGNOSIS — E7800 Pure hypercholesterolemia, unspecified: Secondary | ICD-10-CM | POA: Insufficient documentation

## 2020-10-14 DIAGNOSIS — I712 Thoracic aortic aneurysm, without rupture: Secondary | ICD-10-CM | POA: Diagnosis not present

## 2020-10-14 DIAGNOSIS — C3431 Malignant neoplasm of lower lobe, right bronchus or lung: Secondary | ICD-10-CM | POA: Insufficient documentation

## 2020-10-14 DIAGNOSIS — Z7901 Long term (current) use of anticoagulants: Secondary | ICD-10-CM | POA: Insufficient documentation

## 2020-10-14 DIAGNOSIS — C3490 Malignant neoplasm of unspecified part of unspecified bronchus or lung: Secondary | ICD-10-CM

## 2020-10-14 DIAGNOSIS — I509 Heart failure, unspecified: Secondary | ICD-10-CM | POA: Insufficient documentation

## 2020-10-14 DIAGNOSIS — I110 Hypertensive heart disease with heart failure: Secondary | ICD-10-CM | POA: Insufficient documentation

## 2020-10-14 DIAGNOSIS — Z9221 Personal history of antineoplastic chemotherapy: Secondary | ICD-10-CM | POA: Insufficient documentation

## 2020-10-14 DIAGNOSIS — Z8673 Personal history of transient ischemic attack (TIA), and cerebral infarction without residual deficits: Secondary | ICD-10-CM | POA: Insufficient documentation

## 2020-10-14 DIAGNOSIS — J90 Pleural effusion, not elsewhere classified: Secondary | ICD-10-CM | POA: Diagnosis not present

## 2020-10-14 DIAGNOSIS — M4854XA Collapsed vertebra, not elsewhere classified, thoracic region, initial encounter for fracture: Secondary | ICD-10-CM | POA: Insufficient documentation

## 2020-10-14 DIAGNOSIS — S22060A Wedge compression fracture of T7-T8 vertebra, initial encounter for closed fracture: Secondary | ICD-10-CM | POA: Diagnosis not present

## 2020-10-14 LAB — CMP (CANCER CENTER ONLY)
ALT: 20 U/L (ref 0–44)
AST: 26 U/L (ref 15–41)
Albumin: 3.8 g/dL (ref 3.5–5.0)
Alkaline Phosphatase: 399 U/L — ABNORMAL HIGH (ref 38–126)
Anion gap: 6 (ref 5–15)
BUN: 19 mg/dL (ref 8–23)
CO2: 28 mmol/L (ref 22–32)
Calcium: 10.5 mg/dL — ABNORMAL HIGH (ref 8.9–10.3)
Chloride: 105 mmol/L (ref 98–111)
Creatinine: 1.09 mg/dL (ref 0.61–1.24)
GFR, Estimated: >60 mL/min (ref >60)
Glucose, Bld: 111 mg/dL — ABNORMAL HIGH (ref 70–99)
Potassium: 4.2 mmol/L (ref 3.5–5.1)
Sodium: 139 mmol/L (ref 135–145)
Total Bilirubin: 1.9 mg/dL — ABNORMAL HIGH (ref 0.3–1.2)
Total Protein: 7.1 g/dL (ref 6.5–8.1)

## 2020-10-14 LAB — CBC WITH DIFFERENTIAL (CANCER CENTER ONLY)
Abs Immature Granulocytes: 0.01 10*3/uL (ref 0.00–0.07)
Basophils Absolute: 0.1 10*3/uL (ref 0.0–0.1)
Basophils Relative: 1 %
Eosinophils Absolute: 0.3 10*3/uL (ref 0.0–0.5)
Eosinophils Relative: 5 %
HCT: 37.9 % — ABNORMAL LOW (ref 39.0–52.0)
Hemoglobin: 12.3 g/dL — ABNORMAL LOW (ref 13.0–17.0)
Immature Granulocytes: 0 %
Lymphocytes Relative: 21 %
Lymphs Abs: 1.2 10*3/uL (ref 0.7–4.0)
MCH: 29 pg (ref 26.0–34.0)
MCHC: 32.5 g/dL (ref 30.0–36.0)
MCV: 89.4 fL (ref 80.0–100.0)
Monocytes Absolute: 0.7 10*3/uL (ref 0.1–1.0)
Monocytes Relative: 12 %
Neutro Abs: 3.4 10*3/uL (ref 1.7–7.7)
Neutrophils Relative %: 61 %
Platelet Count: 145 10*3/uL — ABNORMAL LOW (ref 150–400)
RBC: 4.24 MIL/uL (ref 4.22–5.81)
RDW: 19.9 % — ABNORMAL HIGH (ref 11.5–15.5)
WBC Count: 5.6 10*3/uL (ref 4.0–10.5)
nRBC: 0 % (ref 0.0–0.2)

## 2020-10-14 IMAGING — CT CT CHEST W/ CM
2 of 4 series · 15 of 36 positions shown, 18 images · IV contrast (omnipaque)
Comparison: CTA chest [DATE]

CLINICAL DATA: Small-cell lung cancer.  Staging.

EXAM:
CT CHEST WITH CONTRAST
TECHNIQUE: Multidetector CT imaging of the chest was performed during
intravenous contrast administration.
CONTRAST:  75mL OMNIPAQUE IOHEXOL 300 MG/ML  SOLN

[Series 2: axial st · axial · 0.87mm/px · z∈[+286,+572]mm · 12 of 170 slices shown, 15 images]
[im 14/170  mediastinal]
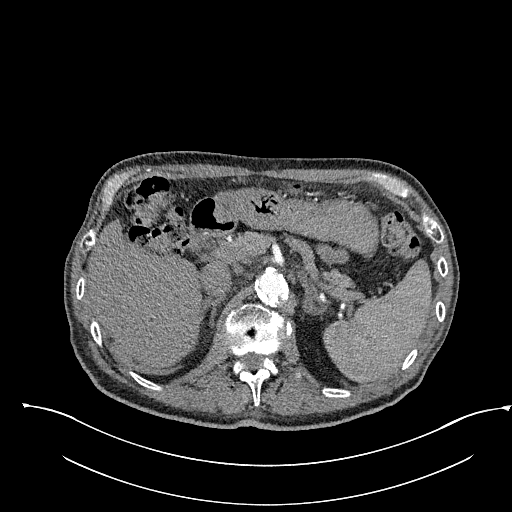
[im 14/170  lung]
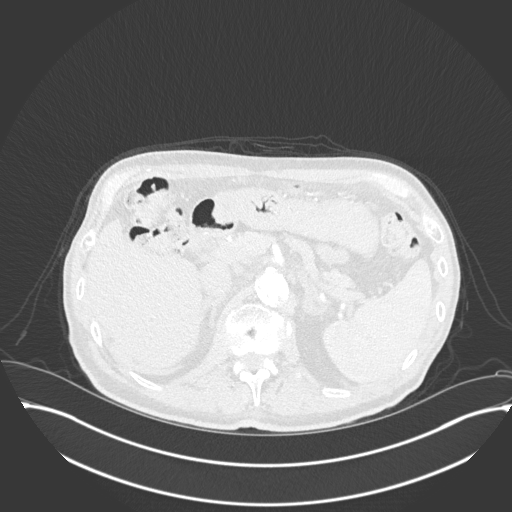
[im 27/170  lung]
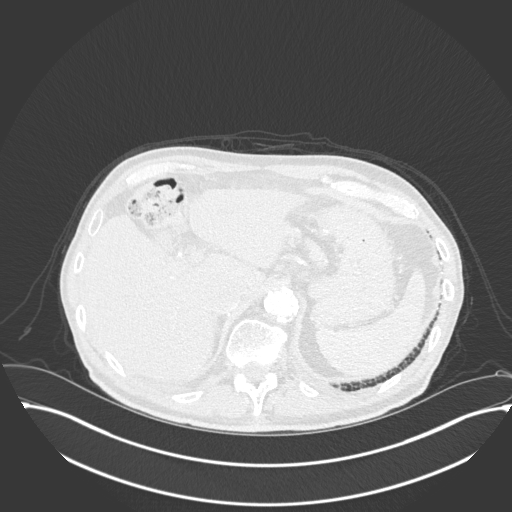
[im 40/170  lung]
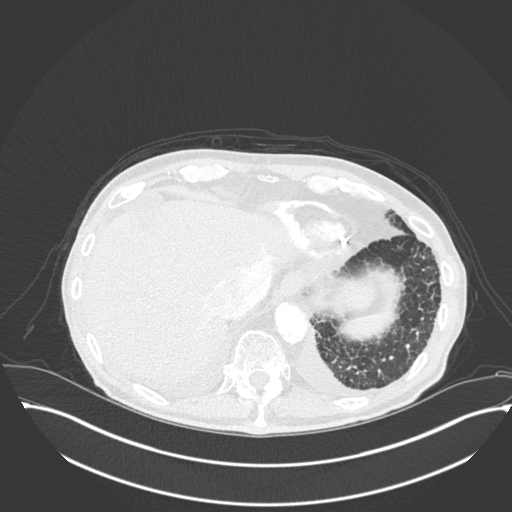
[im 53/170  lung]
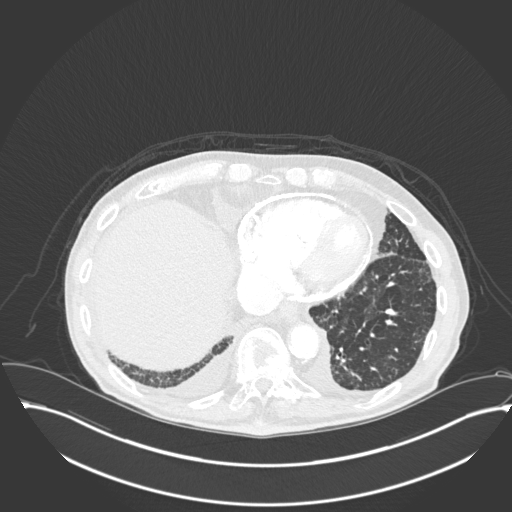
[im 66/170  mediastinal]
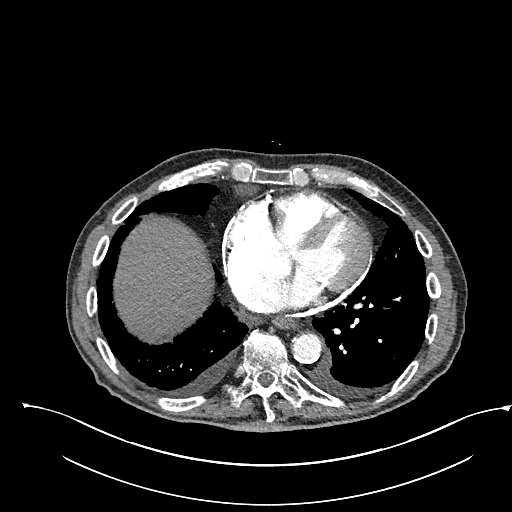
[im 66/170  lung]
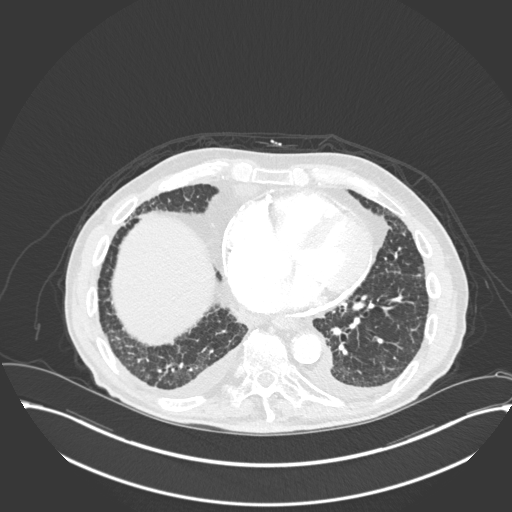
[im 79/170  lung]
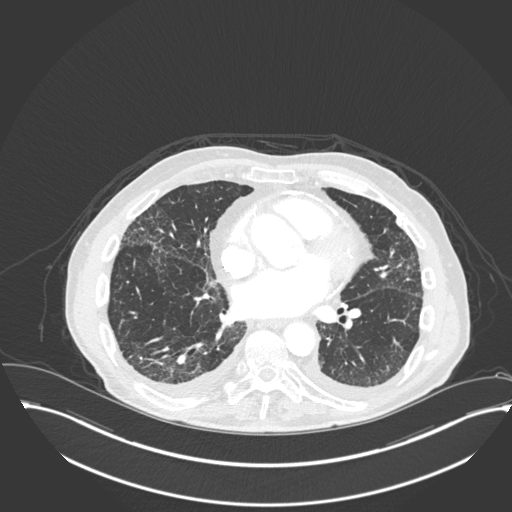
[im 92/170  lung]
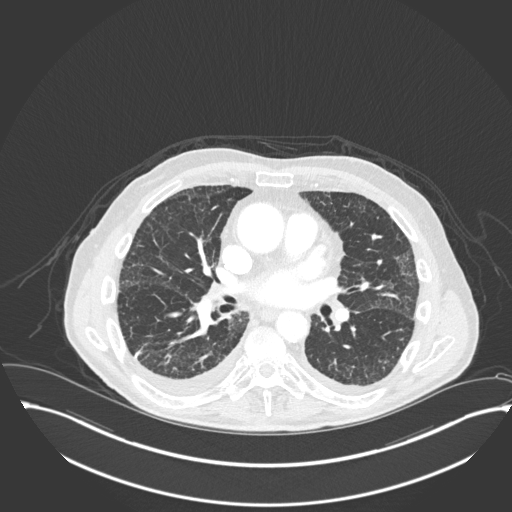
[im 105/170  lung]
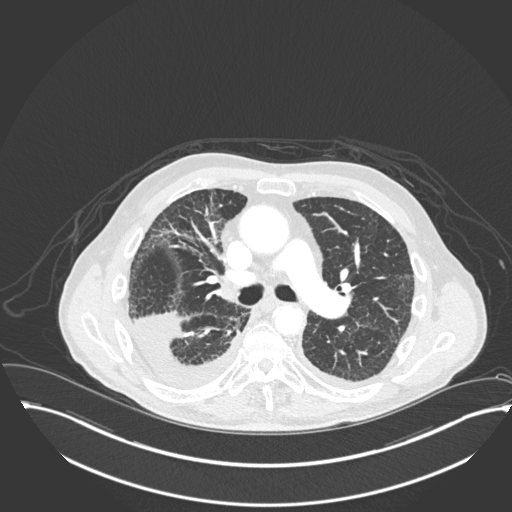
[im 118/170  mediastinal]
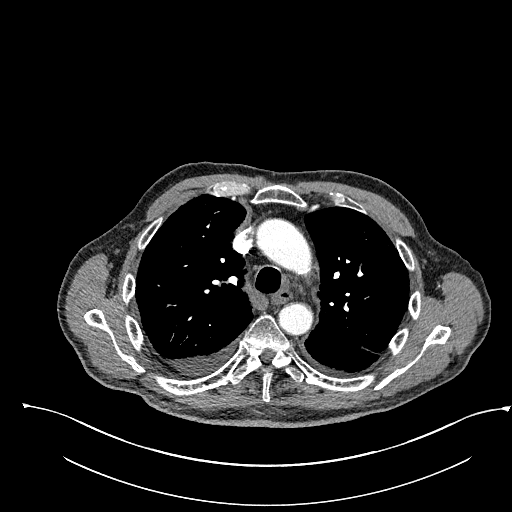
[im 118/170  lung]
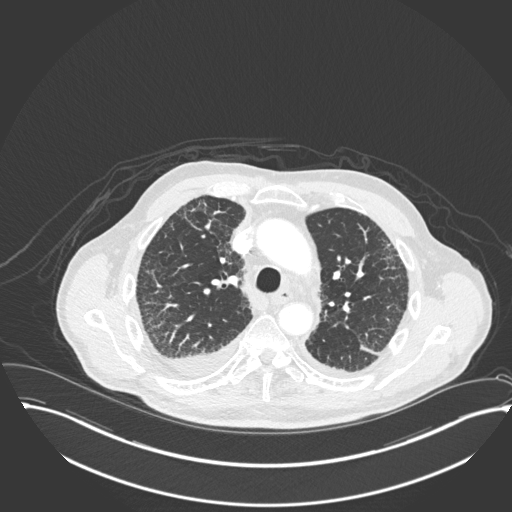
[im 131/170  lung]
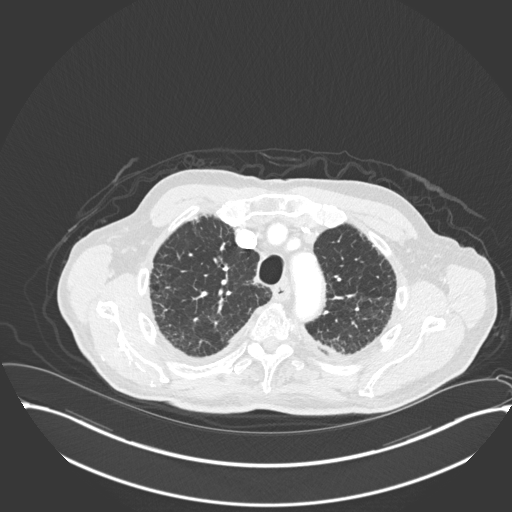
[im 144/170  lung]
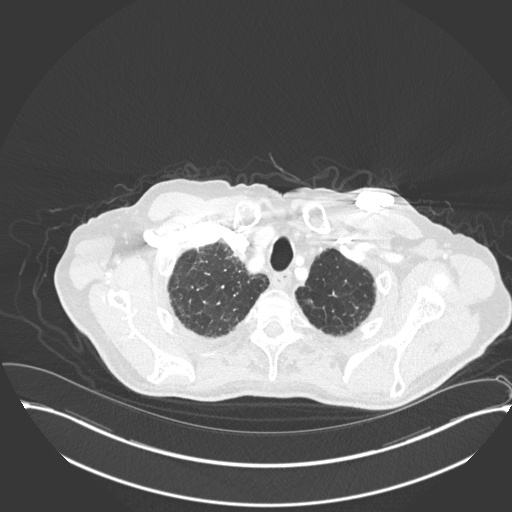
[im 157/170  lung]
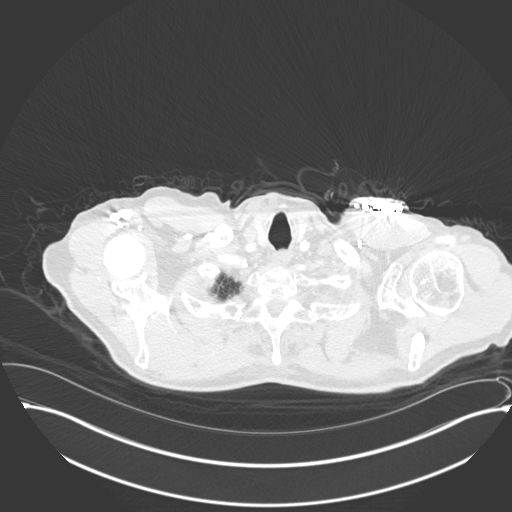

[Series 6: coronal · coronal · 0.71mm/px · 3 of 138 slices shown]
[im 28/138  lung]
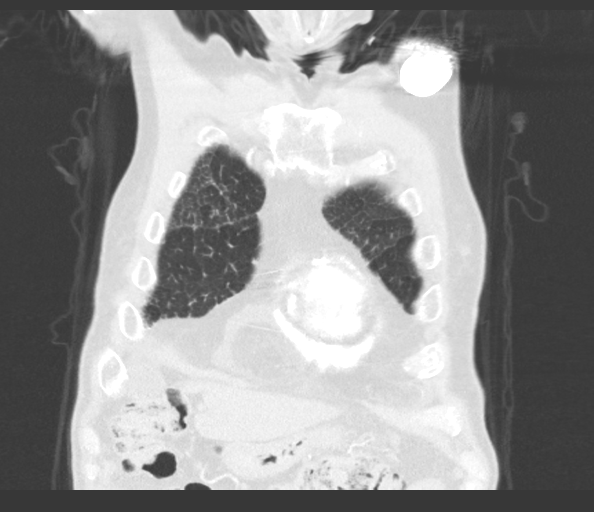
[im 55/138  lung]
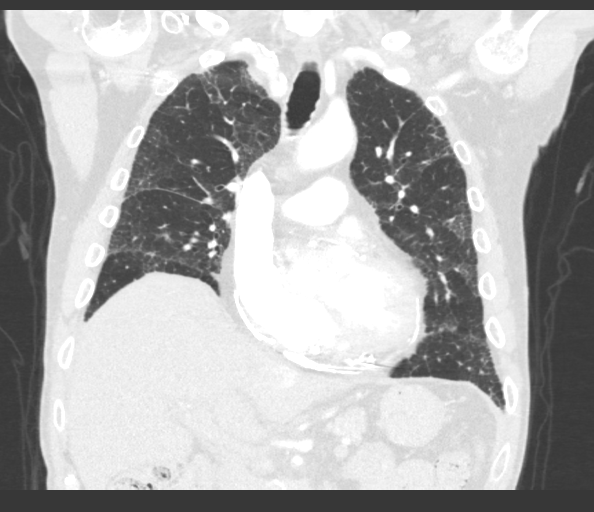
[im 83/138  lung]
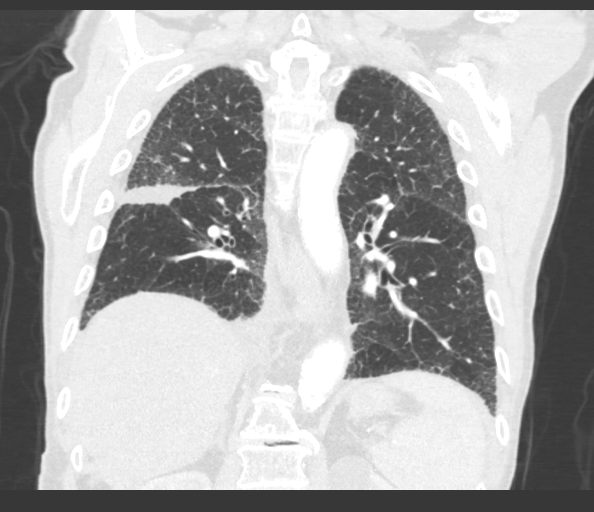

[15 of 36 positions shown; findings below may reference images not displayed]

FINDINGS: Cardiovascular: The heart size is normal. No substantial pericardial
effusion. Coronary artery calcification is evident. Marked
pericardial calcification again noted. Ascending thoracic aorta
measures 4.1 cm diameter. Left permanent pacemaker noted.

Mediastinum/Nodes: No left hilar lymphadenopathy. Right hilum shows
progressive lymph nodes measuring up to 13 mm short axis (67/2). 9
mm short axis subcarinal node measured previously is 13 mm today
(70/2). The esophagus has normal imaging features. There is no
axillary lymphadenopathy.

Lungs/Pleura: The small bilateral pleural effusions seen previously
have decreased in the interval. Subpleural reticulation in the lungs
bilaterally again noted suggesting underlying component of pulmonary
fibrosis. Staple line again noted posterior right lower lobe no
suspicious pulmonary nodule or mass.

Upper Abdomen: Small cyst again noted upper pole left kidney.
Calcified thrombus versus chronic dissection of the juxtarenal
abdominal aorta again noted, incompletely visualized.

Musculoskeletal: No worrisome lytic or sclerotic osseous
abnormality. Compression deformity at T7 and T11 is stable in the
interval.
IMPRESSION: 1. Interval decrease in size of the small bilateral pleural
effusions seen previously.
2. Interval progression of right hilar and subcarinal lymph nodes,
now mildly enlarged. These may be reactive, but follow-up
recommended to ensure stability. Consider repeat CT chest in 3
months. Stable compression fractures at T7 and T11.
3. Pericardial calcification, as before.
4. 4.1 cm ascending thoracic aorta. Recommend annual imaging
followup by CTA or MRA. This recommendation follows [BK]
ACCF/AHA/AATS/ACR/ASA/SCA/EKA PUTRI/EKA PUTRI/EKA PUTRI/EKA PUTRI Guidelines for the
Diagnosis and Management of Patients with Thoracic Aortic Disease.
Circulation. [BK]; 121: [id number]. Aortic aneurysm NOS ([BK]-[BK])
5. Aortic Atherosclerosis ([BK]-[BK]).

## 2020-10-14 MED ORDER — IOHEXOL 300 MG/ML  SOLN
75.0000 mL | Freq: Once | INTRAMUSCULAR | Status: AC | PRN
  Administered 2020-10-14: 75 mL via INTRAVENOUS

## 2020-10-15 DIAGNOSIS — Z950 Presence of cardiac pacemaker: Secondary | ICD-10-CM | POA: Insufficient documentation

## 2020-10-16 ENCOUNTER — Inpatient Hospital Stay (HOSPITAL_BASED_OUTPATIENT_CLINIC_OR_DEPARTMENT_OTHER): Payer: Medicare Other | Admitting: Internal Medicine

## 2020-10-16 ENCOUNTER — Other Ambulatory Visit: Payer: Self-pay

## 2020-10-16 ENCOUNTER — Encounter: Payer: Self-pay | Admitting: Internal Medicine

## 2020-10-16 DIAGNOSIS — Z8719 Personal history of other diseases of the digestive system: Secondary | ICD-10-CM | POA: Diagnosis not present

## 2020-10-16 DIAGNOSIS — K219 Gastro-esophageal reflux disease without esophagitis: Secondary | ICD-10-CM | POA: Diagnosis not present

## 2020-10-16 DIAGNOSIS — R1011 Right upper quadrant pain: Secondary | ICD-10-CM | POA: Diagnosis not present

## 2020-10-16 DIAGNOSIS — Z9221 Personal history of antineoplastic chemotherapy: Secondary | ICD-10-CM | POA: Diagnosis not present

## 2020-10-16 DIAGNOSIS — C3431 Malignant neoplasm of lower lobe, right bronchus or lung: Secondary | ICD-10-CM | POA: Diagnosis not present

## 2020-10-16 DIAGNOSIS — C3490 Malignant neoplasm of unspecified part of unspecified bronchus or lung: Secondary | ICD-10-CM | POA: Diagnosis not present

## 2020-10-16 DIAGNOSIS — Z8673 Personal history of transient ischemic attack (TIA), and cerebral infarction without residual deficits: Secondary | ICD-10-CM | POA: Diagnosis not present

## 2020-10-16 DIAGNOSIS — Z7901 Long term (current) use of anticoagulants: Secondary | ICD-10-CM | POA: Diagnosis not present

## 2020-10-16 DIAGNOSIS — I509 Heart failure, unspecified: Secondary | ICD-10-CM | POA: Diagnosis not present

## 2020-10-16 DIAGNOSIS — E7800 Pure hypercholesterolemia, unspecified: Secondary | ICD-10-CM | POA: Diagnosis not present

## 2020-10-16 DIAGNOSIS — I110 Hypertensive heart disease with heart failure: Secondary | ICD-10-CM | POA: Diagnosis not present

## 2020-10-16 DIAGNOSIS — M4854XA Collapsed vertebra, not elsewhere classified, thoracic region, initial encounter for fracture: Secondary | ICD-10-CM | POA: Diagnosis not present

## 2020-10-16 DIAGNOSIS — Z79899 Other long term (current) drug therapy: Secondary | ICD-10-CM | POA: Diagnosis not present

## 2020-10-16 NOTE — Progress Notes (Signed)
[place] [phone]   [phone]  OFFICE PROGRESS NOTE  [name], MD [place] [address]  DIAGNOSIS: Stage IB (T2 a, N0, M0) small cell lung cancer presented with superior segment right lower lobe lung nodule.  PRIOR THERAPY:  1) Status post right lower lobe superior segmentectomy with lymph node sampling on March 12, 2020 under the care of Dr. Servando Snare. 2) Systemic chemotherapy with carboplatin for AUC of 5 on day 1 and etoposide 100 mg/M2 on days 1, 2 and 3 with Neulasta support every 3 weeks.  Status post 4 cycles.  CURRENT THERAPY: Observation.  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 male returns to the clinic today for follow-up visit.  The patient is feeling fine today with no concerning complaints.  He denied having any current chest pain, shortness of breath, cough or hemoptysis.  He denied having any fever or chills.  He has no nausea, vomiting, diarrhea or constipation.  He has no headache or visual changes.  He has occasional right upper quadrant abdominal pain.  He is currently on Lipitor and he thinks that it may have been contributing to his liver enzyme abnormality.  The patient has no significant weight loss.  He has been in observation.  He had repeat CT scan of the chest performed recently and he is here for evaluation and discussion of his scan results.   MEDICAL HISTORY: Past Medical History:  Diagnosis Date  . Allergy   . Arthritis    "right knee and right thumb" (12/19/2014)  . Asthma    pt denies  . Bronchitis 07/2016   started in December and continued about 2 months  . Central retinal artery occlusion 06/26/2013  . CHF (congestive heart failure) ([place]) 08/24/2020  . Esophageal stricture   . GERD (gastroesophageal reflux disease)   . Hemochromatosis   . Hiatal hernia   . Hypercalcemia   . Hypercholesteremia   . Hypertension   . Internal hemorrhoids   . Lung cancer ([facility])   .  Osteoarthritis   . Palpitations   . Persistent atrial fibrillation ([place])   . Presence of permanent cardiac pacemaker    hx bradycardia  . Retinal artery occlusion, branch    "right eye"  . Second degree AV block, Mobitz type II   . Skin cancer    right shoulder  . Small cell lung cancer, right lower lobe ([place]) 03/12/2020  . Stroke [facility]) ~ 2011   "right eye"/ partial blindness  . Tubular adenoma of colon     ALLERGIES:  is allergic to lisinopril.  MEDICATIONS:  Current Outpatient Medications  Medication Sig Dispense Refill  . acetaminophen (TYLENOL) 500 MG tablet Take 500-1,000 mg by mouth every 6 (six) hours as needed (for pain).    Marland Kitchen atorvastatin (LIPITOR) 20 MG tablet Take 1 tablet (20 mg total) by mouth daily. 30 tablet 3  . celecoxib (CELEBREX) 200 MG capsule Take 1 capsule (200 mg total) by mouth 2 (two) times daily as needed for mild pain. 10 capsule 0  . dofetilide (TIKOSYN) 250 MCG capsule Take 1 capsule (250 mcg total) by mouth 2 (two) times daily. 60 capsule 6  . doxazosin (CARDURA) 4 MG tablet Take 4 mg by mouth daily.     Marland Kitchen esomeprazole (NEXIUM) 20 MG capsule Take 20 mg by mouth daily before breakfast.    . fluticasone (FLONASE) 50 MCG/ACT nasal spray Place 1 spray into both nostrils daily as needed  for allergies or rhinitis.    . Glycerin-Hypromellose-PEG 400 (DRY EYE RELIEF DROPS) 0.2-0.2-1 % SOLN Place 1 drop into both eyes daily as needed (for dryness). Saline    . HYDROcodone-homatropine (HYCODAN) 5-1.5 MG/5ML syrup Take 5 mLs by mouth every 6 (six) hours as needed for cough.    . metoprolol succinate (TOPROL XL) 25 MG 24 hr tablet Take 0.5 tablets (12.5 mg total) by mouth at bedtime. 30 tablet 3  . montelukast (SINGULAIR) 10 MG tablet Take 10 mg by mouth every morning.     . Multiple Vitamins-Minerals (PRESERVISION AREDS 2 PO) Take 1 capsule by mouth in the morning and at bedtime.     . potassium chloride SA (KLOR-CON) 20 MEQ tablet Take 2 tablets (40 mEq total) by  mouth 2 (two) times daily. Take an extra tablet (20 meq) anytime you take an extra torsemide 130 tablet 6  . rivaroxaban (XARELTO) 20 MG TABS tablet Take 1 tablet (20 mg total) by mouth daily with supper. 30 tablet 11  . torsemide (DEMADEX) 20 MG tablet Take 1 tablet (20 mg total) by mouth daily. Take extra 20 mg as needed for weight gain of 3 lbs overnight or 5 lbs within one week. 60 tablet 2  . vitamin C (ASCORBIC ACID) 500 MG tablet Take 500 mg by mouth at bedtime.      No current facility-administered medications for this visit.    SURGICAL HISTORY:  Past Surgical History:  Procedure Laterality Date  . CARDIOVERSION N/A 07/29/2020   Procedure: CARDIOVERSION;  Surgeon: Jerline Pain, MD;  Location: [facility];  Service: Cardiovascular;  Laterality: N/A;  NEEDS RAPID COVID TEST MORNING OF  . CARDIOVERSION N/A 08/19/2020   Procedure: CARDIOVERSION;  Surgeon: Thayer Headings, MD;  Location: [facility];  Service: Cardiovascular;  Laterality: N/A;  . CATARACT EXTRACTION, BILATERAL  01/2017  . COLONOSCOPY    . EP IMPLANTABLE DEVICE N/A 12/19/2014   Procedure: Pacemaker Implant;  Surgeon: Deboraha Sprang, MD;  Location: [facility];  Service: Cardiovascular;  Laterality: N/A;  . ESOPHAGOGASTRODUODENOSCOPY (EGD) WITH ESOPHAGEAL DILATION  X 3  . EYE SURGERY Bilateral 2017  . INGUINAL HERNIA REPAIR Left 1980's  . INSERT / REPLACE / REMOVE PACEMAKER  12/19/2014  . INTERCOSTAL NERVE BLOCK Right 03/11/2020   Procedure: INTERCOSTAL NERVE BLOCK;  Surgeon: Grace Isaac, MD;  Location: [facility];  Service: Thoracic;  Laterality: Right;  . JOINT REPLACEMENT Right 2016  . KNEE ARTHROSCOPY Right ~ 1982; ~ 1992  . MOHS SURGERY Right ~ 2014   "pre-melanoma scapula"  . POSTERIOR TIBIAL TENDON REPAIR Left 2012  . TONSILLECTOMY  1946  . TOTAL KNEE ARTHROPLASTY Right 05/20/2015   Procedure: RIGHT TOTAL KNEE ARTHROPLASTY;  Surgeon: Paralee Cancel, MD;  Location: [facility];  Service: Orthopedics;   Laterality: Right;  . UPPER GASTROINTESTINAL ENDOSCOPY    . VIDEO BRONCHOSCOPY N/A 03/11/2020   Procedure: VIDEO BRONCHOSCOPY;  Surgeon: Grace Isaac, MD;  Location: [facility];  Service: Thoracic;  Laterality: N/A;  . XI ROBOTIC ASSISTED THORACOSCOPY- SEGMENTECTOMY Right 03/11/2020   Procedure: XI ROBOTIC ASSISTED THORACOSCOPY-RIGHT SUPERIOR SEGMENTECTOMY WITH NODE SAMPLES;  Surgeon: Grace Isaac, MD;  Location: [facility];  Service: Thoracic;  Laterality: Right;    REVIEW OF SYSTEMS:  Constitutional: positive for fatigue Eyes: negative Ears, nose, mouth, throat, and face: negative Respiratory: negative Cardiovascular: negative Gastrointestinal: positive for abdominal pain Genitourinary:negative Integument/breast: negative Hematologic/lymphatic: negative Musculoskeletal:negative Neurological: negative Behavioral/Psych: negative Endocrine: negative Allergic/Immunologic: negative  PHYSICAL EXAMINATION: General appearance: alert, cooperative, fatigued and no distress Head: Normocephalic, without obvious abnormality, atraumatic Neck: no adenopathy, no JVD, supple, symmetrical, trachea midline and thyroid not enlarged, symmetric, no tenderness/mass/nodules Lymph nodes: Cervical, supraclavicular, and axillary nodes normal. Resp: clear to auscultation bilaterally Back: symmetric, no curvature. ROM normal. No CVA tenderness. Cardio: regular rate and rhythm, S1, S2 normal, no murmur, click, rub or gallop GI: soft, non-tender; bowel sounds normal; no masses,  no organomegaly Extremities: extremities normal, atraumatic, no cyanosis or edema Neurologic: Alert and oriented X 3, normal strength and tone. Normal symmetric reflexes. Normal coordination and gait  ECOG PERFORMANCE STATUS: 1 - Symptomatic but completely ambulatory  Blood pressure 118/72, pulse 82, temperature (!) 97.5 F (36.4 C), temperature source Tympanic, resp. rate 18, height 5\' 10"  (1.778 m), weight 158 lb 6.4 oz (71.8 kg),  SpO2 100 %.  LABORATORY DATA: Lab Results  Component Value Date   WBC 5.6 10/14/2020   HGB 12.3 (L) 10/14/2020   HCT 37.9 (L) 10/14/2020   MCV 89.4 10/14/2020   PLT 145 (L) 10/14/2020      Chemistry      Component Value Date/Time   NA 139 10/14/2020 0958   NA 142 10/03/2020 1340   K 4.2 10/14/2020 0958   CL 105 10/14/2020 0958   CO2 28 10/14/2020 0958   BUN 19 10/14/2020 0958   BUN 18 10/03/2020 1340   CREATININE 1.09 10/14/2020 0958      Component Value Date/Time   CALCIUM 10.5 (H) 10/14/2020 0958   ALKPHOS 399 (H) 10/14/2020 0958   AST 26 10/14/2020 0958   ALT 20 10/14/2020 0958   BILITOT 1.9 (H) 10/14/2020 0958       RADIOGRAPHIC STUDIES: CT Chest W Contrast  Result Date: 10/14/2020 CLINICAL DATA:  Small-cell lung cancer.  Staging. EXAM: CT CHEST WITH CONTRAST TECHNIQUE: Multidetector CT imaging of the chest was performed during intravenous contrast administration. CONTRAST:  61mL OMNIPAQUE IOHEXOL 300 MG/ML  SOLN COMPARISON:  CTA chest 06/26/2020 FINDINGS: Cardiovascular: The heart size is normal. No substantial pericardial effusion. Coronary artery calcification is evident. Marked pericardial calcification again noted. Ascending thoracic aorta measures 4.1 cm diameter. Left permanent pacemaker noted. Mediastinum/Nodes: No left hilar lymphadenopathy. Right hilum shows progressive lymph nodes measuring up to 13 mm short axis (67/2). 9 mm short axis subcarinal node measured previously is 13 mm today (70/2). The esophagus has normal imaging features. There is no axillary lymphadenopathy. Lungs/Pleura: The small bilateral pleural effusions seen previously have decreased in the interval. Subpleural reticulation in the lungs bilaterally again noted suggesting underlying component of pulmonary fibrosis. Staple line again noted posterior right lower lobe no suspicious pulmonary nodule or mass. Upper Abdomen: Small cyst again noted upper pole left kidney. Calcified thrombus versus  chronic dissection of the juxtarenal abdominal aorta again noted, incompletely visualized. Musculoskeletal: No worrisome lytic or sclerotic osseous abnormality. Compression deformity at T7 and T11 is stable in the interval. IMPRESSION: 1. Interval decrease in size of the small bilateral pleural effusions seen previously. 2. Interval progression of right hilar and subcarinal lymph nodes, now mildly enlarged. These may be reactive, but follow-up recommended to ensure stability. Consider repeat CT chest in 3 months. Stable compression fractures at T7 and T11. 3. Pericardial calcification, as before. 4. 4.1 cm ascending thoracic aorta. Recommend annual imaging followup by CTA or MRA. This recommendation follows 2010 ACCF/AHA/AATS/ACR/ASA/SCA/SCAI/SIR/STS/SVM Guidelines for the Diagnosis and Management of Patients with Thoracic Aortic Disease. Circulation. 2010; 121: N361-W431. Aortic aneurysm NOS (ICD10-I71.9) 5.  Aortic Atherosclerosis (ICD10-I70.0). Electronically Signed   By: Misty Stanley M.D.   On: 10/14/2020 14:09   CUP PACEART INCLINIC DEVICE CHECK  Result Date: 10/03/2020 Pacemaker check in clinic. Normal device function. Thresholds, sensing, impedances consistent with previous measurements. Device programmed to maximize longevity. AF 10%, but none since tikosyn admission in January. Device programmed at appropriate safety margins. Histogram distribution appropriate for patient activity level. Estimated longevity 3 yr, 2 mo. Patient enrolled in remote follow-up. Patient education completed.  CUP PACEART REMOTE DEVICE CHECK  Result Date: 09/23/2020 Scheduled remote reviewed. Normal device function.  AF burden 13.7%; longest logged >99 hours; EGMs suggest A. Fib; V rates controlled. History of AF and dofetilide, metoprolol and rivaroxaban. Next remote 91 days. HB   ASSESSMENT AND PLAN: This is a very pleasant 80 years old white male diagnosed with a stage Ib (T2 a, N0, M0) small cell lung cancer presented  with superior segment right lower lobe nodule status post right lower lobe superior segmentectomy with lymph node dissection in August 2021 under the care of Dr. Servando Snare. The patient completed a course of adjuvant treatment with systemic chemotherapy with carboplatin for AUC of 5 on day 1 and etoposide 100 mg/M2 on days 1, 2 and 3 with Neulasta support.  He status post 4 cycles. The patient is currently on observation and he is feeling fine except for fatigue and intermittent right upper quadrant abdominal pain. He had repeat CT scan of the chest performed recently.  I personally and independently reviewed the scan images and discussed the results with the patient today. His scan showed interval decrease in the size of the small bilateral pleural effusions that was seen previously but there was interval progression of right hilar and subcarinal lymph nodes mildly enlarged with.  There was a stable compression fracture of T7 and T11. I had a lengthy discussion with the patient about his current condition.  In the presence of liver dysfunction and the suspicious right hilar and subcarinal lymphadenopathy, I recommended for the patient to have a PET scan to rule out any disease recurrence in the chest or liver. I will see him back for follow-up visit in 3 weeks for evaluation and discussion of his PET scan results and further recommendation regarding his condition. The patient was advised to call immediately if he has any other concerning symptoms in the interval. The patient voices understanding of current disease status and treatment options and is in agreement with the current care plan.  All questions were answered. The patient knows to call the clinic with any problems, questions or concerns. We can certainly see the patient much sooner if necessary.  Disclaimer: This note was dictated with voice recognition software. Similar sounding words can inadvertently be transcribed and may not be corrected  upon review.

## 2020-10-16 NOTE — Progress Notes (Signed)
[address]       [place] [address]             [phone]      Alexander Murray, Dr. [facility] Medical Record #[id number] Date of Birth: 06-10-1941  Referring: [name], MD Primary Care: [name], MD Primary Cardiologist: No primary care provider on file.   Chief Complaint:   POST OP FOLLOW UP OPERATIVE REPORT DATE OF PROCEDURE:  03/11/2020 PREOPERATIVE DIAGNOSIS:  Lung nodule superior segment right lower lobe. POSTOPERATIVE DIAGNOSIS:  Neuroendocrine tumor by frozen section.  Final path pending. PROCEDURE PERFORMED:  Video bronchoscopy, robotic-assisted right superior segmentectomy with lymph node sampling and intercostal nerve block.  SURGICAL PATHOLOGY  THIS IS AN ADDENDUM REPORT  CASE: [id number]  PATIENT: Alexander Murray  Surgical Pathology Report  Addendum   Reason for Addendum #1: Report Clarification   Clinical History: pulmonary nodule (cm)   FINAL MICROSCOPIC DIAGNOSIS:   A. LYMPH NODE, 8, EXCISION:  - Lymph node, negative for carcinoma (0/1)   B. 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 LYMPH NODE, 10R, EXCISION:  - Lymph node, negative for carcinoma (0/1)   D. LYMPH NODE, 11, EXCISION:  - Fragments of lymph node, negative for carcinoma (0/1)   DIAGNOSIS: Stage IB (T2 a, N0, M0) small cell lung cancer presented with superior segment right lower lobe lung nodule.  PRIOR THERAPY: status post right lower lobe superior segmentectomy with lymph node sampling on March 12, 2020   PRIOR THERAPY:  1) Status post right lower lobe superior segmentectomy with lymph node sampling on March 12, 2020 under the care of Dr. Servando Snare. 2) Systemic chemotherapy with carboplatin for AUC of 5 on day 1 and etoposide  100 mg/M2 on days 1, 2 and 3 with Neulasta support every 3 weeks.  Status post 4 cycles.  CURRENT THERAPY: Observation.   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  He notes that he had tolerated the first 4cycles of chemo well,  Following completion of his chemotherapy he had increasing signs and symptoms of congestive heart failure was having episodes of atrial fibrillation.  He notes that now on Tikosyn his heart rhythm has been more stable.  He continues on diuretic therapy but overall his functional status has improved.  Follow-up CT scan was done March 14-PET scan is scheduled in 2 weeks because of some slight increase in right hilar adenopathy and subcarinal adenopathy.     Past Medical History:  Diagnosis Date  . Allergy   . Arthritis    "right knee and right thumb" (12/19/2014)  . Asthma    pt denies  . Bronchitis 07/2016   started in December and continued about 2 months  . Central retinal artery occlusion 06/26/2013  . CHF (congestive heart failure) ([place]) 08/24/2020  . Esophageal stricture   . GERD (gastroesophageal reflux disease)   . Hemochromatosis   . Hiatal hernia   . Hypercalcemia   . Hypercholesteremia   . Hypertension   . Internal hemorrhoids   . Lung cancer ([place])   . Osteoarthritis   . Palpitations   .  Persistent atrial fibrillation ([place])   . Presence of permanent cardiac pacemaker    hx bradycardia  . Retinal artery occlusion, branch    "right eye"  . Second degree AV block, Mobitz type II   . Skin cancer    right shoulder  . Small cell lung cancer, right lower lobe ([place]) 03/12/2020  . Stroke [facility]) ~ 2011   "right eye"/ partial blindness  . Tubular adenoma of colon      Social History   Tobacco Use  Smoking Status Former Smoker  . Years: 3.00  . Types: Cigarettes  . Quit date: 05/16/1962  . Years since  quitting: 58.4  Smokeless Tobacco Never Used  Tobacco Comment   "quit smoking in the 1960's"; was an intermittent smoker     Social History   Substance and Sexual Activity  Alcohol Use No   Comment: 12/19/2014 "stopped drinking in ~ 2012"     Allergies  Allergen Reactions  . Lisinopril Palpitations and Other (See Comments)    Dizziness, also    Current Outpatient Medications  Medication Sig Dispense Refill  . acetaminophen (TYLENOL) 500 MG tablet Take 500-1,000 mg by mouth every 6 (six) hours as needed (for pain).    Marland Kitchen atorvastatin (LIPITOR) 20 MG tablet Take 1 tablet (20 mg total) by mouth daily. 30 tablet 3  . celecoxib (CELEBREX) 200 MG capsule Take 1 capsule (200 mg total) by mouth 2 (two) times daily as needed for mild pain. 10 capsule 0  . dofetilide (TIKOSYN) 250 MCG capsule Take 1 capsule (250 mcg total) by mouth 2 (two) times daily. 60 capsule 6  . doxazosin (CARDURA) 4 MG tablet Take 4 mg by mouth daily.     Marland Kitchen esomeprazole (NEXIUM) 20 MG capsule Take 20 mg by mouth daily before breakfast.    . fluticasone (FLONASE) 50 MCG/ACT nasal spray Place 1 spray into both nostrils daily as needed for allergies or rhinitis.    . Glycerin-Hypromellose-PEG 400 (DRY EYE RELIEF DROPS) 0.2-0.2-1 % SOLN Place 1 drop into both eyes daily as needed (for dryness). Saline    . HYDROcodone-homatropine (HYCODAN) 5-1.5 MG/5ML syrup Take 5 mLs by mouth every 6 (six) hours as needed for cough.    . metoprolol succinate (TOPROL XL) 25 MG 24 hr tablet Take 0.5 tablets (12.5 mg total) by mouth at bedtime. 30 tablet 3  . montelukast (SINGULAIR) 10 MG tablet Take 10 mg by mouth every morning.     . Multiple Vitamins-Minerals (PRESERVISION AREDS 2 PO) Take 1 capsule by mouth in the morning and at bedtime.     . potassium chloride SA (KLOR-CON) 20 MEQ tablet Take 2 tablets (40 mEq total) by mouth 2 (two) times daily. Take an extra tablet (20 meq) anytime you take an extra torsemide 130 tablet 6  .  rivaroxaban (XARELTO) 20 MG TABS tablet Take 1 tablet (20 mg total) by mouth daily with supper. 30 tablet 11  . torsemide (DEMADEX) 20 MG tablet Take 1 tablet (20 mg total) by mouth daily. Take extra 20 mg as needed for weight gain of 3 lbs overnight or 5 lbs within one week. 60 tablet 2  . vitamin C (ASCORBIC ACID) 500 MG tablet Take 500 mg by mouth at bedtime.      No current facility-administered medications for this visit.       Physical Exam: BP 115/70 (BP Location: Left Arm, Patient Position: Sitting)   Pulse 76   Resp 20   Ht 5\' 10"  (1.778  m)   Wt 158 lb (71.7 kg)   SpO2 92% Comment: RA  BMI 22.67 kg/m   General appearance: alert, cooperative and no distress Head: Normocephalic, without obvious abnormality, atraumatic Neck: no adenopathy, no carotid bruit, no JVD, supple, symmetrical, trachea midline and thyroid not enlarged, symmetric, no tenderness/mass/nodules Lymph nodes: Cervical, supraclavicular, and axillary nodes normal. Resp: clear to auscultation bilaterally Cardio: regular rate and rhythm, S1, S2 normal, no murmur, click, rub or gallop Extremities: extremities normal, atraumatic, no cyanosis or edema Neurologic: Grossly normal Pacemaker pocket is intact  Diagnostic Studies & Laboratory data:     Recent Radiology Findings:  CT Chest W Contrast  Result Date: 10/14/2020 CLINICAL DATA:  Small-cell lung cancer.  Staging. EXAM: CT CHEST WITH CONTRAST TECHNIQUE: Multidetector CT imaging of the chest was performed during intravenous contrast administration. CONTRAST:  63mL OMNIPAQUE IOHEXOL 300 MG/ML  SOLN COMPARISON:  CTA chest 06/26/2020 FINDINGS: Cardiovascular: The heart size is normal. No substantial pericardial effusion. Coronary artery calcification is evident. Marked pericardial calcification again noted. Ascending thoracic aorta measures 4.1 cm diameter. Left permanent pacemaker noted. Mediastinum/Nodes: No left hilar lymphadenopathy. Right hilum shows  progressive lymph nodes measuring up to 13 mm short axis (67/2). 9 mm short axis subcarinal node measured previously is 13 mm today (70/2). The esophagus has normal imaging features. There is no axillary lymphadenopathy. Lungs/Pleura: The small bilateral pleural effusions seen previously have decreased in the interval. Subpleural reticulation in the lungs bilaterally again noted suggesting underlying component of pulmonary fibrosis. Staple line again noted posterior right lower lobe no suspicious pulmonary nodule or mass. Upper Abdomen: Small cyst again noted upper pole left kidney. Calcified thrombus versus chronic dissection of the juxtarenal abdominal aorta again noted, incompletely visualized. Musculoskeletal: No worrisome lytic or sclerotic osseous abnormality. Compression deformity at T7 and T11 is stable in the interval. IMPRESSION: 1. Interval decrease in size of the small bilateral pleural effusions seen previously. 2. Interval progression of right hilar and subcarinal lymph nodes, now mildly enlarged. These may be reactive, but follow-up recommended to ensure stability. Consider repeat CT chest in 3 months. Stable compression fractures at T7 and T11. 3. Pericardial calcification, as before. 4. 4.1 cm ascending thoracic aorta. Recommend annual imaging followup by CTA or MRA. This recommendation follows 2010 ACCF/AHA/AATS/ACR/ASA/SCA/SCAI/SIR/STS/SVM Guidelines for the Diagnosis and Management of Patients with Thoracic Aortic Disease. Circulation. 2010; 121: F093-A355. Aortic aneurysm NOS (ICD10-I71.9) 5. Aortic Atherosclerosis (ICD10-I70.0). Electronically Signed   By: Misty Stanley M.D.   On: 10/14/2020 14:09   CT ANGIO CHEST PE W OR WO CONTRAST  Result Date: 06/26/2020 CLINICAL DATA:  Chest pain, shortness of breath. History of small-cell lung cancer 8/upper lobe. 21 status post right lower lobectomy and chemotherapy. Difficulty breathing and chest pain for 1 week. EXAM: CT ANGIOGRAPHY CHEST WITH  CONTRAST TECHNIQUE: Multidetector CT imaging of the chest was performed using the standard protocol during bolus administration of intravenous contrast. Multiplanar CT image reconstructions and MIPs were obtained to evaluate the vascular anatomy. CONTRAST:  156mL OMNIPAQUE IOHEXOL 350 MG/ML SOLN COMPARISON:  CT abdomen pelvis 05/16/2020, PET CT 02/09/2020 FINDINGS: Lines and tubes: Left chest wall 2 lead cardiac pacemaker with leads terminating in the right ventricle and right atrium. Cardiovascular: Satisfactory opacification of the pulmonary arteries to the segmental level. No evidence of pulmonary embolism. The main pulmonary artery is normal in caliber. Normal heart size. Redemonstration of dense pericardial calcifications no associated significant pericardial fluid. Mild aortic valve leaflet calcifications. Stable ascending thoracic aorta caliber measuring up  to 4.3 cm on axial imaging (4:45). The descending thoracic aorta is normal in caliber. Moderate atherosclerotic plaque of the aorta and its major branches. At least moderate four-vessel coronary artery calcifications. Mediastinum/Nodes: Several prominent but no definite enlarged mediastinal lymph nodes. No enlarged mediastinal, hilar, or axillary lymph nodes. Thyroid gland, trachea, and esophagus demonstrate no significant findings. Lungs/Pleura: Limited evaluation of the lung parenchyma due to motion artifact. Status post right lower lobectomy. Expiratory phase of respiration. Linear atelectasis versus scarring within the right upper lobe. No focal consolidation. No definite pulmonary nodule or mass. Interval increase in a trace to small volume right pleural effusion. Interval development of a trace to small volume left pleural effusion. No pneumothorax. Upper Abdomen: Severe atherosclerotic plaque of the abdominal aorta. Suggestion of a similar appearing focal suprarenal abdominal aorta dissection (4:113). Musculoskeletal: No chest wall abnormality No  suspicious lytic or blastic osseous lesions. No acute displaced fracture. Multilevel degenerative changes of the spine. Chronic appearing mild T7 vertebral body compression fracture. Chronic appearing T11 compression fracture with greater than 80% height loss. Review of the MIP images confirms the above findings. IMPRESSION: 1.  No pulmonary embolus. 2. Interval increase in size of a small right pleural effusion. Interval development of a left trace to small pleural effusion. 3. Chronic appearing T7 and T11 compression fractures. Correlate with midline tenderness to palpation to evaluate for an acute component. 4. Chronic pericardial calcification. 5. Stable ascending aorta aneurysm measuring up to 4.3 cm. Possible stable focal chronic suprarenal abdominal aorta dissection. 6.  Aortic Atherosclerosis (ICD10-I70.0). Electronically Signed   By: Iven Finn M.D.   On: 06/26/2020 18:20       Recent Lab Findings: Lab Results  Component Value Date   WBC 5.6 10/14/2020   HGB 12.3 (L) 10/14/2020   HCT 37.9 (L) 10/14/2020   PLT 145 (L) 10/14/2020   GLUCOSE 111 (H) 10/14/2020   ALT 20 10/14/2020   AST 26 10/14/2020   NA 139 10/14/2020   K 4.2 10/14/2020   CL 105 10/14/2020   CREATININE 1.09 10/14/2020   BUN 19 10/14/2020   CO2 28 10/14/2020   TSH 5.630 (H) 08/23/2020   INR 6.5 ([place]) 08/29/2020      Assessment / Plan:   #1 status post superior segment ectomy right lower lobe-4 small cell carcinoma of the lung now has completed 4 cycles of chemo-follow-up CT scan is suggestive interval increase in size of right hilar and subcarinal lymph nodes-is unclear if this is inflammatory or could be progression of small cell carcinoma-PET scan is scheduled for 2 weeks  #2 previous increasing heart failure symptoms have improved with diuresis and control of atrial fibrillation .   #3 follow-up with medical oncology following PET scan-currently the patient does not have a specific follow-up appointment  surgical office but could be seen at any time at cancer Dr. Worthy Flank request   Medication Changes: No orders of the defined types were placed in this encounter.     Grace Isaac MD      [address] [place] [address] Office [phone]     10/17/2020 2:10 PM

## 2020-10-17 ENCOUNTER — Encounter: Payer: Self-pay | Admitting: *Deleted

## 2020-10-17 ENCOUNTER — Encounter: Payer: Self-pay | Admitting: Cardiothoracic Surgery

## 2020-10-17 ENCOUNTER — Ambulatory Visit (INDEPENDENT_AMBULATORY_CARE_PROVIDER_SITE_OTHER): Payer: Medicare Other | Admitting: Cardiothoracic Surgery

## 2020-10-17 VITALS — BP 115/70 | HR 76 | Resp 20 | Ht 70.0 in | Wt 158.0 lb

## 2020-10-17 DIAGNOSIS — C3431 Malignant neoplasm of lower lobe, right bronchus or lung: Secondary | ICD-10-CM | POA: Diagnosis not present

## 2020-10-17 NOTE — Progress Notes (Signed)
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 Dr. Julien Nordmann ordered PET scan. It is auth per insurance and scheduled for 3/31.  Dr. Julien Nordmann will see him after PET scan to go over results and treatment plan.

## 2020-10-28 ENCOUNTER — Encounter: Payer: Self-pay | Admitting: Internal Medicine

## 2020-10-28 ENCOUNTER — Ambulatory Visit (INDEPENDENT_AMBULATORY_CARE_PROVIDER_SITE_OTHER): Payer: Medicare Other | Admitting: Internal Medicine

## 2020-10-28 ENCOUNTER — Other Ambulatory Visit: Payer: Self-pay

## 2020-10-28 VITALS — BP 112/78 | HR 79 | Ht 70.0 in | Wt 156.0 lb

## 2020-10-28 DIAGNOSIS — Z950 Presence of cardiac pacemaker: Secondary | ICD-10-CM

## 2020-10-28 DIAGNOSIS — I5032 Chronic diastolic (congestive) heart failure: Secondary | ICD-10-CM

## 2020-10-28 DIAGNOSIS — I442 Atrioventricular block, complete: Secondary | ICD-10-CM | POA: Diagnosis not present

## 2020-10-28 DIAGNOSIS — I4819 Other persistent atrial fibrillation: Secondary | ICD-10-CM | POA: Diagnosis not present

## 2020-10-28 DIAGNOSIS — Z79899 Other long term (current) drug therapy: Secondary | ICD-10-CM

## 2020-10-28 MED ORDER — TORSEMIDE 20 MG PO TABS: ORAL_TABLET | ORAL | 2 refills | Status: DC

## 2020-10-28 MED ORDER — POTASSIUM CHLORIDE CRYS ER 20 MEQ PO TBCR: 20.0000 meq | EXTENDED_RELEASE_TABLET | Freq: Every day | ORAL | 3 refills | Status: DC

## 2020-10-28 MED ORDER — POTASSIUM CHLORIDE CRYS ER 20 MEQ PO TBCR: EXTENDED_RELEASE_TABLET | ORAL | 3 refills | Status: DC

## 2020-10-28 NOTE — Progress Notes (Signed)
Patient Care Team: [name], MD as PCP - General (Internal Medicine) Deboraha Sprang, MD as PCP - Electrophysiology (Cardiology) Valrie Hart, RN as Oncology Nurse Navigator  . 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 He underwent pacing 2016   He has a history of prior retinal artery occlusion and no source was ever identified. He is on aspirin and Plavix which he takes every other day.   He has intercurrently undergone surgery for lung cancer.  Partial pneumonectomy.  Complains of shortness of breath.  Also intercurrently hospitalized for acute diastolic heart failure following cardioversion.  He ended up off of his once he is now on dofetilide.  Holding sinus rhythm.  He is on diuretics now and struggles with being shackled to the commode.  He is copious in his fluid intake.  No real edema.  No nocturnal dyspnea orthopnea DATE TEST EF   4/16 Myoview    % No Ischemia  4/16 Echo   55-60 % LAE mild (42/3.2/24)   1/22 Echo  60-65%      Past Medical History:  Diagnosis Date  . Allergy   . Arthritis    "right knee and right thumb" (12/19/2014)  . Asthma    pt denies  . Bronchitis 07/2016   started in December and continued about 2 months  . Central retinal artery occlusion 06/26/2013  . CHF (congestive heart failure) ([place]) 08/24/2020  . Esophageal stricture   . GERD (gastroesophageal reflux disease)   . Hemochromatosis   . Hiatal hernia   . Hypercalcemia   . Hypercholesteremia   . Hypertension   . Internal hemorrhoids   . Lung cancer ([place])   . Osteoarthritis   . Palpitations   . Persistent atrial fibrillation ([place])   . Presence of permanent cardiac pacemaker    hx bradycardia  . Retinal artery occlusion, branch    "right eye"  . Second degree AV block, Mobitz type II   . Skin cancer    right shoulder  . Small cell lung cancer, right lower lobe ([place]) 03/12/2020  . Stroke [facility]) ~ 2011    "right eye"/ partial blindness  . Tubular adenoma of colon     Past Surgical History:  Procedure Laterality Date  . CARDIOVERSION N/A 07/29/2020   Procedure: CARDIOVERSION;  Surgeon: Jerline Pain, MD;  Location: [facility];  Service: Cardiovascular;  Laterality: N/A;  NEEDS RAPID COVID TEST MORNING OF  . CARDIOVERSION N/A 08/19/2020   Procedure: CARDIOVERSION;  Surgeon: Thayer Headings, MD;  Location: [place];  Service: Cardiovascular;  Laterality: N/A;  . CATARACT EXTRACTION, BILATERAL  01/2017  . COLONOSCOPY    . EP IMPLANTABLE DEVICE N/A 12/19/2014   Procedure: Pacemaker Implant;  Surgeon: Deboraha Sprang, MD;  Location: [facility] [facility];  Service: Cardiovascular;  Laterality: N/A;  . ESOPHAGOGASTRODUODENOSCOPY (EGD) WITH ESOPHAGEAL DILATION  X 3  . EYE SURGERY Bilateral 2017  . INGUINAL HERNIA REPAIR Left 1980's  . INSERT / REPLACE / REMOVE PACEMAKER  12/19/2014  . INTERCOSTAL NERVE BLOCK Right 03/11/2020   Procedure: INTERCOSTAL NERVE BLOCK;  Surgeon: Grace Isaac, MD;  Location: [place];  Service: Thoracic;  Laterality: Right;  . JOINT REPLACEMENT Right 2016  . KNEE ARTHROSCOPY Right ~ 1982; ~ 1992  . MOHS SURGERY Right ~ 2014   "pre-melanoma scapula"  . POSTERIOR TIBIAL TENDON REPAIR Left 2012  . TONSILLECTOMY  1946  .  TOTAL KNEE ARTHROPLASTY Right 05/20/2015   Procedure: RIGHT TOTAL KNEE ARTHROPLASTY;  Surgeon: Paralee Cancel, MD;  Location: [facility];  Service: Orthopedics;  Laterality: Right;  . UPPER GASTROINTESTINAL ENDOSCOPY    . VIDEO BRONCHOSCOPY N/A 03/11/2020   Procedure: VIDEO BRONCHOSCOPY;  Surgeon: Grace Isaac, MD;  Location: [facility];  Service: Thoracic;  Laterality: N/A;  . XI ROBOTIC ASSISTED THORACOSCOPY- SEGMENTECTOMY Right 03/11/2020   Procedure: XI ROBOTIC ASSISTED THORACOSCOPY-RIGHT SUPERIOR SEGMENTECTOMY WITH NODE SAMPLES;  Surgeon: Grace Isaac, MD;  Location: [facility];  Service: Thoracic;  Laterality: Right;    Current Outpatient Medications   Medication Sig Dispense Refill  . acetaminophen (TYLENOL) 500 MG tablet Take 500-1,000 mg by mouth every 6 (six) hours as needed (for pain).    Marland Kitchen atorvastatin (LIPITOR) 20 MG tablet Take 1 tablet (20 mg total) by mouth daily. 30 tablet 3  . celecoxib (CELEBREX) 200 MG capsule Take 1 capsule (200 mg total) by mouth 2 (two) times daily as needed for mild pain. 10 capsule 0  . dofetilide (TIKOSYN) 250 MCG capsule Take 1 capsule (250 mcg total) by mouth 2 (two) times daily. 60 capsule 6  . doxazosin (CARDURA) 4 MG tablet Take 4 mg by mouth daily.     Marland Kitchen esomeprazole (NEXIUM) 20 MG capsule Take 20 mg by mouth daily before breakfast.    . fluticasone (FLONASE) 50 MCG/ACT nasal spray Place 1 spray into both nostrils daily as needed for allergies or rhinitis.    . Glycerin-Hypromellose-PEG 400 (DRY EYE RELIEF DROPS) 0.2-0.2-1 % SOLN Place 1 drop into both eyes daily as needed (for dryness). Saline    . HYDROcodone-homatropine (HYCODAN) 5-1.5 MG/5ML syrup Take 5 mLs by mouth every 6 (six) hours as needed for cough.    . metoprolol succinate (TOPROL XL) 25 MG 24 hr tablet Take 0.5 tablets (12.5 mg total) by mouth at bedtime. 30 tablet 3  . montelukast (SINGULAIR) 10 MG tablet Take 10 mg by mouth every morning.     . Multiple Vitamins-Minerals (PRESERVISION AREDS 2 PO) Take 1 capsule by mouth in the morning and at bedtime.     . potassium chloride SA (KLOR-CON) 20 MEQ tablet Take 2 tablets (40 mEq total) by mouth 2 (two) times daily. Take an extra tablet (20 meq) anytime you take an extra torsemide 130 tablet 6  . rivaroxaban (XARELTO) 20 MG TABS tablet Take 1 tablet (20 mg total) by mouth daily with supper. 30 tablet 11  . torsemide (DEMADEX) 20 MG tablet Take 1 tablet (20 mg total) by mouth daily. Take extra 20 mg as needed for weight gain of 3 lbs overnight or 5 lbs within one week. 60 tablet 2  . vitamin C (ASCORBIC ACID) 500 MG tablet Take 500 mg by mouth at bedtime.      No current  facility-administered medications for this visit.    Allergies  Allergen Reactions  . Lisinopril Palpitations and Other (See Comments)    Dizziness, also    Review of Systems negative except from HPI and PMH  Physical Exam   BP 112/78   Pulse 79   Ht 5\' 10"  (1.778 m)   Wt 156 lb (70.8 kg)   SpO2 98%   BMI 22.38 kg/m  Well developed and well nourished in no acute distress HENT normal Neck supple with JVP-flat Clear Device pocket well healed; without hematoma or erythema.  There is no tethering  Regular rate and rhythm, no  gallop No murmur Abd-soft with active  BS No Clubbing cyanosis edema Skin-warm and dry A & Oriented  Grossly normal sensory and motor function  ECG AV pacing at 79    Assessment and  Plan  Sinus bradycardia  Hypertension   Prior stroke  Atrial fibrillation persistent  High Risk Medication Surveillance   Complete heart block  Pacemaker-Medtronid   Dyspnea on exertion   Patient's device function is normal.  There may be a hair of an adequate chronotropic response that could be augmented to help with his dyspnea on exertion.  We have empirically increased his slope on exertional rate from 3--4 and increase his peak sensor rate from 120--130.  He will let us know how he feels.  We also had a long discussion regarding management of his fluids in the context of his diuretics and his diastolic heart failure.  I encouraged him to decrease his fluid intake and as a consequence we will decrease his diuretic to 40 mg 3 times a week.  He is on a lot of potassium repletion so we will continue that at 20 mEq a day and 80 meq on the day he takes diuretics;  we will check Bmet again in 3 weeks.

## 2020-10-28 NOTE — Patient Instructions (Addendum)
Medication Instructions:  Your physician has recommended you make the following change in your medication:   ** Monday, Wednesday, Friday Take Demadex 40mg  (2 tablets by mouth)  **  Take 61meq (4 tablets by mouth) of Potassium on the days you take your diuretics and 70meq (1 tablet by mouth) on your diuretic off days  *If you need a refill on your cardiac medications before your next appointment, please call your pharmacy*   Lab Work: BMET in 3 weeks If you have labs (blood work) drawn today and your tests are completely normal, you will receive your results only by: Marland Kitchen MyChart Message (if you have MyChart) OR . A paper copy in the mail If you have any lab test that is abnormal or we need to change your treatment, we will call you to review the results.   Testing/Procedures: None ordered.    Follow-Up: At [facility], you and your health needs are our priority.  As part of our continuing mission to provide you with exceptional heart care, we have created designated Provider Care Teams.  These Care Teams include your primary Cardiologist (physician) and Advanced Practice Providers (APPs -  Physician Assistants and Nurse Practitioners) who all work together to provide you with the care you need, when you need it.  We recommend signing up for the patient portal called "MyChart".  Sign up information is provided on this After Visit Summary.  MyChart is used to connect with patients for Virtual Visits (Telemedicine).  Patients are able to view lab/test results, encounter notes, upcoming appointments, etc.  Non-urgent messages can be sent to your provider as well.   To learn more about what you can do with MyChart, go to [email or web address].    Your next appointment:   27months  The format for your next appointment:   In Person  Provider:   [name], MD

## 2020-10-31 ENCOUNTER — Ambulatory Visit (HOSPITAL_COMMUNITY)
Admission: RE | Admit: 2020-10-31 | Discharge: 2020-10-31 | Disposition: A | Discharge status: Home / Self Care | Payer: Medicare Other | Source: Ambulatory Visit | Attending: Internal Medicine | Admitting: Internal Medicine

## 2020-10-31 ENCOUNTER — Other Ambulatory Visit: Payer: Self-pay

## 2020-10-31 DIAGNOSIS — C3490 Malignant neoplasm of unspecified part of unspecified bronchus or lung: Secondary | ICD-10-CM

## 2020-10-31 DIAGNOSIS — I700 Atherosclerosis of aorta: Secondary | ICD-10-CM | POA: Insufficient documentation

## 2020-10-31 DIAGNOSIS — C3491 Malignant neoplasm of unspecified part of right bronchus or lung: Secondary | ICD-10-CM | POA: Insufficient documentation

## 2020-10-31 DIAGNOSIS — I311 Chronic constrictive pericarditis: Secondary | ICD-10-CM | POA: Diagnosis not present

## 2020-10-31 DIAGNOSIS — J90 Pleural effusion, not elsewhere classified: Secondary | ICD-10-CM | POA: Insufficient documentation

## 2020-10-31 LAB — GLUCOSE, CAPILLARY: Glucose-Capillary: 96 mg/dL (ref 70–99)

## 2020-10-31 IMAGING — PT NM PET TUM IMG RESTAG (PS) SKULL BASE T - THIGH
8 series · 25 of 25 positions shown · non-contrast
Comparison: Prior PET CT from [V7] and most recent chest CT [DATE] and intervening examinations available for
comparison.

CLINICAL DATA: Subsequent treatment strategy for 79-year-old male
with history of small cell lung cancer post partial lung resection
of the superior segment of the RIGHT lower lobe in [V7] post 4
cycles of systemic therapy including Neulasta. Recent chest CT
suspicious for disease worsening.

EXAM:
NUCLEAR MEDICINE PET SKULL BASE TO THIGH
TECHNIQUE: 7.8 mCi F-18 FDG was injected intravenously. Full-ring PET imaging
was performed from the skull base to thigh after the radiotracer. CT
data was obtained and used for attenuation correction and anatomic
localization.
Fasting blood glucose: 96 mg/dl

[Series 3: pet sk_thigh ac · axial · 5.0mm · 4.07mm/px · z∈[-1002,-46]mm · 6 of 240 slices shown]
[im 1/240]
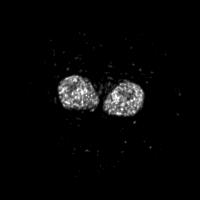
[im 48/240]
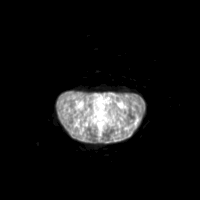
[im 96/240]
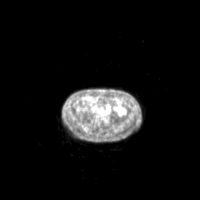
[im 144/240]
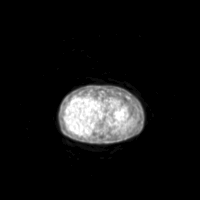
[im 192/240]
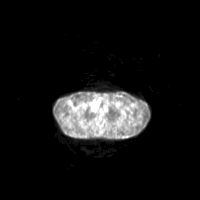
[im 240/240]
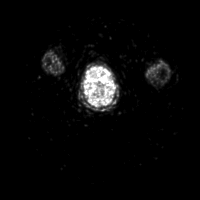

[Series 4: ct sk_thigh 5.0 bf37 · axial · 5.0mm · 0.98mm/px · z∈[-1002,-46]mm · 5 of 240 slices shown]
[im 1/240]
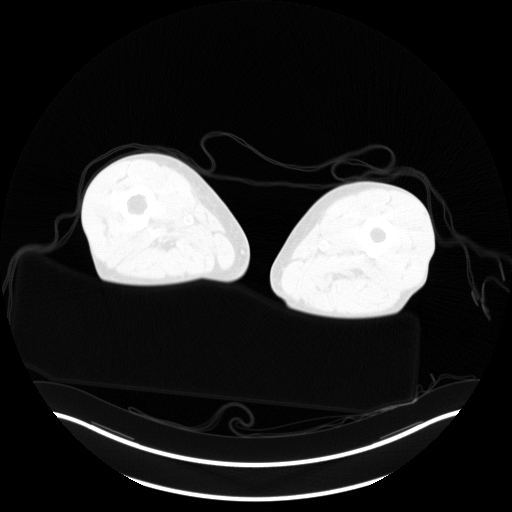
[im 60/240]
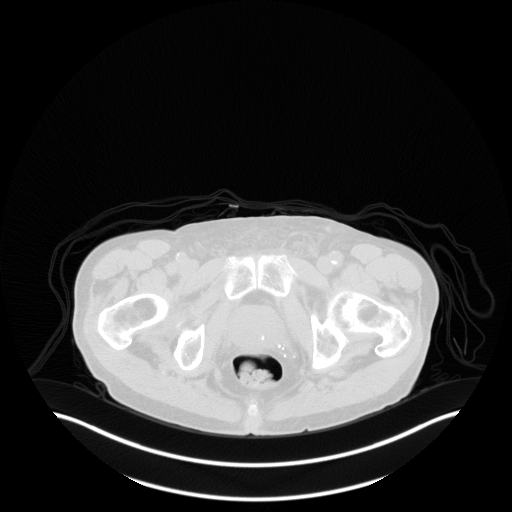
[im 120/240]
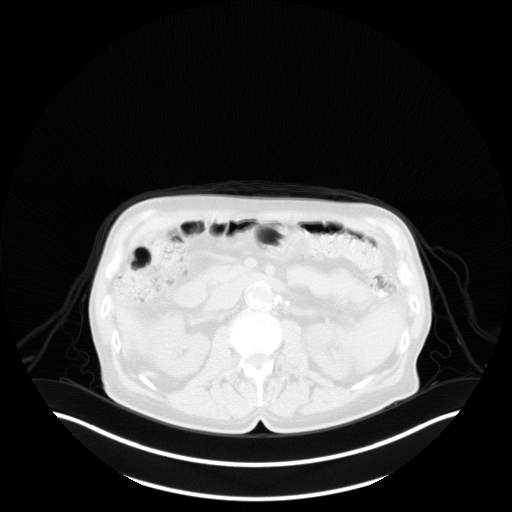
[im 180/240]
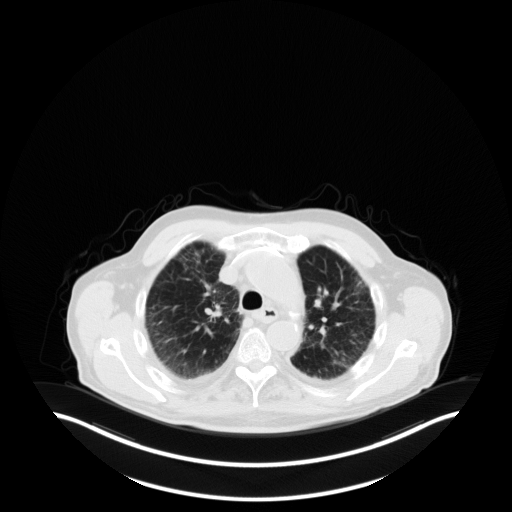
[im 240/240  brain]
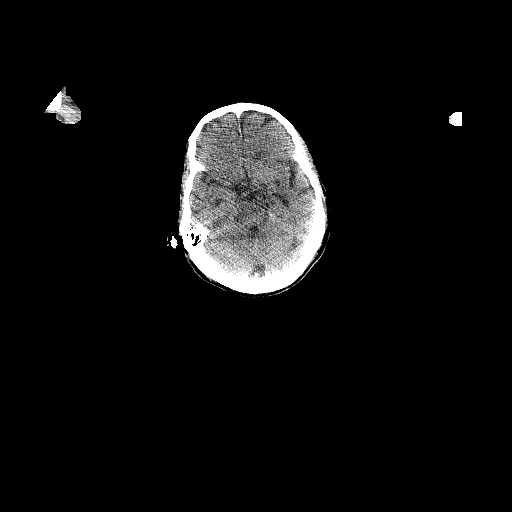

[Series 5: pet sk_thigh nac · axial · 5.0mm · 4.07mm/px · z∈[-1002,-46]mm · 5 of 240 slices shown]
[im 1/240]
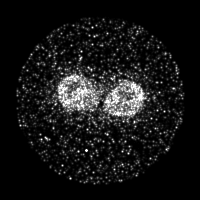
[im 60/240]
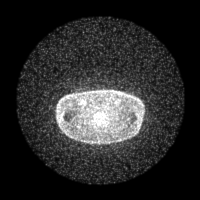
[im 120/240]
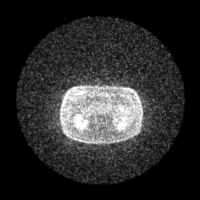
[im 180/240]
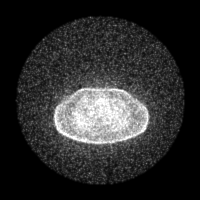
[im 240/240]
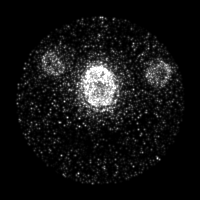

[Series 8: ct sk_thigh 5.0 br59 lung_bone · axial · 5.0mm · 0.69mm/px · 1 of 60 slices shown]
[im 1/60]
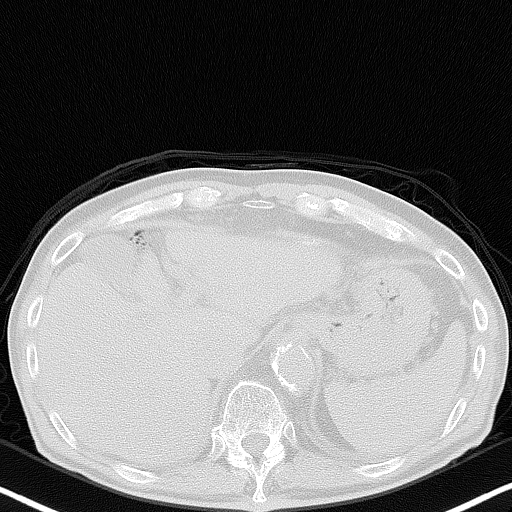

[Series 603: fused cor · 1 of 48 slices shown]
[im 1/48]
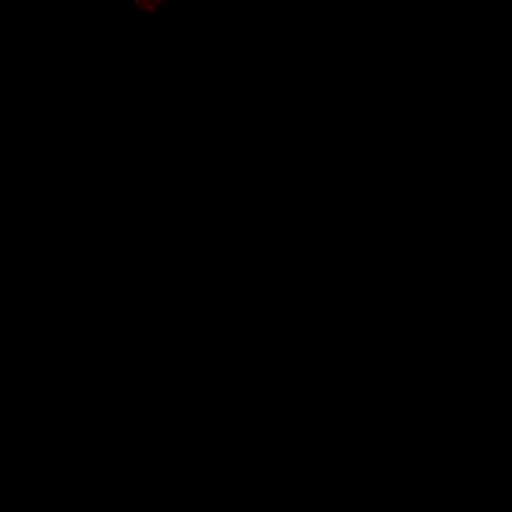

[Series 604: <mip collection> · coronal · 1.98mm/px · 1 of 32 slices shown]
[im 1/32]
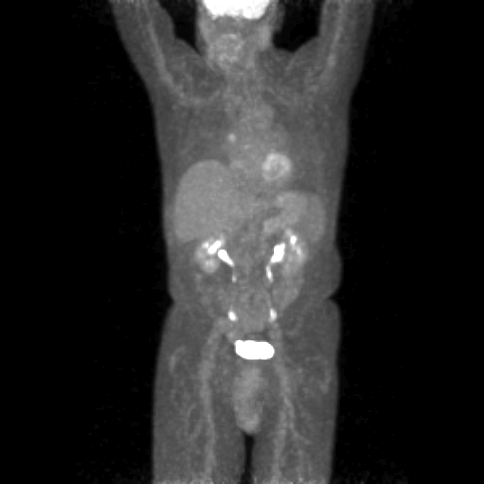

[Series 605: range-ct sk_thigh 5.0 bf37-tra-<alpha range> · 5 of 234 slices shown]
[im 1/234]
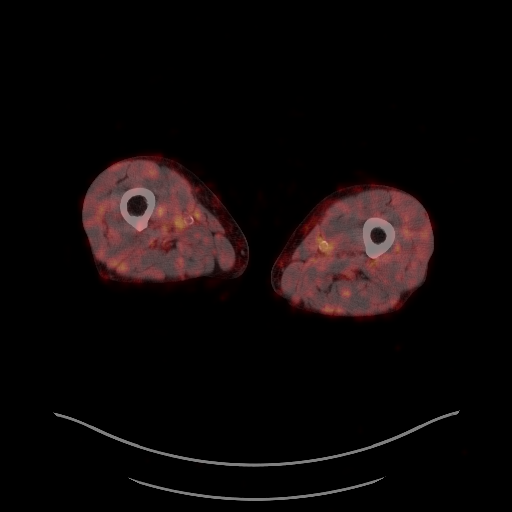
[im 59/234]
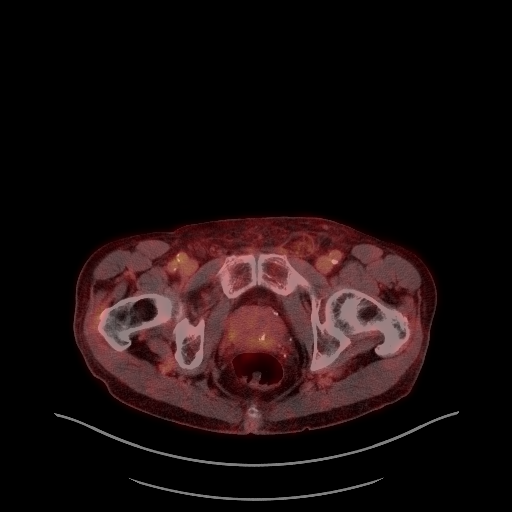
[im 117/234]
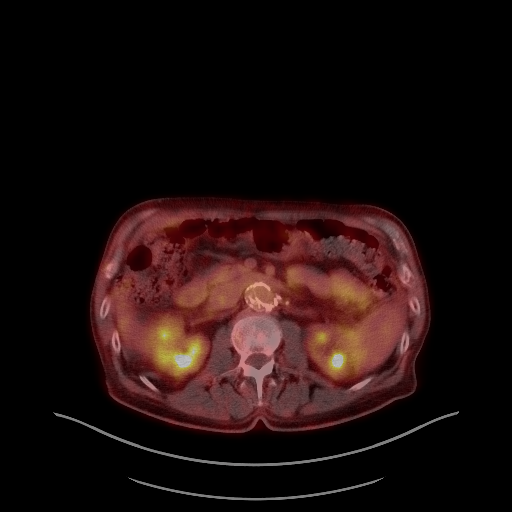
[im 175/234]
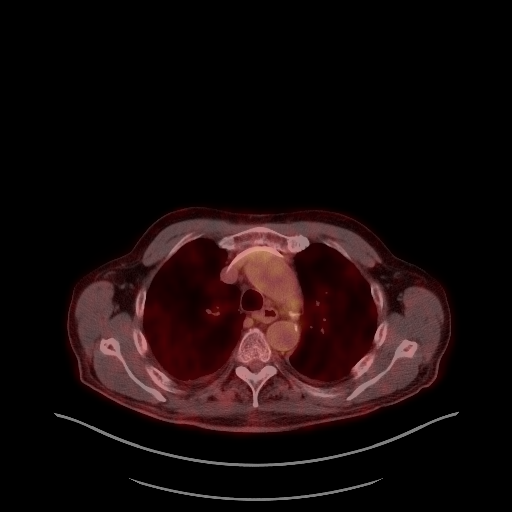
[im 234/234]
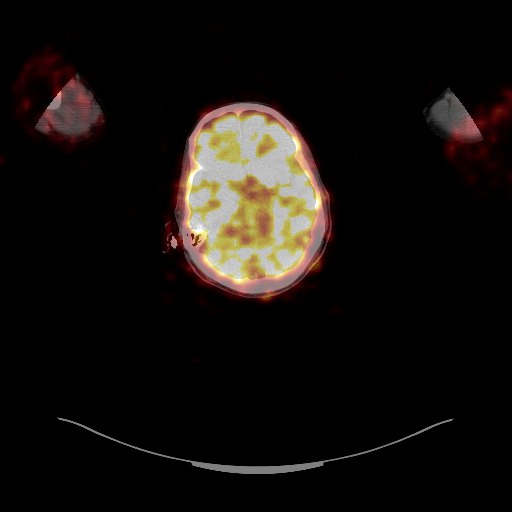

[Series 1243: results mm oncology reading · 4.0mm · 1.20mm/px · 1 of 5 slices shown]
[im 1/5]
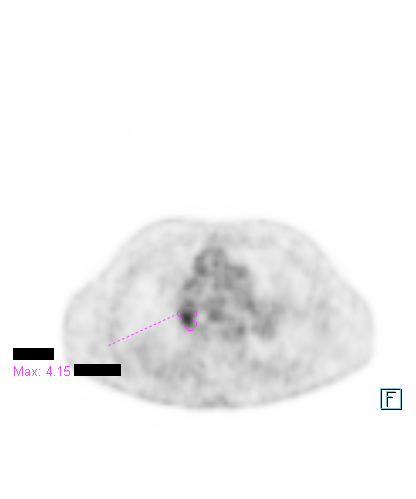

[25 of 25 positions shown; findings below may reference images not displayed]

FINDINGS: Mediastinal blood pool activity: SUV max

Liver activity: SUV max NA

NECK: No hypermetabolic lymph nodes in the neck.

Incidental CT findings: none

CHEST: Mild fullness of the RIGHT hilum with similar appearance to
recent chest CT showing a maximum SUV of 4.2. Subcarinal lymph nodes
and AP window lymph nodes/LEFT paratracheal lymph nodes with mild
prominence displaying metabolic activity only slightly greater than
blood pool activity at approximately 2.6. Subcarinal and LEFT
paratracheal lymph nodes are less than a cm and unchanged from
recent imaging. No suspicious pulmonary nodule, parenchymal
assessment is limited by respiratory motion in there are changes in
the RIGHT chest related to superior segment resection in the RIGHT
lower lobe.

Incidental CT findings: Calcified atheromatous plaque of the
thoracic aorta with stable aneurysmal dilation of the ascending
thoracic aorta approximately 4.2 cm. Peripheral pericardial
calcification is quite dense about the heart. No visible pericardial
effusion. Dual lead pacer device remains in place. Central pulmonary
vasculature stable caliber. Limited assessment of cardiovascular
structures given lack of intravenous contrast.

Signs of septal thickening in the chest appear worse than on
previous imaging, associated with ground-glass, slight increase in
bilateral pleural effusions layering dependently in the LEFT and
RIGHT chest compared to previous evaluation from [DATE].

Esophagus grossly normal.

ABDOMEN/PELVIS: No abnormal hypermetabolic activity within the
liver, pancreas, adrenal glands, or spleen. No hypermetabolic lymph
nodes in the abdomen or pelvis.

Incidental CT findings: Liver, gallbladder, spleen, pancreas and
adrenal glands without acute process. Cyst in the upper pole the
LEFT kidney as before. No hydronephrosis. Smooth contour of the
urinary bladder. No acute gastrointestinal process. Normal appendix.

Aortic atherosclerosis with 2.6 cm infrarenal abdominal aorta.  The

SKELETON: No focal hypermetabolic activity to suggest skeletal
metastasis.

Incidental CT findings: Thoracic spine compression fracture at the
T11 level with similar appearance, associated with kyphosis.
Degenerative changes in the cervical spine associated with mild FDG
uptake on the RIGHT
IMPRESSION: 1. RIGHT hilar lymph node mildly suspicious for involvement given
low level activity, greater than blood pool and background liver.
Reactive changes are also considered.
2. Other lymph nodes in the chest without significant metabolic
activity, close attention on follow-up is suggested.
3. Increased septal thickening, ground-glass and effusions,
correlate with any respiratory symptoms or evidence of heart failure
or volume overload. Effusions are slightly larger than the recent
chest CT but remains smaller than the study [DATE]
4. Pericardial calcifications as before, correlate with any signs of
restrictive physiology.
5. Calcified aortic atherosclerosis and aneurysmal disease as on
prior imaging, refer to previous reports recommendations.
6. Trace free fluid in the pelvis could be related to volume status.
No other signs to indicate acute findings in the abdomen or pelvis.

## 2020-10-31 MED ORDER — FLUDEOXYGLUCOSE F - 18 (FDG) INJECTION
7.7000 | Freq: Once | INTRAVENOUS | Status: AC | PRN
  Administered 2020-10-31: 7.8 via INTRAVENOUS

## 2020-11-05 DIAGNOSIS — E782 Mixed hyperlipidemia: Secondary | ICD-10-CM | POA: Diagnosis not present

## 2020-11-05 DIAGNOSIS — Z125 Encounter for screening for malignant neoplasm of prostate: Secondary | ICD-10-CM | POA: Diagnosis not present

## 2020-11-06 ENCOUNTER — Inpatient Hospital Stay: Payer: Medicare Other | Attending: Physician Assistant | Admitting: Internal Medicine

## 2020-11-06 ENCOUNTER — Other Ambulatory Visit: Payer: Self-pay

## 2020-11-06 ENCOUNTER — Inpatient Hospital Stay: Payer: Medicare Other

## 2020-11-06 ENCOUNTER — Encounter: Payer: Self-pay | Admitting: *Deleted

## 2020-11-06 DIAGNOSIS — K219 Gastro-esophageal reflux disease without esophagitis: Secondary | ICD-10-CM | POA: Diagnosis not present

## 2020-11-06 DIAGNOSIS — R5383 Other fatigue: Secondary | ICD-10-CM | POA: Insufficient documentation

## 2020-11-06 DIAGNOSIS — R748 Abnormal levels of other serum enzymes: Secondary | ICD-10-CM | POA: Diagnosis not present

## 2020-11-06 DIAGNOSIS — R590 Localized enlarged lymph nodes: Secondary | ICD-10-CM | POA: Diagnosis not present

## 2020-11-06 DIAGNOSIS — J90 Pleural effusion, not elsewhere classified: Secondary | ICD-10-CM | POA: Diagnosis not present

## 2020-11-06 DIAGNOSIS — Z79899 Other long term (current) drug therapy: Secondary | ICD-10-CM | POA: Insufficient documentation

## 2020-11-06 DIAGNOSIS — C3431 Malignant neoplasm of lower lobe, right bronchus or lung: Secondary | ICD-10-CM | POA: Diagnosis not present

## 2020-11-06 DIAGNOSIS — Z9221 Personal history of antineoplastic chemotherapy: Secondary | ICD-10-CM | POA: Diagnosis not present

## 2020-11-06 DIAGNOSIS — K449 Diaphragmatic hernia without obstruction or gangrene: Secondary | ICD-10-CM | POA: Diagnosis not present

## 2020-11-06 DIAGNOSIS — I509 Heart failure, unspecified: Secondary | ICD-10-CM | POA: Insufficient documentation

## 2020-11-06 DIAGNOSIS — Z7901 Long term (current) use of anticoagulants: Secondary | ICD-10-CM | POA: Insufficient documentation

## 2020-11-06 DIAGNOSIS — I700 Atherosclerosis of aorta: Secondary | ICD-10-CM | POA: Insufficient documentation

## 2020-11-06 DIAGNOSIS — R1011 Right upper quadrant pain: Secondary | ICD-10-CM | POA: Insufficient documentation

## 2020-11-06 DIAGNOSIS — M1990 Unspecified osteoarthritis, unspecified site: Secondary | ICD-10-CM | POA: Insufficient documentation

## 2020-11-06 DIAGNOSIS — C3490 Malignant neoplasm of unspecified part of unspecified bronchus or lung: Secondary | ICD-10-CM | POA: Diagnosis not present

## 2020-11-06 LAB — FERRITIN: Ferritin: 84 ng/mL (ref 24–336)

## 2020-11-06 LAB — IRON AND TIBC
Iron: 48 ug/dL (ref 42–163)
Saturation Ratios: 18 % — ABNORMAL LOW (ref 20–55)
TIBC: 266 ug/dL (ref 202–409)
UIBC: 219 ug/dL (ref 117–376)

## 2020-11-06 NOTE — Progress Notes (Signed)
Per Dr. Julien Nordmann, re-referred [name] to Dr. Roxan Hockey for bx evaluation.

## 2020-11-06 NOTE — Progress Notes (Signed)
[facility] Telephone:([phone]) [phone]   Fax:([phone]) [phone]  OFFICE PROGRESS NOTE  [name], MD [place] [address]  DIAGNOSIS: Stage IB (T2 a, N0, M0) small cell lung cancer presented with superior segment right lower lobe lung nodule.  PRIOR THERAPY:  1) Status post right lower lobe superior segmentectomy with lymph node sampling on March 12, 2020 under the care of Dr. Servando Snare. 2) Systemic chemotherapy with carboplatin for AUC of 5 on day 1 and etoposide 100 mg/M2 on days 1, 2 and 3 with Neulasta support every 3 weeks.  Status post 4 cycles.  CURRENT THERAPY: Observation.  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 male returns to the clinic today for follow-up visit.  The patient is feeling fine today with no concerning complaints except for mild fatigue.  He denied having any current chest pain but has some shortness of breath with exertion with mild cough and no hemoptysis.  He denied having any fever or chills.  He has no nausea, vomiting, diarrhea or constipation.  He denied having any headache or visual changes.  He had a PET scan performed recently and is here for evaluation and discussion of his PET scan results and recommendation regarding his condition.   MEDICAL HISTORY: Past Medical History:  Diagnosis Date  . Allergy   . Arthritis    "right knee and right thumb" (12/19/2014)  . Asthma    pt denies  . Bronchitis 07/2016   started in December and continued about 2 months  . Central retinal artery occlusion 06/26/2013  . CHF (congestive heart failure) ([facility]) 08/24/2020  . Esophageal stricture   . GERD (gastroesophageal reflux disease)   . Hemochromatosis   . Hiatal hernia   . Hypercalcemia   . Hypercholesteremia   . Hypertension   . Internal hemorrhoids   . Lung cancer ([facility])   . Osteoarthritis   . Palpitations   . Persistent atrial fibrillation ([facility])   . Presence of permanent cardiac pacemaker    hx bradycardia   . Retinal artery occlusion, branch    "right eye"  . Second degree AV block, Mobitz type II   . Skin cancer    right shoulder  . Small cell lung cancer, right lower lobe ([facility]) 03/12/2020  . Stroke [facility]) ~ 2011   "right eye"/ partial blindness  . Tubular adenoma of colon     ALLERGIES:  is allergic to lisinopril.  MEDICATIONS:  Current Outpatient Medications  Medication Sig Dispense Refill  . acetaminophen (TYLENOL) 500 MG tablet Take 500-1,000 mg by mouth every 6 (six) hours as needed (for pain).    Marland Kitchen atorvastatin (LIPITOR) 20 MG tablet Take 1 tablet (20 mg total) by mouth daily. 30 tablet 3  . celecoxib (CELEBREX) 200 MG capsule Take 1 capsule (200 mg total) by mouth 2 (two) times daily as needed for mild pain. 10 capsule 0  . dofetilide (TIKOSYN) 250 MCG capsule Take 1 capsule (250 mcg total) by mouth 2 (two) times daily. 60 capsule 6  . doxazosin (CARDURA) 4 MG tablet Take 4 mg by mouth daily.     Marland Kitchen esomeprazole (NEXIUM) 20 MG capsule Take 20 mg by mouth daily before breakfast.    . fluticasone (FLONASE) 50 MCG/ACT nasal spray Place 1 spray into both nostrils daily as needed for allergies or rhinitis.    . Glycerin-Hypromellose-PEG 400 (DRY EYE RELIEF DROPS) 0.2-0.2-1 % SOLN Place 1 drop into both eyes daily as needed (for dryness).  Saline    . HYDROcodone-homatropine (HYCODAN) 5-1.5 MG/5ML syrup Take 5 mLs by mouth every 6 (six) hours as needed for cough.    . metoprolol succinate (TOPROL-XL) 25 MG 24 hr tablet TAKE 1/2 TABLET (12.5 MG TOTAL) BY MOUTH AT BEDTIME. 30 tablet 3  . montelukast (SINGULAIR) 10 MG tablet Take 10 mg by mouth every morning.     . Multiple Vitamins-Minerals (PRESERVISION AREDS 2 PO) Take 1 capsule by mouth in the morning and at bedtime.     . potassium chloride SA (KLOR-CON) 20 MEQ tablet Take 68meq (4 tablets by mouth) on Monday, Wednesday and Friday - the days you take your diuretic, Torsemide.  Take 43meq (1 tablet by mouth) on the days you do not take your  diuretic. 90 tablet 3  . rivaroxaban (XARELTO) 20 MG TABS tablet Take 1 tablet (20 mg total) by mouth daily with supper. 30 tablet 11  . torsemide (DEMADEX) 20 MG tablet Monday, Wednesday and Friday you will take 40mg  ( 2 tablets by mouth) 60 tablet 2  . vitamin C (ASCORBIC ACID) 500 MG tablet Take 500 mg by mouth at bedtime.      No current facility-administered medications for this visit.    SURGICAL HISTORY:  Past Surgical History:  Procedure Laterality Date  . CARDIOVERSION N/A 07/29/2020   Procedure: CARDIOVERSION;  Surgeon: Jerline Pain, MD;  Location: [facility];  Service: Cardiovascular;  Laterality: N/A;  NEEDS RAPID COVID TEST MORNING OF  . CARDIOVERSION N/A 08/19/2020   Procedure: CARDIOVERSION;  Surgeon: Thayer Headings, MD;  Location: [facility];  Service: Cardiovascular;  Laterality: N/A;  . CATARACT EXTRACTION, BILATERAL  01/2017  . COLONOSCOPY    . EP IMPLANTABLE DEVICE N/A 12/19/2014   Procedure: Pacemaker Implant;  Surgeon: Deboraha Sprang, MD;  Location: [facility];  Service: Cardiovascular;  Laterality: N/A;  . ESOPHAGOGASTRODUODENOSCOPY (EGD) WITH ESOPHAGEAL DILATION  X 3  . EYE SURGERY Bilateral 2017  . INGUINAL HERNIA REPAIR Left 1980's  . INSERT / REPLACE / REMOVE PACEMAKER  12/19/2014  . INTERCOSTAL NERVE BLOCK Right 03/11/2020   Procedure: INTERCOSTAL NERVE BLOCK;  Surgeon: Grace Isaac, MD;  Location: [facility];  Service: Thoracic;  Laterality: Right;  . JOINT REPLACEMENT Right 2016  . KNEE ARTHROSCOPY Right ~ 1982; ~ 1992  . MOHS SURGERY Right ~ 2014   "pre-melanoma scapula"  . POSTERIOR TIBIAL TENDON REPAIR Left 2012  . TONSILLECTOMY  1946  . TOTAL KNEE ARTHROPLASTY Right 05/20/2015   Procedure: RIGHT TOTAL KNEE ARTHROPLASTY;  Surgeon: Paralee Cancel, MD;  Location: [facility];  Service: Orthopedics;  Laterality: Right;  . UPPER GASTROINTESTINAL ENDOSCOPY    . VIDEO BRONCHOSCOPY N/A 03/11/2020   Procedure: VIDEO BRONCHOSCOPY;  Surgeon: Grace Isaac, MD;  Location: [facility];  Service: Thoracic;  Laterality: N/A;  . XI ROBOTIC ASSISTED THORACOSCOPY- SEGMENTECTOMY Right 03/11/2020   Procedure: XI ROBOTIC ASSISTED THORACOSCOPY-RIGHT SUPERIOR SEGMENTECTOMY WITH NODE SAMPLES;  Surgeon: Grace Isaac, MD;  Location: [facility];  Service: Thoracic;  Laterality: Right;    REVIEW OF SYSTEMS:  Constitutional: positive for fatigue Eyes: negative Ears, nose, mouth, throat, and face: negative Respiratory: positive for dyspnea on exertion Cardiovascular: negative Gastrointestinal: negative Genitourinary:negative Integument/breast: negative Hematologic/lymphatic: negative Musculoskeletal:negative Neurological: negative Behavioral/Psych: negative Endocrine: negative Allergic/Immunologic: negative   PHYSICAL EXAMINATION: General appearance: alert, cooperative, fatigued and no distress Head: Normocephalic, without obvious abnormality, atraumatic Neck: no adenopathy, no JVD, supple, symmetrical, trachea midline and thyroid not enlarged, symmetric, no tenderness/mass/nodules  Lymph nodes: Cervical, supraclavicular, and axillary nodes normal. Resp: clear to auscultation bilaterally Back: symmetric, no curvature. ROM normal. No CVA tenderness. Cardio: regular rate and rhythm, S1, S2 normal, no murmur, click, rub or gallop GI: soft, non-tender; bowel sounds normal; no masses,  no organomegaly Extremities: extremities normal, atraumatic, no cyanosis or edema Neurologic: Alert and oriented X 3, normal strength and tone. Normal symmetric reflexes. Normal coordination and gait  ECOG PERFORMANCE STATUS: 1 - Symptomatic but completely ambulatory  Blood pressure 104/70, pulse 81, temperature 97.7 F (36.5 C), temperature source Tympanic, resp. rate 20, height 5\' 10"  (1.778 m), weight 159 lb 8 oz (72.3 kg), SpO2 92 %.  LABORATORY DATA: Lab Results  Component Value Date   WBC 5.6 10/14/2020   HGB 12.3 (L) 10/14/2020   HCT 37.9 (L) 10/14/2020   MCV  89.4 10/14/2020   PLT 145 (L) 10/14/2020      Chemistry      Component Value Date/Time   NA 139 10/14/2020 0958   NA 142 10/03/2020 1340   K 4.2 10/14/2020 0958   CL 105 10/14/2020 0958   CO2 28 10/14/2020 0958   BUN 19 10/14/2020 0958   BUN 18 10/03/2020 1340   CREATININE 1.09 10/14/2020 0958      Component Value Date/Time   CALCIUM 10.5 (H) 10/14/2020 0958   ALKPHOS 399 (H) 10/14/2020 0958   AST 26 10/14/2020 0958   ALT 20 10/14/2020 0958   BILITOT 1.9 (H) 10/14/2020 0958       RADIOGRAPHIC STUDIES: CT Chest W Contrast  Result Date: 10/14/2020 CLINICAL DATA:  Small-cell lung cancer.  Staging. EXAM: CT CHEST WITH CONTRAST TECHNIQUE: Multidetector CT imaging of the chest was performed during intravenous contrast administration. CONTRAST:  43mL OMNIPAQUE IOHEXOL 300 MG/ML  SOLN COMPARISON:  CTA chest 06/26/2020 FINDINGS: Cardiovascular: The heart size is normal. No substantial pericardial effusion. Coronary artery calcification is evident. Marked pericardial calcification again noted. Ascending thoracic aorta measures 4.1 cm diameter. Left permanent pacemaker noted. Mediastinum/Nodes: No left hilar lymphadenopathy. Right hilum shows progressive lymph nodes measuring up to 13 mm short axis (67/2). 9 mm short axis subcarinal node measured previously is 13 mm today (70/2). The esophagus has normal imaging features. There is no axillary lymphadenopathy. Lungs/Pleura: The small bilateral pleural effusions seen previously have decreased in the interval. Subpleural reticulation in the lungs bilaterally again noted suggesting underlying component of pulmonary fibrosis. Staple line again noted posterior right lower lobe no suspicious pulmonary nodule or mass. Upper Abdomen: Small cyst again noted upper pole left kidney. Calcified thrombus versus chronic dissection of the juxtarenal abdominal aorta again noted, incompletely visualized. Musculoskeletal: No worrisome lytic or sclerotic osseous  abnormality. Compression deformity at T7 and T11 is stable in the interval. IMPRESSION: 1. Interval decrease in size of the small bilateral pleural effusions seen previously. 2. Interval progression of right hilar and subcarinal lymph nodes, now mildly enlarged. These may be reactive, but follow-up recommended to ensure stability. Consider repeat CT chest in 3 months. Stable compression fractures at T7 and T11. 3. Pericardial calcification, as before. 4. 4.1 cm ascending thoracic aorta. Recommend annual imaging followup by CTA or MRA. This recommendation follows 2010 ACCF/AHA/AATS/ACR/ASA/SCA/SCAI/SIR/STS/SVM Guidelines for the Diagnosis and Management of Patients with Thoracic Aortic Disease. Circulation. 2010; 121: O878-M767. Aortic aneurysm NOS (ICD10-I71.9) 5. Aortic Atherosclerosis (ICD10-I70.0). Electronically Signed   By: Misty Stanley M.D.   On: 10/14/2020 14:09   NM PET Image Restage (PS) Skull Base to Thigh  Result Date: 11/01/2020 CLINICAL  DATA:  Subsequent treatment strategy for 80 year old male with history of small cell lung cancer post partial lung resection of the superior segment of the RIGHT lower lobe in 2021 post 4 cycles of systemic therapy including Neulasta. Recent chest CT suspicious for disease worsening. EXAM: NUCLEAR MEDICINE PET SKULL BASE TO THIGH TECHNIQUE: 7.8 mCi F-18 FDG was injected intravenously. Full-ring PET imaging was performed from the skull base to thigh after the radiotracer. CT data was obtained and used for attenuation correction and anatomic localization. Fasting blood glucose: 96 mg/dl COMPARISON:  Prior PET CT from 2021 and most recent chest CT of October 14, 2020 and intervening examinations available for comparison. FINDINGS: Mediastinal blood pool activity: SUV max 2.02 Liver activity: SUV max NA NECK: No hypermetabolic lymph nodes in the neck. Incidental CT findings: none CHEST: Mild fullness of the RIGHT hilum with similar appearance to recent chest CT showing a  maximum SUV of 4.2. Subcarinal lymph nodes and AP window lymph nodes/LEFT paratracheal lymph nodes with mild prominence displaying metabolic activity only slightly greater than blood pool activity at approximately 2.6. Subcarinal and LEFT paratracheal lymph nodes are less than a cm and unchanged from recent imaging. No suspicious pulmonary nodule, parenchymal assessment is limited by respiratory motion in there are changes in the RIGHT chest related to superior segment resection in the RIGHT lower lobe. Incidental CT findings: Calcified atheromatous plaque of the thoracic aorta with stable aneurysmal dilation of the ascending thoracic aorta approximately 4.2 cm. Peripheral pericardial calcification is quite dense about the heart. No visible pericardial effusion. Dual lead pacer device remains in place. Central pulmonary vasculature stable caliber. Limited assessment of cardiovascular structures given lack of intravenous contrast. Signs of septal thickening in the chest appear worse than on previous imaging, associated with ground-glass, slight increase in bilateral pleural effusions layering dependently in the LEFT and RIGHT chest compared to previous evaluation from October 14, 2020. Esophagus grossly normal. ABDOMEN/PELVIS: No abnormal hypermetabolic activity within the liver, pancreas, adrenal glands, or spleen. No hypermetabolic lymph nodes in the abdomen or pelvis. Incidental CT findings: Liver, gallbladder, spleen, pancreas and adrenal glands without acute process. Cyst in the upper pole the LEFT kidney as before. No hydronephrosis. Smooth contour of the urinary bladder. No acute gastrointestinal process. Normal appendix. Aortic atherosclerosis with 2.6 cm infrarenal abdominal aorta.  The SKELETON: No focal hypermetabolic activity to suggest skeletal metastasis. Incidental CT findings: Thoracic spine compression fracture at the T11 level with similar appearance, associated with kyphosis. Degenerative changes in  the cervical spine associated with mild FDG uptake on the RIGHT IMPRESSION: 1. RIGHT hilar lymph node mildly suspicious for involvement given low level activity, greater than blood pool and background liver. Reactive changes are also considered. 2. Other lymph nodes in the chest without significant metabolic activity, close attention on follow-up is suggested. 3. Increased septal thickening, ground-glass and effusions, correlate with any respiratory symptoms or evidence of heart failure or volume overload. Effusions are slightly larger than the recent chest CT but remains smaller than the study of November of 2021 4. Pericardial calcifications as before, correlate with any signs of restrictive physiology. 5. Calcified aortic atherosclerosis and aneurysmal disease as on prior imaging, refer to previous reports recommendations. 6. Trace free fluid in the pelvis could be related to volume status. No other signs to indicate acute findings in the abdomen or pelvis. Electronically Signed   By: Zetta Bills M.D.   On: 11/01/2020 10:32    ASSESSMENT AND PLAN: This is a very pleasant  80 years old white male diagnosed with a stage Ib (T2 a, N0, M0) small cell lung cancer presented with superior segment right lower lobe nodule status post right lower lobe superior segmentectomy with lymph node dissection in August 2021 under the care of Dr. Servando Snare. The patient completed a course of adjuvant treatment with systemic chemotherapy with carboplatin for AUC of 5 on day 1 and etoposide 100 mg/M2 on days 1, 2 and 3 with Neulasta support.  He status post 4 cycles. The patient is currently on observation and he is feeling fine except for fatigue and intermittent right upper quadrant abdominal pain. His scan showed interval decrease in the size of the small bilateral pleural effusions that was seen previously but there was interval progression of right hilar and subcarinal lymph nodes mildly enlarged with.  There was a stable  compression fracture of T7 and T11. The patient had a PET scan performed recently.  I personally and independently reviewed the scan images and discussed the result and showed the images to the patient today.  His PET scan showed no concerning evidence for metastasis except for hypermetabolic right hilar lymph node that still suspicious for malignancy. I recommended for the patient to see Dr. Roxan Hockey for consideration of bronchoscopy and biopsy of this hilar soft tissue lesion.  If the final pathology is positive for recurrent malignancy, the patient may benefit from radiotherapy to this area. I will see him back for follow-up visit in 3 months for evaluation with repeat CT scan of the chest for restaging of his disease. For the history of hemochromatosis, I will check his iron study and ferritin today and consider the patient for phlebotomy if needed. He was advised to call immediately if he has any other concerning symptoms in the interval. The patient voices understanding of current disease status and treatment options and is in agreement with the current care plan.  All questions were answered. The patient knows to call the clinic with any problems, questions or concerns. We can certainly see the patient much sooner if necessary.  Disclaimer: This note was dictated with voice recognition software. Similar sounding words can inadvertently be transcribed and may not be corrected upon review.

## 2020-11-11 ENCOUNTER — Other Ambulatory Visit: Payer: Self-pay | Admitting: Medical Oncology

## 2020-11-11 ENCOUNTER — Other Ambulatory Visit: Payer: Self-pay

## 2020-11-11 ENCOUNTER — Inpatient Hospital Stay: Payer: Medicare Other

## 2020-11-11 ENCOUNTER — Telehealth: Payer: Self-pay | Admitting: Internal Medicine

## 2020-11-11 ENCOUNTER — Telehealth: Payer: Self-pay | Admitting: Medical Oncology

## 2020-11-11 DIAGNOSIS — J90 Pleural effusion, not elsewhere classified: Secondary | ICD-10-CM | POA: Diagnosis not present

## 2020-11-11 DIAGNOSIS — Z79899 Other long term (current) drug therapy: Secondary | ICD-10-CM | POA: Diagnosis not present

## 2020-11-11 DIAGNOSIS — R590 Localized enlarged lymph nodes: Secondary | ICD-10-CM | POA: Diagnosis not present

## 2020-11-11 DIAGNOSIS — C3431 Malignant neoplasm of lower lobe, right bronchus or lung: Secondary | ICD-10-CM | POA: Diagnosis not present

## 2020-11-11 DIAGNOSIS — R5383 Other fatigue: Secondary | ICD-10-CM | POA: Diagnosis not present

## 2020-11-11 DIAGNOSIS — R1011 Right upper quadrant pain: Secondary | ICD-10-CM | POA: Diagnosis not present

## 2020-11-11 NOTE — Progress Notes (Signed)
Phlebotomy to R AC, 16g, pt tolerated well. 511g removed. Beverage provided.

## 2020-11-11 NOTE — Telephone Encounter (Signed)
Scheduled follow-up appointment per 4/11 schedule message. Patient is aware.

## 2020-11-11 NOTE — Patient Instructions (Signed)

## 2020-11-11 NOTE — Telephone Encounter (Signed)
"  Alexander Bears, MD  [place] [facility] Please let him know that he will need phlebotomy to keep his ferritin level less than 50. He can come and do it at the cancer center or do it at the [facility]. Thank you. "   11/11/20-Pt notified. Schedule message sent. Pt said if there is no availability this week ,he will go to [facility].

## 2020-11-12 ENCOUNTER — Encounter: Payer: Self-pay | Admitting: Internal Medicine

## 2020-11-12 DIAGNOSIS — Z1331 Encounter for screening for depression: Secondary | ICD-10-CM | POA: Diagnosis not present

## 2020-11-12 DIAGNOSIS — C3431 Malignant neoplasm of lower lobe, right bronchus or lung: Secondary | ICD-10-CM | POA: Diagnosis not present

## 2020-11-12 DIAGNOSIS — I10 Essential (primary) hypertension: Secondary | ICD-10-CM | POA: Diagnosis not present

## 2020-11-12 DIAGNOSIS — Z0000 Encounter for general adult medical examination without abnormal findings: Secondary | ICD-10-CM | POA: Diagnosis not present

## 2020-11-12 DIAGNOSIS — E782 Mixed hyperlipidemia: Secondary | ICD-10-CM | POA: Diagnosis not present

## 2020-11-12 DIAGNOSIS — Z1339 Encounter for screening examination for other mental health and behavioral disorders: Secondary | ICD-10-CM | POA: Diagnosis not present

## 2020-11-12 DIAGNOSIS — I480 Paroxysmal atrial fibrillation: Secondary | ICD-10-CM | POA: Diagnosis not present

## 2020-11-12 DIAGNOSIS — Z950 Presence of cardiac pacemaker: Secondary | ICD-10-CM | POA: Diagnosis not present

## 2020-11-12 DIAGNOSIS — H9193 Unspecified hearing loss, bilateral: Secondary | ICD-10-CM | POA: Diagnosis not present

## 2020-11-12 DIAGNOSIS — R82998 Other abnormal findings in urine: Secondary | ICD-10-CM | POA: Diagnosis not present

## 2020-11-12 NOTE — Telephone Encounter (Signed)
Dr Caryl Comes has spoken with pt and pt will have labs drawn at PCP office as well as BMET that was requested by Dr Caryl Comes d/t Spironolactone.

## 2020-11-13 ENCOUNTER — Encounter: Payer: Self-pay | Admitting: Thoracic Surgery (Cardiothoracic Vascular Surgery)

## 2020-11-13 ENCOUNTER — Institutional Professional Consult (permissible substitution) (INDEPENDENT_AMBULATORY_CARE_PROVIDER_SITE_OTHER): Payer: Medicare Other | Admitting: Thoracic Surgery (Cardiothoracic Vascular Surgery)

## 2020-11-13 ENCOUNTER — Other Ambulatory Visit: Payer: Self-pay | Admitting: *Deleted

## 2020-11-13 ENCOUNTER — Other Ambulatory Visit: Payer: Self-pay

## 2020-11-13 ENCOUNTER — Encounter: Payer: Self-pay | Admitting: *Deleted

## 2020-11-13 VITALS — BP 120/77 | HR 72 | Resp 20 | Ht 70.0 in | Wt 160.0 lb

## 2020-11-13 DIAGNOSIS — C3431 Malignant neoplasm of lower lobe, right bronchus or lung: Secondary | ICD-10-CM | POA: Diagnosis not present

## 2020-11-13 DIAGNOSIS — R590 Localized enlarged lymph nodes: Secondary | ICD-10-CM

## 2020-11-13 NOTE — H&P (View-Only) (Signed)
PCP is [name], MD Referring Provider is Alexander Bears, MD  Chief Complaint  Patient presents with  . Follow-up    Hx of segmentectomy 03/2020, CT chest 3/14, PET 3/31    HPI: Dr. Wonda Murray sent for consultation regarding right hilar adenopathy.  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   He underwent a right lower lobe superior segmentectomy in August 2021 by Dr. Servando Murray.  Pathology showed a T2, N0, stage Ib small cell carcinoma.  He then underwent chemotherapy with 4 cycles of carboplatin and etoposide.  He recently had a follow-up visit with Dr. Julien Murray and a PET/CT showed some metabolic activity in a right hilar lymph node.  There were no other sites of disease.  Zubrod Score: At the time of surgery this patient's most appropriate activity status/level should be described as: []     0    Normal activity, no symptoms [x]     1    Restricted in physical strenuous activity but ambulatory, able to do out light work []     2    Ambulatory and capable of self care, unable to do work activities, up and about >50 % of waking hours                              []     3    Only limited self care, in bed greater than 50% of waking hours []     4    Completely disabled, no self care, confined to bed or chair []     5    Moribund  Past Medical History:  Diagnosis Date  . Allergy   . Arthritis    "right knee and right thumb" (12/19/2014)  . Asthma    pt denies  . Bronchitis 07/2016   started in December and continued about 2 months  . Central retinal artery occlusion 06/26/2013  . CHF (congestive heart failure) ([place]) 08/24/2020  . Esophageal stricture   . GERD (gastroesophageal reflux disease)   .  Hemochromatosis   . Hiatal hernia   . Hypercalcemia   . Hypercholesteremia   . Hypertension   . Internal hemorrhoids   . Lung cancer ([place])   . Osteoarthritis   . Palpitations   . Persistent atrial fibrillation ([place])   . Presence of permanent cardiac pacemaker    hx bradycardia  . Retinal artery occlusion, branch    "right eye"  . Second degree AV block, Mobitz type II   . Skin cancer    right shoulder  . Small cell lung cancer, right lower lobe ([place]) 03/12/2020  . Stroke [facility]) ~ 2011   "right eye"/ partial blindness  . Tubular adenoma of colon     Past Surgical History:  Procedure Laterality Date  . CARDIOVERSION N/A 07/29/2020   Procedure: CARDIOVERSION;  Surgeon: Alexander Pain, MD;  Location: [facility];  Service: Cardiovascular;  Laterality: N/A;  NEEDS RAPID COVID TEST MORNING OF  . CARDIOVERSION N/A 08/19/2020   Procedure: CARDIOVERSION;  Surgeon: Alexander Headings, MD;  Location: [facility];  Service: Cardiovascular;  Laterality: N/A;  . CATARACT EXTRACTION, BILATERAL  01/2017  . COLONOSCOPY    . EP IMPLANTABLE DEVICE N/A 12/19/2014   Procedure: Pacemaker Implant;  Surgeon: Alexander Standard  Caryl Comes, MD;  Location: [facility];  Service: Cardiovascular;  Laterality: N/A;  . ESOPHAGOGASTRODUODENOSCOPY (EGD) WITH ESOPHAGEAL DILATION  X 3  . EYE SURGERY Bilateral 2017  . INGUINAL HERNIA REPAIR Left 1980's  . INSERT / REPLACE / REMOVE PACEMAKER  12/19/2014  . INTERCOSTAL NERVE BLOCK Right 03/11/2020   Procedure: INTERCOSTAL NERVE BLOCK;  Surgeon: Alexander Isaac, MD;  Location: [facility];  Service: Thoracic;  Laterality: Right;  . JOINT REPLACEMENT Right 2016  . KNEE ARTHROSCOPY Right ~ 1982; ~ 1992  . MOHS SURGERY Right ~ 2014   "pre-melanoma scapula"  . POSTERIOR TIBIAL TENDON REPAIR Left 2012  . TONSILLECTOMY  1946  . TOTAL KNEE ARTHROPLASTY Right 05/20/2015   Procedure: RIGHT TOTAL KNEE ARTHROPLASTY;  Surgeon: Alexander Cancel, MD;  Location: [facility];  Service: Orthopedics;   Laterality: Right;  . UPPER GASTROINTESTINAL ENDOSCOPY    . VIDEO BRONCHOSCOPY N/A 03/11/2020   Procedure: VIDEO BRONCHOSCOPY;  Surgeon: Alexander Isaac, MD;  Location: [facility] OR;  Service: Thoracic;  Laterality: N/A;  . XI ROBOTIC ASSISTED THORACOSCOPY- SEGMENTECTOMY Right 03/11/2020   Procedure: XI ROBOTIC ASSISTED THORACOSCOPY-RIGHT SUPERIOR SEGMENTECTOMY WITH NODE SAMPLES;  Surgeon: Alexander Isaac, MD;  Location: [facility] OR;  Service: Thoracic;  Laterality: Right;    Family History  Problem Relation Age of Onset  . Colon cancer Mother        dx in her 54s  . Heart disease Mother   . Alzheimer's disease Mother   . Colon cancer Father        dx in his early 96s  . Heart disease Father   . Prostate cancer Father   . Prostate cancer Brother        1/2 brother  . Hemochromatosis Brother   . Prostate cancer Brother   . Esophageal cancer Neg Hx   . Pancreatic cancer Neg Hx   . Stomach cancer Neg Hx   . Liver disease Neg Hx     Social History Social History   Tobacco Use  . Smoking status: Former Smoker    Years: 3.00    Types: Cigarettes    Quit date: 05/16/1962    Years since quitting: 58.5  . Smokeless tobacco: Never Used  . Tobacco comment: "quit smoking in the 1960's"; was an intermittent smoker   Vaping Use  . Vaping Use: Never used  Substance Use Topics  . Alcohol use: No    Comment: 12/19/2014 "stopped drinking in ~ 2012"  . Drug use: No    Current Outpatient Medications  Medication Sig Dispense Refill  . acetaminophen (TYLENOL) 500 MG tablet Take 500-1,000 mg by mouth every 6 (six) hours as needed (for Murray).    . celecoxib (CELEBREX) 200 MG capsule Take 1 capsule (200 mg total) by mouth 2 (two) times daily as needed for mild Murray. 10 capsule 0  . dofetilide (TIKOSYN) 250 MCG capsule Take 1 capsule (250 mcg total) by mouth 2 (two) times daily. 60 capsule 6  . doxazosin (CARDURA) 4 MG tablet Take 4 mg by mouth daily.     Marland Kitchen esomeprazole (NEXIUM) 20 MG capsule Take 20  mg by mouth daily before breakfast.    . fluticasone (FLONASE) 50 MCG/ACT nasal spray Place 1 spray into both nostrils daily as needed for allergies or rhinitis.    . Glycerin-Hypromellose-PEG 400 (DRY EYE RELIEF DROPS) 0.2-0.2-1 % SOLN Place 1 drop into both eyes daily as needed (for dryness). Saline    . HYDROcodone-homatropine (HYCODAN) 5-1.5 MG/5ML  syrup Take 5 mLs by mouth every 6 (six) hours as needed for cough.    . metoprolol succinate (TOPROL-XL) 25 MG 24 hr tablet TAKE 1/2 TABLET (12.5 MG TOTAL) BY MOUTH AT BEDTIME. 30 tablet 3  . montelukast (SINGULAIR) 10 MG tablet Take 10 mg by mouth every morning.     . Multiple Vitamins-Minerals (PRESERVISION AREDS 2 PO) Take 1 capsule by mouth in the morning and at bedtime.     . potassium chloride SA (KLOR-CON) 20 MEQ tablet Take 47meq (4 tablets by mouth) on Monday, Wednesday and Friday - the days you take your diuretic, Torsemide.  Take 80meq (1 tablet by mouth) on the days you do not take your diuretic. 90 tablet 3  . rivaroxaban (XARELTO) 20 MG TABS tablet Take 1 tablet (20 mg total) by mouth daily with supper. 30 tablet 11  . torsemide (DEMADEX) 20 MG tablet Monday, Wednesday and Friday you will take 40mg  ( 2 tablets by mouth) 60 tablet 2  . vitamin C (ASCORBIC ACID) 500 MG tablet Take 500 mg by mouth at bedtime.     Marland Kitchen atorvastatin (LIPITOR) 20 MG tablet Take 1 tablet (20 mg total) by mouth daily. 30 tablet 3   No current facility-administered medications for this visit.    Allergies  Allergen Reactions  . Lisinopril Palpitations and Other (See Comments)    Dizziness, also    Review of Systems  Constitutional: Positive for activity change and fatigue. Negative for unexpected weight change.  HENT: Positive for hearing loss.   Respiratory: Positive for shortness of breath (With exertion).   Cardiovascular: Positive for leg swelling (Controlled with diuretics). Negative for palpitations.       Atrial fibrillation  Musculoskeletal:  Positive for arthralgias and joint swelling.  Hematological: Bruises/bleeds easily.  All other systems reviewed and are negative.   BP 120/77 (BP Location: Right Arm, Patient Position: Sitting)   Pulse 72   Resp 20   Ht 5\' 10"  (1.778 m)   Wt 160 lb (72.6 kg)   SpO2 95% Comment: RA  BMI 22.96 kg/m  Physical Exam Constitutional:      General: He is not in acute distress.    Appearance: Normal appearance.  HENT:     Head: Normocephalic and atraumatic.  Eyes:     General: No scleral icterus. Cardiovascular:     Rate and Rhythm: Normal rate and regular rhythm.     Heart sounds: Normal heart sounds. No murmur heard.   Pulmonary:     Effort: Pulmonary effort is normal. No respiratory distress.     Breath sounds: Normal breath sounds. No wheezing or rales.  Abdominal:     General: There is no distension.     Palpations: Abdomen is soft.  Musculoskeletal:     Cervical back: Neck supple.  Lymphadenopathy:     Cervical: No cervical adenopathy.  Skin:    General: Skin is warm and dry.  Neurological:     General: No focal deficit present.     Mental Status: He is alert and oriented to person, place, and time.     Cranial Nerves: No cranial nerve deficit.     Motor: No weakness.     Gait: Gait normal.    Diagnostic Tests: NUCLEAR MEDICINE PET SKULL BASE TO THIGH  TECHNIQUE: 7.8 mCi F-18 FDG was injected intravenously. Full-ring PET imaging was performed from the skull base to thigh after the radiotracer. CT data was obtained and used for attenuation correction and anatomic localization.  Fasting blood glucose: 96 mg/dl  COMPARISON:  Prior PET CT from 2021 and most recent chest CT of October 14, 2020 and intervening examinations available for comparison.  FINDINGS: Mediastinal blood pool activity: SUV max 2.02  Liver activity: SUV max NA  NECK: No hypermetabolic lymph nodes in the neck.  Incidental CT findings: none  CHEST: Mild fullness of the RIGHT hilum  with similar appearance to recent chest CT showing a maximum SUV of 4.2. Subcarinal lymph nodes and AP window lymph nodes/LEFT paratracheal lymph nodes with mild prominence displaying metabolic activity only slightly greater than blood pool activity at approximately 2.6. Subcarinal and LEFT paratracheal lymph nodes are less than a cm and unchanged from recent imaging. No suspicious pulmonary nodule, parenchymal assessment is limited by respiratory motion in there are changes in the RIGHT chest related to superior segment resection in the RIGHT lower lobe.  Incidental CT findings: Calcified atheromatous plaque of the thoracic aorta with stable aneurysmal dilation of the ascending thoracic aorta approximately 4.2 cm. Peripheral pericardial calcification is quite dense about the heart. No visible pericardial effusion. Dual lead pacer device remains in place. Central pulmonary vasculature stable caliber. Limited assessment of cardiovascular structures given lack of intravenous contrast.  Signs of septal thickening in the chest appear worse than on previous imaging, associated with ground-glass, slight increase in bilateral pleural effusions layering dependently in the LEFT and RIGHT chest compared to previous evaluation from October 14, 2020.  Esophagus grossly normal.  ABDOMEN/PELVIS: No abnormal hypermetabolic activity within the liver, pancreas, adrenal glands, or spleen. No hypermetabolic lymph nodes in the abdomen or pelvis.  Incidental CT findings: Liver, gallbladder, spleen, pancreas and adrenal glands without acute process. Cyst in the upper pole the LEFT kidney as before. No hydronephrosis. Smooth contour of the urinary bladder. No acute gastrointestinal process. Normal appendix.  Aortic atherosclerosis with 2.6 cm infrarenal abdominal aorta.  The  SKELETON: No focal hypermetabolic activity to suggest skeletal metastasis.  Incidental CT findings: Thoracic spine  compression fracture at the T11 level with similar appearance, associated with kyphosis. Degenerative changes in the cervical spine associated with mild FDG uptake on the RIGHT  IMPRESSION: 1. RIGHT hilar lymph node mildly suspicious for involvement given low level activity, greater than blood pool and background liver. Reactive changes are also considered. 2. Other lymph nodes in the chest without significant metabolic activity, close attention on follow-up is suggested. 3. Increased septal thickening, ground-glass and effusions, correlate with any respiratory symptoms or evidence of heart failure or volume overload. Effusions are slightly larger than the recent chest CT but remains smaller than the study of November of 2021 4. Pericardial calcifications as before, correlate with any signs of restrictive physiology. 5. Calcified aortic atherosclerosis and aneurysmal disease as on prior imaging, refer to previous reports recommendations. 6. Trace free fluid in the pelvis could be related to volume status. No other signs to indicate acute findings in the abdomen or pelvis.   Electronically Signed   By: Alexander Murray M.D.   On: 11/01/2020 10:32 I personally reviewed the PET CT images.  There is a hypermetabolic node at the bifurcation of the upper lobe bronchus and bronchus intermedius.  No other concerning lymph nodes.  Significant coronary and thoracic atherosclerotic calcifications.  Extensive pericardial calcification.  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  He was  treated with adjuvant chemotherapy with carboplatin and etoposide.  He recently had a follow-up PET/CT which showed moderate hypermetabolic activity in a right hilar node.  We discussed the findings and reviewed the PET images.  He understands that this could represent metastatic disease but is nonspecific.  Options include continued radiographic follow-up versus endobronchial ultrasound to aspirate the node.  He wishes to proceed with endobronchial ultrasound.  I recommended that we do a bronchoscopy and endobronchial ultrasound for diagnostic purposes.  We would plan to do this in the operating room under general anesthesia.  We will plan to do this as an outpatient procedure.  I informed him of the general nature of the procedure as well as the indications, risks, benefits, and alternatives.  He understands the risk include associated with general anesthesia.  Procedure specific risks include failure to establish a diagnosis, bleeding, and pneumothorax, as well as the possibility of other unforeseeable complications.  He accepts the risks and wishes to proceed.  He is on Xarelto for his atrial fibrillation.  He currently is in a paced rhythm.  He will hold that for 2 days prior to the biopsy.  Plan: Hold Xarelto for 2 days prior to biopsy Bronchoscopy and endobronchial ultrasound on Monday, 11/18/2020  Melrose Nakayama, MD [facility] [phone]

## 2020-11-13 NOTE — Progress Notes (Signed)
PCP is [name], MD Referring Provider is Curt Bears, MD  Chief Complaint  Patient presents with  . Follow-up    Hx of segmentectomy 03/2020, CT chest 3/14, PET 3/31    HPI: [name] sent for consultation regarding right hilar adenopathy.  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   He underwent a right lower lobe superior segmentectomy in August 2021 by Dr. Servando Snare.  Pathology showed a T2, N0, stage Ib small cell carcinoma.  He then underwent chemotherapy with 4 cycles of carboplatin and etoposide.  He recently had a follow-up visit with Dr. Julien Nordmann and a PET/CT showed some metabolic activity in a right hilar lymph node.  There were no other sites of disease.  Zubrod Score: At the time of surgery this patient's most appropriate activity status/level should be described as: []     0    Normal activity, no symptoms [x]     1    Restricted in physical strenuous activity but ambulatory, able to do out light work []     2    Ambulatory and capable of self care, unable to do work activities, up and about >50 % of waking hours                              []     3    Only limited self care, in bed greater than 50% of waking hours []     4    Completely disabled, no self care, confined to bed or chair []     5    Moribund  Past Medical History:  Diagnosis Date  . Allergy   . Arthritis    "right knee and right thumb" (12/19/2014)  . Asthma    pt denies  . Bronchitis 07/2016   started in December and continued about 2 months  . Central retinal artery occlusion 06/26/2013  . CHF (congestive heart failure) ([place]) 08/24/2020  . Esophageal stricture   . GERD (gastroesophageal reflux disease)   .  Hemochromatosis   . Hiatal hernia   . Hypercalcemia   . Hypercholesteremia   . Hypertension   . Internal hemorrhoids   . Lung cancer ([place])   . Osteoarthritis   . Palpitations   . Persistent atrial fibrillation ([place])   . Presence of permanent cardiac pacemaker    hx bradycardia  . Retinal artery occlusion, branch    "right eye"  . Second degree AV block, Mobitz type II   . Skin cancer    right shoulder  . Small cell lung cancer, right lower lobe ([place]) 03/12/2020  . Stroke [facility]) ~ 2011   "right eye"/ partial blindness  . Tubular adenoma of colon     Past Surgical History:  Procedure Laterality Date  . CARDIOVERSION N/A 07/29/2020   Procedure: CARDIOVERSION;  Surgeon: Jerline Pain, MD;  Location: [place];  Service: Cardiovascular;  Laterality: N/A;  NEEDS RAPID COVID TEST MORNING OF  . CARDIOVERSION N/A 08/19/2020   Procedure: CARDIOVERSION;  Surgeon: Thayer Headings, MD;  Location: [place];  Service: Cardiovascular;  Laterality: N/A;  . CATARACT EXTRACTION, BILATERAL  01/2017  . COLONOSCOPY    . EP IMPLANTABLE DEVICE N/A 12/19/2014   Procedure: Pacemaker Implant;  Surgeon: Revonda Standard  Caryl Comes, MD;  Location: [facility];  Service: Cardiovascular;  Laterality: N/A;  . ESOPHAGOGASTRODUODENOSCOPY (EGD) WITH ESOPHAGEAL DILATION  X 3  . EYE SURGERY Bilateral 2017  . INGUINAL HERNIA REPAIR Left 1980's  . INSERT / REPLACE / REMOVE PACEMAKER  12/19/2014  . INTERCOSTAL NERVE BLOCK Right 03/11/2020   Procedure: INTERCOSTAL NERVE BLOCK;  Surgeon: Grace Isaac, MD;  Location: [place];  Service: Thoracic;  Laterality: Right;  . JOINT REPLACEMENT Right 2016  . KNEE ARTHROSCOPY Right ~ 1982; ~ 1992  . MOHS SURGERY Right ~ 2014   "pre-melanoma scapula"  . POSTERIOR TIBIAL TENDON REPAIR Left 2012  . TONSILLECTOMY  1946  . TOTAL KNEE ARTHROPLASTY Right 05/20/2015   Procedure: RIGHT TOTAL KNEE ARTHROPLASTY;  Surgeon: Paralee Cancel, MD;  Location: [facility];  Service: Orthopedics;   Laterality: Right;  . UPPER GASTROINTESTINAL ENDOSCOPY    . VIDEO BRONCHOSCOPY N/A 03/11/2020   Procedure: VIDEO BRONCHOSCOPY;  Surgeon: Grace Isaac, MD;  Location: [facility] OR;  Service: Thoracic;  Laterality: N/A;  . XI ROBOTIC ASSISTED THORACOSCOPY- SEGMENTECTOMY Right 03/11/2020   Procedure: XI ROBOTIC ASSISTED THORACOSCOPY-RIGHT SUPERIOR SEGMENTECTOMY WITH NODE SAMPLES;  Surgeon: Grace Isaac, MD;  Location: [facility] OR;  Service: Thoracic;  Laterality: Right;    Family History  Problem Relation Age of Onset  . Colon cancer Mother        dx in her 29s  . Heart disease Mother   . Alzheimer's disease Mother   . Colon cancer Father        dx in his early 50s  . Heart disease Father   . Prostate cancer Father   . Prostate cancer Brother        1/2 brother  . Hemochromatosis Brother   . Prostate cancer Brother   . Esophageal cancer Neg Hx   . Pancreatic cancer Neg Hx   . Stomach cancer Neg Hx   . Liver disease Neg Hx     Social History Social History   Tobacco Use  . Smoking status: Former Smoker    Years: 3.00    Types: Cigarettes    Quit date: 05/16/1962    Years since quitting: 58.5  . Smokeless tobacco: Never Used  . Tobacco comment: "quit smoking in the 1960's"; was an intermittent smoker   Vaping Use  . Vaping Use: Never used  Substance Use Topics  . Alcohol use: No    Comment: 12/19/2014 "stopped drinking in ~ 2012"  . Drug use: No    Current Outpatient Medications  Medication Sig Dispense Refill  . acetaminophen (TYLENOL) 500 MG tablet Take 500-1,000 mg by mouth every 6 (six) hours as needed (for pain).    . celecoxib (CELEBREX) 200 MG capsule Take 1 capsule (200 mg total) by mouth 2 (two) times daily as needed for mild pain. 10 capsule 0  . dofetilide (TIKOSYN) 250 MCG capsule Take 1 capsule (250 mcg total) by mouth 2 (two) times daily. 60 capsule 6  . doxazosin (CARDURA) 4 MG tablet Take 4 mg by mouth daily.     Marland Kitchen esomeprazole (NEXIUM) 20 MG capsule Take 20  mg by mouth daily before breakfast.    . fluticasone (FLONASE) 50 MCG/ACT nasal spray Place 1 spray into both nostrils daily as needed for allergies or rhinitis.    . Glycerin-Hypromellose-PEG 400 (DRY EYE RELIEF DROPS) 0.2-0.2-1 % SOLN Place 1 drop into both eyes daily as needed (for dryness). Saline    . HYDROcodone-homatropine (HYCODAN) 5-1.5 MG/5ML  syrup Take 5 mLs by mouth every 6 (six) hours as needed for cough.    . metoprolol succinate (TOPROL-XL) 25 MG 24 hr tablet TAKE 1/2 TABLET (12.5 MG TOTAL) BY MOUTH AT BEDTIME. 30 tablet 3  . montelukast (SINGULAIR) 10 MG tablet Take 10 mg by mouth every morning.     . Multiple Vitamins-Minerals (PRESERVISION AREDS 2 PO) Take 1 capsule by mouth in the morning and at bedtime.     . potassium chloride SA (KLOR-CON) 20 MEQ tablet Take 74meq (4 tablets by mouth) on Monday, Wednesday and Friday - the days you take your diuretic, Torsemide.  Take 55meq (1 tablet by mouth) on the days you do not take your diuretic. 90 tablet 3  . rivaroxaban (XARELTO) 20 MG TABS tablet Take 1 tablet (20 mg total) by mouth daily with supper. 30 tablet 11  . torsemide (DEMADEX) 20 MG tablet Monday, Wednesday and Friday you will take 40mg  ( 2 tablets by mouth) 60 tablet 2  . vitamin C (ASCORBIC ACID) 500 MG tablet Take 500 mg by mouth at bedtime.     Marland Kitchen atorvastatin (LIPITOR) 20 MG tablet Take 1 tablet (20 mg total) by mouth daily. 30 tablet 3   No current facility-administered medications for this visit.    Allergies  Allergen Reactions  . Lisinopril Palpitations and Other (See Comments)    Dizziness, also    Review of Systems  Constitutional: Positive for activity change and fatigue. Negative for unexpected weight change.  HENT: Positive for hearing loss.   Respiratory: Positive for shortness of breath (With exertion).   Cardiovascular: Positive for leg swelling (Controlled with diuretics). Negative for palpitations.       Atrial fibrillation  Musculoskeletal:  Positive for arthralgias and joint swelling.  Hematological: Bruises/bleeds easily.  All other systems reviewed and are negative.   BP 120/77 (BP Location: Right Arm, Patient Position: Sitting)   Pulse 72   Resp 20   Ht 5\' 10"  (1.778 m)   Wt 160 lb (72.6 kg)   SpO2 95% Comment: RA  BMI 22.96 kg/m  Physical Exam Constitutional:      General: He is not in acute distress.    Appearance: Normal appearance.  HENT:     Head: Normocephalic and atraumatic.  Eyes:     General: No scleral icterus. Cardiovascular:     Rate and Rhythm: Normal rate and regular rhythm.     Heart sounds: Normal heart sounds. No murmur heard.   Pulmonary:     Effort: Pulmonary effort is normal. No respiratory distress.     Breath sounds: Normal breath sounds. No wheezing or rales.  Abdominal:     General: There is no distension.     Palpations: Abdomen is soft.  Musculoskeletal:     Cervical back: Neck supple.  Lymphadenopathy:     Cervical: No cervical adenopathy.  Skin:    General: Skin is warm and dry.  Neurological:     General: No focal deficit present.     Mental Status: He is alert and oriented to person, place, and time.     Cranial Nerves: No cranial nerve deficit.     Motor: No weakness.     Gait: Gait normal.    Diagnostic Tests: NUCLEAR MEDICINE PET SKULL BASE TO THIGH  TECHNIQUE: 7.8 mCi F-18 FDG was injected intravenously. Full-ring PET imaging was performed from the skull base to thigh after the radiotracer. CT data was obtained and used for attenuation correction and anatomic localization.  Fasting blood glucose: 96 mg/dl  COMPARISON:  Prior PET CT from 2021 and most recent chest CT of October 14, 2020 and intervening examinations available for comparison.  FINDINGS: Mediastinal blood pool activity: SUV max 2.02  Liver activity: SUV max NA  NECK: No hypermetabolic lymph nodes in the neck.  Incidental CT findings: none  CHEST: Mild fullness of the RIGHT hilum  with similar appearance to recent chest CT showing a maximum SUV of 4.2. Subcarinal lymph nodes and AP window lymph nodes/LEFT paratracheal lymph nodes with mild prominence displaying metabolic activity only slightly greater than blood pool activity at approximately 2.6. Subcarinal and LEFT paratracheal lymph nodes are less than a cm and unchanged from recent imaging. No suspicious pulmonary nodule, parenchymal assessment is limited by respiratory motion in there are changes in the RIGHT chest related to superior segment resection in the RIGHT lower lobe.  Incidental CT findings: Calcified atheromatous plaque of the thoracic aorta with stable aneurysmal dilation of the ascending thoracic aorta approximately 4.2 cm. Peripheral pericardial calcification is quite dense about the heart. No visible pericardial effusion. Dual lead pacer device remains in place. Central pulmonary vasculature stable caliber. Limited assessment of cardiovascular structures given lack of intravenous contrast.  Signs of septal thickening in the chest appear worse than on previous imaging, associated with ground-glass, slight increase in bilateral pleural effusions layering dependently in the LEFT and RIGHT chest compared to previous evaluation from October 14, 2020.  Esophagus grossly normal.  ABDOMEN/PELVIS: No abnormal hypermetabolic activity within the liver, pancreas, adrenal glands, or spleen. No hypermetabolic lymph nodes in the abdomen or pelvis.  Incidental CT findings: Liver, gallbladder, spleen, pancreas and adrenal glands without acute process. Cyst in the upper pole the LEFT kidney as before. No hydronephrosis. Smooth contour of the urinary bladder. No acute gastrointestinal process. Normal appendix.  Aortic atherosclerosis with 2.6 cm infrarenal abdominal aorta.  The  SKELETON: No focal hypermetabolic activity to suggest skeletal metastasis.  Incidental CT findings: Thoracic spine  compression fracture at the T11 level with similar appearance, associated with kyphosis. Degenerative changes in the cervical spine associated with mild FDG uptake on the RIGHT  IMPRESSION: 1. RIGHT hilar lymph node mildly suspicious for involvement given low level activity, greater than blood pool and background liver. Reactive changes are also considered. 2. Other lymph nodes in the chest without significant metabolic activity, close attention on follow-up is suggested. 3. Increased septal thickening, ground-glass and effusions, correlate with any respiratory symptoms or evidence of heart failure or volume overload. Effusions are slightly larger than the recent chest CT but remains smaller than the study of November of 2021 4. Pericardial calcifications as before, correlate with any signs of restrictive physiology. 5. Calcified aortic atherosclerosis and aneurysmal disease as on prior imaging, refer to previous reports recommendations. 6. Trace free fluid in the pelvis could be related to volume status. No other signs to indicate acute findings in the abdomen or pelvis.   Electronically Signed   By: Zetta Bills M.D.   On: 11/01/2020 10:32 I personally reviewed the PET CT images.  There is a hypermetabolic node at the bifurcation of the upper lobe bronchus and bronchus intermedius.  No other concerning lymph nodes.  Significant coronary and thoracic atherosclerotic calcifications.  Extensive pericardial calcification.  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  He was  treated with adjuvant chemotherapy with carboplatin and etoposide.  He recently had a follow-up PET/CT which showed moderate hypermetabolic activity in a right hilar node.  We discussed the findings and reviewed the PET images.  He understands that this could represent metastatic disease but is nonspecific.  Options include continued radiographic follow-up versus endobronchial ultrasound to aspirate the node.  He wishes to proceed with endobronchial ultrasound.  I recommended that we do a bronchoscopy and endobronchial ultrasound for diagnostic purposes.  We would plan to do this in the operating room under general anesthesia.  We will plan to do this as an outpatient procedure.  I informed him of the general nature of the procedure as well as the indications, risks, benefits, and alternatives.  He understands the risk include associated with general anesthesia.  Procedure specific risks include failure to establish a diagnosis, bleeding, and pneumothorax, as well as the possibility of other unforeseeable complications.  He accepts the risks and wishes to proceed.  He is on Xarelto for his atrial fibrillation.  He currently is in a paced rhythm.  He will hold that for 2 days prior to the biopsy.  Plan: Hold Xarelto for 2 days prior to biopsy Bronchoscopy and endobronchial ultrasound on Monday, 11/18/2020  Melrose Nakayama, MD [facility] [phone]

## 2020-11-14 ENCOUNTER — Encounter: Payer: Medicare Other | Admitting: Thoracic Surgery (Cardiothoracic Vascular Surgery)

## 2020-11-14 ENCOUNTER — Telehealth: Payer: Self-pay | Admitting: Medical Oncology

## 2020-11-14 NOTE — Telephone Encounter (Signed)
Sent a text to his PCP about the blood work

## 2020-11-14 NOTE — Progress Notes (Signed)
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 He aspirated after 01/05/20 endoscopy and developed a persistent cough leading to imaging with ultimate diagnosis of hypermetabolic RLL lung nodule. S/p robotic assisted thoracoscopy with RLL superior segmentectomy with LN smapling 03/11/20 for stage 1B small cell lung cancer. S/p chemotherapy (4 cycles). Most recent PET scan showed no concerning evidence for metastasis except for hypermetabolic right hilar lymph node. Dr. Julien Nordmann referred [name] to Dr. Roxan Hockey for consideration of bronchoscopy and hilar node biopsy. If pathology positive then radiotherapy may be considered.   History includes former smoker (quit 05/16/62, only 3 pack year history), HTN, HLD, palpitations, symptomatic bradycardia/2nd degree AV block (s/p dual chamber [employer]tronic PPM 12/19/14), afib (subclincial afib detected on PPM interrogation with persistent afib 07/2020, s/p DCCV 07/29/20, 08/19/20), small cell lung cancer (s/p RLL segmentectomy 03/11/20, s/p chemotherapy), hemochromatosis, right eye retinal artery branch occlusion (01/2009, brain MRI and carotid US unremarkable per PCP notes; was on  Plavix and ASA for stroke prevention, but Plavix stopped when Xarelto started for afib 07/05/20), GERD, hiatal hernia, esophageal stricture (s/p Maloney dilation of esophagus 08/09/13, 01/05/20), hypercalcemia, TKA (right 05/20/15).   Dr. Roxan Hockey classified patient's Zubrod Score as 1 (restricted in physical strenuous activity but ambulatory, able to do out light work).  Last evaluated by Dr. Caryl Comes 10/28/20. Normal [employer] PPM function. His slope on  exertional rate was increased from 3 to 4 and peak sensor rate from 120 to 130 to held with DOE. Fluid management discussed including fluid intake and diuretic strategies.   Perioperative cardiac device recommendations per EP: Device Information: Clinic EP Physician:  [name], MD  Device Type:  Pacemaker Manufacturer and Phone #:  [employer]: [phone] Pacemaker Dependent?:  Yes.   Date of Last Device Check:  10/28/20           Normal Device Function?:  Yes.    Electrophysiologist's Recommendations:  Have magnet available.  Provide continuous ECG monitoring when magnet is used or reprogramming is to be performed.   Procedure may interfere with device function.  Magnet should be placed over device during procedure.   Dr. Roxan Hockey advised holding Xarelto for 2 days prior to biopsy. Last dose scheduled for 11/16/20. 11/15/20 PT/INR 24.1/2.2 and PTT 38. Will repeat on the day of surgery.   11/15/20 presurgical COVID-19 testand CXR are in process. Anesthesia team to evaluate on the day of surgery.   VS: BP 131/86   Pulse 72   Temp 36.4 C   Resp 18   Ht 5\' 9"  (1.753 m)   Wt 72.5 kg   SpO2 96%   BMI 23.61 kg/m    PROVIDERS: [name], MD is PCP  - [name], MD is EP cardiologist. Last visit 10/28/20. Also followed at the [facility] by Malka So, [place], last visit 09/04/20. Curt Bears, MD is HEM-ONC. Last visit 11/06/20.  Scarlette Shorts, MD is GI   LABS: Preoperative labs noted.  We will repeat PT PTT on the day of surgery. (all labs ordered are listed, but only abnormal results are displayed)  Labs Reviewed  APTT - Abnormal; Notable for the following components:  Result Value   aPTT 38 (*)    All other components within normal limits  CBC - Abnormal; Notable for the following components:   RBC 3.76 (*)    Hemoglobin 11.5 (*)    HCT 35.2 (*)    RDW 19.6 (*)    Platelets 121 (*)    All other components within normal limits  COMPREHENSIVE  METABOLIC PANEL - Abnormal; Notable for the following components:   Calcium 10.4 (*)    Alkaline Phosphatase 308 (*)    Total Bilirubin 2.6 (*)    All other components within normal limits  PROTIME-INR - Abnormal; Notable for the following components:   Prothrombin Time 24.1 (*)    INR 2.2 (*)    All other components within normal limits  SURGICAL PCR SCREEN     IMAGES: CXR 11/15/20: In process.  PET Scan 10/31/20: IMPRESSION: 1. RIGHT hilar lymph node mildly suspicious for involvement given low level activity, greater than blood pool and background liver. Reactive changes are also considered. 2. Other lymph nodes in the chest without significant metabolic activity, close attention on follow-up is suggested. 3. Increased septal thickening, ground-glass and effusions, correlate with any respiratory symptoms or evidence of heart failure or volume overload. Effusions are slightly larger than the recent chest CT but remains smaller than the study of November of 2021 4. Pericardial calcifications as before, correlate with any signs of restrictive physiology. 5. Calcified aortic atherosclerosis and aneurysmal disease as on prior imaging, refer to previous reports recommendations. 6. Trace free fluid in the pelvis could be related to volume status. No other signs to indicate acute findings in the abdomen or pelvis.  CT Chest 10/14/20: IMPRESSION: 1. Interval decrease in size of the small bilateral pleural effusions seen previously. 2. Interval progression of right hilar and subcarinal lymph nodes, now mildly enlarged. These may be reactive, but follow-up recommended to ensure stability. Consider repeat CT chest in 3 months. Stable compression fractures at T7 and T11. 3. Pericardial calcification, as before. 4. 4.1 cm ascending thoracic aorta. Recommend annual imaging followup by CTA or MRA. This recommendation follows 2010 ACCF/AHA/AATS/ACR/ASA/SCA/SCAI/SIR/STS/SVM Guidelines for  the Diagnosis and Management of Patients with Thoracic Aortic Disease. Circulation. 2010; 121: E720-N470. Aortic aneurysm NOS (ICD10-I71.9) 5. Aortic Atherosclerosis (ICD10-I70.0).   EKG: 10/28/20: AV paced rhythm.   CV: Echo 08/14/20: IMPRESSIONS  1. Left ventricular ejection fraction, by estimation, is 60 to 65%. The  left ventricle has normal function. Left ventricular endocardial border  not optimally defined to evaluate regional wall motion. Left ventricular  diastolic parameters are  indeterminate.  2. Right ventricular systolic function is mildly reduced. The right  ventricular size is mildly enlarged. There is normal pulmonary artery  systolic pressure. The estimated right ventricular systolic pressure is  96.2 mmHg.  3. Left atrial size was moderately dilated.  4. Right atrial size was mild to moderately dilated.  5. The mitral valve is degenerative. Trivial mitral valve regurgitation.  No evidence of mitral stenosis. Moderate mitral annular calcification.  6. The aortic valve is abnormal. There is moderate calcification of the  aortic valve with fixed left coronary cusp and relatively preserved motion  of the non-coronary cusp and right coronary cusp. Aortic valve  regurgitation is not visualized. Moderate  aortic valve sclerosis/calcification is present, without any evidence of  aortic stenosis, however Doppler interrogation only performed in one  location.  7. Aortic dilatation noted. There is mild to moderate dilatation of the  ascending aorta, measuring 44 mm.  8. The inferior vena cava is dilated in size with <50% respiratory  variability, suggesting right atrial pressure of 15 mmHg.    - Unsuccessful TEE/DCCV attempt on 07/12/20, because unable to pass TEE probe even with direct visualization with Glidescope. Given history of esophageal stricture and aspiration, decision made to abort TEE and anticoagulate for 3 weeks before attempting DCCV.    - S/p  Implantation of a MDT dual chamber PPM on 12/19/14 by Dr Caryl Comes   Nuclear stress test 11/26/14: Overall Impression:  Low risk stress nuclear study with small, mild, fixed anteroseptal and inferior defects (possible soft tissue attenuation); no ischemia; note patient in type 1 second degree AV block on baseline ECG; HR increased to 127 with exercise; Mobitz 1, 2:1 AV block and occasional junctional beats in recovery; patient seen by Dr Harrington Challenger prior to leaving the office. LV Ejection Fraction: 59%. LV Wall Motion: NL LV Function; NL Wall Motion   Past Medical History:  Diagnosis Date  . Allergy   . Arthritis    "right knee and right thumb" (12/19/2014)  . Asthma    pt denies  . Bronchitis 07/2016   started in December and continued about 2 months  . Central retinal artery occlusion 06/26/2013  . CHF (congestive heart failure) ([place]) 08/24/2020  . Esophageal stricture   . GERD (gastroesophageal reflux disease)   . Hemochromatosis   . Hiatal hernia   . Hypercalcemia   . Hypercholesteremia   . Hypertension   . Internal hemorrhoids   . Lung cancer ([place])   . Osteoarthritis   . Palpitations   . Persistent atrial fibrillation ([place])   . Presence of permanent cardiac pacemaker    hx bradycardia  . Retinal artery occlusion, branch    "right eye"  . Second degree AV block, Mobitz type II   . Skin cancer    right shoulder  . Small cell lung cancer, right lower lobe ([place]) 03/12/2020  . Stroke [facility]) ~ 2011   "right eye"/ partial blindness  . Tubular adenoma of colon     Past Surgical History:  Procedure Laterality Date  . CARDIOVERSION N/A 07/29/2020   Procedure: CARDIOVERSION;  Surgeon: Jerline Pain, MD;  Location: [place];  Service: Cardiovascular;  Laterality: N/A;  NEEDS RAPID COVID TEST MORNING OF  . CARDIOVERSION N/A 08/19/2020   Procedure: CARDIOVERSION;  Surgeon: Thayer Headings, MD;  Location: [place];  Service: Cardiovascular;  Laterality: N/A;  . CATARACT  EXTRACTION, BILATERAL  01/2017  . COLONOSCOPY    . EP IMPLANTABLE DEVICE N/A 12/19/2014   Procedure: Pacemaker Implant;  Surgeon: Deboraha Sprang, MD;  Location: [place] CV LAB;  Service: Cardiovascular;  Laterality: N/A;  . ESOPHAGOGASTRODUODENOSCOPY (EGD) WITH ESOPHAGEAL DILATION  X 3  . EYE SURGERY Bilateral 2017  . INGUINAL HERNIA REPAIR Left 1980's  . INSERT / REPLACE / REMOVE PACEMAKER  12/19/2014  . INTERCOSTAL NERVE BLOCK Right 03/11/2020   Procedure: INTERCOSTAL NERVE BLOCK;  Surgeon: Grace Isaac, MD;  Location: [place];  Service: Thoracic;  Laterality: Right;  . JOINT REPLACEMENT Right 2016  . KNEE ARTHROSCOPY Right ~ 1982; ~ 1992  . MOHS SURGERY Right ~ 2014   "pre-melanoma scapula"  . POSTERIOR TIBIAL TENDON REPAIR Left 2012  . TONSILLECTOMY  1946  . TOTAL KNEE ARTHROPLASTY Right 05/20/2015   Procedure: RIGHT TOTAL KNEE ARTHROPLASTY;  Surgeon: Paralee Cancel, MD;  Location: [facility];  Service: Orthopedics;  Laterality: Right;  . UPPER GASTROINTESTINAL ENDOSCOPY    .  VIDEO BRONCHOSCOPY N/A 03/11/2020   Procedure: VIDEO BRONCHOSCOPY;  Surgeon: Grace Isaac, MD;  Location: [place];  Service: Thoracic;  Laterality: N/A;  . XI ROBOTIC ASSISTED THORACOSCOPY- SEGMENTECTOMY Right 03/11/2020   Procedure: XI ROBOTIC ASSISTED THORACOSCOPY-RIGHT SUPERIOR SEGMENTECTOMY WITH NODE SAMPLES;  Surgeon: Grace Isaac, MD;  [facility];  Service: Thoracic;  Laterality: Right;    MEDICATIONS: . acetaminophen (TYLENOL) 500 MG tablet  . atorvastatin (LIPITOR) 20 MG tablet  . celecoxib (CELEBREX) 200 MG capsule  . dofetilide (TIKOSYN) 250 MCG capsule  . doxazosin (CARDURA) 4 MG tablet  . esomeprazole (NEXIUM) 20 MG capsule  . fluticasone (FLONASE) 50 MCG/ACT nasal spray  . Glycerin-Hypromellose-PEG 400 (DRY EYE RELIEF DROPS) 0.2-0.2-1 % SOLN  . HYDROcodone-homatropine (HYCODAN) 5-1.5 MG/5ML syrup  . metoprolol succinate (TOPROL-XL) 25 MG 24 hr tablet  . montelukast (SINGULAIR) 10 MG  tablet  . Multiple Vitamins-Minerals (PRESERVISION AREDS 2 PO)  . potassium chloride SA (KLOR-CON) 20 MEQ tablet  . rivaroxaban (XARELTO) 20 MG TABS tablet  . torsemide (DEMADEX) 20 MG tablet  . vitamin C (ASCORBIC ACID) 500 MG tablet   No current facility-administered medications for this encounter.    Myra Gianotti, PA-C Surgical Short Stay/Anesthesiology [facility] Phone [phone] [facility] Phone [phone] 11/15/2020 3:36 PM

## 2020-11-14 NOTE — Telephone Encounter (Signed)
-----   Message from Curt Bears, MD sent at 11/13/2020  3:26 PM EDT ----- Regarding: RE: next appt No.  July is fine. ----- Message ----- From: Ardeen Garland, RN Sent: 11/13/2020  12:35 PM EDT To: Curt Bears, MD, [place] [facility] Subject: next appt                                      Pt  had Phlebotomy yesterday for 500 cc whole blood.   He asked if  you need to see him back before July appt?  Alexander Murray

## 2020-11-14 NOTE — Progress Notes (Signed)
Surgical Instructions    Your procedure is scheduled on 11/18/20.  Report to [facility] Main Entrance "A" at 5:45 A.M., then check in with the Admitting office.  Call this number if you have problems the morning of surgery:  [phone]   If you have any questions prior to your surgery date call [phone]: Open Monday-Friday 8am-4pm    Remember:  Do not eat OR drink after midnight the night before your surgery    Take these medicines the morning of surgery with A SIP OF WATER: atorvastatin (LIPITOR) dofetilide (TIKOSYN)  doxazosin (CARDURA)  esomeprazole (NEXIUM) fluticasone (FLONASE) montelukast (SINGULAIR)  If needed:  acetaminophen (TYLENOL)  Glycerin-Hypromellose-PEG 400 (DRY EYE RELIEF DROPS)  HYDROcodone-homatropine (HYCODAN)  As of today, STOP taking any Aspirin (unless otherwise instructed by your surgeon), Celebrex,  Aleve, Naproxen, Ibuprofen, Motrin, Advil, Goody's, BC's, all herbal medications, fish oil, and all vitamins.  Follow your surgeon's instructions on when to stop Xarelto.  If no instructions were given by your surgeon then you will need to call the office to get those instructions.                       Do not wear jewelry            Do not wear lotions, powders, colognes, or deodorant.            Do not shave 48 hours prior to surgery.  Men may shave face and neck.            Do not bring valuables to the hospital.            [facility] is not responsible for any belongings or valuables.  Do NOT Smoke (Tobacco/Vaping) or drink Alcohol 24 hours prior to your procedure If you use a CPAP at night, you may bring all equipment for your overnight stay.   Contacts, glasses, dentures or bridgework may not be worn into surgery, please bring cases for these belongings   For patients admitted to the hospital, discharge time will be determined by your treatment team.   Patients discharged the day of surgery will not be allowed to drive home, and someone  needs to stay with them for 24 hours.    Special instructions:   [place]- Preparing For Surgery  Before surgery, you can play an important role. Because skin is not sterile, your skin needs to be as free of germs as possible. You can reduce the number of germs on your skin by washing with CHG (chlorahexidine gluconate) Soap before surgery.  CHG is an antiseptic cleaner which kills germs and bonds with the skin to continue killing germs even after washing.    Oral Hygiene is also important to reduce your risk of infection.  Remember - BRUSH YOUR TEETH THE MORNING OF SURGERY WITH YOUR REGULAR TOOTHPASTE  Please do not use if you have an allergy to CHG or antibacterial soaps. If your skin becomes reddened/irritated stop using the CHG.  Do not shave (including legs and underarms) for at least 48 hours prior to first CHG shower. It is OK to shave your face.  Please follow these instructions carefully.   1. Shower the NIGHT BEFORE SURGERY and the MORNING OF SURGERY  2. If you chose to wash your hair, wash your hair first as usual with your normal shampoo.  3. After you shampoo, rinse your hair and body thoroughly to remove the shampoo.  4. Wash Face and genitals (private  parts) with your normal soap.   5.  Shower the NIGHT BEFORE SURGERY and the MORNING OF SURGERY with CHG Soap.   6. Use CHG Soap as you would any other liquid soap. You can apply CHG directly to the skin and wash gently with a scrungie or a clean washcloth.   7. Apply the CHG Soap to your body ONLY FROM THE NECK DOWN.  Do not use on open wounds or open sores. Avoid contact with your eyes, ears, mouth and genitals (private parts). Wash Face and genitals (private parts)  with your normal soap.   8. Wash thoroughly, paying special attention to the area where your surgery will be performed.  9. Thoroughly rinse your body with warm water from the neck down.  10. DO NOT shower/wash with your normal soap after using and  rinsing off the CHG Soap.  11. Pat yourself dry with a CLEAN TOWEL.  12. Wear CLEAN PAJAMAS to bed the night before surgery  13. Place CLEAN SHEETS on your bed the night before your surgery  14. DO NOT SLEEP WITH PETS.   Day of Surgery: Take a shower with CHG soap. Wear Clean/Comfortable clothing the morning of surgery Do not apply any deodorants/lotions.   Remember to brush your teeth WITH YOUR REGULAR TOOTHPASTE.   Please read over the following fact sheets that you were given.

## 2020-11-14 NOTE — Progress Notes (Signed)
Device clinic doc completed and email sent to Gae Dry at [employer].  CC's Lindsi Forte.

## 2020-11-14 NOTE — Telephone Encounter (Signed)
Pt.notified

## 2020-11-15 ENCOUNTER — Other Ambulatory Visit: Payer: Self-pay

## 2020-11-15 ENCOUNTER — Encounter (HOSPITAL_COMMUNITY): Payer: Self-pay

## 2020-11-15 ENCOUNTER — Encounter (HOSPITAL_COMMUNITY)
Admission: RE | Admit: 2020-11-15 | Discharge: 2020-11-15 | Disposition: A | Discharge status: Home / Self Care | Payer: Medicare Other | Source: Ambulatory Visit | Attending: Thoracic Surgery (Cardiothoracic Vascular Surgery) | Admitting: Thoracic Surgery (Cardiothoracic Vascular Surgery)

## 2020-11-15 ENCOUNTER — Encounter: Payer: Self-pay | Admitting: Internal Medicine

## 2020-11-15 ENCOUNTER — Other Ambulatory Visit (HOSPITAL_COMMUNITY)
Admission: RE | Admit: 2020-11-15 | Discharge: 2020-11-15 | Disposition: A | Discharge status: Home / Self Care | Payer: Medicare Other | Source: Ambulatory Visit | Attending: Thoracic Surgery (Cardiothoracic Vascular Surgery) | Admitting: Thoracic Surgery (Cardiothoracic Vascular Surgery)

## 2020-11-15 ENCOUNTER — Ambulatory Visit (HOSPITAL_COMMUNITY)
Admission: RE | Admit: 2020-11-15 | Discharge: 2020-11-15 | Disposition: A | Discharge status: Home / Self Care | Payer: Medicare Other | Source: Ambulatory Visit | Attending: Thoracic Surgery (Cardiothoracic Vascular Surgery) | Admitting: Thoracic Surgery (Cardiothoracic Vascular Surgery)

## 2020-11-15 DIAGNOSIS — Z01818 Encounter for other preprocedural examination: Secondary | ICD-10-CM | POA: Insufficient documentation

## 2020-11-15 DIAGNOSIS — R590 Localized enlarged lymph nodes: Secondary | ICD-10-CM

## 2020-11-15 DIAGNOSIS — Z85118 Personal history of other malignant neoplasm of bronchus and lung: Secondary | ICD-10-CM | POA: Diagnosis not present

## 2020-11-15 DIAGNOSIS — Z7901 Long term (current) use of anticoagulants: Secondary | ICD-10-CM | POA: Insufficient documentation

## 2020-11-15 DIAGNOSIS — Z87891 Personal history of nicotine dependence: Secondary | ICD-10-CM | POA: Diagnosis not present

## 2020-11-15 DIAGNOSIS — Z950 Presence of cardiac pacemaker: Secondary | ICD-10-CM | POA: Diagnosis not present

## 2020-11-15 DIAGNOSIS — Z20822 Contact with and (suspected) exposure to covid-19: Secondary | ICD-10-CM | POA: Insufficient documentation

## 2020-11-15 DIAGNOSIS — Z9221 Personal history of antineoplastic chemotherapy: Secondary | ICD-10-CM | POA: Diagnosis not present

## 2020-11-15 DIAGNOSIS — R918 Other nonspecific abnormal finding of lung field: Secondary | ICD-10-CM | POA: Diagnosis not present

## 2020-11-15 DIAGNOSIS — I4891 Unspecified atrial fibrillation: Secondary | ICD-10-CM | POA: Insufficient documentation

## 2020-11-15 LAB — CBC
HCT: 35.2 % — ABNORMAL LOW (ref 39.0–52.0)
Hemoglobin: 11.5 g/dL — ABNORMAL LOW (ref 13.0–17.0)
MCH: 30.6 pg (ref 26.0–34.0)
MCHC: 32.7 g/dL (ref 30.0–36.0)
MCV: 93.6 fL (ref 80.0–100.0)
Platelets: 121 10*3/uL — ABNORMAL LOW (ref 150–400)
RBC: 3.76 MIL/uL — ABNORMAL LOW (ref 4.22–5.81)
RDW: 19.6 % — ABNORMAL HIGH (ref 11.5–15.5)
WBC: 5.4 10*3/uL (ref 4.0–10.5)
nRBC: 0 % (ref 0.0–0.2)

## 2020-11-15 LAB — SARS CORONAVIRUS 2 (TAT 6-24 HRS): SARS Coronavirus 2: NEGATIVE

## 2020-11-15 LAB — APTT: aPTT: 38 seconds — ABNORMAL HIGH (ref 24–36)

## 2020-11-15 LAB — COMPREHENSIVE METABOLIC PANEL
ALT: 18 U/L (ref 0–44)
AST: 28 U/L (ref 15–41)
Albumin: 3.9 g/dL (ref 3.5–5.0)
Alkaline Phosphatase: 308 U/L — ABNORMAL HIGH (ref 38–126)
Anion gap: 8 (ref 5–15)
BUN: 16 mg/dL (ref 8–23)
CO2: 27 mmol/L (ref 22–32)
Calcium: 10.4 mg/dL — ABNORMAL HIGH (ref 8.9–10.3)
Chloride: 105 mmol/L (ref 98–111)
Creatinine, Ser: 1.03 mg/dL (ref 0.61–1.24)
GFR, Estimated: >60 mL/min (ref >60)
Glucose, Bld: 78 mg/dL (ref 70–99)
Potassium: 4.3 mmol/L (ref 3.5–5.1)
Sodium: 140 mmol/L (ref 135–145)
Total Bilirubin: 2.6 mg/dL — ABNORMAL HIGH (ref 0.3–1.2)
Total Protein: 7.2 g/dL (ref 6.5–8.1)

## 2020-11-15 LAB — PROTIME-INR
INR: 2.2 — ABNORMAL HIGH (ref 0.8–1.2)
Prothrombin Time: 24.1 seconds — ABNORMAL HIGH (ref 11.4–15.2)

## 2020-11-15 LAB — SURGICAL PCR SCREEN
MRSA, PCR: NEGATIVE
Staphylococcus aureus: NEGATIVE

## 2020-11-15 IMAGING — CR DG CHEST 2V
2 series · 2 of 2 positions shown · non-contrast
Comparison: [DATE], prior CT [DATE]

CLINICAL DATA: 79-year-old male with no current chest complaints,
upcoming bronchoscopy and biopsy

EXAM:
CHEST - 2 VIEW

[w chest ap]
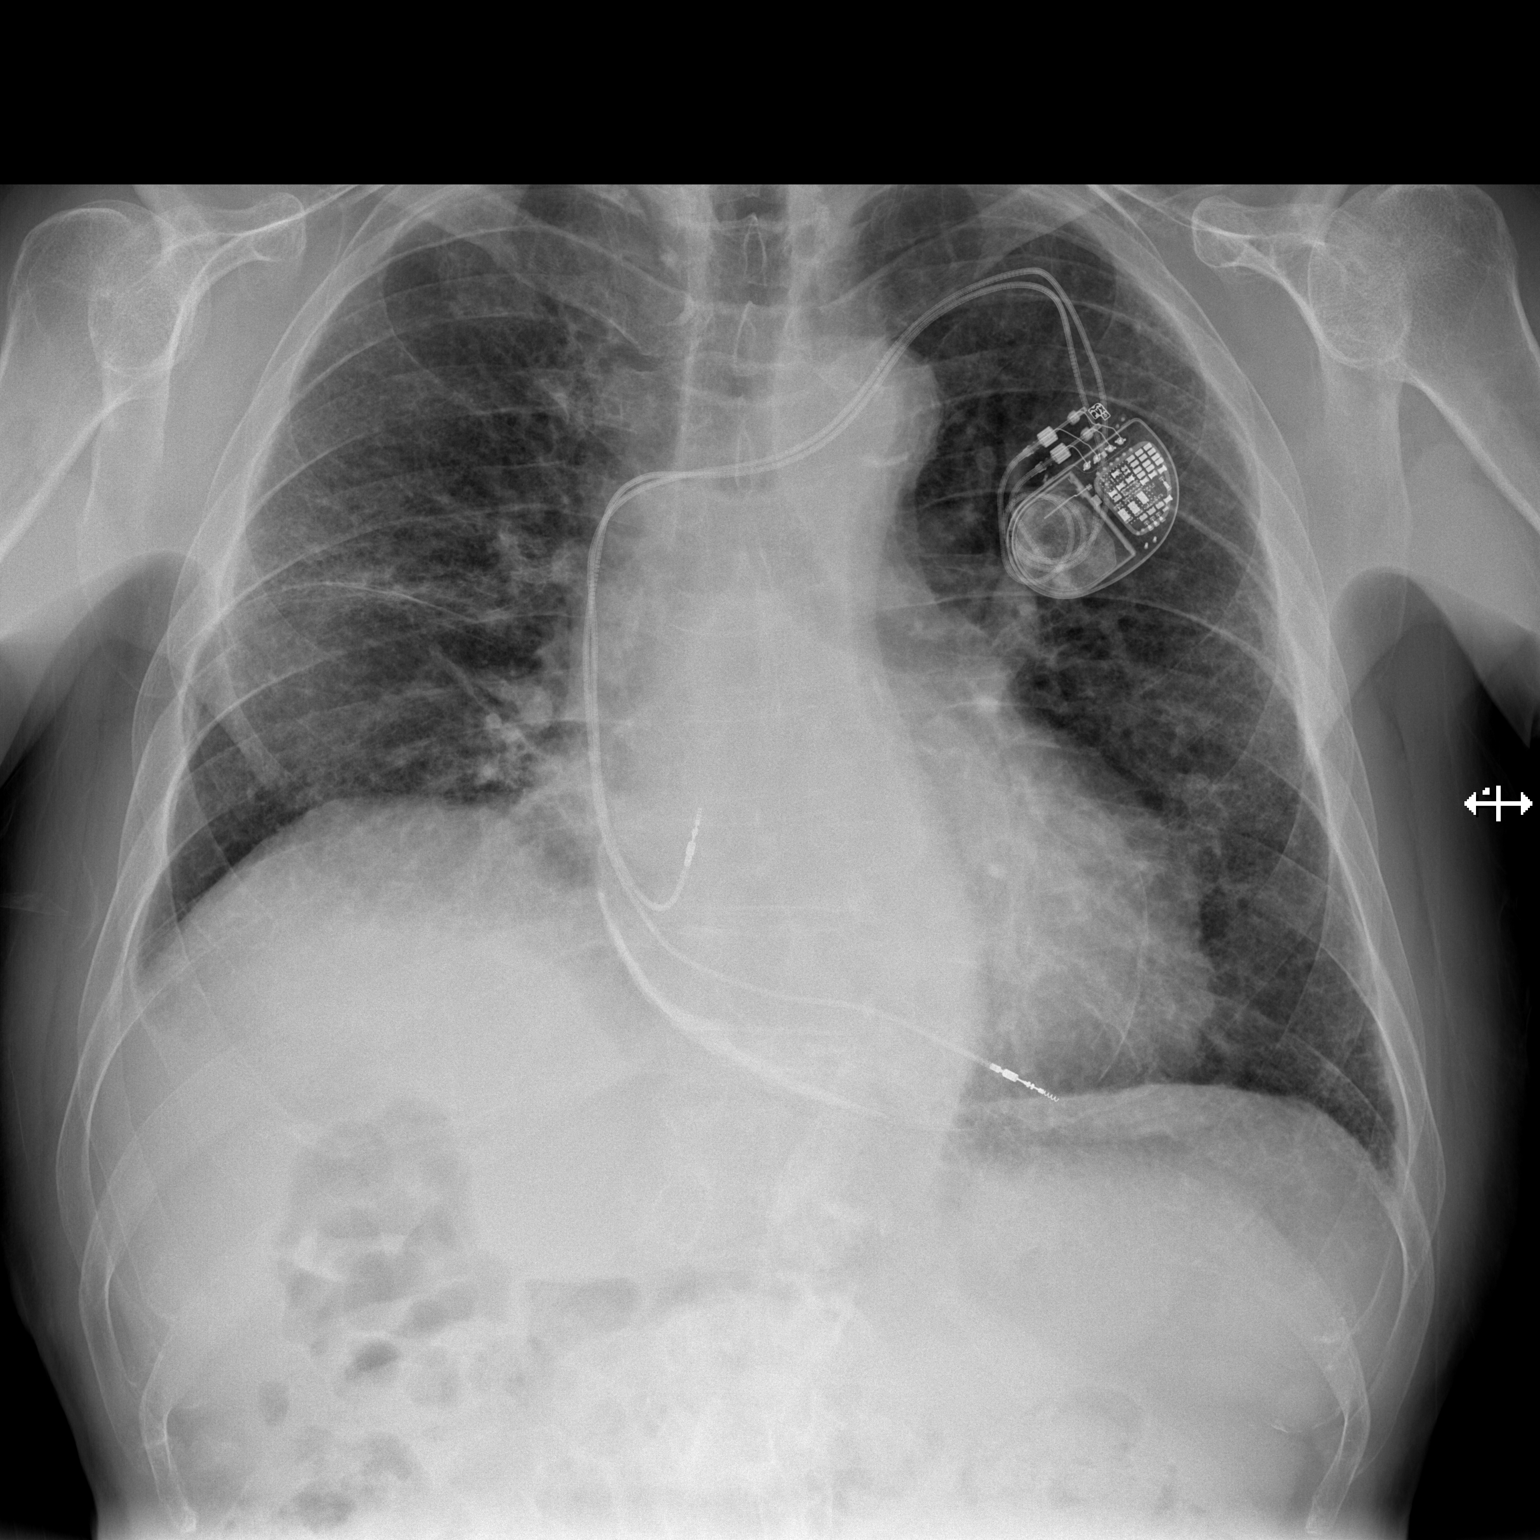

[w chest lat]
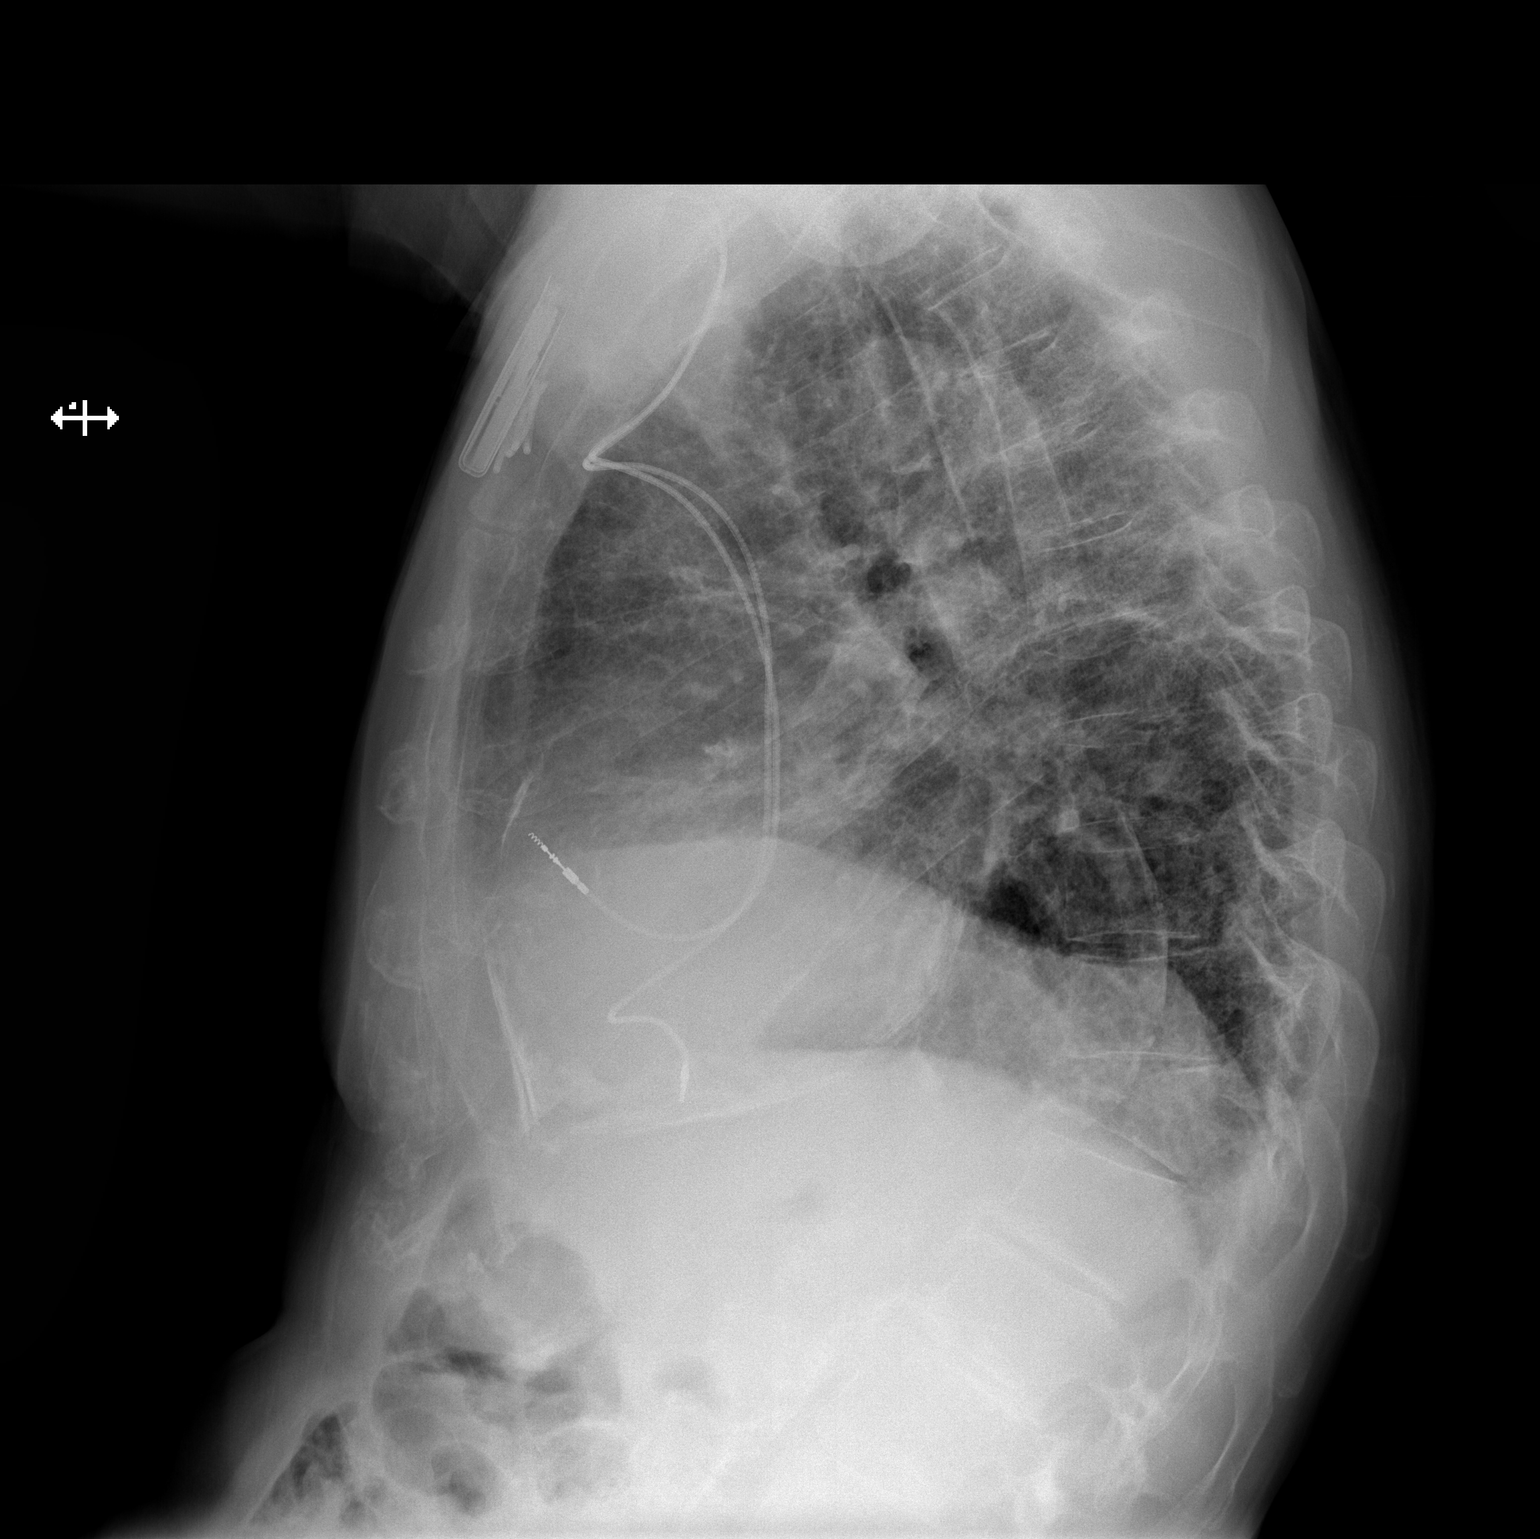

[2 of 2 positions shown; findings below may reference images not displayed]

FINDINGS: Cardiomediastinal silhouette unchanged in size and contour, with
pericardial calcifications. Similar appearance of asymmetric
elevation of the right hemidiaphragm. Unchanged left chest wall
cardiac pacing device.

Coarsened interstitial markings of the bilateral lungs. No
interlobular septal thickening. No pneumothorax or pleural effusion.
No new confluent airspace disease.
IMPRESSION: Chronic lung changes without evidence of acute cardiopulmonary
disease.

Redemonstration of pericardial calcifications.

## 2020-11-15 NOTE — Anesthesia Preprocedure Evaluation (Deleted)
Anesthesia Evaluation    Airway        Dental   Pulmonary former smoker,           Cardiovascular hypertension,      Neuro/Psych    GI/Hepatic   Endo/Other    Renal/GU      Musculoskeletal   Abdominal   Peds  Hematology   Anesthesia Other Findings   Reproductive/Obstetrics                             Anesthesia Physical Anesthesia Plan  ASA:   Anesthesia Plan:    Post-op Pain Management:    Induction:   PONV Risk Score and Plan:   Airway Management Planned:   Additional Equipment:   Intra-op Plan:   Post-operative Plan:   Informed Consent:   Plan Discussed with:   Anesthesia Plan Comments: (PAT note written 11/15/2020 by Myra Gianotti, PA-C. )        Anesthesia Quick Evaluation

## 2020-11-15 NOTE — Progress Notes (Signed)
PERIOPERATIVE PRESCRIPTION FOR IMPLANTED CARDIAC DEVICE PROGRAMMING  Patient Information: Name:  Alexander Murray, Dr.  Alferd Apa:  1940/10/23  MRN:  [id number]    Alexander Clay, RN  P Cv Div Heartcare Device Cc: Alexander Bigness, RN Patient: Alexander Murray   Planned Procedure: Video bronchoscopy with endobronchial ultrasound  Surgeon: Dr. Excell Seltzer  Date of Procedure: 11/18/20  Time of Procedure: 0745  Arrival Time: 1443   Cautery will be used.  Position during surgery:    Please send documentation back to:  [facility] Pontes (Fax # [phone])   Alexander Clay, RN  04/16/2020 10:39 AM   Device Information:  Clinic EP Physician:  [name], MD   Device Type:  Pacemaker Manufacturer and Phone #:  [employer]: [phone] Pacemaker Dependent?:  Yes.   Date of Last Device Check:  10/28/20 Normal Device Function?:  Yes.    Electrophysiologist's Recommendations:   Have magnet available.  Provide continuous ECG monitoring when magnet is used or reprogramming is to be performed.   Procedure may interfere with device function.  Magnet should be placed over device during procedure.  Per Device Clinic Standing Orders, York Ram, RN  12:56 PM 11/15/2020

## 2020-11-15 NOTE — Progress Notes (Signed)
PCP: [name], MD Cardiologist: [name], MD  EKG: 10/28/20 CXR: 11/15/20 ECHO: 08/14/20 Stress Test: 11/27/14 Cardiac Cath: Denies  Fasting Blood Sugar- na Checks Blood Sugar_na__ times a day  OSA/CPAP: No  ASA: No Blood Thinners: Xarelto last dose 11/16/20  Covid test 11/15/20 pending  Anesthesia Review: yes.  PPM, CHF, Mobitz   Patient denies shortness of breath, fever, cough, and chest pain at PAT appointment.  Patient verbalized understanding of instructions provided today at the PAT appointment.  Patient asked to review instructions at home and day of surgery.

## 2020-11-16 ENCOUNTER — Other Ambulatory Visit (HOSPITAL_COMMUNITY): Payer: Self-pay | Admitting: Physician Assistant

## 2020-11-17 NOTE — Anesthesia Preprocedure Evaluation (Addendum)
Anesthesia Evaluation  Patient identified by MRN, date of birth, ID band Patient awake    Reviewed: Allergy & Precautions, NPO status , Patient's Chart, lab work & pertinent test results  History of Anesthesia Complications Negative for: history of anesthetic complications  Airway Mallampati: II  TM Distance: >3 FB Neck ROM: Full    Dental no notable dental hx. (+) Dental Advisory Given   Pulmonary asthma , former smoker,  Lung Ca   Pulmonary exam normal        Cardiovascular hypertension, Pt. on medications Normal cardiovascular exam+ dysrhythmias Atrial Fibrillation + pacemaker   Normal EF. Valves WNL. Dilated aortic root stable at 4.4cm   Neuro/Psych CVA    GI/Hepatic Neg liver ROS, hiatal hernia, GERD  ,Hemachromatosis   Endo/Other  negative endocrine ROS  Renal/GU negative Renal ROS     Musculoskeletal  (+) Arthritis ,   Abdominal   Peds  Hematology  (+) anemia ,   Anesthesia Other Findings   Reproductive/Obstetrics                            Anesthesia Physical  Anesthesia Plan  ASA: III  Anesthesia Plan: General   Post-op Pain Management:    Induction: Intravenous  PONV Risk Score and Plan: 2 and Ondansetron and Dexamethasone  Airway Management Planned: Oral ETT  Additional Equipment:   Intra-op Plan:   Post-operative Plan: Extubation in OR  Informed Consent: I have reviewed the patients History and Physical, chart, labs and discussed the procedure including the risks, benefits and alternatives for the proposed anesthesia with the patient or authorized representative who has indicated his/her understanding and acceptance.     Dental advisory given  Plan Discussed with: Anesthesiologist and CRNA  Anesthesia Plan Comments:        Anesthesia Quick Evaluation

## 2020-11-18 ENCOUNTER — Ambulatory Visit (HOSPITAL_COMMUNITY): Payer: Medicare Other | Admitting: Vascular Surgery

## 2020-11-18 ENCOUNTER — Ambulatory Visit (HOSPITAL_COMMUNITY): Payer: Medicare Other | Admitting: Anesthesiology

## 2020-11-18 ENCOUNTER — Ambulatory Visit (HOSPITAL_COMMUNITY): Payer: Medicare Other

## 2020-11-18 ENCOUNTER — Encounter: Payer: Self-pay | Admitting: *Deleted

## 2020-11-18 ENCOUNTER — Encounter (HOSPITAL_COMMUNITY): Payer: Self-pay | Admitting: Thoracic Surgery (Cardiothoracic Vascular Surgery)

## 2020-11-18 ENCOUNTER — Other Ambulatory Visit: Payer: Self-pay

## 2020-11-18 ENCOUNTER — Telehealth: Payer: Self-pay | Admitting: Internal Medicine

## 2020-11-18 ENCOUNTER — Ambulatory Visit (HOSPITAL_COMMUNITY)
Admission: RE | Admit: 2020-11-18 | Discharge: 2020-11-18 | Disposition: A | Discharge status: Home / Self Care | Payer: Medicare Other | Attending: Thoracic Surgery (Cardiothoracic Vascular Surgery) | Admitting: Thoracic Surgery (Cardiothoracic Vascular Surgery)

## 2020-11-18 ENCOUNTER — Encounter (HOSPITAL_COMMUNITY)
Admission: RE | Disposition: A | Discharge status: Home / Self Care | Payer: Self-pay | Source: Home / Self Care | Attending: Thoracic Surgery (Cardiothoracic Vascular Surgery)

## 2020-11-18 DIAGNOSIS — Z888 Allergy status to other drugs, medicaments and biological substances status: Secondary | ICD-10-CM | POA: Diagnosis not present

## 2020-11-18 DIAGNOSIS — I2510 Atherosclerotic heart disease of native coronary artery without angina pectoris: Secondary | ICD-10-CM | POA: Insufficient documentation

## 2020-11-18 DIAGNOSIS — Z87891 Personal history of nicotine dependence: Secondary | ICD-10-CM | POA: Diagnosis not present

## 2020-11-18 DIAGNOSIS — C3490 Malignant neoplasm of unspecified part of unspecified bronchus or lung: Secondary | ICD-10-CM | POA: Diagnosis not present

## 2020-11-18 DIAGNOSIS — I110 Hypertensive heart disease with heart failure: Secondary | ICD-10-CM | POA: Insufficient documentation

## 2020-11-18 DIAGNOSIS — Z01818 Encounter for other preprocedural examination: Secondary | ICD-10-CM

## 2020-11-18 DIAGNOSIS — Z820 Family history of epilepsy and other diseases of the nervous system: Secondary | ICD-10-CM | POA: Diagnosis not present

## 2020-11-18 DIAGNOSIS — I69398 Other sequelae of cerebral infarction: Secondary | ICD-10-CM | POA: Diagnosis not present

## 2020-11-18 DIAGNOSIS — C771 Secondary and unspecified malignant neoplasm of intrathoracic lymph nodes: Secondary | ICD-10-CM | POA: Diagnosis not present

## 2020-11-18 DIAGNOSIS — I4891 Unspecified atrial fibrillation: Secondary | ICD-10-CM | POA: Diagnosis not present

## 2020-11-18 DIAGNOSIS — Z791 Long term (current) use of non-steroidal anti-inflammatories (NSAID): Secondary | ICD-10-CM | POA: Insufficient documentation

## 2020-11-18 DIAGNOSIS — K219 Gastro-esophageal reflux disease without esophagitis: Secondary | ICD-10-CM | POA: Insufficient documentation

## 2020-11-18 DIAGNOSIS — Z800 Family history of malignant neoplasm of digestive organs: Secondary | ICD-10-CM | POA: Insufficient documentation

## 2020-11-18 DIAGNOSIS — Z902 Acquired absence of lung [part of]: Secondary | ICD-10-CM | POA: Insufficient documentation

## 2020-11-18 DIAGNOSIS — Z85118 Personal history of other malignant neoplasm of bronchus and lung: Secondary | ICD-10-CM | POA: Diagnosis not present

## 2020-11-18 DIAGNOSIS — E785 Hyperlipidemia, unspecified: Secondary | ICD-10-CM | POA: Insufficient documentation

## 2020-11-18 DIAGNOSIS — Z8249 Family history of ischemic heart disease and other diseases of the circulatory system: Secondary | ICD-10-CM | POA: Diagnosis not present

## 2020-11-18 DIAGNOSIS — C801 Malignant (primary) neoplasm, unspecified: Secondary | ICD-10-CM | POA: Diagnosis not present

## 2020-11-18 DIAGNOSIS — C3431 Malignant neoplasm of lower lobe, right bronchus or lung: Secondary | ICD-10-CM | POA: Diagnosis not present

## 2020-11-18 DIAGNOSIS — R590 Localized enlarged lymph nodes: Secondary | ICD-10-CM

## 2020-11-18 DIAGNOSIS — Z832 Family history of diseases of the blood and blood-forming organs and certain disorders involving the immune mechanism: Secondary | ICD-10-CM | POA: Diagnosis not present

## 2020-11-18 DIAGNOSIS — H5461 Unqualified visual loss, right eye, normal vision left eye: Secondary | ICD-10-CM | POA: Insufficient documentation

## 2020-11-18 DIAGNOSIS — Z950 Presence of cardiac pacemaker: Secondary | ICD-10-CM | POA: Insufficient documentation

## 2020-11-18 DIAGNOSIS — Z79899 Other long term (current) drug therapy: Secondary | ICD-10-CM | POA: Insufficient documentation

## 2020-11-18 DIAGNOSIS — I4819 Other persistent atrial fibrillation: Secondary | ICD-10-CM | POA: Diagnosis not present

## 2020-11-18 DIAGNOSIS — I441 Atrioventricular block, second degree: Secondary | ICD-10-CM | POA: Insufficient documentation

## 2020-11-18 DIAGNOSIS — Z7901 Long term (current) use of anticoagulants: Secondary | ICD-10-CM | POA: Insufficient documentation

## 2020-11-18 DIAGNOSIS — E7800 Pure hypercholesterolemia, unspecified: Secondary | ICD-10-CM | POA: Diagnosis not present

## 2020-11-18 DIAGNOSIS — Z9221 Personal history of antineoplastic chemotherapy: Secondary | ICD-10-CM | POA: Insufficient documentation

## 2020-11-18 DIAGNOSIS — I509 Heart failure, unspecified: Secondary | ICD-10-CM | POA: Insufficient documentation

## 2020-11-18 DIAGNOSIS — Z8042 Family history of malignant neoplasm of prostate: Secondary | ICD-10-CM | POA: Diagnosis not present

## 2020-11-18 HISTORY — PX: VIDEO BRONCHOSCOPY WITH ENDOBRONCHIAL ULTRASOUND: SHX6177

## 2020-11-18 LAB — PROTIME-INR
INR: 1.4 — ABNORMAL HIGH (ref 0.8–1.2)
Prothrombin Time: 17.1 seconds — ABNORMAL HIGH (ref 11.4–15.2)

## 2020-11-18 LAB — APTT: aPTT: 33 seconds (ref 24–36)

## 2020-11-18 SURGERY — BRONCHOSCOPY, WITH EBUS
Anesthesia: General | Site: Chest

## 2020-11-18 MED ORDER — FENTANYL CITRATE (PF) 250 MCG/5ML IJ SOLN
INTRAMUSCULAR | Status: AC
  Filled 2020-11-18: qty 5

## 2020-11-18 MED ORDER — CHLORHEXIDINE GLUCONATE 0.12 % MT SOLN: 15.0000 mL | Freq: Once | OROMUCOSAL | Status: DC

## 2020-11-18 MED ORDER — LACTATED RINGERS IV SOLN: INTRAVENOUS | Status: DC

## 2020-11-18 MED ORDER — LACTATED RINGERS IV SOLN: INTRAVENOUS | Status: DC | PRN

## 2020-11-18 MED ORDER — PROPOFOL 10 MG/ML IV BOLUS
INTRAVENOUS | Status: AC
  Filled 2020-11-18: qty 20

## 2020-11-18 MED ORDER — EPINEPHRINE PF 1 MG/ML IJ SOLN
INTRAMUSCULAR | Status: DC | PRN
  Administered 2020-11-18: 1 mg via ENDOTRACHEOPULMONARY

## 2020-11-18 MED ORDER — ORAL CARE MOUTH RINSE: 15.0000 mL | Freq: Once | OROMUCOSAL | Status: AC

## 2020-11-18 MED ORDER — SUGAMMADEX SODIUM 200 MG/2ML IV SOLN
INTRAVENOUS | Status: DC | PRN
  Administered 2020-11-18: 300 mg via INTRAVENOUS

## 2020-11-18 MED ORDER — ROCURONIUM BROMIDE 10 MG/ML (PF) SYRINGE
PREFILLED_SYRINGE | INTRAVENOUS | Status: AC
  Filled 2020-11-18: qty 10

## 2020-11-18 MED ORDER — PHENYLEPHRINE 40 MCG/ML (10ML) SYRINGE FOR IV PUSH (FOR BLOOD PRESSURE SUPPORT)
PREFILLED_SYRINGE | INTRAVENOUS | Status: AC
  Filled 2020-11-18: qty 10

## 2020-11-18 MED ORDER — ACETAMINOPHEN 500 MG PO TABS
1000.0000 mg | ORAL_TABLET | Freq: Once | ORAL | Status: AC
  Administered 2020-11-18: 1000 mg via ORAL
  Filled 2020-11-18: qty 2

## 2020-11-18 MED ORDER — ONDANSETRON HCL 4 MG/2ML IJ SOLN
INTRAMUSCULAR | Status: AC
  Filled 2020-11-18: qty 2

## 2020-11-18 MED ORDER — PHENYLEPHRINE HCL-NACL 10-0.9 MG/250ML-% IV SOLN
INTRAVENOUS | Status: DC | PRN
  Administered 2020-11-18: 30 ug/min via INTRAVENOUS

## 2020-11-18 MED ORDER — FENTANYL CITRATE (PF) 100 MCG/2ML IJ SOLN
INTRAMUSCULAR | Status: DC | PRN
  Administered 2020-11-18: 50 ug via INTRAVENOUS

## 2020-11-18 MED ORDER — ORAL CARE MOUTH RINSE: 15.0000 mL | Freq: Once | OROMUCOSAL | Status: DC

## 2020-11-18 MED ORDER — PHENYLEPHRINE 40 MCG/ML (10ML) SYRINGE FOR IV PUSH (FOR BLOOD PRESSURE SUPPORT)
PREFILLED_SYRINGE | INTRAVENOUS | Status: DC | PRN
  Administered 2020-11-18: 80 ug via INTRAVENOUS

## 2020-11-18 MED ORDER — ROCURONIUM BROMIDE 10 MG/ML (PF) SYRINGE
PREFILLED_SYRINGE | INTRAVENOUS | Status: DC | PRN
  Administered 2020-11-18: 100 mg via INTRAVENOUS

## 2020-11-18 MED ORDER — FENTANYL CITRATE (PF) 100 MCG/2ML IJ SOLN: 25.0000 ug | INTRAMUSCULAR | Status: DC | PRN

## 2020-11-18 MED ORDER — LIDOCAINE 2% (20 MG/ML) 5 ML SYRINGE
INTRAMUSCULAR | Status: AC
  Filled 2020-11-18: qty 5

## 2020-11-18 MED ORDER — CHLORHEXIDINE GLUCONATE 0.12 % MT SOLN
15.0000 mL | Freq: Once | OROMUCOSAL | Status: AC
  Administered 2020-11-18: 15 mL via OROMUCOSAL
  Filled 2020-11-18: qty 15

## 2020-11-18 MED ORDER — DEXAMETHASONE SODIUM PHOSPHATE 10 MG/ML IJ SOLN
INTRAMUSCULAR | Status: AC
  Filled 2020-11-18: qty 1

## 2020-11-18 MED ORDER — CELECOXIB 200 MG PO CAPS
200.0000 mg | ORAL_CAPSULE | Freq: Once | ORAL | Status: AC
  Administered 2020-11-18: 200 mg via ORAL
  Filled 2020-11-18: qty 1

## 2020-11-18 MED ORDER — DEXAMETHASONE SODIUM PHOSPHATE 10 MG/ML IJ SOLN
INTRAMUSCULAR | Status: DC | PRN
  Administered 2020-11-18: 10 mg via INTRAVENOUS

## 2020-11-18 MED ORDER — LIDOCAINE 2% (20 MG/ML) 5 ML SYRINGE
INTRAMUSCULAR | Status: DC | PRN
  Administered 2020-11-18: 100 mg via INTRAVENOUS

## 2020-11-18 MED ORDER — ONDANSETRON HCL 4 MG/2ML IJ SOLN
INTRAMUSCULAR | Status: DC | PRN
  Administered 2020-11-18: 4 mg via INTRAVENOUS

## 2020-11-18 MED ORDER — EPINEPHRINE PF 1 MG/ML IJ SOLN
INTRAMUSCULAR | Status: AC
  Filled 2020-11-18: qty 1

## 2020-11-18 MED ORDER — 0.9 % SODIUM CHLORIDE (POUR BTL) OPTIME
TOPICAL | Status: DC | PRN
  Administered 2020-11-18: 1000 mL

## 2020-11-18 MED ORDER — PROPOFOL 10 MG/ML IV BOLUS
INTRAVENOUS | Status: DC | PRN
  Administered 2020-11-18: 150 mg via INTRAVENOUS
  Administered 2020-11-18: 50 mg via INTRAVENOUS

## 2020-11-18 SURGICAL SUPPLY — 41 items
ADAPTER VALVE BIOPSY EBUS (MISCELLANEOUS) IMPLANT
ADPTR VALVE BIOPSY EBUS (MISCELLANEOUS)
BLADE CLIPPER SURG (BLADE) ×2 IMPLANT
BRUSH CYTOL CELLEBRITY 1.5X140 (MISCELLANEOUS) IMPLANT
CANISTER SUCT 3000ML PPV (MISCELLANEOUS) ×2 IMPLANT
CNTNR URN SCR LID CUP LEK RST (MISCELLANEOUS) ×1 IMPLANT
CONT SPEC 4OZ STRL OR WHT (MISCELLANEOUS) ×4
COVER BACK TABLE 60X90IN (DRAPES) ×2 IMPLANT
FILTER STRAW FLUID ASPIR (MISCELLANEOUS) ×1 IMPLANT
FORCEPS BIOP RJ4 1.8 (CUTTING FORCEPS) IMPLANT
FORCEPS RADIAL JAW LRG 4 PULM (INSTRUMENTS) IMPLANT
GAUZE SPONGE 4X4 12PLY STRL (GAUZE/BANDAGES/DRESSINGS) ×1 IMPLANT
GLOVE SURG SIGNA 7.5 PF LTX (GLOVE) ×3 IMPLANT
GOWN STRL REUS W/ TWL XL LVL3 (GOWN DISPOSABLE) ×1 IMPLANT
GOWN STRL REUS W/TWL XL LVL3 (GOWN DISPOSABLE) ×2
KIT CLEAN ENDO COMPLIANCE (KITS) ×4 IMPLANT
KIT TURNOVER KIT B (KITS) ×2 IMPLANT
MARKER SKIN DUAL TIP RULER LAB (MISCELLANEOUS) ×2 IMPLANT
NDL ASPIRATION VIZISHOT 19G (NEEDLE) IMPLANT
NDL ASPIRATION VIZISHOT 21G (NEEDLE) ×1 IMPLANT
NDL BLUNT 18X1 FOR OR ONLY (NEEDLE) IMPLANT
NEEDLE ASPIRATION VIZISHOT 19G (NEEDLE) IMPLANT
NEEDLE ASPIRATION VIZISHOT 21G (NEEDLE) ×4 IMPLANT
NEEDLE BLUNT 18X1 FOR OR ONLY (NEEDLE) IMPLANT
NS IRRIG 1000ML POUR BTL (IV SOLUTION) ×2 IMPLANT
OIL SILICONE PENTAX (PARTS (SERVICE/REPAIRS)) ×2 IMPLANT
PAD ARMBOARD 7.5X6 YLW CONV (MISCELLANEOUS) ×4 IMPLANT
RADIAL JAW LRG 4 PULMONARY (INSTRUMENTS)
SYR 20ML ECCENTRIC (SYRINGE) ×2 IMPLANT
SYR 20ML LL LF (SYRINGE) ×3 IMPLANT
SYR 3ML LL SCALE MARK (SYRINGE) IMPLANT
SYR 5ML LL (SYRINGE) ×2 IMPLANT
SYR 5ML LUER SLIP (SYRINGE) ×2 IMPLANT
TOWEL GREEN STERILE (TOWEL DISPOSABLE) ×3 IMPLANT
TOWEL GREEN STERILE FF (TOWEL DISPOSABLE) ×2 IMPLANT
TRAP SPECIMEN MUCUS 40CC (MISCELLANEOUS) ×2 IMPLANT
TUBE CONNECTING 20X1/4 (TUBING) ×2 IMPLANT
VALVE BIOPSY  SINGLE USE (MISCELLANEOUS) ×4
VALVE BIOPSY SINGLE USE (MISCELLANEOUS) ×1 IMPLANT
VALVE SUCTION BRONCHIO DISP (MISCELLANEOUS) ×2 IMPLANT
WATER STERILE IRR 1000ML POUR (IV SOLUTION) ×2 IMPLANT

## 2020-11-18 NOTE — Brief Op Note (Addendum)
11/18/2020  8:23 AM  PATIENT:  Alexander Murray, Dr.  80 y.o. male  PRE-OPERATIVE DIAGNOSIS:  Right Hilar adenopathy  POST-OPERATIVE DIAGNOSIS:  Right Hilar adenopathy- Poorly differentiated carcinoma  PROCEDURE:  Procedure(s): VIDEO BRONCHOSCOPY WITH ENDOBRONCHIAL ULTRASOUND (N/A) Needle aspirations  SURGEON:  Surgeon(s) and Role:    * Melrose Nakayama, MD - Primary  PHYSICIAN ASSISTANT:   ASSISTANTS: none   ANESTHESIA:   general  EBL:  minimal  BLOOD ADMINISTERED:none  DRAINS: none   LOCAL MEDICATIONS USED:  NONE  SPECIMEN:  Source of Specimen:  11r NODE  DISPOSITION OF SPECIMEN:  PATHOLOGY  COUNTS:  NO endo  TOURNIQUET:  * No tourniquets in log *  DICTATION: .Other Dictation: Dictation Number -  PLAN OF CARE: Discharge to home after PACU  PATIENT DISPOSITION:  PACU - hemodynamically stable.   Delay start of Pharmacological VTE agent (>24hrs) due to surgical blood loss or risk of bleeding: not applicable

## 2020-11-18 NOTE — Anesthesia Procedure Notes (Signed)
Procedure Name: Intubation Date/Time: 11/18/2020 7:44 AM Performed by: Inda Coke, CRNA Pre-anesthesia Checklist: Patient identified, Emergency Drugs available, Suction available and Patient being monitored Patient Re-evaluated:Patient Re-evaluated prior to induction Oxygen Delivery Method: Circle System Utilized Preoxygenation: Pre-oxygenation with 100% oxygen Induction Type: IV induction Ventilation: Mask ventilation without difficulty Laryngoscope Size: Mac and 3 Grade View: Grade II Tube type: Oral Tube size: 9.0 mm Number of attempts: 1 Airway Equipment and Method: Stylet and Oral airway Placement Confirmation: ETT inserted through vocal cords under direct vision,  positive ETCO2 and breath sounds checked- equal and bilateral Secured at: 24 cm Tube secured with: Tape Dental Injury: Teeth and Oropharynx as per pre-operative assessment

## 2020-11-18 NOTE — Anesthesia Postprocedure Evaluation (Signed)
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  Last Vitals:  Vitals:   11/18/20 0903 11/18/20 0918  BP: 103/72 115/74  Pulse: 60 63  Resp: 16 18  Temp: 36.4 C   SpO2: 94% 99%    Last Pain:  Vitals:   11/18/20 0903  TempSrc:   PainSc: 0-No pain                 [name] DANIEL

## 2020-11-18 NOTE — Interval H&P Note (Signed)
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  The various methods of treatment have been discussed with the patient and family. After consideration of risks, benefits and other options for treatment, the patient has consented to  Procedure(s): [place] (N/A) as a surgical intervention.  The patient's history has been reviewed, patient examined, no change in status, stable for surgery.  I have reviewed the patient's chart and labs.  Questions were answered to the patient's satisfaction.     Melrose Nakayama

## 2020-11-18 NOTE — Telephone Encounter (Signed)
Scheduled appt per 4/18 sch msg. Called pt, no answer. Left msg with appt date and time.

## 2020-11-18 NOTE — Progress Notes (Signed)
I received a message from Dr. Julien Nordmann that he would like to see patient soon.  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 Julien Nordmann.

## 2020-11-18 NOTE — Discharge Instructions (Addendum)
Do not drive or engage in heavy physical activity for 24 hours  You may resume normal activities tomorrow  You may cough up small amounts of blood over the next few days.  You may use acetaminophen (Tylenol) if needed for discomfort. You may use an over the counter cough medication and/ or throat lozenges if needed.  Call [phone] if you develop chest pain, shortness of breath, fever > 101 F or cough up more than a tablespoon of blood  Resume Xarelto (rivaroxaban) tomorrow evening

## 2020-11-18 NOTE — Transfer of Care (Signed)
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  Last Pain:  Vitals:   11/18/20 0556  TempSrc:   PainSc: 0-No pain         Complications: No complications documented.

## 2020-11-19 ENCOUNTER — Ambulatory Visit: Payer: Medicare Other | Attending: Internal Medicine

## 2020-11-19 ENCOUNTER — Encounter (HOSPITAL_COMMUNITY): Payer: Self-pay | Admitting: Thoracic Surgery (Cardiothoracic Vascular Surgery)

## 2020-11-19 ENCOUNTER — Other Ambulatory Visit (HOSPITAL_BASED_OUTPATIENT_CLINIC_OR_DEPARTMENT_OTHER): Payer: Self-pay

## 2020-11-19 ENCOUNTER — Other Ambulatory Visit: Payer: Medicare Other

## 2020-11-19 ENCOUNTER — Encounter: Payer: Self-pay | Admitting: Internal Medicine

## 2020-11-19 DIAGNOSIS — Z23 Encounter for immunization: Secondary | ICD-10-CM

## 2020-11-19 MED ORDER — PFIZER-BIONT COVID-19 VAC-TRIS 30 MCG/0.3ML IM SUSP
INTRAMUSCULAR | 0 refills | Status: DC
  Filled 2020-11-19: qty 0.3, 1d supply, fill #0

## 2020-11-19 NOTE — Op Note (Signed)
[name], [name] MEDICAL RECORD NO: [id number] ACCOUNT NO: [id number] DATE OF BIRTH: Aug 26, 1940 FACILITY: [facility] LOCATION: [facility] PHYSICIAN: [name], MD  Operative Report   DATE OF PROCEDURE: 11/18/2020  PREOPERATIVE DIAGNOSIS:  Right hilar adenopathy.  POSTOPERATIVE DIAGNOSIS:  Metastatic small cell carcinoma.  PROCEDURE:  Bronchoscopy and endobronchial ultrasound with needle aspirations of hilar lymph node.  SURGEON:  [name], MD  ASSISTANT:  None.  ANESTHESIA:  General.  FINDINGS:  Probable small cell carcinoma noted on quick prep of level 11R node.  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  He was treated with chemotherapy postoperatively. Recently a PET/CT showed metabolic activity in a  right hilar lymph node.  He was advised to undergo endobronchial ultrasound for diagnostic purposes.  The indications, risks, benefits, and alternatives were discussed in detail with the patient.  He understood and accepted the risks and agreed to  proceed.  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  He had induction of general anesthesia and was intubated.  Sequential compression devices were placed on the calves for DVT prophylaxis.  A Bair Hugger was placed for active  warming.  A timeout was performed.  Flexible fiberoptic bronchoscopy was performed via the endotracheal tube, it revealed normal endobronchial anatomy with no endobronchial lesions to the level of the subsegmental bronchi.  The superior segmental bronchial closure was not evident.  The  endobronchial ultrasound scope then was advanced into the right main stem bronchus and at the bifurcation of the upper lobe bronchus and the bronchus intermedius there was a large node noted.  Needle aspirations were performed with realtime ultrasound   visualization, a 21-gauge needle was used.  Specimens were obtained, both with and without suction applied.  The initial specimens were placed on slides for quick prep and then additional specimens were taken for cell block.  The initial slide showed a poorly differentiated carcinoma.  There was  some minor bleeding into the bronchus, which resolved with irrigation with dilute epinephrine solution.  Final inspection was made.  There was no ongoing bleeding.  The patient then was extubated in the operating room and taken to the postanesthetic care  unit in good condition.   SUJ D: 11/18/2020 5:58:06 pm T: 11/19/2020 7:15:00 am  JOB: [id number]/ [id number]

## 2020-11-19 NOTE — Progress Notes (Signed)
   Covid-19 Vaccination Clinic  Name:  Marcelle Bebout, Dr.    MRN: [id number] DOB: 03-15-1941  11/19/2020  Mr. Waggle was observed post Covid-19 immunization for 15 minutes without incident. He was provided with Vaccine Information Sheet and instruction to access the V-Safe system.   Mr. Zinn was instructed to call 911 with any severe reactions post vaccine: Marland Kitchen Difficulty breathing  . Swelling of face and throat  . A fast heartbeat  . A bad rash all over body  . Dizziness and weakness   Immunizations Administered    Name Date Dose VIS Date Route   PFIZER Comrnaty(Gray TOP) Covid-19 Vaccine 11/19/2020  1:46 PM 0.3 mL 07/11/2020 Intramuscular   Manufacturer: [place]   Lot: [id number]   NDC: [phone]

## 2020-11-20 ENCOUNTER — Inpatient Hospital Stay (HOSPITAL_BASED_OUTPATIENT_CLINIC_OR_DEPARTMENT_OTHER): Payer: Medicare Other | Admitting: Internal Medicine

## 2020-11-20 ENCOUNTER — Inpatient Hospital Stay: Payer: Medicare Other

## 2020-11-20 ENCOUNTER — Other Ambulatory Visit: Payer: Self-pay

## 2020-11-20 ENCOUNTER — Encounter: Payer: Self-pay | Admitting: *Deleted

## 2020-11-20 VITALS — BP 120/70 | HR 89 | Temp 98.2°F | Resp 20 | Ht 69.0 in | Wt 162.5 lb

## 2020-11-20 DIAGNOSIS — Z5111 Encounter for antineoplastic chemotherapy: Secondary | ICD-10-CM

## 2020-11-20 DIAGNOSIS — C3431 Malignant neoplasm of lower lobe, right bronchus or lung: Secondary | ICD-10-CM

## 2020-11-20 DIAGNOSIS — J90 Pleural effusion, not elsewhere classified: Secondary | ICD-10-CM | POA: Diagnosis not present

## 2020-11-20 DIAGNOSIS — R590 Localized enlarged lymph nodes: Secondary | ICD-10-CM | POA: Diagnosis not present

## 2020-11-20 DIAGNOSIS — Z79899 Other long term (current) drug therapy: Secondary | ICD-10-CM | POA: Diagnosis not present

## 2020-11-20 DIAGNOSIS — R1011 Right upper quadrant pain: Secondary | ICD-10-CM | POA: Diagnosis not present

## 2020-11-20 DIAGNOSIS — R5383 Other fatigue: Secondary | ICD-10-CM | POA: Diagnosis not present

## 2020-11-20 LAB — CYTOLOGY - NON PAP

## 2020-11-20 NOTE — Progress Notes (Signed)
[facility] [phone]   [phone]  OFFICE PROGRESS NOTE  Alexander Battles, MD [place] [address]  DIAGNOSIS: Stage IB (T2 a, N0, M0) small cell lung cancer presented with superior segment right lower lobe lung nodule.  PRIOR THERAPY:  1) Status post right lower lobe superior segmentectomy with lymph node sampling on March 12, 2020 under the care of Dr. Servando Snare. 2) Systemic chemotherapy with carboplatin for AUC of 5 on day 1 and etoposide 100 mg/M2 on days 1, 2 and 3 with Neulasta support every 3 weeks.  Status post 4 cycles.  CURRENT THERAPY: Observation.  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 male returns to the clinic today for follow-up visit.  The patient is feeling fine today with no concerning complaints.  He denied having any current chest pain, shortness of breath, cough or hemoptysis.  He denied having any nausea, vomiting, diarrhea or constipation.  He has no headache or visual changes.  He has no significant weight loss or night sweats.  The patient was found on previous CT scan of the chest followed by a PET scan to have suspicious disease recurrence with the right hilar and subcarinal lymphadenopathy.  He was referred to Dr. Roxan Hockey and he underwent repeat bronchoscopy with endobronchial ultrasound and biopsies and the final pathology confirmed the disease recurrence of small cell lung cancer.  He is here today for evaluation and discussion of his treatment options.   MEDICAL HISTORY: Past Medical History:  Diagnosis Date  . Allergy   . Arthritis    "right knee and right thumb" (12/19/2014)  . Asthma    pt denies  . Bronchitis 07/2016   started in December and continued about 2 months  . Central retinal artery occlusion 06/26/2013  . CHF (congestive heart failure) ([place]) 08/24/2020  . Esophageal stricture   . GERD (gastroesophageal reflux disease)   . Hemochromatosis   . Hiatal hernia   .  Hypercalcemia   . Hypercholesteremia   . Hypertension   . Internal hemorrhoids   . Lung cancer ([place])   . Osteoarthritis   . Palpitations   . Persistent atrial fibrillation ([place])   . Presence of permanent cardiac pacemaker    hx bradycardia  . Retinal artery occlusion, branch    "right eye"  . Second degree AV block, Mobitz type II   . Skin cancer    right shoulder  . Small cell lung cancer, right lower lobe ([place]) 03/12/2020  . Stroke [facility]) ~ 2011   "right eye"/ partial blindness  . Tubular adenoma of colon     ALLERGIES:  is allergic to lisinopril.  MEDICATIONS:  Current Outpatient Medications  Medication Sig Dispense Refill  . acetaminophen (TYLENOL) 500 MG tablet Take 500-1,000 mg by mouth every 6 (six) hours as needed for moderate pain.    Marland Kitchen atorvastatin (LIPITOR) 20 MG tablet TAKE 1 TABLET(20 MG) BY MOUTH DAILY 90 tablet 0  . celecoxib (CELEBREX) 200 MG capsule Take 1 capsule (200 mg total) by mouth 2 (two) times daily as needed for mild pain. 10 capsule 0  . COVID-19 mRNA Vac-TriS, Pfizer, (PFIZER-BIONT COVID-19 VAC-TRIS) SUSP injection Inject into the muscle. 0.3 mL 0  . dofetilide (TIKOSYN) 250 MCG capsule Take 1 capsule (250 mcg total) by mouth 2 (two) times daily. 60 capsule 6  . doxazosin (CARDURA) 4 MG tablet Take 4 mg by mouth daily.     Marland Kitchen esomeprazole (NEXIUM) 20 MG capsule Take  20 mg by mouth daily before breakfast.    . fluticasone (FLONASE) 50 MCG/ACT nasal spray Place 1 spray into both nostrils daily.    . Glycerin-Hypromellose-PEG 400 (DRY EYE RELIEF DROPS) 0.2-0.2-1 % SOLN Place 1 drop into both eyes daily as needed (for dryness). Saline    . HYDROcodone-homatropine (HYCODAN) 5-1.5 MG/5ML syrup Take 5 mLs by mouth every 6 (six) hours as needed for cough.    . metoprolol succinate (TOPROL-XL) 25 MG 24 hr tablet TAKE 1/2 TABLET (12.5 MG TOTAL) BY MOUTH AT BEDTIME. (Patient taking differently: Take 12.5 mg by mouth at bedtime.) 30 tablet 3  . montelukast  (SINGULAIR) 10 MG tablet Take 10 mg by mouth every morning.     . Multiple Vitamins-Minerals (PRESERVISION AREDS 2 PO) Take 1 capsule by mouth in the morning and at bedtime.     . potassium chloride SA (KLOR-CON) 20 MEQ tablet Take 71meq (4 tablets by mouth) on Monday, Wednesday and Friday - the days you take your diuretic, Torsemide.  Take 13meq (1 tablet by mouth) on the days you do not take your diuretic. (Patient taking differently: Take 20-80 mEq by mouth See admin instructions. Take 80 meq by mouth on Monday, Wednesday and Friday and 20 meq on Tuesday, Thursday, Saturday and Sunday) 90 tablet 3  . rivaroxaban (XARELTO) 20 MG TABS tablet Take 1 tablet (20 mg total) by mouth daily with supper. 30 tablet 11  . torsemide (DEMADEX) 20 MG tablet Monday, Wednesday and Friday you will take 40mg  ( 2 tablets by mouth) (Patient taking differently: Take 20 mg by mouth daily.) 60 tablet 2  . vitamin C (ASCORBIC ACID) 500 MG tablet Take 500 mg by mouth at bedtime.      No current facility-administered medications for this visit.    SURGICAL HISTORY:  Past Surgical History:  Procedure Laterality Date  . CARDIOVERSION N/A 07/29/2020   Procedure: CARDIOVERSION;  Surgeon: Jerline Pain, MD;  Location: [facility];  Service: Cardiovascular;  Laterality: N/A;  NEEDS RAPID COVID TEST MORNING OF  . CARDIOVERSION N/A 08/19/2020   Procedure: CARDIOVERSION;  Surgeon: Thayer Headings, MD;  Location: [facility];  Service: Cardiovascular;  Laterality: N/A;  . CATARACT EXTRACTION, BILATERAL  01/2017  . COLONOSCOPY    . EP IMPLANTABLE DEVICE N/A 12/19/2014   Procedure: Pacemaker Implant;  Surgeon: Deboraha Sprang, MD;  Location: [facility];  Service: Cardiovascular;  Laterality: N/A;  . ESOPHAGOGASTRODUODENOSCOPY (EGD) WITH ESOPHAGEAL DILATION  X 3  . EYE SURGERY Bilateral 2017  . INGUINAL HERNIA REPAIR Left 1980's  . INSERT / REPLACE / REMOVE PACEMAKER  12/19/2014  . INTERCOSTAL NERVE BLOCK Right 03/11/2020    Procedure: INTERCOSTAL NERVE BLOCK;  Surgeon: Grace Isaac, MD;  Location: [facility];  Service: Thoracic;  Laterality: Right;  . JOINT REPLACEMENT Right 2016  . KNEE ARTHROSCOPY Right ~ 1982; ~ 1992  . MOHS SURGERY Right ~ 2014   "pre-melanoma scapula"  . POSTERIOR TIBIAL TENDON REPAIR Left 2012  . TONSILLECTOMY  1946  . TOTAL KNEE ARTHROPLASTY Right 05/20/2015   Procedure: RIGHT TOTAL KNEE ARTHROPLASTY;  Surgeon: Paralee Cancel, MD;  Location: [facility];  Service: Orthopedics;  Laterality: Right;  . UPPER GASTROINTESTINAL ENDOSCOPY    . VIDEO BRONCHOSCOPY N/A 03/11/2020   Procedure: VIDEO BRONCHOSCOPY;  Surgeon: Grace Isaac, MD;  Location: [facility];  Service: Thoracic;  Laterality: N/A;  . VIDEO BRONCHOSCOPY WITH ENDOBRONCHIAL ULTRASOUND N/A 11/18/2020   Procedure: VIDEO BRONCHOSCOPY WITH ENDOBRONCHIAL ULTRASOUND;  Surgeon: Melrose Nakayama, MD;  Location: [facility];  Service: Thoracic;  Laterality: N/A;  . XI ROBOTIC ASSISTED THORACOSCOPY- SEGMENTECTOMY Right 03/11/2020   Procedure: XI ROBOTIC ASSISTED THORACOSCOPY-RIGHT SUPERIOR SEGMENTECTOMY WITH NODE SAMPLES;  Surgeon: Grace Isaac, MD;  Location: [facility];  Service: Thoracic;  Laterality: Right;    REVIEW OF SYSTEMS:  Constitutional: positive for fatigue Eyes: negative Ears, nose, mouth, throat, and face: negative Respiratory: negative Cardiovascular: negative Gastrointestinal: negative Genitourinary:negative Integument/breast: negative Hematologic/lymphatic: negative Musculoskeletal:negative Neurological: negative Behavioral/Psych: negative Endocrine: negative Allergic/Immunologic: negative   PHYSICAL EXAMINATION: General appearance: alert, cooperative, fatigued and no distress Head: Normocephalic, without obvious abnormality, atraumatic Neck: no adenopathy, no JVD, supple, symmetrical, trachea midline and thyroid not enlarged, symmetric, no tenderness/mass/nodules Lymph nodes: Cervical, supraclavicular, and axillary  nodes normal. Resp: clear to auscultation bilaterally Back: symmetric, no curvature. ROM normal. No CVA tenderness. Cardio: regular rate and rhythm, S1, S2 normal, no murmur, click, rub or gallop GI: soft, non-tender; bowel sounds normal; no masses,  no organomegaly Extremities: extremities normal, atraumatic, no cyanosis or edema Neurologic: Alert and oriented X 3, normal strength and tone. Normal symmetric reflexes. Normal coordination and gait  ECOG PERFORMANCE STATUS: 1 - Symptomatic but completely ambulatory  Blood pressure 120/70, pulse 89, temperature 98.2 F (36.8 C), temperature source Tympanic, resp. rate 20, height 5\' 9"  (1.753 m), weight 162 lb 8 oz (73.7 kg), SpO2 97 %.  LABORATORY DATA: Lab Results  Component Value Date   WBC 5.4 11/15/2020   HGB 11.5 (L) 11/15/2020   HCT 35.2 (L) 11/15/2020   MCV 93.6 11/15/2020   PLT 121 (L) 11/15/2020      Chemistry      Component Value Date/Time   NA 140 11/15/2020 1345   NA 142 10/03/2020 1340   K 4.3 11/15/2020 1345   CL 105 11/15/2020 1345   CO2 27 11/15/2020 1345   BUN 16 11/15/2020 1345   BUN 18 10/03/2020 1340   CREATININE 1.03 11/15/2020 1345   CREATININE 1.09 10/14/2020 0958      Component Value Date/Time   CALCIUM 10.4 (H) 11/15/2020 1345   ALKPHOS 308 (H) 11/15/2020 1345   AST 28 11/15/2020 1345   AST 26 10/14/2020 0958   ALT 18 11/15/2020 1345   ALT 20 10/14/2020 0958   BILITOT 2.6 (H) 11/15/2020 1345   BILITOT 1.9 (H) 10/14/2020 0958       RADIOGRAPHIC STUDIES: DG Chest 2 View  Result Date: 11/15/2020 CLINICAL DATA:  80 year old male with no current chest complaints, upcoming bronchoscopy and biopsy EXAM: CHEST - 2 VIEW COMPARISON:  08/24/2020, prior CT 10/14/2020 FINDINGS: Cardiomediastinal silhouette unchanged in size and contour, with pericardial calcifications. Similar appearance of asymmetric elevation of the right hemidiaphragm. Unchanged left chest wall cardiac pacing device. Coarsened  interstitial markings of the bilateral lungs. No interlobular septal thickening. No pneumothorax or pleural effusion. No new confluent airspace disease. IMPRESSION: Chronic lung changes without evidence of acute cardiopulmonary disease. Redemonstration of pericardial calcifications. Electronically Signed   By: Corrie Mckusick D.O.   On: 11/15/2020 16:11   NM PET Image Restage (PS) Skull Base to Thigh  Result Date: 11/01/2020 CLINICAL DATA:  Subsequent treatment strategy for 80 year old male with history of small cell lung cancer post partial lung resection of the superior segment of the RIGHT lower lobe in 2021 post 4 cycles of systemic therapy including Neulasta. Recent chest CT suspicious for disease worsening. EXAM: NUCLEAR MEDICINE PET SKULL BASE TO THIGH TECHNIQUE: 7.8 mCi F-18 FDG was injected  intravenously. Full-ring PET imaging was performed from the skull base to thigh after the radiotracer. CT data was obtained and used for attenuation correction and anatomic localization. Fasting blood glucose: 96 mg/dl COMPARISON:  Prior PET CT from 2021 and most recent chest CT of October 14, 2020 and intervening examinations available for comparison. FINDINGS: Mediastinal blood pool activity: SUV max 2.02 Liver activity: SUV max NA NECK: No hypermetabolic lymph nodes in the neck. Incidental CT findings: none CHEST: Mild fullness of the RIGHT hilum with similar appearance to recent chest CT showing a maximum SUV of 4.2. Subcarinal lymph nodes and AP window lymph nodes/LEFT paratracheal lymph nodes with mild prominence displaying metabolic activity only slightly greater than blood pool activity at approximately 2.6. Subcarinal and LEFT paratracheal lymph nodes are less than a cm and unchanged from recent imaging. No suspicious pulmonary nodule, parenchymal assessment is limited by respiratory motion in there are changes in the RIGHT chest related to superior segment resection in the RIGHT lower lobe. Incidental CT  findings: Calcified atheromatous plaque of the thoracic aorta with stable aneurysmal dilation of the ascending thoracic aorta approximately 4.2 cm. Peripheral pericardial calcification is quite dense about the heart. No visible pericardial effusion. Dual lead pacer device remains in place. Central pulmonary vasculature stable caliber. Limited assessment of cardiovascular structures given lack of intravenous contrast. Signs of septal thickening in the chest appear worse than on previous imaging, associated with ground-glass, slight increase in bilateral pleural effusions layering dependently in the LEFT and RIGHT chest compared to previous evaluation from October 14, 2020. Esophagus grossly normal. ABDOMEN/PELVIS: No abnormal hypermetabolic activity within the liver, pancreas, adrenal glands, or spleen. No hypermetabolic lymph nodes in the abdomen or pelvis. Incidental CT findings: Liver, gallbladder, spleen, pancreas and adrenal glands without acute process. Cyst in the upper pole the LEFT kidney as before. No hydronephrosis. Smooth contour of the urinary bladder. No acute gastrointestinal process. Normal appendix. Aortic atherosclerosis with 2.6 cm infrarenal abdominal aorta.  The SKELETON: No focal hypermetabolic activity to suggest skeletal metastasis. Incidental CT findings: Thoracic spine compression fracture at the T11 level with similar appearance, associated with kyphosis. Degenerative changes in the cervical spine associated with mild FDG uptake on the RIGHT IMPRESSION: 1. RIGHT hilar lymph node mildly suspicious for involvement given low level activity, greater than blood pool and background liver. Reactive changes are also considered. 2. Other lymph nodes in the chest without significant metabolic activity, close attention on follow-up is suggested. 3. Increased septal thickening, ground-glass and effusions, correlate with any respiratory symptoms or evidence of heart failure or volume overload. Effusions  are slightly larger than the recent chest CT but remains smaller than the study of November of 2021 4. Pericardial calcifications as before, correlate with any signs of restrictive physiology. 5. Calcified aortic atherosclerosis and aneurysmal disease as on prior imaging, refer to previous reports recommendations. 6. Trace free fluid in the pelvis could be related to volume status. No other signs to indicate acute findings in the abdomen or pelvis. Electronically Signed   By: Zetta Bills M.D.   On: 11/01/2020 10:32    ASSESSMENT AND PLAN: This is a very pleasant 80 years old white male diagnosed with a stage Ib (T2 a, N0, M0) small cell lung cancer presented with superior segment right lower lobe nodule status post right lower lobe superior segmentectomy with lymph node dissection in August 2021 under the care of Dr. Servando Snare. The patient completed a course of adjuvant treatment with systemic chemotherapy with carboplatin for  AUC of 5 on day 1 and etoposide 100 mg/M2 on days 1, 2 and 3 with Neulasta support.  He status post 4 cycles. The patient is currently on observation and he is feeling fine except for fatigue and intermittent right upper quadrant abdominal pain. His scan showed interval decrease in the size of the small bilateral pleural effusions that was seen previously but there was interval progression of right hilar and subcarinal lymph nodes mildly enlarged with.  There was a stable compression fracture of T7 and T11. His PET scan showed no concerning evidence for metastasis except for hypermetabolic right hilar lymph node that still suspicious for malignancy.  The patient underwent repeat bronchoscopy with endobronchial ultrasound which confirmed the recurrence of his small cell lung cancer. I had a lengthy discussion with the patient today about his current condition and treatment options. The patient has not received any concurrent radiotherapy in the past during his systemic chemotherapy  after surgery. I recommended for him to see Dr. Lisbeth Renshaw for consideration of radiotherapy to the mediastinal and right hilar disease. Regarding the elevated alkaline phosphatase this could be related to bone disease versus liver disease.  He is currently undergoing evaluation by his primary care physician. For the hemochromatosis, we will continue to monitor closely and may consider The patient for phlebotomy if needed. I will see him back for follow-up visit in around 3 months from now with repeat CT scan of the chest for restaging of his disease. He was advised to call immediately if he has any concerning symptoms in the interval. The patient voices understanding of current disease status and treatment options and is in agreement with the current care plan.  All questions were answered. The patient knows to call the clinic with any problems, questions or concerns. We can certainly see the patient much sooner if necessary.  Disclaimer: This note was dictated with voice recognition software. Similar sounding words can inadvertently be transcribed and may not be corrected upon review.

## 2020-11-20 NOTE — Progress Notes (Signed)
Per Dr. Julien Nordmann, referral to rad onc completed.  I will update their office of referral.

## 2020-11-21 ENCOUNTER — Telehealth: Payer: Self-pay | Admitting: Internal Medicine

## 2020-11-21 NOTE — Telephone Encounter (Signed)
R/s July appts per los. Called, not able to leave msg. Mailed printout

## 2020-11-25 NOTE — Progress Notes (Signed)
Thoracic Location of Tumor / Histology: Small Cell Lung Cancer Right Lower Lobe- Recurrence with right hilar and subcarinal lymphadenopathy.  PRIOR THERAPY:  1) Status post right lower lobe superior segmentectomy with lymph node sampling on March 12, 2020 under the care of Dr. Servando Snare. 2) Systemic chemotherapy with carboplatin for AUC of 5 on day 1 and etoposide 100 mg/M2 on days 1, 2 and 3 with Neulasta support every 3 weeks.  Status post 4 cycles.  Biopsies of RLL Lung 03/11/2021     Tobacco/Marijuana/Snuff/ETOH use: Former smoker, quit 1963.  Past/Anticipated interventions by cardiothoracic surgery, if any:  Dr. Servando Snare -Right lower lobe superior segmentectomy with lymph node sampling 03/11/2020   Past/Anticipated interventions by medical oncology, if any:  Dr. Julien Nordmann 11/20/2020 -The patient is currently on observation and he is feeling fine except for fatigue and intermittent right upper quadrant abdominal pain. -His scan showed interval decrease in the size of the small bilateral pleural effusions that was seen previously but there was interval progression of right hilar and subcarinal lymph nodes mildly enlarged with.  There was a stable compression fracture of T7 and T11. -The patient underwent repeat bronchoscopy with endobronchial ultrasound which confirmed the recurrence of his small cell lung cancer. -The patient has not received any concurrent radiotherapy in the past during his systemic chemotherapy after surgery. -I recommended for him to see Dr. Lisbeth Renshaw for consideration of radiotherapy to the mediastinal and right hilar disease.  Signs/Symptoms  Weight changes, if any: Fluctuates due to fluid shifts, taking diuretic.  Respiratory complaints, if any: Feels a little more short of breath than normal, he has A-Fib.  He will contact the clinic to send remote check.  He hears some wheezing occasionally.  Hemoptysis, if any: Has occasional non productive cough.  Pain issues, if  any:  No  SAFETY ISSUES:  Prior radiation? No  Pacemaker/ICD? Yes, Dr. Jens Som cardiology  Possible current pregnancy? n/a  Is the patient on methotrexate? No  Current Complaints / other details:

## 2020-11-26 ENCOUNTER — Other Ambulatory Visit: Payer: Self-pay

## 2020-11-26 ENCOUNTER — Encounter: Payer: Self-pay | Admitting: Radiation Oncology

## 2020-11-26 ENCOUNTER — Ambulatory Visit
Admission: RE | Admit: 2020-11-26 | Discharge: 2020-11-26 | Disposition: A | Discharge status: Home / Self Care | Payer: Medicare Other | Source: Ambulatory Visit | Attending: Radiation Oncology | Admitting: Radiation Oncology

## 2020-11-26 ENCOUNTER — Telehealth: Payer: Self-pay

## 2020-11-26 VITALS — BP 140/78 | HR 76 | Temp 97.8°F | Resp 20 | Ht 69.0 in | Wt 164.4 lb

## 2020-11-26 DIAGNOSIS — J90 Pleural effusion, not elsewhere classified: Secondary | ICD-10-CM | POA: Insufficient documentation

## 2020-11-26 DIAGNOSIS — K219 Gastro-esophageal reflux disease without esophagitis: Secondary | ICD-10-CM | POA: Insufficient documentation

## 2020-11-26 DIAGNOSIS — I712 Thoracic aortic aneurysm, without rupture: Secondary | ICD-10-CM | POA: Diagnosis not present

## 2020-11-26 DIAGNOSIS — I2510 Atherosclerotic heart disease of native coronary artery without angina pectoris: Secondary | ICD-10-CM | POA: Insufficient documentation

## 2020-11-26 DIAGNOSIS — I700 Atherosclerosis of aorta: Secondary | ICD-10-CM | POA: Diagnosis not present

## 2020-11-26 DIAGNOSIS — E7800 Pure hypercholesterolemia, unspecified: Secondary | ICD-10-CM | POA: Insufficient documentation

## 2020-11-26 DIAGNOSIS — Z298 Encounter for other specified prophylactic measures: Secondary | ICD-10-CM | POA: Diagnosis not present

## 2020-11-26 DIAGNOSIS — K449 Diaphragmatic hernia without obstruction or gangrene: Secondary | ICD-10-CM | POA: Diagnosis not present

## 2020-11-26 DIAGNOSIS — Z800 Family history of malignant neoplasm of digestive organs: Secondary | ICD-10-CM | POA: Insufficient documentation

## 2020-11-26 DIAGNOSIS — Z87891 Personal history of nicotine dependence: Secondary | ICD-10-CM | POA: Insufficient documentation

## 2020-11-26 DIAGNOSIS — I509 Heart failure, unspecified: Secondary | ICD-10-CM | POA: Diagnosis not present

## 2020-11-26 DIAGNOSIS — Z8042 Family history of malignant neoplasm of prostate: Secondary | ICD-10-CM | POA: Insufficient documentation

## 2020-11-26 DIAGNOSIS — I4891 Unspecified atrial fibrillation: Secondary | ICD-10-CM | POA: Insufficient documentation

## 2020-11-26 DIAGNOSIS — Z7901 Long term (current) use of anticoagulants: Secondary | ICD-10-CM | POA: Insufficient documentation

## 2020-11-26 DIAGNOSIS — C3431 Malignant neoplasm of lower lobe, right bronchus or lung: Secondary | ICD-10-CM

## 2020-11-26 DIAGNOSIS — Z79899 Other long term (current) drug therapy: Secondary | ICD-10-CM | POA: Diagnosis not present

## 2020-11-26 DIAGNOSIS — M1990 Unspecified osteoarthritis, unspecified site: Secondary | ICD-10-CM | POA: Insufficient documentation

## 2020-11-26 DIAGNOSIS — C3490 Malignant neoplasm of unspecified part of unspecified bronchus or lung: Secondary | ICD-10-CM

## 2020-11-26 NOTE — Telephone Encounter (Signed)
Transmission received and appears patient is in rhythm. No episodes noted on transmission. Patient reports he has experienced increased insomnia and shortness of breath. Patient denies any fluid retention symptoms. Compliant with torsemide. States he does have radiation on his mediastinum coming up and feels like that could be related as to why he is not sleeping well. States he does think pollen as to do with his shortness of breath. Patient reports he will follow up with his PCP. Advised patient if we can help him in any way to please call back. Patient appreciative for call.

## 2020-11-26 NOTE — Telephone Encounter (Signed)
The patient states he is sending a transmission to see if he is back in rhythm. He states he feel like he been having some A-fib. I told him I will send the nurse a note so she can be on the look out for the transmission. He would like for her to call him with the results.

## 2020-11-26 NOTE — Progress Notes (Signed)
Radiation Oncology         [phone]) [phone] ________________________________  Name: Alexander Murray, Dr.        MRN: [id number]  Date of Service: 11/26/2020 DOB: [birth year]  QM:VHQIONGE, Quillian Quince, MD  Curt Bears, MD     REFERRING PHYSICIAN: Curt Bears, MD   DIAGNOSIS: The primary encounter diagnosis was Small cell lung cancer, right lower lobe ([place]). A diagnosis of Malignant neoplasm of unspecified part of unspecified bronchus or lung ([place]) was also pertinent to this visit.   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 male seen at the request of Dr. Julien Nordmann for a diagnosis of small cell carcinoma of the lung.  The patient was originally diagnosed with Stage IB, pT2aN0M0 small cell carcinoma of the right lower lobe for which he underwent right lower lobectomy with Dr. Servando Snare in August 2021. Given the cell type he did also undergo adjuvant chemotherapy with carboplatin and etoposide x4 cycles which was completed on 06/15/2020.  He has been followed in surveillance since, he did undergo repeat CT of the chest on 10/14/2020 which showed interval decrease in small bilateral effusions that had previously been seen as well as interval progression of right hilar and subcarinal adenopathy that could be reactive but it was recommended to repeat this, stable compression fractures at T7 and T11 were seen as well as stigmata of pericardial calcification, thoracic aortic aneurysm and atherosclerotic disease which was stable.  He did undergo a PET scan on 10/31/20 that did show hypermetabolism in the right hilar region that had low-level SUV of 4.2 however blood pool was 2.02.  There was also prominence of the subcarinal and AP window lymph nodes as well as left paratracheal lymph nodes with mild prominence however the metabolic activity of these sites were only 2.6.  He did undergo bronchoscopy with EBUS to sample these nodes on 11/18/2020, Dr. Roxan Hockey performed the procedure following Dr.  Everrett Coombe retirement.  An 11 R lymph node was sampled and consistent with small cell carcinoma.  Given these findings he is seen today to discuss adjuvant radiotherapy.     PREVIOUS RADIATION THERAPY: No   PAST MEDICAL HISTORY:  Past Medical History:  Diagnosis Date  . Allergy   . Arthritis    "right knee and right thumb" (12/19/2014)  . Asthma    pt denies  . Atrial fibrillation ([place]) 2021  . Bronchitis 07/2016   started in December and continued about 2 months  . Central retinal artery occlusion 06/26/2013  . CHF (congestive heart failure) ([place]) 08/24/2020  . Esophageal stricture   . GERD (gastroesophageal reflux disease)   . Hemochromatosis   . Hiatal hernia   . Hypercalcemia   . Hypercholesteremia   . Hypertension   . Internal hemorrhoids   . Lung cancer ([place])   . Osteoarthritis   . Palpitations   . Persistent atrial fibrillation ([place])   . Presence of permanent cardiac pacemaker    hx bradycardia  . Retinal artery occlusion, branch    "right eye"  . Second degree AV block, Mobitz type II   . Skin cancer    right shoulder  . Small cell lung cancer, right lower lobe ([place]) 03/12/2020  . Stroke [facility]) ~ 2011   "right eye"/ partial blindness  . Tubular adenoma of colon        PAST SURGICAL HISTORY: Past Surgical History:  Procedure Laterality Date  . CARDIOVERSION N/A 07/29/2020   Procedure: CARDIOVERSION;  Surgeon: Jerline Pain, MD;  [facility];  Service: Cardiovascular;  Laterality: N/A;  NEEDS RAPID COVID TEST MORNING OF  . CARDIOVERSION N/A 08/19/2020   Procedure: CARDIOVERSION;  Surgeon: Thayer Headings, MD;  Location: [place];  Service: Cardiovascular;  Laterality: N/A;  . CATARACT EXTRACTION, BILATERAL  01/2017  . COLONOSCOPY    . EP IMPLANTABLE DEVICE N/A 12/19/2014   Procedure: Pacemaker Implant;  Surgeon: Deboraha Sprang, MD;  Location: [place] [facility];  Service: Cardiovascular;  Laterality: N/A;  . ESOPHAGOGASTRODUODENOSCOPY (EGD)  WITH ESOPHAGEAL DILATION  X 3  . EYE SURGERY Bilateral 2017  . INGUINAL HERNIA REPAIR Left 1980's  . INSERT / REPLACE / REMOVE PACEMAKER  12/19/2014  . INTERCOSTAL NERVE BLOCK Right 03/11/2020   Procedure: INTERCOSTAL NERVE BLOCK;  Surgeon: Grace Isaac, MD;  Location: [place];  Service: Thoracic;  Laterality: Right;  . JOINT REPLACEMENT Right 2016  . KNEE ARTHROSCOPY Right ~ 1982; ~ 1992  . MOHS SURGERY Right ~ 2014   "pre-melanoma scapula"  . POSTERIOR TIBIAL TENDON REPAIR Left 2012  . TONSILLECTOMY  1946  . TOTAL KNEE ARTHROPLASTY Right 05/20/2015   Procedure: RIGHT TOTAL KNEE ARTHROPLASTY;  Surgeon: Paralee Cancel, MD;  Location: [facility];  Service: Orthopedics;  Laterality: Right;  . UPPER GASTROINTESTINAL ENDOSCOPY    . VIDEO BRONCHOSCOPY N/A 03/11/2020   Procedure: VIDEO BRONCHOSCOPY;  Surgeon: Grace Isaac, MD;  Location: [place];  Service: Thoracic;  Laterality: N/A;  . VIDEO BRONCHOSCOPY WITH ENDOBRONCHIAL ULTRASOUND N/A 11/18/2020   Procedure: VIDEO BRONCHOSCOPY WITH ENDOBRONCHIAL ULTRASOUND;  Surgeon: Melrose Nakayama, MD;  Location: [facility];  Service: Thoracic;  Laterality: N/A;  . XI ROBOTIC ASSISTED THORACOSCOPY- SEGMENTECTOMY Right 03/11/2020   Procedure: XI ROBOTIC ASSISTED THORACOSCOPY-RIGHT SUPERIOR SEGMENTECTOMY WITH NODE SAMPLES;  Surgeon: Grace Isaac, MD;  Location: [place];  Service: Thoracic;  Laterality: Right;     FAMILY HISTORY:  Family History  Problem Relation Age of Onset  . Colon cancer Mother        dx in her 10s  . Heart disease Mother   . Alzheimer's disease Mother   . Colon cancer Father        dx in his early 4s  . Heart disease Father   . Prostate cancer Father   . Prostate cancer Brother        1/2 brother  . Hemochromatosis Brother   . Prostate cancer Brother   . Esophageal cancer Neg Hx   . Pancreatic cancer Neg Hx   . Stomach cancer Neg Hx   . Liver disease Neg Hx      SOCIAL HISTORY:  reports that he quit smoking about 58  years ago. His smoking use included cigarettes. He quit after 3.00 years of use. He has never used smokeless tobacco. He reports that he does not drink alcohol and does not use drugs.  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  He is accompanied by his son who is also a radiologist.   ALLERGIES: Lisinopril   MEDICATIONS:  Current Outpatient Medications  Medication Sig Dispense Refill  . acetaminophen (TYLENOL) 500 MG tablet Take 500-1,000 mg by mouth every 6 (six) hours as needed for moderate pain.    Marland Kitchen atorvastatin (LIPITOR) 20 MG tablet TAKE 1 TABLET(20 MG) BY MOUTH DAILY 90 tablet 0  . celecoxib (CELEBREX) 200 MG capsule Take 1 capsule (200 mg total) by mouth 2 (two) times daily as needed for mild pain. 10 capsule 0  .  COVID-19 mRNA Vac-TriS, Pfizer, (PFIZER-BIONT COVID-19 VAC-TRIS) SUSP injection Inject into the muscle. 0.3 mL 0  . dofetilide (TIKOSYN) 250 MCG capsule Take 1 capsule (250 mcg total) by mouth 2 (two) times daily. 60 capsule 6  . doxazosin (CARDURA) 4 MG tablet Take 4 mg by mouth daily.     Marland Kitchen esomeprazole (NEXIUM) 20 MG capsule Take 20 mg by mouth daily before breakfast.    . fluticasone (FLONASE) 50 MCG/ACT nasal spray Place 1 spray into both nostrils daily.    . Glycerin-Hypromellose-PEG 400 (DRY EYE RELIEF DROPS) 0.2-0.2-1 % SOLN Place 1 drop into both eyes daily as needed (for dryness). Saline    . HYDROcodone-homatropine (HYCODAN) 5-1.5 MG/5ML syrup Take 5 mLs by mouth every 6 (six) hours as needed for cough.    . metoprolol succinate (TOPROL-XL) 25 MG 24 hr tablet TAKE 1/2 TABLET (12.5 MG TOTAL) BY MOUTH AT BEDTIME. (Patient taking differently: Take 12.5 mg by mouth at bedtime.) 30 tablet 3  . montelukast (SINGULAIR) 10 MG tablet Take 10 mg by mouth every morning.     . Multiple Vitamins-Minerals (PRESERVISION AREDS 2 PO) Take 1 capsule by mouth in the morning and at bedtime.     . potassium chloride SA (KLOR-CON) 20 MEQ tablet Take 74meq  (4 tablets by mouth) on Monday, Wednesday and Friday - the days you take your diuretic, Torsemide.  Take 60meq (1 tablet by mouth) on the days you do not take your diuretic. (Patient taking differently: Take 20-80 mEq by mouth See admin instructions. Take 80 meq by mouth on Monday, Wednesday and Friday and 20 meq on Tuesday, Thursday, Saturday and Sunday) 90 tablet 3  . rivaroxaban (XARELTO) 20 MG TABS tablet Take 1 tablet (20 mg total) by mouth daily with supper. 30 tablet 11  . torsemide (DEMADEX) 20 MG tablet Monday, Wednesday and Friday you will take 40mg  ( 2 tablets by mouth) (Patient taking differently: Take 20 mg by mouth daily.) 60 tablet 2  . vitamin C (ASCORBIC ACID) 500 MG tablet Take 500 mg by mouth at bedtime.      No current facility-administered medications for this encounter.     REVIEW OF SYSTEMS: On review of systems, the patient reports that he is doing pretty well overall. He does have a history of atrial fibrillation and recent CHF findings but he is taking diuretic therapy regularly and his lower extremities do not seem to be any different as far as edema, he describes 1+ edema that is his baseline. He does have some increased labored breathing/shortness of breath since his biopsy as well as some hoarseness. He's not had any effusion seen in the lungs on post biopsy CXR on 11/15/20. He is going to reach out to Dr. Caryl Comes however. He denies any hemoptysis. No other complaints are verbalized.    PHYSICAL EXAM:  Wt Readings from Last 3 Encounters:  11/26/20 164 lb 6.4 oz (74.6 kg)  11/20/20 162 lb 8 oz (73.7 kg)  11/18/20 155 lb (70.3 kg)   Temp Readings from Last 3 Encounters:  11/26/20 97.8 F (36.6 C)  11/20/20 98.2 F (36.8 C) (Tympanic)  11/18/20 97.6 F (36.4 C)   BP Readings from Last 3 Encounters:  11/26/20 140/78  11/20/20 120/70  11/18/20 115/74   Pulse Readings from Last 3 Encounters:  11/26/20 76  11/20/20 89  11/18/20 63   Pain Assessment Pain Score:  0-No pain/10  In general this is a well appearing caucasian male in no acute distress. He's  alert and oriented x4 and appropriate throughout the examination. Cardiopulmonary assessment is negative for acute distress and he exhibits normal effort.    ECOG = 0  0 - Asymptomatic (Fully active, able to carry on all predisease activities without restriction)  1 - Symptomatic but completely ambulatory (Restricted in physically strenuous activity but ambulatory and able to carry out work of a light or sedentary nature. For example, light housework, office work)  2 - Symptomatic, <50% in bed during the day (Ambulatory and capable of all self care but unable to carry out any work activities. Up and about more than 50% of waking hours)  3 - Symptomatic, >50% in bed, but not bedbound (Capable of only limited self-care, confined to bed or chair 50% or more of waking hours)  4 - Bedbound (Completely disabled. Cannot carry on any self-care. Totally confined to bed or chair)  5 - Death   Eustace Pen MM, Creech RH, Tormey DC, et al. [phone]). "Toxicity and response criteria of the [facility]". [place] Oncol. 5 (6): 649-55    LABORATORY DATA:  Lab Results  Component Value Date   WBC 5.4 11/15/2020   HGB 11.5 (L) 11/15/2020   HCT 35.2 (L) 11/15/2020   MCV 93.6 11/15/2020   PLT 121 (L) 11/15/2020   Lab Results  Component Value Date   NA 140 11/15/2020   K 4.3 11/15/2020   CL 105 11/15/2020   CO2 27 11/15/2020   Lab Results  Component Value Date   ALT 18 11/15/2020   AST 28 11/15/2020   ALKPHOS 308 (H) 11/15/2020   BILITOT 2.6 (H) 11/15/2020      RADIOGRAPHY: DG Chest 2 View  Result Date: 11/15/2020 CLINICAL DATA:  79 year old male with no current chest complaints, upcoming bronchoscopy and biopsy EXAM: CHEST - 2 VIEW COMPARISON:  08/24/2020, prior CT 10/14/2020 FINDINGS: Cardiomediastinal silhouette unchanged in size and contour, with pericardial calcifications.  Similar appearance of asymmetric elevation of the right hemidiaphragm. Unchanged left chest wall cardiac pacing device. Coarsened interstitial markings of the bilateral lungs. No interlobular septal thickening. No pneumothorax or pleural effusion. No new confluent airspace disease. IMPRESSION: Chronic lung changes without evidence of acute cardiopulmonary disease. Redemonstration of pericardial calcifications. Electronically Signed   By: Corrie Mckusick D.O.   On: 11/15/2020 16:11   NM PET Image Restage (PS) Skull Base to Thigh  Result Date: 11/01/2020 CLINICAL DATA:  Subsequent treatment strategy for 80 year old male with history of small cell lung cancer post partial lung resection of the superior segment of the RIGHT lower lobe in 2021 post 4 cycles of systemic therapy including Neulasta. Recent chest CT suspicious for disease worsening. EXAM: NUCLEAR MEDICINE PET SKULL BASE TO THIGH TECHNIQUE: 7.8 mCi F-18 FDG was injected intravenously. Full-ring PET imaging was performed from the skull base to thigh after the radiotracer. CT data was obtained and used for attenuation correction and anatomic localization. Fasting blood glucose: 96 mg/dl COMPARISON:  Prior PET CT from 2021 and most recent chest CT of October 14, 2020 and intervening examinations available for comparison. FINDINGS: Mediastinal blood pool activity: SUV max 2.02 Liver activity: SUV max NA NECK: No hypermetabolic lymph nodes in the neck. Incidental CT findings: none CHEST: Mild fullness of the RIGHT hilum with similar appearance to recent chest CT showing a maximum SUV of 4.2. Subcarinal lymph nodes and AP window lymph nodes/LEFT paratracheal lymph nodes with mild prominence displaying metabolic activity only slightly greater than blood pool activity at approximately 2.6. Subcarinal  and LEFT paratracheal lymph nodes are less than a cm and unchanged from recent imaging. No suspicious pulmonary nodule, parenchymal assessment is limited by respiratory  motion in there are changes in the RIGHT chest related to superior segment resection in the RIGHT lower lobe. Incidental CT findings: Calcified atheromatous plaque of the thoracic aorta with stable aneurysmal dilation of the ascending thoracic aorta approximately 4.2 cm. Peripheral pericardial calcification is quite dense about the heart. No visible pericardial effusion. Dual lead pacer device remains in place. Central pulmonary vasculature stable caliber. Limited assessment of cardiovascular structures given lack of intravenous contrast. Signs of septal thickening in the chest appear worse than on previous imaging, associated with ground-glass, slight increase in bilateral pleural effusions layering dependently in the LEFT and RIGHT chest compared to previous evaluation from October 14, 2020. Esophagus grossly normal. ABDOMEN/PELVIS: No abnormal hypermetabolic activity within the liver, pancreas, adrenal glands, or spleen. No hypermetabolic lymph nodes in the abdomen or pelvis. Incidental CT findings: Liver, gallbladder, spleen, pancreas and adrenal glands without acute process. Cyst in the upper pole the LEFT kidney as before. No hydronephrosis. Smooth contour of the urinary bladder. No acute gastrointestinal process. Normal appendix. Aortic atherosclerosis with 2.6 cm infrarenal abdominal aorta.  The SKELETON: No focal hypermetabolic activity to suggest skeletal metastasis. Incidental CT findings: Thoracic spine compression fracture at the T11 level with similar appearance, associated with kyphosis. Degenerative changes in the cervical spine associated with mild FDG uptake on the RIGHT IMPRESSION: 1. RIGHT hilar lymph node mildly suspicious for involvement given low level activity, greater than blood pool and background liver. Reactive changes are also considered. 2. Other lymph nodes in the chest without significant metabolic activity, close attention on follow-up is suggested. 3. Increased septal thickening,  ground-glass and effusions, correlate with any respiratory symptoms or evidence of heart failure or volume overload. Effusions are slightly larger than the recent chest CT but remains smaller than the study of November of 2021 4. Pericardial calcifications as before, correlate with any signs of restrictive physiology. 5. Calcified aortic atherosclerosis and aneurysmal disease as on prior imaging, refer to previous reports recommendations. 6. Trace free fluid in the pelvis could be related to volume status. No other signs to indicate acute findings in the abdomen or pelvis. Electronically Signed   By: Zetta Bills M.D.   On: 11/01/2020 10:32       IMPRESSION/PLAN: 1. Progressive Stage IB, pT2aN0M0 small cell carcinoma of the right lower lobe, with local progression in the nodes of the mediastinum. Dr. Lisbeth Renshaw discusses the pathology findings and reviews the nature of early stage small cell carcinomas.  Given the findings and course the patient has experienced, Dr. Lisbeth Renshaw discusses the role of additional treatment with adjuvant radiotherapy to the regional lymph nodes of the chest. We discussed the risks, benefits, short, and long term effects of radiotherapy, as well as the curative intent, and the patient is interested in proceeding. Dr. Lisbeth Renshaw discusses the delivery and logistics of radiotherapy and anticipates a course of 6 1/2 weeks of radiotherapy to the nodes of the chest. Written consent is obtained and placed in the chart, a copy was provided to the patient. He will simulate on Thursday morning. Written consent is obtained and placed in the chart, a copy was provided to the patient.  2. ICD and symptoms possibly related to CHF. The patient's symptoms and weight change that was recorded could be related to his CHF. He will also reach out to Dr. Olin Pia clinic to consider another  remote pacer check. We will reach out to Dr. Caryl Comes related to permission to treat his chest given his in situ ICD. He is also in  need of an MRI brain to determine the role for prophylactic cranial irradiation versus treatment dosing to the brain given his histology and given his ICD may need closer monitoring of his device in order to have the MRI.  3. Prophylactic Cranial Irradiation. As per #2, we will proceed with an MRI to determine if he has disease or not, and if not, he would be offered prophylactic brain radiation to reduce risks of CNS disease (as high as 60% risk,  down to 20% risk). He may be interested in proceeding with this at the conclusion of his chest treatment. We will follow up with the results of his MRI first prior to coordinating. We also discussed the risks, benefits, short, and long term effects of radiation to the brain including the role of Namenda to reduce changes in cognition per NCCN guidelines. He will consider all of this and we will follow up with him regarding his decision for radiation to the brain.   In a visit lasting 60 minutes, greater than 50% of the time was spent face to face with Dr. Lisbeth Renshaw, and myself (on Webex remotely) discussing the patient's condition, in preparation for the discussion, and coordinating the patient's care.    The above documentation reflects my direct findings during this shared patient visit. Please see the separate note by Dr. Lisbeth Renshaw on this date for the remainder of the patient's plan of care.    Carola Rhine, [facility]   **Disclaimer: This note was dictated with voice recognition software. Similar sounding words can inadvertently be transcribed and this note may contain transcription errors which may not have been corrected upon publication of note.**

## 2020-11-28 ENCOUNTER — Other Ambulatory Visit (HOSPITAL_COMMUNITY): Payer: Self-pay | Admitting: Physician Assistant

## 2020-11-28 ENCOUNTER — Other Ambulatory Visit: Payer: Self-pay

## 2020-11-28 ENCOUNTER — Ambulatory Visit
Admission: RE | Admit: 2020-11-28 | Discharge: 2020-11-28 | Disposition: A | Discharge status: Home / Self Care | Payer: Medicare Other | Source: Ambulatory Visit | Attending: Radiation Oncology | Admitting: Radiation Oncology

## 2020-11-28 DIAGNOSIS — C3431 Malignant neoplasm of lower lobe, right bronchus or lung: Secondary | ICD-10-CM | POA: Diagnosis not present

## 2020-11-28 DIAGNOSIS — Z510 Encounter for antineoplastic radiation therapy: Secondary | ICD-10-CM | POA: Diagnosis not present

## 2020-11-28 DIAGNOSIS — Z87891 Personal history of nicotine dependence: Secondary | ICD-10-CM | POA: Diagnosis not present

## 2020-12-02 DIAGNOSIS — Z510 Encounter for antineoplastic radiation therapy: Secondary | ICD-10-CM | POA: Insufficient documentation

## 2020-12-02 DIAGNOSIS — C3431 Malignant neoplasm of lower lobe, right bronchus or lung: Secondary | ICD-10-CM | POA: Diagnosis not present

## 2020-12-02 DIAGNOSIS — Z87891 Personal history of nicotine dependence: Secondary | ICD-10-CM | POA: Diagnosis not present

## 2020-12-03 ENCOUNTER — Ambulatory Visit
Admission: RE | Admit: 2020-12-03 | Discharge: 2020-12-03 | Disposition: A | Discharge status: Home / Self Care | Payer: Medicare Other | Source: Ambulatory Visit | Attending: Radiation Oncology | Admitting: Radiation Oncology

## 2020-12-03 ENCOUNTER — Other Ambulatory Visit: Payer: Self-pay

## 2020-12-03 DIAGNOSIS — Z87891 Personal history of nicotine dependence: Secondary | ICD-10-CM | POA: Diagnosis not present

## 2020-12-03 DIAGNOSIS — Z510 Encounter for antineoplastic radiation therapy: Secondary | ICD-10-CM | POA: Diagnosis not present

## 2020-12-03 DIAGNOSIS — C3431 Malignant neoplasm of lower lobe, right bronchus or lung: Secondary | ICD-10-CM | POA: Diagnosis not present

## 2020-12-04 ENCOUNTER — Ambulatory Visit
Admission: RE | Admit: 2020-12-04 | Discharge: 2020-12-04 | Disposition: A | Discharge status: Home / Self Care | Payer: Medicare Other | Source: Ambulatory Visit | Attending: Radiation Oncology | Admitting: Radiation Oncology

## 2020-12-04 DIAGNOSIS — Z510 Encounter for antineoplastic radiation therapy: Secondary | ICD-10-CM | POA: Diagnosis not present

## 2020-12-04 DIAGNOSIS — C3431 Malignant neoplasm of lower lobe, right bronchus or lung: Secondary | ICD-10-CM | POA: Diagnosis not present

## 2020-12-04 DIAGNOSIS — Z87891 Personal history of nicotine dependence: Secondary | ICD-10-CM | POA: Diagnosis not present

## 2020-12-05 ENCOUNTER — Ambulatory Visit
Admission: RE | Admit: 2020-12-05 | Discharge: 2020-12-05 | Disposition: A | Discharge status: Home / Self Care | Payer: Medicare Other | Source: Ambulatory Visit | Attending: Radiation Oncology | Admitting: Radiation Oncology

## 2020-12-05 ENCOUNTER — Other Ambulatory Visit: Payer: Self-pay

## 2020-12-05 DIAGNOSIS — C3431 Malignant neoplasm of lower lobe, right bronchus or lung: Secondary | ICD-10-CM | POA: Diagnosis not present

## 2020-12-05 DIAGNOSIS — Z510 Encounter for antineoplastic radiation therapy: Secondary | ICD-10-CM | POA: Diagnosis not present

## 2020-12-05 DIAGNOSIS — Z87891 Personal history of nicotine dependence: Secondary | ICD-10-CM | POA: Diagnosis not present

## 2020-12-05 NOTE — Progress Notes (Signed)
Pt here for patient teaching.  Pt given Radiation and You booklet, skin care instructions and Sonafine.  Reviewed areas of pertinence such as fatigue, hair loss, skin changes, throat changes, cough and shortness of breath . Pt able to give teach back of to pat skin and use unscented/gentle soap,apply Sonafine bid and avoid applying anything to skin within 4 hours of treatment. Pt verbalizes understanding of information given and will contact nursing with any questions or concerns.     [email or web address].org/treatmentinformation/whattoexpect/index  Gloriajean Dell. Leonie Green, BSN

## 2020-12-06 ENCOUNTER — Ambulatory Visit
Admission: RE | Admit: 2020-12-06 | Discharge: 2020-12-06 | Disposition: A | Discharge status: Home / Self Care | Payer: Medicare Other | Source: Ambulatory Visit | Attending: Radiation Oncology | Admitting: Radiation Oncology

## 2020-12-06 DIAGNOSIS — Z510 Encounter for antineoplastic radiation therapy: Secondary | ICD-10-CM | POA: Diagnosis not present

## 2020-12-06 DIAGNOSIS — C3431 Malignant neoplasm of lower lobe, right bronchus or lung: Secondary | ICD-10-CM | POA: Diagnosis not present

## 2020-12-06 DIAGNOSIS — Z87891 Personal history of nicotine dependence: Secondary | ICD-10-CM | POA: Diagnosis not present

## 2020-12-06 MED ORDER — SONAFINE EX EMUL
1.0000 | Freq: Once | CUTANEOUS | Status: AC
Start: 2020-12-06 — End: 2020-12-06
  Administered 2020-12-06: 1 via TOPICAL

## 2020-12-09 ENCOUNTER — Other Ambulatory Visit: Payer: Self-pay

## 2020-12-09 ENCOUNTER — Ambulatory Visit
Admission: RE | Admit: 2020-12-09 | Discharge: 2020-12-09 | Disposition: A | Discharge status: Home / Self Care | Payer: Medicare Other | Source: Ambulatory Visit | Attending: Radiation Oncology | Admitting: Radiation Oncology

## 2020-12-09 DIAGNOSIS — C3431 Malignant neoplasm of lower lobe, right bronchus or lung: Secondary | ICD-10-CM | POA: Diagnosis not present

## 2020-12-09 DIAGNOSIS — Z510 Encounter for antineoplastic radiation therapy: Secondary | ICD-10-CM | POA: Diagnosis not present

## 2020-12-09 DIAGNOSIS — Z87891 Personal history of nicotine dependence: Secondary | ICD-10-CM | POA: Diagnosis not present

## 2020-12-10 ENCOUNTER — Ambulatory Visit
Admission: RE | Admit: 2020-12-10 | Discharge: 2020-12-10 | Disposition: A | Discharge status: Home / Self Care | Payer: Medicare Other | Source: Ambulatory Visit | Attending: Radiation Oncology | Admitting: Radiation Oncology

## 2020-12-10 DIAGNOSIS — Z510 Encounter for antineoplastic radiation therapy: Secondary | ICD-10-CM | POA: Diagnosis not present

## 2020-12-10 DIAGNOSIS — C3431 Malignant neoplasm of lower lobe, right bronchus or lung: Secondary | ICD-10-CM | POA: Diagnosis not present

## 2020-12-10 DIAGNOSIS — Z87891 Personal history of nicotine dependence: Secondary | ICD-10-CM | POA: Diagnosis not present

## 2020-12-11 ENCOUNTER — Other Ambulatory Visit: Payer: Self-pay

## 2020-12-11 ENCOUNTER — Ambulatory Visit
Admission: RE | Admit: 2020-12-11 | Discharge: 2020-12-11 | Disposition: A | Discharge status: Home / Self Care | Payer: Medicare Other | Source: Ambulatory Visit | Attending: Radiation Oncology | Admitting: Radiation Oncology

## 2020-12-11 DIAGNOSIS — C3431 Malignant neoplasm of lower lobe, right bronchus or lung: Secondary | ICD-10-CM | POA: Diagnosis not present

## 2020-12-11 DIAGNOSIS — Z510 Encounter for antineoplastic radiation therapy: Secondary | ICD-10-CM | POA: Diagnosis not present

## 2020-12-11 DIAGNOSIS — Z87891 Personal history of nicotine dependence: Secondary | ICD-10-CM | POA: Diagnosis not present

## 2020-12-12 ENCOUNTER — Ambulatory Visit
Admission: RE | Admit: 2020-12-12 | Discharge: 2020-12-12 | Disposition: A | Discharge status: Home / Self Care | Payer: Medicare Other | Source: Ambulatory Visit | Attending: Radiation Oncology | Admitting: Radiation Oncology

## 2020-12-12 DIAGNOSIS — Z510 Encounter for antineoplastic radiation therapy: Secondary | ICD-10-CM | POA: Diagnosis not present

## 2020-12-12 DIAGNOSIS — C3431 Malignant neoplasm of lower lobe, right bronchus or lung: Secondary | ICD-10-CM | POA: Diagnosis not present

## 2020-12-12 DIAGNOSIS — Z87891 Personal history of nicotine dependence: Secondary | ICD-10-CM | POA: Diagnosis not present

## 2020-12-13 ENCOUNTER — Ambulatory Visit
Admission: RE | Admit: 2020-12-13 | Discharge: 2020-12-13 | Disposition: A | Discharge status: Home / Self Care | Payer: Medicare Other | Source: Ambulatory Visit | Attending: Radiation Oncology | Admitting: Radiation Oncology

## 2020-12-13 ENCOUNTER — Other Ambulatory Visit: Payer: Self-pay

## 2020-12-13 DIAGNOSIS — C3431 Malignant neoplasm of lower lobe, right bronchus or lung: Secondary | ICD-10-CM | POA: Diagnosis not present

## 2020-12-13 DIAGNOSIS — Z510 Encounter for antineoplastic radiation therapy: Secondary | ICD-10-CM | POA: Diagnosis not present

## 2020-12-13 DIAGNOSIS — Z87891 Personal history of nicotine dependence: Secondary | ICD-10-CM | POA: Diagnosis not present

## 2020-12-16 ENCOUNTER — Ambulatory Visit
Admission: RE | Admit: 2020-12-16 | Discharge: 2020-12-16 | Disposition: A | Discharge status: Home / Self Care | Payer: Medicare Other | Source: Ambulatory Visit | Attending: Radiation Oncology | Admitting: Radiation Oncology

## 2020-12-16 DIAGNOSIS — C3431 Malignant neoplasm of lower lobe, right bronchus or lung: Secondary | ICD-10-CM | POA: Diagnosis not present

## 2020-12-16 DIAGNOSIS — Z87891 Personal history of nicotine dependence: Secondary | ICD-10-CM | POA: Diagnosis not present

## 2020-12-16 DIAGNOSIS — Z510 Encounter for antineoplastic radiation therapy: Secondary | ICD-10-CM | POA: Diagnosis not present

## 2020-12-17 ENCOUNTER — Ambulatory Visit
Admission: RE | Admit: 2020-12-17 | Discharge: 2020-12-17 | Disposition: A | Discharge status: Home / Self Care | Payer: Medicare Other | Source: Ambulatory Visit | Attending: Radiation Oncology | Admitting: Radiation Oncology

## 2020-12-17 ENCOUNTER — Other Ambulatory Visit: Payer: Self-pay

## 2020-12-17 DIAGNOSIS — C3431 Malignant neoplasm of lower lobe, right bronchus or lung: Secondary | ICD-10-CM | POA: Diagnosis not present

## 2020-12-17 DIAGNOSIS — Z87891 Personal history of nicotine dependence: Secondary | ICD-10-CM | POA: Diagnosis not present

## 2020-12-17 DIAGNOSIS — Z510 Encounter for antineoplastic radiation therapy: Secondary | ICD-10-CM | POA: Diagnosis not present

## 2020-12-18 ENCOUNTER — Ambulatory Visit
Admission: RE | Admit: 2020-12-18 | Discharge: 2020-12-18 | Disposition: A | Discharge status: Home / Self Care | Payer: Medicare Other | Source: Ambulatory Visit | Attending: Radiation Oncology | Admitting: Radiation Oncology

## 2020-12-18 DIAGNOSIS — C3431 Malignant neoplasm of lower lobe, right bronchus or lung: Secondary | ICD-10-CM | POA: Diagnosis not present

## 2020-12-18 DIAGNOSIS — Z87891 Personal history of nicotine dependence: Secondary | ICD-10-CM | POA: Diagnosis not present

## 2020-12-18 DIAGNOSIS — Z510 Encounter for antineoplastic radiation therapy: Secondary | ICD-10-CM | POA: Diagnosis not present

## 2020-12-19 ENCOUNTER — Ambulatory Visit
Admission: RE | Admit: 2020-12-19 | Discharge: 2020-12-19 | Disposition: A | Discharge status: Home / Self Care | Payer: Medicare Other | Source: Ambulatory Visit | Attending: Radiation Oncology | Admitting: Radiation Oncology

## 2020-12-19 ENCOUNTER — Other Ambulatory Visit: Payer: Self-pay

## 2020-12-19 DIAGNOSIS — Z87891 Personal history of nicotine dependence: Secondary | ICD-10-CM | POA: Diagnosis not present

## 2020-12-19 DIAGNOSIS — C3431 Malignant neoplasm of lower lobe, right bronchus or lung: Secondary | ICD-10-CM | POA: Diagnosis not present

## 2020-12-19 DIAGNOSIS — Z510 Encounter for antineoplastic radiation therapy: Secondary | ICD-10-CM | POA: Diagnosis not present

## 2020-12-20 ENCOUNTER — Ambulatory Visit
Admission: RE | Admit: 2020-12-20 | Discharge: 2020-12-20 | Disposition: A | Discharge status: Home / Self Care | Payer: Medicare Other | Source: Ambulatory Visit | Attending: Radiation Oncology | Admitting: Radiation Oncology

## 2020-12-20 DIAGNOSIS — Z87891 Personal history of nicotine dependence: Secondary | ICD-10-CM | POA: Diagnosis not present

## 2020-12-20 DIAGNOSIS — Z510 Encounter for antineoplastic radiation therapy: Secondary | ICD-10-CM | POA: Diagnosis not present

## 2020-12-20 DIAGNOSIS — C3431 Malignant neoplasm of lower lobe, right bronchus or lung: Secondary | ICD-10-CM | POA: Diagnosis not present

## 2020-12-21 ENCOUNTER — Other Ambulatory Visit (HOSPITAL_COMMUNITY): Payer: Self-pay | Admitting: Physician Assistant

## 2020-12-23 ENCOUNTER — Ambulatory Visit
Admission: RE | Admit: 2020-12-23 | Discharge: 2020-12-23 | Disposition: A | Discharge status: Home / Self Care | Payer: Medicare Other | Source: Ambulatory Visit | Attending: Radiation Oncology | Admitting: Radiation Oncology

## 2020-12-23 ENCOUNTER — Ambulatory Visit (INDEPENDENT_AMBULATORY_CARE_PROVIDER_SITE_OTHER): Payer: Medicare Other

## 2020-12-23 DIAGNOSIS — Z510 Encounter for antineoplastic radiation therapy: Secondary | ICD-10-CM | POA: Diagnosis not present

## 2020-12-23 DIAGNOSIS — C3431 Malignant neoplasm of lower lobe, right bronchus or lung: Secondary | ICD-10-CM | POA: Diagnosis not present

## 2020-12-23 DIAGNOSIS — I442 Atrioventricular block, complete: Secondary | ICD-10-CM

## 2020-12-23 DIAGNOSIS — Z87891 Personal history of nicotine dependence: Secondary | ICD-10-CM | POA: Diagnosis not present

## 2020-12-24 ENCOUNTER — Other Ambulatory Visit: Payer: Self-pay

## 2020-12-24 ENCOUNTER — Ambulatory Visit
Admission: RE | Admit: 2020-12-24 | Discharge: 2020-12-24 | Disposition: A | Discharge status: Home / Self Care | Payer: Medicare Other | Source: Ambulatory Visit | Attending: Radiation Oncology | Admitting: Radiation Oncology

## 2020-12-24 DIAGNOSIS — C3431 Malignant neoplasm of lower lobe, right bronchus or lung: Secondary | ICD-10-CM | POA: Diagnosis not present

## 2020-12-24 DIAGNOSIS — Z510 Encounter for antineoplastic radiation therapy: Secondary | ICD-10-CM | POA: Diagnosis not present

## 2020-12-24 DIAGNOSIS — Z87891 Personal history of nicotine dependence: Secondary | ICD-10-CM | POA: Diagnosis not present

## 2020-12-24 LAB — CUP PACEART REMOTE DEVICE CHECK
Battery Remaining Longevity: 39 mo
Battery Voltage: 2.96 V
Brady Statistic AP VP Percent: 17.13 %
Brady Statistic AP VS Percent: 0 %
Brady Statistic AS VP Percent: 82.84 %
Brady Statistic AS VS Percent: 0.02 %
Brady Statistic RA Percent Paced: 17.12 %
Brady Statistic RV Percent Paced: 99.97 %
Date Time Interrogation Session: 20220523081120
Implantable Lead Implant Date: 20160518
Implantable Lead Implant Date: 20160518
Implantable Lead Location: 753859
Implantable Lead Location: 753860
Implantable Lead Model: 5076
Implantable Lead Model: 5076
Implantable Pulse Generator Implant Date: 20160518
Lead Channel Impedance Value: 342 Ohm
Lead Channel Impedance Value: 380 Ohm
Lead Channel Impedance Value: 456 Ohm
Lead Channel Impedance Value: 475 Ohm
Lead Channel Pacing Threshold Amplitude: 0.5 V
Lead Channel Pacing Threshold Amplitude: 0.75 V
Lead Channel Pacing Threshold Pulse Width: 0.4 ms
Lead Channel Pacing Threshold Pulse Width: 0.4 ms
Lead Channel Sensing Intrinsic Amplitude: 3.875 mV
Lead Channel Sensing Intrinsic Amplitude: 3.875 mV
Lead Channel Sensing Intrinsic Amplitude: 4.625 mV
Lead Channel Sensing Intrinsic Amplitude: 4.625 mV
Lead Channel Setting Pacing Amplitude: 1.5 V
Lead Channel Setting Pacing Amplitude: 2.5 V
Lead Channel Setting Pacing Pulse Width: 0.4 ms
Lead Channel Setting Sensing Sensitivity: 0.9 mV

## 2020-12-25 ENCOUNTER — Ambulatory Visit
Admission: RE | Admit: 2020-12-25 | Discharge: 2020-12-25 | Disposition: A | Discharge status: Home / Self Care | Payer: Medicare Other | Source: Ambulatory Visit | Attending: Radiation Oncology | Admitting: Radiation Oncology

## 2020-12-25 ENCOUNTER — Ambulatory Visit (HOSPITAL_COMMUNITY)
Admission: RE | Admit: 2020-12-25 | Discharge: 2020-12-25 | Disposition: A | Discharge status: Home / Self Care | Payer: Medicare Other | Source: Ambulatory Visit | Attending: Radiation Oncology | Admitting: Radiation Oncology

## 2020-12-25 ENCOUNTER — Other Ambulatory Visit: Payer: Self-pay

## 2020-12-25 DIAGNOSIS — C3490 Malignant neoplasm of unspecified part of unspecified bronchus or lung: Secondary | ICD-10-CM | POA: Insufficient documentation

## 2020-12-25 DIAGNOSIS — Z87891 Personal history of nicotine dependence: Secondary | ICD-10-CM | POA: Diagnosis not present

## 2020-12-25 DIAGNOSIS — Z510 Encounter for antineoplastic radiation therapy: Secondary | ICD-10-CM | POA: Diagnosis not present

## 2020-12-25 DIAGNOSIS — G319 Degenerative disease of nervous system, unspecified: Secondary | ICD-10-CM | POA: Diagnosis not present

## 2020-12-25 DIAGNOSIS — C3431 Malignant neoplasm of lower lobe, right bronchus or lung: Secondary | ICD-10-CM | POA: Diagnosis not present

## 2020-12-25 IMAGING — MR MR HEAD WO/W CM
14 of 16 series · 40 of 48 positions shown · IV contrast (gadavist)
Comparison: MRI head [DATE]

CLINICAL DATA: Small cell lung cancer staging.

EXAM:
MRI HEAD WITHOUT AND WITH CONTRAST
TECHNIQUE: Multiplanar, multiecho pulse sequences of the brain and surrounding
structures were obtained without and with intravenous contrast.
CONTRAST:  7mL GADAVIST GADOBUTROL 1 MMOL/ML IV SOLN

[Series 5: DWI · axial · 3.0mm · 0.88mm/px · z∈[-91,+61]mm · 7 of 104 slices shown (1 of 4)]
[im 1/104]
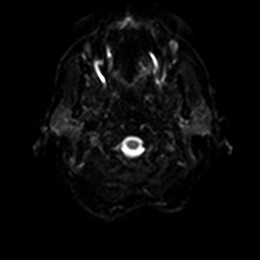
[im 18/104]
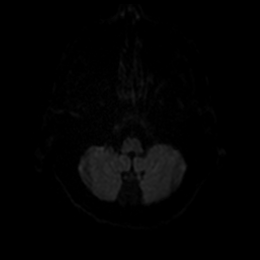
[im 35/104]
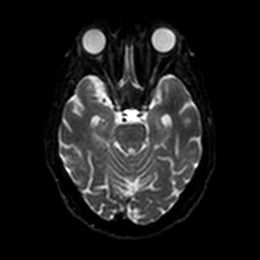
[im 52/104]
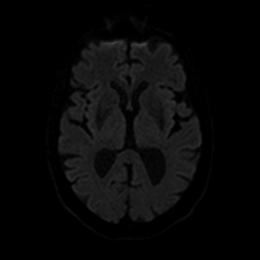
[im 69/104]
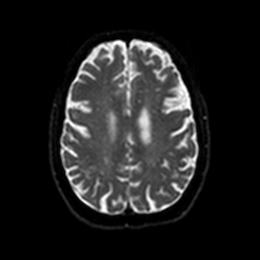
[im 86/104]
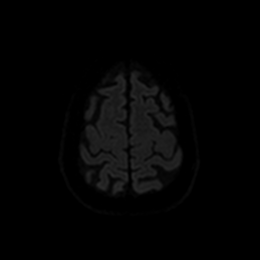
[im 104/104]
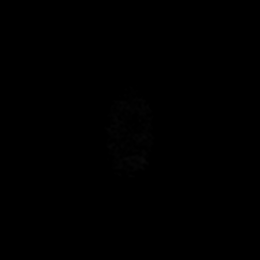

[Series 6: DWI · axial · 3.0mm · 0.88mm/px · z∈[-91,+61]mm · 3 of 52 slices shown (2 of 4)]
[im 1/52]
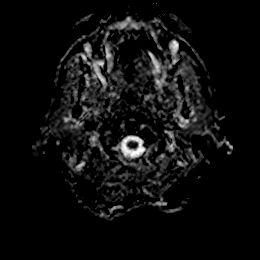
[im 26/52]
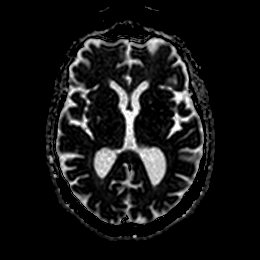
[im 52/52]
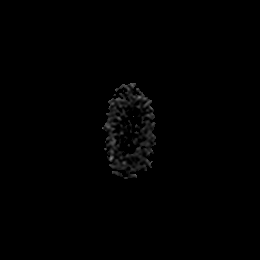

[Series 7: DWI · coronal · 4.0mm · 0.88mm/px · 4 of 64 slices shown (3 of 4)]
[im 1/64]
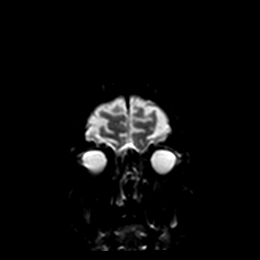
[im 22/64]
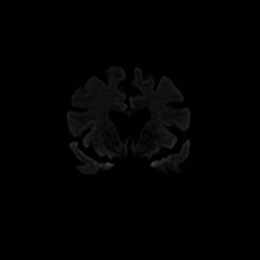
[im 43/64]
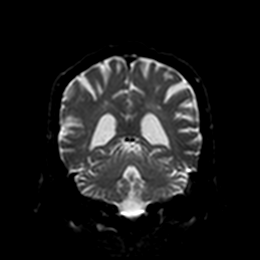
[im 64/64]
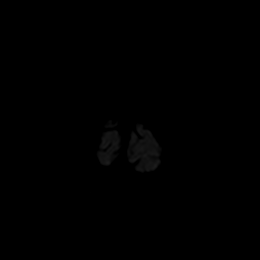

[Series 8: DWI · coronal · 4.0mm · 0.88mm/px · 2 of 32 slices shown (4 of 4)]
[im 1/32]
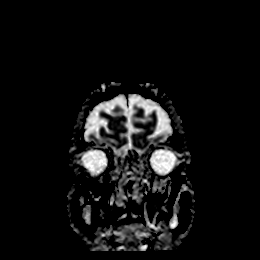
[im 32/32]
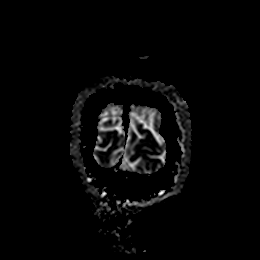

[Series 9: T1 · sagittal · 5.0mm · 0.75mm/px · 1 of 23 slices shown]
[im 1/23]
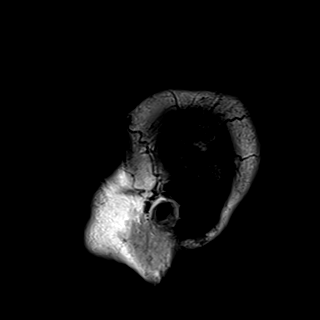

[Series 10: T2 · axial · 5.0mm · 0.72mm/px · z∈[-93,+68]mm · 2 of 28 slices shown]
[im 1/28]
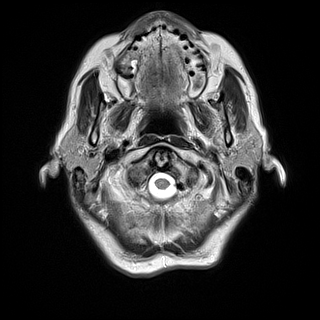
[im 28/28]
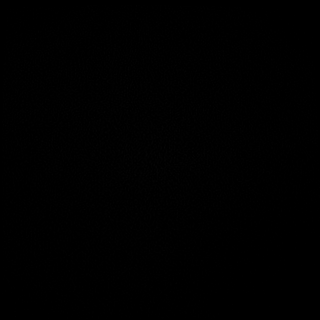

[Series 11: FLAIR · axial · 5.0mm · 0.45mm/px · z∈[-94,+67]mm · 2 of 28 slices shown]
[im 1/28]
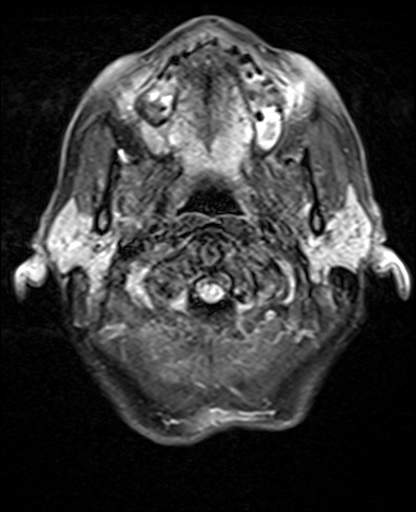
[im 28/28]
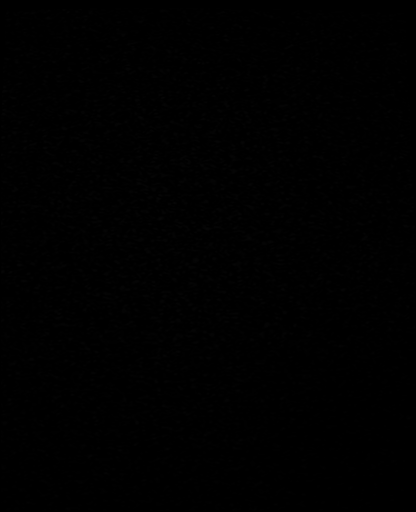

[Series 12: mag_images · axial · 3.0mm · 0.90mm/px · z∈[-101,+74]mm · 4 of 60 slices shown]
[im 1/60]
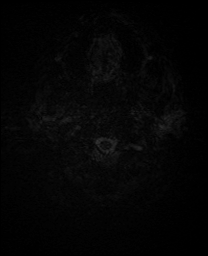
[im 20/60]
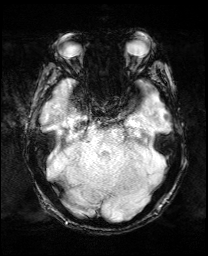
[im 40/60]
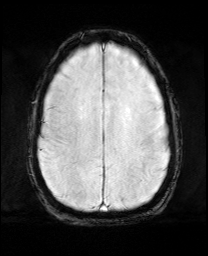
[im 60/60]
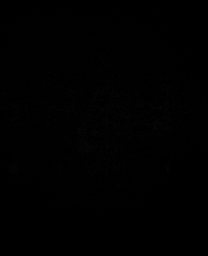

[Series 13: pha_images · axial · 3.0mm · 0.90mm/px · z∈[-98,+65]mm · 3 of 54 slices shown]
[im 1/54]
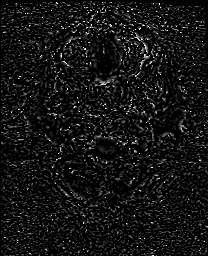
[im 27/54]
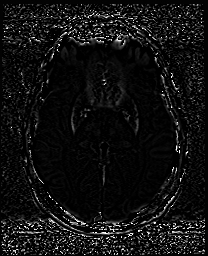
[im 54/54]
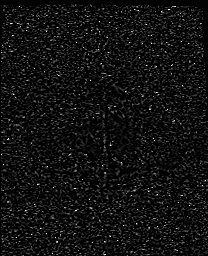

[Series 14: swi_images · axial · 3.0mm · 0.90mm/px · z∈[-101,+74]mm · 4 of 60 slices shown]
[im 1/60]
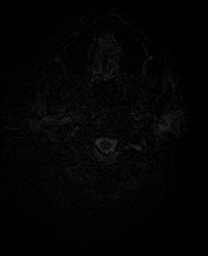
[im 20/60]
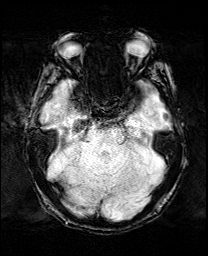
[im 40/60]
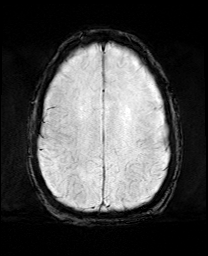
[im 60/60]
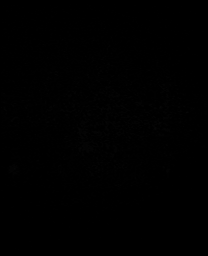

[Series 15: mip_images(sw) · axial · 24.0mm · 0.90mm/px · z∈[-91,+64]mm · 3 of 53 slices shown]
[im 1/53]
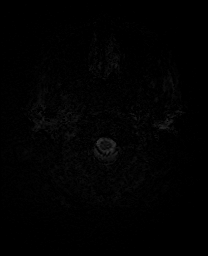
[im 27/53]
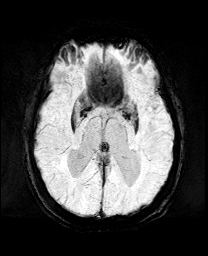
[im 53/53]
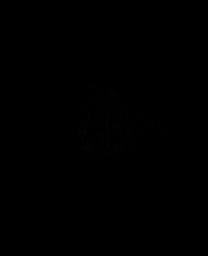

[Series 17: T2 post-contrast · coronal · 5.0mm · 0.72mm/px · 2 of 28 slices shown]
[im 1/28]
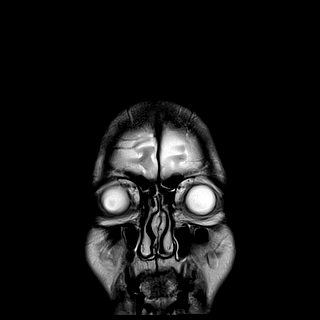
[im 28/28]
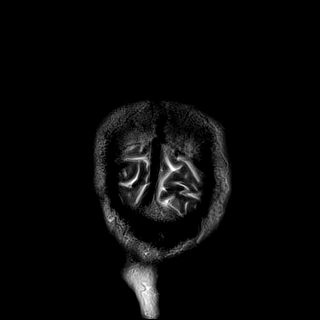

[Series 19: T1 post-contrast · coronal · 5.0mm · 0.34mm/px · 2 of 28 slices shown (1 of 2)]
[im 1/28]
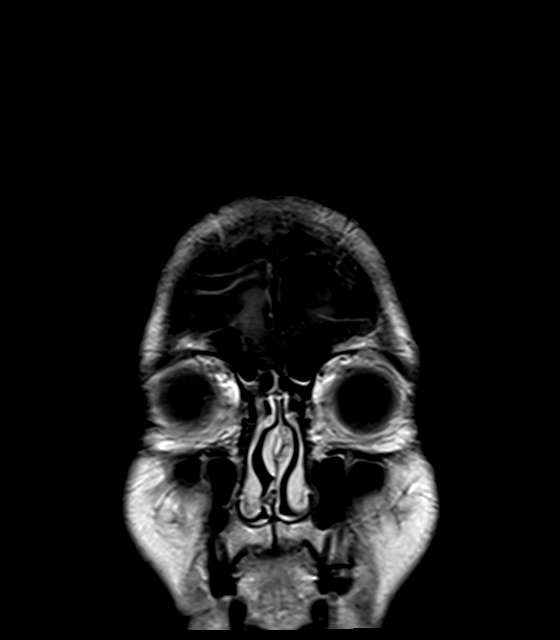
[im 28/28]
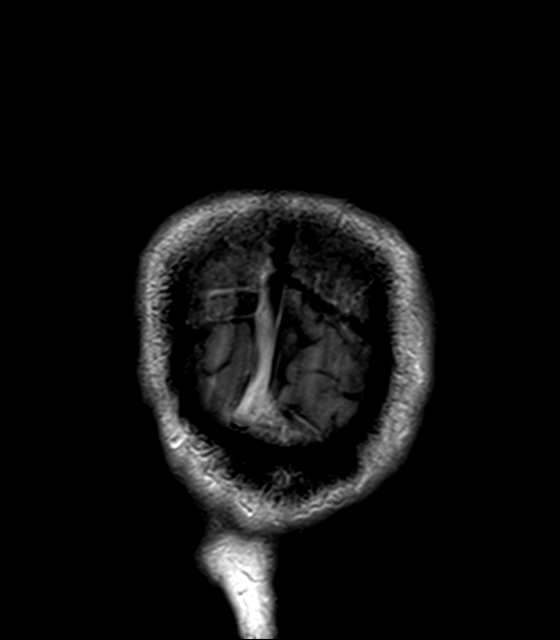

[Series 20: T1 post-contrast · sagittal · 5.0mm · 0.72mm/px · 1 of 23 slices shown (2 of 2)]
[im 1/23]
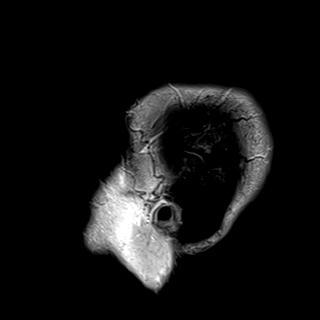

[40 of 48 positions shown; findings below may reference images not displayed]

FINDINGS: Brain: Negative for intracranial metastatic disease. No enhancing
mass lesion identified.

Mild cerebral atrophy. Mild white matter changes with scattered
small white matter hyperintensities similar to the prior study. No
acute infarct, hemorrhage, mass. Benign-appearing cyst left basal
ganglia is unchanged.

Vascular: Loss of flow void in the distal right vertebral artery is
unchanged and likely due to chronic occlusion. Otherwise normal
arterial flow voids at the skull base.

Skull and upper cervical spine: Negative

Sinuses/Orbits: Mild mucosal edema paranasal sinuses. Bilateral
cataract extraction.

Other: None
IMPRESSION: Negative for intracranial metastatic disease

Mild chronic microvascular ischemic change in the white matter

Chronic occlusion distal right vertebral artery.

## 2020-12-25 MED ORDER — GADOBUTROL 1 MMOL/ML IV SOLN
7.0000 mL | Freq: Once | INTRAVENOUS | Status: AC | PRN
  Administered 2020-12-25: 7 mL via INTRAVENOUS

## 2020-12-25 NOTE — Progress Notes (Signed)
Patient here today at [facility] for MRI brain W WO. Patient has medtronic device and was just seen at office yesterday for pacer check. [employer] sent to Mike-medtronic rep and Renee-cardiology PA. Orders received for DOO 90. Will re-program once scan is complete

## 2020-12-26 ENCOUNTER — Telehealth: Payer: Self-pay | Admitting: Radiation Oncology

## 2020-12-26 ENCOUNTER — Ambulatory Visit
Admission: RE | Admit: 2020-12-26 | Discharge: 2020-12-26 | Disposition: A | Discharge status: Home / Self Care | Payer: Medicare Other | Source: Ambulatory Visit | Attending: Radiation Oncology | Admitting: Radiation Oncology

## 2020-12-26 DIAGNOSIS — Z87891 Personal history of nicotine dependence: Secondary | ICD-10-CM | POA: Diagnosis not present

## 2020-12-26 DIAGNOSIS — C3431 Malignant neoplasm of lower lobe, right bronchus or lung: Secondary | ICD-10-CM | POA: Diagnosis not present

## 2020-12-26 DIAGNOSIS — Z510 Encounter for antineoplastic radiation therapy: Secondary | ICD-10-CM | POA: Diagnosis not present

## 2020-12-27 ENCOUNTER — Other Ambulatory Visit: Payer: Self-pay

## 2020-12-27 ENCOUNTER — Ambulatory Visit
Admission: RE | Admit: 2020-12-27 | Discharge: 2020-12-27 | Disposition: A | Discharge status: Home / Self Care | Payer: Medicare Other | Source: Ambulatory Visit | Attending: Radiation Oncology | Admitting: Radiation Oncology

## 2020-12-27 ENCOUNTER — Encounter: Payer: Self-pay | Admitting: Internal Medicine

## 2020-12-27 DIAGNOSIS — Z87891 Personal history of nicotine dependence: Secondary | ICD-10-CM | POA: Diagnosis not present

## 2020-12-27 DIAGNOSIS — Z510 Encounter for antineoplastic radiation therapy: Secondary | ICD-10-CM | POA: Diagnosis not present

## 2020-12-27 DIAGNOSIS — C3431 Malignant neoplasm of lower lobe, right bronchus or lung: Secondary | ICD-10-CM | POA: Diagnosis not present

## 2020-12-27 MED ORDER — SUCRALFATE 1 G PO TABS: 1.0000 g | ORAL_TABLET | Freq: Three times a day (TID) | ORAL | 2 refills | Status: DC

## 2020-12-27 MED ORDER — MEMANTINE HCL 5 MG PO TABS: ORAL_TABLET | ORAL | 0 refills | Status: DC

## 2020-12-27 MED ORDER — MEMANTINE HCL 10 MG PO TABS: 10.0000 mg | ORAL_TABLET | Freq: Two times a day (BID) | ORAL | 4 refills | Status: AC

## 2020-12-27 MED ORDER — TRIAMCINOLONE ACETONIDE 0.1 % EX OINT: TOPICAL_OINTMENT | Freq: Two times a day (BID) | CUTANEOUS | 0 refills | Status: DC | PRN

## 2020-12-27 NOTE — Telephone Encounter (Signed)
I called the patient and reviewed his MRI results. He is pleased and saw this in Mychart as well. We discussed simulation to the brain for PCI. We reviewed risks benefits short and long term effects of treatment and the addition of Namenda with hippocampal sparing he has discussed previously with Dr. Lisbeth Renshaw. He will simulate on 01/09/21 for PCI and start treatment either 01/20/21 or 01/27/21. We discussed the side effects of skin irritation and esophagitis from his current chest treatment. New Rxs were sent in for carafate, temovate, and namenda.

## 2020-12-31 ENCOUNTER — Other Ambulatory Visit: Payer: Self-pay

## 2020-12-31 ENCOUNTER — Ambulatory Visit
Admission: RE | Admit: 2020-12-31 | Discharge: 2020-12-31 | Disposition: A | Discharge status: Home / Self Care | Payer: Medicare Other | Source: Ambulatory Visit | Attending: Radiation Oncology | Admitting: Radiation Oncology

## 2020-12-31 DIAGNOSIS — C3431 Malignant neoplasm of lower lobe, right bronchus or lung: Secondary | ICD-10-CM | POA: Diagnosis not present

## 2020-12-31 DIAGNOSIS — Z510 Encounter for antineoplastic radiation therapy: Secondary | ICD-10-CM | POA: Diagnosis not present

## 2020-12-31 DIAGNOSIS — Z87891 Personal history of nicotine dependence: Secondary | ICD-10-CM | POA: Diagnosis not present

## 2021-01-01 ENCOUNTER — Ambulatory Visit
Admission: RE | Admit: 2021-01-01 | Discharge: 2021-01-01 | Disposition: A | Discharge status: Home / Self Care | Payer: Medicare Other | Source: Ambulatory Visit | Attending: Radiation Oncology | Admitting: Radiation Oncology

## 2021-01-01 ENCOUNTER — Ambulatory Visit: Payer: Medicare Other | Admitting: Radiation Oncology

## 2021-01-01 DIAGNOSIS — Z510 Encounter for antineoplastic radiation therapy: Secondary | ICD-10-CM | POA: Insufficient documentation

## 2021-01-01 DIAGNOSIS — C3431 Malignant neoplasm of lower lobe, right bronchus or lung: Secondary | ICD-10-CM | POA: Insufficient documentation

## 2021-01-01 DIAGNOSIS — Z87891 Personal history of nicotine dependence: Secondary | ICD-10-CM | POA: Diagnosis not present

## 2021-01-02 ENCOUNTER — Other Ambulatory Visit: Payer: Self-pay | Admitting: Radiation Oncology

## 2021-01-02 ENCOUNTER — Other Ambulatory Visit: Payer: Self-pay

## 2021-01-02 ENCOUNTER — Ambulatory Visit
Admission: RE | Admit: 2021-01-02 | Discharge: 2021-01-02 | Disposition: A | Discharge status: Home / Self Care | Payer: Medicare Other | Source: Ambulatory Visit | Attending: Radiation Oncology | Admitting: Radiation Oncology

## 2021-01-02 DIAGNOSIS — C3431 Malignant neoplasm of lower lobe, right bronchus or lung: Secondary | ICD-10-CM | POA: Diagnosis not present

## 2021-01-02 DIAGNOSIS — Z87891 Personal history of nicotine dependence: Secondary | ICD-10-CM | POA: Diagnosis not present

## 2021-01-02 DIAGNOSIS — Z510 Encounter for antineoplastic radiation therapy: Secondary | ICD-10-CM | POA: Diagnosis not present

## 2021-01-02 MED ORDER — TRIAMCINOLONE ACETONIDE 0.1 % EX CREA: 1.0000 "application " | TOPICAL_CREAM | Freq: Two times a day (BID) | CUTANEOUS | 1 refills | Status: DC

## 2021-01-03 ENCOUNTER — Ambulatory Visit
Admission: RE | Admit: 2021-01-03 | Discharge: 2021-01-03 | Disposition: A | Discharge status: Home / Self Care | Payer: Medicare Other | Source: Ambulatory Visit | Attending: Radiation Oncology | Admitting: Radiation Oncology

## 2021-01-03 DIAGNOSIS — Z87891 Personal history of nicotine dependence: Secondary | ICD-10-CM | POA: Diagnosis not present

## 2021-01-03 DIAGNOSIS — Z510 Encounter for antineoplastic radiation therapy: Secondary | ICD-10-CM | POA: Diagnosis not present

## 2021-01-03 DIAGNOSIS — C3431 Malignant neoplasm of lower lobe, right bronchus or lung: Secondary | ICD-10-CM | POA: Diagnosis not present

## 2021-01-06 ENCOUNTER — Other Ambulatory Visit: Payer: Self-pay

## 2021-01-06 ENCOUNTER — Ambulatory Visit
Admission: RE | Admit: 2021-01-06 | Discharge: 2021-01-06 | Disposition: A | Discharge status: Home / Self Care | Payer: Medicare Other | Source: Ambulatory Visit | Attending: Radiation Oncology | Admitting: Radiation Oncology

## 2021-01-06 DIAGNOSIS — Z87891 Personal history of nicotine dependence: Secondary | ICD-10-CM | POA: Diagnosis not present

## 2021-01-06 DIAGNOSIS — Z510 Encounter for antineoplastic radiation therapy: Secondary | ICD-10-CM | POA: Diagnosis not present

## 2021-01-06 DIAGNOSIS — C3431 Malignant neoplasm of lower lobe, right bronchus or lung: Secondary | ICD-10-CM | POA: Diagnosis not present

## 2021-01-07 ENCOUNTER — Ambulatory Visit
Admission: RE | Admit: 2021-01-07 | Discharge: 2021-01-07 | Disposition: A | Discharge status: Home / Self Care | Payer: Medicare Other | Source: Ambulatory Visit | Attending: Radiation Oncology | Admitting: Radiation Oncology

## 2021-01-07 DIAGNOSIS — C3431 Malignant neoplasm of lower lobe, right bronchus or lung: Secondary | ICD-10-CM | POA: Diagnosis not present

## 2021-01-07 DIAGNOSIS — Z510 Encounter for antineoplastic radiation therapy: Secondary | ICD-10-CM | POA: Diagnosis not present

## 2021-01-07 DIAGNOSIS — Z87891 Personal history of nicotine dependence: Secondary | ICD-10-CM | POA: Diagnosis not present

## 2021-01-08 ENCOUNTER — Ambulatory Visit
Admission: RE | Admit: 2021-01-08 | Discharge: 2021-01-08 | Disposition: A | Discharge status: Home / Self Care | Payer: Medicare Other | Source: Ambulatory Visit | Attending: Radiation Oncology | Admitting: Radiation Oncology

## 2021-01-08 ENCOUNTER — Other Ambulatory Visit: Payer: Self-pay

## 2021-01-08 DIAGNOSIS — D2261 Melanocytic nevi of right upper limb, including shoulder: Secondary | ICD-10-CM | POA: Diagnosis not present

## 2021-01-08 DIAGNOSIS — Z510 Encounter for antineoplastic radiation therapy: Secondary | ICD-10-CM | POA: Diagnosis not present

## 2021-01-08 DIAGNOSIS — Z85828 Personal history of other malignant neoplasm of skin: Secondary | ICD-10-CM | POA: Diagnosis not present

## 2021-01-08 DIAGNOSIS — D225 Melanocytic nevi of trunk: Secondary | ICD-10-CM | POA: Diagnosis not present

## 2021-01-08 DIAGNOSIS — D2262 Melanocytic nevi of left upper limb, including shoulder: Secondary | ICD-10-CM | POA: Diagnosis not present

## 2021-01-08 DIAGNOSIS — L821 Other seborrheic keratosis: Secondary | ICD-10-CM | POA: Diagnosis not present

## 2021-01-08 DIAGNOSIS — L570 Actinic keratosis: Secondary | ICD-10-CM | POA: Diagnosis not present

## 2021-01-08 DIAGNOSIS — C3431 Malignant neoplasm of lower lobe, right bronchus or lung: Secondary | ICD-10-CM | POA: Diagnosis not present

## 2021-01-08 DIAGNOSIS — Z87891 Personal history of nicotine dependence: Secondary | ICD-10-CM | POA: Diagnosis not present

## 2021-01-09 ENCOUNTER — Ambulatory Visit
Admission: RE | Admit: 2021-01-09 | Discharge: 2021-01-09 | Disposition: A | Discharge status: Home / Self Care | Payer: Medicare Other | Source: Ambulatory Visit | Attending: Radiation Oncology | Admitting: Radiation Oncology

## 2021-01-09 DIAGNOSIS — C3431 Malignant neoplasm of lower lobe, right bronchus or lung: Secondary | ICD-10-CM | POA: Diagnosis not present

## 2021-01-09 DIAGNOSIS — Z298 Encounter for other specified prophylactic measures: Secondary | ICD-10-CM | POA: Diagnosis not present

## 2021-01-09 DIAGNOSIS — Z510 Encounter for antineoplastic radiation therapy: Secondary | ICD-10-CM | POA: Diagnosis not present

## 2021-01-09 DIAGNOSIS — Z87891 Personal history of nicotine dependence: Secondary | ICD-10-CM | POA: Diagnosis not present

## 2021-01-10 ENCOUNTER — Other Ambulatory Visit: Payer: Self-pay

## 2021-01-10 ENCOUNTER — Ambulatory Visit
Admission: RE | Admit: 2021-01-10 | Discharge: 2021-01-10 | Disposition: A | Discharge status: Home / Self Care | Payer: Medicare Other | Source: Ambulatory Visit | Attending: Radiation Oncology | Admitting: Radiation Oncology

## 2021-01-10 DIAGNOSIS — Z87891 Personal history of nicotine dependence: Secondary | ICD-10-CM | POA: Diagnosis not present

## 2021-01-10 DIAGNOSIS — Z510 Encounter for antineoplastic radiation therapy: Secondary | ICD-10-CM | POA: Diagnosis not present

## 2021-01-10 DIAGNOSIS — C3431 Malignant neoplasm of lower lobe, right bronchus or lung: Secondary | ICD-10-CM | POA: Diagnosis not present

## 2021-01-13 ENCOUNTER — Ambulatory Visit
Admission: RE | Admit: 2021-01-13 | Discharge: 2021-01-13 | Disposition: A | Discharge status: Home / Self Care | Payer: Medicare Other | Source: Ambulatory Visit | Attending: Radiation Oncology | Admitting: Radiation Oncology

## 2021-01-13 ENCOUNTER — Other Ambulatory Visit: Payer: Self-pay

## 2021-01-13 DIAGNOSIS — Z510 Encounter for antineoplastic radiation therapy: Secondary | ICD-10-CM | POA: Diagnosis not present

## 2021-01-13 DIAGNOSIS — C3431 Malignant neoplasm of lower lobe, right bronchus or lung: Secondary | ICD-10-CM | POA: Diagnosis not present

## 2021-01-13 DIAGNOSIS — Z87891 Personal history of nicotine dependence: Secondary | ICD-10-CM | POA: Diagnosis not present

## 2021-01-13 NOTE — Progress Notes (Signed)
Remote pacemaker transmission.   

## 2021-01-14 ENCOUNTER — Ambulatory Visit
Admission: RE | Admit: 2021-01-14 | Discharge: 2021-01-14 | Disposition: A | Discharge status: Home / Self Care | Payer: Medicare Other | Source: Ambulatory Visit | Attending: Radiation Oncology | Admitting: Radiation Oncology

## 2021-01-14 DIAGNOSIS — Z87891 Personal history of nicotine dependence: Secondary | ICD-10-CM | POA: Diagnosis not present

## 2021-01-14 DIAGNOSIS — Z510 Encounter for antineoplastic radiation therapy: Secondary | ICD-10-CM | POA: Diagnosis not present

## 2021-01-14 DIAGNOSIS — C3431 Malignant neoplasm of lower lobe, right bronchus or lung: Secondary | ICD-10-CM | POA: Diagnosis not present

## 2021-01-15 ENCOUNTER — Ambulatory Visit
Admission: RE | Admit: 2021-01-15 | Discharge: 2021-01-15 | Disposition: A | Discharge status: Home / Self Care | Payer: Medicare Other | Source: Ambulatory Visit | Attending: Radiation Oncology | Admitting: Radiation Oncology

## 2021-01-15 DIAGNOSIS — Z510 Encounter for antineoplastic radiation therapy: Secondary | ICD-10-CM | POA: Diagnosis not present

## 2021-01-15 DIAGNOSIS — Z87891 Personal history of nicotine dependence: Secondary | ICD-10-CM | POA: Diagnosis not present

## 2021-01-15 DIAGNOSIS — C3431 Malignant neoplasm of lower lobe, right bronchus or lung: Secondary | ICD-10-CM | POA: Diagnosis not present

## 2021-01-16 ENCOUNTER — Ambulatory Visit
Admission: RE | Admit: 2021-01-16 | Discharge: 2021-01-16 | Disposition: A | Discharge status: Home / Self Care | Payer: Medicare Other | Source: Ambulatory Visit | Attending: Radiation Oncology | Admitting: Radiation Oncology

## 2021-01-16 ENCOUNTER — Other Ambulatory Visit: Payer: Self-pay

## 2021-01-16 DIAGNOSIS — C3431 Malignant neoplasm of lower lobe, right bronchus or lung: Secondary | ICD-10-CM | POA: Diagnosis not present

## 2021-01-16 DIAGNOSIS — Z510 Encounter for antineoplastic radiation therapy: Secondary | ICD-10-CM | POA: Diagnosis not present

## 2021-01-16 DIAGNOSIS — Z87891 Personal history of nicotine dependence: Secondary | ICD-10-CM | POA: Diagnosis not present

## 2021-01-17 ENCOUNTER — Ambulatory Visit
Admission: RE | Admit: 2021-01-17 | Discharge: 2021-01-17 | Disposition: A | Discharge status: Home / Self Care | Payer: Medicare Other | Source: Ambulatory Visit | Attending: Radiation Oncology | Admitting: Radiation Oncology

## 2021-01-17 DIAGNOSIS — C3431 Malignant neoplasm of lower lobe, right bronchus or lung: Secondary | ICD-10-CM | POA: Diagnosis not present

## 2021-01-17 DIAGNOSIS — Z298 Encounter for other specified prophylactic measures: Secondary | ICD-10-CM | POA: Diagnosis not present

## 2021-01-17 DIAGNOSIS — Z87891 Personal history of nicotine dependence: Secondary | ICD-10-CM | POA: Diagnosis not present

## 2021-01-17 DIAGNOSIS — Z510 Encounter for antineoplastic radiation therapy: Secondary | ICD-10-CM | POA: Diagnosis not present

## 2021-01-20 ENCOUNTER — Other Ambulatory Visit: Payer: Self-pay

## 2021-01-20 ENCOUNTER — Other Ambulatory Visit: Payer: Self-pay | Admitting: Radiation Oncology

## 2021-01-20 ENCOUNTER — Ambulatory Visit
Admission: RE | Admit: 2021-01-20 | Discharge: 2021-01-20 | Disposition: A | Discharge status: Home / Self Care | Payer: Medicare Other | Source: Ambulatory Visit | Attending: Radiation Oncology | Admitting: Radiation Oncology

## 2021-01-20 DIAGNOSIS — Z298 Encounter for other specified prophylactic measures: Secondary | ICD-10-CM | POA: Diagnosis not present

## 2021-01-20 DIAGNOSIS — Z510 Encounter for antineoplastic radiation therapy: Secondary | ICD-10-CM | POA: Diagnosis not present

## 2021-01-20 DIAGNOSIS — Z87891 Personal history of nicotine dependence: Secondary | ICD-10-CM | POA: Diagnosis not present

## 2021-01-20 DIAGNOSIS — C3431 Malignant neoplasm of lower lobe, right bronchus or lung: Secondary | ICD-10-CM | POA: Diagnosis not present

## 2021-01-20 MED ORDER — ALBUTEROL SULFATE HFA 108 (90 BASE) MCG/ACT IN AERS: 1.0000 | INHALATION_SPRAY | Freq: Four times a day (QID) | RESPIRATORY_TRACT | 2 refills | Status: AC | PRN

## 2021-01-20 NOTE — Progress Notes (Signed)
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  Last Tuesday he completed a course of 33 fractions to the chest for lung cancer.  The patient has been having cough and has used Hycodan however he feels quite woozy and sleepy when taking this.  He is interested in whether a rescue inhaler may be helpful.  He does not have established history with a pulmonologist.  After reviewing his pet imaging, it would be reasonable to consider short acting bronchodilator given the proximity of his treated tumor to the airway.  I also advised nursing to encourage him to take half of the dose of his Hycodan at nighttime if he needs this in the evening.  If his symptoms are no better within the next week to 2 weeks, we would refer him to pulmonary medicine as well.

## 2021-01-21 ENCOUNTER — Ambulatory Visit
Admission: RE | Admit: 2021-01-21 | Discharge: 2021-01-21 | Disposition: A | Discharge status: Home / Self Care | Payer: Medicare Other | Source: Ambulatory Visit | Attending: Radiation Oncology | Admitting: Radiation Oncology

## 2021-01-21 DIAGNOSIS — Z87891 Personal history of nicotine dependence: Secondary | ICD-10-CM | POA: Diagnosis not present

## 2021-01-21 DIAGNOSIS — Z510 Encounter for antineoplastic radiation therapy: Secondary | ICD-10-CM | POA: Diagnosis not present

## 2021-01-21 DIAGNOSIS — C3431 Malignant neoplasm of lower lobe, right bronchus or lung: Secondary | ICD-10-CM | POA: Diagnosis not present

## 2021-01-21 DIAGNOSIS — Z298 Encounter for other specified prophylactic measures: Secondary | ICD-10-CM | POA: Diagnosis not present

## 2021-01-22 ENCOUNTER — Other Ambulatory Visit: Payer: Self-pay

## 2021-01-22 ENCOUNTER — Ambulatory Visit
Admission: RE | Admit: 2021-01-22 | Discharge: 2021-01-22 | Disposition: A | Discharge status: Home / Self Care | Payer: Medicare Other | Source: Ambulatory Visit | Attending: Radiation Oncology | Admitting: Radiation Oncology

## 2021-01-22 DIAGNOSIS — Z87891 Personal history of nicotine dependence: Secondary | ICD-10-CM | POA: Diagnosis not present

## 2021-01-22 DIAGNOSIS — C3431 Malignant neoplasm of lower lobe, right bronchus or lung: Secondary | ICD-10-CM | POA: Diagnosis not present

## 2021-01-22 DIAGNOSIS — Z298 Encounter for other specified prophylactic measures: Secondary | ICD-10-CM | POA: Diagnosis not present

## 2021-01-22 DIAGNOSIS — Z510 Encounter for antineoplastic radiation therapy: Secondary | ICD-10-CM | POA: Diagnosis not present

## 2021-01-23 ENCOUNTER — Ambulatory Visit
Admission: RE | Admit: 2021-01-23 | Discharge: 2021-01-23 | Disposition: A | Discharge status: Home / Self Care | Payer: Medicare Other | Source: Ambulatory Visit | Attending: Radiation Oncology | Admitting: Radiation Oncology

## 2021-01-23 DIAGNOSIS — Z87891 Personal history of nicotine dependence: Secondary | ICD-10-CM | POA: Diagnosis not present

## 2021-01-23 DIAGNOSIS — Z510 Encounter for antineoplastic radiation therapy: Secondary | ICD-10-CM | POA: Diagnosis not present

## 2021-01-23 DIAGNOSIS — C3431 Malignant neoplasm of lower lobe, right bronchus or lung: Secondary | ICD-10-CM | POA: Diagnosis not present

## 2021-01-23 DIAGNOSIS — Z298 Encounter for other specified prophylactic measures: Secondary | ICD-10-CM | POA: Diagnosis not present

## 2021-01-24 ENCOUNTER — Ambulatory Visit
Admission: RE | Admit: 2021-01-24 | Discharge: 2021-01-24 | Disposition: A | Discharge status: Home / Self Care | Payer: Medicare Other | Source: Ambulatory Visit | Attending: Radiation Oncology | Admitting: Radiation Oncology

## 2021-01-24 ENCOUNTER — Other Ambulatory Visit: Payer: Self-pay

## 2021-01-24 DIAGNOSIS — Z298 Encounter for other specified prophylactic measures: Secondary | ICD-10-CM | POA: Diagnosis not present

## 2021-01-24 DIAGNOSIS — Z510 Encounter for antineoplastic radiation therapy: Secondary | ICD-10-CM | POA: Diagnosis not present

## 2021-01-24 DIAGNOSIS — Z87891 Personal history of nicotine dependence: Secondary | ICD-10-CM | POA: Diagnosis not present

## 2021-01-24 DIAGNOSIS — C3431 Malignant neoplasm of lower lobe, right bronchus or lung: Secondary | ICD-10-CM | POA: Diagnosis not present

## 2021-01-27 ENCOUNTER — Ambulatory Visit
Admission: RE | Admit: 2021-01-27 | Discharge: 2021-01-27 | Disposition: A | Discharge status: Home / Self Care | Payer: Medicare Other | Source: Ambulatory Visit | Attending: Radiation Oncology | Admitting: Radiation Oncology

## 2021-01-27 ENCOUNTER — Other Ambulatory Visit: Payer: Self-pay

## 2021-01-27 ENCOUNTER — Telehealth: Payer: Self-pay | Admitting: Radiation Oncology

## 2021-01-27 DIAGNOSIS — C3431 Malignant neoplasm of lower lobe, right bronchus or lung: Secondary | ICD-10-CM | POA: Diagnosis not present

## 2021-01-27 DIAGNOSIS — Z87891 Personal history of nicotine dependence: Secondary | ICD-10-CM | POA: Diagnosis not present

## 2021-01-27 DIAGNOSIS — Z510 Encounter for antineoplastic radiation therapy: Secondary | ICD-10-CM | POA: Diagnosis not present

## 2021-01-27 DIAGNOSIS — Z298 Encounter for other specified prophylactic measures: Secondary | ICD-10-CM | POA: Diagnosis not present

## 2021-01-27 NOTE — Telephone Encounter (Signed)
I called and left a voicemail for the patient he had previously left a message last week about the safety of taking Namenda and dextromethorphan which are in the same category of NMDA receptor antagonists.  The category was a C and the recommendation from [place] interaction checker through up-to-date is C rating and to monitor therapy.  I will print this as well for the patient.

## 2021-01-28 ENCOUNTER — Ambulatory Visit
Admission: RE | Admit: 2021-01-28 | Discharge: 2021-01-28 | Disposition: A | Discharge status: Home / Self Care | Payer: Medicare Other | Source: Ambulatory Visit | Attending: Radiation Oncology | Admitting: Radiation Oncology

## 2021-01-28 ENCOUNTER — Other Ambulatory Visit: Payer: Self-pay

## 2021-01-28 DIAGNOSIS — Z87891 Personal history of nicotine dependence: Secondary | ICD-10-CM | POA: Diagnosis not present

## 2021-01-28 DIAGNOSIS — C3431 Malignant neoplasm of lower lobe, right bronchus or lung: Secondary | ICD-10-CM | POA: Diagnosis not present

## 2021-01-28 DIAGNOSIS — Z510 Encounter for antineoplastic radiation therapy: Secondary | ICD-10-CM | POA: Diagnosis not present

## 2021-01-28 DIAGNOSIS — Z298 Encounter for other specified prophylactic measures: Secondary | ICD-10-CM | POA: Diagnosis not present

## 2021-01-29 ENCOUNTER — Ambulatory Visit
Admission: RE | Admit: 2021-01-29 | Discharge: 2021-01-29 | Disposition: A | Discharge status: Home / Self Care | Payer: Medicare Other | Source: Ambulatory Visit | Attending: Radiation Oncology | Admitting: Radiation Oncology

## 2021-01-29 ENCOUNTER — Other Ambulatory Visit: Payer: Self-pay

## 2021-01-29 DIAGNOSIS — Z510 Encounter for antineoplastic radiation therapy: Secondary | ICD-10-CM | POA: Diagnosis not present

## 2021-01-29 DIAGNOSIS — Z298 Encounter for other specified prophylactic measures: Secondary | ICD-10-CM | POA: Diagnosis not present

## 2021-01-29 DIAGNOSIS — C3431 Malignant neoplasm of lower lobe, right bronchus or lung: Secondary | ICD-10-CM | POA: Diagnosis not present

## 2021-01-29 DIAGNOSIS — Z87891 Personal history of nicotine dependence: Secondary | ICD-10-CM | POA: Diagnosis not present

## 2021-01-30 ENCOUNTER — Ambulatory Visit
Admission: RE | Admit: 2021-01-30 | Discharge: 2021-01-30 | Disposition: A | Discharge status: Home / Self Care | Payer: Medicare Other | Source: Ambulatory Visit | Attending: Radiation Oncology | Admitting: Radiation Oncology

## 2021-01-30 DIAGNOSIS — Z87891 Personal history of nicotine dependence: Secondary | ICD-10-CM | POA: Diagnosis not present

## 2021-01-30 DIAGNOSIS — Z510 Encounter for antineoplastic radiation therapy: Secondary | ICD-10-CM | POA: Diagnosis not present

## 2021-01-30 DIAGNOSIS — C3431 Malignant neoplasm of lower lobe, right bronchus or lung: Secondary | ICD-10-CM | POA: Diagnosis not present

## 2021-01-30 DIAGNOSIS — Z298 Encounter for other specified prophylactic measures: Secondary | ICD-10-CM | POA: Diagnosis not present

## 2021-01-31 ENCOUNTER — Other Ambulatory Visit: Payer: Self-pay

## 2021-01-31 ENCOUNTER — Ambulatory Visit
Admission: RE | Admit: 2021-01-31 | Discharge: 2021-01-31 | Disposition: A | Discharge status: Home / Self Care | Payer: Medicare Other | Source: Ambulatory Visit | Attending: Radiation Oncology | Admitting: Radiation Oncology

## 2021-01-31 ENCOUNTER — Encounter: Payer: Self-pay | Admitting: Radiation Oncology

## 2021-01-31 DIAGNOSIS — Z298 Encounter for other specified prophylactic measures: Secondary | ICD-10-CM | POA: Diagnosis not present

## 2021-01-31 DIAGNOSIS — Z510 Encounter for antineoplastic radiation therapy: Secondary | ICD-10-CM | POA: Insufficient documentation

## 2021-01-31 DIAGNOSIS — C3431 Malignant neoplasm of lower lobe, right bronchus or lung: Secondary | ICD-10-CM | POA: Diagnosis not present

## 2021-01-31 DIAGNOSIS — Z87891 Personal history of nicotine dependence: Secondary | ICD-10-CM | POA: Diagnosis not present

## 2021-02-01 ENCOUNTER — Encounter: Payer: Self-pay | Admitting: Internal Medicine

## 2021-02-01 NOTE — Progress Notes (Signed)
                                                                                                                                                             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 He did complete his MRI brain in the midst of his chest radiation and no disease was seen so his also received PCI radiation to the brain. We began Namenda during radiation with hopes of cognitive preservation.  Plan: The patient will receive a call in about one month from the radiation oncology department. He will continue follow up with Dr. Julien Nordmann as well. He will also start to be followed in brain oncology conference with routine MRIs of the brain to rule out disease that may require salvage SRS.  ________________________________________________    Carola Rhine, PAC

## 2021-02-04 ENCOUNTER — Telehealth: Payer: Self-pay | Admitting: Internal Medicine

## 2021-02-04 NOTE — Telephone Encounter (Signed)
Scheduled appt per 7/5 sch msg. Called pt, no answer. Left msg with appt date and time.

## 2021-02-05 ENCOUNTER — Other Ambulatory Visit: Payer: Self-pay

## 2021-02-05 ENCOUNTER — Telehealth: Payer: Self-pay | Admitting: Internal Medicine

## 2021-02-05 ENCOUNTER — Inpatient Hospital Stay: Payer: Medicare Other | Attending: Internal Medicine

## 2021-02-05 DIAGNOSIS — C3432 Malignant neoplasm of lower lobe, left bronchus or lung: Secondary | ICD-10-CM | POA: Insufficient documentation

## 2021-02-05 DIAGNOSIS — C3490 Malignant neoplasm of unspecified part of unspecified bronchus or lung: Secondary | ICD-10-CM

## 2021-02-05 LAB — CBC WITH DIFFERENTIAL (CANCER CENTER ONLY)
Abs Immature Granulocytes: 0.01 10*3/uL (ref 0.00–0.07)
Basophils Absolute: 0 10*3/uL (ref 0.0–0.1)
Basophils Relative: 1 %
Eosinophils Absolute: 0.2 10*3/uL (ref 0.0–0.5)
Eosinophils Relative: 5 %
HCT: 33.5 % — ABNORMAL LOW (ref 39.0–52.0)
Hemoglobin: 11.1 g/dL — ABNORMAL LOW (ref 13.0–17.0)
Immature Granulocytes: 0 %
Lymphocytes Relative: 8 %
Lymphs Abs: 0.3 10*3/uL — ABNORMAL LOW (ref 0.7–4.0)
MCH: 29.8 pg (ref 26.0–34.0)
MCHC: 33.1 g/dL (ref 30.0–36.0)
MCV: 89.8 fL (ref 80.0–100.0)
Monocytes Absolute: 0.7 10*3/uL (ref 0.1–1.0)
Monocytes Relative: 19 %
Neutro Abs: 2.4 10*3/uL (ref 1.7–7.7)
Neutrophils Relative %: 67 %
Platelet Count: 86 10*3/uL — ABNORMAL LOW (ref 150–400)
RBC: 3.73 MIL/uL — ABNORMAL LOW (ref 4.22–5.81)
RDW: 17.1 % — ABNORMAL HIGH (ref 11.5–15.5)
WBC Count: 3.5 10*3/uL — ABNORMAL LOW (ref 4.0–10.5)
nRBC: 0 % (ref 0.0–0.2)

## 2021-02-05 LAB — CMP (CANCER CENTER ONLY)
ALT: 12 U/L (ref 0–44)
AST: 21 U/L (ref 15–41)
Albumin: 3.4 g/dL — ABNORMAL LOW (ref 3.5–5.0)
Alkaline Phosphatase: 371 U/L — ABNORMAL HIGH (ref 38–126)
Anion gap: 7 (ref 5–15)
BUN: 19 mg/dL (ref 8–23)
CO2: 28 mmol/L (ref 22–32)
Calcium: 10.4 mg/dL — ABNORMAL HIGH (ref 8.9–10.3)
Chloride: 105 mmol/L (ref 98–111)
Creatinine: 1.08 mg/dL (ref 0.61–1.24)
GFR, Estimated: >60 mL/min (ref >60)
Glucose, Bld: 102 mg/dL — ABNORMAL HIGH (ref 70–99)
Potassium: 3.9 mmol/L (ref 3.5–5.1)
Sodium: 140 mmol/L (ref 135–145)
Total Bilirubin: 1.7 mg/dL — ABNORMAL HIGH (ref 0.3–1.2)
Total Protein: 6.8 g/dL (ref 6.5–8.1)

## 2021-02-05 NOTE — Telephone Encounter (Signed)
R/s appt per 7/6 sch msg. Pt aware.

## 2021-02-06 ENCOUNTER — Other Ambulatory Visit: Payer: Medicare Other

## 2021-02-10 ENCOUNTER — Telehealth: Payer: Self-pay | Admitting: *Deleted

## 2021-02-10 ENCOUNTER — Other Ambulatory Visit: Payer: Medicare Other

## 2021-02-10 NOTE — Telephone Encounter (Signed)
Left a voicemail for the patient to let him know that the symptoms he is having is unlikely caused by his recent radiation to his chest and brain.  I spoke with the PA and she states his appetite should start to rebound in another couple of weeks.  She offered him a consultation with the nutritionist.  Left my call back number for return call with further questions.  Will continue to follow as necessary.  Gloriajean Dell. Leonie Green, BSN

## 2021-02-11 ENCOUNTER — Other Ambulatory Visit: Payer: Self-pay | Admitting: Radiation Oncology

## 2021-02-12 ENCOUNTER — Ambulatory Visit: Payer: Medicare Other | Admitting: Internal Medicine

## 2021-02-17 ENCOUNTER — Telehealth (HOSPITAL_COMMUNITY): Payer: Self-pay | Admitting: *Deleted

## 2021-02-17 NOTE — Telephone Encounter (Signed)
Patient called in stating he has no energy and would like to send a transmission to see if his afib is flaring up or if energy levels are related to radiation therapy. Pt to send transmission will follow up once results known. [phone]

## 2021-02-17 NOTE — Telephone Encounter (Signed)
Manual transmission received.  Normal Device function.  Pt is not in AF.  No recent arrythmias logged.    Spoke with pt advised of the above, EGMs reflect AV synchrony.

## 2021-02-20 ENCOUNTER — Telehealth: Payer: Self-pay | Admitting: *Deleted

## 2021-02-20 ENCOUNTER — Encounter: Payer: Self-pay | Admitting: *Deleted

## 2021-02-20 NOTE — Telephone Encounter (Signed)
Patient called again about his concerns.    Attempted 3 times to reach patient each time calls go directly to voicemail.  Left second voicemail.  Attempted home number with no success.  Pending call back from patient to discuss recommendations by Radiation Oncology.

## 2021-02-20 NOTE — Telephone Encounter (Signed)
Patient called to report concern for prolonged and worsening symptoms.  He states that he finished radiation 3 weeks ago and continues to have decreased appetite, lethargy, and increased cough.  All of these are radiation changes and can be expected to last for some time after radiation.    Routed to Dr Lisbeth Renshaw, Shona Simpson, PA to further evaluate and routed to Dr Julien Nordmann as well.

## 2021-02-25 ENCOUNTER — Telehealth: Payer: Self-pay | Admitting: *Deleted

## 2021-02-25 ENCOUNTER — Other Ambulatory Visit: Payer: Self-pay

## 2021-02-25 ENCOUNTER — Inpatient Hospital Stay: Payer: Medicare Other

## 2021-02-25 DIAGNOSIS — C3432 Malignant neoplasm of lower lobe, left bronchus or lung: Secondary | ICD-10-CM | POA: Diagnosis not present

## 2021-02-25 DIAGNOSIS — C3431 Malignant neoplasm of lower lobe, right bronchus or lung: Secondary | ICD-10-CM

## 2021-02-25 LAB — CBC WITH DIFFERENTIAL (CANCER CENTER ONLY)
Abs Immature Granulocytes: 0.01 10*3/uL (ref 0.00–0.07)
Basophils Absolute: 0 10*3/uL (ref 0.0–0.1)
Basophils Relative: 1 %
Eosinophils Absolute: 0.2 10*3/uL (ref 0.0–0.5)
Eosinophils Relative: 6 %
HCT: 32.2 % — ABNORMAL LOW (ref 39.0–52.0)
Hemoglobin: 10.5 g/dL — ABNORMAL LOW (ref 13.0–17.0)
Immature Granulocytes: 0 %
Lymphocytes Relative: 7 %
Lymphs Abs: 0.3 10*3/uL — ABNORMAL LOW (ref 0.7–4.0)
MCH: 29.2 pg (ref 26.0–34.0)
MCHC: 32.6 g/dL (ref 30.0–36.0)
MCV: 89.7 fL (ref 80.0–100.0)
Monocytes Absolute: 0.6 10*3/uL (ref 0.1–1.0)
Monocytes Relative: 17 %
Neutro Abs: 2.6 10*3/uL (ref 1.7–7.7)
Neutrophils Relative %: 69 %
Platelet Count: 89 10*3/uL — ABNORMAL LOW (ref 150–400)
RBC: 3.59 MIL/uL — ABNORMAL LOW (ref 4.22–5.81)
RDW: 18.2 % — ABNORMAL HIGH (ref 11.5–15.5)
WBC Count: 3.8 10*3/uL — ABNORMAL LOW (ref 4.0–10.5)
nRBC: 0 % (ref 0.0–0.2)

## 2021-02-25 LAB — CMP (CANCER CENTER ONLY)
ALT: 12 U/L (ref 0–44)
AST: 20 U/L (ref 15–41)
Albumin: 3.3 g/dL — ABNORMAL LOW (ref 3.5–5.0)
Alkaline Phosphatase: 458 U/L — ABNORMAL HIGH (ref 38–126)
Anion gap: 7 (ref 5–15)
BUN: 20 mg/dL (ref 8–23)
CO2: 25 mmol/L (ref 22–32)
Calcium: 10.4 mg/dL — ABNORMAL HIGH (ref 8.9–10.3)
Chloride: 107 mmol/L (ref 98–111)
Creatinine: 0.97 mg/dL (ref 0.61–1.24)
GFR, Estimated: >60 mL/min (ref >60)
Glucose, Bld: 114 mg/dL — ABNORMAL HIGH (ref 70–99)
Potassium: 4 mmol/L (ref 3.5–5.1)
Sodium: 139 mmol/L (ref 135–145)
Total Bilirubin: 1.6 mg/dL — ABNORMAL HIGH (ref 0.3–1.2)
Total Protein: 6.7 g/dL (ref 6.5–8.1)

## 2021-02-25 NOTE — Telephone Encounter (Signed)
Pt called with several concerns. Stated messages were left with no return call, previous not from [place] stated calls were returned but was sent directly to voicemail. Returned pt call but also was sent directly to voicemail. Advised pt on vmail to call facility to discuss his concerns. Pt concerns: fells lethargic, wants labs checked to make sure hgb is stable. Also appetite has decreased.

## 2021-03-03 ENCOUNTER — Other Ambulatory Visit: Payer: Self-pay

## 2021-03-03 ENCOUNTER — Telehealth: Payer: Self-pay

## 2021-03-03 DIAGNOSIS — C3431 Malignant neoplasm of lower lobe, right bronchus or lung: Secondary | ICD-10-CM

## 2021-03-03 MED ORDER — BENZONATATE 200 MG PO CAPS
200.0000 mg | ORAL_CAPSULE | Freq: Three times a day (TID) | ORAL | 0 refills | Status: AC | PRN
Start: 2021-03-03 — End: ?

## 2021-03-03 NOTE — Telephone Encounter (Signed)
Pt called requesting a refill of Tessalon caps.  I have discussed this with Dr. Julien Nordmann who gave verbal auth to send this refill.  I have called pt to advise of this. Pt expressed understanding of this information.

## 2021-03-03 NOTE — Telephone Encounter (Signed)
Duplicate message opened in error.

## 2021-03-07 ENCOUNTER — Telehealth: Payer: Self-pay

## 2021-03-07 NOTE — Telephone Encounter (Signed)
Transmission received.

## 2021-03-07 NOTE — Telephone Encounter (Signed)
Manual transmission reviewed, Pt HR is NSR, no recent significant arrythmia logged.   Spoke with pt.  advised that he is not in AF.  Pt reports his symptoms are increased SOB.  He will reach out to Oncology since he recently completed radiation for lung cancer.

## 2021-03-07 NOTE — Telephone Encounter (Signed)
The patient states he is going to send a transmission. He feels like he is in A-fib. I told him once we receive the transmission, I will have the nurse give him a call back at [phone].

## 2021-03-10 ENCOUNTER — Other Ambulatory Visit: Payer: Self-pay

## 2021-03-10 ENCOUNTER — Ambulatory Visit (HOSPITAL_COMMUNITY)
Admission: RE | Admit: 2021-03-10 | Discharge: 2021-03-10 | Disposition: A | Discharge status: Home / Self Care | Payer: Medicare Other | Source: Ambulatory Visit | Attending: Internal Medicine | Admitting: Internal Medicine

## 2021-03-10 ENCOUNTER — Inpatient Hospital Stay: Payer: Medicare Other | Attending: Internal Medicine

## 2021-03-10 DIAGNOSIS — C3490 Malignant neoplasm of unspecified part of unspecified bronchus or lung: Secondary | ICD-10-CM

## 2021-03-10 DIAGNOSIS — C3431 Malignant neoplasm of lower lobe, right bronchus or lung: Secondary | ICD-10-CM | POA: Insufficient documentation

## 2021-03-10 DIAGNOSIS — Z9221 Personal history of antineoplastic chemotherapy: Secondary | ICD-10-CM | POA: Diagnosis not present

## 2021-03-10 DIAGNOSIS — I4891 Unspecified atrial fibrillation: Secondary | ICD-10-CM | POA: Diagnosis not present

## 2021-03-10 DIAGNOSIS — I700 Atherosclerosis of aorta: Secondary | ICD-10-CM | POA: Insufficient documentation

## 2021-03-10 DIAGNOSIS — J90 Pleural effusion, not elsewhere classified: Secondary | ICD-10-CM | POA: Diagnosis not present

## 2021-03-10 DIAGNOSIS — K449 Diaphragmatic hernia without obstruction or gangrene: Secondary | ICD-10-CM | POA: Diagnosis not present

## 2021-03-10 DIAGNOSIS — C7951 Secondary malignant neoplasm of bone: Secondary | ICD-10-CM | POA: Diagnosis not present

## 2021-03-10 DIAGNOSIS — K219 Gastro-esophageal reflux disease without esophagitis: Secondary | ICD-10-CM | POA: Diagnosis not present

## 2021-03-10 DIAGNOSIS — R5383 Other fatigue: Secondary | ICD-10-CM | POA: Insufficient documentation

## 2021-03-10 DIAGNOSIS — R748 Abnormal levels of other serum enzymes: Secondary | ICD-10-CM | POA: Insufficient documentation

## 2021-03-10 DIAGNOSIS — Z79899 Other long term (current) drug therapy: Secondary | ICD-10-CM | POA: Diagnosis not present

## 2021-03-10 DIAGNOSIS — I509 Heart failure, unspecified: Secondary | ICD-10-CM | POA: Diagnosis not present

## 2021-03-10 DIAGNOSIS — J479 Bronchiectasis, uncomplicated: Secondary | ICD-10-CM | POA: Diagnosis not present

## 2021-03-10 LAB — CBC WITH DIFFERENTIAL (CANCER CENTER ONLY)
Abs Immature Granulocytes: 0.04 10*3/uL (ref 0.00–0.07)
Basophils Absolute: 0 10*3/uL (ref 0.0–0.1)
Basophils Relative: 0 %
Eosinophils Absolute: 0 10*3/uL (ref 0.0–0.5)
Eosinophils Relative: 0 %
HCT: 33.6 % — ABNORMAL LOW (ref 39.0–52.0)
Hemoglobin: 10.9 g/dL — ABNORMAL LOW (ref 13.0–17.0)
Immature Granulocytes: 1 %
Lymphocytes Relative: 3 %
Lymphs Abs: 0.2 10*3/uL — ABNORMAL LOW (ref 0.7–4.0)
MCH: 29.2 pg (ref 26.0–34.0)
MCHC: 32.4 g/dL (ref 30.0–36.0)
MCV: 90.1 fL (ref 80.0–100.0)
Monocytes Absolute: 0.6 10*3/uL (ref 0.1–1.0)
Monocytes Relative: 8 %
Neutro Abs: 6.2 10*3/uL (ref 1.7–7.7)
Neutrophils Relative %: 88 %
Platelet Count: 110 10*3/uL — ABNORMAL LOW (ref 150–400)
RBC: 3.73 MIL/uL — ABNORMAL LOW (ref 4.22–5.81)
RDW: 18.6 % — ABNORMAL HIGH (ref 11.5–15.5)
WBC Count: 7 10*3/uL (ref 4.0–10.5)
nRBC: 0 % (ref 0.0–0.2)

## 2021-03-10 LAB — CMP (CANCER CENTER ONLY)
ALT: 26 U/L (ref 0–44)
AST: 27 U/L (ref 15–41)
Albumin: 3.6 g/dL (ref 3.5–5.0)
Alkaline Phosphatase: 576 U/L — ABNORMAL HIGH (ref 38–126)
Anion gap: 8 (ref 5–15)
BUN: 22 mg/dL (ref 8–23)
CO2: 26 mmol/L (ref 22–32)
Calcium: 11.2 mg/dL — ABNORMAL HIGH (ref 8.9–10.3)
Chloride: 103 mmol/L (ref 98–111)
Creatinine: 1 mg/dL (ref 0.61–1.24)
GFR, Estimated: >60 mL/min (ref >60)
Glucose, Bld: 147 mg/dL — ABNORMAL HIGH (ref 70–99)
Potassium: 4.3 mmol/L (ref 3.5–5.1)
Sodium: 137 mmol/L (ref 135–145)
Total Bilirubin: 2.1 mg/dL — ABNORMAL HIGH (ref 0.3–1.2)
Total Protein: 7.3 g/dL (ref 6.5–8.1)

## 2021-03-10 IMAGING — CT CT CHEST W/ CM
2 of 4 series · 14 of 36 positions shown, 17 images · IV contrast (omnipaque)
Comparison: Chest CT [DATE].  PET-CT [DATE].

CLINICAL DATA: 80-year-old male with history of cough for the past
3 months. History of small cell lung cancer with known brain
metastasis. Chemotherapy complete in [DATE]. Radiation
therapy complete in [DATE].

EXAM:
CT CHEST WITH CONTRAST
TECHNIQUE: Multidetector CT imaging of the chest was performed during
intravenous contrast administration.
CONTRAST:  65mL OMNIPAQUE IOHEXOL 350 MG/ML SOLN

[Series 2: axial st · axial · 0.85mm/px · z∈[-68,+232]mm · 11 of 178 slices shown, 14 images]
[im 14/178  mediastinal]
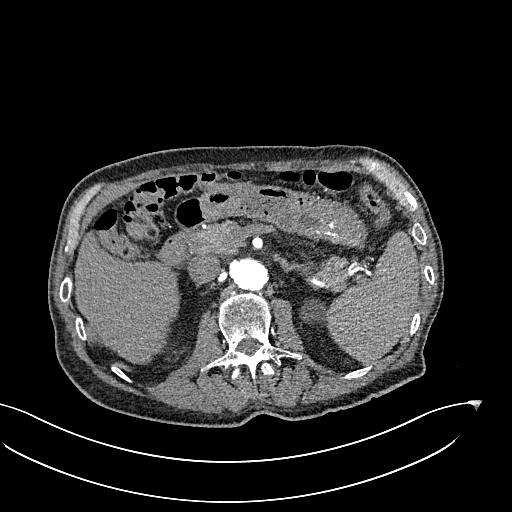
[im 14/178  lung]
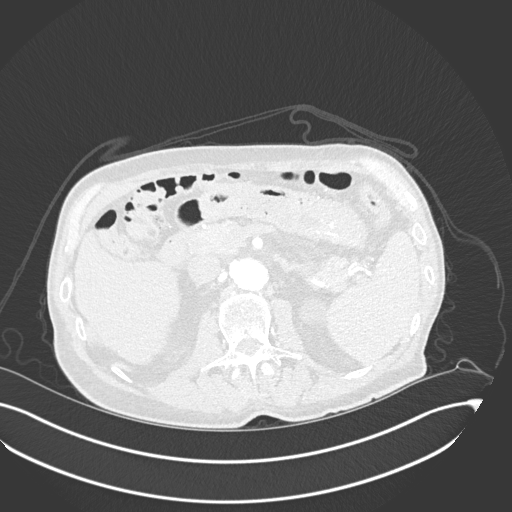
[im 28/178  lung]
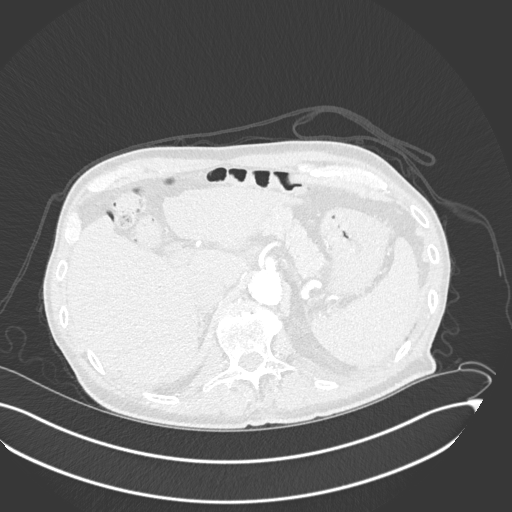
[im 41/178  lung]
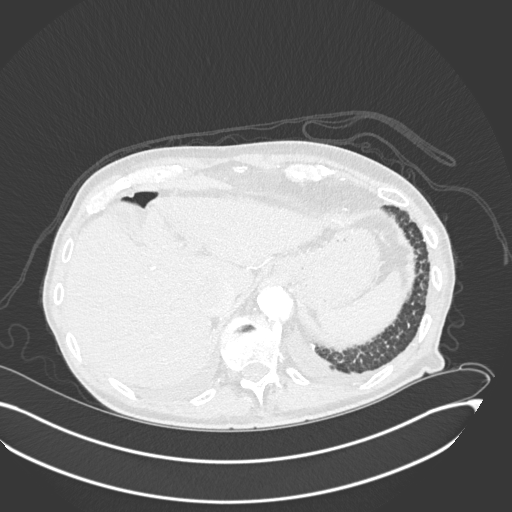
[im 55/178  lung]
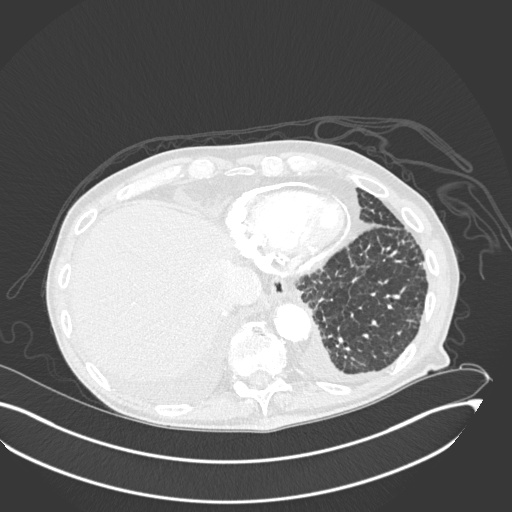
[im 69/178  mediastinal]
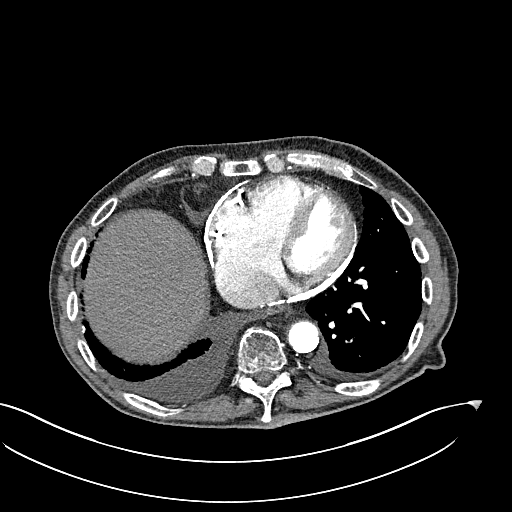
[im 69/178  lung]
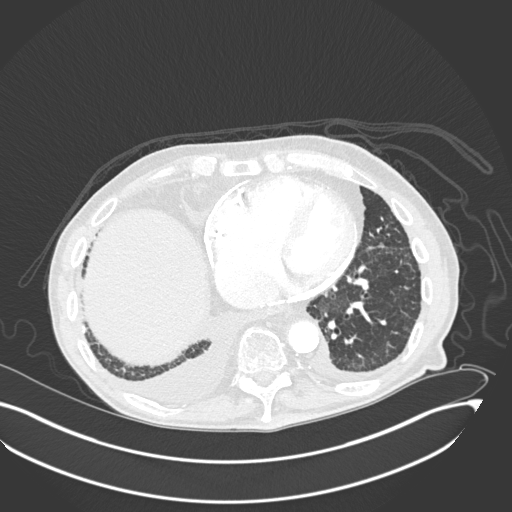
[im 96/178  lung]
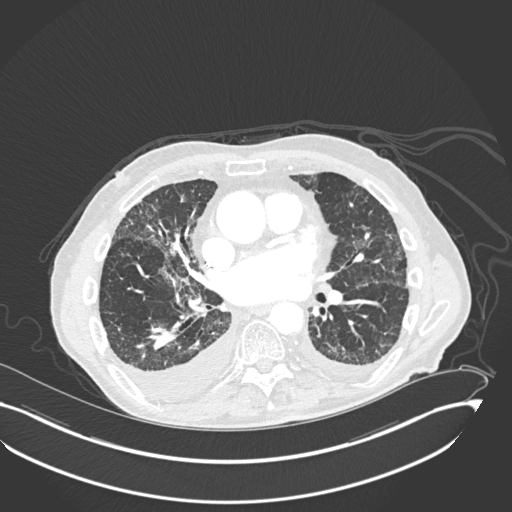
[im 109/178  lung]
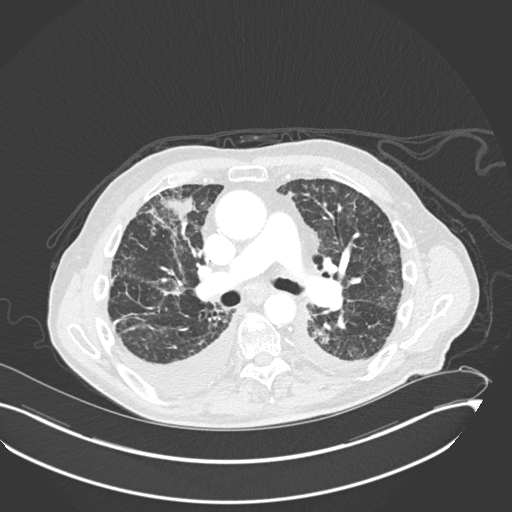
[im 123/178  lung]
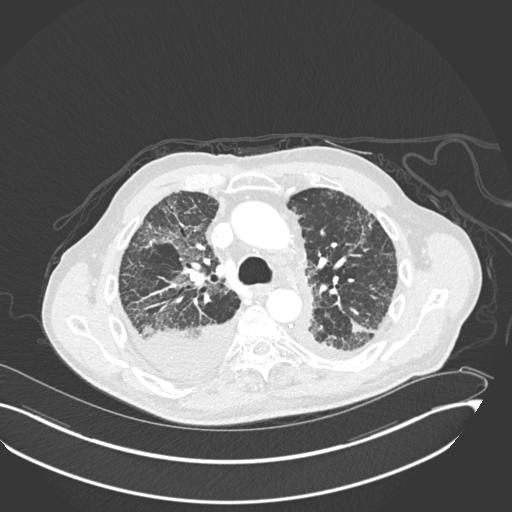
[im 137/178  mediastinal]
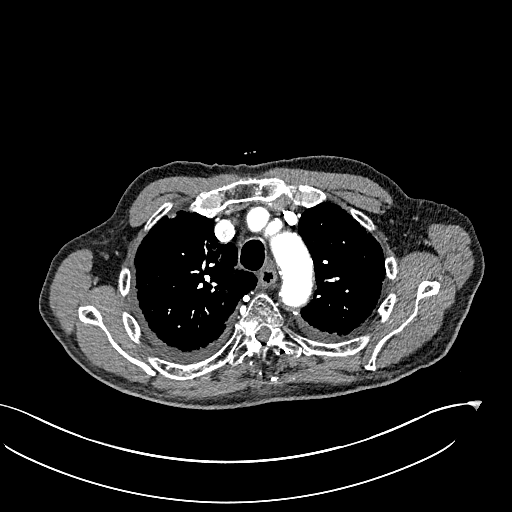
[im 137/178  lung]
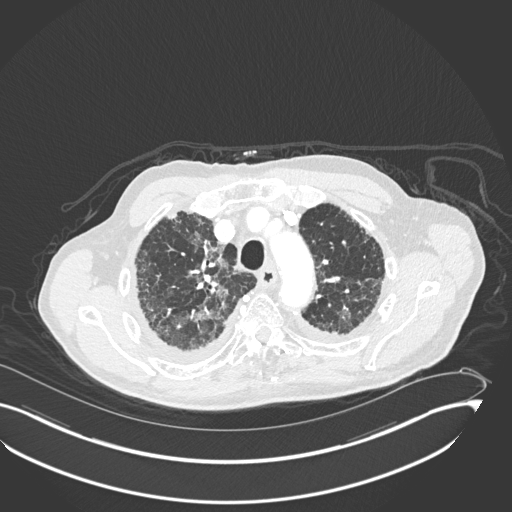
[im 150/178  lung]
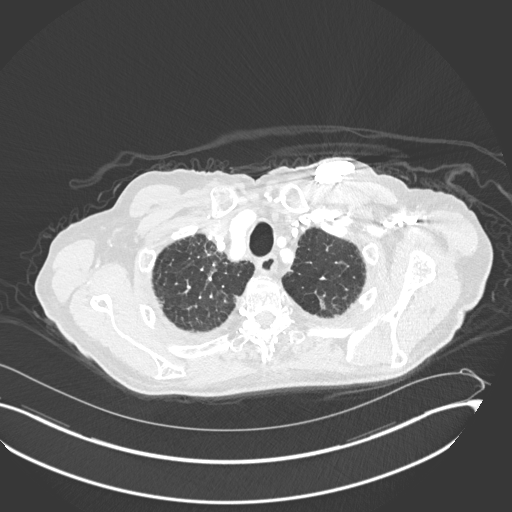
[im 164/178  lung]
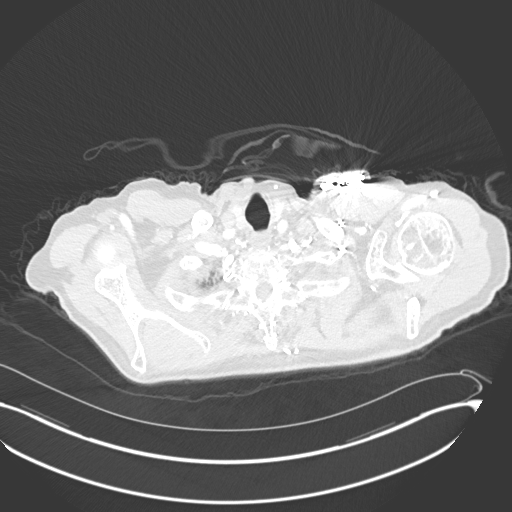

[Series 6: coronal · coronal · 0.77mm/px · 3 of 145 slices shown]
[im 29/145  lung]
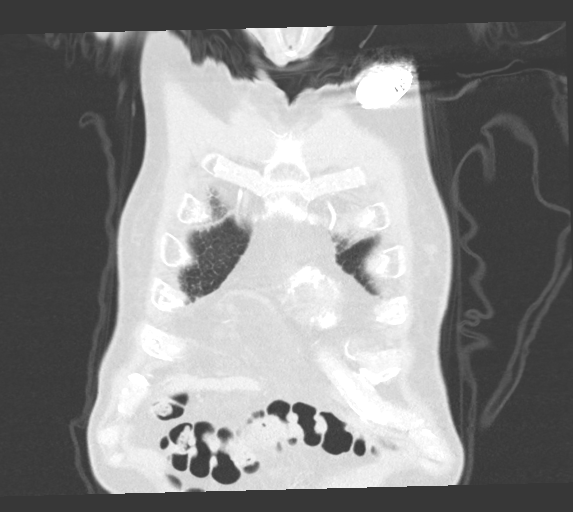
[im 58/145  lung]
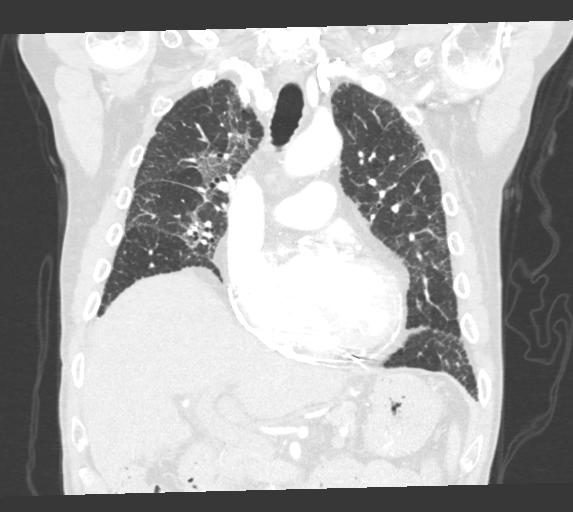
[im 87/145  lung]
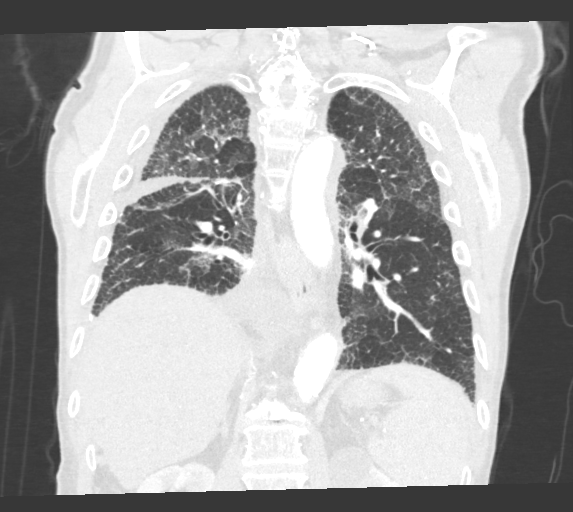

[14 of 36 positions shown; findings below may reference images not displayed]

FINDINGS: Cardiovascular: Heart size is normal. Extensive pericardial
calcification. There is aortic atherosclerosis, as well as
atherosclerosis of the great vessels of the mediastinum and the
coronary arteries, including calcified atherosclerotic plaque in the
left main, left anterior descending, left circumflex and right
coronary arteries. Ectasia of ascending thoracic aorta (4.2 cm in
diameter). Left-sided pacemaker device in place with lead tips
terminating in the right atrium and right ventricular apex.

Mediastinum/Nodes: No pathologically enlarged mediastinal or hilar
lymph nodes. Esophagus is unremarkable in appearance. No axillary
lymphadenopathy.

Lungs/Pleura: Postoperative changes of wedge resection in the
superior segment of the right lower lobe. No definite suspicious
appearing pulmonary nodules or masses are noted. Widespread areas of
ground-glass attenuation, septal thickening, subpleural reticulation
and scattered areas of cylindrical bronchiectasis are noted in the
lungs bilaterally with no discernible craniocaudal gradient. These
fibrotic changes appear mildly progressive compared to prior
examinations. Moderate right and small left pleural effusions lying
dependently.

Upper Abdomen: Incompletely imaged 3.4 cm low-attenuation lesion in
the upper pole of the left kidney, likely a simple cyst.

Musculoskeletal: Chronic compression fracture of T11 with 70% loss
of anterior vertebral body height. There are no aggressive appearing
lytic or blastic lesions noted in the visualized portions of the
skeleton.
IMPRESSION: 1. No definitive findings to suggest recurrent disease in the
thorax.
2. Moderate right and small left pleural effusions lying
dependently.
3. The appearance of the lungs suggest progressive worsening
interstitial lung disease, as above. Outpatient referral to
Pulmonology for further clinical evaluation is recommended.
4. Severe pericardial calcifications redemonstrated. Correlation
with nonemergent echocardiography is recommended to exclude
constrictive physiology.
5. Aortic atherosclerosis, in addition to left main and 3 vessel
coronary artery disease.
6. Ectasia of ascending thoracic aorta (4.2 cm in diameter).
Recommend annual imaging followup by CTA or MRA. This recommendation
follows [YZ] ACCF/AHA/AATS/ACR/ASA/SCA/ISABELITAP/ISABELITAP/ISABELITAP/ISABELITAP Guidelines
for the Diagnosis and Management of Patients with Thoracic Aortic
Disease. Circulation. [YZ]; 121: [id number]. Aortic aneurysm NOS
([YZ]-[YZ]).

## 2021-03-10 MED ORDER — IOHEXOL 350 MG/ML SOLN
65.0000 mL | Freq: Once | INTRAVENOUS | Status: AC | PRN
  Administered 2021-03-10: 65 mL via INTRAVENOUS

## 2021-03-12 ENCOUNTER — Other Ambulatory Visit: Payer: Self-pay

## 2021-03-12 ENCOUNTER — Inpatient Hospital Stay (HOSPITAL_BASED_OUTPATIENT_CLINIC_OR_DEPARTMENT_OTHER): Payer: Medicare Other | Admitting: Internal Medicine

## 2021-03-12 VITALS — BP 125/71 | HR 82 | Temp 97.7°F | Ht 69.0 in | Wt 155.6 lb

## 2021-03-12 DIAGNOSIS — C3490 Malignant neoplasm of unspecified part of unspecified bronchus or lung: Secondary | ICD-10-CM

## 2021-03-12 DIAGNOSIS — R748 Abnormal levels of other serum enzymes: Secondary | ICD-10-CM | POA: Diagnosis not present

## 2021-03-12 DIAGNOSIS — R5383 Other fatigue: Secondary | ICD-10-CM | POA: Diagnosis not present

## 2021-03-12 DIAGNOSIS — C3431 Malignant neoplasm of lower lobe, right bronchus or lung: Secondary | ICD-10-CM

## 2021-03-12 DIAGNOSIS — I4891 Unspecified atrial fibrillation: Secondary | ICD-10-CM | POA: Diagnosis not present

## 2021-03-12 DIAGNOSIS — C7951 Secondary malignant neoplasm of bone: Secondary | ICD-10-CM | POA: Diagnosis not present

## 2021-03-12 MED ORDER — PREDNISONE 5 MG PO TABS: ORAL_TABLET | ORAL | 0 refills | Status: DC

## 2021-03-12 NOTE — Progress Notes (Signed)
[facility] [phone]   [phone]  OFFICE PROGRESS NOTE  [name], MD [place] [address]  DIAGNOSIS: Stage IB (T2 a, N0, M0) small cell lung cancer presented with superior segment right lower lobe lung nodule.  PRIOR THERAPY:  1) Status post right lower lobe superior segmentectomy with lymph node sampling on March 12, 2020 under the care of Dr. Servando Snare. 2) Systemic chemotherapy with carboplatin for AUC of 5 on day 1 and etoposide 100 mg/M2 on days 1, 2 and 3 with Neulasta support every 3 weeks.  Status post 4 cycles.  CURRENT THERAPY: Observation.  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 male returns to the clinic today for follow-up visit.  The patient is feeling fine today with no concerning complaints except for fatigue that started getting better after starting prednisone by his primary care physician Dr. Sharlett Iles.  The patient denied having any current chest pain but has shortness of breath with exertion with no cough or hemoptysis.  He has no nausea, vomiting, diarrhea or constipation.  He has no headache or visual changes.  He has no significant recent weight loss or night sweats.  He is here today for evaluation with repeat CT scan of the chest for restaging of his disease.  MEDICAL HISTORY: Past Medical History:  Diagnosis Date   Allergy    Arthritis    "right knee and right thumb" (12/19/2014)   Asthma    pt denies   Atrial fibrillation ([facility]) 2021   Bronchitis 07/2016   started in December and continued about 2 months   Central retinal artery occlusion 06/26/2013   CHF (congestive heart failure) ([facility]) 08/24/2020   Esophageal stricture    GERD (gastroesophageal reflux disease)    Hemochromatosis    Hiatal hernia    Hypercalcemia    Hypercholesteremia    Hypertension    Internal hemorrhoids    Lung cancer ([name])    Osteoarthritis    Palpitations    Persistent atrial fibrillation ([name])     Presence of permanent cardiac pacemaker    hx bradycardia   Retinal artery occlusion, branch    "right eye"   Second degree AV block, Mobitz type II    Skin cancer    right shoulder   Small cell lung cancer, right lower lobe ([facility]) 03/12/2020   Stroke ([facility]) ~ 2011   "right eye"/ partial blindness   Tubular adenoma of colon     ALLERGIES:  is allergic to lisinopril.  MEDICATIONS:  Current Outpatient Medications  Medication Sig Dispense Refill   acetaminophen (TYLENOL) 500 MG tablet Take 500-1,000 mg by mouth every 6 (six) hours as needed for moderate pain.     albuterol (VENTOLIN HFA) 108 (90 Base) MCG/ACT inhaler Inhale 1 puff into the lungs every 6 (six) hours as needed for wheezing or shortness of breath. 8 g 2   atorvastatin (LIPITOR) 20 MG tablet TAKE 1 TABLET(20 MG) BY MOUTH DAILY 90 tablet 0   benzonatate (TESSALON) 200 MG capsule Take 1 capsule (200 mg total) by mouth 3 (three) times daily as needed for cough. 135 capsule 0   celecoxib (CELEBREX) 200 MG capsule Take 1 capsule (200 mg total) by mouth 2 (two) times daily as needed for mild pain. 10 capsule 0   COVID-19 mRNA Vac-TriS, Pfizer, (PFIZER-BIONT COVID-19 VAC-TRIS) SUSP injection Inject into the muscle. 0.3 mL 0   dofetilide (TIKOSYN) 250 MCG capsule Take 1 capsule (250 mcg total)  by mouth 2 (two) times daily. 60 capsule 6   doxazosin (CARDURA) 4 MG tablet Take 4 mg by mouth daily.      esomeprazole (NEXIUM) 20 MG capsule Take 20 mg by mouth daily before breakfast.     fluticasone (FLONASE) 50 MCG/ACT nasal spray Place 1 spray into both nostrils daily.     Glycerin-Hypromellose-PEG 400 (DRY EYE RELIEF DROPS) 0.2-0.2-1 % SOLN Place 1 drop into both eyes daily as needed (for dryness). Saline     HYDROcodone-homatropine (HYCODAN) 5-1.5 MG/5ML syrup Take 5 mLs by mouth every 6 (six) hours as needed for cough.     memantine (NAMENDA) 10 MG tablet Take 1 tablet (10 mg total) by mouth 2 (two) times daily. 60 tablet 4   memantine  (NAMENDA) 5 MG tablet Begin this prescription the first day of brain radiation. Week 1: take one tablet po qam. Week 2: take one tablet qam and qpm. Week 3: take two tablets qam, and one tablet po q pm. Week 4: take two tablets qam and qpm. Fill subsequent prescription q month. 70 tablet 0   metoprolol succinate (TOPROL-XL) 25 MG 24 hr tablet Take 0.5 tablets (12.5 mg total) by mouth at bedtime. 45 tablet 1   montelukast (SINGULAIR) 10 MG tablet Take 10 mg by mouth every morning.      Multiple Vitamins-Minerals (PRESERVISION AREDS 2 PO) Take 1 capsule by mouth in the morning and at bedtime.      potassium chloride SA (KLOR-CON) 20 MEQ tablet Take 41meq (4 tablets by mouth) on Monday, Wednesday and Friday - the days you take your diuretic, Torsemide.  Take 28meq (1 tablet by mouth) on the days you do not take your diuretic. (Patient taking differently: Take 20-80 mEq by mouth See admin instructions. Take 80 meq by mouth on Monday, Wednesday and Friday and 20 meq on Tuesday, Thursday, Saturday and Sunday) 90 tablet 3   rivaroxaban (XARELTO) 20 MG TABS tablet Take 1 tablet (20 mg total) by mouth daily with supper. 30 tablet 11   sucralfate (CARAFATE) 1 g tablet Take 1 tablet (1 g total) by mouth 4 (four) times daily -  with meals and at bedtime. Crush 1 tablet in 1 oz water and drink 5 min before meals for radiation induced esophagitis 120 tablet 2   torsemide (DEMADEX) 20 MG tablet TAKE 2 TABLETS(40 MG) BY MOUTH DAILY 60 tablet 2   triamcinolone 0.1% oint-Cerave equivalent lotion 1:1 mixture Apply topically 2 (two) times daily as needed. 454 g 0   triamcinolone cream (KENALOG) 0.1 % Apply 1 application topically 2 (two) times daily. 30 g 1   vitamin C (ASCORBIC ACID) 500 MG tablet Take 500 mg by mouth at bedtime.      No current facility-administered medications for this visit.    SURGICAL HISTORY:  Past Surgical History:  Procedure Laterality Date   CARDIOVERSION N/A 07/29/2020   Procedure:  CARDIOVERSION;  Surgeon: Jerline Pain, MD;  Location: [facility];  Service: Cardiovascular;  Laterality: N/A;  NEEDS RAPID COVID TEST MORNING OF   CARDIOVERSION N/A 08/19/2020   Procedure: CARDIOVERSION;  Surgeon: Thayer Headings, MD;  Location: [facility];  Service: Cardiovascular;  Laterality: N/A;   CATARACT EXTRACTION, BILATERAL  01/2017   COLONOSCOPY     EP IMPLANTABLE DEVICE N/A 12/19/2014   Procedure: Pacemaker Implant;  Surgeon: Deboraha Sprang, MD;  Location: [facility];  Service: Cardiovascular;  Laterality: N/A;   ESOPHAGOGASTRODUODENOSCOPY (EGD) WITH ESOPHAGEAL DILATION  X  3   EYE SURGERY Bilateral 2017   INGUINAL HERNIA REPAIR Left 1980's   INSERT / REPLACE / REMOVE PACEMAKER  12/19/2014   INTERCOSTAL NERVE BLOCK Right 03/11/2020   Procedure: INTERCOSTAL NERVE BLOCK;  Surgeon: Grace Isaac, MD;  Location: [facility];  Service: Thoracic;  Laterality: Right;   JOINT REPLACEMENT Right 2016   KNEE ARTHROSCOPY Right ~ 1982; ~ [facility] Right ~ 2014   "pre-melanoma scapula"   POSTERIOR TIBIAL TENDON REPAIR Left 2012   TONSILLECTOMY  1946   TOTAL KNEE ARTHROPLASTY Right 05/20/2015   Procedure: RIGHT TOTAL KNEE ARTHROPLASTY;  Surgeon: Paralee Cancel, MD;  Location: [facility];  Service: Orthopedics;  Laterality: Right;   UPPER GASTROINTESTINAL ENDOSCOPY     VIDEO BRONCHOSCOPY N/A 03/11/2020   Procedure: VIDEO BRONCHOSCOPY;  Surgeon: Grace Isaac, MD;  Location: [facility];  Service: Thoracic;  Laterality: N/A;   VIDEO BRONCHOSCOPY WITH ENDOBRONCHIAL ULTRASOUND N/A 11/18/2020   Procedure: VIDEO BRONCHOSCOPY WITH ENDOBRONCHIAL ULTRASOUND;  Surgeon: Melrose Nakayama, MD;  Location: [facility];  Service: Thoracic;  Laterality: N/A;   XI ROBOTIC ASSISTED THORACOSCOPY- SEGMENTECTOMY Right 03/11/2020   Procedure: XI ROBOTIC ASSISTED THORACOSCOPY-RIGHT SUPERIOR SEGMENTECTOMY WITH NODE SAMPLES;  Surgeon: Grace Isaac, MD;  Location: [facility];  Service: Thoracic;  Laterality:  Right;    REVIEW OF SYSTEMS:  Constitutional: positive for fatigue Eyes: negative Ears, nose, mouth, throat, and face: negative Respiratory: positive for dyspnea on exertion Cardiovascular: negative Gastrointestinal: negative Genitourinary:negative Integument/breast: negative Hematologic/lymphatic: negative Musculoskeletal:negative Neurological: negative Behavioral/Psych: negative Endocrine: negative Allergic/Immunologic: negative   PHYSICAL EXAMINATION: General appearance: alert, cooperative, fatigued, and no distress Head: Normocephalic, without obvious abnormality, atraumatic Neck: no adenopathy, no JVD, supple, symmetrical, trachea midline, and thyroid not enlarged, symmetric, no tenderness/mass/nodules Lymph nodes: Cervical, supraclavicular, and axillary nodes normal. Resp: clear to auscultation bilaterally Back: symmetric, no curvature. ROM normal. No CVA tenderness. Cardio: regular rate and rhythm, S1, S2 normal, no murmur, click, rub or gallop GI: soft, non-tender; bowel sounds normal; no masses,  no organomegaly Extremities: extremities normal, atraumatic, no cyanosis or edema Neurologic: Alert and oriented X 3, normal strength and tone. Normal symmetric reflexes. Normal coordination and gait  ECOG PERFORMANCE STATUS: 1 - Symptomatic but completely ambulatory  Blood pressure 125/71, pulse 82, temperature 97.7 F (36.5 C), temperature source Oral, height 5\' 9"  (1.753 m), weight 155 lb 9.6 oz (70.6 kg), SpO2 94 %.  LABORATORY DATA: Lab Results  Component Value Date   WBC 7.0 03/10/2021   HGB 10.9 (L) 03/10/2021   HCT 33.6 (L) 03/10/2021   MCV 90.1 03/10/2021   PLT 110 (L) 03/10/2021      Chemistry      Component Value Date/Time   NA 137 03/10/2021 1251   NA 142 10/03/2020 1340   K 4.3 03/10/2021 1251   CL 103 03/10/2021 1251   CO2 26 03/10/2021 1251   BUN 22 03/10/2021 1251   BUN 18 10/03/2020 1340   CREATININE 1.00 03/10/2021 1251      Component Value  Date/Time   CALCIUM 11.2 (H) 03/10/2021 1251   ALKPHOS 576 (H) 03/10/2021 1251   AST 27 03/10/2021 1251   ALT 26 03/10/2021 1251   BILITOT 2.1 (H) 03/10/2021 1251       RADIOGRAPHIC STUDIES: CT Chest W Contrast  Result Date: 03/11/2021 CLINICAL DATA:  80 year old male with history of cough for the past 3 months. 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 Chemotherapy complete in November  2021. Radiation therapy complete in July 2022. EXAM: CT CHEST WITH CONTRAST TECHNIQUE: Multidetector CT imaging of the chest was performed during intravenous contrast administration. CONTRAST:  28mL OMNIPAQUE IOHEXOL 350 MG/ML SOLN COMPARISON:  Chest CT 10/14/2020.  PET-CT 10/31/2020. FINDINGS: Cardiovascular: Heart size is normal. Extensive pericardial calcification. There is aortic atherosclerosis, as well as atherosclerosis of the great vessels of the mediastinum and the coronary arteries, including calcified atherosclerotic plaque in the left main, left anterior descending, left circumflex and right coronary arteries. Ectasia of ascending thoracic aorta (4.2 cm in diameter). Left-sided pacemaker device in place with lead tips terminating in the right atrium and right ventricular apex. Mediastinum/Nodes: No pathologically enlarged mediastinal or hilar lymph nodes. Esophagus is unremarkable in appearance. No axillary lymphadenopathy. Lungs/Pleura: Postoperative changes of wedge resection in the superior segment of the right lower lobe. No definite suspicious appearing pulmonary nodules or masses are noted. Widespread areas of ground-glass attenuation, septal thickening, subpleural reticulation and scattered areas of cylindrical bronchiectasis are noted in the lungs bilaterally with no discernible craniocaudal gradient. These fibrotic changes appear mildly progressive compared to prior examinations. Moderate right and small left pleural effusions lying dependently. Upper Abdomen: Incompletely imaged  3.4 cm low-attenuation lesion in the upper pole of the left kidney, likely a simple cyst. Musculoskeletal: Chronic compression fracture of T11 with 70% loss of anterior vertebral body height. There are no aggressive appearing lytic or blastic lesions noted in the visualized portions of the skeleton. IMPRESSION: 1. No definitive findings to suggest recurrent disease in the thorax. 2. Moderate right and small left pleural effusions lying dependently. 3. The appearance of the lungs suggest progressive worsening interstitial lung disease, as above. Outpatient referral to Pulmonology for further clinical evaluation is recommended. 4. Severe pericardial calcifications redemonstrated. Correlation with nonemergent echocardiography is recommended to exclude constrictive physiology. 5. Aortic atherosclerosis, in addition to left main and 3 vessel coronary artery disease. 6. Ectasia of ascending thoracic aorta (4.2 cm in diameter). Recommend annual imaging followup by CTA or MRA. This recommendation follows 2010 ACCF/AHA/AATS/ACR/ASA/SCA/SCAI/SIR/STS/SVM Guidelines for the Diagnosis and Management of Patients with Thoracic Aortic Disease. Circulation. 2010; 121: I712-W580. Aortic aneurysm NOS (ICD10-I71.9). Electronically Signed   By: Vinnie Langton M.D.   On: 03/11/2021 12:58     ASSESSMENT AND PLAN: This is a very pleasant 80 years old white male diagnosed with a stage Ib (T2 a, N0, M0) small cell lung cancer presented with superior segment right lower lobe nodule status post right lower lobe superior segmentectomy with lymph node dissection in August 2021 under the care of Dr. Servando Snare. The patient completed a course of adjuvant treatment with systemic chemotherapy with carboplatin for AUC of 5 on day 1 and etoposide 100 mg/M2 on days 1, 2 and 3 with Neulasta support.  He status post 4 cycles. The patient is currently on observation and he is feeling fine except for fatigue and intermittent right upper quadrant  abdominal pain. His scan showed interval decrease in the size of the small bilateral pleural effusions that was seen previously but there was interval progression of right hilar and subcarinal lymph nodes mildly enlarged with.  There was a stable compression fracture of T7 and T11. His PET scan showed no concerning evidence for metastasis except for hypermetabolic right hilar lymph node that still suspicious for malignancy.  The patient underwent repeat bronchoscopy with endobronchial ultrasound which confirmed the recurrence of his small cell lung cancer. He was treated with palliative radiotherapy to the chest under the care of Dr.  Moody. The patient is currently on observation and he is feeling fine except for the fatigue and shortness of breath with exertion. He had repeat CT scan of the chest performed recently.  I personally and independently reviewed the scans and discussed the results with the patient today. Has a scan showed no concerning findings for disease recurrence or progression but there was progressive worsening of interstitial lung disease.  The patient felt better on the recent treatment with the steroid by his primary care physician. I recommended for him to start a tapered dose of prednisone over the next 4 weeks.  I will give him the prescription. He will also reach out to his pulmonologist for consideration of pulmonary rehab. I will see the patient back for follow-up visit in 3 months for evaluation with repeat CT scan of the chest, abdomen pelvis for restaging of his disease. For the hemochromatosis, we will continue to monitor closely and may consider The patient for phlebotomy if needed. For the elevated alkaline phosphatase and serum bilirubin, we will continue to monitor it closely.  There is no clear evidence for disease metastasis in the liver but the patient has a lot of degenerative bone disease. He was advised to call immediately if he has any other concerning symptoms  in the interval. The patient voices understanding of current disease status and treatment options and is in agreement with the current care plan.  All questions were answered. The patient knows to call the clinic with any problems, questions or concerns. We can certainly see the patient much sooner if necessary.  Disclaimer: This note was dictated with voice recognition software. Similar sounding words can inadvertently be transcribed and may not be corrected upon review.

## 2021-03-14 ENCOUNTER — Encounter: Payer: Self-pay | Admitting: Pulmonary Disease

## 2021-03-14 ENCOUNTER — Ambulatory Visit (INDEPENDENT_AMBULATORY_CARE_PROVIDER_SITE_OTHER): Payer: Medicare Other | Admitting: Pulmonary Disease

## 2021-03-14 ENCOUNTER — Other Ambulatory Visit: Payer: Self-pay | Admitting: *Deleted

## 2021-03-14 ENCOUNTER — Other Ambulatory Visit: Payer: Self-pay

## 2021-03-14 VITALS — BP 118/68 | HR 85 | Ht 69.0 in | Wt 156.2 lb

## 2021-03-14 DIAGNOSIS — C3431 Malignant neoplasm of lower lobe, right bronchus or lung: Secondary | ICD-10-CM

## 2021-03-14 DIAGNOSIS — R0600 Dyspnea, unspecified: Secondary | ICD-10-CM | POA: Diagnosis not present

## 2021-03-14 DIAGNOSIS — R0609 Other forms of dyspnea: Secondary | ICD-10-CM

## 2021-03-14 MED ORDER — PREDNISONE 20 MG PO TABS: 40.0000 mg | ORAL_TABLET | Freq: Every day | ORAL | 0 refills | Status: DC

## 2021-03-14 MED ORDER — ATORVASTATIN CALCIUM 20 MG PO TABS: ORAL_TABLET | ORAL | 1 refills | Status: DC

## 2021-03-14 NOTE — Progress Notes (Signed)
@Patient  ID: Alexander Murray, Dr., male    DOB: May 30, 1941, 80 y.o.   MRN: [id number]  Chief Complaint  Patient presents with   Consult    Per patient, he finished radiation on July 1st and has noticed some increased SOB. Referred by Dr. Julien Nordmann.     Referring provider: [name], MD  HPI:   80 y.o. man status post wedge resection lung cancer summer 2021 with concern for possible local recurrence and mediastinal lymph nodes spring 2022 status post radiation therapy ended June/July 2022 here with worsening dyspnea on exertion.  Multiple radiation oncology notes reviewed.  PCP note reviewed.  Patient notes going to radiation Therapy throughout the late spring early summer 2022.  Completed radiation to the chest mid June 2022 and whole brain early July 2022.  He notes marked decrease in activity during these treatments.  A lot of this due to fatigue, feeling rundown he thinks in large part due to radiation.  Starting a few weeks ago, early to mid July, he noted worsening dyspnea on exertion.  Feels minimal exertion yields significant dyspnea.  No cough.  Rest makes it better.  Not using inhalers.  Has albuterol in the past but has not thought to use it.  No time of day where things are better or worse.  No environmental factors identified that could be contributing.  No position to make things better or worse.  Seen by his PCP couple weeks ago started on prednisone therapy, 40 mg daily times a week now on 20 mg daily.  Seems there is concern for radiation pneumonitis.  I see no imaging to support this at the time of visit.  He did have a CT scan 03/12/2021 surveillance with his oncologist.  On my review and interpretation the CT reveals increased interstitial or interlobular septal thickening most prominent in the periphery there is more prominent compared to prior scans both PET and CT chest 10/2020 as well as mild interlobular septal thickening there is more prominent centrally but relatively  diffusely distributed with increased size of bilateral pleural effusions in the been chronic in nature on serial CT scans.  In terms of response to prednisone therapy is not sure its helped his breathing much.  Maybe marginally.  No significant improvement.  Dyspnea seems largely unchanged.  He does note that the prednisone has helped his energy in the mid-to-late afternoon and evening.  He feels more get up and go, less lethargic, tired in the evenings.  His appetite has increased and improved as well.  Most recent TTE 12/2014 with moderately dilated LA, normal LV function,, trivial mitral valve regurgitation.  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  MMRC: No flowsheet data found.  Epworth:  No flowsheet data found.  Tests:   FENO:  No results found for: NITRICOXIDE  PFT: PFT Results Latest Ref Rng & Units 02/23/2020  FVC-Pre L 3.07  FVC-Predicted Pre % 76  FVC-Post L 3.02  FVC-Predicted Post % 75  Pre FEV1/FVC % % 86  Post FEV1/FCV % % 89  FEV1-Pre L 2.63  FEV1-Predicted Pre % 93  FEV1-Post L 2.70  DLCO uncorrected ml/min/mmHg  14.25  DLCO UNC% % 58  DLVA Predicted % 79  TLC L 5.12  TLC % Predicted % 72  RV % Predicted % 75  Presegmentectomy spirometry suggestive of mild restriction versus gas trapping, TLC is mildly to moderately reduced confirming restriction, DLCO moderately reduced  WALK:  No flowsheet data found.  Imaging: Personally reviewed and as per EMR discussion this note CT Chest W Contrast  Result Date:  03/11/2021 CLINICAL DATA:  80 year old male with history of cough for the past 3 months. 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 Chemotherapy complete in November 2021. Radiation therapy complete in July 2022. EXAM: CT CHEST WITH CONTRAST TECHNIQUE: Multidetector CT imaging of the chest was performed during intravenous contrast administration. CONTRAST:  35mL OMNIPAQUE IOHEXOL 350 MG/ML SOLN COMPARISON:  Chest CT 10/14/2020.  PET-CT 10/31/2020. FINDINGS: Cardiovascular: Heart size is normal. Extensive pericardial calcification. There is aortic atherosclerosis, as well as atherosclerosis of the great vessels of the mediastinum and the coronary arteries, including calcified atherosclerotic plaque in the left main, left anterior descending, left circumflex and right coronary arteries. Ectasia of ascending thoracic aorta (4.2 cm in diameter). Left-sided pacemaker device in place with lead tips terminating in the right atrium and right ventricular apex. Mediastinum/Nodes: No pathologically enlarged mediastinal or hilar lymph nodes. Esophagus is unremarkable in appearance. No axillary lymphadenopathy. Lungs/Pleura: Postoperative changes of wedge resection in the superior segment of the right lower lobe. No definite suspicious appearing pulmonary nodules or masses are noted. Widespread areas of ground-glass attenuation, septal thickening, subpleural reticulation and scattered areas of cylindrical bronchiectasis are noted in the lungs bilaterally with no discernible craniocaudal gradient. These fibrotic changes appear mildly progressive compared to prior examinations. Moderate right and small left pleural effusions lying dependently. Upper Abdomen: Incompletely imaged 3.4 cm low-attenuation lesion in the upper pole of the left kidney, likely a simple cyst. Musculoskeletal: Chronic compression fracture of T11 with 70% loss of anterior vertebral body height. There are no aggressive appearing lytic or  blastic lesions noted in the visualized portions of the skeleton. IMPRESSION: 1. No definitive findings to suggest recurrent disease in the thorax. 2. Moderate right and small left pleural effusions lying dependently. 3. The appearance of the lungs suggest progressive worsening interstitial lung disease, as above. Outpatient referral to Pulmonology for further clinical evaluation is recommended. 4. Severe pericardial calcifications redemonstrated. Correlation with nonemergent echocardiography is recommended to exclude constrictive physiology. 5. Aortic atherosclerosis, in addition to left main and 3 vessel coronary artery disease. 6. Ectasia of ascending thoracic aorta (4.2 cm in diameter). Recommend annual imaging followup by CTA or MRA. This recommendation follows 2010 ACCF/AHA/AATS/ACR/ASA/SCA/SCAI/SIR/STS/SVM Guidelines for the Diagnosis and Management of Patients with Thoracic Aortic Disease. Circulation. 2010; 121: J497-W263. Aortic aneurysm NOS (ICD10-I71.9). Electronically Signed   By: Vinnie Langton M.D.   On: 03/11/2021 12:58    Lab Results: Personally reviewed CBC    Component Value Date/Time   WBC 7.0 03/10/2021 1251   WBC 5.4 11/15/2020 1345   RBC 3.73 (L) 03/10/2021 1251   HGB 10.9 (L) 03/10/2021 1251   HGB 11.7 (L) 07/23/2020 1026   HCT 33.6 (L) 03/10/2021 1251   HCT 34.3 (L) 07/23/2020 1026   PLT 110 (L) 03/10/2021 1251   PLT 90 (LL) 07/23/2020 1026   MCV 90.1 03/10/2021 1251   MCV 95 07/23/2020 1026   MCH 29.2 03/10/2021 1251   MCHC 32.4 03/10/2021 1251   RDW 18.6 (H) 03/10/2021 1251   RDW 15.0 07/23/2020 1026  LYMPHSABS 0.2 (L) 03/10/2021 1251   LYMPHSABS 0.8 07/23/2020 1026   MONOABS 0.6 03/10/2021 1251   EOSABS 0.0 03/10/2021 1251   EOSABS 0.2 07/23/2020 1026   BASOSABS 0.0 03/10/2021 1251   BASOSABS 0.1 07/23/2020 1026    BMET    Component Value Date/Time   NA 137 03/10/2021 1251   NA 142 10/03/2020 1340   K 4.3 03/10/2021 1251   CL 103 03/10/2021 1251    CO2 26 03/10/2021 1251   GLUCOSE 147 (H) 03/10/2021 1251   BUN 22 03/10/2021 1251   BUN 18 10/03/2020 1340   CREATININE 1.00 03/10/2021 1251   CALCIUM 11.2 (H) 03/10/2021 1251   GFRNONAA >60 03/10/2021 1251   GFRAA 81 07/23/2020 1026   GFRAA >60 05/06/2020 0912    BNP    Component Value Date/Time   BNP 976.1 (H) 08/25/2020 0046    ProBNP    Component Value Date/Time   PROBNP 471 10/03/2020 1340    Specialty Problems       Pulmonary Problems   Small cell lung cancer, right lower lobe ([name])    Allergies  Allergen Reactions   Lisinopril Palpitations and Other (See Comments)    Dizziness, also    Immunization History  Administered Date(s) Administered   Influenza Split 06/16/2011, 05/17/2012, 05/12/2013, 05/03/2014   Influenza, High Dose Seasonal PF 05/01/2017   Influenza, Quadrivalent, Recombinant, Inj, Pf 05/07/2018, 03/30/2019   Influenza,inj,Quad PF,6+ Mos 05/15/2013, 03/30/2016   Influenza-Unspecified 05/03/2014, 05/15/2017   PFIZER Comirnaty(Gray Top)Covid-19 Tri-Sucrose Vaccine 11/19/2020   PFIZER(Purple Top)SARS-COV-2 Vaccination 07/29/2019, 08/09/2019, 11/19/2020   Pneumococcal Conjugate-13 12/01/2013, 04/03/2014   Pneumococcal Polysaccharide-23 06/04/2003, 06/13/2003, 07/23/2010   Td,absorbed, Preservative Free, Adult Use, Lf Unspecified 06/17/2005, 07/23/2010   Zoster Recombinat (Shingrix) 08/13/2017   Zoster, Live 10/02/2007    Past Medical History:  Diagnosis Date   Allergy    Arthritis    "right knee and right thumb" (12/19/2014)   Asthma    pt denies   Atrial fibrillation ([place]) 2021   Bronchitis 07/2016   started in December and continued about 2 months   Central retinal artery occlusion 06/26/2013   CHF (congestive heart failure) ([place]) 08/24/2020   Esophageal stricture    GERD (gastroesophageal reflux disease)    Hemochromatosis    Hiatal hernia    Hypercalcemia    Hypercholesteremia    Hypertension    Internal hemorrhoids    Lung  cancer ([name])    Osteoarthritis    Palpitations    Persistent atrial fibrillation ([name])    Presence of permanent cardiac pacemaker    hx bradycardia   Retinal artery occlusion, branch    "right eye"   Second degree AV block, Mobitz type II    Skin cancer    right shoulder   Small cell lung cancer, right lower lobe ([place]) 03/12/2020   Stroke ([place]) ~ 2011   "right eye"/ partial blindness   Tubular adenoma of colon     Tobacco History: Social History   Tobacco Use  Smoking Status Former   Years: 3.00   Types: Cigarettes   Quit date: 05/16/1962   Years since quitting: 58.8  Smokeless Tobacco Never  Tobacco Comments   "quit smoking in the 1960's"; was an intermittent smoker    Counseling given: Not Answered Tobacco comments: "quit smoking in the 1960's"; was an intermittent smoker    Continue to not smoke  Outpatient Encounter Medications as of 03/14/2021  Medication Sig   acetaminophen (TYLENOL) 500 MG tablet Take  500-1,000 mg by mouth every 6 (six) hours as needed for moderate pain.   albuterol (VENTOLIN HFA) 108 (90 Base) MCG/ACT inhaler Inhale 1 puff into the lungs every 6 (six) hours as needed for wheezing or shortness of breath.   benzonatate (TESSALON) 200 MG capsule Take 1 capsule (200 mg total) by mouth 3 (three) times daily as needed for cough.   celecoxib (CELEBREX) 200 MG capsule Take 1 capsule (200 mg total) by mouth 2 (two) times daily as needed for mild pain.   COVID-19 mRNA Vac-TriS, Pfizer, (PFIZER-BIONT COVID-19 VAC-TRIS) SUSP injection Inject into the muscle.   dofetilide (TIKOSYN) 250 MCG capsule Take 1 capsule (250 mcg total) by mouth 2 (two) times daily.   doxazosin (CARDURA) 4 MG tablet Take 4 mg by mouth daily.    esomeprazole (NEXIUM) 20 MG capsule Take 20 mg by mouth daily before breakfast.   memantine (NAMENDA) 10 MG tablet Take 1 tablet (10 mg total) by mouth 2 (two) times daily.   metoprolol succinate (TOPROL-XL) 25 MG 24 hr tablet Take 0.5 tablets  (12.5 mg total) by mouth at bedtime.   montelukast (SINGULAIR) 10 MG tablet Take 10 mg by mouth every morning.    Multiple Vitamins-Minerals (PRESERVISION AREDS 2 PO) Take 1 capsule by mouth in the morning and at bedtime.    potassium chloride SA (KLOR-CON) 20 MEQ tablet Take 63meq (4 tablets by mouth) on Monday, Wednesday and Friday - the days you take your diuretic, Torsemide.  Take 3meq (1 tablet by mouth) on the days you do not take your diuretic. (Patient taking differently: Take 20-80 mEq by mouth See admin instructions. Take 80 meq by mouth on Monday, Wednesday and Friday and 20 meq on Tuesday, Thursday, Saturday and Sunday)   predniSONE (DELTASONE) 20 MG tablet Take 2 tablets (40 mg total) by mouth daily with breakfast for 14 days.   predniSONE (DELTASONE) 5 MG tablet 7 tablet p.o. daily for 1 week followed by 5 tablet p.o. daily for 1 week followed by 3 tablet p.o. daily for 1 week followed by 1 tablet p.o. daily for 1 week then stop.   rivaroxaban (XARELTO) 20 MG TABS tablet Take 1 tablet (20 mg total) by mouth daily with supper.   torsemide (DEMADEX) 20 MG tablet TAKE 2 TABLETS(40 MG) BY MOUTH DAILY   vitamin C (ASCORBIC ACID) 500 MG tablet Take 500 mg by mouth at bedtime.    [DISCONTINUED] atorvastatin (LIPITOR) 20 MG tablet TAKE 1 TABLET(20 MG) BY MOUTH DAILY   [DISCONTINUED] fluticasone (FLONASE) 50 MCG/ACT nasal spray Place 1 spray into both nostrils daily. (Patient not taking: Reported on 03/12/2021)   [DISCONTINUED] HYDROcodone-homatropine (HYCODAN) 5-1.5 MG/5ML syrup Take 5 mLs by mouth every 6 (six) hours as needed for cough. (Patient not taking: Reported on 03/12/2021)   No facility-administered encounter medications on file as of 03/14/2021.     Review of Systems  Review of Systems   Physical Exam  BP 118/68   Pulse 85   Ht 5\' 9"  (1.753 m)   Wt 156 lb 3.2 oz (70.9 kg)   SpO2 95% Comment: on RA  BMI 23.07 kg/m   Wt Readings from Last 5 Encounters:  03/14/21 156 lb  3.2 oz (70.9 kg)  03/12/21 155 lb 9.6 oz (70.6 kg)  11/26/20 164 lb 6.4 oz (74.6 kg)  11/20/20 162 lb 8 oz (73.7 kg)  11/18/20 155 lb (70.3 kg)    BMI Readings from Last 5 Encounters:  03/14/21 23.07 kg/m  03/12/21  22.98 kg/m  11/26/20 24.28 kg/m  11/20/20 24.00 kg/m  11/18/20 22.89 kg/m     Physical Exam General: Thin, sitting up in exam chair Eyes: EOMI, icterus Neck: Supple, no JVP appreciated Pulmonary: Clear, no wheeze or crackles, diminished in bilateral bases Cardiovascular: Regular rate and rhythm, no murmur Abdomen: Nondistended, no obvious organomegaly MSK: No synovitis, joint effusion Neuro: Ambulates with assistance of walking stick, no weakness Psych: Normal mood, full affect   Assessment & Plan:   Dyspnea on exertion: Suspect multifactorial and largely acute changes related to deconditioning in the setting of decreased activity while undergoing radiation.  Other Magick major contributor is volume overload as evidenced by chronic interlobular septal thickening bilateral pleural effusions.  Most recent CT scan shows worsening diffuse changes of the same and increased bilateral pleural effusions.  Suspect element of acute on chronic volume overload contributing to acute changes.  Lastly, some concern for radiation fibrosis.  Timing wise this is concerning.  However, there is no focal or discrete area of groundglass or interlobular septal thickening that is suggestive of this.  I do not think his mucus and CT scan is indicative of pulmonary fibrosis.  However, given background busy appearance I am amenable to another trial of steroids.  We will plan 40 mg daily x2 weeks.  He is to contact me and if breathing not significantly improved (has not significantly proved with current steroids), we will rapidly taper steroids off.  If steroids have helped breathing, we will plan for a slower, around 12-week taper.  I think is imperative to reengage cardiology given his abnormal  echocardiogram, left atrial dilation, and chronic fluid overload with worsening on most recent imaging.  Lung cancer: Undergoing surveillance by Dr. Earlie Server.  Recently completed radiation therapy to right hilum and subcarinal node 01/2021 as well as whole brain radiation 01/2021.  Return in about 8 weeks (around 05/09/2021).   Lanier Clam, MD 03/14/2021

## 2021-03-14 NOTE — Patient Instructions (Addendum)
Nice to meet you  Take prednisone 40 mg daily for 14 days.  Send a message or call the office and let me know if the breathing is better or not towards the end of those 2 weeks. We will decide how to taper based on results.  I will see you back in 8  weeks, or sooner as needed.

## 2021-03-17 ENCOUNTER — Ambulatory Visit
Admission: RE | Admit: 2021-03-17 | Discharge: 2021-03-17 | Disposition: A | Discharge status: Home / Self Care | Payer: Medicare Other | Source: Ambulatory Visit | Attending: Radiation Oncology | Admitting: Radiation Oncology

## 2021-03-17 DIAGNOSIS — C3431 Malignant neoplasm of lower lobe, right bronchus or lung: Secondary | ICD-10-CM

## 2021-03-17 NOTE — Progress Notes (Signed)
  Radiation Oncology         [id number]) [phone] ________________________________  Name: Alexander Murray, Dr. MRN: [id number]  Date of Service: 03/17/2021  DOB: 24-Jan-1941  Post Treatment Telephone Note  Diagnosis:   Progressive Stage IB, pT2aN0M0 small cell carcinoma of the right lower lobe, with local progression in the nodes of the mediastinum  Interval Since Last Radiation:  7 weeks   12/03/2020 through 01/31/2021 Site Technique Total Dose (Gy) Dose per Fx (Gy) Completed Fx Beam Energies  Brain: Brain IMRT 25/25 2.5 10/10 6X  Lung, Right: Lung_Rt 3D 60/60 2 30/30 6X  Lung, Right: Lung_Rt_Bst 3D 6/6 2 3/3 6X    Narrative:  The patient was contacted today for routine follow-up. During treatment he did very well with radiotherapy and did not have significant desquamation but did develop significant fatigue during therapy. He reports he is having improvement in his fatigue but has some gait imbalance and blurred vision since radiotherapy. He is also getting treated with steroids by pulmonary medicine. He has had shortness of breath since radiation and some minimal productive cough but no fevers.   Impression/Plan: 1. Progressive Stage IB, pT2aN0M0 small cell carcinoma of the right lower lobe, with local progression in the nodes of the mediastinum. The patient has been doing well since completion of radiotherapy. We discussed that he will continue follow up with Dr. Julien Nordmann as well as be followed in brain oncology conference with routine MRIs of the brain beginning in about 2 months. I encouraged him to follow up with pulmonary and if his symptoms don't improve consider treating as well with antibiotics prior to cardiology. He was offered neuro rehab but would like to hold off on this right now but will let us know if he changes his mind and desires consultation.      Carola Rhine, PAC

## 2021-03-18 DIAGNOSIS — H353211 Exudative age-related macular degeneration, right eye, with active choroidal neovascularization: Secondary | ICD-10-CM | POA: Diagnosis not present

## 2021-03-18 DIAGNOSIS — H5213 Myopia, bilateral: Secondary | ICD-10-CM | POA: Diagnosis not present

## 2021-03-18 DIAGNOSIS — H52203 Unspecified astigmatism, bilateral: Secondary | ICD-10-CM | POA: Diagnosis not present

## 2021-03-18 DIAGNOSIS — H4720 Unspecified optic atrophy: Secondary | ICD-10-CM | POA: Diagnosis not present

## 2021-03-20 ENCOUNTER — Other Ambulatory Visit: Payer: Self-pay | Admitting: Radiation Therapy

## 2021-03-20 DIAGNOSIS — C7949 Secondary malignant neoplasm of other parts of nervous system: Secondary | ICD-10-CM

## 2021-03-20 DIAGNOSIS — H3561 Retinal hemorrhage, right eye: Secondary | ICD-10-CM | POA: Diagnosis not present

## 2021-03-20 DIAGNOSIS — C7931 Secondary malignant neoplasm of brain: Secondary | ICD-10-CM

## 2021-03-24 ENCOUNTER — Ambulatory Visit (INDEPENDENT_AMBULATORY_CARE_PROVIDER_SITE_OTHER): Payer: Medicare Other

## 2021-03-24 DIAGNOSIS — I442 Atrioventricular block, complete: Secondary | ICD-10-CM

## 2021-03-24 NOTE — Telephone Encounter (Signed)
Received the following message from patient:   "On prednisone 10 days now. Mood better, appetite better. Resting po2 and pulse OK. No real change in exercise tolerance. Cough better but still present, seems to be coming more from throat than deeper in lung. Less mucous but still pinkish in color about half the time. Spoke with Dr. Ida Rogue nurse for follow up visit last week and she suggested short trial of Augmentin for possible subclinical infection.    How do you suggest we proceed? Alexander Murray.    DOB 03-29-1941"  MH, can you please advise? Thanks.

## 2021-03-25 ENCOUNTER — Inpatient Hospital Stay: Payer: Medicare Other

## 2021-03-25 ENCOUNTER — Other Ambulatory Visit: Payer: Self-pay

## 2021-03-25 ENCOUNTER — Telehealth: Payer: Self-pay | Admitting: Internal Medicine

## 2021-03-25 ENCOUNTER — Telehealth: Payer: Self-pay | Admitting: Medical Oncology

## 2021-03-25 DIAGNOSIS — C7951 Secondary malignant neoplasm of bone: Secondary | ICD-10-CM | POA: Diagnosis not present

## 2021-03-25 DIAGNOSIS — C3490 Malignant neoplasm of unspecified part of unspecified bronchus or lung: Secondary | ICD-10-CM

## 2021-03-25 DIAGNOSIS — R748 Abnormal levels of other serum enzymes: Secondary | ICD-10-CM | POA: Diagnosis not present

## 2021-03-25 DIAGNOSIS — R5383 Other fatigue: Secondary | ICD-10-CM | POA: Diagnosis not present

## 2021-03-25 DIAGNOSIS — C3431 Malignant neoplasm of lower lobe, right bronchus or lung: Secondary | ICD-10-CM | POA: Diagnosis not present

## 2021-03-25 DIAGNOSIS — I4891 Unspecified atrial fibrillation: Secondary | ICD-10-CM | POA: Diagnosis not present

## 2021-03-25 LAB — CMP (CANCER CENTER ONLY)
ALT: 29 U/L (ref 0–44)
AST: 20 U/L (ref 15–41)
Albumin: 3.5 g/dL (ref 3.5–5.0)
Alkaline Phosphatase: 389 U/L — ABNORMAL HIGH (ref 38–126)
Anion gap: 6 (ref 5–15)
BUN: 28 mg/dL — ABNORMAL HIGH (ref 8–23)
CO2: 27 mmol/L (ref 22–32)
Calcium: 10.1 mg/dL (ref 8.9–10.3)
Chloride: 104 mmol/L (ref 98–111)
Creatinine: 1.12 mg/dL (ref 0.61–1.24)
GFR, Estimated: >60 mL/min (ref >60)
Glucose, Bld: 149 mg/dL — ABNORMAL HIGH (ref 70–99)
Potassium: 4.3 mmol/L (ref 3.5–5.1)
Sodium: 137 mmol/L (ref 135–145)
Total Bilirubin: 1.9 mg/dL — ABNORMAL HIGH (ref 0.3–1.2)
Total Protein: 6.6 g/dL (ref 6.5–8.1)

## 2021-03-25 LAB — CBC WITH DIFFERENTIAL (CANCER CENTER ONLY)
Abs Immature Granulocytes: 0.02 10*3/uL (ref 0.00–0.07)
Basophils Absolute: 0 10*3/uL (ref 0.0–0.1)
Basophils Relative: 0 %
Eosinophils Absolute: 0.1 10*3/uL (ref 0.0–0.5)
Eosinophils Relative: 1 %
HCT: 34.3 % — ABNORMAL LOW (ref 39.0–52.0)
Hemoglobin: 11.2 g/dL — ABNORMAL LOW (ref 13.0–17.0)
Immature Granulocytes: 0 %
Lymphocytes Relative: 2 %
Lymphs Abs: 0.1 10*3/uL — ABNORMAL LOW (ref 0.7–4.0)
MCH: 28.2 pg (ref 26.0–34.0)
MCHC: 32.7 g/dL (ref 30.0–36.0)
MCV: 86.4 fL (ref 80.0–100.0)
Monocytes Absolute: 0.2 10*3/uL (ref 0.1–1.0)
Monocytes Relative: 4 %
Neutro Abs: 6 10*3/uL (ref 1.7–7.7)
Neutrophils Relative %: 93 %
Platelet Count: 110 10*3/uL — ABNORMAL LOW (ref 150–400)
RBC: 3.97 MIL/uL — ABNORMAL LOW (ref 4.22–5.81)
RDW: 17.8 % — ABNORMAL HIGH (ref 11.5–15.5)
WBC Count: 6.5 10*3/uL (ref 4.0–10.5)
nRBC: 0 % (ref 0.0–0.2)

## 2021-03-25 LAB — CUP PACEART REMOTE DEVICE CHECK
Battery Remaining Longevity: 36 mo
Battery Voltage: 2.95 V
Brady Statistic AP VP Percent: 28.29 %
Brady Statistic AP VS Percent: 0.01 %
Brady Statistic AS VP Percent: 71.63 %
Brady Statistic AS VS Percent: 0.08 %
Brady Statistic RA Percent Paced: 28.21 %
Brady Statistic RV Percent Paced: 99.88 %
Date Time Interrogation Session: 20220822140334
Implantable Lead Implant Date: 20160518
Implantable Lead Implant Date: 20160518
Implantable Lead Location: 753859
Implantable Lead Location: 753860
Implantable Lead Model: 5076
Implantable Lead Model: 5076
Implantable Pulse Generator Implant Date: 20160518
Lead Channel Impedance Value: 342 Ohm
Lead Channel Impedance Value: 380 Ohm
Lead Channel Impedance Value: 456 Ohm
Lead Channel Impedance Value: 494 Ohm
Lead Channel Pacing Threshold Amplitude: 0.5 V
Lead Channel Pacing Threshold Amplitude: 0.75 V
Lead Channel Pacing Threshold Pulse Width: 0.4 ms
Lead Channel Pacing Threshold Pulse Width: 0.4 ms
Lead Channel Sensing Intrinsic Amplitude: 2.5 mV
Lead Channel Sensing Intrinsic Amplitude: 2.5 mV
Lead Channel Sensing Intrinsic Amplitude: 4.875 mV
Lead Channel Sensing Intrinsic Amplitude: 4.875 mV
Lead Channel Setting Pacing Amplitude: 1.5 V
Lead Channel Setting Pacing Amplitude: 2.5 V
Lead Channel Setting Pacing Pulse Width: 0.4 ms
Lead Channel Setting Sensing Sensitivity: 0.9 mV

## 2021-03-25 NOTE — Telephone Encounter (Signed)
No energy or stamina. Requests to check CBC/diff. Schedule message sent.

## 2021-03-25 NOTE — Telephone Encounter (Signed)
Scheduled appt per 8/23 sch msg. Pt aware.

## 2021-03-26 ENCOUNTER — Telehealth: Payer: Self-pay | Admitting: Pulmonary Disease

## 2021-03-26 ENCOUNTER — Telehealth: Payer: Self-pay | Admitting: Physician Assistant

## 2021-03-26 DIAGNOSIS — R0609 Other forms of dyspnea: Secondary | ICD-10-CM

## 2021-03-26 DIAGNOSIS — R0600 Dyspnea, unspecified: Secondary | ICD-10-CM

## 2021-03-26 MED ORDER — PREDNISONE 10 MG PO TABS: ORAL_TABLET | ORAL | 0 refills | Status: DC

## 2021-03-26 NOTE — Telephone Encounter (Signed)
I am sorry it did not help. Complete 14 days at 40 mg daily. Then will plan to drop to 30 mg daily x 3 days, 20 mg daily x 3 days, 10 mg x 3 days, 5 mg x 3 days.

## 2021-03-26 NOTE — Telephone Encounter (Signed)
I called and spoke with patient regarding message. Patient had also sent a mychart message on 03/24/21 but I informed patient that [facility] has been off the last 2 days. Patient is still SOB while taking prednisone and is wanting to know if continue to keep taking it. Also patient is interested in pulm rehab to help. Patient is ok while resting but O2 drops with any activity. Will route to [facility] for recs.  Dr. Silas Flood, please advise. Thanks!

## 2021-03-26 NOTE — Telephone Encounter (Signed)
Called and spoke with patient. He verbalized understanding about the prednisone. I will go ahead and send the RX to [facility] for him.   He wanted to know MH's opinion about starting pulmonary rehab and whether or not it would be of benefit to him.   MH, can you please advise? Thanks!

## 2021-03-26 NOTE — Telephone Encounter (Signed)
ATC Patient.  LM to call back. 

## 2021-03-26 NOTE — Telephone Encounter (Signed)
I would be in favor of pulmonary rehab.

## 2021-03-26 NOTE — Telephone Encounter (Signed)
Called and spoke with Patient.  Dr. Kavin Leech recommendations given.  Understanding stated.  Pulmonary rehab order placed. Nothing further at this time.

## 2021-03-26 NOTE — Telephone Encounter (Signed)
I called the patient about his labs yesterday. His Hbg is 11.2. discussed that he does not need a blood transfusion and that this is likely not the main etiology of his fatigue and dyspnea on exertion. I reviewed his pulmonologist note with him and reviewed his recommendations. Advised him to contact them for further instructions regarding his prednisone. We also reviewed his future appointments. He also knows to call sooner if he feels like his anemia needs to be rechecked.

## 2021-03-27 ENCOUNTER — Encounter: Payer: Self-pay | Admitting: Internal Medicine

## 2021-03-31 ENCOUNTER — Telehealth (HOSPITAL_COMMUNITY): Payer: Self-pay

## 2021-03-31 NOTE — Telephone Encounter (Signed)
Pt called to get scheduled for pulmonary rehab I advised pt that he would have to be cleared and his notes has to be reviewed by the nurse navigator and that we have a 1-3 month backlog and we would contact him at a later time. I also gave pt [facility] pulmonary rehab location numbers as he requested.

## 2021-03-31 NOTE — Telephone Encounter (Signed)
MH please advise. Thanks   Status report 03/31/2021 Have begun weaning off prednisone, dose @ 30 mgm now with about    11 days to go if I do 30, 20, 10 ,5. PO2 with exertion has dropped a bit to 86-88 with 3 or 4 minute recovery. Pulse jumps up to 100 quickly. I checked with [facility],they have your prescription and notes and nurse evaluator will review. Unfortunately their appointments are backed up 30 to 60 days.Marland Kitchen theysuggsted calling [facility] or [place] to see if can be seen sooner. Will also check [facility]. Do you have preferences or suggestions. Will using an incentive spirometer help? Please call if there is anything to discuss. [phone]. Thanks.

## 2021-04-02 NOTE — Telephone Encounter (Signed)
Pt insurance is active and benefits verified through Medicare A/B. Co-pay $0.00, DED $233.00/$233.00 met, out of pocket $0.00/$0.00 met, co-insurance 20%. No pre-authorization required. 04/02/21 @ 2:08PM   2ndary insurance is active and benefits verified through [facility]. Co-pay $0.00, DED $0.00/$0.00 met, out of pocket $0.00/$0.00 met, co-insurance 0%. No pre-authorization required. 04/02/21 @ 2:08PM

## 2021-04-04 ENCOUNTER — Other Ambulatory Visit: Payer: Self-pay | Admitting: Pulmonary Disease

## 2021-04-04 ENCOUNTER — Telehealth: Payer: Self-pay | Admitting: Pulmonary Disease

## 2021-04-04 MED ORDER — PREDNISONE 10 MG PO TABS: ORAL_TABLET | ORAL | 0 refills | Status: DC

## 2021-04-04 NOTE — Telephone Encounter (Signed)
Let's go back to 30 mg daily and hold it there until further instructed. When can we get him a walk? With the holiday weekend, may have to go to ED if breathing and oxygen worsens.

## 2021-04-04 NOTE — Telephone Encounter (Signed)
Could we do a walk in office today to qualify? Is his breathing feeling worse as we come down on steroids?

## 2021-04-04 NOTE — Telephone Encounter (Signed)
I called and spoke with patient and have sent in refill for pred to preferred pharmacy. Nothing further needed.

## 2021-04-04 NOTE — Telephone Encounter (Signed)
I called and spoke with patient and he stated that his breathing and esp his cough has gotten worse since coming down on pred taper.we already have 5 walk this afternoon and we do not have anymore openings for this afternoon. Will route back to [place].  Dr. Silas Flood, please advise. Thanks!

## 2021-04-04 NOTE — Telephone Encounter (Signed)
I called and spoke with patient regarding Dr. Silas Flood recs. Patient verbalized understanding and will increase to 30mg  daily of pred. The next available appt for 6MW was 04/17/21 at 2pm so he was scheduled for that and will go to the ER if symptoms worsen. Patient verbalized understanding, nothing further needed.

## 2021-04-04 NOTE — Telephone Encounter (Signed)
Primary Pulmonologist: Dr. Silas Flood Last office visit and with whom: 03/14/21 with Dr. Silas Flood What do we see them for (pulmonary problems): DOE, lung cancer Last OV assessment/plan: see below  Was appointment offered to patient (explain)?      Dyspnea on exertion: Suspect multifactorial and largely acute changes related to deconditioning in the setting of decreased activity while undergoing radiation.  Other Magick major contributor is volume overload as evidenced by chronic interlobular septal thickening bilateral pleural effusions.  Most recent CT scan shows worsening diffuse changes of the same and increased bilateral pleural effusions.  Suspect element of acute on chronic volume overload contributing to acute changes.  Lastly, some concern for radiation fibrosis.  Timing wise this is concerning.  However, there is no focal or discrete area of groundglass or interlobular septal thickening that is suggestive of this.  I do not think his mucus and CT scan is indicative of pulmonary fibrosis.  However, given background busy appearance I am amenable to another trial of steroids.  We will plan 40 mg daily x2 weeks.  He is to contact me and if breathing not significantly improved (has not significantly proved with current steroids), we will rapidly taper steroids off.  If steroids have helped breathing, we will plan for a slower, around 12-week taper.  I think is imperative to reengage cardiology given his abnormal echocardiogram, left atrial dilation, and chronic fluid overload with worsening on most recent imaging.   Lung cancer: Undergoing surveillance by Dr. Earlie Server.  Recently completed radiation therapy to right hilum and subcarinal node 01/2021 as well as whole brain radiation 01/2021.   Return in about 8 weeks (around 05/09/2021).       Reason for call: patient called and stated he is still feeling SOB with exertion, still having cough with occ clear mucous and feeling very fatigued. Patient  stated his O2 stats are in the 90s with rest and drops to mid 80s with any movement. He is wondering if he needs to be on oxygen. Will route to Dr. Silas Flood.  Dr. Silas Flood, please advise. Thanks!  (examples of things to ask: : When did symptoms start? Fever? Cough? Productive? Color to sputum? More sputum than usual? Wheezing? Have you needed increased oxygen? Are you taking your respiratory medications? What over the counter measures have you tried?)  Allergies  Allergen Reactions   Lisinopril Palpitations and Other (See Comments)    Dizziness, also    Immunization History  Administered Date(s) Administered   Influenza Split 06/16/2011, 05/17/2012, 05/12/2013, 05/03/2014   Influenza, High Dose Seasonal PF 05/01/2017   Influenza, Quadrivalent, Recombinant, Inj, Pf 05/07/2018, 03/30/2019   Influenza,inj,Quad PF,6+ Mos 05/15/2013, 03/30/2016   Influenza-Unspecified 05/03/2014, 05/15/2017   PFIZER Comirnaty(Gray Top)Covid-19 Tri-Sucrose Vaccine 11/19/2020   PFIZER(Purple Top)SARS-COV-2 Vaccination 07/29/2019, 08/09/2019, 11/19/2020   Pneumococcal Conjugate-13 12/01/2013, 04/03/2014   Pneumococcal Polysaccharide-23 06/04/2003, 06/13/2003, 07/23/2010   Td,absorbed, Preservative Free, Adult Use, Lf Unspecified 06/17/2005, 07/23/2010   Zoster Recombinat (Shingrix) 08/13/2017   Zoster, Live 10/02/2007

## 2021-04-05 DIAGNOSIS — Z902 Acquired absence of lung [part of]: Secondary | ICD-10-CM | POA: Diagnosis not present

## 2021-04-05 DIAGNOSIS — I119 Hypertensive heart disease without heart failure: Secondary | ICD-10-CM | POA: Diagnosis not present

## 2021-04-05 DIAGNOSIS — Z950 Presence of cardiac pacemaker: Secondary | ICD-10-CM | POA: Diagnosis not present

## 2021-04-05 DIAGNOSIS — J189 Pneumonia, unspecified organism: Secondary | ICD-10-CM | POA: Diagnosis not present

## 2021-04-05 DIAGNOSIS — I498 Other specified cardiac arrhythmias: Secondary | ICD-10-CM | POA: Diagnosis not present

## 2021-04-05 DIAGNOSIS — R531 Weakness: Secondary | ICD-10-CM | POA: Diagnosis not present

## 2021-04-05 DIAGNOSIS — Z85118 Personal history of other malignant neoplasm of bronchus and lung: Secondary | ICD-10-CM | POA: Diagnosis not present

## 2021-04-05 DIAGNOSIS — I700 Atherosclerosis of aorta: Secondary | ICD-10-CM | POA: Diagnosis not present

## 2021-04-05 DIAGNOSIS — R0689 Other abnormalities of breathing: Secondary | ICD-10-CM | POA: Diagnosis not present

## 2021-04-05 DIAGNOSIS — R9431 Abnormal electrocardiogram [ECG] [EKG]: Secondary | ICD-10-CM | POA: Diagnosis not present

## 2021-04-05 DIAGNOSIS — R059 Cough, unspecified: Secondary | ICD-10-CM | POA: Diagnosis not present

## 2021-04-05 DIAGNOSIS — Z20822 Contact with and (suspected) exposure to covid-19: Secondary | ICD-10-CM | POA: Diagnosis not present

## 2021-04-05 DIAGNOSIS — I4891 Unspecified atrial fibrillation: Secondary | ICD-10-CM | POA: Diagnosis not present

## 2021-04-05 DIAGNOSIS — R0602 Shortness of breath: Secondary | ICD-10-CM | POA: Diagnosis not present

## 2021-04-08 ENCOUNTER — Encounter (INDEPENDENT_AMBULATORY_CARE_PROVIDER_SITE_OTHER): Payer: Self-pay

## 2021-04-08 ENCOUNTER — Encounter (HOSPITAL_COMMUNITY): Payer: Self-pay | Admitting: *Deleted

## 2021-04-08 ENCOUNTER — Encounter (INDEPENDENT_AMBULATORY_CARE_PROVIDER_SITE_OTHER): Payer: Medicare Other | Admitting: Ophthalmology

## 2021-04-08 DIAGNOSIS — H3581 Retinal edema: Secondary | ICD-10-CM

## 2021-04-08 NOTE — Progress Notes (Signed)
Received referral from Dr. Silas Flood for this pt to participate in pulmonary rehab with the the diagnosis of dyspnea on exertion. Clinical review of pt follow up appt on 8/12 Pulmonary office note.  Pt with Covid Risk Score - 7. [place] -75 sought urgent care  Oxygen applied and pt has loaner from [facility] but will need prescription. Pt has upcoming appt with Polly Cobia NP on 9/12 and walk test to qualify for oxygen on 9/15.   Pt appropriate for getting on the waitlist while awaiting the completion of the above patients for Pulmonary rehab.  Will forward to support staff. Cherre Huger, BSN Cardiac and Training and development officer

## 2021-04-08 NOTE — Telephone Encounter (Signed)
FYI for [place]  I spoke with med. records in [place] and they will be faxing lab  and  CXR  reports to your office. Thanks. [name]

## 2021-04-08 NOTE — Telephone Encounter (Signed)
Received the following email from the pt:  Things fell apart when prednisone was reduced to 20 mgm. In hindsight [place] PO2 was about 75. Went to Urgent Care. They were concerned that I might pass out if wife drove me so was transported to [facility] by ambulance. Evaluation there with portable CXR suggested pneumonia and possible increase in pulmonary effusion.  No prior films for comparison but they had access to my chart. Sent home on Doxycycline 100 BID. O2 at 1 liter Prednisone 40/day [facility] has loaned me an oxygen generator for use in the condo.  I need Rx for either some tanks or a small battery powered unit that I can use   Called and spoke with the pt  He states started to have increased SOB when tapered to 20 mg pred  [place] bc he felt SOB and he was 75%ra- taken to ED and dx with PNA  He is on doxy and pred back up to 30mg   They told him to be on o2 1lpm  He has a concentrator that he is using but no other tanks and he is requesting that we sent order for this  I explained that he needs ov and set him up for 04/14/21 with Tammy- that was the next available we have and he can be walked then to qualify He wants [place] send order for o2 now, and states he may be willing to pay out of pocket for this No qualifying walk done and he has not been seen since 03/14/21  Dr Silas Flood, please advise thanks

## 2021-04-09 ENCOUNTER — Inpatient Hospital Stay (HOSPITAL_BASED_OUTPATIENT_CLINIC_OR_DEPARTMENT_OTHER)
Admission: EM | Admit: 2021-04-09 | Discharge: 2021-04-16 | DRG: 196 | Disposition: A | Discharge status: Skilled Nursing Facility | Payer: Medicare Other | Attending: Internal Medicine | Admitting: Internal Medicine

## 2021-04-09 ENCOUNTER — Other Ambulatory Visit: Payer: Self-pay

## 2021-04-09 ENCOUNTER — Emergency Department (HOSPITAL_BASED_OUTPATIENT_CLINIC_OR_DEPARTMENT_OTHER): Payer: Medicare Other

## 2021-04-09 ENCOUNTER — Encounter (HOSPITAL_BASED_OUTPATIENT_CLINIC_OR_DEPARTMENT_OTHER): Payer: Self-pay | Admitting: *Deleted

## 2021-04-09 DIAGNOSIS — Z66 Do not resuscitate: Secondary | ICD-10-CM | POA: Diagnosis present

## 2021-04-09 DIAGNOSIS — R0600 Dyspnea, unspecified: Secondary | ICD-10-CM | POA: Diagnosis not present

## 2021-04-09 DIAGNOSIS — J849 Interstitial pulmonary disease, unspecified: Secondary | ICD-10-CM | POA: Diagnosis present

## 2021-04-09 DIAGNOSIS — Z20822 Contact with and (suspected) exposure to covid-19: Secondary | ICD-10-CM | POA: Diagnosis present

## 2021-04-09 DIAGNOSIS — I4819 Other persistent atrial fibrillation: Secondary | ICD-10-CM | POA: Diagnosis not present

## 2021-04-09 DIAGNOSIS — C3431 Malignant neoplasm of lower lobe, right bronchus or lung: Secondary | ICD-10-CM | POA: Diagnosis present

## 2021-04-09 DIAGNOSIS — Z9221 Personal history of antineoplastic chemotherapy: Secondary | ICD-10-CM

## 2021-04-09 DIAGNOSIS — Z85828 Personal history of other malignant neoplasm of skin: Secondary | ICD-10-CM

## 2021-04-09 DIAGNOSIS — R5381 Other malaise: Secondary | ICD-10-CM | POA: Diagnosis not present

## 2021-04-09 DIAGNOSIS — E7800 Pure hypercholesterolemia, unspecified: Secondary | ICD-10-CM | POA: Diagnosis present

## 2021-04-09 DIAGNOSIS — J90 Pleural effusion, not elsewhere classified: Secondary | ICD-10-CM | POA: Diagnosis not present

## 2021-04-09 DIAGNOSIS — Z923 Personal history of irradiation: Secondary | ICD-10-CM

## 2021-04-09 DIAGNOSIS — I4821 Permanent atrial fibrillation: Secondary | ICD-10-CM | POA: Diagnosis present

## 2021-04-09 DIAGNOSIS — R531 Weakness: Secondary | ICD-10-CM | POA: Diagnosis not present

## 2021-04-09 DIAGNOSIS — I712 Thoracic aortic aneurysm, without rupture: Secondary | ICD-10-CM | POA: Diagnosis present

## 2021-04-09 DIAGNOSIS — J45909 Unspecified asthma, uncomplicated: Secondary | ICD-10-CM | POA: Diagnosis present

## 2021-04-09 DIAGNOSIS — I110 Hypertensive heart disease with heart failure: Secondary | ICD-10-CM | POA: Diagnosis present

## 2021-04-09 DIAGNOSIS — Z743 Need for continuous supervision: Secondary | ICD-10-CM | POA: Diagnosis not present

## 2021-04-09 DIAGNOSIS — Z888 Allergy status to other drugs, medicaments and biological substances status: Secondary | ICD-10-CM

## 2021-04-09 DIAGNOSIS — D630 Anemia in neoplastic disease: Secondary | ICD-10-CM | POA: Diagnosis present

## 2021-04-09 DIAGNOSIS — E871 Hypo-osmolality and hyponatremia: Secondary | ICD-10-CM | POA: Diagnosis present

## 2021-04-09 DIAGNOSIS — N400 Enlarged prostate without lower urinary tract symptoms: Secondary | ICD-10-CM | POA: Diagnosis present

## 2021-04-09 DIAGNOSIS — Z950 Presence of cardiac pacemaker: Secondary | ICD-10-CM

## 2021-04-09 DIAGNOSIS — I442 Atrioventricular block, complete: Secondary | ICD-10-CM | POA: Diagnosis present

## 2021-04-09 DIAGNOSIS — R0609 Other forms of dyspnea: Secondary | ICD-10-CM | POA: Diagnosis not present

## 2021-04-09 DIAGNOSIS — J9621 Acute and chronic respiratory failure with hypoxia: Secondary | ICD-10-CM | POA: Diagnosis present

## 2021-04-09 DIAGNOSIS — J9811 Atelectasis: Secondary | ICD-10-CM | POA: Diagnosis not present

## 2021-04-09 DIAGNOSIS — Z87891 Personal history of nicotine dependence: Secondary | ICD-10-CM

## 2021-04-09 DIAGNOSIS — Z8249 Family history of ischemic heart disease and other diseases of the circulatory system: Secondary | ICD-10-CM

## 2021-04-09 DIAGNOSIS — J189 Pneumonia, unspecified organism: Secondary | ICD-10-CM | POA: Insufficient documentation

## 2021-04-09 DIAGNOSIS — R17 Unspecified jaundice: Secondary | ICD-10-CM | POA: Diagnosis present

## 2021-04-09 DIAGNOSIS — I7121 Aneurysm of the ascending aorta, without rupture: Secondary | ICD-10-CM | POA: Diagnosis present

## 2021-04-09 DIAGNOSIS — I517 Cardiomegaly: Secondary | ICD-10-CM | POA: Diagnosis not present

## 2021-04-09 DIAGNOSIS — I5033 Acute on chronic diastolic (congestive) heart failure: Secondary | ICD-10-CM | POA: Diagnosis present

## 2021-04-09 DIAGNOSIS — Z79899 Other long term (current) drug therapy: Secondary | ICD-10-CM

## 2021-04-09 DIAGNOSIS — Z9981 Dependence on supplemental oxygen: Secondary | ICD-10-CM

## 2021-04-09 DIAGNOSIS — K219 Gastro-esophageal reflux disease without esophagitis: Secondary | ICD-10-CM | POA: Diagnosis present

## 2021-04-09 DIAGNOSIS — J918 Pleural effusion in other conditions classified elsewhere: Secondary | ICD-10-CM | POA: Diagnosis present

## 2021-04-09 DIAGNOSIS — C3490 Malignant neoplasm of unspecified part of unspecified bronchus or lung: Secondary | ICD-10-CM | POA: Diagnosis not present

## 2021-04-09 DIAGNOSIS — J700 Acute pulmonary manifestations due to radiation: Secondary | ICD-10-CM | POA: Diagnosis present

## 2021-04-09 DIAGNOSIS — D696 Thrombocytopenia, unspecified: Secondary | ICD-10-CM | POA: Diagnosis present

## 2021-04-09 DIAGNOSIS — J9601 Acute respiratory failure with hypoxia: Secondary | ICD-10-CM | POA: Diagnosis present

## 2021-04-09 DIAGNOSIS — R0602 Shortness of breath: Secondary | ICD-10-CM | POA: Diagnosis not present

## 2021-04-09 DIAGNOSIS — J948 Other specified pleural conditions: Secondary | ICD-10-CM | POA: Diagnosis not present

## 2021-04-09 DIAGNOSIS — Z7901 Long term (current) use of anticoagulants: Secondary | ICD-10-CM

## 2021-04-09 DIAGNOSIS — R5383 Other fatigue: Secondary | ICD-10-CM | POA: Diagnosis not present

## 2021-04-09 DIAGNOSIS — Y842 Radiological procedure and radiotherapy as the cause of abnormal reaction of the patient, or of later complication, without mention of misadventure at the time of the procedure: Secondary | ICD-10-CM | POA: Diagnosis present

## 2021-04-09 DIAGNOSIS — Z96651 Presence of right artificial knee joint: Secondary | ICD-10-CM | POA: Diagnosis present

## 2021-04-09 DIAGNOSIS — Z9889 Other specified postprocedural states: Secondary | ICD-10-CM

## 2021-04-09 DIAGNOSIS — Z8673 Personal history of transient ischemic attack (TIA), and cerebral infarction without residual deficits: Secondary | ICD-10-CM

## 2021-04-09 LAB — COMPREHENSIVE METABOLIC PANEL
ALT: 26 U/L (ref 0–44)
AST: 22 U/L (ref 15–41)
Albumin: 3.7 g/dL (ref 3.5–5.0)
Alkaline Phosphatase: 369 U/L — ABNORMAL HIGH (ref 38–126)
Anion gap: 8 (ref 5–15)
BUN: 21 mg/dL (ref 8–23)
CO2: 24 mmol/L (ref 22–32)
Calcium: 9.8 mg/dL (ref 8.9–10.3)
Chloride: 100 mmol/L (ref 98–111)
Creatinine, Ser: 0.74 mg/dL (ref 0.61–1.24)
GFR, Estimated: >60 mL/min (ref >60)
Glucose, Bld: 164 mg/dL — ABNORMAL HIGH (ref 70–99)
Potassium: 3.7 mmol/L (ref 3.5–5.1)
Sodium: 132 mmol/L — ABNORMAL LOW (ref 135–145)
Total Bilirubin: 2.8 mg/dL — ABNORMAL HIGH (ref 0.3–1.2)
Total Protein: 6.4 g/dL — ABNORMAL LOW (ref 6.5–8.1)

## 2021-04-09 LAB — CBC WITH DIFFERENTIAL/PLATELET
Abs Immature Granulocytes: 0.04 10*3/uL (ref 0.00–0.07)
Basophils Absolute: 0 10*3/uL (ref 0.0–0.1)
Basophils Relative: 0 %
Eosinophils Absolute: 0.1 10*3/uL (ref 0.0–0.5)
Eosinophils Relative: 1 %
HCT: 33.6 % — ABNORMAL LOW (ref 39.0–52.0)
Hemoglobin: 10.9 g/dL — ABNORMAL LOW (ref 13.0–17.0)
Immature Granulocytes: 1 %
Lymphocytes Relative: 1 %
Lymphs Abs: 0.1 10*3/uL — ABNORMAL LOW (ref 0.7–4.0)
MCH: 27.7 pg (ref 26.0–34.0)
MCHC: 32.4 g/dL (ref 30.0–36.0)
MCV: 85.5 fL (ref 80.0–100.0)
Monocytes Absolute: 0.5 10*3/uL (ref 0.1–1.0)
Monocytes Relative: 6 %
Neutro Abs: 6.6 10*3/uL (ref 1.7–7.7)
Neutrophils Relative %: 91 %
Platelets: 107 10*3/uL — ABNORMAL LOW (ref 150–400)
RBC: 3.93 MIL/uL — ABNORMAL LOW (ref 4.22–5.81)
RDW: 18.3 % — ABNORMAL HIGH (ref 11.5–15.5)
WBC: 7.3 10*3/uL (ref 4.0–10.5)
nRBC: 0 % (ref 0.0–0.2)

## 2021-04-09 LAB — BRAIN NATRIURETIC PEPTIDE: B Natriuretic Peptide: 336.1 pg/mL — ABNORMAL HIGH (ref 0.0–100.0)

## 2021-04-09 LAB — RESP PANEL BY RT-PCR (FLU A&B, COVID) ARPGX2
Influenza A by PCR: NEGATIVE
Influenza B by PCR: NEGATIVE
SARS Coronavirus 2 by RT PCR: NEGATIVE

## 2021-04-09 LAB — PROCALCITONIN: Procalcitonin: <0.1 ng/mL

## 2021-04-09 IMAGING — DX DG CHEST 1V PORT
1 series · 2 of 2 positions shown · non-contrast
Comparison: Chest CT [DATE] and earlier.

CLINICAL DATA: 80-year-old male with shortness of breath. Recently
diagnosed with pneumonia. History of lung cancer and interstitial
lung disease.

EXAM:
PORTABLE CHEST 1 VIEW

[Series 1: chest · 0.14mm/px · 2 of 2 slices shown]
[im 1/2]
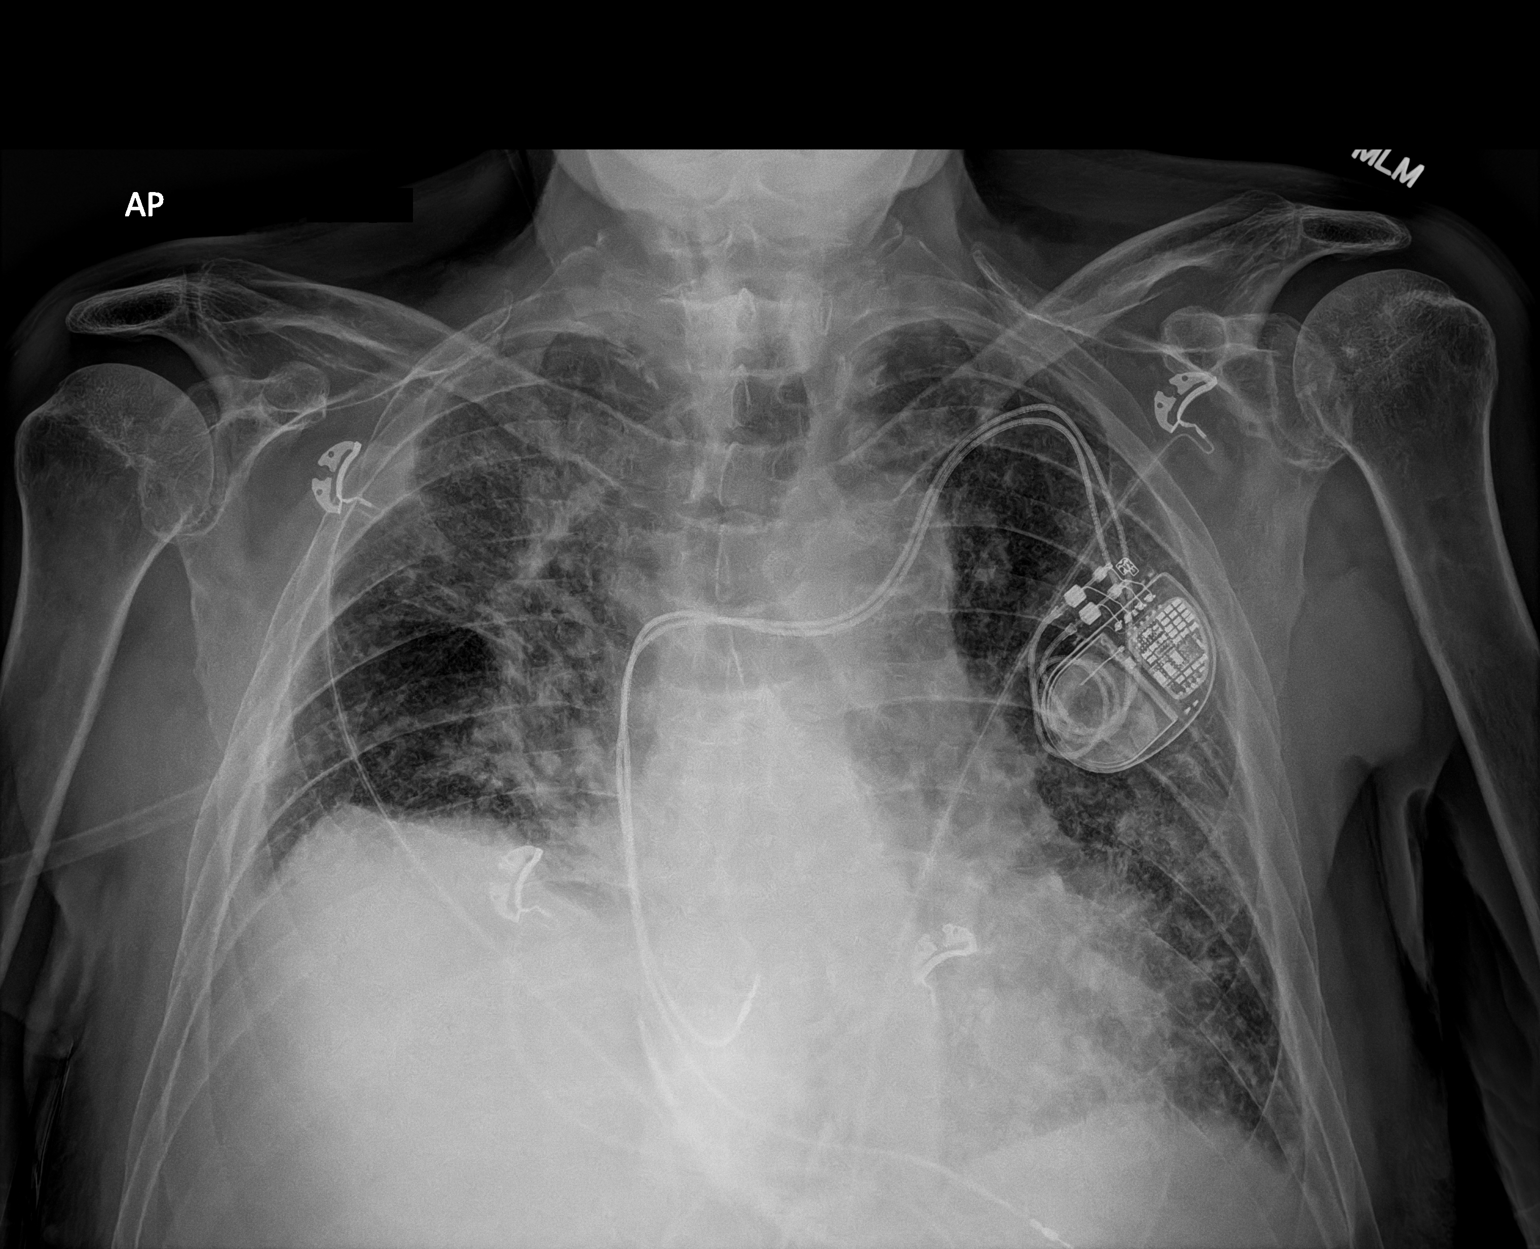
[im 2/2]
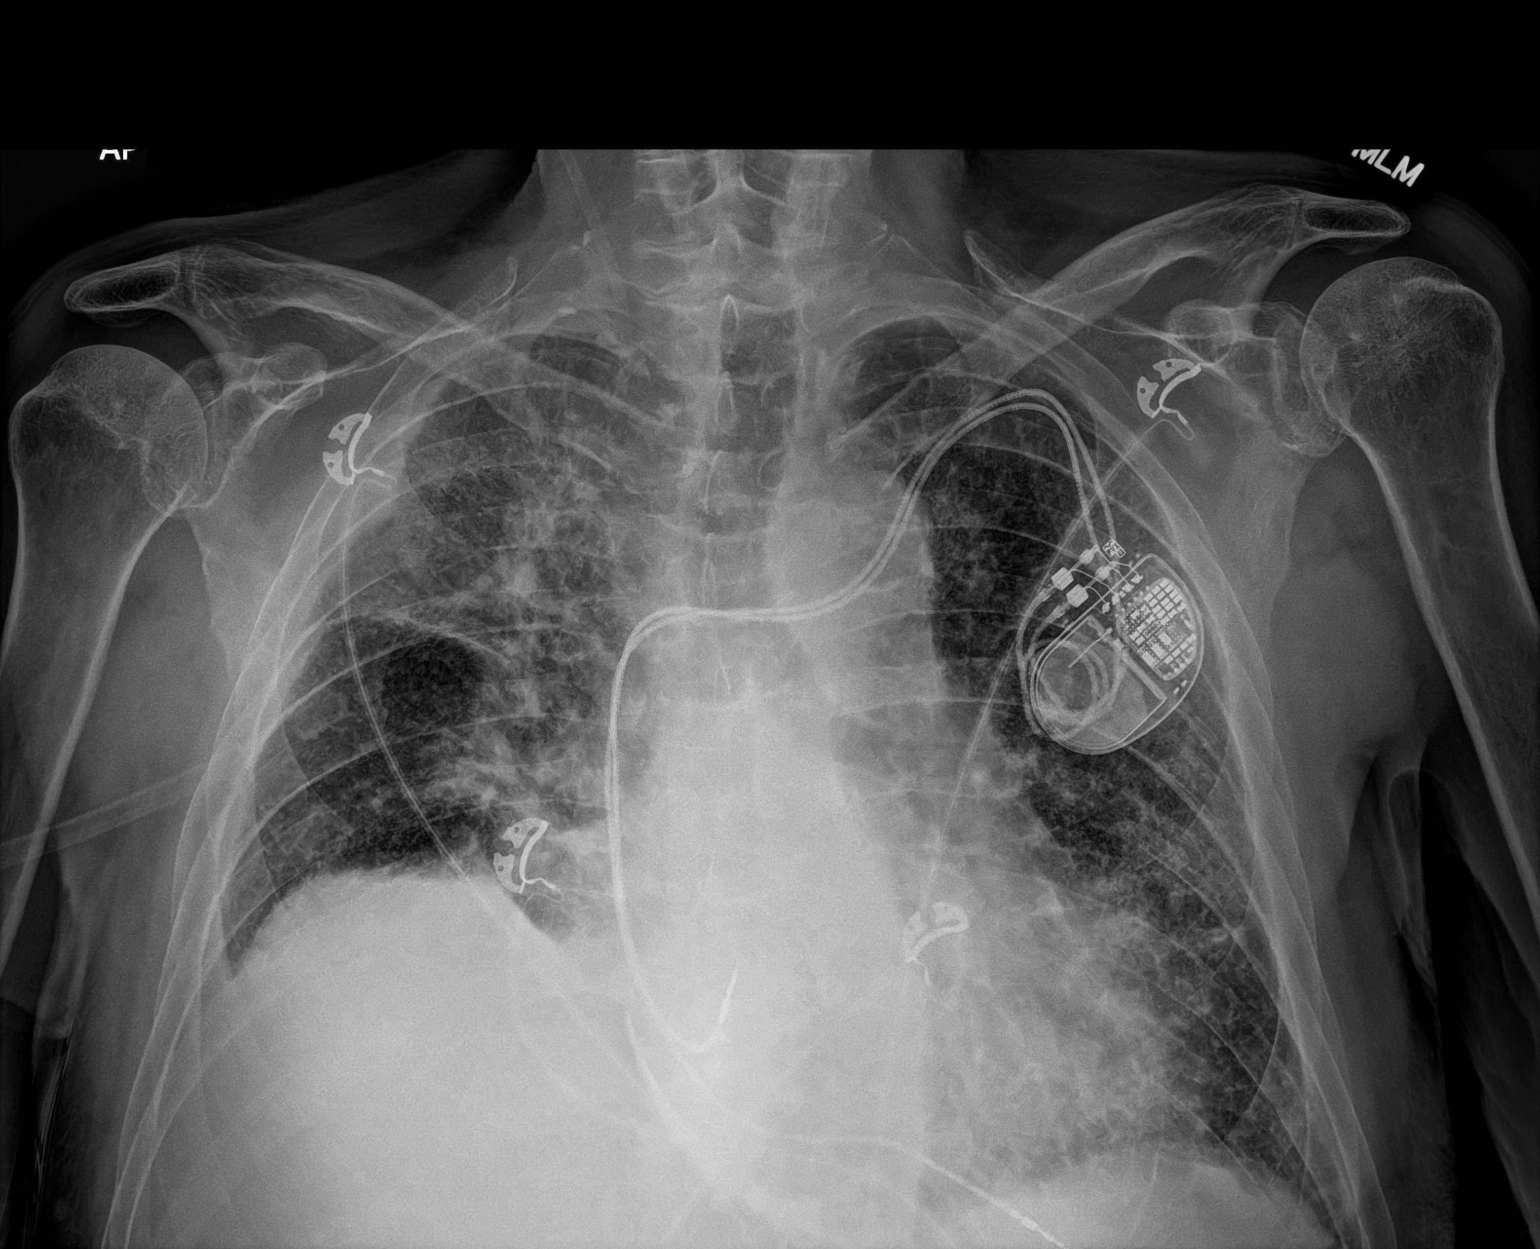

[2 of 2 positions shown; findings below may reference images not displayed]

FINDINGS: Portable AP upright view at [PP] hours. Lower lung volumes. Stable
cardiac size and mediastinal contours. Chronic pericardial
calcification. Stable left chest dual lead cardiac pacemaker.

Increased vague right upper lobe and streaky left lung base opacity
from earlier this year. Small pleural effusions seen by CT last
month likely persist. No pneumothorax. Lung markings elsewhere
appears stable.
IMPRESSION: Chronic lung disease with lower lung volumes.
Increased right upper lobe and left lung base opacity is nonspecific
but could indicate acute infectious exacerbation. Query
signs/symptoms of pneumonia. And small pleural effusion seen by CT
last month likely persist.

## 2021-04-09 MED ORDER — BENZONATATE 100 MG PO CAPS
200.0000 mg | ORAL_CAPSULE | Freq: Three times a day (TID) | ORAL | Status: DC | PRN
  Administered 2021-04-10 – 2021-04-16 (×9): 200 mg via ORAL
  Filled 2021-04-09 (×10): qty 2

## 2021-04-09 MED ORDER — SODIUM CHLORIDE 0.9 % IV SOLN
2.0000 g | INTRAVENOUS | Status: AC
  Administered 2021-04-09 – 2021-04-13 (×4): 2 g via INTRAVENOUS
  Filled 2021-04-09 (×5): qty 20

## 2021-04-09 MED ORDER — PANTOPRAZOLE SODIUM 40 MG PO TBEC
40.0000 mg | DELAYED_RELEASE_TABLET | Freq: Every day | ORAL | Status: DC
Start: 2021-04-10 — End: 2021-04-17
  Administered 2021-04-10 – 2021-04-16 (×7): 40 mg via ORAL
  Filled 2021-04-09 (×7): qty 1

## 2021-04-09 MED ORDER — ONDANSETRON HCL 4 MG/2ML IJ SOLN: 4.0000 mg | Freq: Four times a day (QID) | INTRAMUSCULAR | Status: DC | PRN

## 2021-04-09 MED ORDER — SODIUM CHLORIDE 0.9 % IV SOLN
100.0000 mg | Freq: Two times a day (BID) | INTRAVENOUS | Status: DC
  Administered 2021-04-09 – 2021-04-14 (×9): 100 mg via INTRAVENOUS
  Filled 2021-04-09 (×11): qty 100

## 2021-04-09 MED ORDER — ACETAMINOPHEN 325 MG PO TABS
650.0000 mg | ORAL_TABLET | Freq: Four times a day (QID) | ORAL | Status: DC | PRN
  Administered 2021-04-13 – 2021-04-16 (×5): 650 mg via ORAL
  Filled 2021-04-09 (×5): qty 2

## 2021-04-09 MED ORDER — PREDNISONE 10 MG PO TABS: 10.0000 mg | ORAL_TABLET | ORAL | Status: DC

## 2021-04-09 MED ORDER — FUROSEMIDE 10 MG/ML IJ SOLN
20.0000 mg | Freq: Two times a day (BID) | INTRAMUSCULAR | Status: DC
  Administered 2021-04-10 – 2021-04-14 (×9): 20 mg via INTRAVENOUS
  Filled 2021-04-09 (×9): qty 2

## 2021-04-09 MED ORDER — FUROSEMIDE 10 MG/ML IJ SOLN
20.0000 mg | Freq: Once | INTRAMUSCULAR | Status: AC
  Administered 2021-04-09: 20 mg via INTRAVENOUS
  Filled 2021-04-09 (×2): qty 2

## 2021-04-09 MED ORDER — VANCOMYCIN HCL IN DEXTROSE 1-5 GM/200ML-% IV SOLN
1000.0000 mg | Freq: Once | INTRAVENOUS | Status: AC
  Administered 2021-04-09: 1000 mg via INTRAVENOUS
  Filled 2021-04-09: qty 200

## 2021-04-09 MED ORDER — VANCOMYCIN HCL 1500 MG/300ML IV SOLN
1500.0000 mg | INTRAVENOUS | Status: DC
  Filled 2021-04-09: qty 300

## 2021-04-09 MED ORDER — ACETAMINOPHEN 650 MG RE SUPP: 650.0000 mg | Freq: Four times a day (QID) | RECTAL | Status: DC | PRN

## 2021-04-09 MED ORDER — METOPROLOL SUCCINATE ER 25 MG PO TB24
12.5000 mg | ORAL_TABLET | Freq: Every day | ORAL | Status: DC
  Administered 2021-04-09 – 2021-04-16 (×8): 12.5 mg via ORAL
  Filled 2021-04-09 (×8): qty 1

## 2021-04-09 MED ORDER — SENNOSIDES-DOCUSATE SODIUM 8.6-50 MG PO TABS: 1.0000 | ORAL_TABLET | Freq: Every evening | ORAL | Status: DC | PRN

## 2021-04-09 MED ORDER — IOHEXOL 350 MG/ML SOLN
100.0000 mL | Freq: Once | INTRAVENOUS | Status: AC | PRN
  Administered 2021-04-09: 75 mL via INTRAVENOUS

## 2021-04-09 MED ORDER — ALBUTEROL SULFATE (2.5 MG/3ML) 0.083% IN NEBU
3.0000 mL | INHALATION_SOLUTION | Freq: Four times a day (QID) | RESPIRATORY_TRACT | Status: DC | PRN
  Administered 2021-04-10: 3 mL via RESPIRATORY_TRACT
  Filled 2021-04-09: qty 3

## 2021-04-09 MED ORDER — RIVAROXABAN 20 MG PO TABS
20.0000 mg | ORAL_TABLET | Freq: Every day | ORAL | Status: DC
  Administered 2021-04-10 – 2021-04-16 (×7): 20 mg via ORAL
  Filled 2021-04-09 (×7): qty 1

## 2021-04-09 MED ORDER — AZITHROMYCIN 500 MG IV SOLR: 500.0000 mg | INTRAVENOUS | Status: DC

## 2021-04-09 MED ORDER — DOXAZOSIN MESYLATE 2 MG PO TABS
4.0000 mg | ORAL_TABLET | Freq: Every day | ORAL | Status: DC
  Administered 2021-04-11 – 2021-04-16 (×6): 4 mg via ORAL
  Filled 2021-04-09 (×7): qty 2

## 2021-04-09 MED ORDER — ATORVASTATIN CALCIUM 10 MG PO TABS
20.0000 mg | ORAL_TABLET | Freq: Every day | ORAL | Status: DC
  Administered 2021-04-10 – 2021-04-16 (×7): 20 mg via ORAL
  Filled 2021-04-09 (×7): qty 2

## 2021-04-09 MED ORDER — MONTELUKAST SODIUM 10 MG PO TABS
10.0000 mg | ORAL_TABLET | ORAL | Status: DC
  Administered 2021-04-10 – 2021-04-16 (×7): 10 mg via ORAL
  Filled 2021-04-09 (×8): qty 1

## 2021-04-09 MED ORDER — DOFETILIDE 250 MCG PO CAPS
250.0000 ug | ORAL_CAPSULE | Freq: Two times a day (BID) | ORAL | Status: DC
  Administered 2021-04-09 – 2021-04-16 (×15): 250 ug via ORAL
  Filled 2021-04-09 (×17): qty 1

## 2021-04-09 MED ORDER — POTASSIUM CHLORIDE CRYS ER 20 MEQ PO TBCR
20.0000 meq | EXTENDED_RELEASE_TABLET | Freq: Every day | ORAL | Status: DC
  Administered 2021-04-09: 20 meq via ORAL
  Filled 2021-04-09: qty 1

## 2021-04-09 MED ORDER — MEMANTINE HCL 10 MG PO TABS
10.0000 mg | ORAL_TABLET | Freq: Two times a day (BID) | ORAL | Status: DC
  Administered 2021-04-09 – 2021-04-16 (×15): 10 mg via ORAL
  Filled 2021-04-09 (×15): qty 1

## 2021-04-09 MED ORDER — ONDANSETRON HCL 4 MG PO TABS: 4.0000 mg | ORAL_TABLET | Freq: Four times a day (QID) | ORAL | Status: DC | PRN

## 2021-04-09 MED ORDER — SODIUM CHLORIDE 0.9 % IV SOLN
2.0000 g | Freq: Once | INTRAVENOUS | Status: AC
  Administered 2021-04-09: 2 g via INTRAVENOUS
  Filled 2021-04-09: qty 2

## 2021-04-09 NOTE — ED Notes (Signed)
Report called to Donato Heinz with [facility].

## 2021-04-09 NOTE — Plan of Care (Signed)
Alexander Murray is a 80 year old male with pmh HTN, HLD, A. 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 Servando Snare, chemotherapy, and prophylactic brain radiation) who presented with complaints of worsening shortness of breath with exertion.  Patient had just recently been seen in the hospital on [facility] 4 days ago and started on doxycycline for pneumonia.  Despite taking medication patient reported worsening symptoms.  CT angiogram of the chest negative for pulmonary embolus, but did note worsening bilateral infiltrates with lateral pleural effusions.  Patient was noted to be afebrile and O2 saturations currently maintained on home 2 L which he has as needed.  Labs significant for WBC 7.3, hemoglobin 10.9, platelets 107, and sodium 132.  Initially started on empiric antibiotics of vancomycin and cefepime for possible HCAP.  Recommended obtaining BNP which was ordered and pending.  Unclear if patient's symptoms are secondary to pneumonia, CHF, and/or possible recurrence of malignancy at this time.  Accepted to a telemetry bed.

## 2021-04-09 NOTE — Telephone Encounter (Signed)
Hello Dr. Silas Flood, please advise on mychart message below, thanks!    A week ago, on 30 mgm prednisone, no oxygen, I was able to maintain resting po2 in low 90's and could take slow walks and do my chores. Po2 would drop to mid 80's but I never gasped for air. Now on 2 l/min resting po2 is in low 90's but I cannot do any activity without a sharp drop to low 80's unless I use o2. Cough is worse if anything. Generally dry with some pink mucous. So not really sure what is next. My doxycycline runs out Friday PM. I am going to try to move to the Rehab Unit here at [facility] to be in a more compact area with some help. Not sure what else to do.   Call with advice Ary Lavine [phone]

## 2021-04-09 NOTE — H&P (Signed)
History and Physical    Alexander [place], Dr. 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  Patient completed radiation in July and currently under observation with his oncologist but reports worsening shortness of breath and fatigue for a little over a month.  He had been started on prednisone, did not notice much improvement, but reports significant worsening when he began to decrease the dose.  He has since gone back to 40 mg prednisone daily but continued to become more dyspneic, has cough with scant sputum production, often pink-tinged, but no fevers, chills, chest pain, or leg swelling.  He is prescribed torsemide but reports that he has not been taking it regularly since his legs have not been swollen and his weight has been fairly stable.  He does note a recent 2 pound weight gain but no swelling.  He was seen at an outside emergency department on 04/05/2021 for worsening dyspnea, was diagnosed with bilateral pneumonia, and was started on doxycycline and supplemental oxygen.  He continued to worsen despite this.  [facility] ED Course: Upon arrival to the ED, patient is found to be afebrile, saturating mid 90s on 3 L/min of supplemental oxygen, mildly tachypneic, and with stable blood pressure.  EKG features paced rhythm.  Chemistry panel with chronic elevations in alkaline phosphatase and total bilirubin.  CBC with stable anemia and thrombocytopenia.  BNP elevated to 336.  CTA chest negative for PE but notable for groundglass opacities and interlobular septal thickening  on a background of fibrotic changes as well as increased bilateral pleural effusions, concern for pulmonary edema, reflux of contrast into the hepatic veins, and possible infection.  Blood cultures were collected in the emergency department, patient was treated with IV Lasix, cefepime, and vancomycin, and transferred to [facility] for ongoing evaluation and management.  Review of Systems:  All other systems reviewed and apart from HPI, are negative.  Past Medical History:  Diagnosis Date   Allergy    Arthritis    "right knee and right thumb" (12/19/2014)   Asthma    pt denies   Atrial fibrillation ([place]) 2021   Bronchitis 07/2016   started in December and continued about 2 months   Central retinal artery occlusion 06/26/2013   CHF (congestive heart failure) ([place]) 08/24/2020   Esophageal stricture    GERD (gastroesophageal reflux disease)    Hemochromatosis    Hiatal hernia    Hypercalcemia    Hypercholesteremia    Hypertension    Internal hemorrhoids    Lung cancer ([name])    Osteoarthritis    Palpitations    Persistent atrial fibrillation ([name])    Presence of permanent cardiac pacemaker    hx bradycardia   Retinal artery occlusion, branch    "right eye"   Second degree AV block, Mobitz type II    Skin cancer    right shoulder   Small cell lung cancer, right lower lobe ([place]) 03/12/2020   Stroke ([place]) ~ 2011   "right eye"/ partial blindness   Tubular adenoma of colon     Past Surgical History:  Procedure Laterality Date  CARDIOVERSION N/A 07/29/2020   Procedure: CARDIOVERSION;  Surgeon: Jerline Pain, MD;  Location: [place];  Service: Cardiovascular;  Laterality: N/A;  NEEDS RAPID COVID TEST MORNING OF   CARDIOVERSION N/A 08/19/2020   Procedure: CARDIOVERSION;  Surgeon: Thayer Headings, MD;  Location: [place];  Service: Cardiovascular;  Laterality: N/A;   CATARACT EXTRACTION, BILATERAL  01/2017   COLONOSCOPY     EP IMPLANTABLE DEVICE N/A 12/19/2014    Procedure: Pacemaker Implant;  Surgeon: Deboraha Sprang, MD;  Location: [place] [facility];  Service: Cardiovascular;  Laterality: N/A;   ESOPHAGOGASTRODUODENOSCOPY (EGD) WITH ESOPHAGEAL DILATION  X 3   EYE SURGERY Bilateral 2017   INGUINAL HERNIA REPAIR Left 1980's   INSERT / REPLACE / REMOVE PACEMAKER  12/19/2014   INTERCOSTAL NERVE BLOCK Right 03/11/2020   Procedure: INTERCOSTAL NERVE BLOCK;  Surgeon: Grace Isaac, MD;  Location: [place];  Service: Thoracic;  Laterality: Right;   JOINT REPLACEMENT Right 2016   KNEE ARTHROSCOPY Right ~ 1982; ~ [place] Right ~ 2014   "pre-melanoma scapula"   POSTERIOR TIBIAL TENDON REPAIR Left 2012   TONSILLECTOMY  1946   TOTAL KNEE ARTHROPLASTY Right 05/20/2015   Procedure: RIGHT TOTAL KNEE ARTHROPLASTY;  Surgeon: Paralee Cancel, MD;  Location: [facility];  Service: Orthopedics;  Laterality: Right;   UPPER GASTROINTESTINAL ENDOSCOPY     VIDEO BRONCHOSCOPY N/A 03/11/2020   Procedure: VIDEO BRONCHOSCOPY;  Surgeon: Grace Isaac, MD;  Location: [place];  Service: Thoracic;  Laterality: N/A;   VIDEO BRONCHOSCOPY WITH ENDOBRONCHIAL ULTRASOUND N/A 11/18/2020   Procedure: VIDEO BRONCHOSCOPY WITH ENDOBRONCHIAL ULTRASOUND;  Surgeon: Melrose Nakayama, MD;  Location: [place];  Service: Thoracic;  Laterality: N/A;   XI ROBOTIC ASSISTED THORACOSCOPY- SEGMENTECTOMY Right 03/11/2020   Procedure: XI ROBOTIC ASSISTED THORACOSCOPY-RIGHT SUPERIOR SEGMENTECTOMY WITH NODE SAMPLES;  Surgeon: Grace Isaac, MD;  Location: [place];  Service: Thoracic;  Laterality: Right;    Social History:   reports that he quit smoking about 58 years ago. His smoking use included cigarettes. He has never used smokeless tobacco. He reports that he does not drink alcohol and does not use drugs.  Allergies  Allergen Reactions   Lisinopril Palpitations and Other (See Comments)    Dizziness, also    Family History  Problem Relation Age of Onset   Colon cancer Mother        dx in  her 84s   Heart disease Mother    Alzheimer's disease Mother    Colon cancer Father        dx in his early 36s   Heart disease Father    Prostate cancer Father    Prostate cancer Brother        1/2 brother   Hemochromatosis Brother    Prostate cancer Brother    Esophageal cancer Neg Hx    Pancreatic cancer Neg Hx    Stomach cancer Neg Hx    Liver disease Neg Hx      Prior to Admission medications   Medication Sig Start Date End Date Taking? Authorizing Provider  atorvastatin (LIPITOR) 20 MG tablet TAKE 1 TABLET(20 MG) BY MOUTH DAILY 03/14/21  Yes Deboraha Sprang, MD  benzonatate (TESSALON) 200 MG capsule Take 1 capsule (200 mg total) by mouth 3 (three) times daily as needed for cough. 03/03/21  Yes Curt Bears, MD  dofetilide (TIKOSYN) 250 MCG capsule Take 1 capsule (250 mcg total) by mouth 2 (two) times daily. 09/04/20  Yes  Fenton, Clint R, PA  doxazosin (CARDURA) 4 MG tablet Take 4 mg by mouth daily.  12/27/18  Yes [provider]  esomeprazole (NEXIUM) 20 MG capsule Take 20 mg by mouth daily before breakfast.   Yes [provider]  memantine (NAMENDA) 10 MG tablet Take 1 tablet (10 mg total) by mouth 2 (two) times daily. 12/27/20  Yes Hayden Pedro, PA-C  metoprolol succinate (TOPROL-XL) 25 MG 24 hr tablet Take 0.5 tablets (12.5 mg total) by mouth at bedtime. 12/23/20  Yes Fenton, Clint R, PA  montelukast (SINGULAIR) 10 MG tablet Take 10 mg by mouth every morning.  07/05/13  Yes [provider]  Multiple Vitamins-Minerals (PRESERVISION AREDS 2 PO) Take 1 capsule by mouth in the morning and at bedtime.    Yes [provider]  potassium chloride SA (KLOR-CON) 20 MEQ tablet Take 40meq (4 tablets by mouth) on Monday, Wednesday and Friday - the days you take your diuretic, Torsemide.  Take 21meq (1 tablet by mouth) on the days you do not take your diuretic. Patient taking differently: Take 20-80 mEq by mouth See admin instructions. Take 80 meq by  mouth on Monday, Wednesday and Friday and 20 meq on Tuesday, Thursday, Saturday and Sunday 10/28/20  Yes Deboraha Sprang, MD  predniSONE (DELTASONE) 10 MG tablet Take 4 tabs x 3 days, 2 tabs x 3 days, 1 x 3 days, 1/2 x 3 days and stop 03/26/21  Yes Hunsucker, Bonna Gains, MD  rivaroxaban (XARELTO) 20 MG TABS tablet Take 1 tablet (20 mg total) by mouth daily with supper. 07/05/20  Yes Shirley Friar, PA-C  torsemide (DEMADEX) 20 MG tablet TAKE 2 TABLETS(40 MG) BY MOUTH DAILY 11/29/20  Yes Deboraha Sprang, MD  vitamin C (ASCORBIC ACID) 500 MG tablet Take 500 mg by mouth at bedtime.    Yes [provider]  acetaminophen (TYLENOL) 500 MG tablet Take 500-1,000 mg by mouth every 6 (six) hours as needed for moderate pain.    [provider]  albuterol (VENTOLIN HFA) 108 (90 Base) MCG/ACT inhaler Inhale 1 puff into the lungs every 6 (six) hours as needed for wheezing or shortness of breath. 01/20/21   Hayden Pedro, PA-C  COVID-19 mRNA Vac-TriS, Pfizer, (PFIZER-BIONT COVID-19 VAC-TRIS) SUSP injection Inject into the muscle. 11/19/20   Carlyle Basques, MD    Physical Exam: Vitals:   04/09/21 1530 04/09/21 1600 04/09/21 1834 04/09/21 2110  BP: 128/83 130/84 (!) 151/85 (!) 156/90  Pulse: 83 84 81 83  Resp: (!) 23 (!) 23 (!) 24 20  Temp:   98.7 F (37.1 C) 98 F (36.7 C)  TempSrc:   Oral Oral  SpO2: 98% 99% 97% 96%  Weight:      Height:        Constitutional: NAD, calm  Eyes: PERTLA, lids and conjunctivae normal ENMT: Mucous membranes are moist. Posterior pharynx clear of any exudate or lesions.   Neck: supple, no masses  Respiratory: Mild tachypnea, expiratory wheeze on right, diminished breath sounds bilaterally. No accessory muscle use.  Cardiovascular: S1 & S2 heard, regular rate and rhythm. No extremity edema.  Abdomen: No distension, no tenderness, soft. Bowel sounds active.  Musculoskeletal: no clubbing / cyanosis. No joint deformity upper and lower extremities.    Skin: no significant rashes, lesions, ulcers. Warm, dry, well-perfused. Neurologic: CN 2-12 grossly intact. Sensation intact. Moving all extremities.  Psychiatric: Alert and oriented to person, place, and situation. Pleasant and cooperative.    Labs and  Imaging on Admission: I have personally reviewed following labs and imaging studies  CBC: Recent Labs  Lab 04/09/21 1047  WBC 7.3  NEUTROABS 6.6  HGB 10.9*  HCT 33.6*  MCV 85.5  PLT 704*   Basic Metabolic Panel: Recent Labs  Lab 04/09/21 1047  NA 132*  K 3.7  CL 100  CO2 24  GLUCOSE 164*  BUN 21  CREATININE 0.74  CALCIUM 9.8   GFR: Estimated Creatinine Clearance: 69.4 mL/min (by C-G formula based on SCr of 0.74 mg/dL). Liver Function Tests: Recent Labs  Lab 04/09/21 1047  AST 22  ALT 26  ALKPHOS 369*  BILITOT 2.8*  PROT 6.4*  ALBUMIN 3.7   No results for input(s): LIPASE, AMYLASE in the last 168 hours. No results for input(s): AMMONIA in the last 168 hours. Coagulation Profile: No results for input(s): INR, PROTIME in the last 168 hours. Cardiac Enzymes: No results for input(s): CKTOTAL, CKMB, CKMBINDEX, TROPONINI in the last 168 hours. BNP (last 3 results) Recent Labs    10/03/20 1340  PROBNP 471   HbA1C: No results for input(s): HGBA1C in the last 72 hours. CBG: No results for input(s): GLUCAP in the last 168 hours. Lipid Profile: No results for input(s): CHOL, HDL, LDLCALC, TRIG, CHOLHDL, LDLDIRECT in the last 72 hours. Thyroid Function Tests: No results for input(s): TSH, T4TOTAL, FREET4, T3FREE, THYROIDAB in the last 72 hours. Anemia Panel: No results for input(s): VITAMINB12, FOLATE, FERRITIN, TIBC, IRON, RETICCTPCT in the last 72 hours. Urine analysis:    Component Value Date/Time   COLORURINE YELLOW 03/08/2020 1330   APPEARANCEUR CLEAR 03/08/2020 1330   LABSPEC 1.011 03/08/2020 1330   PHURINE 7.0 03/08/2020 1330   GLUCOSEU NEGATIVE 03/08/2020 1330   HGBUR NEGATIVE 03/08/2020 1330    BILIRUBINUR NEGATIVE 03/08/2020 1330   KETONESUR NEGATIVE 03/08/2020 1330   PROTEINUR NEGATIVE 03/08/2020 1330   UROBILINOGEN 1.0 05/17/2015 0858   NITRITE NEGATIVE 03/08/2020 1330   LEUKOCYTESUR NEGATIVE 03/08/2020 1330   Sepsis Labs: @LABRCNTIP (procalcitonin:4,lacticidven:4) ) Recent Results (from the past 240 hour(s))  Resp Panel by RT-PCR (Flu A&B, Covid) Nasopharyngeal Swab     Status: None   Collection Time: 04/09/21 11:47 AM   Specimen: Nasopharyngeal Swab; Nasopharyngeal(NP) swabs in vial transport medium  Result Value Ref Range Status   SARS Coronavirus 2 by RT PCR NEGATIVE NEGATIVE Final    Comment: (NOTE) SARS-CoV-2 target nucleic acids are NOT DETECTED.  The SARS-CoV-2 RNA is generally detectable in upper respiratory specimens during the acute phase of infection. The lowest concentration of SARS-CoV-2 viral copies this assay can detect is 138 copies/mL. A negative result does not preclude SARS-Cov-2 infection and should not be used as the sole basis for treatment or other patient management decisions. A negative result may occur with  improper specimen collection/handling, submission of specimen other than nasopharyngeal swab, presence of viral mutation(s) within the areas targeted by this assay, and inadequate number of viral copies(<138 copies/mL). A negative result must be combined with clinical observations, patient history, and epidemiological information. The expected result is Negative.  Fact Sheet for Patients:  EntrepreneurPulse.com.au  Fact Sheet for Healthcare Providers:  IncredibleEmployment.be  This test is no t yet approved or cleared by the [place] FDA and  has been authorized for detection and/or diagnosis of SARS-CoV-2 by FDA under an Emergency Use Authorization (EUA). This EUA will remain  in effect (meaning this test can be used) for the duration of the COVID-19 declaration under Section 564(b)(1) of the  Act, 21  U.S.C.section 360bbb-3(b)(1), unless the authorization is terminated  or revoked sooner.       Influenza A by PCR NEGATIVE NEGATIVE Final   Influenza B by PCR NEGATIVE NEGATIVE Final    Comment: (NOTE) The Xpert Xpress SARS-CoV-2/FLU/RSV plus assay is intended as an aid in the diagnosis of influenza from Nasopharyngeal swab specimens and should not be used as a sole basis for treatment. Nasal washings and aspirates are unacceptable for Xpert Xpress SARS-CoV-2/FLU/RSV testing.  Fact Sheet for Patients: EntrepreneurPulse.com.au  Fact Sheet for Healthcare Providers: IncredibleEmployment.be  This test is not yet approved or cleared by the [place] FDA and has been authorized for detection and/or diagnosis of SARS-CoV-2 by FDA under an Emergency Use Authorization (EUA). This EUA will remain in effect (meaning this test can be used) for the duration of the COVID-19 declaration under Section 564(b)(1) of the Act, 21 U.S.C. section 360bbb-3(b)(1), unless the authorization is terminated or revoked.  Performed at [facility], [address], [place], [place] [address]   Culture, blood (Routine X 2) w Reflex to ID Panel     Status: None (Preliminary result)   Collection Time: 04/09/21  2:25 PM   Specimen: BLOOD  Result Value Ref Range Status   Specimen Description BLOOD LEFT ANTECUBITAL  Final   Special Requests   Final    BOTTLES DRAWN AEROBIC AND ANAEROBIC Blood Culture adequate volume Performed at [facility] [address], [place], [place] [address]    Culture PENDING  Incomplete   Report Status PENDING  Incomplete  Culture, blood (Routine X 2) w Reflex to ID Panel     Status: None (Preliminary result)   Collection Time: 04/09/21  2:25 PM   Specimen: BLOOD  Result Value Ref Range Status   Specimen Description BLOOD RIGHT ANTECUBITAL  Final   Special Requests   Final    BOTTLES DRAWN AEROBIC AND  ANAEROBIC Blood Culture adequate volume Performed at [facility], [place] [address], [place], [place] [address]    Culture PENDING  Incomplete   Report Status PENDING  Incomplete     Radiological Exams on Admission: CT Angio Chest PE W/Cm &/Or Wo Cm  Result Date: 04/09/2021 CLINICAL DATA:  PE suspected, shortness of breath EXAM: CT ANGIOGRAPHY CHEST WITH CONTRAST TECHNIQUE: Multidetector CT imaging of the chest was performed using the standard protocol during bolus administration of intravenous contrast. Multiplanar CT image reconstructions and MIPs were obtained to evaluate the vascular anatomy. CONTRAST:  74mL OMNIPAQUE IOHEXOL 350 MG/ML SOLN COMPARISON:  03/10/2021 CT chest FINDINGS: Cardiovascular: Satisfactory opacification of the pulmonary arteries to the segmental level. No evidence of pulmonary embolism. The heart is normal in size. Redemonstrated extensive pericardial calcifications. Aortic atherosclerosis, extending into the aortic arch branch vessels. Three-vessel coronary artery calcifications. Redemonstrated aortic ectasia, measuring up to 4.2 cm. Left chest pacemaker with leads in the right atrium and right ventricle. Reflux of contrast into the IVC and hepatic veins. Mediastinum/Nodes: No enlarged mediastinal, hilar, or axillary lymph nodes. Thyroid gland, trachea, and esophagus demonstrate no significant findings. Lungs/Pleura: Postoperative changes of wedge resection of the superior segment of the right lower lobe. Increased areas of ground-glass attenuation and septal thickening superimposed on previously noted fibrotic changes. Increased right pleural effusion, large, and left pleural effusion now moderate. No pneumothorax. Upper Abdomen: No acute abnormality. Musculoskeletal: No chest wall abnormality. No acute or significant osseous findings. Redemonstrated compression deformity of T11. Review of the MIP images confirms the above findings. IMPRESSION: 1.  Increased ground-glass  opacities and interlobular septal thickening, superimposed on background fibrotic changes, with increased bilateral pleural effusions, concerning for pulmonary edema, although superimposed infection can not be excluded. 2. Reflux of contrast into the hepatic veins, which can be seen in the setting of heart failure. Correlate with BNP. 3. Negative for pulmonary embolism to the level of the segmental arteries. 4. Ectasia of the ascending thoracic aorta, measuring to 4.2 cm in diameter. Recommend annual imaging followup by CTA or MRA. This recommendation follows 2010 ACCF/AHA/AATS/ACR/ASA/SCA/SCAI/SIR/STS/SVM Guidelines for the Diagnosis and Management of Patients with Thoracic Aortic Disease. Circulation. 2010; 121: X902-I097. Aortic aneurysm NOS (ICD10-I71.9) 5.  Aortic Atherosclerosis (ICD10-I70.0). Electronically Signed   By: Merilyn Baba M.D.   On: 04/09/2021 13:37   DG Chest Port 1 View  Result Date: 04/09/2021 CLINICAL DATA:  80 year old male with shortness of breath. Recently diagnosed with pneumonia. History of lung cancer and interstitial lung disease. EXAM: PORTABLE CHEST 1 VIEW COMPARISON:  Chest CT 03/10/2021 and earlier. FINDINGS: Portable AP upright view at 1106 hours. Lower lung volumes. Stable cardiac size and mediastinal contours. Chronic pericardial calcification. Stable left chest dual lead cardiac pacemaker. Increased vague right upper lobe and streaky left lung base opacity from earlier this year. Small pleural effusions seen by CT last month likely persist. No pneumothorax. Lung markings elsewhere appears stable. IMPRESSION: Chronic lung disease with lower lung volumes. Increased right upper lobe and left lung base opacity is nonspecific but could indicate acute infectious exacerbation. Query signs/symptoms of pneumonia. And small pleural effusion seen by CT last month likely persist. Electronically Signed   By: Genevie Ann M.D.   On: 04/09/2021 11:33    EKG: Independently reviewed. Ventricular  pacing.    Assessment/Plan   1. Acute hypoxic respiratory failure; bilateral pleural effusions; acute on chronic HFpEF; hx of SCLC  - Pt with hx of HFpEF and SCLC s/p resection, chemo, and radiation presents with progressive SOB after being diagnosed with PNA on 9/3 despite being started on supplemental O2 and doxycycline  - CTA chest with large right and moderate left pleural effusions, GGO and septal thickening on background of fibrosis  - BNP elevated but lower than January  - No fevers/chills or leukocytosis  - Blood cultures were collected in ED and he was treated with IV Lasix, vancomycin, and cefepime  - Plan to consult IR for thoracentesis with fluid analysis including cytology, check procalcitonin, check strep pneumo and legionella antigens, continue IV Lasix q12h, continue prednisone, continue antibiotics with Rocephin and doxycyline for now, continue supplemental O2   2. Atrial fibrillation  - In SR on admission, managed with Tikosyn and Xarelto    3. Thrombocytopenia  - Appears stable, no bleeding  - Monitor    4. Dilatation of ascending thoracic aorta - Noted on echo in January and again on CT in ED  - Outpatient follow-up recommended    DVT prophylaxis: Xarelto  Code Status: DNR, confirmed with patient who says he recently made this decision and has talked with his wife about it  Level of Care: Level of care: Telemetry Medical Family Communication: none present  Disposition Plan:  Patient is from: ILF  Anticipated d/c is to: TBD Anticipated d/c date is: 04/12/21 Patient currently: pending thoracentesis, improved respiratory status  Consults called: None  Admission status: Inpatient     Vianne Bulls, MD [facility]  04/09/2021, 9:52 PM

## 2021-04-09 NOTE — ED Provider Notes (Signed)
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  Patient presenting not feeling very well since this weekend.  Patient went to [facility] emergency department was diagnosed with bilateral pneumonia and some evidence of pleural effusion and started on doxycycline.  Also started on oxygen at that time.  Patient is on 2 to 3 L. Patient states he has not gotten any better at all actually feels worse.  Patient's past medical history significant starting in June 2021 he had small cell cancer right lower lobe resection chemotherapy until Thanksgiving.  So had radiation treatment.  Also had some prophylactic radiation treatment to his brain.  Patient is followed by hematology oncology Dr. Earlie Server.  On the 2 to 3 L of oxygen oxygen sats are in the low 90s.  Patient is a retired Doctor, general practice      Past Medical History:  Diagnosis Date   Allergy    Arthritis    "right knee and right thumb" (12/19/2014)   Asthma    pt denies   Atrial fibrillation ([place]) 2021   Bronchitis 07/2016   started in December and continued about 2 months   Central retinal artery occlusion 06/26/2013   CHF (congestive heart failure) ([place]) 08/24/2020   Esophageal stricture    GERD (gastroesophageal reflux disease)    Hemochromatosis    Hiatal hernia    Hypercalcemia    Hypercholesteremia    Hypertension    Internal hemorrhoids    Lung cancer ([name])    Osteoarthritis    Palpitations    Persistent atrial fibrillation ([name])    Presence of permanent cardiac pacemaker    hx bradycardia   Retinal artery occlusion, branch    "right eye"   Second degree AV block, [place]itz type II    Skin cancer    right shoulder   Small cell lung cancer, right lower lobe ([place]) 03/12/2020   Stroke ([place]) ~ 2011   "right eye"/ partial blindness   Tubular  adenoma of colon     Patient Active Problem List   Diagnosis Date Noted   HCAP (healthcare-associated pneumonia) 04/09/2021   Pacemaker - MDT 10/15/2020   CHF (congestive heart failure) ([place]) 08/24/2020   Secondary hypercoagulable state ([place]) 08/12/2020   Persistent atrial fibrillation ([name])    LLQ abdominal pain 05/10/2020   Goals of care, counseling/discussion 03/19/2020   Encounter for antineoplastic chemotherapy 03/19/2020   Small cell lung cancer, right lower lobe ([place]) 03/12/2020   S/P partial lobectomy of lung 03/11/2020   Postnasal drip 10/13/2016   S/P right TKA 05/20/2015   S/P knee replacement 05/20/2015   Central retinal artery occlusion 06/26/2013    Past Surgical History:  Procedure Laterality Date   CARDIOVERSION N/A 07/29/2020   Procedure: CARDIOVERSION;  Surgeon: Jerline Pain, MD;  Location: [facility];  Service: Cardiovascular;  Laterality: N/A;  NEEDS RAPID COVID TEST MORNING OF   CARDIOVERSION N/A 08/19/2020   Procedure: CARDIOVERSION;  Surgeon: Thayer Headings, MD;  Location: [facility];  Service: Cardiovascular;  Laterality: N/A;   CATARACT EXTRACTION, BILATERAL  01/2017   COLONOSCOPY     EP IMPLANTABLE DEVICE N/A 12/19/2014   Procedure: Pacemaker Implant;  Surgeon: Deboraha Sprang, MD;  Location: [facility];  Service: Cardiovascular;  Laterality: N/A;   ESOPHAGOGASTRODUODENOSCOPY (EGD) WITH ESOPHAGEAL  DILATION  X 3   EYE SURGERY Bilateral 2017   INGUINAL HERNIA REPAIR Left 1980's   INSERT / REPLACE / REMOVE PACEMAKER  12/19/2014   INTERCOSTAL NERVE BLOCK Right 03/11/2020   Procedure: INTERCOSTAL NERVE BLOCK;  Surgeon: Grace Isaac, MD;  Location: [facility];  Service: Thoracic;  Laterality: Right;   JOINT REPLACEMENT Right 2016   KNEE ARTHROSCOPY Right ~ 1982; ~ [place] Right ~ 2014   "pre-melanoma scapula"   POSTERIOR TIBIAL TENDON REPAIR Left 2012   TONSILLECTOMY  1946   TOTAL KNEE ARTHROPLASTY Right 05/20/2015   Procedure:  RIGHT TOTAL KNEE ARTHROPLASTY;  Surgeon: Paralee Cancel, MD;  Location: [facility];  Service: Orthopedics;  Laterality: Right;   UPPER GASTROINTESTINAL ENDOSCOPY     VIDEO BRONCHOSCOPY N/A 03/11/2020   Procedure: VIDEO BRONCHOSCOPY;  Surgeon: Grace Isaac, MD;  Location: [facility];  Service: Thoracic;  Laterality: N/A;   VIDEO BRONCHOSCOPY WITH ENDOBRONCHIAL ULTRASOUND N/A 11/18/2020   Procedure: VIDEO BRONCHOSCOPY WITH ENDOBRONCHIAL ULTRASOUND;  Surgeon: Melrose Nakayama, MD;  Location: [facility];  Service: Thoracic;  Laterality: N/A;   XI ROBOTIC ASSISTED THORACOSCOPY- SEGMENTECTOMY Right 03/11/2020   Procedure: XI ROBOTIC ASSISTED THORACOSCOPY-RIGHT SUPERIOR SEGMENTECTOMY WITH NODE SAMPLES;  Surgeon: Grace Isaac, MD;  Location: [facility];  Service: Thoracic;  Laterality: Right;       Family History  Problem Relation Age of Onset   Colon cancer Mother        dx in her 22s   Heart disease Mother    Alzheimer's disease Mother    Colon cancer Father        dx in his early 37s   Heart disease Father    Prostate cancer Father    Prostate cancer Brother        1/2 brother   Hemochromatosis Brother    Prostate cancer Brother    Esophageal cancer Neg Hx    Pancreatic cancer Neg Hx    Stomach cancer Neg Hx    Liver disease Neg Hx     Social History   Tobacco Use   Smoking status: Former    Years: 3.00    Types: Cigarettes    Quit date: 05/16/1962    Years since quitting: 58.9   Smokeless tobacco: Never   Tobacco comments:    "quit smoking in the 1960's"; was an intermittent smoker   Vaping Use   Vaping Use: Never used  Substance Use Topics   Alcohol use: No    Comment: 12/19/2014 "stopped drinking in ~ 2012"   Drug use: No    Home Medications Prior to Admission medications   Medication Sig Start Date End Date Taking? Authorizing Provider  atorvastatin (LIPITOR) 20 MG tablet TAKE 1 TABLET(20 MG) BY MOUTH DAILY 03/14/21  Yes Deboraha Sprang, MD  benzonatate (TESSALON) 200 MG  capsule Take 1 capsule (200 mg total) by mouth 3 (three) times daily as needed for cough. 03/03/21  Yes Curt Bears, MD  dofetilide (TIKOSYN) 250 MCG capsule Take 1 capsule (250 mcg total) by mouth 2 (two) times daily. 09/04/20  Yes Fenton, Clint R, PA  doxazosin (CARDURA) 4 MG tablet Take 4 mg by mouth daily.  12/27/18  Yes [provider]  esomeprazole (NEXIUM) 20 MG capsule Take 20 mg by mouth daily before breakfast.   Yes [provider]  memantine (NAMENDA) 10 MG tablet Take 1 tablet (10 mg total) by mouth 2 (two) times daily. 12/27/20  Yes Dara Lords,  Rex Kras, PA-C  metoprolol succinate (TOPROL-XL) 25 MG 24 hr tablet Take 0.5 tablets (12.5 mg total) by mouth at bedtime. 12/23/20  Yes Fenton, Clint R, PA  montelukast (SINGULAIR) 10 MG tablet Take 10 mg by mouth every morning.  07/05/13  Yes [provider]  Multiple Vitamins-Minerals (PRESERVISION AREDS 2 PO) Take 1 capsule by mouth in the morning and at bedtime.    Yes [provider]  potassium chloride SA (KLOR-CON) 20 MEQ tablet Take 63meq (4 tablets by mouth) on Monday, Wednesday and Friday - the days you take your diuretic, Torsemide.  Take 47meq (1 tablet by mouth) on the days you do not take your diuretic. Patient taking differently: Take 20-80 mEq by mouth See admin instructions. Take 80 meq by mouth on Monday, Wednesday and Friday and 20 meq on Tuesday, Thursday, Saturday and Sunday 10/28/20  Yes Deboraha Sprang, MD  predniSONE (DELTASONE) 10 MG tablet Take 4 tabs x 3 days, 2 tabs x 3 days, 1 x 3 days, 1/2 x 3 days and stop 03/26/21  Yes Hunsucker, Bonna Gains, MD  rivaroxaban (XARELTO) 20 MG TABS tablet Take 1 tablet (20 mg total) by mouth daily with supper. 07/05/20  Yes Shirley Friar, PA-C  torsemide (DEMADEX) 20 MG tablet TAKE 2 TABLETS(40 MG) BY MOUTH DAILY 11/29/20  Yes Deboraha Sprang, MD  vitamin C (ASCORBIC ACID) 500 MG tablet Take 500 mg by mouth at bedtime.    Yes [provider]   acetaminophen (TYLENOL) 500 MG tablet Take 500-1,000 mg by mouth every 6 (six) hours as needed for moderate pain.    [provider]  albuterol (VENTOLIN HFA) 108 (90 Base) MCG/ACT inhaler Inhale 1 puff into the lungs every 6 (six) hours as needed for wheezing or shortness of breath. 01/20/21   Hayden Pedro, PA-C  COVID-19 mRNA Vac-TriS, Pfizer, (PFIZER-BIONT COVID-19 VAC-TRIS) SUSP injection Inject into the muscle. 11/19/20   Carlyle Basques, MD    Allergies    Lisinopril  Review of Systems   Review of Systems  Constitutional:  Positive for activity change, appetite change and fatigue. Negative for chills and fever.  HENT:  Negative for ear pain and sore throat.   Eyes:  Negative for pain and visual disturbance.  Respiratory:  Positive for cough and shortness of breath.   Cardiovascular:  Negative for chest pain and palpitations.  Gastrointestinal:  Negative for abdominal pain and vomiting.  Genitourinary:  Negative for dysuria and hematuria.  Musculoskeletal:  Negative for arthralgias and back pain.  Skin:  Negative for color change and rash.  Neurological:  Negative for seizures and syncope.  All other systems reviewed and are negative.  Physical Exam Updated Vital Signs BP (!) 151/85   Pulse 86   Temp 98.6 F (37 C) (Oral)   Resp (!) 23   Ht 1.753 m (5\' 9" )   Wt 68.9 kg   SpO2 97%   BMI 22.45 kg/m   Physical Exam Vitals and nursing note reviewed.  Constitutional:      Appearance: He is well-developed.  HENT:     Head: Normocephalic and atraumatic.     Mouth/Throat:     Mouth: Mucous membranes are dry.  Eyes:     Extraocular Movements: Extraocular movements intact.     Conjunctiva/sclera: Conjunctivae normal.     Pupils: Pupils are equal, round, and reactive to light.  Cardiovascular:     Rate and Rhythm: Normal rate and regular rhythm.  Heart sounds: No murmur heard. Pulmonary:     Effort: Pulmonary effort is normal. No respiratory  distress.     Breath sounds: Normal breath sounds. No wheezing, rhonchi or rales.  Abdominal:     Palpations: Abdomen is soft.     Tenderness: There is no abdominal tenderness.  Musculoskeletal:        General: No swelling. Normal range of motion.     Cervical back: Normal range of motion and neck supple.  Skin:    General: Skin is warm and dry.  Neurological:     General: No focal deficit present.     Mental Status: He is alert and oriented to person, place, and time.     Cranial Nerves: No cranial nerve deficit.     Sensory: No sensory deficit.     Motor: No weakness.     Coordination: Coordination normal.    ED Results / Procedures / Treatments   Labs (all labs ordered are listed, but only abnormal results are displayed) Labs Reviewed  CBC WITH DIFFERENTIAL/PLATELET - Abnormal; Notable for the following components:      Result Value   RBC 3.93 (*)    Hemoglobin 10.9 (*)    HCT 33.6 (*)    RDW 18.3 (*)    Platelets 107 (*)    Lymphs Abs 0.1 (*)    All other components within normal limits  COMPREHENSIVE METABOLIC PANEL - Abnormal; Notable for the following components:   Sodium 132 (*)    Glucose, Bld 164 (*)    Total Protein 6.4 (*)    Alkaline Phosphatase 369 (*)    Total Bilirubin 2.8 (*)    All other components within normal limits  RESP PANEL BY RT-PCR (FLU A&B, COVID) ARPGX2  CULTURE, BLOOD (ROUTINE X 2)  CULTURE, BLOOD (ROUTINE X 2)  BRAIN NATRIURETIC PEPTIDE    EKG EKG Interpretation  Date/Time:  Wednesday April 09 2021 10:37:36 EDT Ventricular Rate:  82 PR Interval:  194 QRS Duration: 155 QT Interval:  418 QTC Calculation: 489 R Axis:   -76 Text Interpretation: Atrial sensed ventricular paced No significant change since last tracing Confirmed by Fredia Sorrow [phone]) on 04/09/2021 10:47:45 AM  Radiology CT Angio Chest PE W/Cm &/Or Wo Cm  Result Date: 04/09/2021 CLINICAL DATA:  PE suspected, shortness of breath EXAM: CT ANGIOGRAPHY CHEST WITH  CONTRAST TECHNIQUE: Multidetector CT imaging of the chest was performed using the standard protocol during bolus administration of intravenous contrast. Multiplanar CT image reconstructions and MIPs were obtained to evaluate the vascular anatomy. CONTRAST:  52mL OMNIPAQUE IOHEXOL 350 MG/ML SOLN COMPARISON:  03/10/2021 CT chest FINDINGS: Cardiovascular: Satisfactory opacification of the pulmonary arteries to the segmental level. No evidence of pulmonary embolism. The heart is normal in size. Redemonstrated extensive pericardial calcifications. Aortic atherosclerosis, extending into the aortic arch branch vessels. Three-vessel coronary artery calcifications. Redemonstrated aortic ectasia, measuring up to 4.2 cm. Left chest pacemaker with leads in the right atrium and right ventricle. Reflux of contrast into the IVC and hepatic veins. Mediastinum/Nodes: No enlarged mediastinal, hilar, or axillary lymph nodes. Thyroid gland, trachea, and esophagus demonstrate no significant findings. Lungs/Pleura: Postoperative changes of wedge resection of the superior segment of the right lower lobe. Increased areas of ground-glass attenuation and septal thickening superimposed on previously noted fibrotic changes. Increased right pleural effusion, large, and left pleural effusion now moderate. No pneumothorax. Upper Abdomen: No acute abnormality. Musculoskeletal: No chest wall abnormality. No acute or significant osseous findings. Redemonstrated compression deformity of  T11. Review of the MIP images confirms the above findings. IMPRESSION: 1. Increased ground-glass opacities and interlobular septal thickening, superimposed on background fibrotic changes, with increased bilateral pleural effusions, concerning for pulmonary edema, although superimposed infection can not be excluded. 2. Reflux of contrast into the hepatic veins, which can be seen in the setting of heart failure. Correlate with BNP. 3. Negative for pulmonary embolism to  the level of the segmental arteries. 4. Ectasia of the ascending thoracic aorta, measuring to 4.2 cm in diameter. Recommend annual imaging followup by CTA or MRA. This recommendation follows 2010 ACCF/AHA/AATS/ACR/ASA/SCA/SCAI/SIR/STS/SVM Guidelines for the Diagnosis and Management of Patients with Thoracic Aortic Disease. Circulation. 2010; 121: H846-N629. Aortic aneurysm NOS (ICD10-I71.9) 5.  Aortic Atherosclerosis (ICD10-I70.0). Electronically Signed   By: Merilyn Baba M.D.   On: 04/09/2021 13:37   DG Chest Port 1 View  Result Date: 04/09/2021 CLINICAL DATA:  80 year old male with shortness of breath. Recently diagnosed with pneumonia. History of lung cancer and interstitial lung disease. EXAM: PORTABLE CHEST 1 VIEW COMPARISON:  Chest CT 03/10/2021 and earlier. FINDINGS: Portable AP upright view at 1106 hours. Lower lung volumes. Stable cardiac size and mediastinal contours. Chronic pericardial calcification. Stable left chest dual lead cardiac pacemaker. Increased vague right upper lobe and streaky left lung base opacity from earlier this year. Small pleural effusions seen by CT last month likely persist. No pneumothorax. Lung markings elsewhere appears stable. IMPRESSION: Chronic lung disease with lower lung volumes. Increased right upper lobe and left lung base opacity is nonspecific but could indicate acute infectious exacerbation. Query signs/symptoms of pneumonia. And small pleural effusion seen by CT last month likely persist. Electronically Signed   By: Genevie Ann M.D.   On: 04/09/2021 11:33    Procedures Procedures   Medications Ordered in ED Medications  vancomycin (VANCOCIN) IVPB 1000 mg/200 mL premix (has no administration in time range)  vancomycin (VANCOREADY) IVPB 1500 mg/300 mL (has no administration in time range)  iohexol (OMNIPAQUE) 350 MG/ML injection 100 mL (75 mLs Intravenous Contrast Given 04/09/21 1243)  ceFEPIme (MAXIPIME) 2 g in sodium chloride 0.9 % 100 mL IVPB (2 g Intravenous  New Bag/Given 04/09/21 1437)    ED Course  I have reviewed the triage vital signs and the nursing notes.  Pertinent labs & imaging results that were available during my care of the patient were reviewed by me and considered in my medical decision making (see chart for details).    MDM Rules/Calculators/A&P                         CRITICAL CARE Performed by: Fredia Sorrow Total critical care time: 40 minutes Critical care time was exclusive of separately billable procedures and treating other patients. Critical care was necessary to treat or prevent imminent or life-threatening deterioration. Critical care was time spent personally by me on the following activities: development of treatment plan with patient and/or surrogate as well as nursing, discussions with consultants, evaluation of patient's response to treatment, examination of patient, obtaining history from patient or surrogate, ordering and performing treatments and interventions, ordering and review of laboratory studies, ordering and review of radiographic studies, pulse oximetry and re-evaluation of patient's condition.  Patient not improving on the doxycycline.  CT scan shows some worsening of the pleural effusions but also shows opacities suggesting that the doxycycline is not helping the pneumonia.  This may very well be health acquired pneumonia.  Patient has history of small cell lung CA.  CT angio was done to better define the lungs.  Which showed worsening of the opacities.  And also ruled out pulmonary embolus which obviously was high risk for.  Patient started here on broad-spectrum pneumonia antibiotics for healthcare acquired pneumonia.  Patient is on oxygen oxygen sats are in the low 90s on his normal 2 L.  But overall patient just not feeling as if he is much better at all.  Discussed with hospitalist who will admit.  Final Clinical Impression(s) / ED Diagnoses Final diagnoses:  HCAP (healthcare-associated pneumonia)     Rx / DC Orders ED Discharge Orders     None        Fredia Sorrow, MD 04/09/21 [phone]

## 2021-04-09 NOTE — Progress Notes (Signed)
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  Pharmacy has been consulted for vancomycin and cefepime dosing.  Patient with a history of HTN, HLD, A. 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 Servando Snare, chemotherapy, and prophylactic brain radiation). Patient presenting with SOB.  Vancomycin 1000mg  given once in ED at [facility]  SCr 0.74; WBC 7.3; afebrile  Plan: Cefepime per MD Vancomycin 1500mg  q24h unless change in renal function (eAUC 453) Trend WBC, fever, renal function F/u cultures, MRSA PCR De-escalate when appropriate  Height: 5\' 9"  (175.3 cm) Weight: 68.9 kg (152 lb) IBW/kg (Calculated) : 70.7  Temp (24hrs), Avg:98.6 F (37 C), Min:98.6 F (37 C), Max:98.6 F (37 C)  Recent Labs  Lab 04/09/21 1047  WBC 7.3  CREATININE 0.74    Estimated Creatinine Clearance: 69.4 mL/min (by C-G formula based on SCr of 0.74 mg/dL).    Allergies  Allergen Reactions   Lisinopril Palpitations and Other (See Comments)    Dizziness, also   Antimicrobials this admission: cefepime 9/7 >>  vancomycin 9/7 >>   Microbiology results: Pending  Thank you for allowing pharmacy to be a part of this patient's care.  Heloise Purpura 04/09/2021 2:01 PM

## 2021-04-09 NOTE — ED Triage Notes (Signed)
Patient came in from [facility] for shortness of breath.  He was diagnosed with pneumonia this Saturday and was placed on Oxygen [place] at 3 lpm.  Has history of lung CA.

## 2021-04-10 ENCOUNTER — Inpatient Hospital Stay (HOSPITAL_COMMUNITY): Payer: Medicare Other

## 2021-04-10 HISTORY — PX: IR THORACENTESIS ASP PLEURAL SPACE W/IMG GUIDE: IMG5380

## 2021-04-10 LAB — COMPREHENSIVE METABOLIC PANEL
ALT: 27 U/L (ref 0–44)
AST: 21 U/L (ref 15–41)
Albumin: 3.1 g/dL — ABNORMAL LOW (ref 3.5–5.0)
Alkaline Phosphatase: 344 U/L — ABNORMAL HIGH (ref 38–126)
Anion gap: 7 (ref 5–15)
BUN: 14 mg/dL (ref 8–23)
CO2: 27 mmol/L (ref 22–32)
Calcium: 9.8 mg/dL (ref 8.9–10.3)
Chloride: 100 mmol/L (ref 98–111)
Creatinine, Ser: 0.77 mg/dL (ref 0.61–1.24)
GFR, Estimated: >60 mL/min (ref >60)
Glucose, Bld: 80 mg/dL (ref 70–99)
Potassium: 3.8 mmol/L (ref 3.5–5.1)
Sodium: 134 mmol/L — ABNORMAL LOW (ref 135–145)
Total Bilirubin: 2 mg/dL — ABNORMAL HIGH (ref 0.3–1.2)
Total Protein: 6.1 g/dL — ABNORMAL LOW (ref 6.5–8.1)

## 2021-04-10 LAB — LACTATE DEHYDROGENASE: LDH: 240 U/L — ABNORMAL HIGH (ref 98–192)

## 2021-04-10 LAB — CBC
HCT: 34.2 % — ABNORMAL LOW (ref 39.0–52.0)
Hemoglobin: 11 g/dL — ABNORMAL LOW (ref 13.0–17.0)
MCH: 27.6 pg (ref 26.0–34.0)
MCHC: 32.2 g/dL (ref 30.0–36.0)
MCV: 85.9 fL (ref 80.0–100.0)
Platelets: 105 10*3/uL — ABNORMAL LOW (ref 150–400)
RBC: 3.98 MIL/uL — ABNORMAL LOW (ref 4.22–5.81)
RDW: 18.1 % — ABNORMAL HIGH (ref 11.5–15.5)
WBC: 7.9 10*3/uL (ref 4.0–10.5)
nRBC: 0 % (ref 0.0–0.2)

## 2021-04-10 LAB — BODY FLUID CELL COUNT WITH DIFFERENTIAL
Eos, Fluid: 0 %
Lymphs, Fluid: 44 %
Monocyte-Macrophage-Serous Fluid: 10 % — ABNORMAL LOW (ref 50–90)
Neutrophil Count, Fluid: 46 % — ABNORMAL HIGH (ref 0–25)
Total Nucleated Cell Count, Fluid: 196 cu mm (ref 0–1000)

## 2021-04-10 LAB — LACTATE DEHYDROGENASE, PLEURAL OR PERITONEAL FLUID: LD, Fluid: 142 U/L — ABNORMAL HIGH (ref 3–23)

## 2021-04-10 LAB — PROTEIN, PLEURAL OR PERITONEAL FLUID: Total protein, fluid: <3 g/dL

## 2021-04-10 LAB — GRAM STAIN

## 2021-04-10 LAB — GLUCOSE, PLEURAL OR PERITONEAL FLUID: Glucose, Fluid: 92 mg/dL

## 2021-04-10 LAB — PROCALCITONIN: Procalcitonin: <0.1 ng/mL

## 2021-04-10 LAB — MAGNESIUM: Magnesium: 1.9 mg/dL (ref 1.7–2.4)

## 2021-04-10 IMAGING — CR DG CHEST 1V
2 series · 2 of 2 positions shown · non-contrast
Comparison: Multiple priors

CLINICAL DATA: Status right post thoracentesis

EXAM:
CHEST  1 VIEW

[chest ap (1 of 2)]
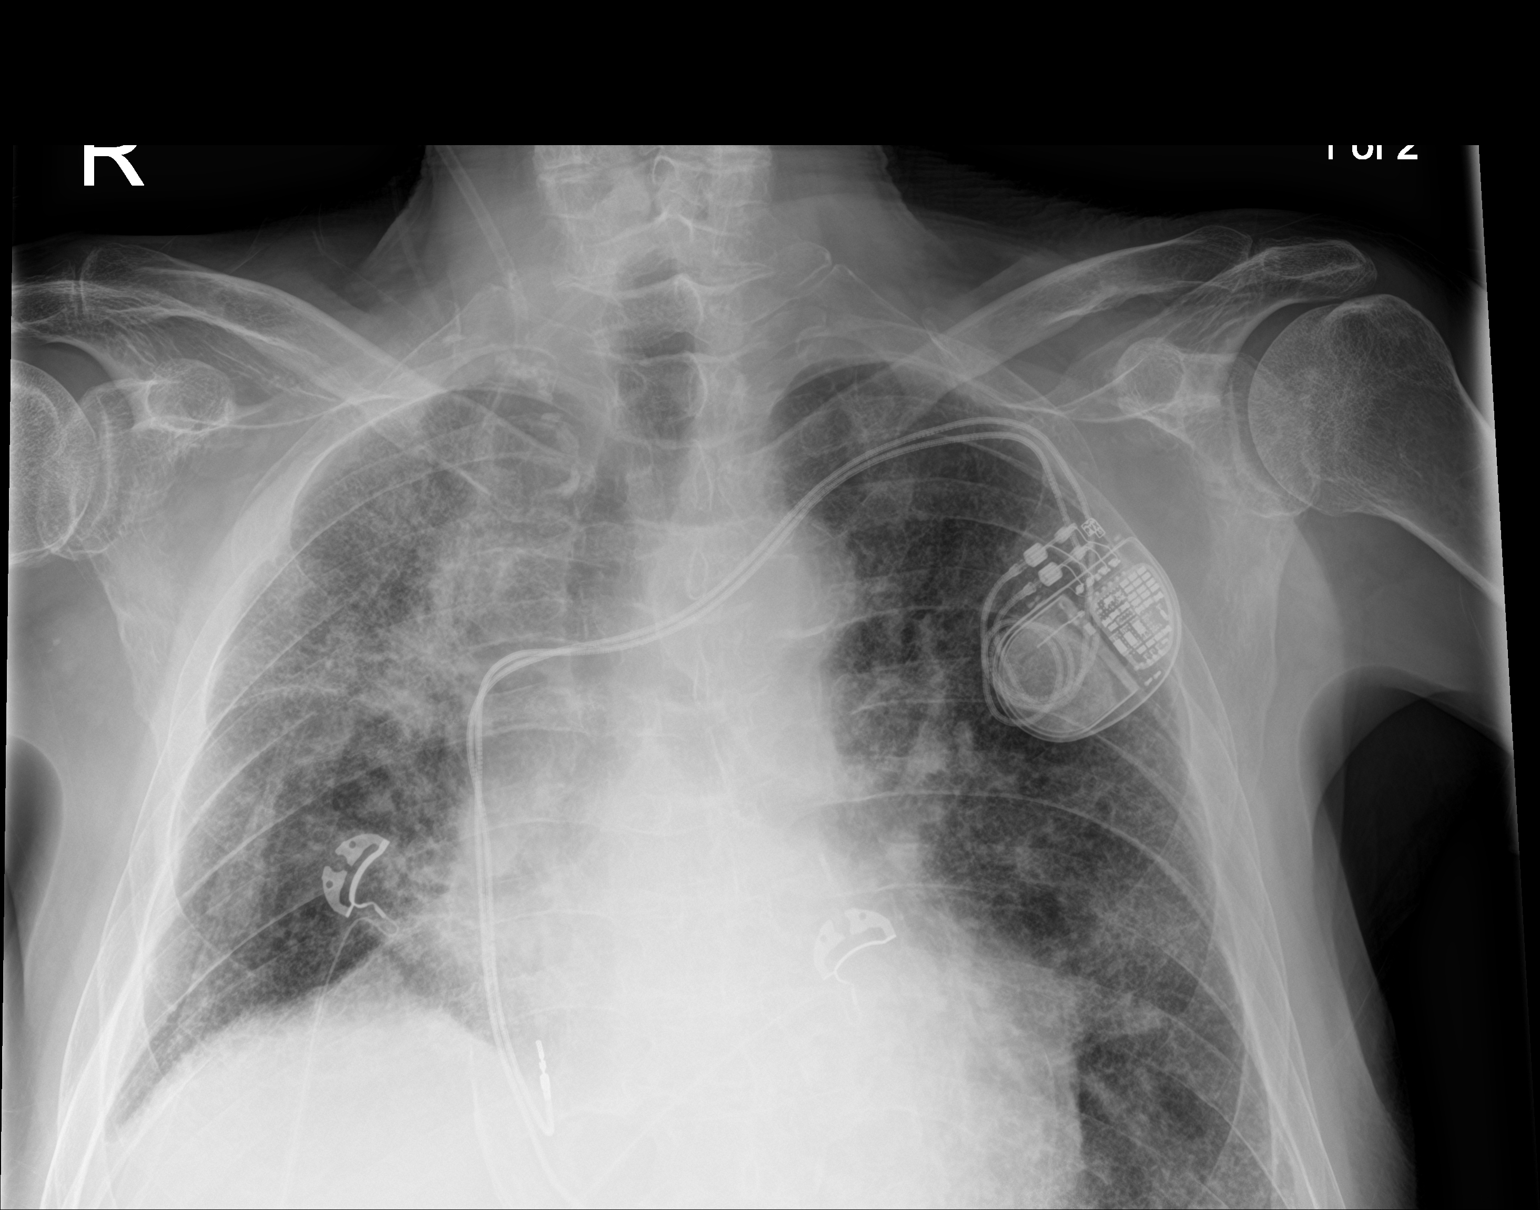

[chest ap (2 of 2)]
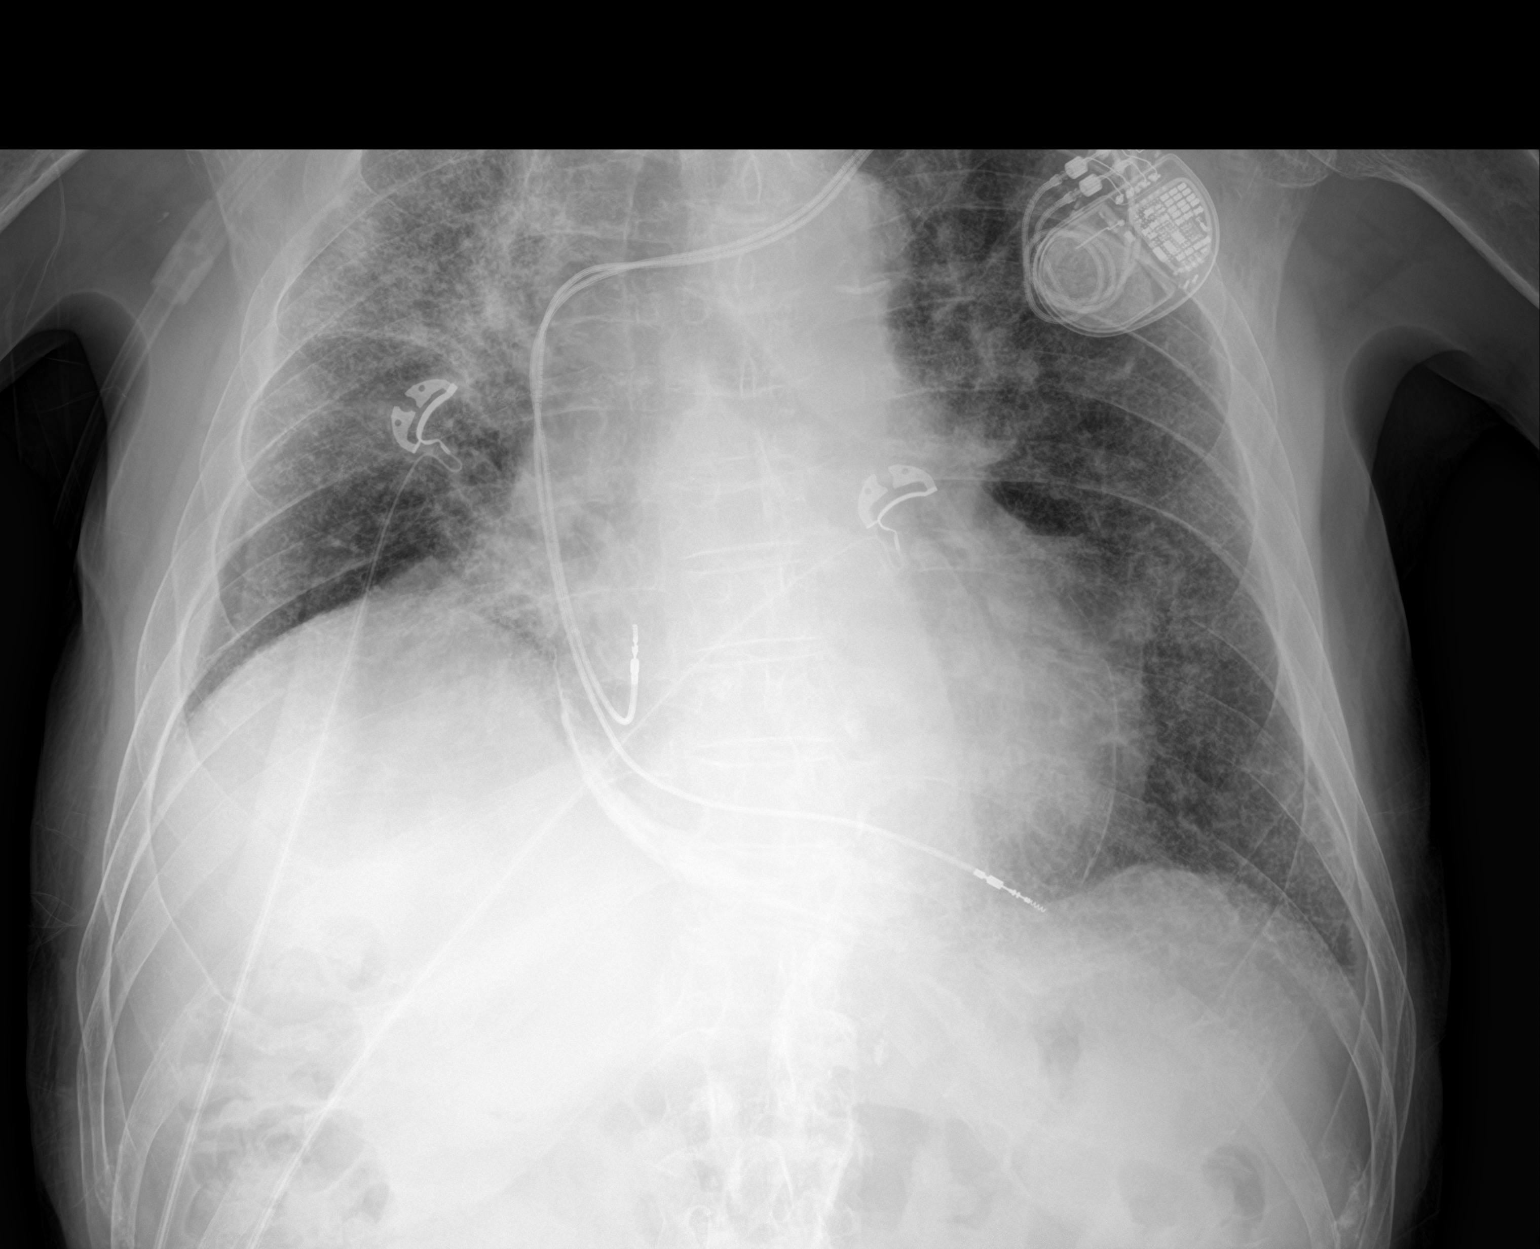

[2 of 2 positions shown; findings below may reference images not displayed]

FINDINGS: There is a 2 lead implantable cardiac device with leads terminating
in the right atrium and right ventricle. Again seen are pericardial
calcifications. Improved effusion on the right side. No
pneumothorax. There remains bilateral interstitial and pulmonary
opacities as seen on recent cross-sectional CT of the chest. No
acute osseous abnormality.
IMPRESSION: Improved right-sided effusion after right-sided thoracentesis. No
pneumothorax. Similar appearance of bilateral pulmonary opacities.

## 2021-04-10 IMAGING — US IR THORACENTESIS ASP PLEURAL SPACE W/IMG GUIDE
1 series · 12 of 12 positions shown · non-contrast
Comparison: none

INDICATION: Patient with a history of acute on chronic heart failure and small
cell lung cancer presents today with bilateral pleural effusion.
Interventional Radiology asked to perform a therapeutic and
diagnostic thoracentesis.

[Series 1: ir (id) (id)/(id)/(id) ir · 12 of 12 slices shown]
[im 1/12]
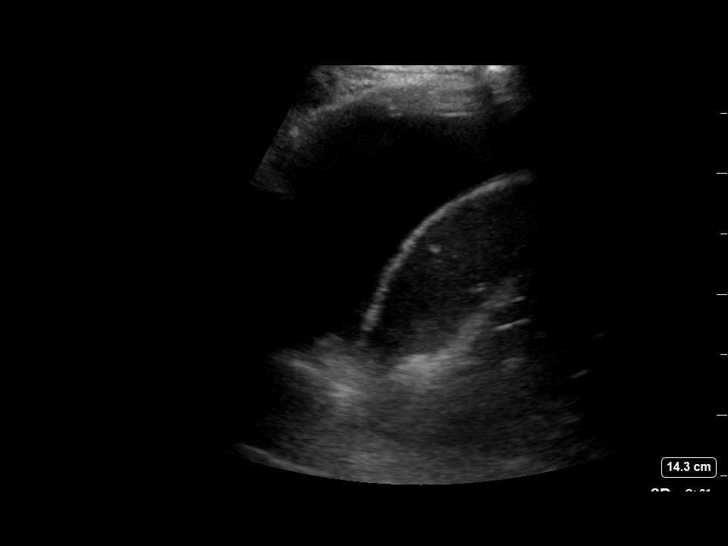
[im 2/12]
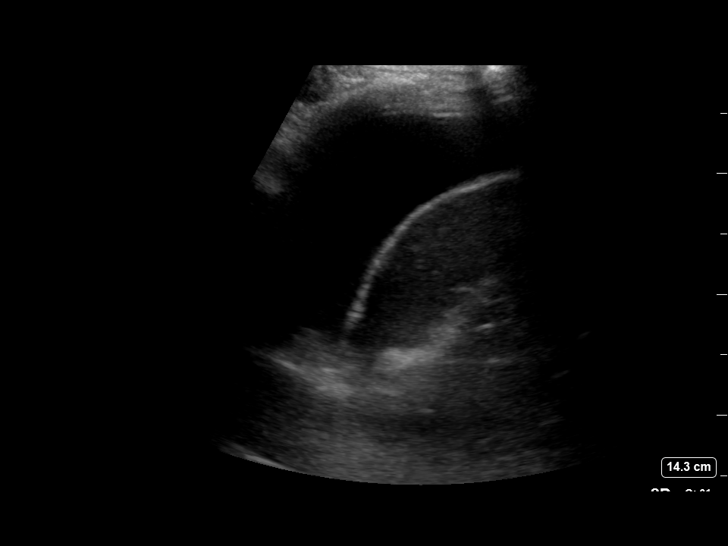
[im 3/12]
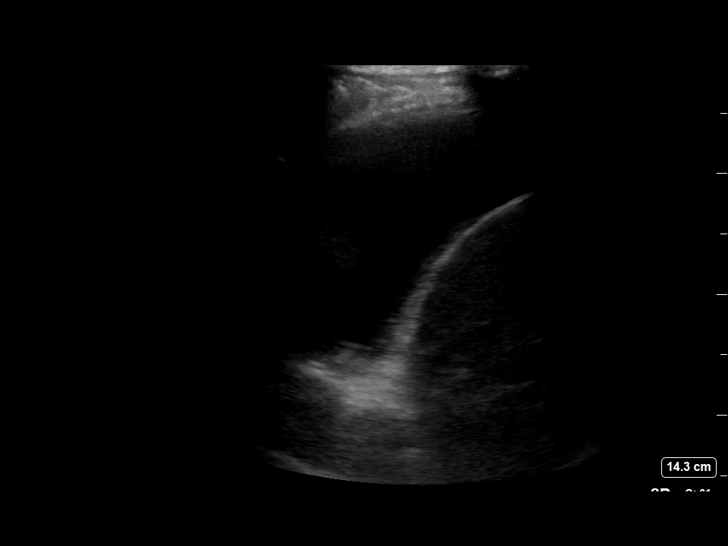
[im 4/12]
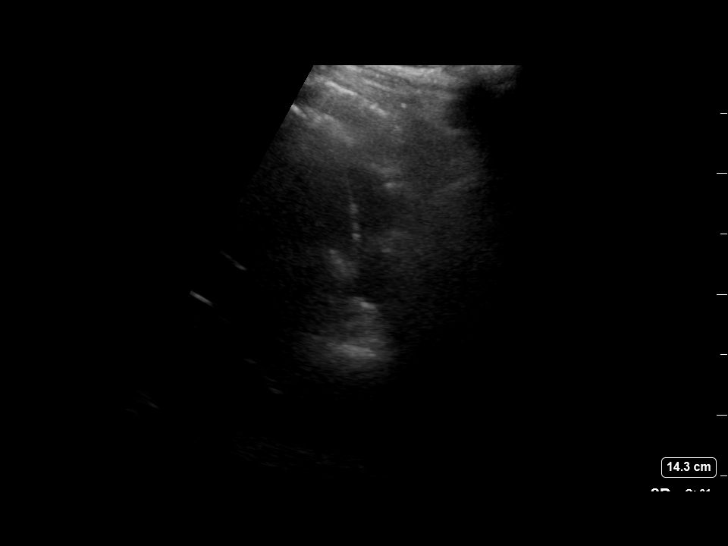
[im 5/12]
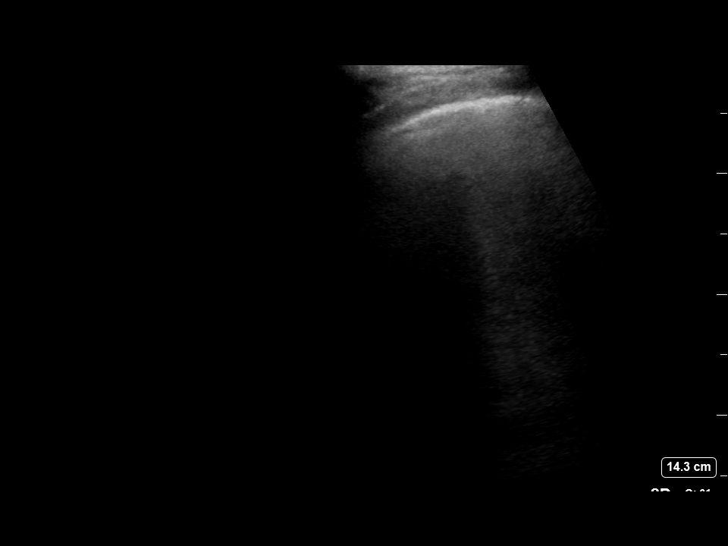
[im 6/12]
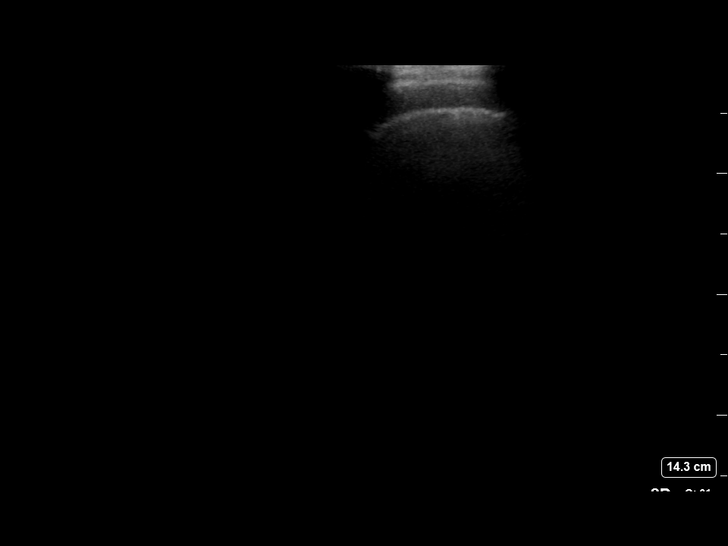
[im 7/12]
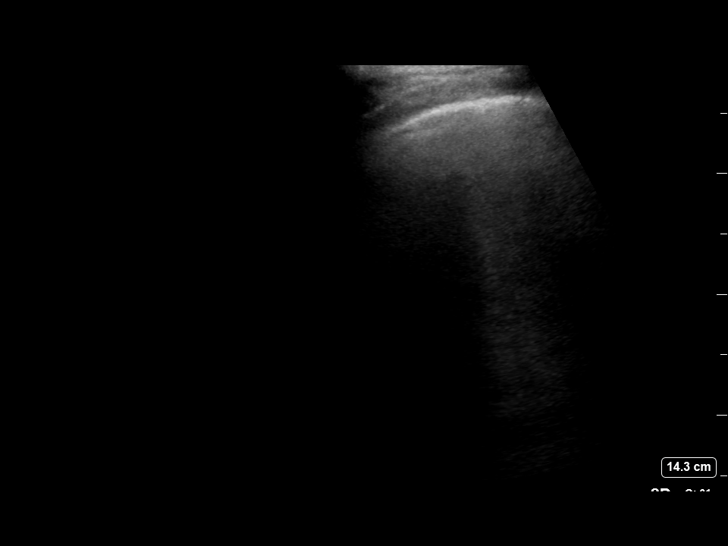
[im 8/12]
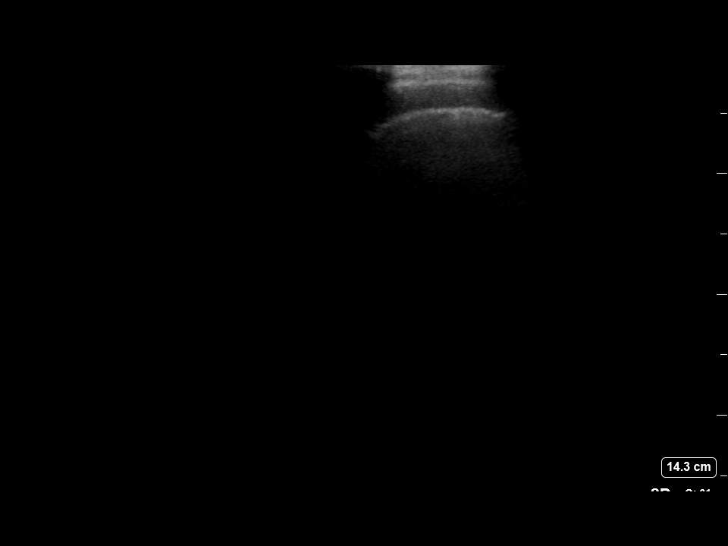
[im 9/12]
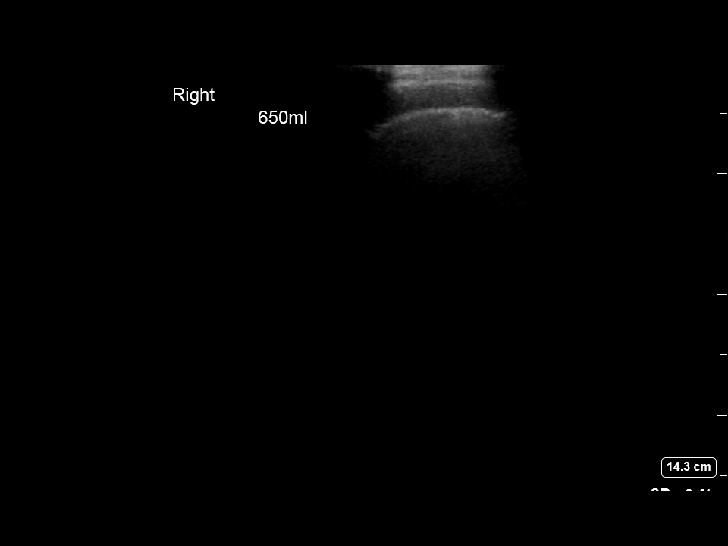
[im 10/12]
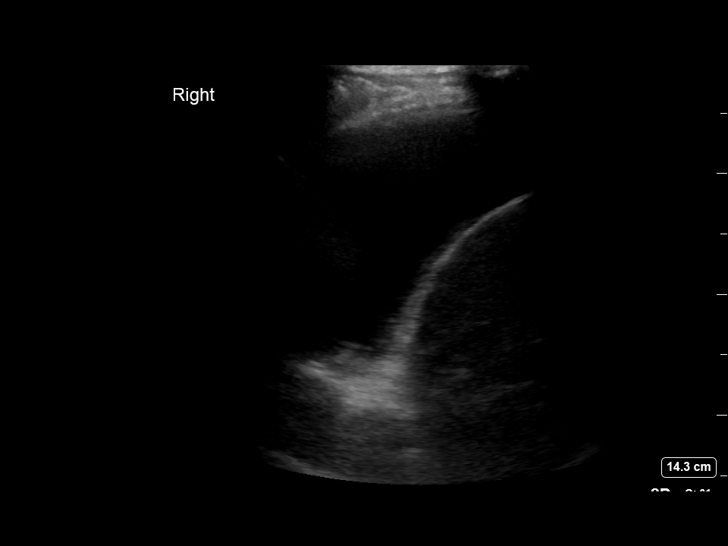
[im 11/12]
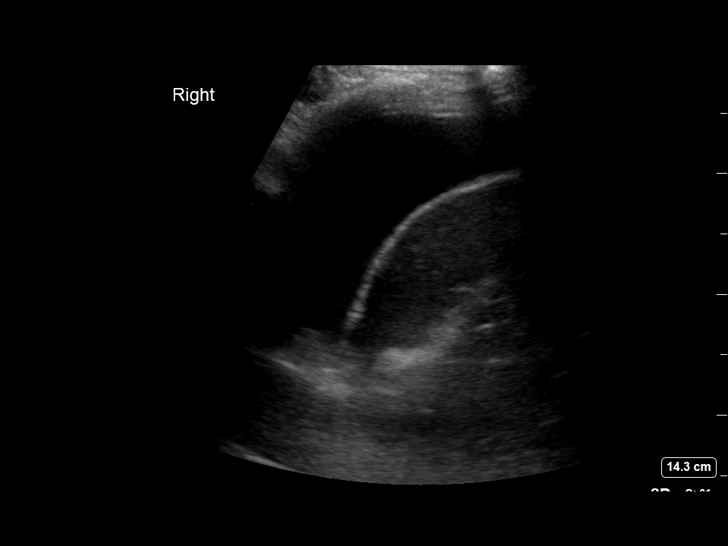
[im 12/12]
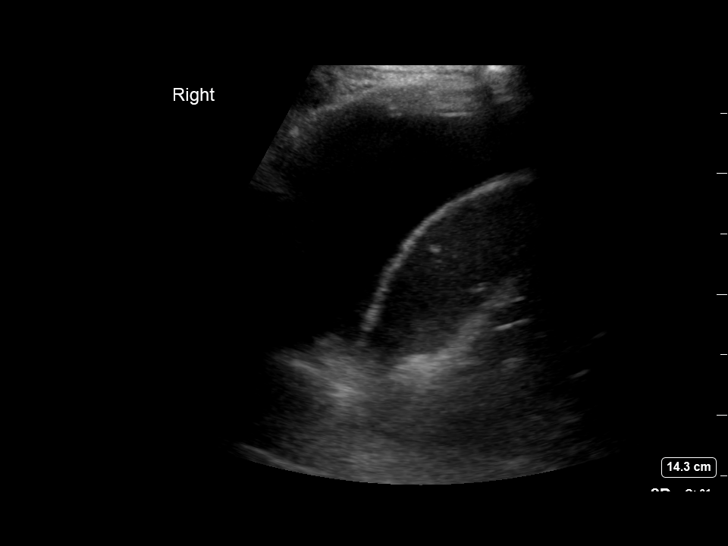

[12 of 12 positions shown; findings below may reference images not displayed]

EXAM:
ULTRASOUND GUIDED THORACENTESIS

MEDICATIONS:
1% lidocaine 10 mL

COMPLICATIONS:
None immediate.

PROCEDURE:
An ultrasound guided thoracentesis was thoroughly discussed with the
patient and questions answered. The benefits, risks, alternatives
and complications were also discussed. The patient understands and
wishes to proceed with the procedure. Written consent was obtained.

Ultrasound was performed to localize and mark an adequate pocket of
fluid in the right chest. The area was then prepped and draped in
the normal sterile fashion. 1% Lidocaine was used for local
anesthesia. Under ultrasound guidance a 6 Fr Safe-T-Centesis
catheter was introduced. Thoracentesis was performed. The catheter
was removed and a dressing applied.
FINDINGS: A total of approximately 650 mL of amber-colored fluid was removed.
Samples were sent to the laboratory as requested by the clinical
team.
IMPRESSION: Successful ultrasound guided right thoracentesis yielding 650 mL of
pleural fluid. Read by: OCHIN PAKHI, NP

## 2021-04-10 MED ORDER — LIDOCAINE HCL 1 % IJ SOLN
INTRAMUSCULAR | Status: AC
  Filled 2021-04-10: qty 20

## 2021-04-10 MED ORDER — POTASSIUM CHLORIDE CRYS ER 20 MEQ PO TBCR
40.0000 meq | EXTENDED_RELEASE_TABLET | Freq: Every day | ORAL | Status: DC
  Administered 2021-04-10 – 2021-04-16 (×7): 40 meq via ORAL
  Filled 2021-04-10 (×8): qty 2

## 2021-04-10 NOTE — Progress Notes (Signed)
Remote pacemaker transmission.   

## 2021-04-10 NOTE — Procedures (Signed)
PROCEDURE SUMMARY:  Successful US guided right thoracentesis. Yielded 650 ml of amber-colored fluid. Pt tolerated procedure well. No immediate complications.  Specimen sent for labs. CXR ordered; no post-procedure pneumothorax identified  EBL < 2 mL  Theresa Duty, NP 04/10/2021 11:32 AM

## 2021-04-10 NOTE — Progress Notes (Signed)
PROGRESS NOTE    [name], Dr.  WUJ:[id number] DOB: Mar 11, 1941 DOA: 04/09/2021 PCP: [name], MD   Brief Narrative:  The patient is an 80 year old Caucasian male with a past medical history significant for but not limited to complete heart block with pacemaker placement, atrial fibrillation on Tikosyn and Xarelto, hypertension, small cell lung cancer status post wedge resection status post chemotherapy and radiation who presented to the emergency room for evaluation of progressive worsening shortness of breath.  Patient recently completed radiation in July and is currently under observation with his oncologist but reports worsening shortness of breath and fatigue for over a little month.  He had been started on prednisone and did not notice much improvement but reported significant worsening when he began to decrease the dose.  He has gone back to 40 mg of prednisone daily but continued to be more dyspneic and has a cough with scant sputum production which is often pink-tinged.  He has had no fevers or chills, chest pain or leg swelling.  He was previously prescribed torsemide but reports that he not been taking it regularly since his legs were not swollen that because his weight has been fairly stable.  He did note a 2 pound weight gain but no swelling.  He was seen at an outside emergency department on 04/05/2021 for worsening dyspnea and was diagnosed with bilateral pneumonia and was started on doxycycline and supplemental oxygen.  He continues to worsen despite this and presented to [place] and found to be saturating in the mid 90s on 3 L of supplemental oxygen and is mildly tachypneic.  BNP was elevated 336 and CT of the chest was done which was negative for PE but was notable for groundglass opacities and interlobular septal thickening on background of fibrotic changes with increased bilateral pleural effusions concerning for pulmonary edema with reflux of contrast into hepatic  veins and possible infection.  Blood cultures obtained and he was admitted and treated with IV Lasix, cefepime and vancomycin and transferred to [facility] for further evaluation management.  After further discussion with the patient patient actually has supplemental oxygen at home and only wears 2 L as needed.  He has been admitted for acute on chronic hypoxic respiratory failure in setting of acute on chronic heart failure with preserved ejection fraction with bilateral pleural effusions and a history of swelling so wound cancer as well as concomitant pneumonia present on admission.  Assessment & Plan:   Principal Problem:   Acute respiratory failure with hypoxia ([name]) Active Problems:   Small cell lung cancer, right lower lobe ([name])   Persistent atrial fibrillation ([name])   Acute on chronic diastolic CHF (congestive heart failure) ([name])   Bilateral pleural effusion   Thrombocytopenia ([name])   Thoracic ascending aortic aneurysm ([name])   Acute on chronic hypoxic respiratory failure in the setting of acute on chronic heart failure with preserved ejection fraction with associated bilateral pleural effusions and concomitant pneumonia in a patient with a history of SCLC and asthma -Patient has a history of heart failure with preserved ejection fraction at [facility] status postresection, chemotherapy and radiation and presented with progressive shortness of breath after being diagnosed with pneumonia on 04/05/2021 despite being started on supplemental oxygen and doxycycline -CT of the chest was done and showed a large right and moderate left pleural effusions with GGO and septal thickening of the background fibrosis -BNP was elevated on admission at 338 -He is no chills or fevers or leukocytosis -Blood cultures  were obtained in the ED and he was treated with IV Lasix, vancomycin and cefepime -IR has been consulted and patient underwent a thoracentesis today with fluid analysis including cytology -Continue 20  mg IV Lasix every 12 and continue prednisone and antibiotics with IV Rocephin and IV doxycycline for now -Strict I's and O's and daily weights -Patient is -4.649 L since admission -Procalcitonin level is less than 0.10 -We will continue supplemental oxygen via nasal cannula -SpO2: 95 % O2 Flow Rate (L/min): 2.5 L/min -Continue with benzonatate 200 mg p.o. 3 times daily as needed for cough as well as albuterol 3 mL inhalations every 6 as needed for wheezing or shortness of breath -Continuous pulse oximetry maintain O2 saturation greater 90% -Continue supplemental oxygen via nasal cannula and wean O2 as tolerated -We will need an amatory home O2 screen prior to discharge and repeat chest x-ray in a.m. -If not improving may need to involve pulmonary and will notify his oncologist as a courtesy  Paroxysmal atrial fibrillation -Continue with Tikosyn and Xarelto as well as metoprolol succinate 12.5 mg p.o. daily -Continue telemetry monitoring -He has been in sinus rhythm since admission  Thrombocytopenia -Appears stable with no active bleeding -Patient's platelet count went from 110 and trended down to 107 and is now is 105 -Continue to monitor for signs and symptoms bleeding repeat CBC in a.m.  Normocytic anemia -Hemoglobin/markers appear stable 11.0/34.2 -Check anemia panel in a.m. -Likely anemia of chronic disease -Continue monitor for signs and symptoms of bleeding; currently no overt bleeding noted -Repeat CBC in a.m.  Hyperbilirubinemia -Mild and likely reactive -Patient's T bili went from 2.8 and trended down to 2.0 -Continue to monitor and trend and repeat CMP in a.m.  Hyponatremia -Likely in the setting of volume overload -Patient's sodium went from 132 and trended up to 134 -Continue with diuresis as above -Continue monitor and trend and repeat CMP in a.m.  GERD/GI prophylaxis -Continue with esomeprazole substitution with pantoprazole 40 mg p.o.  daily  Hyperlipidemia -Continue with atorvastatin 20 once p.o. daily  BPH -Continue with doxazosin 4 mg p.o. daily  Goals of care; DNR, present on admission  DVT prophylaxis: Anticoagulated with Xarelto Code Status: DO NOT RESUSCITATE Family Communication: No family present at bedside Disposition Plan: Pending further clinical improvement and weaning of supplemental oxygen we will repeat chest x-ray in a.m.  Status is: Inpatient  Remains inpatient appropriate because:Unsafe d/c plan, IV treatments appropriate due to intensity of illness or inability to take PO, and Inpatient level of care appropriate due to severity of illness  Dispo: The patient is from: Home              Anticipated d/c is to: Home              Patient currently is not medically stable to d/c.   Difficult to place patient No  Consultants:  IR  Procedures:  Thoracentesis  Successful US guided right thoracentesis. Yielded 650 ml of amber-colored fluid. Pt tolerated procedure well. No immediate complications.  Antimicrobials:  Anti-infectives (From admission, onward)    Start     Dose/Rate Route Frequency Ordered Stop   04/10/21 0900  vancomycin (VANCOREADY) IVPB 1500 mg/300 mL  Status:  Discontinued        1,500 mg 150 mL/hr over 120 Minutes Intravenous Every 24 hours 04/09/21 1739 04/09/21 2122   04/10/21 0600  vancomycin (VANCOREADY) IVPB 1500 mg/300 mL  Status:  Discontinued        1,500 mg  150 mL/hr over 120 Minutes Intravenous Every 24 hours 04/09/21 1402 04/09/21 1739   04/09/21 2300  cefTRIAXone (ROCEPHIN) 2 g in sodium chloride 0.9 % 100 mL IVPB        2 g 200 mL/hr over 30 Minutes Intravenous Every 24 hours 04/09/21 2122 04/14/21 2159   04/09/21 2300  doxycycline (VIBRAMYCIN) 100 mg in sodium chloride 0.9 % 250 mL IVPB        100 mg 125 mL/hr over 120 Minutes Intravenous Every 12 hours 04/09/21 2154     04/09/21 2215  azithromycin (ZITHROMAX) 500 mg in sodium chloride 0.9 % 250 mL IVPB   Status:  Discontinued        500 mg 250 mL/hr over 60 Minutes Intravenous Every 24 hours 04/09/21 2122 04/09/21 2154   04/09/21 1415  vancomycin (VANCOCIN) IVPB 1000 mg/200 mL premix        1,000 mg 200 mL/hr over 60 Minutes Intravenous  Once 04/09/21 1402 04/09/21 1718   04/09/21 1400  ceFEPIme (MAXIPIME) 2 g in sodium chloride 0.9 % 100 mL IVPB        2 g 200 mL/hr over 30 Minutes Intravenous  Once 04/09/21 1347 04/09/21 1509        Subjective: Seen and examined after his thoracentesis and he thinks he is doing little bit better.  Had a little bit of cough.  No chest pain or shortness of breath and thinks his breathing is improved.  No lightheadedness or dizziness.  No other concerns or plans at this time.  Objective: Vitals:   04/09/21 2110 04/10/21 0614 04/10/21 1100 04/10/21 1447  BP: (!) 156/90 115/80 103/69 108/71  Pulse: 83  81 81  Resp: 20  18 17   Temp: 98 F (36.7 C) 98.6 F (37 C) 98.8 F (37.1 C) 98.9 F (37.2 C)  TempSrc: Oral Oral Oral Oral  SpO2: 96%  94% 95%  Weight:      Height:        Intake/Output Summary (Last 24 hours) at 04/10/2021 1842 Last data filed at 04/10/2021 1800 Gross per 24 hour  Intake 350 ml  Output 5100 ml  Net -4750 ml   Filed Weights   04/09/21 1035  Weight: 68.9 kg    Examination: Physical Exam:  Constitutional: Thin elderly chronically ill-appearing Caucasian male currently no acute distress Eyes: Lids and conjunctivae normal, sclerae anicteric  ENMT: External Ears, Nose appear normal. Grossly normal hearing.  Neck: Appears normal, supple, no cervical masses, normal ROM, no appreciable thyromegaly; mild JVD Respiratory:, Diminished to auscultation bilaterally with some coarse breath sounds and some mild rhonchi and crackles. Normal respiratory effort and patient is not tachypenic. No accessory muscle use.  Unlabored breathing but wearing supplemental oxygen via nasal cannula Cardiovascular: RRR, no murmurs / rubs / gallops. S1  and S2 auscultated.  Trace extremity edema Abdomen: Soft, non-tender, non-distended. Bowel sounds positive.  GU: Deferred. Musculoskeletal: No clubbing / cyanosis of digits/nails. No joint deformity upper and lower extremities.  Skin: No rashes, lesions, ulcers. No induration; Warm and dry.  Neurologic: CN 2-12 grossly intact with no focal deficits. Romberg sign and cerebellar reflexes not assessed.  Psychiatric: Normal judgment and insight. Alert and oriented x 3. Normal mood and appropriate affect.   Data Reviewed: I have personally reviewed following labs and imaging studies  CBC: Recent Labs  Lab 04/09/21 1047 04/10/21 0337  WBC 7.3 7.9  NEUTROABS 6.6  --   HGB 10.9* 11.0*  HCT 33.6* 34.2*  MCV 85.5  85.9  PLT 107* 875*   Basic Metabolic Panel: Recent Labs  Lab 04/09/21 1047 04/10/21 0337  NA 132* 134*  K 3.7 3.8  CL 100 100  CO2 24 27  GLUCOSE 164* 80  BUN 21 14  CREATININE 0.74 0.77  CALCIUM 9.8 9.8  MG  --  1.9   GFR: Estimated Creatinine Clearance: 69.4 mL/min (by C-G formula based on SCr of 0.77 mg/dL). Liver Function Tests: Recent Labs  Lab 04/09/21 1047 04/10/21 0337  AST 22 21  ALT 26 27  ALKPHOS 369* 344*  BILITOT 2.8* 2.0*  PROT 6.4* 6.1*  ALBUMIN 3.7 3.1*   No results for input(s): LIPASE, AMYLASE in the last 168 hours. No results for input(s): AMMONIA in the last 168 hours. Coagulation Profile: No results for input(s): INR, PROTIME in the last 168 hours. Cardiac Enzymes: No results for input(s): CKTOTAL, CKMB, CKMBINDEX, TROPONINI in the last 168 hours. BNP (last 3 results) Recent Labs    10/03/20 1340  PROBNP 471   HbA1C: No results for input(s): HGBA1C in the last 72 hours. CBG: No results for input(s): GLUCAP in the last 168 hours. Lipid Profile: No results for input(s): CHOL, HDL, LDLCALC, TRIG, CHOLHDL, LDLDIRECT in the last 72 hours. Thyroid Function Tests: No results for input(s): TSH, T4TOTAL, FREET4, T3FREE, THYROIDAB in  the last 72 hours. Anemia Panel: No results for input(s): VITAMINB12, FOLATE, FERRITIN, TIBC, IRON, RETICCTPCT in the last 72 hours. Sepsis Labs: Recent Labs  Lab 04/09/21 2209 04/10/21 0337  PROCALCITON <0.10 <0.10    Recent Results (from the past 240 hour(s))  Resp Panel by RT-PCR (Flu A&B, Covid) Nasopharyngeal Swab     Status: None   Collection Time: 04/09/21 11:47 AM   Specimen: Nasopharyngeal Swab; Nasopharyngeal(NP) swabs in vial transport medium  Result Value Ref Range Status   SARS Coronavirus 2 by RT PCR NEGATIVE NEGATIVE Final    Comment: (NOTE) SARS-CoV-2 target nucleic acids are NOT DETECTED.  The SARS-CoV-2 RNA is generally detectable in upper respiratory specimens during the acute phase of infection. The lowest concentration of SARS-CoV-2 viral copies this assay can detect is 138 copies/mL. A negative result does not preclude SARS-Cov-2 infection and should not be used as the sole basis for treatment or other patient management decisions. A negative result may occur with  improper specimen collection/handling, submission of specimen other than nasopharyngeal swab, presence of viral mutation(s) within the areas targeted by this assay, and inadequate number of viral copies(<138 copies/mL). A negative result must be combined with clinical observations, patient history, and epidemiological information. The expected result is Negative.  Fact Sheet for Patients:  EntrepreneurPulse.com.au  Fact Sheet for Healthcare Providers:  IncredibleEmployment.be  This test is no t yet approved or cleared by the [place] FDA and  has been authorized for detection and/or diagnosis of SARS-CoV-2 by FDA under an Emergency Use Authorization (EUA). This EUA will remain  in effect (meaning this test can be used) for the duration of the COVID-19 declaration under Section 564(b)(1) of the Act, 21 U.S.C.section 360bbb-3(b)(1), unless the  authorization is terminated  or revoked sooner.       Influenza A by PCR NEGATIVE NEGATIVE Final   Influenza B by PCR NEGATIVE NEGATIVE Final    Comment: (NOTE) The Xpert Xpress SARS-CoV-2/FLU/RSV plus assay is intended as an aid in the diagnosis of influenza from Nasopharyngeal swab specimens and should not be used as a sole basis for treatment. Nasal washings and aspirates are unacceptable for Xpert Xpress  SARS-CoV-2/FLU/RSV testing.  Fact Sheet for Patients: EntrepreneurPulse.com.au  Fact Sheet for Healthcare Providers: IncredibleEmployment.be  This test is not yet approved or cleared by the [place] FDA and has been authorized for detection and/or diagnosis of SARS-CoV-2 by FDA under an Emergency Use Authorization (EUA). This EUA will remain in effect (meaning this test can be used) for the duration of the COVID-19 declaration under Section 564(b)(1) of the Act, 21 U.S.C. section 360bbb-3(b)(1), unless the authorization is terminated or revoked.  Performed at [facility], [address] [place], [place] [address]   Culture, blood (Routine X 2) w Reflex to ID Panel     Status: None (Preliminary result)   Collection Time: 04/09/21  2:25 PM   Specimen: BLOOD  Result Value Ref Range Status   Specimen Description BLOOD LEFT ANTECUBITAL  Final   Special Requests   Final    BOTTLES DRAWN AEROBIC AND ANAEROBIC Blood Culture adequate volume   Culture   Final    NO GROWTH < 24 HOURS Performed at [facility], [place] [address] [place], [place] [address]    Report Status PENDING  Incomplete  Culture, blood (Routine X 2) w Reflex to ID Panel     Status: None (Preliminary result)   Collection Time: 04/09/21  2:25 PM   Specimen: BLOOD  Result Value Ref Range Status   Specimen Description BLOOD RIGHT ANTECUBITAL  Final   Special Requests   Final    BOTTLES DRAWN AEROBIC AND ANAEROBIC Blood Culture adequate volume    Culture   Final    NO GROWTH < 24 HOURS Performed at [facility], [place] [address], [place], [place] [address]    Report Status PENDING  Incomplete  Gram stain     Status: None   Collection Time: 04/10/21 11:04 AM   Specimen: Lung, Right; Pleural Fluid  Result Value Ref Range Status   Specimen Description FLUID RIGHT PLEURAL  Final   Special Requests NONE  Final   Gram Stain   Final    MODERATE WBC PRESENT,BOTH PMN AND MONONUCLEAR NO ORGANISMS SEEN Performed at [facility], [address] [place], [place] [address]    Report Status 04/10/2021 FINAL  Final    RN Pressure Injury Documentation:     Estimated body mass index is 22.45 kg/m as calculated from the following:   Height as of this encounter: 5\' 9"  (1.753 m).   Weight as of this encounter: 68.9 kg.  Malnutrition Type:   Malnutrition Characteristics:   Nutrition Interventions:     Radiology Studies: DG Chest 1 View  Result Date: 04/10/2021 CLINICAL DATA:  Status right post thoracentesis EXAM: CHEST  1 VIEW COMPARISON:  Multiple priors FINDINGS: There is a 2 lead implantable cardiac device with leads terminating in the right atrium and right ventricle. Again seen are pericardial calcifications. Improved effusion on the right side. No pneumothorax. There remains bilateral interstitial and pulmonary opacities as seen on recent cross-sectional CT of the chest. No acute osseous abnormality. IMPRESSION: Improved right-sided effusion after right-sided thoracentesis. No pneumothorax. Similar appearance of bilateral pulmonary opacities. Electronically Signed   By: Albin Felling M.D.   On: 04/10/2021 11:25   CT Angio Chest PE W/Cm &/Or Wo Cm  Result Date: 04/09/2021 CLINICAL DATA:  PE suspected, shortness of breath EXAM: CT ANGIOGRAPHY CHEST WITH CONTRAST TECHNIQUE: Multidetector CT imaging of the chest was performed using the standard protocol during bolus administration of intravenous contrast. Multiplanar CT image  reconstructions and MIPs were obtained to evaluate the vascular anatomy. CONTRAST:  23mL OMNIPAQUE IOHEXOL 350 MG/ML SOLN COMPARISON:  03/10/2021 CT chest FINDINGS: Cardiovascular: Satisfactory opacification of the pulmonary arteries to the segmental level. No evidence of pulmonary embolism. The heart is normal in size. Redemonstrated extensive pericardial calcifications. Aortic atherosclerosis, extending into the aortic arch branch vessels. Three-vessel coronary artery calcifications. Redemonstrated aortic ectasia, measuring up to 4.2 cm. Left chest pacemaker with leads in the right atrium and right ventricle. Reflux of contrast into the IVC and hepatic veins. Mediastinum/Nodes: No enlarged mediastinal, hilar, or axillary lymph nodes. Thyroid gland, trachea, and esophagus demonstrate no significant findings. Lungs/Pleura: Postoperative changes of wedge resection of the superior segment of the right lower lobe. Increased areas of ground-glass attenuation and septal thickening superimposed on previously noted fibrotic changes. Increased right pleural effusion, large, and left pleural effusion now moderate. No pneumothorax. Upper Abdomen: No acute abnormality. Musculoskeletal: No chest wall abnormality. No acute or significant osseous findings. Redemonstrated compression deformity of T11. Review of the MIP images confirms the above findings. IMPRESSION: 1. Increased ground-glass opacities and interlobular septal thickening, superimposed on background fibrotic changes, with increased bilateral pleural effusions, concerning for pulmonary edema, although superimposed infection can not be excluded. 2. Reflux of contrast into the hepatic veins, which can be seen in the setting of heart failure. Correlate with BNP. 3. Negative for pulmonary embolism to the level of the segmental arteries. 4. Ectasia of the ascending thoracic aorta, measuring to 4.2 cm in diameter. Recommend annual imaging followup by CTA or MRA. This  recommendation follows 2010 ACCF/AHA/AATS/ACR/ASA/SCA/SCAI/SIR/STS/SVM Guidelines for the Diagnosis and Management of Patients with Thoracic Aortic Disease. Circulation. 2010; 121: Y403-K742. Aortic aneurysm NOS (ICD10-I71.9) 5.  Aortic Atherosclerosis (ICD10-I70.0). Electronically Signed   By: Merilyn Baba M.D.   On: 04/09/2021 13:37   DG Chest Port 1 View  Result Date: 04/09/2021 CLINICAL DATA:  80 year old male with shortness of breath. Recently diagnosed with pneumonia. History of lung cancer and interstitial lung disease. EXAM: PORTABLE CHEST 1 VIEW COMPARISON:  Chest CT 03/10/2021 and earlier. FINDINGS: Portable AP upright view at 1106 hours. Lower lung volumes. Stable cardiac size and mediastinal contours. Chronic pericardial calcification. Stable left chest dual lead cardiac pacemaker. Increased vague right upper lobe and streaky left lung base opacity from earlier this year. Small pleural effusions seen by CT last month likely persist. No pneumothorax. Lung markings elsewhere appears stable. IMPRESSION: Chronic lung disease with lower lung volumes. Increased right upper lobe and left lung base opacity is nonspecific but could indicate acute infectious exacerbation. Query signs/symptoms of pneumonia. And small pleural effusion seen by CT last month likely persist. Electronically Signed   By: Genevie Ann M.D.   On: 04/09/2021 11:33   IR THORACENTESIS ASP PLEURAL SPACE W/IMG GUIDE  Result Date: 04/10/2021 INDICATION: Patient with a history of acute on chronic heart failure and small cell lung cancer presents today with bilateral pleural effusion. Interventional Radiology asked to perform a therapeutic and diagnostic thoracentesis. EXAM: ULTRASOUND GUIDED THORACENTESIS MEDICATIONS: 1% lidocaine 10 mL COMPLICATIONS: None immediate. PROCEDURE: An ultrasound guided thoracentesis was thoroughly discussed with the patient and questions answered. The benefits, risks, alternatives and complications were also  discussed. The patient understands and wishes to proceed with the procedure. Written consent was obtained. Ultrasound was performed to localize and mark an adequate pocket of fluid in the right chest. The area was then prepped and draped in the normal sterile fashion. 1% Lidocaine was used for local anesthesia. Under ultrasound  guidance a 6 Fr Safe-T-Centesis catheter was introduced. Thoracentesis was performed. The catheter was removed and a dressing applied. FINDINGS: A total of approximately 650 mL of amber-colored fluid was removed. Samples were sent to the laboratory as requested by the clinical team. IMPRESSION: Successful ultrasound guided right thoracentesis yielding 650 mL of pleural fluid. Read by: Soyla Dryer, NP Electronically Signed   By: Ruthann Cancer M.D.   On: 04/10/2021 11:32    Scheduled Meds:  atorvastatin  20 mg Oral Daily   dofetilide  250 mcg Oral BID   doxazosin  4 mg Oral Daily   furosemide  20 mg Intravenous Q12H   lidocaine       memantine  10 mg Oral BID   metoprolol succinate  12.5 mg Oral QHS   montelukast  10 mg Oral [id number]   pantoprazole  40 mg Oral Daily   potassium chloride  40 mEq Oral Daily   rivaroxaban  20 mg Oral Q supper   Continuous Infusions:  cefTRIAXone (ROCEPHIN)  IV 2 g (04/09/21 2245)   doxycycline (VIBRAMYCIN) IV 100 mg (04/10/21 1136)    LOS: 1 day   Kerney Elbe, DO [facility] PAGER is on AMION  If 7PM-7AM, please contact night-coverage [email or web address]

## 2021-04-10 NOTE — Progress Notes (Signed)
Returned to unit from IR. No complaints from patient pain 0/10.

## 2021-04-11 ENCOUNTER — Inpatient Hospital Stay (HOSPITAL_COMMUNITY): Payer: Medicare Other

## 2021-04-11 LAB — COMPREHENSIVE METABOLIC PANEL
ALT: 23 U/L (ref 0–44)
AST: 17 U/L (ref 15–41)
Albumin: 2.7 g/dL — ABNORMAL LOW (ref 3.5–5.0)
Alkaline Phosphatase: 325 U/L — ABNORMAL HIGH (ref 38–126)
Anion gap: 7 (ref 5–15)
BUN: 18 mg/dL (ref 8–23)
CO2: 26 mmol/L (ref 22–32)
Calcium: 9.7 mg/dL (ref 8.9–10.3)
Chloride: 99 mmol/L (ref 98–111)
Creatinine, Ser: 0.82 mg/dL (ref 0.61–1.24)
GFR, Estimated: >60 mL/min (ref >60)
Glucose, Bld: 100 mg/dL — ABNORMAL HIGH (ref 70–99)
Potassium: 3.8 mmol/L (ref 3.5–5.1)
Sodium: 132 mmol/L — ABNORMAL LOW (ref 135–145)
Total Bilirubin: 2.3 mg/dL — ABNORMAL HIGH (ref 0.3–1.2)
Total Protein: 5.5 g/dL — ABNORMAL LOW (ref 6.5–8.1)

## 2021-04-11 LAB — CBC WITH DIFFERENTIAL/PLATELET
Abs Immature Granulocytes: 0.02 10*3/uL (ref 0.00–0.07)
Basophils Absolute: 0 10*3/uL (ref 0.0–0.1)
Basophils Relative: 0 %
Eosinophils Absolute: 0.1 10*3/uL (ref 0.0–0.5)
Eosinophils Relative: 2 %
HCT: 31.9 % — ABNORMAL LOW (ref 39.0–52.0)
Hemoglobin: 10.3 g/dL — ABNORMAL LOW (ref 13.0–17.0)
Immature Granulocytes: 0 %
Lymphocytes Relative: 4 %
Lymphs Abs: 0.3 10*3/uL — ABNORMAL LOW (ref 0.7–4.0)
MCH: 27.6 pg (ref 26.0–34.0)
MCHC: 32.3 g/dL (ref 30.0–36.0)
MCV: 85.5 fL (ref 80.0–100.0)
Monocytes Absolute: 0.7 10*3/uL (ref 0.1–1.0)
Monocytes Relative: 10 %
Neutro Abs: 5.7 10*3/uL (ref 1.7–7.7)
Neutrophils Relative %: 84 %
Platelets: 122 10*3/uL — ABNORMAL LOW (ref 150–400)
RBC: 3.73 MIL/uL — ABNORMAL LOW (ref 4.22–5.81)
RDW: 18 % — ABNORMAL HIGH (ref 11.5–15.5)
WBC: 6.8 10*3/uL (ref 4.0–10.5)
nRBC: 0 % (ref 0.0–0.2)

## 2021-04-11 LAB — MAGNESIUM: Magnesium: 1.9 mg/dL (ref 1.7–2.4)

## 2021-04-11 LAB — PHOSPHORUS: Phosphorus: 2.8 mg/dL (ref 2.5–4.6)

## 2021-04-11 LAB — PROCALCITONIN: Procalcitonin: 1.41 ng/mL

## 2021-04-11 IMAGING — DX DG CHEST 1V PORT
1 series · 1 of 1 positions shown · non-contrast
Comparison: [DATE]

CLINICAL DATA: Shortness of breath

EXAM:
PORTABLE CHEST 1 VIEW

[chest]
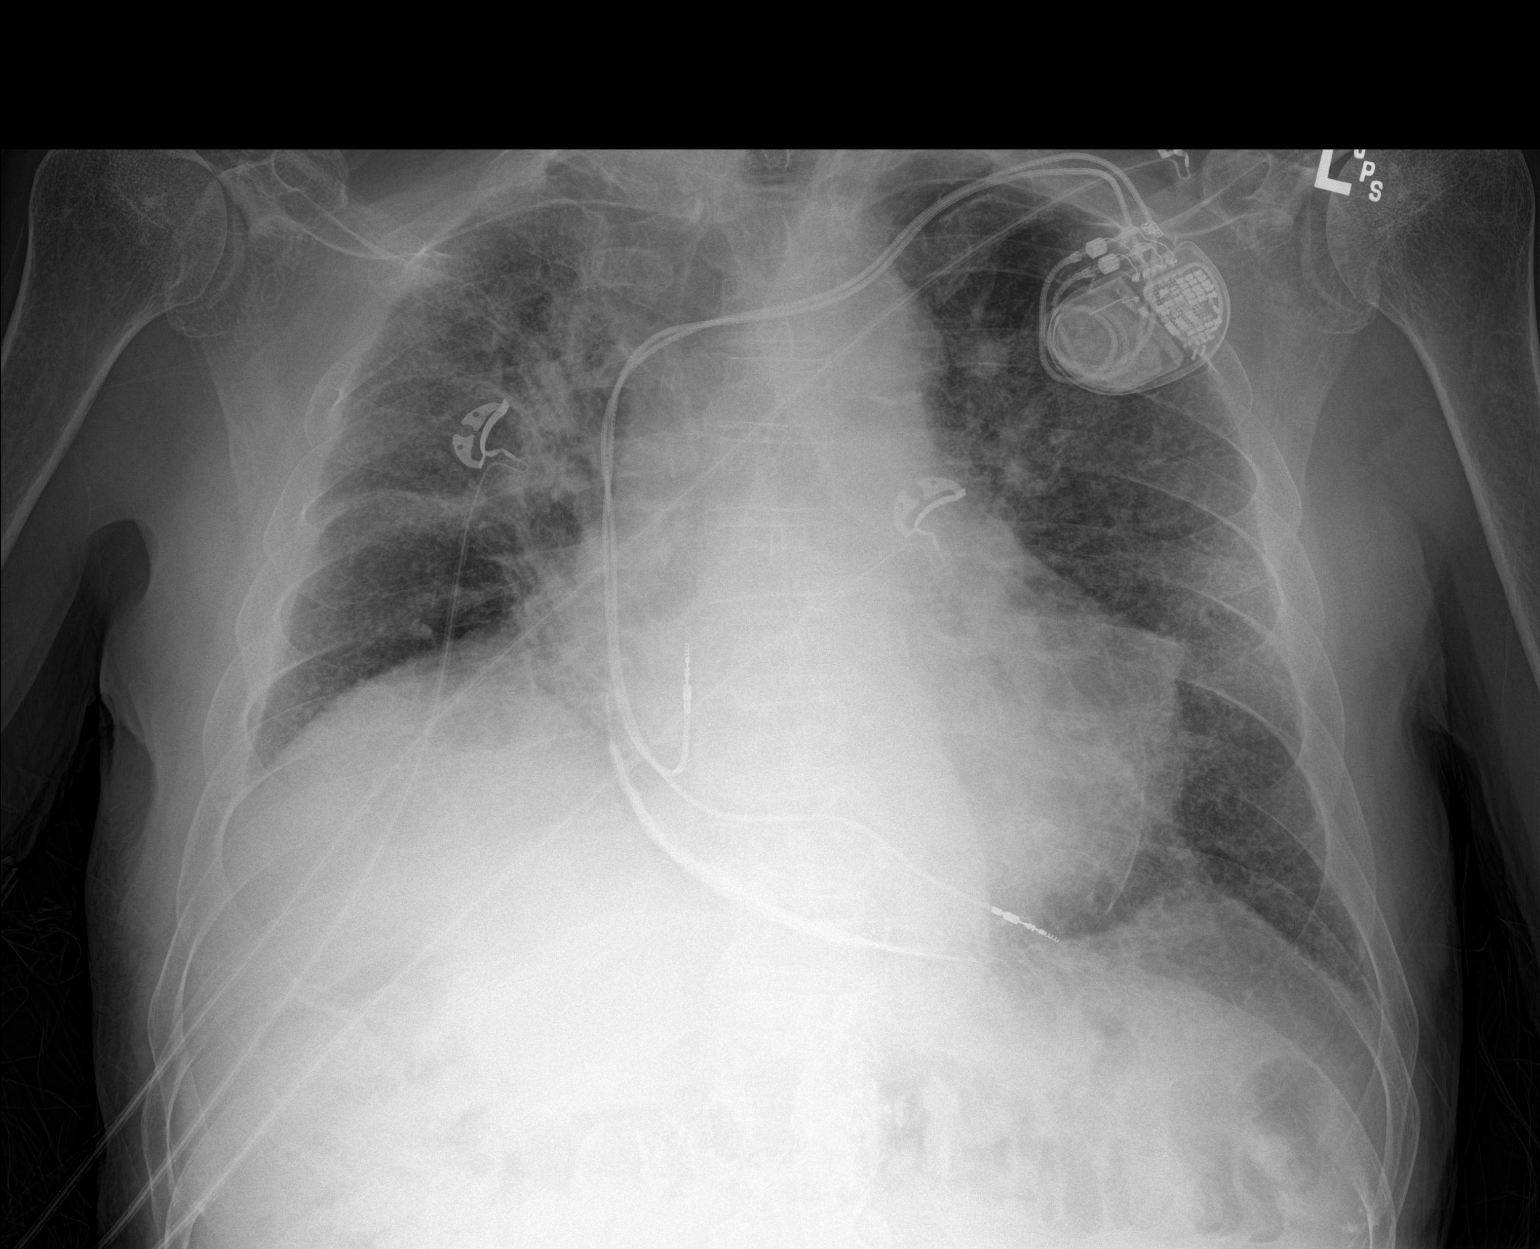

[1 of 1 positions shown; findings below may reference images not displayed]

FINDINGS: Left-sided implanted cardiac device remains in place. Stable
cardiomediastinal contours. Aortic atherosclerosis. Diffuse
interstitial opacities persist, not appreciably changed from prior.
No pleural effusion. No pneumothorax.
IMPRESSION: No significant interval change in diffuse interstitial opacities.

## 2021-04-11 MED ORDER — HYDROCOD POLST-CPM POLST ER 10-8 MG/5ML PO SUER
5.0000 mL | Freq: Two times a day (BID) | ORAL | Status: DC | PRN
  Administered 2021-04-13 – 2021-04-15 (×3): 5 mL via ORAL
  Filled 2021-04-11 (×3): qty 5

## 2021-04-11 NOTE — TOC Initial Note (Signed)
Transition of Care [facility]) - Initial/Assessment Note    Patient Details  Name: Alexander Murray, Dr. MRN: [id number] Date of Birth: 22-Apr-1941  Transition [facility]) CM/SW Contact:    Alexander Chars, LCSW Phone Number: 04/11/2021, 11:26 AM  Clinical Narrative:      CSW met with pt to confirm residency at [facility]. Permission given to speak with wife Alexander Murray present. They live in independent living at [place].  Pt has home O2, unsure which company is providing. [facility]?)  PCP in place.  Current DME: home O2, shower chair, wheelchair.  Waiting on PT recommendations at this time.              Expected Discharge Plan:  ([facility]) Barriers to Discharge: Continued Medical Work up   Patient Goals and CMS Choice Patient states their goals for this hospitalization and ongoing recovery are:: "get back to normal"      Expected Discharge Plan and Services Expected Discharge Plan:  ([facility]) In-house Referral: Clinical Social Work   Post Acute Care Choice:  (TBD) Living arrangements for the past 2 months: [place]                                      Prior Living Arrangements/Services Living arrangements for the past 2 months: [place] Lives with:: Spouse Patient language and need for interpreter reviewed:: Yes Do you feel safe going back to the place where you live?: Yes      Need for Family Participation in Patient Care:  (TBD) Care giver support system in place?: Yes (comment) Current home services: DME (home O2) Criminal Activity/Legal Involvement Pertinent to Current Situation/Hospitalization: No - Comment as needed  Activities of Daily Living Home Assistive Devices/Equipment: Oxygen, Cane (specify quad or straight) ADL Screening (condition at time of admission) Patient's cognitive ability adequate to safely complete daily activities?: Yes Is the patient deaf or have  difficulty hearing?: Yes Does the patient have difficulty seeing, even when wearing glasses/contacts?: Yes Does the patient have difficulty concentrating, remembering, or making decisions?: No Patient able to express need for assistance with ADLs?: Yes Does the patient have difficulty dressing or bathing?: Yes Independently performs ADLs?: Yes (appropriate for developmental age) Does the patient have difficulty walking or climbing stairs?: Yes Weakness of Legs: Both Weakness of Arms/Hands: None  Permission Sought/Granted Permission sought to share information with : Family Supports Permission granted to share information with : Yes, Verbal Permission Granted  Share Information with NAME: wife Alexander Murray  Permission granted to share info w AGENCY: [facility]        Emotional Assessment Appearance:: Appears stated age Attitude/Demeanor/Rapport: Engaged Affect (typically observed): Appropriate, Pleasant Orientation: :  (not recorded) Alcohol / Substance Use: Not Applicable Psych Involvement: No (comment)  Admission diagnosis:  HCAP (healthcare-associated pneumonia) [J18.9] Patient Active Problem List   Diagnosis Date Noted   HCAP (healthcare-associated pneumonia) 04/09/2021   Acute respiratory failure with hypoxia ([place]) 04/09/2021   Bilateral pleural effusion 04/09/2021   Thrombocytopenia ([place]) 04/09/2021   Thoracic ascending aortic aneurysm ([place]) 04/09/2021   Pacemaker - MDT 10/15/2020   Acute on chronic diastolic CHF (congestive heart failure) ([place]) 08/24/2020   Secondary hypercoagulable state ([place]) 08/12/2020   Persistent atrial fibrillation ([place])    LLQ abdominal pain 05/10/2020   Goals of care, counseling/discussion 03/19/2020   Encounter for antineoplastic chemotherapy 03/19/2020   Small cell lung  cancer, right lower lobe ([place]) 03/12/2020   S/P partial lobectomy of lung 03/11/2020   Postnasal drip 10/13/2016   S/P right TKA 05/20/2015   S/P knee replacement 05/20/2015    Central retinal artery occlusion 06/26/2013   PCP:  Alexander Battles, MD Pharmacy:   [facility] - [place] [address] AT [facility] OF [address] & [address] [place] [address] Phone: [phone] Fax: [phone]  [facility] [address] [address] Phone: [phone] Fax: [phone]     Social Determinants of Health (SDOH) Interventions    Readmission Risk Interventions No flowsheet data found.

## 2021-04-11 NOTE — Evaluation (Signed)
Physical Therapy Evaluation Patient Details Name: Alexander Murray, Dr. 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  Clinical Impression  Pt admitted with/for progressive SOB.  Pt needing supervision to light minimal assist for mobility and gait.  Pt currently limited functionally due to the problems listed. ( See problems list.)   Pt will benefit from PT to maximize function and safety in order to get ready for next venue listed below.     Follow Up Recommendations SNF;Other (comment) ([facility])    Equipment Recommendations  None recommended by PT    Recommendations for Other Services       Precautions / Restrictions Precautions Precautions: Fall Precaution Comments: Pt. desats into the mid 80s with minimal activity. Restrictions Weight Bearing Restrictions: No      Mobility  Bed Mobility Overal bed mobility: Needs Assistance Bed Mobility: Supine to Sit;Sit to Supine     Supine to sit: Supervision Sit to supine: Supervision   General bed mobility comments: At EOB on arrival    Transfers Overall transfer level: Needs assistance Equipment used: 1 person hand held assist Transfers: Sit to/from Stand Sit to Stand: Min assist         General transfer comment: Pt. needs cues for proper hand placement. minimal boost assist  Ambulation/Gait Ambulation/Gait assistance: Min assist Gait Distance (Feet): 75 Feet Assistive device: Rolling walker (2 wheeled) Gait Pattern/deviations: Step-through pattern Gait velocity: slower Gait velocity interpretation: <1.8 ft/sec, indicate of  risk for recurrent falls General Gait Details: generally steady with the RW, noticeably weaken gait, DOE 1-2/4, Sats during gait on 3L dropped to 85%, Raising to 4L [place], sats rose to 88/89% during gait.[place] Kitchen HR in the 90's  At 4L sats rose to ~ 95% in less than a min.  Stairs            Wheelchair Mobility    Modified Rankin (Stroke Patients Only)       Balance Overall balance assessment: Mild deficits observed, not formally tested                                           Pertinent Vitals/Pain Pain Assessment: No/denies pain    Home Living Family/patient expects to be discharged to:: Private residence (snf at [place] is also an option) Living Arrangements: Spouse/significant other Available Help at Discharge: Family;Available 24 hours/day Type of Home: Independent living facility Home Access: Level entry     Home Layout: One level Home Equipment: Shower seat - built in;Cane - single point;Grab bars - tub/shower;Grab bars - toilet;Hand held shower head Additional Comments: [facility]    Prior Function Level of Independence: Independent with assistive device(s)               Hand Dominance   Dominant Hand: Left    Extremity/Trunk Assessment   Upper Extremity Assessment Upper Extremity Assessment: Generalized weakness    Lower Extremity Assessment Lower Extremity Assessment: Generalized weakness       Communication   Communication: No difficulties  Cognition Arousal/Alertness: Awake/alert Behavior During Therapy: [facility]  for tasks assessed/performed Overall Cognitive Status: Within Functional Limits for tasks assessed                                        General Comments General comments (skin integrity, edema, etc.): check o2 sats during session.    Exercises     Assessment/Plan    PT Assessment Patient needs continued PT services  PT Problem List Decreased strength;Decreased activity  tolerance;Decreased balance;Decreased mobility;Decreased knowledge of use of DME;Cardiopulmonary status limiting activity       PT Treatment Interventions DME instruction;Gait training;Functional mobility training;Therapeutic activities;Balance training;Patient/family education    PT Goals (Current goals can be found in the Care Plan section)  Acute Rehab PT Goals Patient Stated Goal: to get back to my normal level of function. PT Goal Formulation: With patient Time For Goal Achievement: 04/18/21 Potential to Achieve Goals: Good    Frequency Min 3X/week   Barriers to discharge        Co-evaluation               AM-PAC PT "6 Clicks" Mobility  Outcome Measure Help needed turning from your back to your side while in a flat bed without using bedrails?: A Little Help needed moving from lying on your back to sitting on the side of a flat bed without using bedrails?: A Little Help needed moving to and from a bed to a chair (including a wheelchair)?: A Little Help needed standing up from a chair using your arms (e.g., wheelchair or bedside chair)?: A Little Help needed to walk in hospital room?: A Little Help needed climbing 3-5 steps with a railing? : A Little 6 Click Score: 18    End of Session Equipment Utilized During Treatment: Oxygen Activity Tolerance: Patient tolerated treatment well;Patient limited by fatigue Patient left: in chair;with call bell/phone within reach;with family/visitor present Nurse Communication: Mobility status PT Visit Diagnosis: Unsteadiness on feet (R26.81);Other abnormalities of gait and mobility (R26.89);Difficulty in walking, not elsewhere classified (R26.2)    Time: 8841-6606 PT Time Calculation (min) (ACUTE ONLY): 35 min   Charges:   PT Evaluation $PT Eval Moderate Complexity: 1 Mod PT Treatments $Gait Training: 8-22 mins        04/11/2021  Ginger Carne., PT Acute Rehabilitation Services [phone]  (pager) [phone]   (office)  Tessie Fass Odus Clasby 04/11/2021, 3:23 PM

## 2021-04-11 NOTE — Progress Notes (Signed)
Occupational Therapy Treatment Patient Details Name: Alexander Murray, Dr. 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   OT comments  Pt. Was cooperative with OT evaluation. Pt. O2 sats in supine was the low to mid 90s. Pt. Sat eob and sats dropped to low 80s. Pt. Did rebound to 90s and preformed le adls sitting eob with cross leg technique and his sats dropped to mid to low 80s. Pt. Did rebound after a few minutes to low 80s. Pt. Stood with Min a and side stepped to hob and then sat down and o2 sats were low 80s. Pt. Returned to supine and pt. Rebounded to mid 90s after a few minutes. Acute OT to follow.   Follow Up Recommendations  SNF;Home health OT    Equipment Recommendations  None recommended by OT    Recommendations for Other Services      Precautions / Restrictions Precautions Precautions: Fall Precaution Comments: Pt. desats into the low 80s with minimal activity. Restrictions Weight Bearing Restrictions: No       Mobility Bed Mobility Overal bed mobility: Needs Assistance Bed Mobility: Supine to Sit;Sit to Supine     Supine to sit: Supervision Sit to supine: Supervision   General bed mobility comments: cues for technique    Transfers Overall transfer level: Needs assistance Equipment used: 1 person hand held assist Transfers: Sit to/from Stand Sit to Stand: Min assist         General transfer comment: Pt. needs cues for proper hand placement. Pt. desats into low 80 with minal activity.    Balance Overall balance assessment: Mild deficits observed, not formally tested                                          ADL either performed or assessed with clinical judgement   ADL Overall ADL's : Needs assistance/impaired Eating/Feeding: Set up;Bed level   Grooming: Wash/dry hands;Wash/dry face;Sitting   Upper Body Bathing: Set up;Sitting   Lower Body Bathing: Minimal assistance;Sit to/from stand   Upper Body Dressing : Minimal assistance;Sitting   Lower Body Dressing: Moderate assistance;Sit to/from stand   Toilet Transfer: Minimal assistance;BSC   Toileting- Clothing Manipulation and Hygiene: Moderate assistance;Sit to/from stand       Functional mobility during ADLs: Minimal assistance (Pt. desats into low 80s with minimal activity) General ADL Comments: Pt. desats to low 80s with minimal activity during adls. pt. had to take sitting rest brakes to recoup.     Vision Baseline Vision/History: 1 Wears glasses Ability to See in Adequate Light: 0 Adequate Patient Visual Report: No change from baseline Vision Assessment?: No apparent visual deficits   Perception     Praxis      Cognition Arousal/Alertness: Awake/alert Behavior During Therapy: WFL for tasks assessed/performed Overall Cognitive Status: Within Functional Limits for tasks assessed                                          Exercises  Shoulder Instructions       General Comments check o2 sats during session.    Pertinent Vitals/ Pain       Pain Assessment: No/denies pain  Home Living Family/patient expects to be discharged to:: Private residence ([facility] also an option) Living Arrangements: Spouse/significant other Available Help at Discharge: Family;Available 24 hours/day Type of Home: Independent living facility Home Access: Level entry     Home Layout: One level     Bathroom Shower/Tub: Occupational psychologist: Handicapped height     Home Equipment: Shower seat - built in;Cane - single point;Grab bars -  tub/shower;Grab bars - toilet;Hand held shower head   Additional Comments: [facility]      Prior Functioning/Environment Level of Independence: Independent with assistive device(s)            Frequency  Min 2X/week        Progress Toward Goals  OT Goals(current goals can now be found in the care plan section)     Acute Rehab OT Goals Patient Stated Goal: to be able to breath OT Goal Formulation: With patient Time For Goal Achievement: 04/25/21 Potential to Achieve Goals: Good ADL Goals Pt Will Perform Grooming: with modified independence;standing Pt Will Perform Upper Body Bathing: with modified independence;sitting Pt Will Perform Lower Body Bathing: with modified independence;with adaptive equipment;sit to/from stand Pt Will Perform Upper Body Dressing: with modified independence;sitting Pt Will Perform Lower Body Dressing: with modified independence;with adaptive equipment;sit to/from stand Pt Will Transfer to Toilet: with modified independence;ambulating;grab bars Pt Will Perform Toileting - Clothing Manipulation and hygiene: with modified independence;sit to/from stand  Plan      Co-evaluation                 AM-PAC OT "6 Clicks" Daily Activity     Outcome Measure   Help from another person eating meals?: A Little Help from another person taking care of personal grooming?: A Little Help from another person toileting, which includes using toliet, bedpan, or urinal?: A Little Help from another person bathing (including washing, rinsing, drying)?: A Lot Help from another person to put on and taking off regular upper body clothing?: A Little Help from another person to put on and taking off regular lower body clothing?: A Lot 6 Click Score: 16    End of Session Equipment Utilized During Treatment: Gait belt  OT Visit Diagnosis: Unsteadiness on feet (R26.81);Muscle weakness (generalized) (M62.81)   Activity Tolerance Other (comment)  (limited by o2 sats)   Patient Left in bed;with call bell/phone within reach;with bed alarm set   Nurse Communication  (ok therapy)        Time: 3382-5053 OT Time Calculation (min): 39 min  Charges: OT General Charges $OT Visit: 1 Visit OT Evaluation $OT Eval Moderate Complexity: 1 Mod OT Treatments $Self Care/Home Management : 8-22 mins  Reece Packer OT/L'   Nyelah Emmerich 04/11/2021, 12:31 PM

## 2021-04-11 NOTE — Progress Notes (Signed)
PROGRESS NOTE    Alexander Murray, Dr.  LGX:[id number] DOB: 06/27/1941 DOA: 04/09/2021 PCP: [name], MD   Brief Narrative:  The patient is an 80 year old Caucasian male with a past medical history significant for but not limited to complete heart block with pacemaker placement, atrial fibrillation on Tikosyn and Xarelto, hypertension, small cell lung cancer status post wedge resection status post chemotherapy and radiation who presented to the emergency room for evaluation of progressive worsening shortness of breath.  Patient recently completed radiation in July and is currently under observation with his oncologist but reports worsening shortness of breath and fatigue for over a little month.  He had been started on prednisone and did not notice much improvement but reported significant worsening when he began to decrease the dose.  He has gone back to 40 mg of prednisone daily but continued to be more dyspneic and has a cough with scant sputum production which is often pink-tinged.  He has had no fevers or chills, chest pain or leg swelling.  He was previously prescribed torsemide but reports that he not been taking it regularly since his legs were not swollen that because his weight has been fairly stable.  He did note a 2 pound weight gain but no swelling.  He was seen at an outside emergency department on 04/05/2021 for worsening dyspnea and was diagnosed with bilateral pneumonia and was started on doxycycline and supplemental oxygen.  He continues to worsen despite this and presented to [facility] and found to be saturating in the mid 90s on 3 L of supplemental oxygen and is mildly tachypneic.  BNP was elevated 336 and CT of the chest was done which was negative for PE but was notable for groundglass opacities and interlobular septal thickening on background of fibrotic changes with increased bilateral pleural effusions concerning for pulmonary edema with reflux of contrast into hepatic  veins and possible infection.  Blood cultures obtained and he was admitted and treated with IV Lasix, cefepime and vancomycin and transferred to [facility] for further evaluation management.  After further discussion with the patient patient actually has supplemental oxygen at home and only wears 2 L as needed.  He has been admitted for acute on chronic hypoxic respiratory failure in setting of acute on chronic heart failure with preserved ejection fraction with bilateral pleural effusions and a history of swelling so wound cancer as well as concomitant pneumonia present on admission.  He underwent a thoracentesis yesterday and this morning he did not feel good given that he had a rough night.  PT OT to further evaluate and treat and may need SNF.  Assessment & Plan:   Principal Problem:   Acute respiratory failure with hypoxia ([name]) Active Problems:   Small cell lung cancer, right lower lobe ([name])   Persistent atrial fibrillation ([name])   Acute on chronic diastolic CHF (congestive heart failure) ([name])   Bilateral pleural effusion   Thrombocytopenia ([name])   Thoracic ascending aortic aneurysm ([name])   Acute on chronic hypoxic respiratory failure in the setting of acute on chronic heart failure with preserved ejection fraction with associated bilateral pleural effusions and concomitant pneumonia in a patient with a history of SCLC and asthma -Patient has a history of heart failure with preserved ejection fraction at [facility] status postresection, chemotherapy and radiation and presented with progressive shortness of breath after being diagnosed with pneumonia on 04/05/2021 despite being started on supplemental oxygen and doxycycline -CT of the chest was done and showed a  large right and moderate left pleural effusions with GGO and septal thickening of the background fibrosis -BNP was elevated on admission at 338 -He is no chills or fevers or leukocytosis -Blood cultures were obtained in the ED and he was  treated with IV Lasix, vancomycin and cefepime -IR has been consulted and patient underwent a thoracentesis today with fluid analysis including cytology -Continue 20 mg IV Lasix every 12 and continue prednisone and antibiotics with IV Rocephin and IV doxycycline for now -Strict I's and O's and daily weights -Patient is - 5.849 L since admission -Procalcitonin level is less than 0.10 x2 however then trended up to 1.41 we will need to continue monitor carefully -We will continue supplemental oxygen via nasal cannula -SpO2: 95 % O2 Flow Rate (L/min): 2.5 L/min -Continue with benzonatate 200 mg p.o. 3 times daily as needed for cough as well as albuterol 3 mL inhalations every 6 as needed for wheezing or shortness of breath; will add Tussionex given his significant coughing today -Continuous pulse oximetry maintain O2 saturation greater 90% -Continue supplemental oxygen via nasal cannula and wean O2 as tolerated -We will need an ambulatory home O2 screen prior to discharge and repeat chest x-ray in a.m. -If not improving may need to involve pulmonary and will notify his oncologist as a courtesy  Paroxysmal atrial fibrillation -Continue with Tikosyn and Xarelto as well as metoprolol succinate 12.5 mg p.o. daily -Continue telemetry monitoring -He has been in sinus rhythm since admission  Thrombocytopenia -Appears stable with no active bleeding -Patient's platelet count went from 110 -> 107 -> 105 -> 122 -Continue to monitor for signs and symptoms bleeding repeat CBC in a.m.  Normocytic anemia -Hemoglobin/markers appear stable at 11.0/34.2 but did drop slightly to 10.3/31.9 -Check anemia panel in a.m. -Likely anemia of chronic disease -Continue monitor for signs and symptoms of bleeding; currently no overt bleeding noted -Repeat CBC in a.m.  Hyperbilirubinemia -Mild and likely reactive -Patient's T bili went from 2.8 and trended down to 2.0 but bumped up to 2.3 -Continue to monitor and  trend and repeat CMP in a.m.  Hyponatremia -Likely in the setting of volume overload -Patient's sodium went from 132 and trended up to 134 -> 132 -Continue with diuresis as above -Continue monitor and trend and repeat CMP in a.m.  GERD/GI prophylaxis -Continue with esomeprazole substitution with pantoprazole 40 mg p.o. daily  Hyperlipidemia -Continue with atorvastatin 20 once p.o. daily  BPH -Continue with doxazosin 4 mg p.o. daily  Goals of care; DNR, present on admission  DVT prophylaxis: Anticoagulated with Xarelto Code Status: DO NOT RESUSCITATE Family Communication: No family present at bedside Disposition Plan: Pending further clinical improvement and weaning of supplemental oxygen we will repeat chest x-ray in a.m.; Will need PT/OT to further evaluate and treat  Status is: Inpatient  Remains inpatient appropriate because:Unsafe d/c plan, IV treatments appropriate due to intensity of illness or inability to take PO, and Inpatient level of care appropriate due to severity of illness  Dispo: The patient is from: Home              Anticipated d/c is to: Home              Patient currently is not medically stable to d/c.   Difficult to place patient No  Consultants:  IR  Procedures:  Thoracentesis  Successful US guided right thoracentesis. Yielded 650 ml of amber-colored fluid. Pt tolerated procedure well. No immediate complications.  Antimicrobials:  Anti-infectives (From admission, onward)  Start     Dose/Rate Route Frequency Ordered Stop   04/10/21 0900  vancomycin (VANCOREADY) IVPB 1500 mg/300 mL  Status:  Discontinued        1,500 mg 150 mL/hr over 120 Minutes Intravenous Every 24 hours 04/09/21 1739 04/09/21 2122   04/10/21 0600  vancomycin (VANCOREADY) IVPB 1500 mg/300 mL  Status:  Discontinued        1,500 mg 150 mL/hr over 120 Minutes Intravenous Every 24 hours 04/09/21 1402 04/09/21 1739   04/09/21 2300  cefTRIAXone (ROCEPHIN) 2 g in sodium chloride  0.9 % 100 mL IVPB        2 g 200 mL/hr over 30 Minutes Intravenous Every 24 hours 04/09/21 2122 04/14/21 2159   04/09/21 2300  doxycycline (VIBRAMYCIN) 100 mg in sodium chloride 0.9 % 250 mL IVPB        100 mg 125 mL/hr over 120 Minutes Intravenous Every 12 hours 04/09/21 2154     04/09/21 2215  azithromycin (ZITHROMAX) 500 mg in sodium chloride 0.9 % 250 mL IVPB  Status:  Discontinued        500 mg 250 mL/hr over 60 Minutes Intravenous Every 24 hours 04/09/21 2122 04/09/21 2154   04/09/21 1415  vancomycin (VANCOCIN) IVPB 1000 mg/200 mL premix        1,000 mg 200 mL/hr over 60 Minutes Intravenous  Once 04/09/21 1402 04/09/21 1718   04/09/21 1400  ceFEPIme (MAXIPIME) 2 g in sodium chloride 0.9 % 100 mL IVPB        2 g 200 mL/hr over 30 Minutes Intravenous  Once 04/09/21 1347 04/09/21 1509        Subjective: Seen and examined at bedside and states that he had a very rough night and states that his oxygen came off in the middle of the night and could not find nursing to put it back on for a while.  States that after his oxygen was replaced he went to sleep.  Has been coughing significantly today.  Did not feel as well today and still awaiting physical therapy evaluation.  Objective: Vitals:   04/10/21 1447 04/10/21 2027 04/11/21 0327 04/11/21 1000  BP: 108/71 117/74 107/71 103/69  Pulse: 81 90 71 76  Resp: 17 18 18 20   Temp: 98.9 F (37.2 C) 97.7 F (36.5 C) 97.7 F (36.5 C)   TempSrc: Oral Oral Oral   SpO2: 95% 90% 96% 95%  Weight:   68.9 kg   Height:        Intake/Output Summary (Last 24 hours) at 04/11/2021 1453 Last data filed at 04/11/2021 1050 Gross per 24 hour  Intake 500 ml  Output 3350 ml  Net -2850 ml    Filed Weights   04/09/21 1035 04/11/21 0327  Weight: 68.9 kg 68.9 kg   Examination: Physical Exam:  Constitutional: The patient is a thin elderly chronically ill-appearing Caucasian male currently in mild respiratory distress coughing significantly Eyes: Lids  and conjunctivae normal, sclerae anicteric  ENMT: External Ears, Nose appear normal. Grossly normal hearing. Mucous membranes are moist.  Neck: Appears normal, supple, no cervical masses, normal ROM, no appreciable thyromegaly; No JVD Respiratory: Diminished to auscultation bilaterally with some coarse breath sounds and some slight crackles., no wheezing, rales, rhonchi or crackles. Normal respiratory effort and patient is not tachypenic. No accessory muscle use.  Wearing supplemental oxygen via nasal cannula but has unlabored breathing and is coughing significantly Cardiovascular: RRR, no murmurs / rubs / gallops. S1 and S2 auscultated.  Minimal extremity  edema Abdomen: Soft, non-tender, non-distended. Bowel sounds positive.  GU: Deferred. Musculoskeletal: No clubbing / cyanosis of digits/nails. No joint deformity upper and lower extremities.  Skin: No rashes, lesions, ulcers on limited skin evaluation. No induration; Warm and dry.  Neurologic: CN 2-12 grossly intact with no focal deficits. Romberg sign and cerebellar reflexes not assessed.  Psychiatric: Normal judgment and insight. Alert and oriented x 3. Normal mood and appropriate affect.    Data Reviewed: I have personally reviewed following labs and imaging studies  CBC: Recent Labs  Lab 04/09/21 1047 04/10/21 0337 04/11/21 0139  WBC 7.3 7.9 6.8  NEUTROABS 6.6  --  5.7  HGB 10.9* 11.0* 10.3*  HCT 33.6* 34.2* 31.9*  MCV 85.5 85.9 85.5  PLT 107* 105* 122*    Basic Metabolic Panel: Recent Labs  Lab 04/09/21 1047 04/10/21 0337 04/11/21 0139  NA 132* 134* 132*  K 3.7 3.8 3.8  CL 100 100 99  CO2 24 27 26   GLUCOSE 164* 80 100*  BUN 21 14 18   CREATININE 0.74 0.77 0.82  CALCIUM 9.8 9.8 9.7  MG  --  1.9 1.9  PHOS  --   --  2.8    GFR: Estimated Creatinine Clearance: 67.7 mL/min (by C-G formula based on SCr of 0.82 mg/dL). Liver Function Tests: Recent Labs  Lab 04/09/21 1047 04/10/21 0337 04/11/21 0139  AST 22 21 17    ALT 26 27 23   ALKPHOS 369* 344* 325*  BILITOT 2.8* 2.0* 2.3*  PROT 6.4* 6.1* 5.5*  ALBUMIN 3.7 3.1* 2.7*    No results for input(s): LIPASE, AMYLASE in the last 168 hours. No results for input(s): AMMONIA in the last 168 hours. Coagulation Profile: No results for input(s): INR, PROTIME in the last 168 hours. Cardiac Enzymes: No results for input(s): CKTOTAL, CKMB, CKMBINDEX, TROPONINI in the last 168 hours. BNP (last 3 results) Recent Labs    10/03/20 1340  PROBNP 471    HbA1C: No results for input(s): HGBA1C in the last 72 hours. CBG: No results for input(s): GLUCAP in the last 168 hours. Lipid Profile: No results for input(s): CHOL, HDL, LDLCALC, TRIG, CHOLHDL, LDLDIRECT in the last 72 hours. Thyroid Function Tests: No results for input(s): TSH, T4TOTAL, FREET4, T3FREE, THYROIDAB in the last 72 hours. Anemia Panel: No results for input(s): VITAMINB12, FOLATE, FERRITIN, TIBC, IRON, RETICCTPCT in the last 72 hours. Sepsis Labs: Recent Labs  Lab 04/09/21 2209 04/10/21 0337 04/11/21 0139  PROCALCITON <0.10 <0.10 1.41     Recent Results (from the past 240 hour(s))  Resp Panel by RT-PCR (Flu A&B, Covid) Nasopharyngeal Swab     Status: None   Collection Time: 04/09/21 11:47 AM   Specimen: Nasopharyngeal Swab; Nasopharyngeal(NP) swabs in vial transport medium  Result Value Ref Range Status   SARS Coronavirus 2 by RT PCR NEGATIVE NEGATIVE Final    Comment: (NOTE) SARS-CoV-2 target nucleic acids are NOT DETECTED.  The SARS-CoV-2 RNA is generally detectable in upper respiratory specimens during the acute phase of infection. The lowest concentration of SARS-CoV-2 viral copies this assay can detect is 138 copies/mL. A negative result does not preclude SARS-Cov-2 infection and should not be used as the sole basis for treatment or other patient management decisions. A negative result may occur with  improper specimen collection/handling, submission of specimen other than  nasopharyngeal swab, presence of viral mutation(s) within the areas targeted by this assay, and inadequate number of viral copies(<138 copies/mL). A negative result must be combined with clinical observations, patient history,  and epidemiological information. The expected result is Negative.  Fact Sheet for Patients:  EntrepreneurPulse.com.au  Fact Sheet for Healthcare Providers:  IncredibleEmployment.be  This test is no t yet approved or cleared by the [place] FDA and  has been authorized for detection and/or diagnosis of SARS-CoV-2 by FDA under an Emergency Use Authorization (EUA). This EUA will remain  in effect (meaning this test can be used) for the duration of the COVID-19 declaration under Section 564(b)(1) of the Act, 21 U.S.C.section 360bbb-3(b)(1), unless the authorization is terminated  or revoked sooner.       Influenza A by PCR NEGATIVE NEGATIVE Final   Influenza B by PCR NEGATIVE NEGATIVE Final    Comment: (NOTE) The Xpert Xpress SARS-CoV-2/FLU/RSV plus assay is intended as an aid in the diagnosis of influenza from Nasopharyngeal swab specimens and should not be used as a sole basis for treatment. Nasal washings and aspirates are unacceptable for Xpert Xpress SARS-CoV-2/FLU/RSV testing.  Fact Sheet for Patients: EntrepreneurPulse.com.au  Fact Sheet for Healthcare Providers: IncredibleEmployment.be  This test is not yet approved or cleared by the [place] FDA and has been authorized for detection and/or diagnosis of SARS-CoV-2 by FDA under an Emergency Use Authorization (EUA). This EUA will remain in effect (meaning this test can be used) for the duration of the COVID-19 declaration under Section 564(b)(1) of the Act, 21 U.S.C. section 360bbb-3(b)(1), unless the authorization is terminated or revoked.  Performed at [facility], [address], [place], [place] [address]   Culture, blood (Routine X 2) w Reflex to ID Panel     Status: None (Preliminary result)   Collection Time: 04/09/21  2:25 PM   Specimen: BLOOD  Result Value Ref Range Status   Specimen Description BLOOD LEFT ANTECUBITAL  Final   Special Requests   Final    BOTTLES DRAWN AEROBIC AND ANAEROBIC Blood Culture adequate volume   Culture   Final    NO GROWTH 2 DAYS Performed at [facility], [place] [address] [place], [place] [address]    Report Status PENDING  Incomplete  Culture, blood (Routine X 2) w Reflex to ID Panel     Status: None (Preliminary result)   Collection Time: 04/09/21  2:25 PM   Specimen: BLOOD  Result Value Ref Range Status   Specimen Description BLOOD RIGHT ANTECUBITAL  Final   Special Requests   Final    BOTTLES DRAWN AEROBIC AND ANAEROBIC Blood Culture adequate volume   Culture   Final    NO GROWTH 2 DAYS Performed at [facility], [place] [address] [place], [place] [address]    Report Status PENDING  Incomplete  Gram stain     Status: None   Collection Time: 04/10/21 11:04 AM   Specimen: Lung, Right; Pleural Fluid  Result Value Ref Range Status   Specimen Description FLUID RIGHT PLEURAL  Final   Special Requests NONE  Final   Gram Stain   Final    MODERATE WBC PRESENT,BOTH PMN AND MONONUCLEAR NO ORGANISMS SEEN Performed at [facility], [address] [place], [place] [address]    Report Status 04/10/2021 FINAL  Final  Culture, body fluid w Gram Stain-bottle     Status: None (Preliminary result)   Collection Time: 04/10/21 11:04 AM   Specimen: Fluid  Result Value Ref Range Status   Specimen Description FLUID RIGHT PLEURAL  Final   Special Requests BOTTLES DRAWN AEROBIC AND ANAEROBIC 10CC  Final   Culture  Final    NO GROWTH < 24 HOURS Performed at [facility], [place] [address] [place], [place] [address]    Report Status PENDING  Incomplete     RN Pressure Injury Documentation:     Estimated  body mass index is 22.43 kg/m as calculated from the following:   Height as of this encounter: 5\' 9"  (1.753 m).   Weight as of this encounter: 68.9 kg.  Malnutrition Type:   Malnutrition Characteristics:   Nutrition Interventions:     Radiology Studies: DG Chest 1 View  Result Date: 04/10/2021 CLINICAL DATA:  Status right post thoracentesis EXAM: CHEST  1 VIEW COMPARISON:  Multiple priors FINDINGS: There is a 2 lead implantable cardiac device with leads terminating in the right atrium and right ventricle. Again seen are pericardial calcifications. Improved effusion on the right side. No pneumothorax. There remains bilateral interstitial and pulmonary opacities as seen on recent cross-sectional CT of the chest. No acute osseous abnormality. IMPRESSION: Improved right-sided effusion after right-sided thoracentesis. No pneumothorax. Similar appearance of bilateral pulmonary opacities. Electronically Signed   By: Albin Felling M.D.   On: 04/10/2021 11:25   DG CHEST PORT 1 VIEW  Result Date: 04/11/2021 CLINICAL DATA:  Shortness of breath EXAM: PORTABLE CHEST 1 VIEW COMPARISON:  04/10/2021 FINDINGS: Left-sided implanted cardiac device remains in place. Stable cardiomediastinal contours. Aortic atherosclerosis. Diffuse interstitial opacities persist, not appreciably changed from prior. No pleural effusion. No pneumothorax. IMPRESSION: No significant interval change in diffuse interstitial opacities. Electronically Signed   By: Davina Poke D.O.   On: 04/11/2021 09:29   IR THORACENTESIS ASP PLEURAL SPACE W/IMG GUIDE  Result Date: 04/10/2021 INDICATION: Patient with a history of acute on chronic heart failure and small cell lung cancer presents today with bilateral pleural effusion. Interventional Radiology asked to perform a therapeutic and diagnostic thoracentesis. EXAM: ULTRASOUND GUIDED THORACENTESIS MEDICATIONS: 1% lidocaine 10 mL COMPLICATIONS: None immediate. PROCEDURE: An ultrasound guided  thoracentesis was thoroughly discussed with the patient and questions answered. The benefits, risks, alternatives and complications were also discussed. The patient understands and wishes to proceed with the procedure. Written consent was obtained. Ultrasound was performed to localize and mark an adequate pocket of fluid in the right chest. The area was then prepped and draped in the normal sterile fashion. 1% Lidocaine was used for local anesthesia. Under ultrasound guidance a 6 Fr Safe-T-Centesis catheter was introduced. Thoracentesis was performed. The catheter was removed and a dressing applied. FINDINGS: A total of approximately 650 mL of amber-colored fluid was removed. Samples were sent to the laboratory as requested by the clinical team. IMPRESSION: Successful ultrasound guided right thoracentesis yielding 650 mL of pleural fluid. Read by: Soyla Dryer, NP Electronically Signed   By: Ruthann Cancer M.D.   On: 04/10/2021 11:32    Scheduled Meds:  atorvastatin  20 mg Oral Daily   dofetilide  250 mcg Oral BID   doxazosin  4 mg Oral Daily   furosemide  20 mg Intravenous Q12H   memantine  10 mg Oral BID   metoprolol succinate  12.5 mg Oral QHS   montelukast  10 mg Oral [id number]   pantoprazole  40 mg Oral Daily   potassium chloride  40 mEq Oral Daily   rivaroxaban  20 mg Oral Q supper   Continuous Infusions:  cefTRIAXone (ROCEPHIN)  IV 2 g (04/09/21 2245)   doxycycline (VIBRAMYCIN) IV 100 mg (04/11/21 1050)    LOS: 2 days   Kerney Elbe, DO [facility] PAGER is on AMION  If 7PM-7AM, please contact night-coverage [email or web address]

## 2021-04-12 ENCOUNTER — Inpatient Hospital Stay (HOSPITAL_COMMUNITY): Payer: Medicare Other

## 2021-04-12 LAB — CBC WITH DIFFERENTIAL/PLATELET
Abs Immature Granulocytes: 0.02 10*3/uL (ref 0.00–0.07)
Basophils Absolute: 0 10*3/uL (ref 0.0–0.1)
Basophils Relative: 0 %
Eosinophils Absolute: 0.2 10*3/uL (ref 0.0–0.5)
Eosinophils Relative: 3 %
HCT: 32.6 % — ABNORMAL LOW (ref 39.0–52.0)
Hemoglobin: 10.6 g/dL — ABNORMAL LOW (ref 13.0–17.0)
Immature Granulocytes: 0 %
Lymphocytes Relative: 4 %
Lymphs Abs: 0.2 10*3/uL — ABNORMAL LOW (ref 0.7–4.0)
MCH: 27.7 pg (ref 26.0–34.0)
MCHC: 32.5 g/dL (ref 30.0–36.0)
MCV: 85.3 fL (ref 80.0–100.0)
Monocytes Absolute: 0.7 10*3/uL (ref 0.1–1.0)
Monocytes Relative: 11 %
Neutro Abs: 5.4 10*3/uL (ref 1.7–7.7)
Neutrophils Relative %: 82 %
Platelets: 107 10*3/uL — ABNORMAL LOW (ref 150–400)
RBC: 3.82 MIL/uL — ABNORMAL LOW (ref 4.22–5.81)
RDW: 17.9 % — ABNORMAL HIGH (ref 11.5–15.5)
WBC: 6.5 10*3/uL (ref 4.0–10.5)
nRBC: 0 % (ref 0.0–0.2)

## 2021-04-12 LAB — MAGNESIUM: Magnesium: 1.8 mg/dL (ref 1.7–2.4)

## 2021-04-12 LAB — COMPREHENSIVE METABOLIC PANEL
ALT: 26 U/L (ref 0–44)
AST: 29 U/L (ref 15–41)
Albumin: 2.6 g/dL — ABNORMAL LOW (ref 3.5–5.0)
Alkaline Phosphatase: 373 U/L — ABNORMAL HIGH (ref 38–126)
Anion gap: 7 (ref 5–15)
BUN: 18 mg/dL (ref 8–23)
CO2: 25 mmol/L (ref 22–32)
Calcium: 9.4 mg/dL (ref 8.9–10.3)
Chloride: 100 mmol/L (ref 98–111)
Creatinine, Ser: 0.76 mg/dL (ref 0.61–1.24)
GFR, Estimated: >60 mL/min (ref >60)
Glucose, Bld: 94 mg/dL (ref 70–99)
Potassium: 4 mmol/L (ref 3.5–5.1)
Sodium: 132 mmol/L — ABNORMAL LOW (ref 135–145)
Total Bilirubin: 2.1 mg/dL — ABNORMAL HIGH (ref 0.3–1.2)
Total Protein: 5.3 g/dL — ABNORMAL LOW (ref 6.5–8.1)

## 2021-04-12 LAB — BRAIN NATRIURETIC PEPTIDE: B Natriuretic Peptide: 131.5 pg/mL — ABNORMAL HIGH (ref 0.0–100.0)

## 2021-04-12 LAB — PHOSPHORUS: Phosphorus: 2.7 mg/dL (ref 2.5–4.6)

## 2021-04-12 LAB — PROCALCITONIN: Procalcitonin: <0.1 ng/mL

## 2021-04-12 IMAGING — DX DG CHEST 1V PORT
1 series · 1 of 1 positions shown · non-contrast
Comparison: [DATE].  Chest CTA dated [DATE].

CLINICAL DATA: Shortness of breath.

EXAM:
PORTABLE CHEST 1 VIEW

[chest ap]
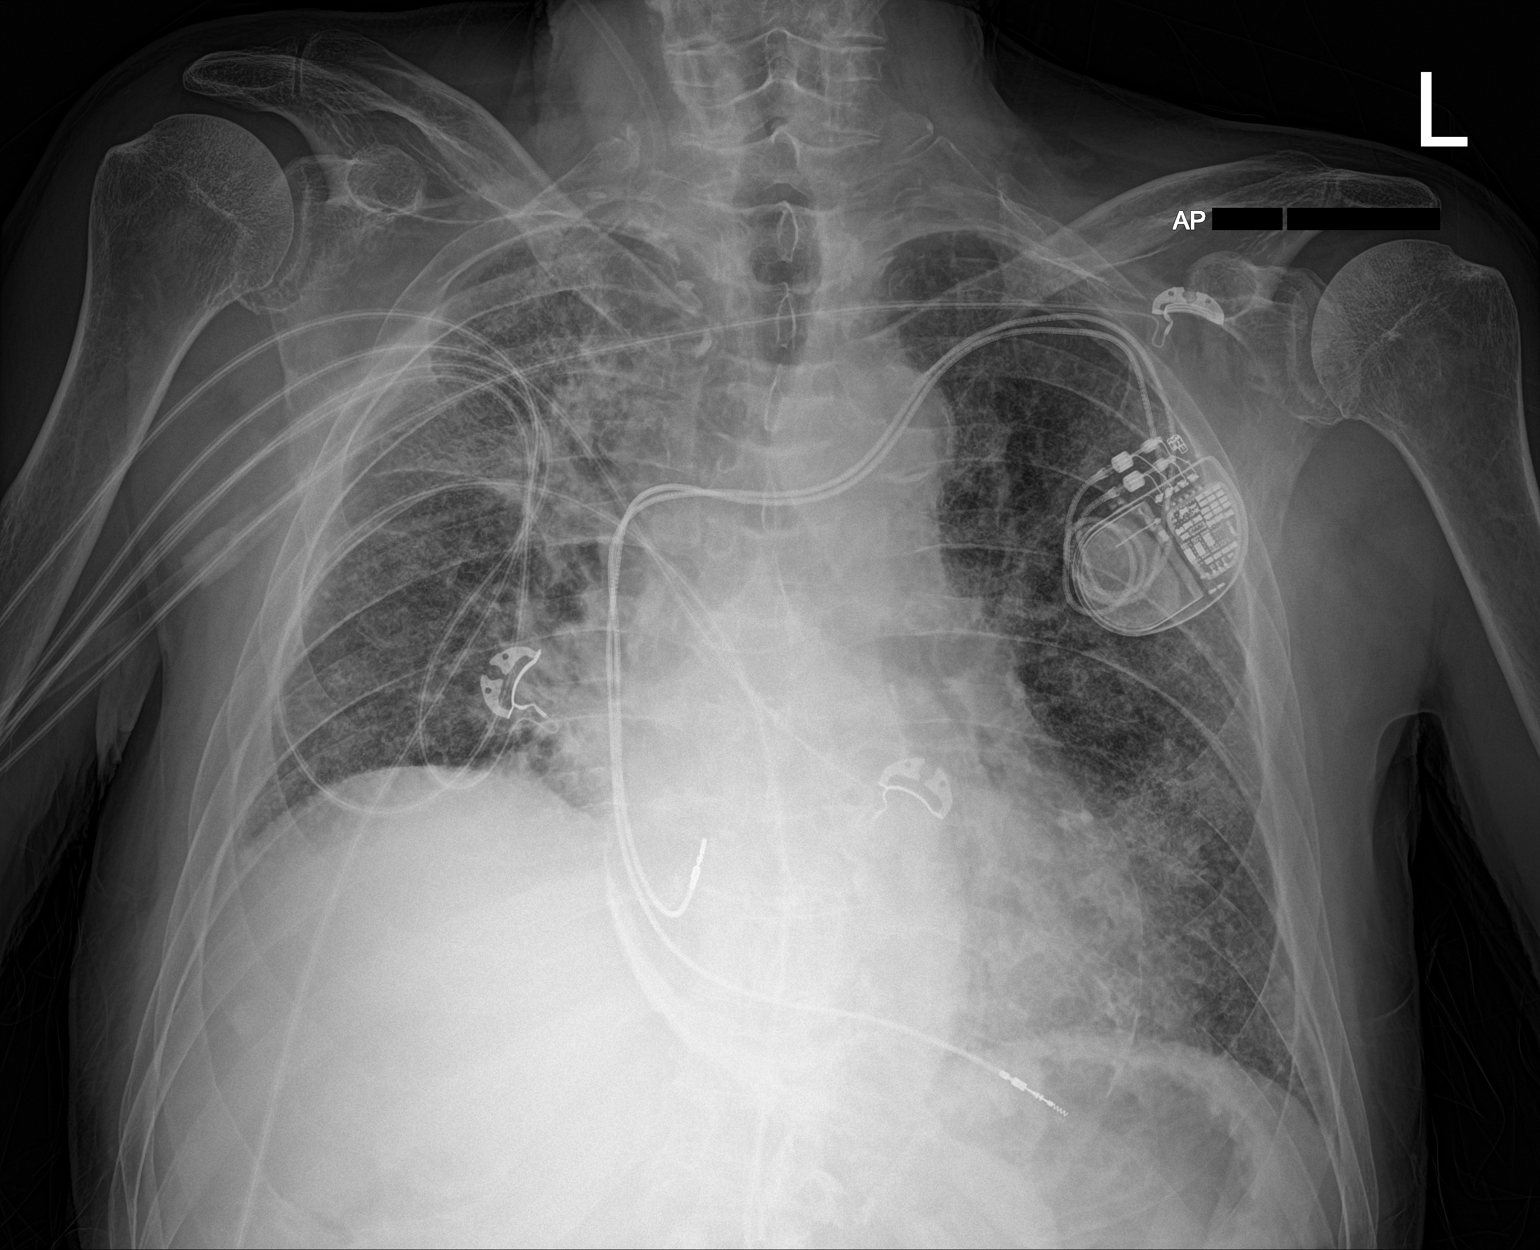

[1 of 1 positions shown; findings below may reference images not displayed]

FINDINGS: Stable mildly enlarged cardiac silhouette and tortuous and calcified
thoracic aorta. Stable left subclavian bipolar pacemaker leads.
Stable dense pericardial calcifications. Stable diffuse prominence
of the interstitial markings. Interval mild patchy opacity in the
left lower lung zone and right upper lobe with associated mild right
upper lobe volume loss. Diffuse osteopenia.
IMPRESSION: 1. Interval patchy atelectasis or pneumonia in the left lower lung
zone.
2. Interval mild right upper lobe atelectasis.
3. Stable chronic interstitial lung disease.
4. Stable changes of calcific pericarditis.

## 2021-04-12 MED ORDER — PREDNISONE 20 MG PO TABS
40.0000 mg | ORAL_TABLET | Freq: Every day | ORAL | Status: DC
  Administered 2021-04-13: 40 mg via ORAL
  Filled 2021-04-12 (×2): qty 2

## 2021-04-12 NOTE — Progress Notes (Signed)
PROGRESS NOTE    Alexander Murray, Dr.  LFY:[id number] DOB: 1941-07-31 DOA: 04/09/2021 PCP: [name], MD   Brief Narrative:  The patient is an 80 year old Caucasian male with a past medical history significant for but not limited to complete heart block with pacemaker placement, atrial fibrillation on Tikosyn and Xarelto, hypertension, small cell lung cancer status post wedge resection status post chemotherapy and radiation who presented to the emergency room for evaluation of progressive worsening shortness of breath.  Patient recently completed radiation in July and is currently under observation with his oncologist but reports worsening shortness of breath and fatigue for over a little month.  He had been started on prednisone and did not notice much improvement but reported significant worsening when he began to decrease the dose.  He has gone back to 40 mg of prednisone daily but continued to be more dyspneic and has a cough with scant sputum production which is often pink-tinged.  He has had no fevers or chills, chest pain or leg swelling.  He was previously prescribed torsemide but reports that he not been taking it regularly since his legs were not swollen that because his weight has been fairly stable.  He did note a 2 pound weight gain but no swelling.  He was seen at an outside emergency department on 04/05/2021 for worsening dyspnea and was diagnosed with bilateral pneumonia and was started on doxycycline and supplemental oxygen.  He continues to worsen despite this and presented to [facility] and found to be saturating in the mid 90s on 3 L of supplemental oxygen and is mildly tachypneic.  BNP was elevated 336 and CT of the chest was done which was negative for PE but was notable for groundglass opacities and interlobular septal thickening on background of fibrotic changes with increased bilateral pleural effusions concerning for pulmonary edema with reflux of contrast into hepatic  veins and possible infection.  Blood cultures obtained and he was admitted and treated with IV Lasix, cefepime and vancomycin and transferred to [facility] for further evaluation management.  After further discussion with the patient patient actually has supplemental oxygen at home and only wears 2 L as needed.  He has been admitted for acute on chronic hypoxic respiratory failure in setting of acute on chronic heart failure with preserved ejection fraction with bilateral pleural effusions and a history of swelling so wound cancer as well as concomitant pneumonia present on admission.  He underwent a thoracentesis during hospitalization. Yesterday morning he did not feel good given that he had a rough night.  PT OT to further evaluate and treat and recommending SNF. Will check an ECHOCardiogram given that he continues to cough and feel a little SOB remaining on O2.   Assessment & Plan:   Principal Problem:   Acute respiratory failure with hypoxia ([name]) Active Problems:   Small cell lung cancer, right lower lobe ([name])   Persistent atrial fibrillation ([name])   Acute on chronic diastolic CHF (congestive heart failure) ([name])   Bilateral pleural effusion   Thrombocytopenia ([name])   Thoracic ascending aortic aneurysm ([name])   Acute on chronic hypoxic respiratory failure in the setting of acute on chronic heart failure with preserved ejection fraction with associated bilateral pleural effusions and concomitant pneumonia in a patient with a history of SCLC and asthma -Patient has a history of heart failure with preserved ejection fraction at [facility] status postresection, chemotherapy and radiation and presented with progressive shortness of breath after being diagnosed with pneumonia on  04/05/2021 despite being started on supplemental oxygen and doxycycline -CT of the chest was done and showed a large right and moderate left pleural effusions with GGO and septal thickening of the background fibrosis -BNP was  elevated on admission at 338 -Repeat BNP and repeat ECHOCardiogram  -He is no chills or fevers or leukocytosis -Blood cultures were obtained in the ED and he was treated with IV Lasix, vancomycin and cefepime -IR has been consulted and patient underwent a thoracentesis today with fluid analysis including cytology -Continue 20 mg IV Lasix every 12 and continue Antibiotics with IV Rocephin and IV doxycycline for now; Prednisone was not resumed until today and will resume at 40 mg Daily  -Strict I's and O's and daily weights -Patient is - 5.849 L since admission -Procalcitonin level is less than 0.10 x2 however then trended up to 1.41 but improved to <0.10 -We will continue supplemental oxygen via nasal cannula -SpO2: 95 % O2 Flow Rate (L/min): 2.5 L/min -Continue with benzonatate 200 mg p.o. 3 times daily as needed for cough as well as albuterol 3 mL inhalations every 6 as needed for wheezing or shortness of breath; will add Tussionex given his significant coughing today -Continuous pulse oximetry maintain O2 saturation greater 90% -Continue supplemental oxygen via nasal cannula and wean O2 as tolerated -Repeat CXR this AM showed "Interval patchy atelectasis or  pneumonia in the left lower lung zone. Interval mild right upper lobe atelectasis. Stable chronic interstitial lung disease. Stable changes of calcific pericarditis."  -We will need an ambulatory home O2 screen prior to discharge and repeat chest x-ray in a.m. -If not improving may need to involve pulmonary and will notify his oncologist as a courtesy  Paroxysmal atrial fibrillation -Continue with Tikosyn and Xarelto as well as metoprolol succinate 12.5 mg p.o. daily -Continue telemetry monitoring -He has been in sinus rhythm since admission  Thrombocytopenia -Appears stable with no active bleeding -Patient's platelet count went from 110 -> 107 -> 105 -> 122 -> 107 -Continue to monitor for signs and symptoms bleeding repeat CBC in  a.m.  Normocytic anemia -Hemoglobin/markers appear stable at 11.0/34.2 but did drop slightly to 10.3/31.9 and is now 10.6/32.6 -Check anemia panel in a.m. -Likely anemia of chronic disease -Continue monitor for signs and symptoms of bleeding; currently no overt bleeding noted -Repeat CBC in a.m.  Hyperbilirubinemia -Mild and likely reactive -Patient's T bili went from 2.8 and trended down to 2.0 but bumped up to 2.3 -> 2.1 -Continue to monitor and trend and repeat CMP in a.m.  Hyponatremia -Likely in the setting of volume overload -Patient's sodium went from 132 and trended up to 134 -> 132 x2 -Continue with diuresis as above -Continue monitor and trend and repeat CMP in a.m.  GERD/GI prophylaxis -Continue with Esomeprazole substitution with pantoprazole 40 mg p.o. daily  Hyperlipidemia -Continue with Atorvastatin 20 once p.o. daily  BPH -Continue with Doxazosin 4 mg p.o. daily  Goals of Care; DNR, present on admission  DVT prophylaxis: Anticoagulated with Xarelto Code Status: DO NOT RESUSCITATE Family Communication: Discussed with Son's at bedside  Disposition Plan: Pending further clinical improvement and weaning of supplemental oxygen we will repeat chest x-ray in a.m.; Will need PT/OT to further evaluate and treat  Status is: Inpatient  Remains inpatient appropriate because:Unsafe d/c plan, IV treatments appropriate due to intensity of illness or inability to take PO, and Inpatient level of care appropriate due to severity of illness  Dispo: The patient is from: Home  Anticipated d/c is to: Home              Patient currently is not medically stable to d/c.   Difficult to place patient No  Consultants:  IR  Procedures:  Thoracentesis  Successful US guided right thoracentesis. Yielded 650 ml of amber-colored fluid. Pt tolerated procedure well. No immediate complications.  Antimicrobials:  Anti-infectives (From admission, onward)    Start      Dose/Rate Route Frequency Ordered Stop   04/10/21 0900  vancomycin (VANCOREADY) IVPB 1500 mg/300 mL  Status:  Discontinued        1,500 mg 150 mL/hr over 120 Minutes Intravenous Every 24 hours 04/09/21 1739 04/09/21 2122   04/10/21 0600  vancomycin (VANCOREADY) IVPB 1500 mg/300 mL  Status:  Discontinued        1,500 mg 150 mL/hr over 120 Minutes Intravenous Every 24 hours 04/09/21 1402 04/09/21 1739   04/09/21 2300  cefTRIAXone (ROCEPHIN) 2 g in sodium chloride 0.9 % 100 mL IVPB        2 g 200 mL/hr over 30 Minutes Intravenous Every 24 hours 04/09/21 2122 04/14/21 2159   04/09/21 2300  doxycycline (VIBRAMYCIN) 100 mg in sodium chloride 0.9 % 250 mL IVPB        100 mg 125 mL/hr over 120 Minutes Intravenous Every 12 hours 04/09/21 2154     04/09/21 2215  azithromycin (ZITHROMAX) 500 mg in sodium chloride 0.9 % 250 mL IVPB  Status:  Discontinued        500 mg 250 mL/hr over 60 Minutes Intravenous Every 24 hours 04/09/21 2122 04/09/21 2154   04/09/21 1415  vancomycin (VANCOCIN) IVPB 1000 mg/200 mL premix        1,000 mg 200 mL/hr over 60 Minutes Intravenous  Once 04/09/21 1402 04/09/21 1718   04/09/21 1400  ceFEPIme (MAXIPIME) 2 g in sodium chloride 0.9 % 100 mL IVPB        2 g 200 mL/hr over 30 Minutes Intravenous  Once 04/09/21 1347 04/09/21 1509        Subjective: Seen and examined at bedside and he was still feeling a little SOB. Continued to cough but stated he had an ok night after Tussionex. No lightheadness or dizziness. No other concerns or complaints at this time.   Objective: Vitals:   04/12/21 0900 04/12/21 1000 04/12/21 1100 04/12/21 1155  BP:    110/72  Pulse: 73 88 74 72  Resp: (!) 22 15 (!) 27 16  Temp:    97.7 F (36.5 C)  TempSrc:    Oral  SpO2: 92% 94% 91% 95%  Weight:      Height:        Intake/Output Summary (Last 24 hours) at 04/12/2021 1407 Last data filed at 04/12/2021 1010 Gross per 24 hour  Intake 854.07 ml  Output 2000 ml  Net -1145.93 ml     Filed Weights   04/09/21 1035 04/11/21 0327  Weight: 68.9 kg 68.9 kg   Examination: Physical Exam:  Constitutional: Patient is a thin elderly chronically ill-appearing Caucasian male currently and some mild distress still coughing but not significantly Eyes:  Lids and conjunctivae normal, sclerae anicteric  ENMT: External Ears, Nose appear normal. Grossly normal hearing. Neck: Appears normal, supple, no cervical masses, normal ROM, no appreciable thyromegaly; no JVD Respiratory: Diminished to auscultation bilaterally with some coarse breath sounds and some slight crackles, no wheezing, rales, rhonchi or crackles. Normal respiratory effort and patient is not tachypenic. No accessory muscle use.  Wearing supplemental oxygen via nasal cannula Cardiovascular: RRR, no murmurs / rubs / gallops. S1 and S2 auscultated.  Very slight lower extremity edema Abdomen: Soft, non-tender, non-distended. Bowel sounds positive.  GU: Deferred. Musculoskeletal: No clubbing / cyanosis of digits/nails. No joint deformity upper and lower extremities.  Skin: No rashes, lesions, ulcers. No induration; Warm and dry.  Neurologic: CN 2-12 grossly intact with no focal deficits. Romberg sign and cerebellar reflexes not assessed.  Psychiatric: Normal judgment and insight. Alert and oriented x 3. Normal mood and appropriate affect.   Data Reviewed: I have personally reviewed following labs and imaging studies  CBC: Recent Labs  Lab 04/09/21 1047 04/10/21 0337 04/11/21 0139 04/12/21 0146  WBC 7.3 7.9 6.8 6.5  NEUTROABS 6.6  --  5.7 5.4  HGB 10.9* 11.0* 10.3* 10.6*  HCT 33.6* 34.2* 31.9* 32.6*  MCV 85.5 85.9 85.5 85.3  PLT 107* 105* 122* 107*    Basic Metabolic Panel: Recent Labs  Lab 04/09/21 1047 04/10/21 0337 04/11/21 0139 04/12/21 0146  NA 132* 134* 132* 132*  K 3.7 3.8 3.8 4.0  CL 100 100 99 100  CO2 24 27 26 25   GLUCOSE 164* 80 100* 94  BUN 21 14 18 18   CREATININE 0.74 0.77 0.82 0.76   CALCIUM 9.8 9.8 9.7 9.4  MG  --  1.9 1.9 1.8  PHOS  --   --  2.8 2.7    GFR: Estimated Creatinine Clearance: 69.4 mL/min (by C-G formula based on SCr of 0.76 mg/dL). Liver Function Tests: Recent Labs  Lab 04/09/21 1047 04/10/21 0337 04/11/21 0139 04/12/21 0146  AST 22 21 17 29   ALT 26 27 23 26   ALKPHOS 369* 344* 325* 373*  BILITOT 2.8* 2.0* 2.3* 2.1*  PROT 6.4* 6.1* 5.5* 5.3*  ALBUMIN 3.7 3.1* 2.7* 2.6*    No results for input(s): LIPASE, AMYLASE in the last 168 hours. No results for input(s): AMMONIA in the last 168 hours. Coagulation Profile: No results for input(s): INR, PROTIME in the last 168 hours. Cardiac Enzymes: No results for input(s): CKTOTAL, CKMB, CKMBINDEX, TROPONINI in the last 168 hours. BNP (last 3 results) Recent Labs    10/03/20 1340  PROBNP 471    HbA1C: No results for input(s): HGBA1C in the last 72 hours. CBG: No results for input(s): GLUCAP in the last 168 hours. Lipid Profile: No results for input(s): CHOL, HDL, LDLCALC, TRIG, CHOLHDL, LDLDIRECT in the last 72 hours. Thyroid Function Tests: No results for input(s): TSH, T4TOTAL, FREET4, T3FREE, THYROIDAB in the last 72 hours. Anemia Panel: No results for input(s): VITAMINB12, FOLATE, FERRITIN, TIBC, IRON, RETICCTPCT in the last 72 hours. Sepsis Labs: Recent Labs  Lab 04/09/21 2209 04/10/21 0337 04/11/21 0139 04/12/21 0817  PROCALCITON <0.10 <0.10 1.41 <0.10     Recent Results (from the past 240 hour(s))  Resp Panel by RT-PCR (Flu A&B, Covid) Nasopharyngeal Swab     Status: None   Collection Time: 04/09/21 11:47 AM   Specimen: Nasopharyngeal Swab; Nasopharyngeal(NP) swabs in vial transport medium  Result Value Ref Range Status   SARS Coronavirus 2 by RT PCR NEGATIVE NEGATIVE Final    Comment: (NOTE) SARS-CoV-2 target nucleic acids are NOT DETECTED.  The SARS-CoV-2 RNA is generally detectable in upper respiratory specimens during the acute phase of infection. The  lowest concentration of SARS-CoV-2 viral copies this assay can detect is 138 copies/mL. A negative result does not preclude SARS-Cov-2 infection and should not be used as the sole basis for treatment or other  patient management decisions. A negative result may occur with  improper specimen collection/handling, submission of specimen other than nasopharyngeal swab, presence of viral mutation(s) within the areas targeted by this assay, and inadequate number of viral copies(<138 copies/mL). A negative result must be combined with clinical observations, patient history, and epidemiological information. The expected result is Negative.  Fact Sheet for Patients:  EntrepreneurPulse.com.au  Fact Sheet for Healthcare Providers:  IncredibleEmployment.be  This test is no t yet approved or cleared by the [place] FDA and  has been authorized for detection and/or diagnosis of SARS-CoV-2 by FDA under an Emergency Use Authorization (EUA). This EUA will remain  in effect (meaning this test can be used) for the duration of the COVID-19 declaration under Section 564(b)(1) of the Act, 21 U.S.C.section 360bbb-3(b)(1), unless the authorization is terminated  or revoked sooner.       Influenza A by PCR NEGATIVE NEGATIVE Final   Influenza B by PCR NEGATIVE NEGATIVE Final    Comment: (NOTE) The Xpert Xpress SARS-CoV-2/FLU/RSV plus assay is intended as an aid in the diagnosis of influenza from Nasopharyngeal swab specimens and should not be used as a sole basis for treatment. Nasal washings and aspirates are unacceptable for Xpert Xpress SARS-CoV-2/FLU/RSV testing.  Fact Sheet for Patients: EntrepreneurPulse.com.au  Fact Sheet for Healthcare Providers: IncredibleEmployment.be  This test is not yet approved or cleared by the [place] FDA and has been authorized for detection and/or diagnosis of SARS-CoV-2 by FDA under  an Emergency Use Authorization (EUA). This EUA will remain in effect (meaning this test can be used) for the duration of the COVID-19 declaration under Section 564(b)(1) of the Act, 21 U.S.C. section 360bbb-3(b)(1), unless the authorization is terminated or revoked.  Performed at [facility], [address] [place], [place] [address]   Culture, blood (Routine X 2) w Reflex to ID Panel     Status: None (Preliminary result)   Collection Time: 04/09/21  2:25 PM   Specimen: BLOOD  Result Value Ref Range Status   Specimen Description BLOOD LEFT ANTECUBITAL  Final   Special Requests   Final    BOTTLES DRAWN AEROBIC AND ANAEROBIC Blood Culture adequate volume   Culture   Final    NO GROWTH 2 DAYS Performed at [facility], [place] [address], [place], [place] [address]    Report Status PENDING  Incomplete  Culture, blood (Routine X 2) w Reflex to ID Panel     Status: None (Preliminary result)   Collection Time: 04/09/21  2:25 PM   Specimen: BLOOD  Result Value Ref Range Status   Specimen Description BLOOD RIGHT ANTECUBITAL  Final   Special Requests   Final    BOTTLES DRAWN AEROBIC AND ANAEROBIC Blood Culture adequate volume   Culture   Final    NO GROWTH 2 DAYS Performed at [facility], [place] [address] [place], [place] [address]    Report Status PENDING  Incomplete  Gram stain     Status: None   Collection Time: 04/10/21 11:04 AM   Specimen: Lung, Right; Pleural Fluid  Result Value Ref Range Status   Specimen Description FLUID RIGHT PLEURAL  Final   Special Requests NONE  Final   Gram Stain   Final    MODERATE WBC PRESENT,BOTH PMN AND MONONUCLEAR NO ORGANISMS SEEN Performed at [facility], [address], [place], [place] [address]    Report Status 04/10/2021 FINAL  Final  Culture, body fluid w Gram Stain-bottle  Status: None (Preliminary result)   Collection Time: 04/10/21 11:04 AM   Specimen: Fluid  Result Value Ref Range Status    Specimen Description FLUID RIGHT PLEURAL  Final   Special Requests BOTTLES DRAWN AEROBIC AND ANAEROBIC 10CC  Final   Culture   Final    NO GROWTH < 24 HOURS Performed at [facility], [place] [address] [place], [place] [address]    Report Status PENDING  Incomplete     RN Pressure Injury Documentation:     Estimated body mass index is 22.43 kg/m as calculated from the following:   Height as of this encounter: 5\' 9"  (1.753 m).   Weight as of this encounter: 68.9 kg.  Malnutrition Type:   Malnutrition Characteristics:   Nutrition Interventions:     Radiology Studies: DG CHEST PORT 1 VIEW  Result Date: 04/12/2021 CLINICAL DATA:  Shortness of breath. EXAM: PORTABLE CHEST 1 VIEW COMPARISON:  04/11/2021.  Chest CTA dated 04/09/2021. FINDINGS: Stable mildly enlarged cardiac silhouette and tortuous and calcified thoracic aorta. Stable left subclavian bipolar pacemaker leads. Stable dense pericardial calcifications. Stable diffuse prominence of the interstitial markings. Interval mild patchy opacity in the left lower lung zone and right upper lobe with associated mild right upper lobe volume loss. Diffuse osteopenia. IMPRESSION: 1. Interval patchy atelectasis or pneumonia in the left lower lung zone. 2. Interval mild right upper lobe atelectasis. 3. Stable chronic interstitial lung disease. 4. Stable changes of calcific pericarditis. Electronically Signed   By: Claudie Revering M.D.   On: 04/12/2021 11:51   DG CHEST PORT 1 VIEW  Result Date: 04/11/2021 CLINICAL DATA:  Shortness of breath EXAM: PORTABLE CHEST 1 VIEW COMPARISON:  04/10/2021 FINDINGS: Left-sided implanted cardiac device remains in place. Stable cardiomediastinal contours. Aortic atherosclerosis. Diffuse interstitial opacities persist, not appreciably changed from prior. No pleural effusion. No pneumothorax. IMPRESSION: No significant interval change in diffuse interstitial opacities. Electronically Signed   By: Davina Poke  D.O.   On: 04/11/2021 09:29    Scheduled Meds:  atorvastatin  20 mg Oral Daily   dofetilide  250 mcg Oral BID   doxazosin  4 mg Oral Daily   furosemide  20 mg Intravenous Q12H   memantine  10 mg Oral BID   metoprolol succinate  12.5 mg Oral QHS   montelukast  10 mg Oral [id number]   pantoprazole  40 mg Oral Daily   potassium chloride  40 mEq Oral Daily   [START ON 04/13/2021] predniSONE  40 mg Oral Q breakfast   rivaroxaban  20 mg Oral Q supper   Continuous Infusions:  cefTRIAXone (ROCEPHIN)  IV 2 g (04/11/21 2142)   doxycycline (VIBRAMYCIN) IV 100 mg (04/12/21 1011)    LOS: 3 days   Kerney Elbe, DO [facility] PAGER is on AMION  If 7PM-7AM, please contact night-coverage [email or web address]

## 2021-04-12 NOTE — TOC Progression Note (Signed)
Transition of Care [facility]) - Progression Note    Patient Details  Name: Alexander Murray, Dr. MRN: [id number] Date of Birth: 1940-11-20  Transition of Care [facility] CM/SW Contact  Coralee P[place], [place] Phone Number: 04/12/2021, 12:49 PM  Clinical Narrative:    CSW spoke with pt at bedside. He noted he would like to go to rehab at [facility]. He has had two covid vaccines and two boosters. He noted CSW could call his wife to let her know the plan. Pt's wife agreeable. CSW followed up with facility, they stated they prefer admits during the weekday, but can take him whenever he is ready. CSW notified MD and will continue to follow.   Expected Discharge Plan: [place] Barriers to Discharge: Continued Medical Work up  Expected Discharge Plan and Services Expected Discharge Plan: [place] In-house Referral: Clinical Social Work   Post Acute Care Choice:  (TBD) Living arrangements for the past 2 months: [place]                                       Social Determinants of Health (SDOH) Interventions    Readmission Risk Interventions No flowsheet data found.

## 2021-04-12 NOTE — Progress Notes (Signed)
Physical Therapy Treatment Patient Details Name: Alexander Murray, Dr. 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    PT Comments    Progressing well.  Emphasis on standing exercise, and gait stability/stamina with monitoring and adjusting O2 as needed.    Follow Up Recommendations  SNF;Other (comment)     Equipment Recommendations  None recommended by PT    Recommendations for Other Services       Precautions / Restrictions Precautions Precautions: Fall Precaution Comments: Pt. desats into the mid 80s with minimal activity.    Mobility  Bed Mobility               General bed mobility comments: OOB on arriba;    Transfers Overall transfer level: Needs assistance   Transfers: Sit to/from Stand Sit to Stand: Min assist            Ambulation/Gait Ambulation/Gait assistance: Min assist Gait Distance (Feet): 100 Feet Assistive device: Rolling walker (2 wheeled) Gait Pattern/deviations: Step-through pattern Gait velocity: slower Gait velocity interpretation: <1.8 ft/sec, indicate of risk for recurrent falls General Gait Details: During exercise, noted that pt unable to maintain >=90% on 3L,  Started gait on 4lL and pt slowly dropped to 88/89%.  A 6L, pt could maintain 94%.  Overall he appeard asymptomatic except tired.  Generally steady with the RW.  cues to maintain posture.   Stairs             Wheelchair Mobility    Modified Rankin (Stroke Patients Only)       Balance Overall balance assessment: Mild deficits observed, not formally tested                                           Cognition Arousal/Alertness: Awake/alert Behavior During Therapy: WFL for tasks assessed/performed Overall Cognitive Status: Within Functional Limits for tasks assessed                                        Exercises General Exercises - Lower Extremity Hip ABduction/ADduction: AROM;Strengthening;Both;10 reps;Standing Hip Flexion/Marching: AROM;Strengthening;Both;10 reps;Standing Toe Raises: AROM;Strengthening;Both;15 reps;Standing Heel Raises: AROM;Strengthening;Both;15 reps;Standing Mini-Sqauts: AROM;Strengthening;10 reps;Standing    General Comments        Pertinent Vitals/Pain Pain Assessment: Faces Faces Pain Scale: No hurt Pain Intervention(s): Monitored during session    Home Living                      Prior Function            PT Goals (current goals can now be found in the care plan section) Acute Rehab PT Goals Patient Stated Goal: to get back to my normal level of function. PT Goal Formulation: With patient Time For Goal Achievement: 04/18/21 Potential to Achieve Goals: Good Progress towards PT goals: Progressing toward goals    Frequency    Min 3X/week      PT Plan Current plan remains appropriate    Co-evaluation  AM-PAC PT "6 Clicks" Mobility   Outcome Measure  Help needed turning from your back to your side while in a flat bed without using bedrails?: A Little Help needed moving from lying on your back to sitting on the side of a flat bed without using bedrails?: A Little Help needed moving to and from a bed to a chair (including a wheelchair)?: A Little Help needed standing up from a chair using your arms (e.g., wheelchair or bedside chair)?: A Little Help needed to walk in hospital room?: A Little Help needed climbing 3-5 steps with a railing? : A Little 6 Click Score: 18    End of Session Equipment Utilized During Treatment: Oxygen Activity Tolerance:  Patient tolerated treatment well;Other (comment) (mildly tired) Patient left: in bed;with call bell/phone within reach;with family/visitor present Nurse Communication: Mobility status PT Visit Diagnosis: Unsteadiness on feet (R26.81);Other abnormalities of gait and mobility (R26.89);Difficulty in walking, not elsewhere classified (R26.2)     Time: 0623-7628 PT Time Calculation (min) (ACUTE ONLY): 29 min  Charges:  $Gait Training: 8-22 mins $Therapeutic Exercise: 8-22 mins                     04/12/2021  Ginger Carne., PT Acute Rehabilitation Services [phone]  (pager) [phone]  (office)   Tessie Fass Laraine Samet 04/12/2021, 5:48 PM

## 2021-04-12 NOTE — NC FL2 (Signed)
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 [place] [address], [place], [place] [address]      Provider Number: [id number]  Attending Physician Name and Address:  Kerney Elbe, DO  Relative Name and Phone Number:  Kitt Ledet    Current Level of Care: Hospital Recommended Level of Care: [place] Prior Approval Number:    Date Approved/Denied:   PASRR Number: [id number]  Discharge Plan: SNF    Current Diagnoses: Patient Active Problem List   Diagnosis Date Noted   HCAP (healthcare-associated pneumonia) 04/09/2021   Acute respiratory failure with hypoxia ([place]) 04/09/2021   Bilateral pleural effusion 04/09/2021   Thrombocytopenia ([place]) 04/09/2021   Thoracic ascending aortic aneurysm ([place]) 04/09/2021   Pacemaker - MDT 10/15/2020   Acute on chronic diastolic CHF (congestive heart failure) ([place]) 08/24/2020   Secondary hypercoagulable state ([place]) 08/12/2020   Persistent atrial fibrillation ([name])    LLQ abdominal pain 05/10/2020   Goals of care, counseling/discussion 03/19/2020   Encounter for antineoplastic chemotherapy 03/19/2020   Small cell lung cancer, right lower lobe ([place]) 03/12/2020   S/P partial lobectomy of lung 03/11/2020   Postnasal drip 10/13/2016   S/P right TKA 05/20/2015   S/P knee replacement 05/20/2015   Central retinal artery occlusion 06/26/2013    Orientation RESPIRATION BLADDER Height & Weight     Self, Time, Situation, Place  O2 ([place] 2.5) Continent, External catheter Weight: 151 lb 14.4 oz (68.9 kg) Height:  5\' 9"  (175.3 cm)  BEHAVIORAL SYMPTOMS/MOOD NEUROLOGICAL BOWEL NUTRITION STATUS      Continent Diet (See DC summary)  AMBULATORY STATUS COMMUNICATION OF NEEDS Skin   Limited  Assist Verbally Normal                       Personal Care Assistance Level of Assistance  Bathing, Feeding, Dressing Bathing Assistance: Limited assistance Feeding assistance: Limited assistance Dressing Assistance: Limited assistance     Functional Limitations Info  Sight, Hearing, Speech Sight Info: Impaired Hearing Info: Adequate Speech Info: Adequate    SPECIAL CARE FACTORS FREQUENCY  PT (By licensed PT), OT (By licensed OT)     PT Frequency: 5x week OT Frequency: 5x week            Contractures Contractures Info: Not present    Additional Factors Info  Code Status, Allergies Code Status Info: DNR Allergies Info: Lisinopril           Current Medications (04/12/2021):  This is the current hospital active medication list Current Facility-Administered Medications  Medication Dose Route Frequency Provider Last Rate Last Admin   acetaminophen (TYLENOL) tablet 650 mg  650 mg Oral Q6H PRN Opyd, Ilene Qua, MD       Or   acetaminophen (TYLENOL) suppository 650 mg  650 mg Rectal Q6H PRN Opyd, Ilene Qua, MD       albuterol (PROVENTIL) (2.5 MG/3ML) 0.083% nebulizer solution 3 mL  3 mL Inhalation Q6H PRN Opyd, Ilene Qua, MD   3 mL at 04/10/21 2146   atorvastatin (LIPITOR) tablet 20 mg  20 mg Oral Daily Opyd, Ilene Qua, MD   20 mg at 04/12/21 1010   benzonatate (TESSALON) capsule 200 mg  200 mg Oral TID PRN Opyd, Ilene Qua, MD  200 mg at 04/11/21 2147   cefTRIAXone (ROCEPHIN) 2 g in sodium chloride 0.9 % 100 mL IVPB  2 g Intravenous Q24H Opyd, Ilene Qua, MD 200 mL/hr at 04/11/21 2142 2 g at 04/11/21 2142   chlorpheniramine-HYDROcodone (TUSSIONEX) 10-8 MG/5ML suspension 5 mL  5 mL Oral Q12H PRN Sheikh, Omair Latif, DO       dofetilide (TIKOSYN) capsule 250 mcg  250 mcg Oral BID Vianne Bulls, MD   250 mcg at 04/12/21 1011   doxazosin (CARDURA) tablet 4 mg  4 mg Oral Daily Opyd, Ilene Qua, MD   4 mg at 04/12/21 1011   doxycycline (VIBRAMYCIN) 100 mg in sodium chloride  0.9 % 250 mL IVPB  100 mg Intravenous Q12H Opyd, Ilene Qua, MD 125 mL/hr at 04/12/21 1011 100 mg at 04/12/21 1011   furosemide (LASIX) injection 20 mg  20 mg Intravenous Q12H Opyd, Ilene Qua, MD   20 mg at 04/12/21 0616   memantine (NAMENDA) tablet 10 mg  10 mg Oral BID Opyd, Ilene Qua, MD   10 mg at 04/12/21 1011   metoprolol succinate (TOPROL-XL) 24 hr tablet 12.5 mg  12.5 mg Oral QHS Opyd, Ilene Qua, MD   12.5 mg at 04/11/21 2134   montelukast (SINGULAIR) tablet 10 mg  10 mg Oral BH-q7a Vianne Bulls, MD   10 mg at 04/12/21 0615   ondansetron (ZOFRAN) tablet 4 mg  4 mg Oral Q6H PRN Opyd, Ilene Qua, MD       Or   ondansetron (ZOFRAN) injection 4 mg  4 mg Intravenous Q6H PRN Opyd, Ilene Qua, MD       pantoprazole (PROTONIX) EC tablet 40 mg  40 mg Oral Daily Opyd, Ilene Qua, MD   40 mg at 04/12/21 1011   potassium chloride SA (KLOR-CON) CR tablet 40 mEq  40 mEq Oral Daily Opyd, Ilene Qua, MD   40 mEq at 04/12/21 1011   [START ON 04/13/2021] predniSONE (DELTASONE) tablet 40 mg  40 mg Oral Q breakfast Sheikh, Omair Latif, DO       rivaroxaban (XARELTO) tablet 20 mg  20 mg Oral Q supper Opyd, Ilene Qua, MD   20 mg at 04/11/21 1714   senna-docusate (Senokot-S) tablet 1 tablet  1 tablet Oral QHS PRN Opyd, Ilene Qua, MD         Discharge Medications: Please see discharge summary for a list of discharge medications.  Relevant Imaging Results:  Relevant Lab Results:   Additional Information SS# [place], LCSWA

## 2021-04-13 ENCOUNTER — Inpatient Hospital Stay (HOSPITAL_COMMUNITY): Payer: Medicare Other

## 2021-04-13 DIAGNOSIS — R0609 Other forms of dyspnea: Secondary | ICD-10-CM | POA: Diagnosis not present

## 2021-04-13 LAB — COMPREHENSIVE METABOLIC PANEL
ALT: 22 U/L (ref 0–44)
AST: 19 U/L (ref 15–41)
Albumin: 2.4 g/dL — ABNORMAL LOW (ref 3.5–5.0)
Alkaline Phosphatase: 392 U/L — ABNORMAL HIGH (ref 38–126)
Anion gap: 5 (ref 5–15)
BUN: 15 mg/dL (ref 8–23)
CO2: 26 mmol/L (ref 22–32)
Calcium: 9.4 mg/dL (ref 8.9–10.3)
Chloride: 102 mmol/L (ref 98–111)
Creatinine, Ser: 0.81 mg/dL (ref 0.61–1.24)
GFR, Estimated: >60 mL/min (ref >60)
Glucose, Bld: 99 mg/dL (ref 70–99)
Potassium: 4 mmol/L (ref 3.5–5.1)
Sodium: 133 mmol/L — ABNORMAL LOW (ref 135–145)
Total Bilirubin: 1.6 mg/dL — ABNORMAL HIGH (ref 0.3–1.2)
Total Protein: 5.3 g/dL — ABNORMAL LOW (ref 6.5–8.1)

## 2021-04-13 LAB — ECHOCARDIOGRAM COMPLETE
AR max vel: 1.7 cm2
AV Area VTI: 1.52 cm2
AV Area mean vel: 1.69 cm2
AV Mean grad: 3 mmHg
AV Peak grad: 5 mmHg
Ao pk vel: 1.12 m/s
Area-P 1/2: 3.44 cm2
Height: 69 in
S' Lateral: 2.3 cm
Single Plane A4C EF: 66.9 %
Weight: 2476.21 oz

## 2021-04-13 LAB — MAGNESIUM: Magnesium: 1.8 mg/dL (ref 1.7–2.4)

## 2021-04-13 LAB — CBC WITH DIFFERENTIAL/PLATELET
Abs Immature Granulocytes: 0.04 10*3/uL (ref 0.00–0.07)
Basophils Absolute: 0 10*3/uL (ref 0.0–0.1)
Basophils Relative: 0 %
Eosinophils Absolute: 0.2 10*3/uL (ref 0.0–0.5)
Eosinophils Relative: 3 %
HCT: 32 % — ABNORMAL LOW (ref 39.0–52.0)
Hemoglobin: 10.6 g/dL — ABNORMAL LOW (ref 13.0–17.0)
Immature Granulocytes: 1 %
Lymphocytes Relative: 4 %
Lymphs Abs: 0.2 10*3/uL — ABNORMAL LOW (ref 0.7–4.0)
MCH: 28.2 pg (ref 26.0–34.0)
MCHC: 33.1 g/dL (ref 30.0–36.0)
MCV: 85.1 fL (ref 80.0–100.0)
Monocytes Absolute: 0.6 10*3/uL (ref 0.1–1.0)
Monocytes Relative: 12 %
Neutro Abs: 4.3 10*3/uL (ref 1.7–7.7)
Neutrophils Relative %: 80 %
Platelets: 102 10*3/uL — ABNORMAL LOW (ref 150–400)
RBC: 3.76 MIL/uL — ABNORMAL LOW (ref 4.22–5.81)
RDW: 17.7 % — ABNORMAL HIGH (ref 11.5–15.5)
WBC: 5.4 10*3/uL (ref 4.0–10.5)
nRBC: 0 % (ref 0.0–0.2)

## 2021-04-13 LAB — PROCALCITONIN: Procalcitonin: <0.1 ng/mL

## 2021-04-13 LAB — PHOSPHORUS: Phosphorus: 2.4 mg/dL — ABNORMAL LOW (ref 2.5–4.6)

## 2021-04-13 IMAGING — DX DG CHEST 1V PORT
1 series · 1 of 1 positions shown · non-contrast
Comparison: Yesterday

CLINICAL DATA: Shortness of breath

EXAM:
PORTABLE CHEST 1 VIEW

[chest ap]
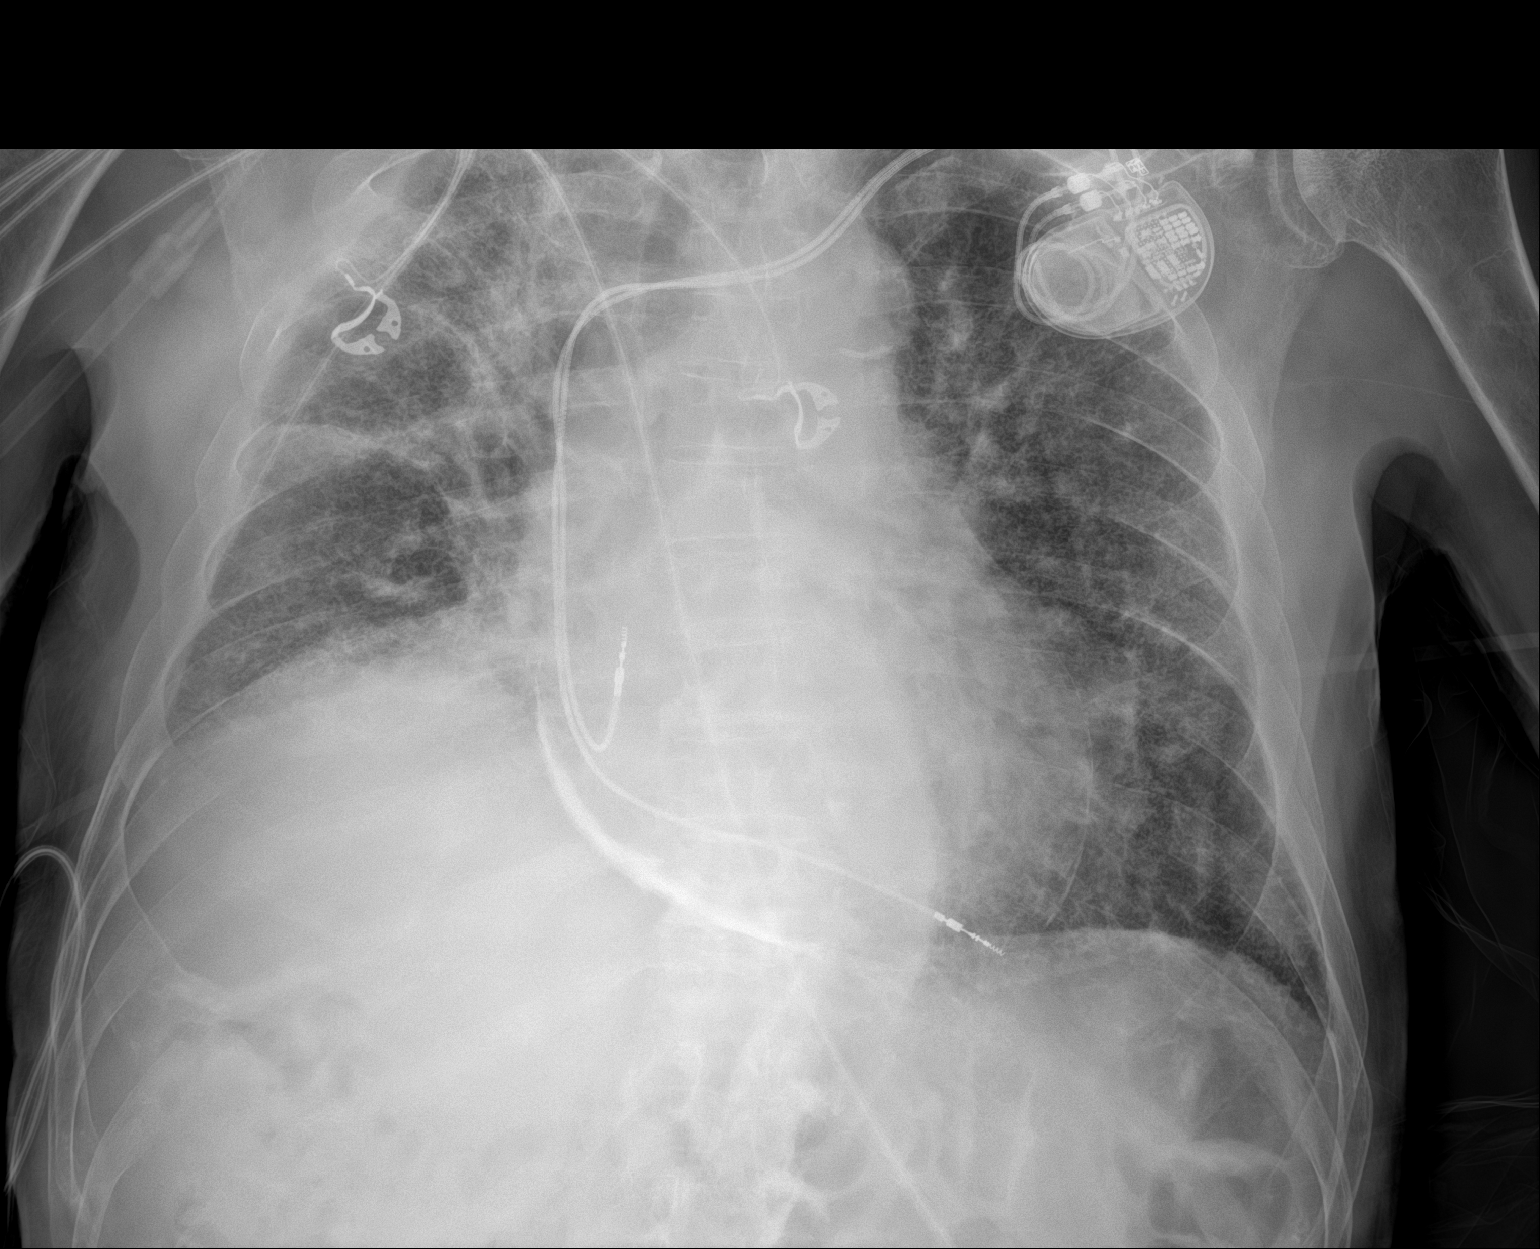

[1 of 1 positions shown; findings below may reference images not displayed]

FINDINGS: Dual lead pacer. Midline trachea. Mild cardiomegaly. Chronic
calcific pericarditis. Moderate right hemidiaphragm elevation. No
pleural effusion or pneumothorax. Diffuse interstitial lung disease.
Worsened right upper and right infrahilar airspace disease. Similar
left lower lung airspace disease.
IMPRESSION: Slightly worsened right-sided aeration, suspicious for pneumonia
superimposed upon interstitial lung disease.

No change in left lower lobe airspace disease.

Aortic Atherosclerosis ([CH]-[CH]).

## 2021-04-13 NOTE — Progress Notes (Signed)
PROGRESS NOTE    [name], Dr.  FGH:[id number] DOB: [birth year] DOA: 04/09/2021 PCP: [name], MD   Brief Narrative:  The patient is an 80 year old Caucasian male with a past medical history significant for but not limited to complete heart block with pacemaker placement, atrial fibrillation on Tikosyn and Xarelto, hypertension, small cell lung cancer status post wedge resection status post chemotherapy and radiation who presented to the emergency room for evaluation of progressive worsening shortness of breath.  Patient recently completed radiation in July and is currently under observation with his oncologist but reports worsening shortness of breath and fatigue for over a little month.  He had been started on prednisone and did not notice much improvement but reported significant worsening when he began to decrease the dose.  He has gone back to 40 mg of prednisone daily but continued to be more dyspneic and has a cough with scant sputum production which is often pink-tinged.  He has had no fevers or chills, chest pain or leg swelling.  He was previously prescribed torsemide but reports that he not been taking it regularly since his legs were not swollen that because his weight has been fairly stable.  He did note a 2 pound weight gain but no swelling.  He was seen at an outside emergency department on 04/05/2021 for worsening dyspnea and was diagnosed with bilateral pneumonia and was started on doxycycline and supplemental oxygen.  He continues to worsen despite this and presented to [place] and found to be saturating in the mid 90s on 3 L of supplemental oxygen and is mildly tachypneic.  BNP was elevated 336 and CT of the chest was done which was negative for PE but was notable for groundglass opacities and interlobular septal thickening on background of fibrotic changes with increased bilateral pleural effusions concerning for pulmonary edema with reflux of contrast into hepatic  veins and possible infection.  Blood cultures obtained and he was admitted and treated with IV Lasix, cefepime and vancomycin and transferred to [facility] for further evaluation management.  After further discussion with the patient patient actually has supplemental oxygen at home and only wears 2 L as needed.  He has been admitted for acute on chronic hypoxic respiratory failure in setting of acute on chronic heart failure with preserved ejection fraction with bilateral pleural effusions and a history of swelling so wound cancer as well as concomitant pneumonia present on admission.  He underwent a thoracentesis during hospitalization. Yesterday morning he did not feel good given that he had a rough night.  PT OT to further evaluate and treat and recommending SNF. Will check an ECHOCardiogram given that he continues to cough and feel a little SOB remaining on O2.  BMP was repeated and is trending down and repeat echocardiogram done and showed normal EF of 60 to 65% and grade 1 diastolic CHF.  Assessment & Plan:   Principal Problem:   Acute respiratory failure with hypoxia ([name]) Active Problems:   Small cell lung cancer, right lower lobe ([name])   Persistent atrial fibrillation ([name])   Acute on chronic diastolic CHF (congestive heart failure) ([name])   Bilateral pleural effusion   Thrombocytopenia ([name])   Thoracic ascending aortic aneurysm ([name])   Acute on chronic hypoxic respiratory failure in the setting of acute on chronic heart failure with preserved ejection fraction with associated bilateral pleural effusions and concomitant pneumonia in a patient with a history of SCLC and asthma -Patient has a history of heart failure  with preserved ejection fraction at [facility] status postresection, chemotherapy and radiation and presented with progressive shortness of breath after being diagnosed with pneumonia on 04/05/2021 despite being started on supplemental oxygen and doxycycline -CT of the chest was done  and showed a large right and moderate left pleural effusions with GGO and septal thickening of the background fibrosis -BNP was elevated on admission at 338 and repeat was 131.5 -ECOHCARDIOGRAM was showing EF of 60-65% and showed G1DD -He is no chills or fevers or leukocytosis -Blood cultures were obtained in the ED and he was treated with IV Lasix, vancomycin and cefepime -IR has been consulted and patient underwent a thoracentesis with fluid analysis including cytology -Continue 20 mg IV Lasix every 12h and change to po Torsemide in the AM and continue Antibiotics with IV Rocephin and IV doxycycline for now; Prednisone was not resumed until today and will resume at 40 mg Daily x5 Days and taper -Strict I's and O's and daily weights -Patient is -9.345 L since admission -Procalcitonin level is less than 0.10 x2 however then trended up to 1.41 but improved to <0.10 -We will continue supplemental oxygen via nasal cannula -SpO2: 95 % O2 Flow Rate (L/min): 2 L/min -Continue with benzonatate 200 mg p.o. 3 times daily as needed for cough as well as albuterol 3 mL inhalations every 6 as needed for wheezing or shortness of breath; will add Tussionex given his significant coughing today -Continuous pulse oximetry maintain O2 saturation greater 90% -Continue supplemental oxygen via nasal cannula and wean O2 as tolerated -Repeat CXR this AM showed "Slightly worsened right-sided aeration, suspicious for pneumonia superimposed upon interstitial lung disease. No change in left lower lobe airspace disease. Aortic Atherosclerosis."  -We will need an ambulatory home O2 screen prior to discharge and repeat chest x-ray in a.m. -If not improving may need to involve pulmonary and will notify his oncologist as a courtesy  Paroxysmal atrial fibrillation -Continue with Tikosyn and Xarelto as well as metoprolol succinate 12.5 mg p.o. daily -Continue telemetry monitoring -He has been in sinus rhythm since  admission  Thrombocytopenia -Appears stable with no active bleeding -Patient's platelet count went from 110 -> 107 -> 105 -> 122 -> 107 -> 102 -Continue to monitor for signs and symptoms bleeding repeat CBC in a.m.  Normocytic anemia -Hemoglobin/markers appear stable at 10.6/32.0 -Check anemia panel in a.m. -Likely anemia of chronic disease -Continue monitor for signs and symptoms of bleeding; currently no overt bleeding noted -Repeat CBC in a.m.  Hyperbilirubinemia -Mild and likely reactive -Patient's T bili went from 2.8 and trended down to 2.0 but bumped up to 2.3 -> 2.1 -> 1.6 -Continue to monitor and trend and repeat CMP in a.m.  Hyponatremia -Likely in the setting of volume overload -Patient's sodium went from 132 and trended up to 134 -> 132 x2 -> 133 -Continue with diuresis as above -Continue monitor and trend and repeat CMP in a.m.  GERD/GI prophylaxis -Continue with Esomeprazole substitution with pantoprazole 40 mg p.o. daily  Hyperlipidemia -Continue with Atorvastatin 20 once p.o. daily  BPH -Continue with Doxazosin 4 mg p.o. daily  Goals of Care; DNR, present on admission  DVT prophylaxis: Anticoagulated with Xarelto Code Status: DO NOT RESUSCITATE Family Communication: Discussed with Son's at bedside  Disposition Plan: Pending further clinical improvement and weaning of supplemental oxygen we will repeat chest x-ray in a.m.; Will need PT/OT to further evaluate and treat  Status is: Inpatient  Remains inpatient appropriate because:Unsafe d/c plan, IV treatments appropriate due  to intensity of illness or inability to take PO, and Inpatient level of care appropriate due to severity of illness  Dispo: The patient is from: Home              Anticipated d/c is to: Home              Patient currently is not medically stable to d/c.   Difficult to place patient No  Consultants:  IR  Procedures:  Thoracentesis  Successful US guided right  thoracentesis. Yielded 650 ml of amber-colored fluid. Pt tolerated procedure well. No immediate complications.  ECHOCARDIOGRAM IMPRESSIONS     1. Left ventricular ejection fraction, by estimation, is 60 to 65%. The  left ventricle has normal function. The left ventricle has no regional  wall motion abnormalities. Left ventricular diastolic parameters are  consistent with Grade I diastolic  dysfunction (impaired relaxation).   2. Right ventricular systolic function is normal. The right ventricular  size is normal. Tricuspid regurgitation signal is inadequate for assessing  PA pressure.   3. Left atrial size was moderately dilated.   4. Right atrial size was moderately dilated.   5. The mitral valve is normal in structure. No evidence of mitral valve  regurgitation. No evidence of mitral stenosis.   6. The aortic valve is tricuspid. Aortic valve regurgitation is not  visualized. No aortic stenosis is present.   7. Aortic dilatation noted. There is mild dilatation of the aortic root,  measuring 42 mm.   8. The inferior vena cava is dilated in size with >50% respiratory  variability, suggesting right atrial pressure of 8 mmHg.   FINDINGS   Left Ventricle: Left ventricular ejection fraction, by estimation, is 60  to 65%. The left ventricle has normal function. The left ventricle has no  regional wall motion abnormalities. The left ventricular internal cavity  size was normal in size. There is   no left ventricular hypertrophy. Left ventricular diastolic parameters  are consistent with Grade I diastolic dysfunction (impaired relaxation).   Right Ventricle: The right ventricular size is normal. No increase in  right ventricular wall thickness. Right ventricular systolic function is  normal. Tricuspid regurgitation signal is inadequate for assessing PA  pressure. The tricuspid regurgitant  velocity is 2.52 m/s, and with an assumed right atrial pressure of 8 mmHg,  the estimated right  ventricular systolic pressure is 53.6 mmHg.   Left Atrium: Left atrial size was moderately dilated.   Right Atrium: Right atrial size was moderately dilated.   Pericardium: There is no evidence of pericardial effusion.   Mitral Valve: The mitral valve is normal in structure. Mild mitral annular  calcification. No evidence of mitral valve regurgitation. No evidence of  mitral valve stenosis.   Tricuspid Valve: The tricuspid valve is normal in structure. Tricuspid  valve regurgitation is mild . No evidence of tricuspid stenosis.   Aortic Valve: The aortic valve is tricuspid. Aortic valve regurgitation is  not visualized. No aortic stenosis is present. Aortic valve mean gradient  measures 3.0 mmHg. Aortic valve peak gradient measures 5.0 mmHg. Aortic  valve area, by VTI measures 1.52  cm.   Pulmonic Valve: The pulmonic valve was normal in structure. Pulmonic valve  regurgitation is not visualized. No evidence of pulmonic stenosis.   Aorta: Aortic dilatation noted. There is mild dilatation of the aortic  root, measuring 42 mm.   Venous: The inferior vena cava is dilated in size with greater than 50%  respiratory variability, suggesting right atrial pressure  of 8 mmHg.   IAS/Shunts: There is left bowing of the interatrial septum, suggestive of  elevated right atrial pressure. The interatrial septum was not well  visualized.   Additional Comments: A device lead is visualized.      LEFT VENTRICLE  PLAX 2D  LVIDd:         3.50 cm     Diastology  LVIDs:         2.30 cm     LV e' medial:    6.20 cm/s  LV PW:         1.00 cm     LV E/e' medial:  9.3  LV IVS:        1.60 cm     LV e' lateral:   8.05 cm/s  LVOT diam:     1.90 cm     LV E/e' lateral: 7.2  LV SV:         39  LV SV Index:   21  LVOT Area:     2.84 cm     LV Volumes (MOD)  LV vol d, MOD A4C: 40.5 ml  LV vol s, MOD A4C: 13.4 ml  LV SV MOD A4C:     40.5 ml   RIGHT VENTRICLE            IVC  RV S prime:     9.03  cm/s  IVC diam: 2.70 cm  TAPSE (M-mode): 1.4 cm   LEFT ATRIUM           Index       RIGHT ATRIUM           Index  LA diam:      3.80 cm 2.05 cm/m  RA Area:     17.00 cm  LA Vol (A2C): 50.1 ml 27.04 ml/m RA Volume:   40.20 ml  21.70 ml/m  LA Vol (A4C): 50.7 ml 27.37 ml/m   AORTIC VALVE  AV Area (Vmax):    1.70 cm  AV Area (Vmean):   1.69 cm  AV Area (VTI):     1.52 cm  AV Vmax:           112.00 cm/s  AV Vmean:          84.300 cm/s  AV VTI:            0.255 m  AV Peak Grad:      5.0 mmHg  AV Mean Grad:      3.0 mmHg  LVOT Vmax:         67.20 cm/s  LVOT Vmean:        50.300 cm/s  LVOT VTI:          0.137 m  LVOT/AV VTI ratio: 0.54     AORTA  Ao Root diam: 3.70 cm  Ao Asc diam:  4.20 cm   MITRAL VALVE               TRICUSPID VALVE  MV Area (PHT): 3.44 cm    TR Peak grad:   25.4 mmHg  MV E velocity: 57.80 cm/s  TR Vmax:        252.00 cm/s  MV A velocity: 53.10 cm/s  MV E/A ratio:  1.09        SHUNTS                             Systemic VTI:  0.14 m  Systemic Diam: 1.90 cm  Antimicrobials:  Anti-infectives (From admission, onward)    Start     Dose/Rate Route Frequency Ordered Stop   04/10/21 0900  vancomycin (VANCOREADY) IVPB 1500 mg/300 mL  Status:  Discontinued        1,500 mg 150 mL/hr over 120 Minutes Intravenous Every 24 hours 04/09/21 1739 04/09/21 2122   04/10/21 0600  vancomycin (VANCOREADY) IVPB 1500 mg/300 mL  Status:  Discontinued        1,500 mg 150 mL/hr over 120 Minutes Intravenous Every 24 hours 04/09/21 1402 04/09/21 1739   04/09/21 2300  cefTRIAXone (ROCEPHIN) 2 g in sodium chloride 0.9 % 100 mL IVPB        2 g 200 mL/hr over 30 Minutes Intravenous Every 24 hours 04/09/21 2122 04/14/21 2159   04/09/21 2300  doxycycline (VIBRAMYCIN) 100 mg in sodium chloride 0.9 % 250 mL IVPB        100 mg 125 mL/hr over 120 Minutes Intravenous Every 12 hours 04/09/21 2154     04/09/21 2215  azithromycin (ZITHROMAX) 500 mg in sodium  chloride 0.9 % 250 mL IVPB  Status:  Discontinued        500 mg 250 mL/hr over 60 Minutes Intravenous Every 24 hours 04/09/21 2122 04/09/21 2154   04/09/21 1415  vancomycin (VANCOCIN) IVPB 1000 mg/200 mL premix        1,000 mg 200 mL/hr over 60 Minutes Intravenous  Once 04/09/21 1402 04/09/21 1718   04/09/21 1400  ceFEPIme (MAXIPIME) 2 g in sodium chloride 0.9 % 100 mL IVPB        2 g 200 mL/hr over 30 Minutes Intravenous  Once 04/09/21 1347 04/09/21 1509        Subjective: Seen and examined at bedside and he was feeling rough a little bit and weak.  States that he received Tussionex late last night and after he did he fell asleep.  Plan was for him to walk with therapy today.  Continues to get diuresis and prednisone was started.  He had a bowel movement finally and is diuresing fairly well.  No other concerns or complaints this time we will continue antibiotics.  He is on day 5 of antibiotics.   Objective: Vitals:   04/13/21 1000 04/13/21 1100 04/13/21 1200 04/13/21 1230  BP:    97/62  Pulse: 78 74 73   Resp: (!) 24 14  16   Temp:      TempSrc:      SpO2: 93% 97% 95%   Weight:      Height:        Intake/Output Summary (Last 24 hours) at 04/13/2021 1609 Last data filed at 04/13/2021 0412 Gross per 24 hour  Intake --  Output 1850 ml  Net -1850 ml    Filed Weights   04/09/21 1035 04/11/21 0327 04/13/21 0300  Weight: 68.9 kg 68.9 kg 70.2 kg   Examination: Physical Exam:  Constitutional: The patient is a thin elderly chronically ill-appearing Caucasian male currently in some slight distress appears calm and mildly uncomfortable Eyes: Lids and conjunctivae normal, sclerae anicteric  ENMT: External Ears, Nose appear normal. Grossly normal hearing. Mucous membranes are moist.  Neck: Appears normal, supple, no cervical masses, normal ROM, no appreciable thyromegaly no appreciable JVD Respiratory: Diminished to auscultation bilaterally with some coarse sounds bilaterally and some  slight crackles and mild rhonchi, no wheezing, rales, rhonchi or crackles. Normal respiratory effort and patient is not tachypenic. No accessory muscle use.  Wearing supplemental  oxygen via nasal cannula Cardiovascular: RRR, no murmurs / rubs / gallops. S1 and S2 auscultated.  Very minimal extremity edema Abdomen: Soft, non-tender, non-distended. Bowel sounds positive.  GU: Deferred. Musculoskeletal: No clubbing / cyanosis of digits/nails. No joint deformity upper and lower extremities.  Skin: No rashes, lesions, ulcers. No induration; Warm and dry.  Neurologic: CN 2-12 grossly intact with no focal deficits. Romberg sign and cerebellar reflexes not assessed.  Psychiatric: Normal judgment and insight. Alert and oriented x 3. Normal mood and appropriate affect.   Data Reviewed: I have personally reviewed following labs and imaging studies  CBC: Recent Labs  Lab 04/09/21 1047 04/10/21 0337 04/11/21 0139 04/12/21 0146 04/13/21 0331  WBC 7.3 7.9 6.8 6.5 5.4  NEUTROABS 6.6  --  5.7 5.4 4.3  HGB 10.9* 11.0* 10.3* 10.6* 10.6*  HCT 33.6* 34.2* 31.9* 32.6* 32.0*  MCV 85.5 85.9 85.5 85.3 85.1  PLT 107* 105* 122* 107* 102*    Basic Metabolic Panel: Recent Labs  Lab 04/09/21 1047 04/10/21 0337 04/11/21 0139 04/12/21 0146 04/13/21 0331  NA 132* 134* 132* 132* 133*  K 3.7 3.8 3.8 4.0 4.0  CL 100 100 99 100 102  CO2 24 27 26 25 26   GLUCOSE 164* 80 100* 94 99  BUN 21 14 18 18 15   CREATININE 0.74 0.77 0.82 0.76 0.81  CALCIUM 9.8 9.8 9.7 9.4 9.4  MG  --  1.9 1.9 1.8 1.8  PHOS  --   --  2.8 2.7 2.4*    GFR: Estimated Creatinine Clearance: 69.8 mL/min (by C-G formula based on SCr of 0.81 mg/dL). Liver Function Tests: Recent Labs  Lab 04/09/21 1047 04/10/21 0337 04/11/21 0139 04/12/21 0146 04/13/21 0331  AST 22 21 17 29 19   ALT 26 27 23 26 22   ALKPHOS 369* 344* 325* 373* 392*  BILITOT 2.8* 2.0* 2.3* 2.1* 1.6*  PROT 6.4* 6.1* 5.5* 5.3* 5.3*  ALBUMIN 3.7 3.1* 2.7* 2.6* 2.4*     No results for input(s): LIPASE, AMYLASE in the last 168 hours. No results for input(s): AMMONIA in the last 168 hours. Coagulation Profile: No results for input(s): INR, PROTIME in the last 168 hours. Cardiac Enzymes: No results for input(s): CKTOTAL, CKMB, CKMBINDEX, TROPONINI in the last 168 hours. BNP (last 3 results) Recent Labs    10/03/20 1340  PROBNP 471    HbA1C: No results for input(s): HGBA1C in the last 72 hours. CBG: No results for input(s): GLUCAP in the last 168 hours. Lipid Profile: No results for input(s): CHOL, HDL, LDLCALC, TRIG, CHOLHDL, LDLDIRECT in the last 72 hours. Thyroid Function Tests: No results for input(s): TSH, T4TOTAL, FREET4, T3FREE, THYROIDAB in the last 72 hours. Anemia Panel: No results for input(s): VITAMINB12, FOLATE, FERRITIN, TIBC, IRON, RETICCTPCT in the last 72 hours. Sepsis Labs: Recent Labs  Lab 04/10/21 0337 04/11/21 0139 04/12/21 0817 04/13/21 0331  PROCALCITON <0.10 1.41 <0.10 <0.10     Recent Results (from the past 240 hour(s))  Resp Panel by RT-PCR (Flu A&B, Covid) Nasopharyngeal Swab     Status: None   Collection Time: 04/09/21 11:47 AM   Specimen: Nasopharyngeal Swab; Nasopharyngeal(NP) swabs in vial transport medium  Result Value Ref Range Status   SARS Coronavirus 2 by RT PCR NEGATIVE NEGATIVE Final    Comment: (NOTE) SARS-CoV-2 target nucleic acids are NOT DETECTED.  The SARS-CoV-2 RNA is generally detectable in upper respiratory specimens during the acute phase of infection. The lowest concentration of SARS-CoV-2 viral copies this assay can detect  is 138 copies/mL. A negative result does not preclude SARS-Cov-2 infection and should not be used as the sole basis for treatment or other patient management decisions. A negative result may occur with  improper specimen collection/handling, submission of specimen other than nasopharyngeal swab, presence of viral mutation(s) within the areas targeted by this  assay, and inadequate number of viral copies(<138 copies/mL). A negative result must be combined with clinical observations, patient history, and epidemiological information. The expected result is Negative.  Fact Sheet for Patients:  EntrepreneurPulse.com.au  Fact Sheet for Healthcare Providers:  IncredibleEmployment.be  This test is no t yet approved or cleared by the [place] FDA and  has been authorized for detection and/or diagnosis of SARS-CoV-2 by FDA under an Emergency Use Authorization (EUA). This EUA will remain  in effect (meaning this test can be used) for the duration of the COVID-19 declaration under Section 564(b)(1) of the Act, 21 U.S.C.section 360bbb-3(b)(1), unless the authorization is terminated  or revoked sooner.       Influenza A by PCR NEGATIVE NEGATIVE Final   Influenza B by PCR NEGATIVE NEGATIVE Final    Comment: (NOTE) The Xpert Xpress SARS-CoV-2/FLU/RSV plus assay is intended as an aid in the diagnosis of influenza from Nasopharyngeal swab specimens and should not be used as a sole basis for treatment. Nasal washings and aspirates are unacceptable for Xpert Xpress SARS-CoV-2/FLU/RSV testing.  Fact Sheet for Patients: EntrepreneurPulse.com.au  Fact Sheet for Healthcare Providers: IncredibleEmployment.be  This test is not yet approved or cleared by the [place] FDA and has been authorized for detection and/or diagnosis of SARS-CoV-2 by FDA under an Emergency Use Authorization (EUA). This EUA will remain in effect (meaning this test can be used) for the duration of the COVID-19 declaration under Section 564(b)(1) of the Act, 21 U.S.C. section 360bbb-3(b)(1), unless the authorization is terminated or revoked.  Performed at [facility], [address] [place], [place] [address]   Culture, blood (Routine X 2) w Reflex to ID Panel     Status: None  (Preliminary result)   Collection Time: 04/09/21  2:25 PM   Specimen: BLOOD  Result Value Ref Range Status   Specimen Description BLOOD LEFT ANTECUBITAL  Final   Special Requests   Final    BOTTLES DRAWN AEROBIC AND ANAEROBIC Blood Culture adequate volume   Culture   Final    NO GROWTH 3 DAYS Performed at [facility], [place] [address], [place], [place] [address]    Report Status PENDING  Incomplete  Culture, blood (Routine X 2) w Reflex to ID Panel     Status: None (Preliminary result)   Collection Time: 04/09/21  2:25 PM   Specimen: BLOOD  Result Value Ref Range Status   Specimen Description BLOOD RIGHT ANTECUBITAL  Final   Special Requests   Final    BOTTLES DRAWN AEROBIC AND ANAEROBIC Blood Culture adequate volume   Culture   Final    NO GROWTH 3 DAYS Performed at [facility], [place] [address] [place], [place] [address]    Report Status PENDING  Incomplete  Gram stain     Status: None   Collection Time: 04/10/21 11:04 AM   Specimen: Lung, Right; Pleural Fluid  Result Value Ref Range Status   Specimen Description FLUID RIGHT PLEURAL  Final   Special Requests NONE  Final   Gram Stain   Final    MODERATE WBC PRESENT,BOTH PMN AND MONONUCLEAR NO ORGANISMS SEEN Performed at [facility], [place] [address], [place], [place] [address]    Report Status 04/10/2021 FINAL  Final  Culture, body fluid w Gram Stain-bottle     Status: None (Preliminary result)   Collection Time: 04/10/21 11:04 AM   Specimen: Fluid  Result Value Ref Range Status   Specimen Description FLUID RIGHT PLEURAL  Final   Special Requests BOTTLES DRAWN AEROBIC AND ANAEROBIC 10CC  Final   Culture   Final    NO GROWTH 2 DAYS Performed at [facility], [place] [address] [place], [place] [address]    Report Status PENDING  Incomplete     RN Pressure Injury Documentation:     Estimated body mass index is 22.85 kg/m as calculated from the following:   Height as of this encounter: 5\' 9"   (1.753 m).   Weight as of this encounter: 70.2 kg.  Malnutrition Type:   Malnutrition Characteristics:   Nutrition Interventions:     Radiology Studies: DG CHEST PORT 1 VIEW  Result Date: 04/13/2021 CLINICAL DATA:  Shortness of breath EXAM: PORTABLE CHEST 1 VIEW COMPARISON:  Yesterday FINDINGS: Dual lead pacer. Midline trachea. Mild cardiomegaly. Chronic calcific pericarditis. Moderate right hemidiaphragm elevation. No pleural effusion or pneumothorax. Diffuse interstitial lung disease. Worsened right upper and right infrahilar airspace disease. Similar left lower lung airspace disease. IMPRESSION: Slightly worsened right-sided aeration, suspicious for pneumonia superimposed upon interstitial lung disease. No change in left lower lobe airspace disease. Aortic Atherosclerosis (ICD10-I70.0). Electronically Signed   By: Abigail Miyamoto M.D.   On: 04/13/2021 09:02   DG CHEST PORT 1 VIEW  Result Date: 04/12/2021 CLINICAL DATA:  Shortness of breath. EXAM: PORTABLE CHEST 1 VIEW COMPARISON:  04/11/2021.  Chest CTA dated 04/09/2021. FINDINGS: Stable mildly enlarged cardiac silhouette and tortuous and calcified thoracic aorta. Stable left subclavian bipolar pacemaker leads. Stable dense pericardial calcifications. Stable diffuse prominence of the interstitial markings. Interval mild patchy opacity in the left lower lung zone and right upper lobe with associated mild right upper lobe volume loss. Diffuse osteopenia. IMPRESSION: 1. Interval patchy atelectasis or pneumonia in the left lower lung zone. 2. Interval mild right upper lobe atelectasis. 3. Stable chronic interstitial lung disease. 4. Stable changes of calcific pericarditis. Electronically Signed   By: Claudie Revering M.D.   On: 04/12/2021 11:51   ECHOCARDIOGRAM COMPLETE  Result Date: 04/13/2021    ECHOCARDIOGRAM REPORT   Patient Name:   AHKEEM GOEDE Date of Exam: 04/13/2021 Medical Rec #:  [id number]      Height:       69.0 in Accession #:    [id number]      Weight:       154.8 lb Date of Birth:  09/29/1940       BSA:          1.853 m Patient Age:    21 years       BP:           106/69 mmHg Patient Gender: M              HR:           77 bpm. Exam Location:  Inpatient Procedure: 2D Echo, Cardiac Doppler and Color Doppler Indications:    dyspnea  History:        Patient has prior history of Echocardiogram examinations, most                 recent 08/14/2020. Pacemaker; Arrythmias:Atrial Fibrillation.  Sonographer:    [place] Referring Phys:  [id number] Terri Rorrer LATIF [name]  1. Left ventricular ejection fraction, by estimation, is 60 to 65%. The left ventricle has normal function. The left ventricle has no regional wall motion abnormalities. Left ventricular diastolic parameters are consistent with Grade I diastolic dysfunction (impaired relaxation).  2. Right ventricular systolic function is normal. The right ventricular size is normal. Tricuspid regurgitation signal is inadequate for assessing PA pressure.  3. Left atrial size was moderately dilated.  4. Right atrial size was moderately dilated.  5. The mitral valve is normal in structure. No evidence of mitral valve regurgitation. No evidence of mitral stenosis.  6. The aortic valve is tricuspid. Aortic valve regurgitation is not visualized. No aortic stenosis is present.  7. Aortic dilatation noted. There is mild dilatation of the aortic root, measuring 42 mm.  8. The inferior vena cava is dilated in size with >50% respiratory variability, suggesting right atrial pressure of 8 mmHg. FINDINGS  Left Ventricle: Left ventricular ejection fraction, by estimation, is 60 to 65%. The left ventricle has normal function. The left ventricle has no regional wall motion abnormalities. The left ventricular internal cavity size was normal in size. There is  no left ventricular hypertrophy. Left ventricular diastolic parameters are consistent with Grade I diastolic dysfunction (impaired relaxation). Right Ventricle: The right  ventricular size is normal. No increase in right ventricular wall thickness. Right ventricular systolic function is normal. Tricuspid regurgitation signal is inadequate for assessing PA pressure. The tricuspid regurgitant velocity is 2.52 m/s, and with an assumed right atrial pressure of 8 mmHg, the estimated right ventricular systolic pressure is 53.2 mmHg. Left Atrium: Left atrial size was moderately dilated. Right Atrium: Right atrial size was moderately dilated. Pericardium: There is no evidence of pericardial effusion. Mitral Valve: The mitral valve is normal in structure. Mild mitral annular calcification. No evidence of mitral valve regurgitation. No evidence of mitral valve stenosis. Tricuspid Valve: The tricuspid valve is normal in structure. Tricuspid valve regurgitation is mild . No evidence of tricuspid stenosis. Aortic Valve: The aortic valve is tricuspid. Aortic valve regurgitation is not visualized. No aortic stenosis is present. Aortic valve mean gradient measures 3.0 mmHg. Aortic valve peak gradient measures 5.0 mmHg. Aortic valve area, by VTI measures 1.52 cm. Pulmonic Valve: The pulmonic valve was normal in structure. Pulmonic valve regurgitation is not visualized. No evidence of pulmonic stenosis. Aorta: Aortic dilatation noted. There is mild dilatation of the aortic root, measuring 42 mm. Venous: The inferior vena cava is dilated in size with greater than 50% respiratory variability, suggesting right atrial pressure of 8 mmHg. IAS/Shunts: There is left bowing of the interatrial septum, suggestive of elevated right atrial pressure. The interatrial septum was not well visualized. Additional Comments: A device lead is visualized.  LEFT VENTRICLE PLAX 2D LVIDd:         3.50 cm     Diastology LVIDs:         2.30 cm     LV e' medial:    6.20 cm/s LV PW:         1.00 cm     LV E/e' medial:  9.3 LV IVS:        1.60 cm     LV e' lateral:   8.05 cm/s LVOT diam:     1.90 cm     LV E/e' lateral: 7.2 LV SV:          39 LV SV Index:   21 LVOT Area:     2.84 cm  LV Volumes (MOD) LV vol d, MOD A4C: 40.5 ml LV vol s, MOD A4C: 13.4 ml LV SV MOD A4C:     40.5 ml RIGHT VENTRICLE            IVC RV S prime:     9.03 cm/s  IVC diam: 2.70 cm TAPSE (M-mode): 1.4 cm LEFT ATRIUM           Index       RIGHT ATRIUM           Index LA diam:      3.80 cm 2.05 cm/m  RA Area:     17.00 cm LA Vol (A2C): 50.1 ml 27.04 ml/m RA Volume:   40.20 ml  21.70 ml/m LA Vol (A4C): 50.7 ml 27.37 ml/m  AORTIC VALVE AV Area (Vmax):    1.70 cm AV Area (Vmean):   1.69 cm AV Area (VTI):     1.52 cm AV Vmax:           112.00 cm/s AV Vmean:          84.300 cm/s AV VTI:            0.255 m AV Peak Grad:      5.0 mmHg AV Mean Grad:      3.0 mmHg LVOT Vmax:         67.20 cm/s LVOT Vmean:        50.300 cm/s LVOT VTI:          0.137 m LVOT/AV VTI ratio: 0.54  AORTA Ao Root diam: 3.70 cm Ao Asc diam:  4.20 cm MITRAL VALVE               TRICUSPID VALVE MV Area (PHT): 3.44 cm    TR Peak grad:   25.4 mmHg MV E velocity: 57.80 cm/s  TR Vmax:        252.00 cm/s MV A velocity: 53.10 cm/s MV E/A ratio:  1.09        SHUNTS                            Systemic VTI:  0.14 m                            Systemic Diam: 1.90 cm Kirk Ruths MD Electronically signed by Kirk Ruths MD Signature Date/Time: 04/13/2021/1:50:53 PM    Final     Scheduled Meds:  atorvastatin  20 mg Oral Daily   dofetilide  250 mcg Oral BID   doxazosin  4 mg Oral Daily   furosemide  20 mg Intravenous Q12H   memantine  10 mg Oral BID   metoprolol succinate  12.5 mg Oral QHS   montelukast  10 mg Oral BH-q7a   pantoprazole  40 mg Oral Daily   potassium chloride  40 mEq Oral Daily   predniSONE  40 mg Oral Q breakfast   rivaroxaban  20 mg Oral Q supper   Continuous Infusions:  cefTRIAXone (ROCEPHIN)  IV 2 g (04/12/21 2201)   doxycycline (VIBRAMYCIN) IV 100 mg (04/13/21 1106)    LOS: 4 days   Kerney Elbe, DO [facility] PAGER is on AMION  If 7PM-7AM, please  contact night-coverage [email or web address]

## 2021-04-13 NOTE — TOC Progression Note (Signed)
Transition of Care [facility]) - Progression Note    Patient Details  Name: Alexander Murray, Dr. MRN: [id number] Date of Birth: 1941-03-21  Transition of Care [facility]) CM/SW Contact  Coralee [place], [place] Phone Number: 04/13/2021, 9:47 AM  Clinical Narrative:     MD noted pt may DC to [facility] on Monday if medically ready. CSW requested covid test be ordered prior to patient discharging. MD aware, facility is able to accept pt whenever ready. TOC will continue to follow.  Expected Discharge Plan: [place] Barriers to Discharge: Continued Medical Work up  Expected Discharge Plan and Services Expected Discharge Plan: [place] In-house Referral: Clinical Social Work   Post Acute Care Choice:  (TBD) Living arrangements for the past 2 months: [place]                                       Social Determinants of Health (SDOH) Interventions    Readmission Risk Interventions No flowsheet data found.

## 2021-04-14 ENCOUNTER — Ambulatory Visit: Payer: Medicare Other | Admitting: Adult Health

## 2021-04-14 ENCOUNTER — Institutional Professional Consult (permissible substitution): Payer: Medicare Other | Admitting: Pulmonary Disease

## 2021-04-14 ENCOUNTER — Other Ambulatory Visit: Payer: Self-pay | Admitting: Radiation Therapy

## 2021-04-14 DIAGNOSIS — J9601 Acute respiratory failure with hypoxia: Secondary | ICD-10-CM

## 2021-04-14 LAB — CBC WITH DIFFERENTIAL/PLATELET
Abs Immature Granulocytes: 0.03 10*3/uL (ref 0.00–0.07)
Basophils Absolute: 0 10*3/uL (ref 0.0–0.1)
Basophils Relative: 0 %
Eosinophils Absolute: 0 10*3/uL (ref 0.0–0.5)
Eosinophils Relative: 1 %
HCT: 31.1 % — ABNORMAL LOW (ref 39.0–52.0)
Hemoglobin: 10 g/dL — ABNORMAL LOW (ref 13.0–17.0)
Immature Granulocytes: 1 %
Lymphocytes Relative: 4 %
Lymphs Abs: 0.2 10*3/uL — ABNORMAL LOW (ref 0.7–4.0)
MCH: 27.4 pg (ref 26.0–34.0)
MCHC: 32.2 g/dL (ref 30.0–36.0)
MCV: 85.2 fL (ref 80.0–100.0)
Monocytes Absolute: 0.5 10*3/uL (ref 0.1–1.0)
Monocytes Relative: 10 %
Neutro Abs: 4.6 10*3/uL (ref 1.7–7.7)
Neutrophils Relative %: 84 %
Platelets: 120 10*3/uL — ABNORMAL LOW (ref 150–400)
RBC: 3.65 MIL/uL — ABNORMAL LOW (ref 4.22–5.81)
RDW: 17.4 % — ABNORMAL HIGH (ref 11.5–15.5)
WBC: 5.4 10*3/uL (ref 4.0–10.5)
nRBC: 0 % (ref 0.0–0.2)

## 2021-04-14 LAB — PROCALCITONIN
Procalcitonin: 43.28 ng/mL
Procalcitonin: <0.1 ng/mL

## 2021-04-14 LAB — CULTURE, BLOOD (ROUTINE X 2)
Culture: NO GROWTH
Culture: NO GROWTH
Special Requests: ADEQUATE
Special Requests: ADEQUATE

## 2021-04-14 LAB — COMPREHENSIVE METABOLIC PANEL
ALT: 25 U/L (ref 0–44)
AST: 21 U/L (ref 15–41)
Albumin: 2.5 g/dL — ABNORMAL LOW (ref 3.5–5.0)
Alkaline Phosphatase: 424 U/L — ABNORMAL HIGH (ref 38–126)
Anion gap: 6 (ref 5–15)
BUN: 18 mg/dL (ref 8–23)
CO2: 25 mmol/L (ref 22–32)
Calcium: 9.5 mg/dL (ref 8.9–10.3)
Chloride: 101 mmol/L (ref 98–111)
Creatinine, Ser: 0.81 mg/dL (ref 0.61–1.24)
GFR, Estimated: >60 mL/min (ref >60)
Glucose, Bld: 124 mg/dL — ABNORMAL HIGH (ref 70–99)
Potassium: 3.9 mmol/L (ref 3.5–5.1)
Sodium: 132 mmol/L — ABNORMAL LOW (ref 135–145)
Total Bilirubin: 1.7 mg/dL — ABNORMAL HIGH (ref 0.3–1.2)
Total Protein: 5.6 g/dL — ABNORMAL LOW (ref 6.5–8.1)

## 2021-04-14 LAB — PHOSPHORUS: Phosphorus: 2.4 mg/dL — ABNORMAL LOW (ref 2.5–4.6)

## 2021-04-14 LAB — MAGNESIUM: Magnesium: 1.8 mg/dL (ref 1.7–2.4)

## 2021-04-14 MED ORDER — METHYLPREDNISOLONE SODIUM SUCC 40 MG IJ SOLR
40.0000 mg | Freq: Four times a day (QID) | INTRAMUSCULAR | Status: DC
  Administered 2021-04-14 – 2021-04-15 (×4): 40 mg via INTRAVENOUS
  Filled 2021-04-14 (×4): qty 1

## 2021-04-14 MED ORDER — K PHOS MONO-SOD PHOS DI & MONO 155-852-130 MG PO TABS
500.0000 mg | ORAL_TABLET | Freq: Once | ORAL | Status: AC
  Administered 2021-04-14: 500 mg via ORAL
  Filled 2021-04-14: qty 2

## 2021-04-14 MED ORDER — MAGNESIUM SULFATE 2 GM/50ML IV SOLN
2.0000 g | Freq: Once | INTRAVENOUS | Status: AC
  Administered 2021-04-14: 2 g via INTRAVENOUS
  Filled 2021-04-14: qty 50

## 2021-04-14 MED ORDER — TORSEMIDE 20 MG PO TABS
40.0000 mg | ORAL_TABLET | Freq: Every day | ORAL | Status: DC
  Administered 2021-04-15: 40 mg via ORAL
  Filled 2021-04-14: qty 2

## 2021-04-14 NOTE — Progress Notes (Signed)
Occupational Therapy Treatment Patient Details Name: Alexander Murray, Dr. 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   OT comments  Pt. Was seen for skilled OT to increase ability to preform ADLs and mobiltiy. Pt. Has increased performance with ADLs and his o2 sats have improved and was able to remain in the 90s on supplemental o2. Pt o2 sats remained in 90s for transfers and with standing during adls. Acute OT to follow.    Recommendations for follow up therapy are one component of a multi-disciplinary discharge planning process, led by the attending physician.  Recommendations may be updated based on patient status, additional functional criteria and insurance authorization.    Follow Up Recommendations  SNF;Home health OT    Equipment Recommendations  None recommended by OT    Recommendations for Other Services      Precautions / Restrictions Precautions Precautions: Fall Precaution Comments: Pt. desats into the mid 80s with minimal activity. Required Braces or Orthoses:  (check O2 sats he desats easily) Restrictions Weight Bearing Restrictions: No       Mobility Bed Mobility Overal bed mobility: Needs Assistance Bed Mobility: Supine to Sit     Supine to sit: Supervision          Transfers Overall transfer level: Needs assistance Equipment used: Rolling walker (2 wheeled) Transfers: Sit to/from Stand Sit to Stand: Min guard         General transfer comment: cues for proper hand placement    Balance Overall balance assessment: Mild deficits observed, not formally tested                                          ADL either performed or assessed with clinical judgement   ADL Overall ADL's : Needs assistance/impaired Eating/Feeding: Independent;Sitting   Grooming: Wash/dry hands;Wash/dry face;Sitting;Set up   Upper Body Bathing: Set up;Sitting   Lower Body Bathing: Minimal assistance;Sit to/from stand   Upper Body Dressing : Minimal assistance;Sitting   Lower Body Dressing: Moderate assistance;Sit to/from stand   Toilet Transfer: Minimal assistance;BSC   Toileting- Clothing Manipulation and Hygiene: Moderate assistance;Sit to/from stand       Functional mobility during ADLs: Minimal assistance (Pt. sats remained in 90s.) General ADL Comments: Pt. sat in recliner for ADLs and o2 sats remained in the 90s.     Vision   Vision Assessment?: No apparent visual deficits   Perception     Praxis      Cognition Arousal/Alertness: Awake/alert Behavior During Therapy: WFL for tasks assessed/performed Overall Cognitive Status: Within Functional Limits for tasks assessed                                          Exercises     Shoulder Instructions       General Comments      Pertinent Vitals/ Pain       Pain Assessment: No/denies pain  Home Living  Prior Functioning/Environment              Frequency  Min 2X/week        Progress Toward Goals  OT Goals(current goals can now be found in the care plan section)  Progress towards OT goals: Progressing toward goals  Acute Rehab OT Goals Patient Stated Goal: get stronger OT Goal Formulation: With patient Time For Goal Achievement: 04/25/21 Potential to Achieve Goals: Good ADL Goals Pt Will Perform Grooming: with modified independence;standing Pt Will Perform Upper Body Bathing: with modified independence;sitting Pt Will Perform Lower Body Bathing: with modified independence;with adaptive  equipment;sit to/from stand Pt Will Perform Upper Body Dressing: with modified independence;sitting Pt Will Perform Lower Body Dressing: with modified independence;with adaptive equipment;sit to/from stand Pt Will Transfer to Toilet: with modified independence;ambulating;grab bars Pt Will Perform Toileting - Clothing Manipulation and hygiene: with modified independence;sit to/from stand  Plan Discharge plan remains appropriate    Co-evaluation                 AM-PAC OT "6 Clicks" Daily Activity     Outcome Measure   Help from another person eating meals?: None Help from another person taking care of personal grooming?: A Little Help from another person toileting, which includes using toliet, bedpan, or urinal?: A Little Help from another person bathing (including washing, rinsing, drying)?: A Little Help from another person to put on and taking off regular upper body clothing?: A Little Help from another person to put on and taking off regular lower body clothing?: A Lot 6 Click Score: 18    End of Session Equipment Utilized During Treatment: Gait belt  OT Visit Diagnosis: Unsteadiness on feet (R26.81);Muscle weakness (generalized) (M62.81)   Activity Tolerance Patient tolerated treatment well   Patient Left in chair;with call bell/phone within reach;with chair alarm set;with family/visitor present   Nurse Communication  (ok therapy)        Time: 1220-1320 OT Time Calculation (min): 60 min  Charges: OT General Charges $OT Visit: 1 Visit OT Treatments $Self Care/Home Management : 38-52 mins $Therapeutic Activity: 8-22 mins  Alexander Murray OT/L   Cleophas Yoak 04/14/2021, 1:28 PM

## 2021-04-14 NOTE — Consult Note (Signed)
NAME:  Alexander Murray, Dr., MRN:  [id number], DOB:  [birth year], LOS: 5 ADMISSION DATE:  04/09/2021, CONSULTATION DATE:  9/12 REFERRING MD:  Dr Alfredia Ferguson, CHIEF COMPLAINT:  Acute on chronic hypoxic respiratory failure   History of Present Illness:  80 yo male with known small cell lung cancer s/p wedge resection with chemotherapy and radiation therapy (completed chest mid June and head July) admitted with dyspnea  Presented to ED on 9/3 for dyspnea, diagnosed with bilateral pneumonia. Started on doxycycline and admitted. He developed worsening symptoms with increasing O2 requirements.  A CTA Chest completed, negative for PE but found to have pulmonary edema. Transferred to [facility] on 9/7.  BNP elevated and started on diuretics with lasix.  Negative 10 L this admission with normal kidney function. Underwent thoracentesis on 9/8 with 650 ml removed and was found to be transudate by light's criteria. Sample sent for cytology and pending. Pulmonary consulted today for persistent symptoms with worsening CXR.  Patient has been followed as outpatient pulmonary for dyspnea. It was felt his dyspnea was multifactorial in setting of deconditioning, pleural effusions, and radiation fibrosis. He was started on steroids at 40 mg daily. Patient reports his symptoms improved, [place] 20 mg and started to have worsening symptoms seeking treatment in ED at [facility], where he was discharged on home O2. He has been off steroids since admission, yesterday restarted on prednisone and patient reports he feels better today compared to yesterday. He continues to tolerate diuresis but no edema on exam.    Pertinent  Medical History  Afib, on xarelto GERD CHF Hypercholesteremia Hypertension Small cell lung cancer Heart block with PPM  Significant Hospital Events: Including procedures, antibiotic start and stop dates in addition to other pertinent events   9/3  Admitt  Interim History / Subjective:  Alert and oriented, denies chest pain or productive cough. Reports he feels better today at rest but continues to have dyspnea with exertion.   Objective   Blood pressure 112/71, pulse 69, temperature 97.7 F (36.5 C), temperature source Oral, resp. rate 18, height 5\' 9"  (1.753 m), weight 70.2 kg, SpO2 97 %.        Intake/Output Summary (Last 24 hours) at 04/14/2021 0900 Last data filed at 04/14/2021 0700 Gross per 24 hour  Intake 350 ml  Output 1300 ml  Net -950 ml   Filed Weights   04/09/21 1035 04/11/21 0327 04/13/21 0300  Weight: 68.9 kg 68.9 kg 70.2 kg    Examination: General: Adult male resting in bed without distress, winded with prolonged conversation  HENT: Atraumatic, normocephalic Lungs: symmetric expansion, crackles at bases, no increased work of breathing or accessory muscle use, no wheezing Cardiovascular: V Paced on telemetry, warm and well perfused, no edema.  Abdomen: soft, non tender, non distended Extremities: no rash, palpable PT bilaterally Neuro: Alert and oriented, moves all extremities equally, clear speech, no focal deficits GU: deferred  Resolved Hospital Problem list     Assessment & Plan:  Dyspnea on exertion Acute pulmonary insufficiency -On room air until recently and discharged from [facility] on 2 L [place] -Ddx includes infection, volume overload, radiation pneumonitis/fibrosis - suspect multifactorial  -Completed 7 day course of Rocephin on 9/12, remains on doxycycline will discontinue today -s/p thoracentesis on 9/8 with 650 ml removed. Transudate effusion with cytology pending -Continue diuresis, renal function tolerating -Symptoms improve with steroid use and suspect radiation pneumonitis/fibrosis contributing, increase steroids to solumedrol 40 mg q  6 x 2-3 days and then wean to prednisone. Recommend prolonged taper  Small cell lung cancer -Currently undergoing surveillance with Dr Earlie Server  Diastolic  dysfunction -Continue diuresis  Elevated pro-calcitonin - < 0.1 on 9/11 and increased to 43 today. No leukocytosis or fever and patient reports he is feeling better.  Hold on broadening antibiotics and repeat in am. If develops worsening symptoms, low threshold to expand antibiotics. Previous MRSA nares negative, repeat.   pAfib - Per primary team  Hyponatremia - Secondary to volume overload, per primary team  Best Practice (right click and "Reselect all SmartList Selections" daily)   Diet/type: Regular consistency (see orders) DVT prophylaxis: DOAC GI prophylaxis: N/A Lines: N/A Foley:  N/A Code Status:  DNR  Labs   CBC: Recent Labs  Lab 04/09/21 1047 04/10/21 0337 04/11/21 0139 04/12/21 0146 04/13/21 0331 04/14/21 0152  WBC 7.3 7.9 6.8 6.5 5.4 5.4  NEUTROABS 6.6  --  5.7 5.4 4.3 4.6  HGB 10.9* 11.0* 10.3* 10.6* 10.6* 10.0*  HCT 33.6* 34.2* 31.9* 32.6* 32.0* 31.1*  MCV 85.5 85.9 85.5 85.3 85.1 85.2  PLT 107* 105* 122* 107* 102* 120*    Basic Metabolic Panel: Recent Labs  Lab 04/10/21 0337 04/11/21 0139 04/12/21 0146 04/13/21 0331 04/14/21 0152  NA 134* 132* 132* 133* 132*  K 3.8 3.8 4.0 4.0 3.9  CL 100 99 100 102 101  CO2 27 26 25 26 25   GLUCOSE 80 100* 94 99 124*  BUN 14 18 18 15 18   CREATININE 0.77 0.82 0.76 0.81 0.81  CALCIUM 9.8 9.7 9.4 9.4 9.5  MG 1.9 1.9 1.8 1.8 1.8  PHOS  --  2.8 2.7 2.4* 2.4*   GFR: Estimated Creatinine Clearance: 69.8 mL/min (by C-G formula based on SCr of 0.81 mg/dL). Recent Labs  Lab 04/11/21 0139 04/12/21 0146 04/12/21 0817 04/13/21 0331 04/14/21 0152  PROCALCITON 1.41  --  <0.10 <0.10 43.28  WBC 6.8 6.5  --  5.4 5.4    Liver Function Tests: Recent Labs  Lab 04/10/21 0337 04/11/21 0139 04/12/21 0146 04/13/21 0331 04/14/21 0152  AST 21 17 29 19 21   ALT 27 23 26 22 25   ALKPHOS 344* 325* 373* 392* 424*  BILITOT 2.0* 2.3* 2.1* 1.6* 1.7*  PROT 6.1* 5.5* 5.3* 5.3* 5.6*  ALBUMIN 3.1* 2.7* 2.6* 2.4* 2.5*   No  results for input(s): LIPASE, AMYLASE in the last 168 hours. No results for input(s): AMMONIA in the last 168 hours.  ABG    Component Value Date/Time   PHART 7.425 03/08/2020 1400   PCO2ART 36.4 03/08/2020 1400   PO2ART 116 (H) 03/08/2020 1400   HCO3 23.4 03/08/2020 1400   TCO2 27 07/29/2020 0930   ACIDBASEDEF 0.4 03/08/2020 1400   O2SAT 98.4 03/08/2020 1400     Coagulation Profile: No results for input(s): INR, PROTIME in the last 168 hours.  Cardiac Enzymes: No results for input(s): CKTOTAL, CKMB, CKMBINDEX, TROPONINI in the last 168 hours.  HbA1C: No results found for: HGBA1C  CBG: No results for input(s): GLUCAP in the last 168 hours.  Review of Systems:   Review of Systems  Constitutional:  Negative for chills and fever.  HENT: Negative.    Respiratory:  Positive for cough and shortness of breath.   Cardiovascular: Negative.   Gastrointestinal: Negative.   Genitourinary: Negative.   Musculoskeletal: Negative.   Skin: Negative.   Neurological: Negative.     Past Medical History:  He,  has a past medical history of Allergy,  Arthritis, Asthma, Atrial fibrillation ([place]) (2021), Bronchitis (07/2016), Central retinal artery occlusion (06/26/2013), CHF (congestive heart failure) ([place]) (08/24/2020), Esophageal stricture, GERD (gastroesophageal reflux disease), Hemochromatosis, Hiatal hernia, Hypercalcemia, Hypercholesteremia, Hypertension, Internal hemorrhoids, Lung cancer ([address] [address]), Osteoarthritis, Palpitations, Persistent atrial fibrillation ([place]), Presence of permanent cardiac pacemaker, Retinal artery occlusion, branch, Second degree AV block, Mobitz type II, Skin cancer, Small cell lung cancer, right lower lobe ([place]) (03/12/2020), Stroke ([place]) (~ 2011), and Tubular adenoma of colon.   Surgical History:   Past Surgical History:  Procedure Laterality Date   CARDIOVERSION N/A 07/29/2020   Procedure: CARDIOVERSION;  Surgeon: Jerline Pain, MD;  Location: [facility];   Service: Cardiovascular;  Laterality: N/A;  NEEDS RAPID COVID TEST MORNING OF   CARDIOVERSION N/A 08/19/2020   Procedure: CARDIOVERSION;  Surgeon: Thayer Headings, MD;  Location: [facility];  Service: Cardiovascular;  Laterality: N/A;   CATARACT EXTRACTION, BILATERAL  01/2017   COLONOSCOPY     EP IMPLANTABLE DEVICE N/A 12/19/2014   Procedure: Pacemaker Implant;  Surgeon: Deboraha Sprang, MD;  Location: [facility];  Service: Cardiovascular;  Laterality: N/A;   ESOPHAGOGASTRODUODENOSCOPY (EGD) WITH ESOPHAGEAL DILATION  X 3   EYE SURGERY Bilateral 2017   INGUINAL HERNIA REPAIR Left 1980's   INSERT / REPLACE / REMOVE PACEMAKER  12/19/2014   INTERCOSTAL NERVE BLOCK Right 03/11/2020   Procedure: INTERCOSTAL NERVE BLOCK;  Surgeon: Grace Isaac, MD;  Location: [facility];  Service: Thoracic;  Laterality: Right;   IR THORACENTESIS ASP PLEURAL SPACE W/IMG GUIDE  04/10/2021   JOINT REPLACEMENT Right 2016   KNEE ARTHROSCOPY Right ~ 1982; ~ [place] Right ~ 2014   "pre-melanoma scapula"   POSTERIOR TIBIAL TENDON REPAIR Left 2012   TONSILLECTOMY  1946   TOTAL KNEE ARTHROPLASTY Right 05/20/2015   Procedure: RIGHT TOTAL KNEE ARTHROPLASTY;  Surgeon: Paralee Cancel, MD;  Location: [facility];  Service: Orthopedics;  Laterality: Right;   UPPER GASTROINTESTINAL ENDOSCOPY     VIDEO BRONCHOSCOPY N/A 03/11/2020   Procedure: VIDEO BRONCHOSCOPY;  Surgeon: Grace Isaac, MD;  Location: [facility];  Service: Thoracic;  Laterality: N/A;   VIDEO BRONCHOSCOPY WITH ENDOBRONCHIAL ULTRASOUND N/A 11/18/2020   Procedure: VIDEO BRONCHOSCOPY WITH ENDOBRONCHIAL ULTRASOUND;  Surgeon: Melrose Nakayama, MD;  Location: [facility];  Service: Thoracic;  Laterality: N/A;   XI ROBOTIC ASSISTED THORACOSCOPY- SEGMENTECTOMY Right 03/11/2020   Procedure: XI ROBOTIC ASSISTED THORACOSCOPY-RIGHT SUPERIOR SEGMENTECTOMY WITH NODE SAMPLES;  Surgeon: Grace Isaac, MD;  Location: [facility];  Service: Thoracic;  Laterality: Right;      Social History:   reports that he quit smoking about 58 years ago. His smoking use included cigarettes. He has never used smokeless tobacco. He reports that he does not drink alcohol and does not use drugs.   Family History:  His family history includes Alzheimer's disease in his mother; Colon cancer in his father and mother; Heart disease in his father and mother; Hemochromatosis in his brother; Prostate cancer in his brother, brother, and father. There is no history of Esophageal cancer, Pancreatic cancer, Stomach cancer, or Liver disease.   Allergies Allergies  Allergen Reactions   Lisinopril Palpitations and Other (See Comments)    Dizziness, also     Home Medications  Prior to Admission medications   Medication Sig Start Date End Date Taking? Authorizing Provider  atorvastatin (LIPITOR) 20 MG tablet TAKE 1 TABLET(20 MG) BY MOUTH DAILY 03/14/21  Yes Deboraha Sprang, MD  benzonatate (TESSALON) 200 MG capsule  Take 1 capsule (200 mg total) by mouth 3 (three) times daily as needed for cough. 03/03/21  Yes Curt Bears, MD  dofetilide (TIKOSYN) 250 MCG capsule Take 1 capsule (250 mcg total) by mouth 2 (two) times daily. 09/04/20  Yes Fenton, Clint R, PA  doxazosin (CARDURA) 4 MG tablet Take 4 mg by mouth daily.  12/27/18  Yes [provider]  esomeprazole (NEXIUM) 20 MG capsule Take 20 mg by mouth daily before breakfast.   Yes [provider]  memantine (NAMENDA) 10 MG tablet Take 1 tablet (10 mg total) by mouth 2 (two) times daily. 12/27/20  Yes Hayden Pedro, PA-C  metoprolol succinate (TOPROL-XL) 25 MG 24 hr tablet Take 0.5 tablets (12.5 mg total) by mouth at bedtime. 12/23/20  Yes Fenton, Clint R, PA  montelukast (SINGULAIR) 10 MG tablet Take 10 mg by mouth every morning.  07/05/13  Yes [provider]  Multiple Vitamins-Minerals (PRESERVISION AREDS 2 PO) Take 1 capsule by mouth in the morning and at bedtime.    Yes [provider]  potassium  chloride SA (KLOR-CON) 20 MEQ tablet Take 33meq (4 tablets by mouth) on Monday, Wednesday and Friday - the days you take your diuretic, Torsemide.  Take 44meq (1 tablet by mouth) on the days you do not take your diuretic. Patient taking differently: Take 20-80 mEq by mouth See admin instructions. Take 80 meq by mouth on Monday, Wednesday and Friday and 20 meq on Tuesday, Thursday, Saturday and Sunday 10/28/20  Yes Deboraha Sprang, MD  predniSONE (DELTASONE) 10 MG tablet Take 4 tabs x 3 days, 2 tabs x 3 days, 1 x 3 days, 1/2 x 3 days and stop 03/26/21  Yes Hunsucker, Bonna Gains, MD  rivaroxaban (XARELTO) 20 MG TABS tablet Take 1 tablet (20 mg total) by mouth daily with supper. 07/05/20  Yes Shirley Friar, PA-C  torsemide (DEMADEX) 20 MG tablet TAKE 2 TABLETS(40 MG) BY MOUTH DAILY 11/29/20  Yes Deboraha Sprang, MD  vitamin C (ASCORBIC ACID) 500 MG tablet Take 500 mg by mouth at bedtime.    Yes [provider]  acetaminophen (TYLENOL) 500 MG tablet Take 500-1,000 mg by mouth every 6 (six) hours as needed for moderate pain.    [provider]  albuterol (VENTOLIN HFA) 108 (90 Base) MCG/ACT inhaler Inhale 1 puff into the lungs every 6 (six) hours as needed for wheezing or shortness of breath. 01/20/21   Hayden Pedro, PA-C  COVID-19 mRNA Vac-TriS, Pfizer, (PFIZER-BIONT COVID-19 VAC-TRIS) SUSP injection Inject into the muscle. 11/19/20   Carlyle Basques, MD

## 2021-04-14 NOTE — Care Management Important Message (Signed)
Important Message  Patient Details  Name: Alexander Murray, Dr. MRN: [id number] Date of Birth: 05/04/1941   Medicare Important Message Given:  Yes     Orbie Pyo 04/14/2021, 3:22 PM

## 2021-04-14 NOTE — Progress Notes (Signed)
PROGRESS NOTE    [name], Dr.  PYP:[id number] DOB: 1941/03/22 DOA: 04/09/2021 PCP: [name], MD   Brief Narrative:  The patient is an 80 year old Caucasian male with a past medical history significant for but not limited to complete heart block with pacemaker placement, atrial fibrillation on Tikosyn and Xarelto, hypertension, small cell lung cancer status post wedge resection status post chemotherapy and radiation who presented to the emergency room for evaluation of progressive worsening shortness of breath.  Patient recently completed radiation in July and is currently under observation with his oncologist but reports worsening shortness of breath and fatigue for over a little month.  He had been started on prednisone and did not notice much improvement but reported significant worsening when he began to decrease the dose.  He has gone back to 40 mg of prednisone daily but continued to be more dyspneic and has a cough with scant sputum production which is often pink-tinged.  He has had no fevers or chills, chest pain or leg swelling.  He was previously prescribed torsemide but reports that he not been taking it regularly since his legs were not swollen that because his weight has been fairly stable.  He did note a 2 pound weight gain but no swelling.  He was seen at an outside emergency department on 04/05/2021 for worsening dyspnea and was diagnosed with bilateral pneumonia and was started on doxycycline and supplemental oxygen.  He continues to worsen despite this and presented to [facility] and found to be saturating in the mid 90s on 3 L of supplemental oxygen and is mildly tachypneic.  BNP was elevated 336 and CT of the chest was done which was negative for PE but was notable for groundglass opacities and interlobular septal thickening on background of fibrotic changes with increased bilateral pleural effusions concerning for pulmonary edema with reflux of contrast into hepatic  veins and possible infection.  Blood cultures obtained and he was admitted and treated with IV Lasix, cefepime and vancomycin and transferred to [facility] for further evaluation management.  After further discussion with the patient patient actually has supplemental oxygen at home and only wears 2 L as needed.  He has been admitted for acute on chronic hypoxic respiratory failure in setting of acute on chronic heart failure with preserved ejection fraction with bilateral pleural effusions and a history of swelling so wound cancer as well as concomitant pneumonia present on admission.  He underwent a thoracentesis during hospitalization. Yesterday morning he did not feel good given that he had a rough night.  PT OT to further evaluate and treat and recommending SNF. Will check an ECHOCardiogram given that he continues to cough and feel a little SOB remaining on O2.  BMP was repeated and is trending down and repeat echocardiogram done and showed normal EF of 60 to 65% and grade 1 diastolic CHF.  Patient adequately diuresed so he was changed to p.o. torsemide in the morning.  Pulmonary is consulted for further evaluation recommendations and they feel that his pulmonary infiltrates may be secondary to radiation pneumonitis.  They are recommending starting him on Solu-Medrol 40 mg every 6 hours for 1 to 2 days and then transition to prednisone and continue diuresis as tolerated.  Assessment & Plan:   Principal Problem:   Acute respiratory failure with hypoxia ([name]) Active Problems:   Small cell lung cancer, right lower lobe ([name])   Persistent atrial fibrillation ([name])   Acute on chronic diastolic CHF (congestive heart failure) (  [place])   Bilateral pleural effusion   Thrombocytopenia ([name])   Thoracic ascending aortic aneurysm ([name])   Acute on chronic hypoxic respiratory failure in the setting of acute on chronic heart failure with preserved ejection fraction with associated bilateral pleural effusions and  concomitant pneumonia/radiation pneumonitis in a patient with a history of SCLC and asthma -Patient has a history of heart failure with preserved ejection fraction at [facility] status postresection, chemotherapy and radiation and presented with progressive shortness of breath after being diagnosed with pneumonia on 04/05/2021 despite being started on supplemental oxygen and doxycycline -CT of the chest was done and showed a large right and moderate left pleural effusions with GGO and septal thickening of the background fibrosis -BNP was elevated on admission at 338 and repeat was 131.5 -ECOHCARDIOGRAM was showing EF of 60-65% and showed G1DD -He is no chills or fevers or leukocytosis -Blood cultures were obtained in the ED and he was treated with IV Lasix, vancomycin and cefepime -IR has been consulted and patient underwent a thoracentesis with fluid analysis including cytology -Continue 20 mg IV Lasix every 12h and change to po Torsemide in the AM and continue Antibiotics with IV Rocephin and IV doxycycline for now; Prednisone was not resumed until yesterday and will resume at 40 mg Daily x5 Days and was going to taper however pulmonary was consulted and they are recommending IV Solu-Medrol 40 mg every 6 for 1 to 2 days and then transition to p.o. prednisone and following diuresis as tolerated chest x-ray -Strict I's and O's and daily weights -Patient is - 10.245 L since admission -Procalcitonin level is less than 0.10 x2 however then trended up to 1.41 but improved to <0.10 and then trended up to 43.28 which is likely a spurious result as repeat this morning was less than 0.10 -We will continue supplemental oxygen via nasal cannula -SpO2: 94 % O2 Flow Rate (L/min): 2 L/min -Continue with benzonatate 200 mg p.o. 3 times daily as needed for cough as well as albuterol 3 mL inhalations every 6 as needed for wheezing or shortness of breath; will add Tussionex given his significant coughing today -Continuous  pulse oximetry maintain O2 saturation greater 90% -Continue supplemental oxygen via nasal cannula and wean O2 as tolerated -Repeat CXR this AM showed "Slightly worsened right-sided aeration, suspicious for pneumonia superimposed upon interstitial lung disease. No change in left lower lobe airspace disease. Aortic Atherosclerosis."  -We will need an ambulatory home O2 screen prior to discharge and repeat chest x-ray in a.m. -Pulmonary was consulted and they feel that he has had radiation pneumonitis so started him back on high-dose IV steroids  Paroxysmal atrial fibrillation -Continue with Tikosyn and Xarelto as well as metoprolol succinate 12.5 mg p.o. daily -Continue telemetry monitoring -He has been in sinus rhythm since admission  Thrombocytopenia -Appears stable with no active bleeding -Patient's platelet count went from 110 -> 107 -> 105 -> 122 -> 107 -> 102 and is now 120 -Continue to monitor for signs and symptoms bleeding repeat CBC in a.m.  Normocytic anemia -Hemoglobin/markers appear stable at 10.6/32.0 yesterday and today it is 10.0/31.1 -Check anemia panel in a.m. -Likely anemia of chronic disease -Continue monitor for signs and symptoms of bleeding; currently no overt bleeding noted -Repeat CBC in a.m.  Hyperbilirubinemia -Mild and likely reactive -Patient's T bili went from 2.8 and trended down to 2.0 but bumped up to 2.3 -> 2.1 -> 1.6 and is now 1.7 -Continue to monitor and trend and repeat CMP in  a.m.  Hyponatremia -Likely in the setting of volume overload -Patient's sodium went from 132 and trended up to 134 -> 132 x2 -> 133 -Continue with diuresis as above -Continue monitor and trend and repeat CMP in a.m.  GERD/GI prophylaxis -Continue with Esomeprazole substitution with pantoprazole 40 mg p.o. daily  Hyperlipidemia -Continue with Atorvastatin 20 once p.o. daily  BPH -Continue with Doxazosin 4 mg p.o. daily  Goals of Care; DNR, present on admission  DVT  prophylaxis: Anticoagulated with Xarelto Code Status: DO NOT RESUSCITATE Family Communication: Discussed with Son's at bedside  Disposition Plan: Pending further clinical improvement and weaning of supplemental oxygen we will repeat chest x-ray in a.m.; Will need PT/OT to further evaluate and treat  Status is: Inpatient  Remains inpatient appropriate because:Unsafe d/c plan, IV treatments appropriate due to intensity of illness or inability to take PO, and Inpatient level of care appropriate due to severity of illness  Dispo: The patient is from: Home              Anticipated d/c is to: Home              Patient currently is not medically stable to d/c.   Difficult to place patient No  Consultants:  IR  Procedures:  Thoracentesis  Successful US guided right thoracentesis. Yielded 650 ml of amber-colored fluid. Pt tolerated procedure well. No immediate complications.  ECHOCARDIOGRAM IMPRESSIONS     1. Left ventricular ejection fraction, by estimation, is 60 to 65%. The  left ventricle has normal function. The left ventricle has no regional  wall motion abnormalities. Left ventricular diastolic parameters are  consistent with Grade I diastolic  dysfunction (impaired relaxation).   2. Right ventricular systolic function is normal. The right ventricular  size is normal. Tricuspid regurgitation signal is inadequate for assessing  PA pressure.   3. Left atrial size was moderately dilated.   4. Right atrial size was moderately dilated.   5. The mitral valve is normal in structure. No evidence of mitral valve  regurgitation. No evidence of mitral stenosis.   6. The aortic valve is tricuspid. Aortic valve regurgitation is not  visualized. No aortic stenosis is present.   7. Aortic dilatation noted. There is mild dilatation of the aortic root,  measuring 42 mm.   8. The inferior vena cava is dilated in size with >50% respiratory  variability, suggesting right atrial pressure of 8  mmHg.   FINDINGS   Left Ventricle: Left ventricular ejection fraction, by estimation, is 60  to 65%. The left ventricle has normal function. The left ventricle has no  regional wall motion abnormalities. The left ventricular internal cavity  size was normal in size. There is   no left ventricular hypertrophy. Left ventricular diastolic parameters  are consistent with Grade I diastolic dysfunction (impaired relaxation).   Right Ventricle: The right ventricular size is normal. No increase in  right ventricular wall thickness. Right ventricular systolic function is  normal. Tricuspid regurgitation signal is inadequate for assessing PA  pressure. The tricuspid regurgitant  velocity is 2.52 m/s, and with an assumed right atrial pressure of 8 mmHg,  the estimated right ventricular systolic pressure is 77.8 mmHg.   Left Atrium: Left atrial size was moderately dilated.   Right Atrium: Right atrial size was moderately dilated.   Pericardium: There is no evidence of pericardial effusion.   Mitral Valve: The mitral valve is normal in structure. Mild mitral annular  calcification. No evidence of mitral valve regurgitation. No  evidence of  mitral valve stenosis.   Tricuspid Valve: The tricuspid valve is normal in structure. Tricuspid  valve regurgitation is mild . No evidence of tricuspid stenosis.   Aortic Valve: The aortic valve is tricuspid. Aortic valve regurgitation is  not visualized. No aortic stenosis is present. Aortic valve mean gradient  measures 3.0 mmHg. Aortic valve peak gradient measures 5.0 mmHg. Aortic  valve area, by VTI measures 1.52  cm.   Pulmonic Valve: The pulmonic valve was normal in structure. Pulmonic valve  regurgitation is not visualized. No evidence of pulmonic stenosis.   Aorta: Aortic dilatation noted. There is mild dilatation of the aortic  root, measuring 42 mm.   Venous: The inferior vena cava is dilated in size with greater than 50%  respiratory  variability, suggesting right atrial pressure of 8 mmHg.   IAS/Shunts: There is left bowing of the interatrial septum, suggestive of  elevated right atrial pressure. The interatrial septum was not well  visualized.   Additional Comments: A device lead is visualized.      LEFT VENTRICLE  PLAX 2D  LVIDd:         3.50 cm     Diastology  LVIDs:         2.30 cm     LV e' medial:    6.20 cm/s  LV PW:         1.00 cm     LV E/e' medial:  9.3  LV IVS:        1.60 cm     LV e' lateral:   8.05 cm/s  LVOT diam:     1.90 cm     LV E/e' lateral: 7.2  LV SV:         39  LV SV Index:   21  LVOT Area:     2.84 cm     LV Volumes (MOD)  LV vol d, MOD A4C: 40.5 ml  LV vol s, MOD A4C: 13.4 ml  LV SV MOD A4C:     40.5 ml   RIGHT VENTRICLE            IVC  RV S prime:     9.03 cm/s  IVC diam: 2.70 cm  TAPSE (M-mode): 1.4 cm   LEFT ATRIUM           Index       RIGHT ATRIUM           Index  LA diam:      3.80 cm 2.05 cm/m  RA Area:     17.00 cm  LA Vol (A2C): 50.1 ml 27.04 ml/m RA Volume:   40.20 ml  21.70 ml/m  LA Vol (A4C): 50.7 ml 27.37 ml/m   AORTIC VALVE  AV Area (Vmax):    1.70 cm  AV Area (Vmean):   1.69 cm  AV Area (VTI):     1.52 cm  AV Vmax:           112.00 cm/s  AV Vmean:          84.300 cm/s  AV VTI:            0.255 m  AV Peak Grad:      5.0 mmHg  AV Mean Grad:      3.0 mmHg  LVOT Vmax:         67.20 cm/s  LVOT Vmean:        50.300 cm/s  LVOT VTI:  0.137 m  LVOT/AV VTI ratio: 0.54     AORTA  Ao Root diam: 3.70 cm  Ao Asc diam:  4.20 cm   MITRAL VALVE               TRICUSPID VALVE  MV Area (PHT): 3.44 cm    TR Peak grad:   25.4 mmHg  MV E velocity: 57.80 cm/s  TR Vmax:        252.00 cm/s  MV A velocity: 53.10 cm/s  MV E/A ratio:  1.09        SHUNTS                             Systemic VTI:  0.14 m                             Systemic Diam: 1.90 cm  Antimicrobials:  Anti-infectives (From admission, onward)    Start     Dose/Rate Route Frequency  Ordered Stop   04/10/21 0900  vancomycin (VANCOREADY) IVPB 1500 mg/300 mL  Status:  Discontinued        1,500 mg 150 mL/hr over 120 Minutes Intravenous Every 24 hours 04/09/21 1739 04/09/21 2122   04/10/21 0600  vancomycin (VANCOREADY) IVPB 1500 mg/300 mL  Status:  Discontinued        1,500 mg 150 mL/hr over 120 Minutes Intravenous Every 24 hours 04/09/21 1402 04/09/21 1739   04/09/21 2300  cefTRIAXone (ROCEPHIN) 2 g in sodium chloride 0.9 % 100 mL IVPB        2 g 200 mL/hr over 30 Minutes Intravenous Every 24 hours 04/09/21 2122 04/14/21 2159   04/09/21 2300  doxycycline (VIBRAMYCIN) 100 mg in sodium chloride 0.9 % 250 mL IVPB  Status:  Discontinued        100 mg 125 mL/hr over 120 Minutes Intravenous Every 12 hours 04/09/21 2154 04/14/21 1029   04/09/21 2215  azithromycin (ZITHROMAX) 500 mg in sodium chloride 0.9 % 250 mL IVPB  Status:  Discontinued        500 mg 250 mL/hr over 60 Minutes Intravenous Every 24 hours 04/09/21 2122 04/09/21 2154   04/09/21 1415  vancomycin (VANCOCIN) IVPB 1000 mg/200 mL premix        1,000 mg 200 mL/hr over 60 Minutes Intravenous  Once 04/09/21 1402 04/09/21 1718   04/09/21 1400  ceFEPIme (MAXIPIME) 2 g in sodium chloride 0.9 % 100 mL IVPB        2 g 200 mL/hr over 30 Minutes Intravenous  Once 04/09/21 1347 04/09/21 1509        Subjective: Seen and examined at bedside and he was feeling okay and about the same and fatigue.  Continues to cough but states the Tussionex helped.  Pulm was consulted and they started him back on high-dose steroids.  He denies any chest pain but still feels very lethargic.  No other concerns or plans at this time.  Objective: Vitals:   04/14/21 0300 04/14/21 0400 04/14/21 0436 04/14/21 1348  BP:   112/71 (!) 110/56  Pulse: 68 66 69 68  Resp: 20  18 16   Temp:   97.7 F (36.5 C) 97.8 F (36.6 C)  TempSrc:   Oral Oral  SpO2: 100% 93% 97% 94%  Weight:      Height:        Intake/Output Summary (Last 24 hours) at  04/14/2021 1834  Last data filed at 04/14/2021 1500 Gross per 24 hour  Intake 400.01 ml  Output 1300 ml  Net -899.99 ml    Filed Weights   04/09/21 1035 04/11/21 0327 04/13/21 0300  Weight: 68.9 kg 68.9 kg 70.2 kg   Examination: Physical Exam:  Constitutional: The patient is a thin elderly chronically ill-appearing Caucasian male currently in mild respiratory distress appears little uncomfortable and fatigued Eyes: Lids and conjunctivae normal, sclerae anicteric  ENMT: External Ears, Nose appear normal. Grossly normal hearing.   Neck: Appears normal, supple, no cervical masses, normal ROM, no appreciable thyromegaly; no JVD Respiratory: Diminished to auscultation bilaterally with coarse breath sounds and some crackles and some slight rhonchi., Normal respiratory effort and patient is not tachypenic. No accessory muscle use.  Wearing supplemental oxygen via nasal cannula Cardiovascular: RRR, no murmurs / rubs / gallops. S1 and S2 auscultated.  Trace extremity edema Abdomen: Soft, non-tender, non-distended. Bowel sounds positive.  GU: Deferred. Musculoskeletal: No clubbing / cyanosis of digits/nails. No joint deformity upper and lower extremities.  Skin: No rashes, lesions, ulcers on limited skin evaluation. No induration; Warm and dry.  Neurologic: CN 2-12 grossly intact with no focal deficits.  Romberg sign and cerebellar reflexes not assessed.  Psychiatric: Normal judgment and insight. Alert and oriented x 3. Normal mood and appropriate affect.   Data Reviewed: I have personally reviewed following labs and imaging studies  CBC: Recent Labs  Lab 04/09/21 1047 04/10/21 0337 04/11/21 0139 04/12/21 0146 04/13/21 0331 04/14/21 0152  WBC 7.3 7.9 6.8 6.5 5.4 5.4  NEUTROABS 6.6  --  5.7 5.4 4.3 4.6  HGB 10.9* 11.0* 10.3* 10.6* 10.6* 10.0*  HCT 33.6* 34.2* 31.9* 32.6* 32.0* 31.1*  MCV 85.5 85.9 85.5 85.3 85.1 85.2  PLT 107* 105* 122* 107* 102* 120*    Basic Metabolic  Panel: Recent Labs  Lab 04/10/21 0337 04/11/21 0139 04/12/21 0146 04/13/21 0331 04/14/21 0152  NA 134* 132* 132* 133* 132*  K 3.8 3.8 4.0 4.0 3.9  CL 100 99 100 102 101  CO2 27 26 25 26 25   GLUCOSE 80 100* 94 99 124*  BUN 14 18 18 15 18   CREATININE 0.77 0.82 0.76 0.81 0.81  CALCIUM 9.8 9.7 9.4 9.4 9.5  MG 1.9 1.9 1.8 1.8 1.8  PHOS  --  2.8 2.7 2.4* 2.4*    GFR: Estimated Creatinine Clearance: 69.8 mL/min (by C-G formula based on SCr of 0.81 mg/dL). Liver Function Tests: Recent Labs  Lab 04/10/21 0337 04/11/21 0139 04/12/21 0146 04/13/21 0331 04/14/21 0152  AST 21 17 29 19 21   ALT 27 23 26 22 25   ALKPHOS 344* 325* 373* 392* 424*  BILITOT 2.0* 2.3* 2.1* 1.6* 1.7*  PROT 6.1* 5.5* 5.3* 5.3* 5.6*  ALBUMIN 3.1* 2.7* 2.6* 2.4* 2.5*    No results for input(s): LIPASE, AMYLASE in the last 168 hours. No results for input(s): AMMONIA in the last 168 hours. Coagulation Profile: No results for input(s): INR, PROTIME in the last 168 hours. Cardiac Enzymes: No results for input(s): CKTOTAL, CKMB, CKMBINDEX, TROPONINI in the last 168 hours. BNP (last 3 results) Recent Labs    10/03/20 1340  PROBNP 471    HbA1C: No results for input(s): HGBA1C in the last 72 hours. CBG: No results for input(s): GLUCAP in the last 168 hours. Lipid Profile: No results for input(s): CHOL, HDL, LDLCALC, TRIG, CHOLHDL, LDLDIRECT in the last 72 hours. Thyroid Function Tests: No results for input(s): TSH, T4TOTAL, FREET4, T3FREE, THYROIDAB in the  last 72 hours. Anemia Panel: No results for input(s): VITAMINB12, FOLATE, FERRITIN, TIBC, IRON, RETICCTPCT in the last 72 hours. Sepsis Labs: Recent Labs  Lab 04/12/21 0817 04/13/21 0331 04/14/21 0152 04/14/21 0857  PROCALCITON <0.10 <0.10 43.28 <0.10     Recent Results (from the past 240 hour(s))  Resp Panel by RT-PCR (Flu A&B, Covid) Nasopharyngeal Swab     Status: None   Collection Time: 04/09/21 11:47 AM   Specimen: Nasopharyngeal Swab;  Nasopharyngeal(NP) swabs in vial transport medium  Result Value Ref Range Status   SARS Coronavirus 2 by RT PCR NEGATIVE NEGATIVE Final    Comment: (NOTE) SARS-CoV-2 target nucleic acids are NOT DETECTED.  The SARS-CoV-2 RNA is generally detectable in upper respiratory specimens during the acute phase of infection. The lowest concentration of SARS-CoV-2 viral copies this assay can detect is 138 copies/mL. A negative result does not preclude SARS-Cov-2 infection and should not be used as the sole basis for treatment or other patient management decisions. A negative result may occur with  improper specimen collection/handling, submission of specimen other than nasopharyngeal swab, presence of viral mutation(s) within the areas targeted by this assay, and inadequate number of viral copies(<138 copies/mL). A negative result must be combined with clinical observations, patient history, and epidemiological information. The expected result is Negative.  Fact Sheet for Patients:  EntrepreneurPulse.com.au  Fact Sheet for Healthcare Providers:  IncredibleEmployment.be  This test is no t yet approved or cleared by the [place] FDA and  has been authorized for detection and/or diagnosis of SARS-CoV-2 by FDA under an Emergency Use Authorization (EUA). This EUA will remain  in effect (meaning this test can be used) for the duration of the COVID-19 declaration under Section 564(b)(1) of the Act, 21 U.S.C.section 360bbb-3(b)(1), unless the authorization is terminated  or revoked sooner.       Influenza A by PCR NEGATIVE NEGATIVE Final   Influenza B by PCR NEGATIVE NEGATIVE Final    Comment: (NOTE) The Xpert Xpress SARS-CoV-2/FLU/RSV plus assay is intended as an aid in the diagnosis of influenza from Nasopharyngeal swab specimens and should not be used as a sole basis for treatment. Nasal washings and aspirates are unacceptable for Xpert Xpress  SARS-CoV-2/FLU/RSV testing.  Fact Sheet for Patients: EntrepreneurPulse.com.au  Fact Sheet for Healthcare Providers: IncredibleEmployment.be  This test is not yet approved or cleared by the [place] FDA and has been authorized for detection and/or diagnosis of SARS-CoV-2 by FDA under an Emergency Use Authorization (EUA). This EUA will remain in effect (meaning this test can be used) for the duration of the COVID-19 declaration under Section 564(b)(1) of the Act, 21 U.S.C. section 360bbb-3(b)(1), unless the authorization is terminated or revoked.  Performed at [facility], [address], [place], [place] [address]   Culture, blood (Routine X 2) w Reflex to ID Panel     Status: None   Collection Time: 04/09/21  2:25 PM   Specimen: BLOOD  Result Value Ref Range Status   Specimen Description BLOOD LEFT ANTECUBITAL  Final   Special Requests   Final    BOTTLES DRAWN AEROBIC AND ANAEROBIC Blood Culture adequate volume   Culture   Final    NO GROWTH 5 DAYS Performed at [facility], [place] [address] [place], [place] [address]    Report Status 04/14/2021 FINAL  Final  Culture, blood (Routine X 2) w Reflex to ID Panel     Status: None   Collection Time: 04/09/21  2:25 PM  Specimen: BLOOD  Result Value Ref Range Status   Specimen Description BLOOD RIGHT ANTECUBITAL  Final   Special Requests   Final    BOTTLES DRAWN AEROBIC AND ANAEROBIC Blood Culture adequate volume   Culture   Final    NO GROWTH 5 DAYS Performed at [facility], [address] [place], [place] [address]    Report Status 04/14/2021 FINAL  Final  Gram stain     Status: None   Collection Time: 04/10/21 11:04 AM   Specimen: Lung, Right; Pleural Fluid  Result Value Ref Range Status   Specimen Description FLUID RIGHT PLEURAL  Final   Special Requests NONE  Final   Gram Stain   Final    MODERATE WBC PRESENT,BOTH PMN AND MONONUCLEAR NO  ORGANISMS SEEN Performed at [facility], [place] [address] [place], [place] [address]    Report Status 04/10/2021 FINAL  Final  Culture, body fluid w Gram Stain-bottle     Status: None (Preliminary result)   Collection Time: 04/10/21 11:04 AM   Specimen: Fluid  Result Value Ref Range Status   Specimen Description FLUID RIGHT PLEURAL  Final   Special Requests BOTTLES DRAWN AEROBIC AND ANAEROBIC 10CC  Final   Culture   Final    NO GROWTH 4 DAYS Performed at [facility], [place] [address] [place],  [address]    Report Status PENDING  Incomplete     RN Pressure Injury Documentation:     Estimated body mass index is 22.85 kg/m as calculated from the following:   Height as of this encounter: 5\' 9"  (1.753 m).   Weight as of this encounter: 70.2 kg.  Malnutrition Type:   Malnutrition Characteristics:   Nutrition Interventions:     Radiology Studies: DG CHEST PORT 1 VIEW  Result Date: 04/13/2021 CLINICAL DATA:  Shortness of breath EXAM: PORTABLE CHEST 1 VIEW COMPARISON:  Yesterday FINDINGS: Dual lead pacer. Midline trachea. Mild cardiomegaly. Chronic calcific pericarditis. Moderate right hemidiaphragm elevation. No pleural effusion or pneumothorax. Diffuse interstitial lung disease. Worsened right upper and right infrahilar airspace disease. Similar left lower lung airspace disease. IMPRESSION: Slightly worsened right-sided aeration, suspicious for pneumonia superimposed upon interstitial lung disease. No change in left lower lobe airspace disease. Aortic Atherosclerosis (ICD10-I70.0). Electronically Signed   By: Abigail Miyamoto M.D.   On: 04/13/2021 09:02   ECHOCARDIOGRAM COMPLETE  Result Date: 04/13/2021    ECHOCARDIOGRAM REPORT   Patient Name:   NELSON NOONE Date of Exam: 04/13/2021 Medical Rec #:  [id number]      Height:       69.0 in Accession #:    [id number]     Weight:       154.8 lb Date of Birth:  02/09/1941       BSA:          1.853 m Patient Age:    41 years        BP:           106/69 mmHg Patient Gender: M              HR:           77 bpm. Exam Location:  Inpatient Procedure: 2D Echo, Cardiac Doppler and Color Doppler Indications:    dyspnea  History:        Patient has prior history of Echocardiogram examinations, most                 recent 08/14/2020. Pacemaker;  Arrythmias:Atrial Fibrillation.  Sonographer:    MH Referring Phys: [id number] Georgina Quint LATIF Lynn  1. Left ventricular ejection fraction, by estimation, is 60 to 65%. The left ventricle has normal function. The left ventricle has no regional wall motion abnormalities. Left ventricular diastolic parameters are consistent with Grade I diastolic dysfunction (impaired relaxation).  2. Right ventricular systolic function is normal. The right ventricular size is normal. Tricuspid regurgitation signal is inadequate for assessing PA pressure.  3. Left atrial size was moderately dilated.  4. Right atrial size was moderately dilated.  5. The mitral valve is normal in structure. No evidence of mitral valve regurgitation. No evidence of mitral stenosis.  6. The aortic valve is tricuspid. Aortic valve regurgitation is not visualized. No aortic stenosis is present.  7. Aortic dilatation noted. There is mild dilatation of the aortic root, measuring 42 mm.  8. The inferior vena cava is dilated in size with >50% respiratory variability, suggesting right atrial pressure of 8 mmHg. FINDINGS  Left Ventricle: Left ventricular ejection fraction, by estimation, is 60 to 65%. The left ventricle has normal function. The left ventricle has no regional wall motion abnormalities. The left ventricular internal cavity size was normal in size. There is  no left ventricular hypertrophy. Left ventricular diastolic parameters are consistent with Grade I diastolic dysfunction (impaired relaxation). Right Ventricle: The right ventricular size is normal. No increase in right ventricular wall thickness. Right ventricular systolic function  is normal. Tricuspid regurgitation signal is inadequate for assessing PA pressure. The tricuspid regurgitant velocity is 2.52 m/s, and with an assumed right atrial pressure of 8 mmHg, the estimated right ventricular systolic pressure is 76.1 mmHg. Left Atrium: Left atrial size was moderately dilated. Right Atrium: Right atrial size was moderately dilated. Pericardium: There is no evidence of pericardial effusion. Mitral Valve: The mitral valve is normal in structure. Mild mitral annular calcification. No evidence of mitral valve regurgitation. No evidence of mitral valve stenosis. Tricuspid Valve: The tricuspid valve is normal in structure. Tricuspid valve regurgitation is mild . No evidence of tricuspid stenosis. Aortic Valve: The aortic valve is tricuspid. Aortic valve regurgitation is not visualized. No aortic stenosis is present. Aortic valve mean gradient measures 3.0 mmHg. Aortic valve peak gradient measures 5.0 mmHg. Aortic valve area, by VTI measures 1.52 cm. Pulmonic Valve: The pulmonic valve was normal in structure. Pulmonic valve regurgitation is not visualized. No evidence of pulmonic stenosis. Aorta: Aortic dilatation noted. There is mild dilatation of the aortic root, measuring 42 mm. Venous: The inferior vena cava is dilated in size with greater than 50% respiratory variability, suggesting right atrial pressure of 8 mmHg. IAS/Shunts: There is left bowing of the interatrial septum, suggestive of elevated right atrial pressure. The interatrial septum was not well visualized. Additional Comments: A device lead is visualized.  LEFT VENTRICLE PLAX 2D LVIDd:         3.50 cm     Diastology LVIDs:         2.30 cm     LV e' medial:    6.20 cm/s LV PW:         1.00 cm     LV E/e' medial:  9.3 LV IVS:        1.60 cm     LV e' lateral:   8.05 cm/s LVOT diam:     1.90 cm     LV E/e' lateral: 7.2 LV SV:         39 LV SV Index:   21  LVOT Area:     2.84 cm  LV Volumes (MOD) LV vol d, MOD A4C: 40.5 ml LV vol s, MOD  A4C: 13.4 ml LV SV MOD A4C:     40.5 ml RIGHT VENTRICLE            IVC RV S prime:     9.03 cm/s  IVC diam: 2.70 cm TAPSE (M-mode): 1.4 cm LEFT ATRIUM           Index       RIGHT ATRIUM           Index LA diam:      3.80 cm 2.05 cm/m  RA Area:     17.00 cm LA Vol (A2C): 50.1 ml 27.04 ml/m RA Volume:   40.20 ml  21.70 ml/m LA Vol (A4C): 50.7 ml 27.37 ml/m  AORTIC VALVE AV Area (Vmax):    1.70 cm AV Area (Vmean):   1.69 cm AV Area (VTI):     1.52 cm AV Vmax:           112.00 cm/s AV Vmean:          84.300 cm/s AV VTI:            0.255 m AV Peak Grad:      5.0 mmHg AV Mean Grad:      3.0 mmHg LVOT Vmax:         67.20 cm/s LVOT Vmean:        50.300 cm/s LVOT VTI:          0.137 m LVOT/AV VTI ratio: 0.54  AORTA Ao Root diam: 3.70 cm Ao Asc diam:  4.20 cm MITRAL VALVE               TRICUSPID VALVE MV Area (PHT): 3.44 cm    TR Peak grad:   25.4 mmHg MV E velocity: 57.80 cm/s  TR Vmax:        252.00 cm/s MV A velocity: 53.10 cm/s MV E/A ratio:  1.09        SHUNTS                            Systemic VTI:  0.14 m                            Systemic Diam: 1.90 cm Kirk Ruths MD Electronically signed by Kirk Ruths MD Signature Date/Time: 04/13/2021/1:50:53 PM    Final     Scheduled Meds:  atorvastatin  20 mg Oral Daily   dofetilide  250 mcg Oral BID   doxazosin  4 mg Oral Daily   memantine  10 mg Oral BID   methylPREDNISolone (SOLU-MEDROL) injection  40 mg Intravenous Q6H   metoprolol succinate  12.5 mg Oral QHS   montelukast  10 mg Oral BH-q7a   pantoprazole  40 mg Oral Daily   potassium chloride  40 mEq Oral Daily   rivaroxaban  20 mg Oral Q supper   [START ON 04/15/2021] torsemide  40 mg Oral Daily   Continuous Infusions:  cefTRIAXone (ROCEPHIN)  IV 2 g (04/13/21 2250)    LOS: 5 days   Kerney Elbe, DO [facility] PAGER is on AMION  If 7PM-7AM, please contact night-coverage [email or web address]

## 2021-04-14 NOTE — TOC Progression Note (Signed)
Transition of Care [facility]) - Progression Note    Patient Details  Name: Alexander Murray, Dr. MRN: [id number] Date of Birth: June 02, 1941  Transition of Care [facility] CM/SW Contact  Joanne Chars, [place] Phone Number: 04/14/2021, 2:23 PM  Clinical Narrative:   CSW spoke with [place] at [facility] and confirmed that they can receive pt to short term rehab on Tuesday or Wednesday.  They do need new covid test.      Expected Discharge Plan: [place] Barriers to Discharge: Continued Medical Work up  Expected Discharge Plan and Services Expected Discharge Plan: [place] In-house Referral: Clinical Social Work   Post Acute Care Choice:  (TBD) Living arrangements for the past 2 months: [place]                                       Social Determinants of Health (SDOH) Interventions    Readmission Risk Interventions No flowsheet data found.

## 2021-04-15 ENCOUNTER — Inpatient Hospital Stay (HOSPITAL_COMMUNITY): Payer: Medicare Other

## 2021-04-15 ENCOUNTER — Telehealth: Payer: Self-pay | Admitting: Pulmonary Disease

## 2021-04-15 DIAGNOSIS — I5033 Acute on chronic diastolic (congestive) heart failure: Secondary | ICD-10-CM

## 2021-04-15 DIAGNOSIS — J90 Pleural effusion, not elsewhere classified: Secondary | ICD-10-CM

## 2021-04-15 DIAGNOSIS — C3431 Malignant neoplasm of lower lobe, right bronchus or lung: Secondary | ICD-10-CM

## 2021-04-15 LAB — COMPREHENSIVE METABOLIC PANEL
ALT: 35 U/L (ref 0–44)
AST: 29 U/L (ref 15–41)
Albumin: 2.8 g/dL — ABNORMAL LOW (ref 3.5–5.0)
Alkaline Phosphatase: 465 U/L — ABNORMAL HIGH (ref 38–126)
Anion gap: 8 (ref 5–15)
BUN: 21 mg/dL (ref 8–23)
CO2: 24 mmol/L (ref 22–32)
Calcium: 10.2 mg/dL (ref 8.9–10.3)
Chloride: 101 mmol/L (ref 98–111)
Creatinine, Ser: 0.76 mg/dL (ref 0.61–1.24)
GFR, Estimated: >60 mL/min (ref >60)
Glucose, Bld: 167 mg/dL — ABNORMAL HIGH (ref 70–99)
Potassium: 4.5 mmol/L (ref 3.5–5.1)
Sodium: 133 mmol/L — ABNORMAL LOW (ref 135–145)
Total Bilirubin: 1.6 mg/dL — ABNORMAL HIGH (ref 0.3–1.2)
Total Protein: 5.9 g/dL — ABNORMAL LOW (ref 6.5–8.1)

## 2021-04-15 LAB — CBC WITH DIFFERENTIAL/PLATELET
Abs Immature Granulocytes: 0.02 10*3/uL (ref 0.00–0.07)
Basophils Absolute: 0 10*3/uL (ref 0.0–0.1)
Basophils Relative: 0 %
Eosinophils Absolute: 0 10*3/uL (ref 0.0–0.5)
Eosinophils Relative: 0 %
HCT: 33.3 % — ABNORMAL LOW (ref 39.0–52.0)
Hemoglobin: 10.8 g/dL — ABNORMAL LOW (ref 13.0–17.0)
Immature Granulocytes: 0 %
Lymphocytes Relative: 2 %
Lymphs Abs: 0.1 10*3/uL — ABNORMAL LOW (ref 0.7–4.0)
MCH: 27.8 pg (ref 26.0–34.0)
MCHC: 32.4 g/dL (ref 30.0–36.0)
MCV: 85.6 fL (ref 80.0–100.0)
Monocytes Absolute: 0.2 10*3/uL (ref 0.1–1.0)
Monocytes Relative: 3 %
Neutro Abs: 5.8 10*3/uL (ref 1.7–7.7)
Neutrophils Relative %: 95 %
Platelets: 128 10*3/uL — ABNORMAL LOW (ref 150–400)
RBC: 3.89 MIL/uL — ABNORMAL LOW (ref 4.22–5.81)
RDW: 17.4 % — ABNORMAL HIGH (ref 11.5–15.5)
WBC: 6.1 10*3/uL (ref 4.0–10.5)
nRBC: 0 % (ref 0.0–0.2)

## 2021-04-15 LAB — CULTURE, BODY FLUID W GRAM STAIN -BOTTLE: Culture: NO GROWTH

## 2021-04-15 LAB — CYTOLOGY - NON PAP

## 2021-04-15 LAB — MAGNESIUM: Magnesium: 2.1 mg/dL (ref 1.7–2.4)

## 2021-04-15 LAB — PROCALCITONIN: Procalcitonin: <0.1 ng/mL

## 2021-04-15 LAB — PHOSPHORUS: Phosphorus: 2.9 mg/dL (ref 2.5–4.6)

## 2021-04-15 IMAGING — DX DG CHEST 1V PORT
1 series · 1 of 1 positions shown · non-contrast
Comparison: [DATE] and [DATE]

CLINICAL DATA: Shortness of breath.

EXAM:
PORTABLE CHEST 1 VIEW

[chest]
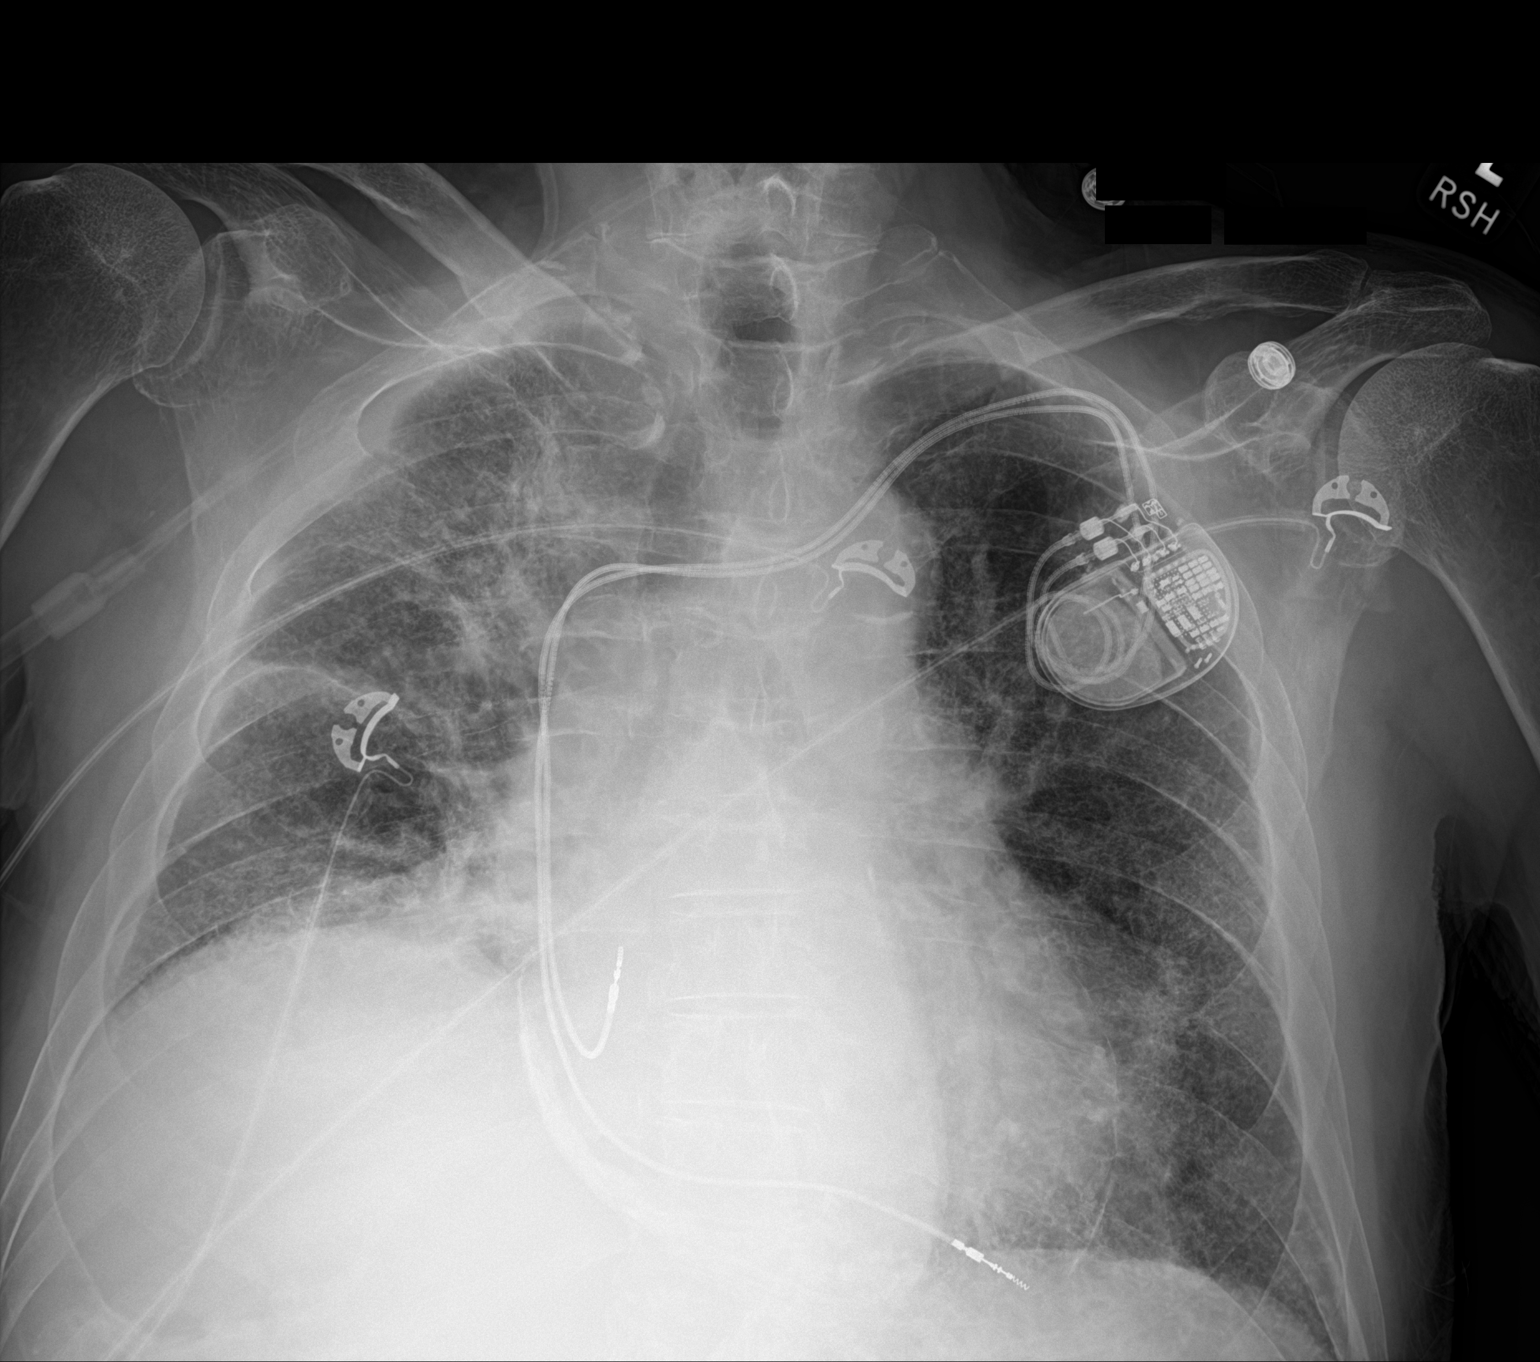

[1 of 1 positions shown; findings below may reference images not displayed]

FINDINGS: Stable appearance of dual-chamber cardiac pacemaker. Elevation of
the right hemidiaphragm. Scattered parenchymal lung densities, right
side greater than left, and minimally changed. Heart size is stable.
Evidence of pericardial calcifications. Stable pleural-based
densities in the lateral right chest.
IMPRESSION: 1. Stable chest radiograph findings.
2. Minimal change in the bilateral parenchymal lung densities, right
side greater than left. Findings likely represent acute or chronic
disease. Suspect right pleural fluid based on previous CT findings.
3. Stable appearance of the heart with a dual-chamber cardiac
pacemaker and pericardial calcifications.

## 2021-04-15 MED ORDER — TORSEMIDE 20 MG PO TABS: 20.0000 mg | ORAL_TABLET | ORAL | Status: DC

## 2021-04-15 MED ORDER — METHYLPREDNISOLONE SODIUM SUCC 40 MG IJ SOLR
40.0000 mg | Freq: Three times a day (TID) | INTRAMUSCULAR | Status: DC
  Administered 2021-04-15 – 2021-04-16 (×3): 40 mg via INTRAVENOUS
  Filled 2021-04-15 (×3): qty 1

## 2021-04-15 NOTE — Progress Notes (Signed)
Physical Therapy Treatment Patient Details Name: Alexander Murray, Dr. MRN: [id number] DOB: 10-25-1940 Today's Date: 04/15/2021   History of Present Illness Dr. Tanish Murray is a 80 y.o. male admitted on 04/09/21 with acute on chronic hypoxic resp failure in the setting of CHF, bil pleural effusions, PNE, and hx of lung CA. S/p thoracentesis on 04/10/21 yielding 650 mL fluid. Pt with medical history including but no limited to complete heart block with pacer, atrial fibrillation on Tikosyn and Xarelto, hypertension, and small cell lung cancer status post wedge resection, chemotherapy, and radiation,    PT Comments    Pt making gradual progress but remains limited by resp status.  He did ambulate 200' but with 5 standing rest breaks. Required 4 L O2 for activity but then dropped to 84% during recovery.  Gait speed indicative of fall risk.  Continue to recommend SNF due to fall risk and very low endurance.   SATURATION QUALIFICATIONS: (This note is used to comply with regulatory documentation for home oxygen)  Patient Saturations on Room Air at Rest = 82%  Patient Saturations on Room Air while Ambulating = n/a%  Patient Saturations on 4 Liters of oxygen while Ambulating = 87%  Please briefly explain why patient needs home oxygen: To maintain oxygen saturation at rest and activity    Recommendations for follow up therapy are one component of a multi-disciplinary discharge planning process, led by the attending physician.  Recommendations may be updated based on patient status, additional functional criteria and insurance authorization.  Follow Up Recommendations  SNF;Other (comment)     Equipment Recommendations  None recommended by PT    Recommendations for Other Services       Precautions / Restrictions Precautions Precautions: Fall Precaution Comments: monitor sats     Mobility  Bed Mobility               General bed mobility comments: OOB at arrival     Transfers Overall transfer level: Needs assistance Equipment used: Rolling walker (2 wheeled) Transfers: Sit to/from Stand Sit to Stand: Min guard         General transfer comment: Performed x 3  Ambulation/Gait Ambulation/Gait assistance: Min guard Gait Distance (Feet): 120 Feet (but with at least 5 standing rest breaks) Assistive device: Rolling walker (2 wheeled) Gait Pattern/deviations: Trunk flexed;Step-to pattern;Decreased stride length Gait velocity: .45 ft/sed Gait velocity interpretation: <1.8 ft/sec, indicate of risk for recurrent falls General Gait Details: Pt taking at least 5 standing rest breaks due to shortness of breath.  Cues for pursed lip breathing and posture.   Stairs             Wheelchair Mobility    Modified Rankin (Stroke Patients Only)       Balance Overall balance assessment: Needs assistance Sitting-balance support: No upper extremity supported Sitting balance-Leahy Scale: Good     Standing balance support: Bilateral upper extremity supported Standing balance-Leahy Scale: Fair Standing balance comment: RW to ambulate and standing exercise                            Cognition Arousal/Alertness: Awake/alert Behavior During Therapy: WFL for tasks assessed/performed Overall Cognitive Status: Within Functional Limits for tasks assessed                                        Exercises Other Exercises Other Exercises:  Standing marching with RW and min guard x 10 with cues for posture    General Comments General comments (skin integrity, edema, etc.): Pt on 3 L O2 rest with sats 92%.  Ambulated on 4L with sats mostly 88-91% but did drop to 87% at times.  With recovery dropped to 84% and required 3 mins to recover to 90's.  Able to wean back to 3 L for rest.      Pertinent Vitals/Pain Pain Assessment: No/denies pain    Home Living                      Prior Function            PT Goals  (current goals can now be found in the care plan section) Acute Rehab PT Goals Patient Stated Goal: get stronger Progress towards PT goals: Progressing toward goals    Frequency    Min 3X/week      PT Plan Current plan remains appropriate    Co-evaluation              AM-PAC PT "6 Clicks" Mobility   Outcome Measure  Help needed turning from your back to your side while in a flat bed without using bedrails?: A Little Help needed moving from lying on your back to sitting on the side of a flat bed without using bedrails?: A Little Help needed moving to and from a bed to a chair (including a wheelchair)?: A Little Help needed standing up from a chair using your arms (e.g., wheelchair or bedside chair)?: A Little Help needed to walk in hospital room?: A Little Help needed climbing 3-5 steps with a railing? : A Little 6 Click Score: 18    End of Session Equipment Utilized During Treatment: Oxygen;Gait belt Activity Tolerance: Patient tolerated treatment well Patient left: in chair;with call bell/phone within reach Nurse Communication: Mobility status PT Visit Diagnosis: Unsteadiness on feet (R26.81);Other abnormalities of gait and mobility (R26.89);Difficulty in walking, not elsewhere classified (R26.2)     Time: 6270-3500 PT Time Calculation (min) (ACUTE ONLY): 25 min  Charges:  $Gait Training: 8-22 mins $Therapeutic Exercise: 8-22 mins                     Alexander Murray, PT Acute Rehab Services Pager [phone] [facility] [name] 04/15/2021, 3:58 PM

## 2021-04-15 NOTE — Telephone Encounter (Signed)
Please make appointment post hospital visit with Dr. Valerie Roys in 2 to 3 weeks  ILD/radiation pneumonitis, with new oxygen start, discharging on 40 mg of prednisone

## 2021-04-15 NOTE — Telephone Encounter (Signed)
MH please advise. Thanks   Dr.Alva was In and discussed fact that X-ray findings and clinical course are not truly consistent with ifection, radiation changes or cardiac failure. My son has been following films and agrees. He wondered about hypersensitivity pneumonitis to some drug I started around the radiation ended. There was memantine which would be a rare  cause. I will try to remember any others.  Is this worth exploring?

## 2021-04-15 NOTE — Consult Note (Signed)
NAME:  Alexander Murray, Dr., MRN:  [id number], DOB:  [birth year], LOS: 6 ADMISSION DATE:  04/09/2021, CONSULTATION DATE:  9/12 REFERRING MD:  Dr Alfredia Ferguson, CHIEF COMPLAINT:  Acute on chronic hypoxic respiratory failure   History of Present Illness:  80 yo male with known small cell lung cancer s/p wedge resection with chemotherapy and radiation therapy (completed chest mid June and head July) admitted with dyspnea  Presented to ED on 9/3 for dyspnea, diagnosed with bilateral pneumonia. Started on doxycycline and admitted. He developed worsening symptoms with increasing O2 requirements.  A CTA Chest completed, negative for PE but found to have pulmonary edema. Transferred to [facility] on 9/7.  BNP elevated and started on diuretics with lasix.  Negative 10 L this admission with normal kidney function. Underwent thoracentesis on 9/8 with 650 ml removed and was found to be transudate by light's criteria. Sample sent for cytology and pending. Pulmonary consulted today for persistent symptoms with worsening CXR.  Patient has been followed as outpatient pulmonary by dr Silas Flood for dyspnea. It was felt his dyspnea was multifactorial in setting of deconditioning, pleural effusions, and radiation fibrosis. He was started on steroids at 40 mg daily. Patient reports his symptoms improved, [place] 20 mg and started to have worsening symptoms seeking treatment in ED at [facility], where he was discharged on home O2. He has been off steroids since admission, yesterday restarted on prednisone and patient reports he feels better today compared to yesterday. He continues to tolerate diuresis but no edema on exam.    Pertinent  Medical History  Afib, on xarelto GERD CHF Hypercholesteremia Hypertension Small cell lung cancer Heart block with PPM  Significant Hospital Events: Including procedures, antibiotic start and stop dates in addition to other pertinent events    9/3 Admitt  Interim History / Subjective:   Frustrated with frequent blood draws. Breathing okay. Remains on 2 L nasal cannula Afebrile Good urine output  Objective   Blood pressure 104/69, pulse 63, temperature 97.6 F (36.4 C), temperature source Oral, resp. rate 11, height 5\' 9"  (1.753 m), weight 70.2 kg, SpO2 95 %.        Intake/Output Summary (Last 24 hours) at 04/15/2021 1150 Last data filed at 04/15/2021 0423 Gross per 24 hour  Intake 50.01 ml  Output 2180 ml  Net -2129.99 ml    Filed Weights   04/09/21 1035 04/11/21 0327 04/13/21 0300  Weight: 68.9 kg 68.9 kg 70.2 kg    Examination: General: Chronically ill-appearing man, sitting in bed, no distress HENT: Atraumatic, normocephalic Lungs: No accessory muscle use, clear breath sounds bilateral, no rhonchi Cardiovascular: V Paced on telemetry, warm and well perfused, no edema.  Abdomen: s soft and nontender Extremities: no rash, palpable PT bilaterally Neuro: Alert and interactive, nonfocal GU: Clear urine, condom catheter.  Labs show stable hyponatremia, normal potassium, high alkaline phosphatase, no leukocytosis, stable anemia and thrombocytopenia  Resolved Hospital Problem list     Assessment & Plan:   Acute respiratory failure with hypoxia -On room air until recently and discharged from [facility] on 2 L [place] -Ddx includes infection, volume overload, radiation pneumonitis/review of serial CT imaging shows new development of ILD on CT 03/2021 which appears increased compared to 10/2020 & 06/2020 -Completed 7 day course of Rocephin and Doxy, low procalcitonin makes ongoing infection unlikely  -Symptoms improve with steroid use and suspect radiation pneumonitis/fibrosis contributing, etiology of ILD unclear but likely inflammatory.  He currently  is debilitated and doubt that we can pursue transbronchial biopsies.  He appears well diuresed and dry. -We will continue IV Solu-Medrol every 8 and plan for discharge in  24 hours on 40 mg of prednisone as a prolonged course.  Hopefully we can reassess 2 to 3 weeks as outpatient and decide on slow steroid taper  Small cell lung cancer -Recurrence with right hilar lymph node status post RT to mediastinum -Currently undergoing surveillance with Dr Earlie Server  Pleural effusions, transudative --s/p thoracentesis on 9/8 with 650 ml removed.  -Cytology favoring atypical mesothelial cells  Diastolic dysfunction -Continue diuresis  pAfib - Per primary team   Discussed about treatment plan with patient and his wife and hospitalist in the room, will CC his primary pulmonologist Dr. Silas Flood  Best Practice (right click and "Reselect all SmartList Selections" daily)   Diet/type: Regular consistency (see orders) DVT prophylaxis: DOAC GI prophylaxis: N/A Lines: N/A Foley:  N/A Code Status:  DNR  Labs   CBC: Recent Labs  Lab 04/11/21 0139 04/12/21 0146 04/13/21 0331 04/14/21 0152 04/15/21 0946  WBC 6.8 6.5 5.4 5.4 6.1  NEUTROABS 5.7 5.4 4.3 4.6 5.8  HGB 10.3* 10.6* 10.6* 10.0* 10.8*  HCT 31.9* 32.6* 32.0* 31.1* 33.3*  MCV 85.5 85.3 85.1 85.2 85.6  PLT 122* 107* 102* 120* 128*     Basic Metabolic Panel: Recent Labs  Lab 04/11/21 0139 04/12/21 0146 04/13/21 0331 04/14/21 0152 04/15/21 0946  NA 132* 132* 133* 132* 133*  K 3.8 4.0 4.0 3.9 4.5  CL 99 100 102 101 101  CO2 26 25 26 25 24   GLUCOSE 100* 94 99 124* 167*  BUN 18 18 15 18 21   CREATININE 0.82 0.76 0.81 0.81 0.76  CALCIUM 9.7 9.4 9.4 9.5 10.2  MG 1.9 1.8 1.8 1.8 2.1  PHOS 2.8 2.7 2.4* 2.4* 2.9    GFR: Estimated Creatinine Clearance: 70.7 mL/min (by C-G formula based on SCr of 0.76 mg/dL). Recent Labs  Lab 04/12/21 0146 04/12/21 0817 04/13/21 0331 04/14/21 0152 04/14/21 0857 04/15/21 0946  PROCALCITON  --  <0.10 <0.10 43.28 <0.10  --   WBC 6.5  --  5.4 5.4  --  6.1     Liver Function Tests: Recent Labs  Lab 04/11/21 0139 04/12/21 0146 04/13/21 0331 04/14/21 0152  04/15/21 0946  AST 17 29 19 21 29   ALT 23 26 22 25  35  ALKPHOS 325* 373* 392* 424* 465*  BILITOT 2.3* 2.1* 1.6* 1.7* 1.6*  PROT 5.5* 5.3* 5.3* 5.6* 5.9*  ALBUMIN 2.7* 2.6* 2.4* 2.5* 2.8*    No results for input(s): LIPASE, AMYLASE in the last 168 hours. No results for input(s): AMMONIA in the last 168 hours.  ABG    Component Value Date/Time   PHART 7.425 03/08/2020 1400   PCO2ART 36.4 03/08/2020 1400   PO2ART 116 (H) 03/08/2020 1400   HCO3 23.4 03/08/2020 1400   TCO2 27 07/29/2020 0930   ACIDBASEDEF 0.4 03/08/2020 1400   O2SAT 98.4 03/08/2020 1400      Kara Mead MD. FCCP. [facility] Pager : [phone]  If no response to pager , please call [phone] until 7 pm After 7:00 pm call Elink  [phone]   04/15/2021

## 2021-04-15 NOTE — Telephone Encounter (Signed)
Patient has an appt with [place] on 05/16/21.

## 2021-04-15 NOTE — Progress Notes (Signed)
PROGRESS NOTE    [name], Dr.  DZH:[id number] DOB: [birth year] DOA: 04/09/2021 PCP: [name], MD   Brief Narrative:  The patient is an 80 year old Caucasian male with a past medical history significant for but not limited to complete heart block with pacemaker placement, atrial fibrillation on Tikosyn and Xarelto, hypertension, small cell lung cancer status post wedge resection status post chemotherapy and radiation who presented to the emergency room for evaluation of progressive worsening shortness of breath.  Patient recently completed radiation in July and is currently under observation with his oncologist but reports worsening shortness of breath and fatigue for over a little month.  He had been started on prednisone and did not notice much improvement but reported significant worsening when he began to decrease the dose.  He has gone back to 40 mg of prednisone daily but continued to be more dyspneic and has a cough with scant sputum production which is often pink-tinged.  He has had no fevers or chills, chest pain or leg swelling.  He was previously prescribed torsemide but reports that he not been taking it regularly since his legs were not swollen that because his weight has been fairly stable.  He did note a 2 pound weight gain but no swelling.  He was seen at an outside emergency department on 04/05/2021 for worsening dyspnea and was diagnosed with bilateral pneumonia and was started on doxycycline and supplemental oxygen.  He continues to worsen despite this and presented to [place] and found to be saturating in the mid 90s on 3 L of supplemental oxygen and is mildly tachypneic.  BNP was elevated 336 and CT of the chest was done which was negative for PE but was notable for groundglass opacities and interlobular septal thickening on background of fibrotic changes with increased bilateral pleural effusions concerning for pulmonary edema with reflux of contrast into hepatic  veins and possible infection.  Blood cultures obtained and he was admitted and treated with IV Lasix, cefepime and vancomycin and transferred to [facility] for further evaluation management.  After further discussion with the patient patient actually has supplemental oxygen at home and only wears 2 L as needed.  He has been admitted for acute on chronic hypoxic respiratory failure in setting of acute on chronic heart failure with preserved ejection fraction with bilateral pleural effusions and a history of swelling so wound cancer as well as concomitant pneumonia present on admission.  He underwent a thoracentesis during hospitalization. Yesterday morning he did not feel good given that he had a rough night.  PT OT to further evaluate and treat and recommending SNF. Will check an ECHOCardiogram given that he continues to cough and feel a little SOB remaining on O2.  BMP was repeated and is trending down and repeat echocardiogram done and showed normal EF of 60 to 65% and grade 1 diastolic CHF.  Patient adequately diuresed so he was changed to p.o. torsemide in the morning.  Pulmonary is consulted for further evaluation recommendations and they feel that his pulmonary infiltrates may be secondary to radiation pneumonitis.  They are recommending starting him on Solu-Medrol 40 mg every 6 hours for 1 to 2 days and then transition to prednisone and continue diuresis as tolerated.  We have now changed his torsemide to 20 mg every other day given that he appears euvolemic and slightly dry.  Pulmonary feels that the patient has ILD/radiation pneumonitis and has been recommended steroids and recommending IV Solu-Medrol every 8 hours with the  plan for discharge in 24 hours with 40 mg of prednisone as well as a prolonged course.  Assessment & Plan:   Principal Problem:   Acute respiratory failure with hypoxia ([name]) Active Problems:   Small cell lung cancer, right lower lobe ([name])   Persistent atrial fibrillation  ([name])   Acute on chronic diastolic CHF (congestive heart failure) ([name])   Bilateral pleural effusion   Thrombocytopenia ([name])   Thoracic ascending aortic aneurysm ([name])   Acute on chronic hypoxic respiratory failure in the setting of acute on chronic heart failure with preserved ejection fraction with associated bilateral pleural effusions and concomitant pneumonia/radiation pneumonitis in a patient with a history of SCLC and asthma; pulmonary now feels the patient has ILD in the setting of radiation pneumonitis/fibrosis -Patient has a history of heart failure with preserved ejection fraction at [facility] status postresection, chemotherapy and radiation and presented with progressive shortness of breath after being diagnosed with pneumonia on 04/05/2021 despite being started on supplemental oxygen and doxycycline.  He has now completed antibiotic course -CT of the chest was done and showed a large right and moderate left pleural effusions with groundglass opacities and septal thickening of the background fibrosis -BNP was elevated on admission at 338 and repeat was 131.5 -ECOHCARDIOGRAM was showing EF of 60-65% and showed G1DD -He is no chills or fevers or leukocytosis -Blood cultures were obtained in the ED and he was treated with IV Lasix, vancomycin and cefepime -IR has been consulted and patient underwent a thoracentesis with fluid analysis including cytology -IV diuresis has now stopped and he was changed to p.o. torsemide this morning and 40 mg will be changed to 20 mg every other day given that he appears dry for euvolemic -Antibiotics with IV Rocephin and IV Doxycycline now stopped -Prednisone was not resumed until COVID hit before yesterday and will resume at 40 mg Daily x5 Days and was going to taper however pulmonary was consulted and they are recommending IV Solu-Medrol 40 mg every 6h and had another change to every 8h for today commendations to transition to p.o. prednisone 40 mg p.o. daily at  discharge and following diuresis as tolerated chest x-ray -Strict I's and O's and daily weights -Patient is -12.425 L since admission -Procalcitonin level is less than 0.10 x2 however then trended up to 1.41 but improved to <0.10 and then trended up to 43.28 which is likely a spurious result as repeat this morning was less than 0.10 -We will continue supplemental oxygen via nasal cannula -SpO2: 93 % O2 Flow Rate (L/min): 2 L/min -Continue with benzonatate 200 mg p.o. 3 times daily as needed for cough as well as albuterol 3 mL inhalations every 6 as needed for wheezing or shortness of breath; will add Tussionex given his significant coughing today -Continuous pulse oximetry maintain O2 saturation greater 90% -Continue supplemental oxygen via nasal cannula and wean O2 as tolerated -Repeat CXR this AM showed "Stable chest radiograph findings. Minimal change in the bilateral parenchymal lung densities, right side greater than left. Findings likely represent acute or chronic disease. Suspect right pleural fluid based on previous CT findings. Stable appearance of the heart with a dual-chamber cardiac pacemaker and pericardial calcifications."  -Ambulatory home O2 screen and his saturations on room air were 82% and while ambulating were 87% on 4 L -Pulmonary was consulted and they feel that he has had radiation pneumonitis so started him back on high-dose IV steroids and recommending changing to po Prednisone 40 mg Daily   Paroxysmal  Atrial Fibrillation -Continue with Tikosyn and Xarelto as well as metoprolol succinate 12.5 mg p.o. daily -Continue telemetry monitoring -He has been in sinus rhythm since admission  Thrombocytopenia -Appears stable with no active bleeding -Patient's platelet count went from 110 -> 107 -> 105 -> 122 -> 107 -> 102 and is now 120 -> 128 -Continue to monitor for signs and symptoms bleeding repeat CBC in a.m.  Normocytic Anemia -Hemoglobin/Hct is stable at  10.8/33.3 -Check anemia panel in a.m. -Likely anemia of chronic disease -Continue monitor for signs and symptoms of bleeding; currently no overt bleeding noted -Repeat CBC in a.m.  Hyperbilirubinemia -Mild and likely reactive -Patient's T bili went from 2.8 and trended down to 2.0 but bumped up to 2.3 -> 2.1 -> 1.6 -> 1.7 -> 1.6 -Continue to monitor and trend and repeat CMP in a.m.  Hyponatremia -Likely in the setting of volume overload -Sodium has been ranging from 132-134 and this morning it was 133 -Continue with diuresis as above -Continue monitor and trend and repeat CMP in a.m.  GERD/GI prophylaxis -Continue with Esomeprazole substitution with pantoprazole 40 mg p.o. daily  Hyperlipidemia -Continue with Atorvastatin 20 once p.o. daily  BPH -Continue with Doxazosin 4 mg p.o. daily  Goals of Care; DNR, present on admission  DVT prophylaxis: Anticoagulated with Xarelto Code Status: DO NOT RESUSCITATE Family Communication: Discussed with wife at bedside  Disposition Plan: Anticipate D/C in the next 24-48 hours  Status is: Inpatient  Remains inpatient appropriate because:Unsafe d/c plan, IV treatments appropriate due to intensity of illness or inability to take PO, and Inpatient level of care appropriate due to severity of illness  Dispo: The patient is from: Home              Anticipated d/c is to: Home              Patient currently is not medically stable to d/c.   Difficult to place patient No  Consultants:  IR  Procedures:  Thoracentesis  Successful US guided right thoracentesis. Yielded 650 ml of amber-colored fluid. Pt tolerated procedure well. No immediate complications.  ECHOCARDIOGRAM IMPRESSIONS     1. Left ventricular ejection fraction, by estimation, is 60 to 65%. The  left ventricle has normal function. The left ventricle has no regional  wall motion abnormalities. Left ventricular diastolic parameters are  consistent with Grade I diastolic   dysfunction (impaired relaxation).   2. Right ventricular systolic function is normal. The right ventricular  size is normal. Tricuspid regurgitation signal is inadequate for assessing  PA pressure.   3. Left atrial size was moderately dilated.   4. Right atrial size was moderately dilated.   5. The mitral valve is normal in structure. No evidence of mitral valve  regurgitation. No evidence of mitral stenosis.   6. The aortic valve is tricuspid. Aortic valve regurgitation is not  visualized. No aortic stenosis is present.   7. Aortic dilatation noted. There is mild dilatation of the aortic root,  measuring 42 mm.   8. The inferior vena cava is dilated in size with >50% respiratory  variability, suggesting right atrial pressure of 8 mmHg.   FINDINGS   Left Ventricle: Left ventricular ejection fraction, by estimation, is 60  to 65%. The left ventricle has normal function. The left ventricle has no  regional wall motion abnormalities. The left ventricular internal cavity  size was normal in size. There is   no left ventricular hypertrophy. Left ventricular diastolic parameters  are consistent  with Grade I diastolic dysfunction (impaired relaxation).   Right Ventricle: The right ventricular size is normal. No increase in  right ventricular wall thickness. Right ventricular systolic function is  normal. Tricuspid regurgitation signal is inadequate for assessing PA  pressure. The tricuspid regurgitant  velocity is 2.52 m/s, and with an assumed right atrial pressure of 8 mmHg,  the estimated right ventricular systolic pressure is 38.1 mmHg.   Left Atrium: Left atrial size was moderately dilated.   Right Atrium: Right atrial size was moderately dilated.   Pericardium: There is no evidence of pericardial effusion.   Mitral Valve: The mitral valve is normal in structure. Mild mitral annular  calcification. No evidence of mitral valve regurgitation. No evidence of  mitral valve stenosis.    Tricuspid Valve: The tricuspid valve is normal in structure. Tricuspid  valve regurgitation is mild . No evidence of tricuspid stenosis.   Aortic Valve: The aortic valve is tricuspid. Aortic valve regurgitation is  not visualized. No aortic stenosis is present. Aortic valve mean gradient  measures 3.0 mmHg. Aortic valve peak gradient measures 5.0 mmHg. Aortic  valve area, by VTI measures 1.52  cm.   Pulmonic Valve: The pulmonic valve was normal in structure. Pulmonic valve  regurgitation is not visualized. No evidence of pulmonic stenosis.   Aorta: Aortic dilatation noted. There is mild dilatation of the aortic  root, measuring 42 mm.   Venous: The inferior vena cava is dilated in size with greater than 50%  respiratory variability, suggesting right atrial pressure of 8 mmHg.   IAS/Shunts: There is left bowing of the interatrial septum, suggestive of  elevated right atrial pressure. The interatrial septum was not well  visualized.   Additional Comments: A device lead is visualized.      LEFT VENTRICLE  PLAX 2D  LVIDd:         3.50 cm     Diastology  LVIDs:         2.30 cm     LV e' medial:    6.20 cm/s  LV PW:         1.00 cm     LV E/e' medial:  9.3  LV IVS:        1.60 cm     LV e' lateral:   8.05 cm/s  LVOT diam:     1.90 cm     LV E/e' lateral: 7.2  LV SV:         39  LV SV Index:   21  LVOT Area:     2.84 cm     LV Volumes (MOD)  LV vol d, MOD A4C: 40.5 ml  LV vol s, MOD A4C: 13.4 ml  LV SV MOD A4C:     40.5 ml   RIGHT VENTRICLE            IVC  RV S prime:     9.03 cm/s  IVC diam: 2.70 cm  TAPSE (M-mode): 1.4 cm   LEFT ATRIUM           Index       RIGHT ATRIUM           Index  LA diam:      3.80 cm 2.05 cm/m  RA Area:     17.00 cm  LA Vol (A2C): 50.1 ml 27.04 ml/m RA Volume:   40.20 ml  21.70 ml/m  LA Vol (A4C): 50.7 ml 27.37 ml/m   AORTIC VALVE  AV Area (Vmax):  1.70 cm  AV Area (Vmean):   1.69 cm  AV Area (VTI):     1.52 cm  AV Vmax:            112.00 cm/s  AV Vmean:          84.300 cm/s  AV VTI:            0.255 m  AV Peak Grad:      5.0 mmHg  AV Mean Grad:      3.0 mmHg  LVOT Vmax:         67.20 cm/s  LVOT Vmean:        50.300 cm/s  LVOT VTI:          0.137 m  LVOT/AV VTI ratio: 0.54     AORTA  Ao Root diam: 3.70 cm  Ao Asc diam:  4.20 cm   MITRAL VALVE               TRICUSPID VALVE  MV Area (PHT): 3.44 cm    TR Peak grad:   25.4 mmHg  MV E velocity: 57.80 cm/s  TR Vmax:        252.00 cm/s  MV A velocity: 53.10 cm/s  MV E/A ratio:  1.09        SHUNTS                             Systemic VTI:  0.14 m                             Systemic Diam: 1.90 cm  Antimicrobials:  Anti-infectives (From admission, onward)    Start     Dose/Rate Route Frequency Ordered Stop   04/10/21 0900  vancomycin (VANCOREADY) IVPB 1500 mg/300 mL  Status:  Discontinued        1,500 mg 150 mL/hr over 120 Minutes Intravenous Every 24 hours 04/09/21 1739 04/09/21 2122   04/10/21 0600  vancomycin (VANCOREADY) IVPB 1500 mg/300 mL  Status:  Discontinued        1,500 mg 150 mL/hr over 120 Minutes Intravenous Every 24 hours 04/09/21 1402 04/09/21 1739   04/09/21 2300  cefTRIAXone (ROCEPHIN) 2 g in sodium chloride 0.9 % 100 mL IVPB        2 g 200 mL/hr over 30 Minutes Intravenous Every 24 hours 04/09/21 2122 04/14/21 2159   04/09/21 2300  doxycycline (VIBRAMYCIN) 100 mg in sodium chloride 0.9 % 250 mL IVPB  Status:  Discontinued        100 mg 125 mL/hr over 120 Minutes Intravenous Every 12 hours 04/09/21 2154 04/14/21 1029   04/09/21 2215  azithromycin (ZITHROMAX) 500 mg in sodium chloride 0.9 % 250 mL IVPB  Status:  Discontinued        500 mg 250 mL/hr over 60 Minutes Intravenous Every 24 hours 04/09/21 2122 04/09/21 2154   04/09/21 1415  vancomycin (VANCOCIN) IVPB 1000 mg/200 mL premix        1,000 mg 200 mL/hr over 60 Minutes Intravenous  Once 04/09/21 1402 04/09/21 1718   04/09/21 1400  ceFEPIme (MAXIPIME) 2 g in sodium chloride 0.9 % 100 mL  IVPB        2 g 200 mL/hr over 30 Minutes Intravenous  Once 04/09/21 1347 04/09/21 1509       Subjective: Seen and examined at bedside and feeling fatigued.  Pulmonary spoke to him  at length about ILD.  No chest pain or shortness of breath but continues to have a cough still and states he only coughs when he talks.  Feels like he got too much diuretics. Denies any concerns or complaints at this time and anticipating discharging to SNF next 24 hours given that pulmonary recommends continuing IV Solu-Medrol today.  Objective: Vitals:   04/14/21 1348 04/14/21 2024 04/15/21 0358 04/15/21 1412  BP: (!) 110/56 122/73 104/69 112/66  Pulse: 68 70 63 73  Resp: 16 16 11 16   Temp: 97.8 F (36.6 C) 97.8 F (36.6 C) 97.6 F (36.4 C) 98.1 F (36.7 C)  TempSrc: Oral Oral Oral Oral  SpO2: 94% 95% 95% 93%  Weight:      Height:        Intake/Output Summary (Last 24 hours) at 04/15/2021 1611 Last data filed at 04/15/2021 0423 Gross per 24 hour  Intake --  Output 2180 ml  Net -2180 ml    Filed Weights   04/09/21 1035 04/11/21 0327 04/13/21 0300  Weight: 68.9 kg 68.9 kg 70.2 kg   Examination: Physical Exam:  Constitutional: The patient is a thin elderly chronically ill-appearing Caucasian male and slight respiratory distress appears uncomfortable and appears fatigued still Eyes: Lids and conjunctivae normal, sclerae anicteric  ENMT: External Ears, Nose appear normal. Grossly normal hearing.  Neck: Appears normal, supple, no cervical masses, normal ROM, no appreciable thyromegaly; no JVD Respiratory: Diminished to auscultation bilaterally with coarse breath sounds and some slight crackles and some rhonchi noted, no wheezing, rales, rhonchi or crackles. Normal respiratory effort and patient is not tachypenic. No accessory muscle use.  Wearing supplemental oxygen via nasal cannula Cardiovascular: RRR, no murmurs / rubs / gallops. S1 and S2 auscultated. No extremity edema.  Abdomen: Soft,  non-tender, non-distended. Bowel sounds positive.  GU: Deferred. Musculoskeletal: No clubbing / cyanosis of digits/nails. No joint deformity upper and lower extremities. Skin: No rashes, lesions, ulcers. No induration; Warm and dry.  Neurologic: CN 2-12 grossly intact with no focal deficits. Romberg sign and cerebellar reflexes not assessed.  Psychiatric: Normal judgment and insight. Alert and oriented x 3. Normal mood and appropriate affect.   Data Reviewed: I have personally reviewed following labs and imaging studies  CBC: Recent Labs  Lab 04/11/21 0139 04/12/21 0146 04/13/21 0331 04/14/21 0152 04/15/21 0946  WBC 6.8 6.5 5.4 5.4 6.1  NEUTROABS 5.7 5.4 4.3 4.6 5.8  HGB 10.3* 10.6* 10.6* 10.0* 10.8*  HCT 31.9* 32.6* 32.0* 31.1* 33.3*  MCV 85.5 85.3 85.1 85.2 85.6  PLT 122* 107* 102* 120* 128*    Basic Metabolic Panel: Recent Labs  Lab 04/11/21 0139 04/12/21 0146 04/13/21 0331 04/14/21 0152 04/15/21 0946  NA 132* 132* 133* 132* 133*  K 3.8 4.0 4.0 3.9 4.5  CL 99 100 102 101 101  CO2 26 25 26 25 24   GLUCOSE 100* 94 99 124* 167*  BUN 18 18 15 18 21   CREATININE 0.82 0.76 0.81 0.81 0.76  CALCIUM 9.7 9.4 9.4 9.5 10.2  MG 1.9 1.8 1.8 1.8 2.1  PHOS 2.8 2.7 2.4* 2.4* 2.9    GFR: Estimated Creatinine Clearance: 70.7 mL/min (by C-G formula based on SCr of 0.76 mg/dL). Liver Function Tests: Recent Labs  Lab 04/11/21 0139 04/12/21 0146 04/13/21 0331 04/14/21 0152 04/15/21 0946  AST 17 29 19 21 29   ALT 23 26 22 25  35  ALKPHOS 325* 373* 392* 424* 465*  BILITOT 2.3* 2.1* 1.6* 1.7* 1.6*  PROT 5.5* 5.3* 5.3*  5.6* 5.9*  ALBUMIN 2.7* 2.6* 2.4* 2.5* 2.8*    No results for input(s): LIPASE, AMYLASE in the last 168 hours. No results for input(s): AMMONIA in the last 168 hours. Coagulation Profile: No results for input(s): INR, PROTIME in the last 168 hours. Cardiac Enzymes: No results for input(s): CKTOTAL, CKMB, CKMBINDEX, TROPONINI in the last 168 hours. BNP (last 3  results) Recent Labs    10/03/20 1340  PROBNP 471    HbA1C: No results for input(s): HGBA1C in the last 72 hours. CBG: No results for input(s): GLUCAP in the last 168 hours. Lipid Profile: No results for input(s): CHOL, HDL, LDLCALC, TRIG, CHOLHDL, LDLDIRECT in the last 72 hours. Thyroid Function Tests: No results for input(s): TSH, T4TOTAL, FREET4, T3FREE, THYROIDAB in the last 72 hours. Anemia Panel: No results for input(s): VITAMINB12, FOLATE, FERRITIN, TIBC, IRON, RETICCTPCT in the last 72 hours. Sepsis Labs: Recent Labs  Lab 04/13/21 0331 04/14/21 0152 04/14/21 0857 04/15/21 0946  PROCALCITON <0.10 43.28 <0.10 <0.10     Recent Results (from the past 240 hour(s))  Resp Panel by RT-PCR (Flu A&B, Covid) Nasopharyngeal Swab     Status: None   Collection Time: 04/09/21 11:47 AM   Specimen: Nasopharyngeal Swab; Nasopharyngeal(NP) swabs in vial transport medium  Result Value Ref Range Status   SARS Coronavirus 2 by RT PCR NEGATIVE NEGATIVE Final    Comment: (NOTE) SARS-CoV-2 target nucleic acids are NOT DETECTED.  The SARS-CoV-2 RNA is generally detectable in upper respiratory specimens during the acute phase of infection. The lowest concentration of SARS-CoV-2 viral copies this assay can detect is 138 copies/mL. A negative result does not preclude SARS-Cov-2 infection and should not be used as the sole basis for treatment or other patient management decisions. A negative result may occur with  improper specimen collection/handling, submission of specimen other than nasopharyngeal swab, presence of viral mutation(s) within the areas targeted by this assay, and inadequate number of viral copies(<138 copies/mL). A negative result must be combined with clinical observations, patient history, and epidemiological information. The expected result is Negative.  Fact Sheet for Patients:  EntrepreneurPulse.com.au  Fact Sheet for Healthcare Providers:   IncredibleEmployment.be  This test is no t yet approved or cleared by the [place] FDA and  has been authorized for detection and/or diagnosis of SARS-CoV-2 by FDA under an Emergency Use Authorization (EUA). This EUA will remain  in effect (meaning this test can be used) for the duration of the COVID-19 declaration under Section 564(b)(1) of the Act, 21 U.S.C.section 360bbb-3(b)(1), unless the authorization is terminated  or revoked sooner.       Influenza A by PCR NEGATIVE NEGATIVE Final   Influenza B by PCR NEGATIVE NEGATIVE Final    Comment: (NOTE) The Xpert Xpress SARS-CoV-2/FLU/RSV plus assay is intended as an aid in the diagnosis of influenza from Nasopharyngeal swab specimens and should not be used as a sole basis for treatment. Nasal washings and aspirates are unacceptable for Xpert Xpress SARS-CoV-2/FLU/RSV testing.  Fact Sheet for Patients: EntrepreneurPulse.com.au  Fact Sheet for Healthcare Providers: IncredibleEmployment.be  This test is not yet approved or cleared by the [place] FDA and has been authorized for detection and/or diagnosis of SARS-CoV-2 by FDA under an Emergency Use Authorization (EUA). This EUA will remain in effect (meaning this test can be used) for the duration of the COVID-19 declaration under Section 564(b)(1) of the Act, 21 U.S.C. section 360bbb-3(b)(1), unless the authorization is terminated or revoked.  Performed at [facility],  [address], [place], [place] [address]   Culture, blood (Routine X 2) w Reflex to ID Panel     Status: None   Collection Time: 04/09/21  2:25 PM   Specimen: BLOOD  Result Value Ref Range Status   Specimen Description BLOOD LEFT ANTECUBITAL  Final   Special Requests   Final    BOTTLES DRAWN AEROBIC AND ANAEROBIC Blood Culture adequate volume   Culture   Final    NO GROWTH 5 DAYS Performed at [facility], [place] [address], [place], [place] [address]    Report Status 04/14/2021 FINAL  Final  Culture, blood (Routine X 2) w Reflex to ID Panel     Status: None   Collection Time: 04/09/21  2:25 PM   Specimen: BLOOD  Result Value Ref Range Status   Specimen Description BLOOD RIGHT ANTECUBITAL  Final   Special Requests   Final    BOTTLES DRAWN AEROBIC AND ANAEROBIC Blood Culture adequate volume   Culture   Final    NO GROWTH 5 DAYS Performed at [facility], [place] [address], [place], [place] [address]    Report Status 04/14/2021 FINAL  Final  Gram stain     Status: None   Collection Time: 04/10/21 11:04 AM   Specimen: Lung, Right; Pleural Fluid  Result Value Ref Range Status   Specimen Description FLUID RIGHT PLEURAL  Final   Special Requests NONE  Final   Gram Stain   Final    MODERATE WBC PRESENT,BOTH PMN AND MONONUCLEAR NO ORGANISMS SEEN Performed at [facility], [place] [address] [place], [place] [address]    Report Status 04/10/2021 FINAL  Final  Culture, body fluid w Gram Stain-bottle     Status: None   Collection Time: 04/10/21 11:04 AM   Specimen: Fluid  Result Value Ref Range Status   Specimen Description FLUID RIGHT PLEURAL  Final   Special Requests BOTTLES DRAWN AEROBIC AND ANAEROBIC 10CC  Final   Culture   Final    NO GROWTH 5 DAYS Performed at [facility], [place] [address], [place], [place] [address]    Report Status 04/15/2021 FINAL  Final     RN Pressure Injury Documentation:     Estimated body mass index is 22.85 kg/m as calculated from the following:   Height as of this encounter: 5\' 9"  (1.753 m).   Weight as of this encounter: 70.2 kg.  Malnutrition Type:   Malnutrition Characteristics:   Nutrition Interventions:     Radiology Studies: DG CHEST PORT 1 VIEW  Result Date: 04/15/2021 CLINICAL DATA:  Shortness of breath. EXAM: PORTABLE CHEST 1 VIEW COMPARISON:  04/13/2021 and 08/24/2020 FINDINGS: Stable appearance of dual-chamber cardiac pacemaker.  Elevation of the right hemidiaphragm. Scattered parenchymal lung densities, right side greater than left, and minimally changed. Heart size is stable. Evidence of pericardial calcifications. Stable pleural-based densities in the lateral right chest. IMPRESSION: 1. Stable chest radiograph findings. 2. Minimal change in the bilateral parenchymal lung densities, right side greater than left. Findings likely represent acute or chronic disease. Suspect right pleural fluid based on previous CT findings. 3. Stable appearance of the heart with a dual-chamber cardiac pacemaker and pericardial calcifications. Electronically Signed   By: Markus Daft M.D.   On: 04/15/2021 10:55    Scheduled Meds:  atorvastatin  20 mg Oral Daily   dofetilide  250 mcg Oral BID   doxazosin  4 mg Oral Daily   memantine  10  mg Oral BID   methylPREDNISolone (SOLU-MEDROL) injection  40 mg Intravenous Q8H   metoprolol succinate  12.5 mg Oral QHS   montelukast  10 mg Oral BH-q7a   pantoprazole  40 mg Oral Daily   potassium chloride  40 mEq Oral Daily   rivaroxaban  20 mg Oral Q supper   [START ON 04/17/2021] torsemide  20 mg Oral QODAY   Continuous Infusions:    LOS: 6 days   Kerney Elbe, DO [facility] PAGER is on AMION  If 7PM-7AM, please contact night-coverage [email or web address]

## 2021-04-16 ENCOUNTER — Inpatient Hospital Stay (HOSPITAL_COMMUNITY): Payer: Medicare Other

## 2021-04-16 LAB — COMPREHENSIVE METABOLIC PANEL
ALT: 47 U/L — ABNORMAL HIGH (ref 0–44)
AST: 41 U/L (ref 15–41)
Albumin: 2.7 g/dL — ABNORMAL LOW (ref 3.5–5.0)
Alkaline Phosphatase: 440 U/L — ABNORMAL HIGH (ref 38–126)
Anion gap: 10 (ref 5–15)
BUN: 31 mg/dL — ABNORMAL HIGH (ref 8–23)
CO2: 28 mmol/L (ref 22–32)
Calcium: 10.1 mg/dL (ref 8.9–10.3)
Chloride: 96 mmol/L — ABNORMAL LOW (ref 98–111)
Creatinine, Ser: 1.05 mg/dL (ref 0.61–1.24)
GFR, Estimated: >60 mL/min (ref >60)
Glucose, Bld: 142 mg/dL — ABNORMAL HIGH (ref 70–99)
Potassium: 4.1 mmol/L (ref 3.5–5.1)
Sodium: 134 mmol/L — ABNORMAL LOW (ref 135–145)
Total Bilirubin: 1.6 mg/dL — ABNORMAL HIGH (ref 0.3–1.2)
Total Protein: 5.8 g/dL — ABNORMAL LOW (ref 6.5–8.1)

## 2021-04-16 LAB — CBC WITH DIFFERENTIAL/PLATELET
Abs Immature Granulocytes: 0.03 10*3/uL (ref 0.00–0.07)
Basophils Absolute: 0 10*3/uL (ref 0.0–0.1)
Basophils Relative: 0 %
Eosinophils Absolute: 0 10*3/uL (ref 0.0–0.5)
Eosinophils Relative: 0 %
HCT: 32.2 % — ABNORMAL LOW (ref 39.0–52.0)
Hemoglobin: 10.4 g/dL — ABNORMAL LOW (ref 13.0–17.0)
Immature Granulocytes: 0 %
Lymphocytes Relative: 2 %
Lymphs Abs: 0.2 10*3/uL — ABNORMAL LOW (ref 0.7–4.0)
MCH: 27.6 pg (ref 26.0–34.0)
MCHC: 32.3 g/dL (ref 30.0–36.0)
MCV: 85.4 fL (ref 80.0–100.0)
Monocytes Absolute: 0.4 10*3/uL (ref 0.1–1.0)
Monocytes Relative: 4 %
Neutro Abs: 7.9 10*3/uL — ABNORMAL HIGH (ref 1.7–7.7)
Neutrophils Relative %: 94 %
Platelets: 157 10*3/uL (ref 150–400)
RBC: 3.77 MIL/uL — ABNORMAL LOW (ref 4.22–5.81)
RDW: 17.5 % — ABNORMAL HIGH (ref 11.5–15.5)
WBC: 8.5 10*3/uL (ref 4.0–10.5)
nRBC: 0 % (ref 0.0–0.2)

## 2021-04-16 LAB — RESP PANEL BY RT-PCR (FLU A&B, COVID) ARPGX2
Influenza A by PCR: NEGATIVE
Influenza B by PCR: NEGATIVE
SARS Coronavirus 2 by RT PCR: NEGATIVE

## 2021-04-16 LAB — PROCALCITONIN: Procalcitonin: <0.1 ng/mL

## 2021-04-16 LAB — PHOSPHORUS: Phosphorus: 3.3 mg/dL (ref 2.5–4.6)

## 2021-04-16 LAB — MAGNESIUM: Magnesium: 2.1 mg/dL (ref 1.7–2.4)

## 2021-04-16 IMAGING — DX DG CHEST 1V PORT
1 series · 1 of 1 positions shown · non-contrast
Comparison: [DATE].

CLINICAL DATA: Dyspnea.

EXAM:
PORTABLE CHEST 1 VIEW

[chest]
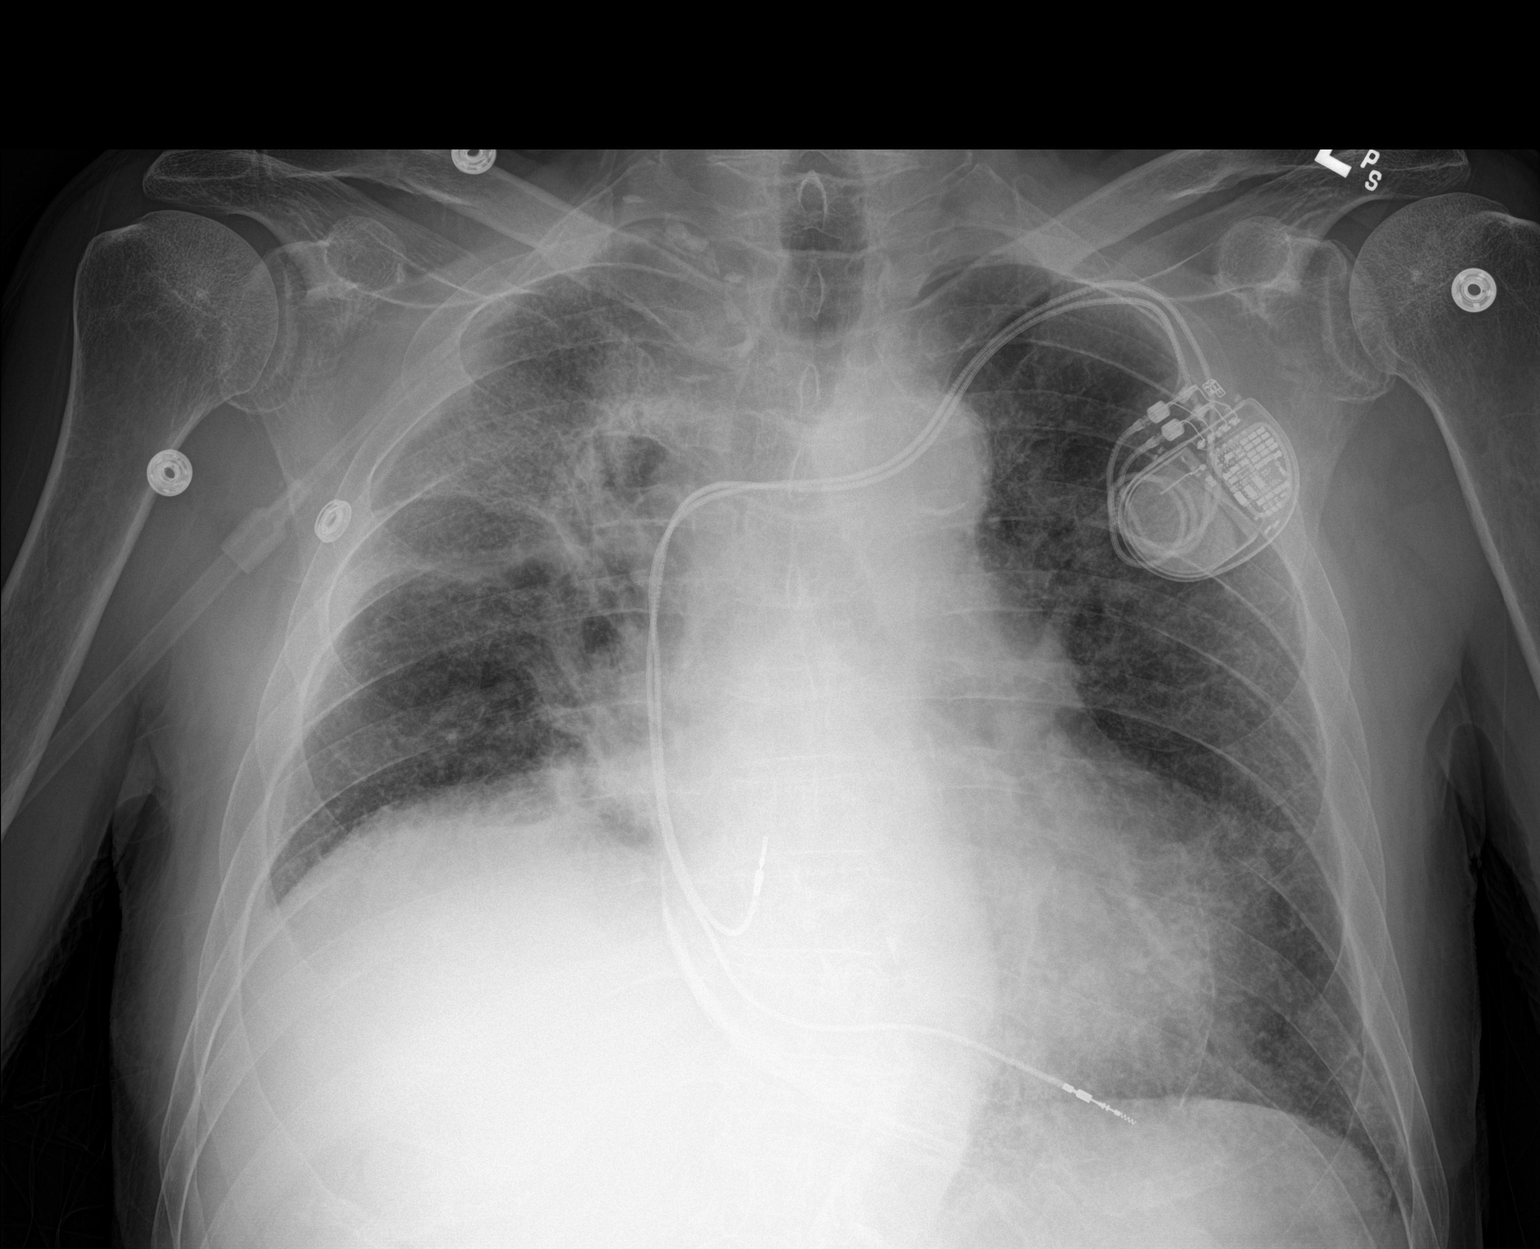

[1 of 1 positions shown; findings below may reference images not displayed]

FINDINGS: Stable cardiomegaly. Left-sided pacemaker is unchanged in position.
No pneumothorax is noted. Stable right upper lobe airspace opacity
is noted concerning for pneumonia. Minimal bibasilar subsegmental
atelectasis or scarring is noted. Stable elevated right
hemidiaphragm. Bony thorax unremarkable.
IMPRESSION: Stable right upper lobe airspace opacity is noted concerning for
pneumonia. Minimal bibasilar subsegmental atelectasis.

## 2021-04-16 MED ORDER — PREDNISONE 10 MG PO TABS: 40.0000 mg | ORAL_TABLET | Freq: Every day | ORAL | 0 refills | Status: AC

## 2021-04-16 MED ORDER — TORSEMIDE 20 MG PO TABS: 20.0000 mg | ORAL_TABLET | ORAL | 0 refills | Status: DC

## 2021-04-16 MED ORDER — SENNOSIDES-DOCUSATE SODIUM 8.6-50 MG PO TABS: 1.0000 | ORAL_TABLET | Freq: Every evening | ORAL | Status: AC | PRN

## 2021-04-16 MED ORDER — POTASSIUM CHLORIDE CRYS ER 20 MEQ PO TBCR: 40.0000 meq | EXTENDED_RELEASE_TABLET | ORAL | 0 refills | Status: DC

## 2021-04-16 MED ORDER — TRAZODONE HCL 50 MG PO TABS: 50.0000 mg | ORAL_TABLET | Freq: Every evening | ORAL | 0 refills | Status: AC | PRN

## 2021-04-16 NOTE — Discharge Summary (Addendum)
Physician Discharge Summary  [name], Dr. OVF:[id number] DOB: 08/27/1940 DOA: 04/09/2021  PCP: [name], MD  Admit date: 04/09/2021 Discharge date: 04/16/2021  Admitted From: [facility]  Disposition: [facility]    Recommendations for Outpatient Follow-up:  Follow up with PCP in 1-2 weeks Follow-up with pulmonology, Dr. Silas Flood in 2 weeks Continue prednisone 40 mg p.o. 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  Patient completed radiation in July and currently under observation with his oncologist but reports worsening shortness of breath and fatigue for a little over a month.  He had been started on prednisone, did not notice much improvement, but reports significant worsening when he began to decrease the dose.  He has since gone back to 40 mg prednisone daily but continued to become more dyspneic, has cough with scant sputum production, often pink-tinged, but no fevers, chills, chest pain, or leg swelling.  He is prescribed torsemide but reports that he has not been taking it regularly since his legs have not been swollen and his weight has been fairly stable.  He does note a recent 2 pound weight gain but no swelling.  He was seen at an outside emergency department on 04/05/2021 for worsening dyspnea, was diagnosed with bilateral pneumonia, and was started on doxycycline and supplemental oxygen.  He continued to worsen despite this.  Upon arrival to the ED, patient is found to be afebrile, saturating mid 90s on 3 L/min of supplemental  oxygen, mildly tachypneic, and with stable blood pressure.  EKG features paced rhythm.  Chemistry panel with chronic elevations in alkaline phosphatase and total bilirubin.  CBC with stable anemia and thrombocytopenia.  BNP elevated to 336.  CTA chest negative for PE but notable for groundglass opacities and interlobular septal thickening on a background of fibrotic changes as well as increased bilateral pleural effusions, concern for pulmonary edema, reflux of contrast into the hepatic veins, and possible infection.  Blood cultures were collected in the emergency department, patient was treated with IV Lasix, cefepime, and vancomycin, and transferred to [facility] for ongoing evaluation and management.   Hospital course:  Acute hypoxic respiratory failure, PO Interstitial lung disease 2/2 radiation pneumonitis/fibrosis Patient presenting to ED with progressive shortness of breath now oxygen dependent.  CT chest with large right and moderate left pleural effusions with groundglass opacities and septal thickening in the background of fibrosis.  Patient was initially started on empiric antibiotics with vancomycin and cefepime followed by doxycycline and ceftriaxone which she completed full antibiotic course while inpatient; although respiratory failure likely secondary to fibrosis as well as diastolic congestive heart failure exacerbation with bilateral pleural effusions.  Pulmonology was consulted and followed during hospital course.  Patient was started on IV Solu-Medrol with improvement of symptoms and will de-escalate to prednisone 40 mg p.o. daily at time of discharge.  Patient will continue prednisone 40 mg p.o. daily until follows up with pulmonology in 2 weeks, Dr. Silas Flood for plan slow taper.  Continue Singulair.  Continue supple oxygen, 3 L per nasal cannula.  Acute on chronic diastolic congestive heart failure Bilateral pleural effusions BNP elevated on admission 338 with  large right  and moderate left pleural effusions noted on CT chest.  TTE with LVEF 60-65% with grade 1 diastolic dysfunction and IVC dilated.  Interventional radiology was consulted and patient underwent right thoracentesis with 650 mL of pleural fluid removed.  Pleural fluid analysis consistent with a transudative effusion.  Patient was initially started on IV diuresis and transition back to his home torsemide 40 mg p.o. daily.  Continue metoprolol succinate 12.5 mg p.o. nightly.  Recommend continued monitoring of daily weights.  History of small cell lung cancer Previously underwent wedge resection and completed chemotherapy/radiation.  Permanent atrial fibrillation Continue Tikosyn 250 mg p.o. twice daily.  And Xarelto.  Outpatient follow-up with cardiology with annual imaging follow-up by CTA or MRA.Marland Kitchen  Ascending thoracic aorta dilation CTA chest with ectasia ascending thoracic aorta measuring 4.2 cm.  TTE with mild dilation aortic root measuring 42 mm.  Outpatient follow-up with cardiology.  GERD: Continue Nexium 40 mg p.o. daily  HLD: Atorvastatin 20 mg p.o. daily.  BPH: Doxazosin 4 mg p.o. daily  Discharge Diagnoses:  Principal Problem:   Acute respiratory failure with hypoxia ([name]) Active Problems:   Small cell lung cancer, right lower lobe ([name])   Persistent atrial fibrillation ([name])   Acute on chronic diastolic CHF (congestive heart failure) ([name])   Bilateral pleural effusion   Thrombocytopenia ([name])   Thoracic ascending aortic aneurysm [facility])    Discharge Instructions  Discharge Instructions     Call MD for:  difficulty breathing, headache or visual disturbances   Complete by: As directed    Call MD for:  extreme fatigue   Complete by: As directed    Call MD for:  persistant dizziness or light-headedness   Complete by: As directed    Call MD for:  persistant nausea and vomiting   Complete by: As directed    Call MD for:  severe uncontrolled pain   Complete by: As directed     Call MD for:  temperature >100.4   Complete by: As directed    Diet - low sodium heart healthy   Complete by: As directed    Increase activity slowly   Complete by: As directed       Allergies as of 04/16/2021       Reactions   Lisinopril Palpitations, Other (See Comments)   Dizziness, also        Medication List     TAKE these medications    acetaminophen 500 MG tablet Commonly known as: TYLENOL Take 500-1,000 mg by mouth every 6 (six) hours as needed for moderate pain.   albuterol 108 (90 Base) MCG/ACT inhaler Commonly known as: VENTOLIN HFA Inhale 1 puff into the lungs every 6 (six) hours as needed for wheezing or shortness of breath.   atorvastatin 20 MG tablet Commonly known as: LIPITOR TAKE 1 TABLET(20 MG) BY MOUTH DAILY   benzonatate 200 MG capsule Commonly known as: TESSALON Take 1 capsule (200 mg total) by mouth 3 (three) times daily as needed for cough.   dofetilide 250 MCG capsule Commonly known as: TIKOSYN Take 1 capsule (250 mcg total) by mouth 2 (two) times daily.   doxazosin 4 MG tablet Commonly known as: CARDURA Take 4 mg by mouth daily.   esomeprazole 20 MG capsule Commonly known as: NEXIUM Take 20 mg by mouth daily before breakfast.   memantine 10 MG tablet Commonly known as: Namenda Take 1 tablet (10 mg total) by mouth 2 (two) times daily.   metoprolol succinate 25 MG  24 hr tablet Commonly known as: TOPROL-XL Take 0.5 tablets (12.5 mg total) by mouth at bedtime.   montelukast 10 MG tablet Commonly known as: SINGULAIR Take 10 mg by mouth every morning.   Pfizer-BioNT COVID-19 Vac-TriS Susp injection Generic drug: COVID-19 mRNA Vac-TriS (Pfizer) Inject into the muscle.   potassium chloride SA 20 MEQ tablet Commonly known as: KLOR-CON Take 2 tablets (40 mEq total) by mouth every other day. Take with torsemide What changed:  how much to take how to take this when to take this additional instructions   predniSONE 10 MG  tablet Commonly known as: DELTASONE Take 4 tablets (40 mg total) by mouth daily. What changed:  how much to take how to take this when to take this additional instructions   PRESERVISION AREDS 2 PO Take 1 capsule by mouth in the morning and at bedtime.   rivaroxaban 20 MG Tabs tablet Commonly known as: XARELTO Take 1 tablet (20 mg total) by mouth daily with supper.   senna-docusate 8.6-50 MG tablet Commonly known as: Senokot-S Take 1 tablet by mouth at bedtime as needed for mild constipation.   torsemide 20 MG tablet Commonly known as: DEMADEX Take 1 tablet (20 mg total) by mouth every other day. Start taking on: April 17, 2021 What changed:  how much to take how to take this when to take this additional instructions   traZODone 50 MG tablet Commonly known as: DESYREL Take 1 tablet (50 mg total) by mouth at bedtime as needed for sleep.   vitamin C 500 MG tablet Commonly known as: ASCORBIC ACID Take 500 mg by mouth at bedtime.        Follow-up Information     [name], MD. Schedule an appointment as soon as possible for a visit in 1 week(s).   Specialty: Internal Medicine Contact information: [place] [address] [phone]         Deboraha Sprang, MD .   Specialty: Cardiology Contact information: [phone] [address] [place] [address] [phone]         Hunsucker, Bonna Gains, MD. Schedule an appointment as soon as possible for a visit in 2 week(s).   Specialty: Pulmonary Disease Contact information: [address] [place] [address] [phone]                Allergies  Allergen Reactions   Lisinopril Palpitations and Other (See Comments)    Dizziness, also    Consultations: Pulmonology   Procedures/Studies: DG Chest 1 View  Result Date: 04/10/2021 CLINICAL DATA:  Status right post thoracentesis EXAM: CHEST  1 VIEW COMPARISON:  Multiple priors FINDINGS:  There is a 2 lead implantable cardiac device with leads terminating in the right atrium and right ventricle. Again seen are pericardial calcifications. Improved effusion on the right side. No pneumothorax. There remains bilateral interstitial and pulmonary opacities as seen on recent cross-sectional CT of the chest. No acute osseous abnormality. IMPRESSION: Improved right-sided effusion after right-sided thoracentesis. No pneumothorax. Similar appearance of bilateral pulmonary opacities. Electronically Signed   By: Albin Felling M.D.   On: 04/10/2021 11:25   CT Angio Chest PE W/Cm &/Or Wo Cm  Result Date: 04/09/2021 CLINICAL DATA:  PE suspected, shortness of breath EXAM: CT ANGIOGRAPHY CHEST WITH CONTRAST TECHNIQUE: Multidetector CT imaging of the chest was performed using the standard protocol during bolus administration of intravenous contrast. Multiplanar CT image reconstructions and MIPs were obtained to evaluate the vascular anatomy. CONTRAST:  75mL OMNIPAQUE IOHEXOL 350 MG/ML SOLN COMPARISON:  03/10/2021 CT chest FINDINGS: Cardiovascular: Satisfactory opacification of the pulmonary arteries to the segmental level. No evidence of pulmonary embolism. The heart is normal in size. Redemonstrated extensive pericardial calcifications. Aortic atherosclerosis, extending into the aortic arch branch vessels. Three-vessel coronary artery calcifications. Redemonstrated aortic ectasia, measuring up to 4.2 cm. Left chest pacemaker with leads in the right atrium and right ventricle. Reflux of contrast into the IVC and hepatic veins. Mediastinum/Nodes: No enlarged mediastinal, hilar, or axillary lymph nodes. Thyroid gland, trachea, and esophagus demonstrate no significant findings. Lungs/Pleura: Postoperative changes of wedge resection of the superior segment of the right lower lobe. Increased areas of ground-glass attenuation and septal thickening superimposed on previously noted fibrotic changes. Increased right pleural  effusion, large, and left pleural effusion now moderate. No pneumothorax. Upper Abdomen: No acute abnormality. Musculoskeletal: No chest wall abnormality. No acute or significant osseous findings. Redemonstrated compression deformity of T11. Review of the MIP images confirms the above findings. IMPRESSION: 1. Increased ground-glass opacities and interlobular septal thickening, superimposed on background fibrotic changes, with increased bilateral pleural effusions, concerning for pulmonary edema, although superimposed infection can not be excluded. 2. Reflux of contrast into the hepatic veins, which can be seen in the setting of heart failure. Correlate with BNP. 3. Negative for pulmonary embolism to the level of the segmental arteries. 4. Ectasia of the ascending thoracic aorta, measuring to 4.2 cm in diameter. Recommend annual imaging followup by CTA or MRA. This recommendation follows 2010 ACCF/AHA/AATS/ACR/ASA/SCA/SCAI/SIR/STS/SVM Guidelines for the Diagnosis and Management of Patients with Thoracic Aortic Disease. Circulation. 2010; 121: J009-F818. Aortic aneurysm NOS (ICD10-I71.9) 5.  Aortic Atherosclerosis (ICD10-I70.0). Electronically Signed   By: Merilyn Baba M.D.   On: 04/09/2021 13:37   DG CHEST PORT 1 VIEW  Result Date: 04/16/2021 CLINICAL DATA:  Dyspnea. EXAM: PORTABLE CHEST 1 VIEW COMPARISON:  April 15, 2021. FINDINGS: Stable cardiomegaly. Left-sided pacemaker is unchanged in position. No pneumothorax is noted. Stable right upper lobe airspace opacity is noted concerning for pneumonia. Minimal bibasilar subsegmental atelectasis or scarring is noted. Stable elevated right hemidiaphragm. Bony thorax unremarkable. IMPRESSION: Stable right upper lobe airspace opacity is noted concerning for pneumonia. Minimal bibasilar subsegmental atelectasis. Electronically Signed   By: Marijo Conception M.D.   On: 04/16/2021 08:28   DG CHEST PORT 1 VIEW  Result Date: 04/15/2021 CLINICAL DATA:  Shortness of  breath. EXAM: PORTABLE CHEST 1 VIEW COMPARISON:  04/13/2021 and 08/24/2020 FINDINGS: Stable appearance of dual-chamber cardiac pacemaker. Elevation of the right hemidiaphragm. Scattered parenchymal lung densities, right side greater than left, and minimally changed. Heart size is stable. Evidence of pericardial calcifications. Stable pleural-based densities in the lateral right chest. IMPRESSION: 1. Stable chest radiograph findings. 2. Minimal change in the bilateral parenchymal lung densities, right side greater than left. Findings likely represent acute or chronic disease. Suspect right pleural fluid based on previous CT findings. 3. Stable appearance of the heart with a dual-chamber cardiac pacemaker and pericardial calcifications. Electronically Signed   By: Markus Daft M.D.   On: 04/15/2021 10:55   DG CHEST PORT 1 VIEW  Result Date: 04/13/2021 CLINICAL DATA:  Shortness of breath EXAM: PORTABLE CHEST 1 VIEW COMPARISON:  Yesterday FINDINGS: Dual lead pacer. Midline trachea. Mild cardiomegaly. Chronic calcific pericarditis. Moderate right hemidiaphragm elevation. No pleural effusion or pneumothorax. Diffuse interstitial lung disease. Worsened right upper and right infrahilar airspace disease. Similar left lower lung airspace disease. IMPRESSION: Slightly worsened right-sided aeration, suspicious for pneumonia superimposed upon interstitial  lung disease. No change in left lower lobe airspace disease. Aortic Atherosclerosis (ICD10-I70.0). Electronically Signed   By: Abigail Miyamoto M.D.   On: 04/13/2021 09:02   DG CHEST PORT 1 VIEW  Result Date: 04/12/2021 CLINICAL DATA:  Shortness of breath. EXAM: PORTABLE CHEST 1 VIEW COMPARISON:  04/11/2021.  Chest CTA dated 04/09/2021. FINDINGS: Stable mildly enlarged cardiac silhouette and tortuous and calcified thoracic aorta. Stable left subclavian bipolar pacemaker leads. Stable dense pericardial calcifications. Stable diffuse prominence of the interstitial markings.  Interval mild patchy opacity in the left lower lung zone and right upper lobe with associated mild right upper lobe volume loss. Diffuse osteopenia. IMPRESSION: 1. Interval patchy atelectasis or pneumonia in the left lower lung zone. 2. Interval mild right upper lobe atelectasis. 3. Stable chronic interstitial lung disease. 4. Stable changes of calcific pericarditis. Electronically Signed   By: Claudie Revering M.D.   On: 04/12/2021 11:51   DG CHEST PORT 1 VIEW  Result Date: 04/11/2021 CLINICAL DATA:  Shortness of breath EXAM: PORTABLE CHEST 1 VIEW COMPARISON:  04/10/2021 FINDINGS: Left-sided implanted cardiac device remains in place. Stable cardiomediastinal contours. Aortic atherosclerosis. Diffuse interstitial opacities persist, not appreciably changed from prior. No pleural effusion. No pneumothorax. IMPRESSION: No significant interval change in diffuse interstitial opacities. Electronically Signed   By: Davina Poke D.O.   On: 04/11/2021 09:29   DG Chest Port 1 View  Result Date: 04/09/2021 CLINICAL DATA:  80 year old male with shortness of breath. Recently diagnosed with pneumonia. History of lung cancer and interstitial lung disease. EXAM: PORTABLE CHEST 1 VIEW COMPARISON:  Chest CT 03/10/2021 and earlier. FINDINGS: Portable AP upright view at 1106 hours. Lower lung volumes. Stable cardiac size and mediastinal contours. Chronic pericardial calcification. Stable left chest dual lead cardiac pacemaker. Increased vague right upper lobe and streaky left lung base opacity from earlier this year. Small pleural effusions seen by CT last month likely persist. No pneumothorax. Lung markings elsewhere appears stable. IMPRESSION: Chronic lung disease with lower lung volumes. Increased right upper lobe and left lung base opacity is nonspecific but could indicate acute infectious exacerbation. Query signs/symptoms of pneumonia. And small pleural effusion seen by CT last month likely persist. Electronically Signed    By: Genevie Ann M.D.   On: 04/09/2021 11:33   ECHOCARDIOGRAM COMPLETE  Result Date: 04/13/2021    ECHOCARDIOGRAM REPORT   Patient Name:   CORNEL WERBER Date of Exam: 04/13/2021 Medical Rec #:  [id number]      Height:       69.0 in Accession #:    [id number]     Weight:       154.8 lb Date of Birth:  11/22/1940       BSA:          1.853 m Patient Age:    47 years       BP:           106/69 mmHg Patient Gender: M              HR:           77 bpm. Exam Location:  Inpatient Procedure: 2D Echo, Cardiac Doppler and Color Doppler Indications:    dyspnea  History:        Patient has prior history of Echocardiogram examinations, most                 recent 08/14/2020. Pacemaker; Arrythmias:Atrial Fibrillation.  Sonographer:    [place] Referring Phys: [id number] [place]  1. Left ventricular ejection fraction, by estimation, is 60 to 65%. The left ventricle has normal function. The left ventricle has no regional wall motion abnormalities. Left ventricular diastolic parameters are consistent with Grade I diastolic dysfunction (impaired relaxation).  2. Right ventricular systolic function is normal. The right ventricular size is normal. Tricuspid regurgitation signal is inadequate for assessing PA pressure.  3. Left atrial size was moderately dilated.  4. Right atrial size was moderately dilated.  5. The mitral valve is normal in structure. No evidence of mitral valve regurgitation. No evidence of mitral stenosis.  6. The aortic valve is tricuspid. Aortic valve regurgitation is not visualized. No aortic stenosis is present.  7. Aortic dilatation noted. There is mild dilatation of the aortic root, measuring 42 mm.  8. The inferior vena cava is dilated in size with >50% respiratory variability, suggesting right atrial pressure of 8 mmHg. FINDINGS  Left Ventricle: Left ventricular ejection fraction, by estimation, is 60 to 65%. The left ventricle has normal function. The left ventricle has no regional wall motion  abnormalities. The left ventricular internal cavity size was normal in size. There is  no left ventricular hypertrophy. Left ventricular diastolic parameters are consistent with Grade I diastolic dysfunction (impaired relaxation). Right Ventricle: The right ventricular size is normal. No increase in right ventricular wall thickness. Right ventricular systolic function is normal. Tricuspid regurgitation signal is inadequate for assessing PA pressure. The tricuspid regurgitant velocity is 2.52 m/s, and with an assumed right atrial pressure of 8 mmHg, the estimated right ventricular systolic pressure is 09.6 mmHg. Left Atrium: Left atrial size was moderately dilated. Right Atrium: Right atrial size was moderately dilated. Pericardium: There is no evidence of pericardial effusion. Mitral Valve: The mitral valve is normal in structure. Mild mitral annular calcification. No evidence of mitral valve regurgitation. No evidence of mitral valve stenosis. Tricuspid Valve: The tricuspid valve is normal in structure. Tricuspid valve regurgitation is mild . No evidence of tricuspid stenosis. Aortic Valve: The aortic valve is tricuspid. Aortic valve regurgitation is not visualized. No aortic stenosis is present. Aortic valve mean gradient measures 3.0 mmHg. Aortic valve peak gradient measures 5.0 mmHg. Aortic valve area, by VTI measures 1.52 cm. Pulmonic Valve: The pulmonic valve was normal in structure. Pulmonic valve regurgitation is not visualized. No evidence of pulmonic stenosis. Aorta: Aortic dilatation noted. There is mild dilatation of the aortic root, measuring 42 mm. Venous: The inferior vena cava is dilated in size with greater than 50% respiratory variability, suggesting right atrial pressure of 8 mmHg. IAS/Shunts: There is left bowing of the interatrial septum, suggestive of elevated right atrial pressure. The interatrial septum was not well visualized. Additional Comments: A device lead is visualized.  LEFT  VENTRICLE PLAX 2D LVIDd:         3.50 cm     Diastology LVIDs:         2.30 cm     LV e' medial:    6.20 cm/s LV PW:         1.00 cm     LV E/e' medial:  9.3 LV IVS:        1.60 cm     LV e' lateral:   8.05 cm/s LVOT diam:     1.90 cm     LV E/e' lateral: 7.2 LV SV:         39 LV SV Index:   21 LVOT Area:     2.84 cm  LV Volumes (MOD) LV vol d,  MOD A4C: 40.5 ml LV vol s, MOD A4C: 13.4 ml LV SV MOD A4C:     40.5 ml RIGHT VENTRICLE            IVC RV S prime:     9.03 cm/s  IVC diam: 2.70 cm TAPSE (M-mode): 1.4 cm LEFT ATRIUM           Index       RIGHT ATRIUM           Index LA diam:      3.80 cm 2.05 cm/m  RA Area:     17.00 cm LA Vol (A2C): 50.1 ml 27.04 ml/m RA Volume:   40.20 ml  21.70 ml/m LA Vol (A4C): 50.7 ml 27.37 ml/m  AORTIC VALVE AV Area (Vmax):    1.70 cm AV Area (Vmean):   1.69 cm AV Area (VTI):     1.52 cm AV Vmax:           112.00 cm/s AV Vmean:          84.300 cm/s AV VTI:            0.255 m AV Peak Grad:      5.0 mmHg AV Mean Grad:      3.0 mmHg LVOT Vmax:         67.20 cm/s LVOT Vmean:        50.300 cm/s LVOT VTI:          0.137 m LVOT/AV VTI ratio: 0.54  AORTA Ao Root diam: 3.70 cm Ao Asc diam:  4.20 cm MITRAL VALVE               TRICUSPID VALVE MV Area (PHT): 3.44 cm    TR Peak grad:   25.4 mmHg MV E velocity: 57.80 cm/s  TR Vmax:        252.00 cm/s MV A velocity: 53.10 cm/s MV E/A ratio:  1.09        SHUNTS                            Systemic VTI:  0.14 m                            Systemic Diam: 1.90 cm Kirk Ruths MD Electronically signed by Kirk Ruths MD Signature Date/Time: 04/13/2021/1:50:53 PM    Final    CUP PACEART REMOTE DEVICE CHECK  Result Date: 03/25/2021 Scheduled remote reviewed. Normal device function.  Next remote 91 days. LR  IR THORACENTESIS ASP PLEURAL SPACE W/IMG GUIDE  Result Date: 04/10/2021 INDICATION: Patient with a history of acute on chronic heart failure and small cell lung cancer presents today with bilateral pleural effusion. Interventional  Radiology asked to perform a therapeutic and diagnostic thoracentesis. EXAM: ULTRASOUND GUIDED THORACENTESIS MEDICATIONS: 1% lidocaine 10 mL COMPLICATIONS: None immediate. PROCEDURE: An ultrasound guided thoracentesis was thoroughly discussed with the patient and questions answered. The benefits, risks, alternatives and complications were also discussed. The patient understands and wishes to proceed with the procedure. Written consent was obtained. Ultrasound was performed to localize and mark an adequate pocket of fluid in the right chest. The area was then prepped and draped in the normal sterile fashion. 1% Lidocaine was used for local anesthesia. Under ultrasound guidance a 6 Fr Safe-T-Centesis catheter was introduced. Thoracentesis was performed. The catheter was removed and a dressing applied. FINDINGS: A total of approximately 650 mL of  amber-colored fluid was removed. Samples were sent to the laboratory as requested by the clinical team. IMPRESSION: Successful ultrasound guided right thoracentesis yielding 650 mL of pleural fluid. Read by: Soyla Dryer, NP Electronically Signed   By: Ruthann Cancer M.D.   On: 04/10/2021 11:32     Subjective: Patient seen examined bedside, resting comfortably.  RN present at bedside.  No complaints this morning.  Discharging to SNF today.  Denies headache, no fever/chills/night sweats, no nausea/vomiting/diarrhea, no chest pain, no palpitations, no shortness of breath, no abdominal pain, no weakness, no fatigue, no paresthesias.  No acute events overnight per nursing staff.  Discharge Exam: Vitals:   04/15/21 1412 04/15/21 2103  BP: 112/66 126/78  Pulse: 73 72  Resp: 16 18  Temp: 98.1 F (36.7 C) 98 F (36.7 C)  SpO2: 93% 97%   Vitals:   04/14/21 2024 04/15/21 0358 04/15/21 1412 04/15/21 2103  BP: 122/73 104/69 112/66 126/78  Pulse: 70 63 73 72  Resp: 16 11 16 18   Temp: 97.8 F (36.6 C) 97.6 F (36.4 C) 98.1 F (36.7 C) 98 F (36.7 C)  TempSrc:  Oral Oral Oral Oral  SpO2: 95% 95% 93% 97%  Weight:      Height:        General: Pt is alert, awake, not in acute distress Cardiovascular: RRR, S1/S2 +, no rubs, no gallops Respiratory: CTA bilaterally, no wheezing, no rhonchi, on 3 L nasal cannula with SPO2 97% at rest Abdominal: Soft, NT, ND, bowel sounds + Extremities: no edema, no cyanosis    The results of significant diagnostics from this hospitalization (including imaging, microbiology, ancillary and laboratory) are listed below for reference.     Microbiology: Recent Results (from the past 240 hour(s))  Resp Panel by RT-PCR (Flu A&B, Covid) Nasopharyngeal Swab     Status: None   Collection Time: 04/09/21 11:47 AM   Specimen: Nasopharyngeal Swab; Nasopharyngeal(NP) swabs in vial transport medium  Result Value Ref Range Status   SARS Coronavirus 2 by RT PCR NEGATIVE NEGATIVE Final    Comment: (NOTE) SARS-CoV-2 target nucleic acids are NOT DETECTED.  The SARS-CoV-2 RNA is generally detectable in upper respiratory specimens during the acute phase of infection. The lowest concentration of SARS-CoV-2 viral copies this assay can detect is 138 copies/mL. A negative result does not preclude SARS-Cov-2 infection and should not be used as the sole basis for treatment or other patient management decisions. A negative result may occur with  improper specimen collection/handling, submission of specimen other than nasopharyngeal swab, presence of viral mutation(s) within the areas targeted by this assay, and inadequate number of viral copies(<138 copies/mL). A negative result must be combined with clinical observations, patient history, and epidemiological information. The expected result is Negative.  Fact Sheet for Patients:  EntrepreneurPulse.com.au  Fact Sheet for Healthcare Providers:  IncredibleEmployment.be  This test is no t yet approved or cleared by the [place] FDA and  has  been authorized for detection and/or diagnosis of SARS-CoV-2 by FDA under an Emergency Use Authorization (EUA). This EUA will remain  in effect (meaning this test can be used) for the duration of the COVID-19 declaration under Section 564(b)(1) of the Act, 21 U.S.C.section 360bbb-3(b)(1), unless the authorization is terminated  or revoked sooner.       Influenza A by PCR NEGATIVE NEGATIVE Final   Influenza B by PCR NEGATIVE NEGATIVE Final    Comment: (NOTE) The Xpert Xpress SARS-CoV-2/FLU/RSV plus assay is intended as an aid in the  diagnosis of influenza from Nasopharyngeal swab specimens and should not be used as a sole basis for treatment. Nasal washings and aspirates are unacceptable for Xpert Xpress SARS-CoV-2/FLU/RSV testing.  Fact Sheet for Patients: EntrepreneurPulse.com.au  Fact Sheet for Healthcare Providers: IncredibleEmployment.be  This test is not yet approved or cleared by the [place] FDA and has been authorized for detection and/or diagnosis of SARS-CoV-2 by FDA under an Emergency Use Authorization (EUA). This EUA will remain in effect (meaning this test can be used) for the duration of the COVID-19 declaration under Section 564(b)(1) of the Act, 21 U.S.C. section 360bbb-3(b)(1), unless the authorization is terminated or revoked.  Performed at [facility], [address], [place], [place] [address]   Culture, blood (Routine X 2) w Reflex to ID Panel     Status: None   Collection Time: 04/09/21  2:25 PM   Specimen: BLOOD  Result Value Ref Range Status   Specimen Description BLOOD LEFT ANTECUBITAL  Final   Special Requests   Final    BOTTLES DRAWN AEROBIC AND ANAEROBIC Blood Culture adequate volume   Culture   Final    NO GROWTH 5 DAYS Performed at [facility], [place] [address], [place], [place] [address]    Report Status 04/14/2021 FINAL  Final  Culture, blood (Routine X 2) w Reflex to ID  Panel     Status: None   Collection Time: 04/09/21  2:25 PM   Specimen: BLOOD  Result Value Ref Range Status   Specimen Description BLOOD RIGHT ANTECUBITAL  Final   Special Requests   Final    BOTTLES DRAWN AEROBIC AND ANAEROBIC Blood Culture adequate volume   Culture   Final    NO GROWTH 5 DAYS Performed at [facility], [place] [address], [place], [place] [address]    Report Status 04/14/2021 FINAL  Final  Gram stain     Status: None   Collection Time: 04/10/21 11:04 AM   Specimen: Lung, Right; Pleural Fluid  Result Value Ref Range Status   Specimen Description FLUID RIGHT PLEURAL  Final   Special Requests NONE  Final   Gram Stain   Final    MODERATE WBC PRESENT,BOTH PMN AND MONONUCLEAR NO ORGANISMS SEEN Performed at [facility], [place] [address] [place], [place] [address]    Report Status 04/10/2021 FINAL  Final  Culture, body fluid w Gram Stain-bottle     Status: None   Collection Time: 04/10/21 11:04 AM   Specimen: Fluid  Result Value Ref Range Status   Specimen Description FLUID RIGHT PLEURAL  Final   Special Requests BOTTLES DRAWN AEROBIC AND ANAEROBIC 10CC  Final   Culture   Final    NO GROWTH 5 DAYS Performed at [facility], [place] [address] [place], [place] [address]    Report Status 04/15/2021 FINAL  Final     Labs: BNP (last 3 results) Recent Labs    08/25/20 0046 04/09/21 1047 04/12/21 1133  BNP 976.1* 336.1* 967.8*   Basic Metabolic Panel: Recent Labs  Lab 04/12/21 0146 04/13/21 0331 04/14/21 0152 04/15/21 0946 04/16/21 0141  NA 132* 133* 132* 133* 134*  K 4.0 4.0 3.9 4.5 4.1  CL 100 102 101 101 96*  CO2 25 26 25 24 28   GLUCOSE 94 99 124* 167* 142*  BUN 18 15 18 21  31*  CREATININE 0.76 0.81 0.81 0.76 1.05  CALCIUM 9.4 9.4 9.5 10.2 10.1  MG 1.8 1.8 1.8 2.1 2.1  PHOS 2.7 2.4* 2.4* 2.9 3.3   Liver Function Tests: Recent Labs  Lab 04/12/21 0146 04/13/21 0331 04/14/21 0152 04/15/21 0946 04/16/21 0141  AST 29 19 21 29  41   ALT 26 22 25  35 47*  ALKPHOS 373* 392* 424* 465* 440*  BILITOT 2.1* 1.6* 1.7* 1.6* 1.6*  PROT 5.3* 5.3* 5.6* 5.9* 5.8*  ALBUMIN 2.6* 2.4* 2.5* 2.8* 2.7*   No results for input(s): LIPASE, AMYLASE in the last 168 hours. No results for input(s): AMMONIA in the last 168 hours. CBC: Recent Labs  Lab 04/12/21 0146 04/13/21 0331 04/14/21 0152 04/15/21 0946 04/16/21 0141  WBC 6.5 5.4 5.4 6.1 8.5  NEUTROABS 5.4 4.3 4.6 5.8 7.9*  HGB 10.6* 10.6* 10.0* 10.8* 10.4*  HCT 32.6* 32.0* 31.1* 33.3* 32.2*  MCV 85.3 85.1 85.2 85.6 85.4  PLT 107* 102* 120* 128* 157   Cardiac Enzymes: No results for input(s): CKTOTAL, CKMB, CKMBINDEX, TROPONINI in the last 168 hours. BNP: Invalid input(s): POCBNP CBG: No results for input(s): GLUCAP in the last 168 hours. D-Dimer No results for input(s): DDIMER in the last 72 hours. Hgb A1c No results for input(s): HGBA1C in the last 72 hours. Lipid Profile No results for input(s): CHOL, HDL, LDLCALC, TRIG, CHOLHDL, LDLDIRECT in the last 72 hours. Thyroid function studies No results for input(s): TSH, T4TOTAL, T3FREE, THYROIDAB in the last 72 hours.  Invalid input(s): FREET3 Anemia work up No results for input(s): VITAMINB12, FOLATE, FERRITIN, TIBC, IRON, RETICCTPCT in the last 72 hours. Urinalysis    Component Value Date/Time   COLORURINE YELLOW 03/08/2020 1330   APPEARANCEUR CLEAR 03/08/2020 1330   LABSPEC 1.011 03/08/2020 1330   PHURINE 7.0 03/08/2020 1330   GLUCOSEU NEGATIVE 03/08/2020 1330   HGBUR NEGATIVE 03/08/2020 1330   BILIRUBINUR NEGATIVE 03/08/2020 1330   KETONESUR NEGATIVE 03/08/2020 1330   PROTEINUR NEGATIVE 03/08/2020 1330   UROBILINOGEN 1.0 05/17/2015 0858   NITRITE NEGATIVE 03/08/2020 1330   LEUKOCYTESUR NEGATIVE 03/08/2020 1330   Sepsis Labs Invalid input(s): PROCALCITONIN,  WBC,  LACTICIDVEN Microbiology Recent Results (from the past 240 hour(s))  Resp Panel by RT-PCR (Flu A&B, Covid) Nasopharyngeal Swab     Status: None    Collection Time: 04/09/21 11:47 AM   Specimen: Nasopharyngeal Swab; Nasopharyngeal(NP) swabs in vial transport medium  Result Value Ref Range Status   SARS Coronavirus 2 by RT PCR NEGATIVE NEGATIVE Final    Comment: (NOTE) SARS-CoV-2 target nucleic acids are NOT DETECTED.  The SARS-CoV-2 RNA is generally detectable in upper respiratory specimens during the acute phase of infection. The lowest concentration of SARS-CoV-2 viral copies this assay can detect is 138 copies/mL. A negative result does not preclude SARS-Cov-2 infection and should not be used as the sole basis for treatment or other patient management decisions. A negative result may occur with  improper specimen collection/handling, submission of specimen other than nasopharyngeal swab, presence of viral mutation(s) within the areas targeted by this assay, and inadequate number of viral copies(<138 copies/mL). A negative result must be combined with clinical observations, patient history, and epidemiological information. The expected result is Negative.  Fact Sheet for Patients:  EntrepreneurPulse.com.au  Fact Sheet for Healthcare Providers:  IncredibleEmployment.be  This test is no t yet approved or cleared by the [place] FDA and  has been authorized for detection and/or diagnosis of SARS-CoV-2 by FDA under an Emergency Use Authorization (EUA). This EUA will remain  in effect (meaning this test can be used) for the duration of the COVID-19 declaration under Section  564(b)(1) of the Act, 21 U.S.C.section 360bbb-3(b)(1), unless the authorization is terminated  or revoked sooner.       Influenza A by PCR NEGATIVE NEGATIVE Final   Influenza B by PCR NEGATIVE NEGATIVE Final    Comment: (NOTE) The Xpert Xpress SARS-CoV-2/FLU/RSV plus assay is intended as an aid in the diagnosis of influenza from Nasopharyngeal swab specimens and should not be used as a sole basis for treatment.  Nasal washings and aspirates are unacceptable for Xpert Xpress SARS-CoV-2/FLU/RSV testing.  Fact Sheet for Patients: EntrepreneurPulse.com.au  Fact Sheet for Healthcare Providers: IncredibleEmployment.be  This test is not yet approved or cleared by the [place] FDA and has been authorized for detection and/or diagnosis of SARS-CoV-2 by FDA under an Emergency Use Authorization (EUA). This EUA will remain in effect (meaning this test can be used) for the duration of the COVID-19 declaration under Section 564(b)(1) of the Act, 21 U.S.C. section 360bbb-3(b)(1), unless the authorization is terminated or revoked.  Performed at [facility], [address], [place], [place] [address]   Culture, blood (Routine X 2) w Reflex to ID Panel     Status: None   Collection Time: 04/09/21  2:25 PM   Specimen: BLOOD  Result Value Ref Range Status   Specimen Description BLOOD LEFT ANTECUBITAL  Final   Special Requests   Final    BOTTLES DRAWN AEROBIC AND ANAEROBIC Blood Culture adequate volume   Culture   Final    NO GROWTH 5 DAYS Performed at [facility], [place] [address], [place], [place] [address]    Report Status 04/14/2021 FINAL  Final  Culture, blood (Routine X 2) w Reflex to ID Panel     Status: None   Collection Time: 04/09/21  2:25 PM   Specimen: BLOOD  Result Value Ref Range Status   Specimen Description BLOOD RIGHT ANTECUBITAL  Final   Special Requests   Final    BOTTLES DRAWN AEROBIC AND ANAEROBIC Blood Culture adequate volume   Culture   Final    NO GROWTH 5 DAYS Performed at [facility], [place] [address] [place], [place] [address]    Report Status 04/14/2021 FINAL  Final  Gram stain     Status: None   Collection Time: 04/10/21 11:04 AM   Specimen: Lung, Right; Pleural Fluid  Result Value Ref Range Status   Specimen Description FLUID RIGHT PLEURAL  Final   Special Requests NONE  Final   Gram Stain    Final    MODERATE WBC PRESENT,BOTH PMN AND MONONUCLEAR NO ORGANISMS SEEN Performed at [facility], [place] [address] [place], [place] [address]    Report Status 04/10/2021 FINAL  Final  Culture, body fluid w Gram Stain-bottle     Status: None   Collection Time: 04/10/21 11:04 AM   Specimen: Fluid  Result Value Ref Range Status   Specimen Description FLUID RIGHT PLEURAL  Final   Special Requests BOTTLES DRAWN AEROBIC AND ANAEROBIC 10CC  Final   Culture   Final    NO GROWTH 5 DAYS Performed at [facility], [place] [address] [place], [place] [address]    Report Status 04/15/2021 FINAL  Final     Time coordinating discharge: Over 30 minutes  SIGNED:   Jabez Molner J [place] DO  [facility] 04/16/2021, 10:50 AM

## 2021-04-16 NOTE — TOC Transition Note (Signed)
Transition of Care [facility]) - CM/SW Discharge Note   Patient Details  Name: Alexander Murray, Dr. 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  RN call report to [phone].    Final next level of care: Skilled Nursing Facility Barriers to Discharge: Barriers Resolved   Patient Goals and CMS Choice Patient states their goals for this hospitalization and ongoing recovery are:: "get back to normal"      Discharge Placement              Patient chooses bed at:  [facility]) Patient to be transferred to facility by: [facility] Name of family member notified: wife Ramona in room Patient and family notified of of transfer: 04/16/21  Discharge Plan and Services In-house Referral: Clinical Social Work   Post Acute Care Choice:  (TBD)                               Social Determinants of Health (SDOH) Interventions     Readmission Risk Interventions No flowsheet data found.

## 2021-04-16 NOTE — Progress Notes (Signed)
Discharge instructions discussed with pt and wife. All belongings returned as listed above. Placed in discharge packet.

## 2021-04-16 NOTE — Plan of Care (Signed)
Pt. Belongings returned to [employer]. Watch, clothing, overnight bag and cane.

## 2021-04-16 NOTE — Discharge Instructions (Signed)

## 2021-04-17 ENCOUNTER — Non-Acute Institutional Stay (SKILLED_NURSING_FACILITY): Payer: Medicare Other | Admitting: Adult Health

## 2021-04-17 ENCOUNTER — Encounter: Payer: Self-pay | Admitting: Adult Health

## 2021-04-17 ENCOUNTER — Ambulatory Visit: Payer: Medicare Other

## 2021-04-17 DIAGNOSIS — J9601 Acute respiratory failure with hypoxia: Secondary | ICD-10-CM

## 2021-04-17 DIAGNOSIS — R059 Cough, unspecified: Secondary | ICD-10-CM | POA: Diagnosis not present

## 2021-04-17 DIAGNOSIS — Z87438 Personal history of other diseases of male genital organs: Secondary | ICD-10-CM | POA: Diagnosis not present

## 2021-04-17 DIAGNOSIS — K219 Gastro-esophageal reflux disease without esophagitis: Secondary | ICD-10-CM | POA: Diagnosis not present

## 2021-04-17 DIAGNOSIS — J90 Pleural effusion, not elsewhere classified: Secondary | ICD-10-CM

## 2021-04-17 DIAGNOSIS — C3431 Malignant neoplasm of lower lobe, right bronchus or lung: Secondary | ICD-10-CM

## 2021-04-17 DIAGNOSIS — E440 Moderate protein-calorie malnutrition: Secondary | ICD-10-CM | POA: Diagnosis not present

## 2021-04-17 DIAGNOSIS — K5901 Slow transit constipation: Secondary | ICD-10-CM

## 2021-04-17 DIAGNOSIS — I4821 Permanent atrial fibrillation: Secondary | ICD-10-CM

## 2021-04-17 DIAGNOSIS — I712 Thoracic aortic aneurysm, without rupture: Secondary | ICD-10-CM | POA: Diagnosis not present

## 2021-04-17 DIAGNOSIS — I5033 Acute on chronic diastolic (congestive) heart failure: Secondary | ICD-10-CM

## 2021-04-17 DIAGNOSIS — E782 Mixed hyperlipidemia: Secondary | ICD-10-CM | POA: Diagnosis not present

## 2021-04-17 DIAGNOSIS — I7121 Aneurysm of the ascending aorta, without rupture: Secondary | ICD-10-CM

## 2021-04-17 NOTE — Telephone Encounter (Signed)
See other phone note from 9/2. Pt was admitted on 04/09/21. Will close this encounter.

## 2021-04-17 NOTE — Progress Notes (Addendum)
Location:  [facility] Room Number: 158-A Place of Service:  SNF [phone]) Provider:  Royal Hawthorn, NP   Patient Care Team: [name], MD as PCP - General (Internal Medicine) Deboraha Sprang, MD as PCP - Electrophysiology (Cardiology) Valrie Hart, RN as Oncology Nurse Navigator  Extended Emergency Contact Information Primary Emergency Contact: Dorann Lodge Address: [address]          [place], [place] [address] [name] of [place] Phone: [phone] Mobile Phone: [phone] Relation: Spouse  Code Status:  Full Code Goals of care: Advanced Directive information Advanced Directives 04/17/2021  Does Patient Have a Medical Advance Directive? Yes  Type of Paramedic of [place];Living will  Does patient want to make changes to medical advance directive? No - Patient declined  Copy of [place] in Chart? Yes - validated most recent copy scanned in chart (See row information)  Would patient like information on creating a medical advance directive? -     Chief Complaint  Patient presents with   Acute Visit    Pneumonia follow-up     HPI:  Pt is a 80 y.o. male seen today for an acute visit for healthcare-associated pneumonia.   He currently resides at [facility]. Patient has a past medical history of CHB s/p PPM, atrial fibrillation on Tikosyn/Xarelto, essential hypertension, small cell lung cancer s/p wedge resection and chemotherapy/radiation.    Patient was admitted to ED on 9/7 for evaluation of progressive SOB. In the ED patient with saturations in mid 90's on 3L of O2. EKG showed paced rhythm and labs showed chronic elevations in alkaline phosphatase and total bilirubin. BNP elevated at 336.  CTA chest negative for PE but showed groundglass opacities and interlobular septal thickening on a background of fibrotic changes as well as increased bilateral pleural effusions.  Patient was treated for acute hypoxic respiratory failure with IV antibiotics vancomycin, cefepime, doxycycline and ceftriaxone, IV steroids Solu-Medrol and transitioned to 40 mg Prednisone. The plan is to continue Prednisone until follow up with pulmonology in 2 weeks, continue singular and supplemental oxygen 3 L via nasal cannula.   Acute on chronic diastolic CHF, bilateral pleural effusion patient underwent right thoracentesis with 650 mL of pleural fluid removed.  Pleural fluid analysis showed transudative effusion. He was treated with IV diuresis and then transitioned back to torsemide 40 mg qod and to continue metoprolol succinate 12.5 mg.    Patient completed radiation treatment in July and is followed by his oncologist but continued to experience worsening sob and fatigue for over a month. He was prescribed prednisone and torsemide but was not taking torsemide daily. He was also seen at an outside emergency department 04/05/2021 for worsening dyspnea was diagnosed with bilateral pneumonia and started on doxycycline and supplemental oxygen.  Today patient reports that he continues to experience SOB but has not ambulated in the room independently. He is currently on 2L of O2 that staff increases to 3L with activity. He reports that his bottom is sore from sitting in the chair and voiced concerns about his weight loss weigh in 08/22 156 and 141.6 on admission. At home he was drinking 2 boost supplements per day. Last BM 3 days ago. Uses condom cath.     Past Medical History:  Diagnosis Date   Allergy    Arthritis    "right knee and right thumb" (12/19/2014)   Asthma    pt denies   Atrial fibrillation ([place]) 2021  Bronchitis 07/2016   started in December and continued about 2 months   Central retinal artery occlusion 06/26/2013   CHF (congestive heart failure) ([facility]) 08/24/2020   Esophageal stricture    GERD (gastroesophageal reflux disease)    Hemochromatosis    Hiatal hernia     Hypercalcemia    Hypercholesteremia    Hypertension    Internal hemorrhoids    Lung cancer ([name])    Osteoarthritis    Palpitations    Persistent atrial fibrillation ([name])    Presence of permanent cardiac pacemaker    hx bradycardia   Retinal artery occlusion, branch    "right eye"   Second degree AV block, Mobitz type II    Skin cancer    right shoulder   Small cell lung cancer, right lower lobe ([facility]) 03/12/2020   Stroke ([facility]) ~ 2011   "right eye"/ partial blindness   Tubular adenoma of colon    Past Surgical History:  Procedure Laterality Date   CARDIOVERSION N/A 07/29/2020   Procedure: CARDIOVERSION;  Surgeon: Jerline Pain, MD;  Location: [facility];  Service: Cardiovascular;  Laterality: N/A;  NEEDS RAPID COVID TEST MORNING OF   CARDIOVERSION N/A 08/19/2020   Procedure: CARDIOVERSION;  Surgeon: Thayer Headings, MD;  Location: [facility] ENDOSCOPY;  Service: Cardiovascular;  Laterality: N/A;   CATARACT EXTRACTION, BILATERAL  01/2017   COLONOSCOPY     EP IMPLANTABLE DEVICE N/A 12/19/2014   Procedure: Pacemaker Implant;  Surgeon: Deboraha Sprang, MD;  Location: [facility] CV LAB;  Service: Cardiovascular;  Laterality: N/A;   ESOPHAGOGASTRODUODENOSCOPY (EGD) WITH ESOPHAGEAL DILATION  X 3   EYE SURGERY Bilateral 2017   INGUINAL HERNIA REPAIR Left 1980's   INSERT / REPLACE / REMOVE PACEMAKER  12/19/2014   INTERCOSTAL NERVE BLOCK Right 03/11/2020   Procedure: INTERCOSTAL NERVE BLOCK;  Surgeon: Grace Isaac, MD;  Location: [facility];  Service: Thoracic;  Laterality: Right;   IR THORACENTESIS ASP PLEURAL SPACE W/IMG GUIDE  04/10/2021   JOINT REPLACEMENT Right 2016   KNEE ARTHROSCOPY Right ~ 1982; ~ [place] Right ~ 2014   "pre-melanoma scapula"   POSTERIOR TIBIAL TENDON REPAIR Left 2012   TONSILLECTOMY  1946   TOTAL KNEE ARTHROPLASTY Right 05/20/2015   Procedure: RIGHT TOTAL KNEE ARTHROPLASTY;  Surgeon: Paralee Cancel, MD;  Location: [facility];  Service: Orthopedics;  Laterality:  Right;   UPPER GASTROINTESTINAL ENDOSCOPY     VIDEO BRONCHOSCOPY N/A 03/11/2020   Procedure: VIDEO BRONCHOSCOPY;  Surgeon: Grace Isaac, MD;  Location: [facility];  Service: Thoracic;  Laterality: N/A;   VIDEO BRONCHOSCOPY WITH ENDOBRONCHIAL ULTRASOUND N/A 11/18/2020   Procedure: VIDEO BRONCHOSCOPY WITH ENDOBRONCHIAL ULTRASOUND;  Surgeon: Melrose Nakayama, MD;  Location: [facility];  Service: Thoracic;  Laterality: N/A;   XI ROBOTIC ASSISTED THORACOSCOPY- SEGMENTECTOMY Right 03/11/2020   Procedure: XI ROBOTIC ASSISTED THORACOSCOPY-RIGHT SUPERIOR SEGMENTECTOMY WITH NODE SAMPLES;  Surgeon: Grace Isaac, MD;  Location: [facility];  Service: Thoracic;  Laterality: Right;    Allergies  Allergen Reactions   Lisinopril Palpitations and Other (See Comments)    Dizziness, also    Outpatient Encounter Medications as of 04/17/2021  Medication Sig   acetaminophen (TYLENOL) 500 MG tablet Take 500-1,000 mg by mouth every 6 (six) hours as needed for moderate pain.   albuterol (VENTOLIN HFA) 108 (90 Base) MCG/ACT inhaler Inhale 1 puff into the lungs every 6 (six) hours as needed for wheezing or shortness of breath.   atorvastatin (LIPITOR) 20 MG  tablet TAKE 1 TABLET(20 MG) BY MOUTH DAILY   benzonatate (TESSALON) 200 MG capsule Take 1 capsule (200 mg total) by mouth 3 (three) times daily as needed for cough.   dofetilide (TIKOSYN) 250 MCG capsule Take 1 capsule (250 mcg total) by mouth 2 (two) times daily.   doxazosin (CARDURA) 4 MG tablet Take 4 mg by mouth daily.    esomeprazole (NEXIUM) 20 MG capsule Take 20 mg by mouth daily before breakfast.   memantine (NAMENDA) 10 MG tablet Take 1 tablet (10 mg total) by mouth 2 (two) times daily.   metoprolol succinate (TOPROL-XL) 25 MG 24 hr tablet Take 0.5 tablets (12.5 mg total) by mouth at bedtime.   montelukast (SINGULAIR) 10 MG tablet Take 10 mg by mouth every morning.    Multiple Vitamins-Minerals (PRESERVISION AREDS 2 PO) Take 1 capsule by mouth in the morning  and at bedtime.    potassium chloride SA (KLOR-CON) 20 MEQ tablet Take 2 tablets (40 mEq total) by mouth every other day. Take with torsemide   predniSONE (DELTASONE) 10 MG tablet Take 4 tablets (40 mg total) by mouth daily.   rivaroxaban (XARELTO) 20 MG TABS tablet Take 1 tablet (20 mg total) by mouth daily with supper.   senna-docusate (SENOKOT-S) 8.6-50 MG tablet Take 1 tablet by mouth at bedtime as needed for mild constipation.   torsemide (DEMADEX) 20 MG tablet Take 1 tablet (20 mg total) by mouth every other day.   traZODone (DESYREL) 50 MG tablet Take 1 tablet (50 mg total) by mouth at bedtime as needed for sleep.   vitamin C (ASCORBIC ACID) 500 MG tablet Take 500 mg by mouth at bedtime.    COVID-19 mRNA Vac-TriS, Pfizer, (PFIZER-BIONT COVID-19 VAC-TRIS) SUSP injection Inject into the muscle.   No facility-administered encounter medications on file as of 04/17/2021.    Review of Systems  Constitutional:  Positive for activity change, appetite change and unexpected weight change. Negative for fatigue and fever.  HENT:  Positive for hearing loss.   Eyes: Negative.   Respiratory:  Positive for cough and shortness of breath.   Cardiovascular:  Negative for leg swelling.  Gastrointestinal:  Positive for constipation.  Endocrine: Negative.   Genitourinary: Negative.   Musculoskeletal: Negative.   Skin:  Negative for color change.   Immunization History  Administered Date(s) Administered   Influenza Split 06/16/2011, 05/17/2012, 05/12/2013, 05/03/2014   Influenza, High Dose Seasonal PF 05/01/2017   Influenza, Quadrivalent, Recombinant, Inj, Pf 05/07/2018, 03/30/2019   Influenza,inj,Quad PF,6+ Mos 05/15/2013, 03/30/2016   Influenza-Unspecified 05/03/2014, 05/15/2017   PFIZER Comirnaty(Gray Top)Covid-19 Tri-Sucrose Vaccine 11/19/2020   PFIZER(Purple Top)SARS-COV-2 Vaccination 07/29/2019, 08/09/2019, 11/19/2020   Pneumococcal Conjugate-13 12/01/2013, 04/03/2014   Pneumococcal  Polysaccharide-23 06/04/2003, 06/13/2003, 07/23/2010   Td,absorbed, Preservative Free, Adult Use, Lf Unspecified 06/17/2005, 07/23/2010   Zoster Recombinat (Shingrix) 08/13/2017   Zoster, Live 10/02/2007   Pertinent  Health Maintenance Due  Topic Date Due   INFLUENZA VACCINE  05/10/2021 (Originally 03/03/2021)   COLONOSCOPY (Pts 45-42yrs Insurance coverage will need to be confirmed)  01/05/2023   PNA vac Low Risk Adult  Completed   No flowsheet data found. Functional Status Survey:    Vitals:   04/17/21 1004  BP: 129/79  Pulse: 80  Temp: 98 F (36.7 C)  SpO2: 92%  Weight: 141 lb 9.6 oz (64.2 kg)   Body mass index is 20.91 kg/m. Physical Exam Constitutional:      General: He is not in acute distress.    Appearance: Normal appearance.  HENT:     Head: Normocephalic.     Mouth/Throat:     Mouth: Mucous membranes are moist.     Pharynx: No oropharyngeal exudate or posterior oropharyngeal erythema.  Cardiovascular:     Rate and Rhythm: Normal rate and regular rhythm.  Pulmonary:     Breath sounds: Rhonchi (cleared with deep breath) present. No wheezing.  Abdominal:     General: There is no distension.  Musculoskeletal:     Cervical back: Neck supple.     Right lower leg: No edema.     Left lower leg: No edema.  Skin:    General: Skin is warm and dry.  Neurological:     Mental Status: He is alert.  Psychiatric:        Mood and Affect: Mood normal.    Labs reviewed: Recent Labs    04/14/21 0152 04/15/21 0946 04/16/21 0141  NA 132* 133* 134*  K 3.9 4.5 4.1  CL 101 101 96*  CO2 25 24 28   GLUCOSE 124* 167* 142*  BUN 18 21 31*  CREATININE 0.81 0.76 1.05  CALCIUM 9.5 10.2 10.1  MG 1.8 2.1 2.1  PHOS 2.4* 2.9 3.3   Recent Labs    04/14/21 0152 04/15/21 0946 04/16/21 0141  AST 21 29 41  ALT 25 35 47*  ALKPHOS 424* 465* 440*  BILITOT 1.7* 1.6* 1.6*  PROT 5.6* 5.9* 5.8*  ALBUMIN 2.5* 2.8* 2.7*   Recent Labs    04/14/21 0152 04/15/21 0946  04/16/21 0141  WBC 5.4 6.1 8.5  NEUTROABS 4.6 5.8 7.9*  HGB 10.0* 10.8* 10.4*  HCT 31.1* 33.3* 32.2*  MCV 85.2 85.6 85.4  PLT 120* 128* 157   Lab Results  Component Value Date   TSH 5.630 (H) 08/23/2020   No results found for: HGBA1C No results found for: CHOL, HDL, LDLCALC, LDLDIRECT, TRIG, CHOLHDL  Significant Diagnostic Results in last 30 days:  DG Chest 1 View  Result Date: 04/10/2021 CLINICAL DATA:  Status right post thoracentesis EXAM: CHEST  1 VIEW COMPARISON:  Multiple priors FINDINGS: There is a 2 lead implantable cardiac device with leads terminating in the right atrium and right ventricle. Again seen are pericardial calcifications. Improved effusion on the right side. No pneumothorax. There remains bilateral interstitial and pulmonary opacities as seen on recent cross-sectional CT of the chest. No acute osseous abnormality. IMPRESSION: Improved right-sided effusion after right-sided thoracentesis. No pneumothorax. Similar appearance of bilateral pulmonary opacities. Electronically Signed   By: Albin Felling M.D.   On: 04/10/2021 11:25   CT Angio Chest PE W/Cm &/Or Wo Cm  Result Date: 04/09/2021 CLINICAL DATA:  PE suspected, shortness of breath EXAM: CT ANGIOGRAPHY CHEST WITH CONTRAST TECHNIQUE: Multidetector CT imaging of the chest was performed using the standard protocol during bolus administration of intravenous contrast. Multiplanar CT image reconstructions and MIPs were obtained to evaluate the vascular anatomy. CONTRAST:  78mL OMNIPAQUE IOHEXOL 350 MG/ML SOLN COMPARISON:  03/10/2021 CT chest FINDINGS: Cardiovascular: Satisfactory opacification of the pulmonary arteries to the segmental level. No evidence of pulmonary embolism. The heart is normal in size. Redemonstrated extensive pericardial calcifications. Aortic atherosclerosis, extending into the aortic arch branch vessels. Three-vessel coronary artery calcifications. Redemonstrated aortic ectasia, measuring up to 4.2 cm.  Left chest pacemaker with leads in the right atrium and right ventricle. Reflux of contrast into the IVC and hepatic veins. Mediastinum/Nodes: No enlarged mediastinal, hilar, or axillary lymph nodes. Thyroid gland, trachea, and esophagus demonstrate no significant findings. Lungs/Pleura: Postoperative changes  of wedge resection of the superior segment of the right lower lobe. Increased areas of ground-glass attenuation and septal thickening superimposed on previously noted fibrotic changes. Increased right pleural effusion, large, and left pleural effusion now moderate. No pneumothorax. Upper Abdomen: No acute abnormality. Musculoskeletal: No chest wall abnormality. No acute or significant osseous findings. Redemonstrated compression deformity of T11. Review of the MIP images confirms the above findings. IMPRESSION: 1. Increased ground-glass opacities and interlobular septal thickening, superimposed on background fibrotic changes, with increased bilateral pleural effusions, concerning for pulmonary edema, although superimposed infection can not be excluded. 2. Reflux of contrast into the hepatic veins, which can be seen in the setting of heart failure. Correlate with BNP. 3. Negative for pulmonary embolism to the level of the segmental arteries. 4. Ectasia of the ascending thoracic aorta, measuring to 4.2 cm in diameter. Recommend annual imaging followup by CTA or MRA. This recommendation follows 2010 ACCF/AHA/AATS/ACR/ASA/SCA/SCAI/SIR/STS/SVM Guidelines for the Diagnosis and Management of Patients with Thoracic Aortic Disease. Circulation. 2010; 121: Q982-M415. Aortic aneurysm NOS (ICD10-I71.9) 5.  Aortic Atherosclerosis (ICD10-I70.0). Electronically Signed   By: Merilyn Baba M.D.   On: 04/09/2021 13:37   DG CHEST PORT 1 VIEW  Result Date: 04/16/2021 CLINICAL DATA:  Dyspnea. EXAM: PORTABLE CHEST 1 VIEW COMPARISON:  April 15, 2021. FINDINGS: Stable cardiomegaly. Left-sided pacemaker is unchanged in  position. No pneumothorax is noted. Stable right upper lobe airspace opacity is noted concerning for pneumonia. Minimal bibasilar subsegmental atelectasis or scarring is noted. Stable elevated right hemidiaphragm. Bony thorax unremarkable. IMPRESSION: Stable right upper lobe airspace opacity is noted concerning for pneumonia. Minimal bibasilar subsegmental atelectasis. Electronically Signed   By: Marijo Conception M.D.   On: 04/16/2021 08:28   DG CHEST PORT 1 VIEW  Result Date: 04/15/2021 CLINICAL DATA:  Shortness of breath. EXAM: PORTABLE CHEST 1 VIEW COMPARISON:  04/13/2021 and 08/24/2020 FINDINGS: Stable appearance of dual-chamber cardiac pacemaker. Elevation of the right hemidiaphragm. Scattered parenchymal lung densities, right side greater than left, and minimally changed. Heart size is stable. Evidence of pericardial calcifications. Stable pleural-based densities in the lateral right chest. IMPRESSION: 1. Stable chest radiograph findings. 2. Minimal change in the bilateral parenchymal lung densities, right side greater than left. Findings likely represent acute or chronic disease. Suspect right pleural fluid based on previous CT findings. 3. Stable appearance of the heart with a dual-chamber cardiac pacemaker and pericardial calcifications. Electronically Signed   By: Markus Daft M.D.   On: 04/15/2021 10:55   DG CHEST PORT 1 VIEW  Result Date: 04/13/2021 CLINICAL DATA:  Shortness of breath EXAM: PORTABLE CHEST 1 VIEW COMPARISON:  Yesterday FINDINGS: Dual lead pacer. Midline trachea. Mild cardiomegaly. Chronic calcific pericarditis. Moderate right hemidiaphragm elevation. No pleural effusion or pneumothorax. Diffuse interstitial lung disease. Worsened right upper and right infrahilar airspace disease. Similar left lower lung airspace disease. IMPRESSION: Slightly worsened right-sided aeration, suspicious for pneumonia superimposed upon interstitial lung disease. No change in left lower lobe airspace  disease. Aortic Atherosclerosis (ICD10-I70.0). Electronically Signed   By: Abigail Miyamoto M.D.   On: 04/13/2021 09:02   DG CHEST PORT 1 VIEW  Result Date: 04/12/2021 CLINICAL DATA:  Shortness of breath. EXAM: PORTABLE CHEST 1 VIEW COMPARISON:  04/11/2021.  Chest CTA dated 04/09/2021. FINDINGS: Stable mildly enlarged cardiac silhouette and tortuous and calcified thoracic aorta. Stable left subclavian bipolar pacemaker leads. Stable dense pericardial calcifications. Stable diffuse prominence of the interstitial markings. Interval mild patchy opacity in the left lower lung zone and right upper lobe with  associated mild right upper lobe volume loss. Diffuse osteopenia. IMPRESSION: 1. Interval patchy atelectasis or pneumonia in the left lower lung zone. 2. Interval mild right upper lobe atelectasis. 3. Stable chronic interstitial lung disease. 4. Stable changes of calcific pericarditis. Electronically Signed   By: Claudie Revering M.D.   On: 04/12/2021 11:51   DG CHEST PORT 1 VIEW  Result Date: 04/11/2021 CLINICAL DATA:  Shortness of breath EXAM: PORTABLE CHEST 1 VIEW COMPARISON:  04/10/2021 FINDINGS: Left-sided implanted cardiac device remains in place. Stable cardiomediastinal contours. Aortic atherosclerosis. Diffuse interstitial opacities persist, not appreciably changed from prior. No pleural effusion. No pneumothorax. IMPRESSION: No significant interval change in diffuse interstitial opacities. Electronically Signed   By: Davina Poke D.O.   On: 04/11/2021 09:29   DG Chest Port 1 View  Result Date: 04/09/2021 CLINICAL DATA:  80 year old male with shortness of breath. Recently diagnosed with pneumonia. History of lung cancer and interstitial lung disease. EXAM: PORTABLE CHEST 1 VIEW COMPARISON:  Chest CT 03/10/2021 and earlier. FINDINGS: Portable AP upright view at 1106 hours. Lower lung volumes. Stable cardiac size and mediastinal contours. Chronic pericardial calcification. Stable left chest dual lead  cardiac pacemaker. Increased vague right upper lobe and streaky left lung base opacity from earlier this year. Small pleural effusions seen by CT last month likely persist. No pneumothorax. Lung markings elsewhere appears stable. IMPRESSION: Chronic lung disease with lower lung volumes. Increased right upper lobe and left lung base opacity is nonspecific but could indicate acute infectious exacerbation. Query signs/symptoms of pneumonia. And small pleural effusion seen by CT last month likely persist. Electronically Signed   By: Genevie Ann M.D.   On: 04/09/2021 11:33   ECHOCARDIOGRAM COMPLETE  Result Date: 04/13/2021    ECHOCARDIOGRAM REPORT   Patient Name:   Alexander Murray Date of Exam: 04/13/2021 Medical Rec #:  [id number]      Height:       69.0 in Accession #:    [id number]     Weight:       154.8 lb Date of Birth:  1941/03/17       BSA:          1.853 m Patient Age:    89 years       BP:           106/69 mmHg Patient Gender: M              HR:           77 bpm. Exam Location:  Inpatient Procedure: 2D Echo, Cardiac Doppler and Color Doppler Indications:    dyspnea  History:        Patient has prior history of Echocardiogram examinations, most                 recent 08/14/2020. Pacemaker; Arrythmias:Atrial Fibrillation.  Sonographer:    MH Referring Phys: [id number] Georgina Quint LATIF [place]  1. Left ventricular ejection fraction, by estimation, is 60 to 65%. The left ventricle has normal function. The left ventricle has no regional wall motion abnormalities. Left ventricular diastolic parameters are consistent with Grade I diastolic dysfunction (impaired relaxation).  2. Right ventricular systolic function is normal. The right ventricular size is normal. Tricuspid regurgitation signal is inadequate for assessing PA pressure.  3. Left atrial size was moderately dilated.  4. Right atrial size was moderately dilated.  5. The mitral valve is normal in structure. No evidence of mitral valve regurgitation. No evidence  of mitral stenosis.  6. The  aortic valve is tricuspid. Aortic valve regurgitation is not visualized. No aortic stenosis is present.  7. Aortic dilatation noted. There is mild dilatation of the aortic root, measuring 42 mm.  8. The inferior vena cava is dilated in size with >50% respiratory variability, suggesting right atrial pressure of 8 mmHg. FINDINGS  Left Ventricle: Left ventricular ejection fraction, by estimation, is 60 to 65%. The left ventricle has normal function. The left ventricle has no regional wall motion abnormalities. The left ventricular internal cavity size was normal in size. There is  no left ventricular hypertrophy. Left ventricular diastolic parameters are consistent with Grade I diastolic dysfunction (impaired relaxation). Right Ventricle: The right ventricular size is normal. No increase in right ventricular wall thickness. Right ventricular systolic function is normal. Tricuspid regurgitation signal is inadequate for assessing PA pressure. The tricuspid regurgitant velocity is 2.52 m/s, and with an assumed right atrial pressure of 8 mmHg, the estimated right ventricular systolic pressure is 17.0 mmHg. Left Atrium: Left atrial size was moderately dilated. Right Atrium: Right atrial size was moderately dilated. Pericardium: There is no evidence of pericardial effusion. Mitral Valve: The mitral valve is normal in structure. Mild mitral annular calcification. No evidence of mitral valve regurgitation. No evidence of mitral valve stenosis. Tricuspid Valve: The tricuspid valve is normal in structure. Tricuspid valve regurgitation is mild . No evidence of tricuspid stenosis. Aortic Valve: The aortic valve is tricuspid. Aortic valve regurgitation is not visualized. No aortic stenosis is present. Aortic valve mean gradient measures 3.0 mmHg. Aortic valve peak gradient measures 5.0 mmHg. Aortic valve area, by VTI measures 1.52 cm. Pulmonic Valve: The pulmonic valve was normal in structure. Pulmonic  valve regurgitation is not visualized. No evidence of pulmonic stenosis. Aorta: Aortic dilatation noted. There is mild dilatation of the aortic root, measuring 42 mm. Venous: The inferior vena cava is dilated in size with greater than 50% respiratory variability, suggesting right atrial pressure of 8 mmHg. IAS/Shunts: There is left bowing of the interatrial septum, suggestive of elevated right atrial pressure. The interatrial septum was not well visualized. Additional Comments: A device lead is visualized.  LEFT VENTRICLE PLAX 2D LVIDd:         3.50 cm     Diastology LVIDs:         2.30 cm     LV e' medial:    6.20 cm/s LV PW:         1.00 cm     LV E/e' medial:  9.3 LV IVS:        1.60 cm     LV e' lateral:   8.05 cm/s LVOT diam:     1.90 cm     LV E/e' lateral: 7.2 LV SV:         39 LV SV Index:   21 LVOT Area:     2.84 cm  LV Volumes (MOD) LV vol d, MOD A4C: 40.5 ml LV vol s, MOD A4C: 13.4 ml LV SV MOD A4C:     40.5 ml RIGHT VENTRICLE            IVC RV S prime:     9.03 cm/s  IVC diam: 2.70 cm TAPSE (M-mode): 1.4 cm LEFT ATRIUM           Index       RIGHT ATRIUM           Index LA diam:      3.80 cm 2.05 cm/m  RA Area:  17.00 cm LA Vol (A2C): 50.1 ml 27.04 ml/m RA Volume:   40.20 ml  21.70 ml/m LA Vol (A4C): 50.7 ml 27.37 ml/m  AORTIC VALVE AV Area (Vmax):    1.70 cm AV Area (Vmean):   1.69 cm AV Area (VTI):     1.52 cm AV Vmax:           112.00 cm/s AV Vmean:          84.300 cm/s AV VTI:            0.255 m AV Peak Grad:      5.0 mmHg AV Mean Grad:      3.0 mmHg LVOT Vmax:         67.20 cm/s LVOT Vmean:        50.300 cm/s LVOT VTI:          0.137 m LVOT/AV VTI ratio: 0.54  AORTA Ao Root diam: 3.70 cm Ao Asc diam:  4.20 cm MITRAL VALVE               TRICUSPID VALVE MV Area (PHT): 3.44 cm    TR Peak grad:   25.4 mmHg MV E velocity: 57.80 cm/s  TR Vmax:        252.00 cm/s MV A velocity: 53.10 cm/s MV E/A ratio:  1.09        SHUNTS                            Systemic VTI:  0.14 m                             Systemic Diam: 1.90 cm Kirk Ruths MD Electronically signed by Kirk Ruths MD Signature Date/Time: 04/13/2021/1:50:53 PM    Final    CUP PACEART REMOTE DEVICE CHECK  Result Date: 03/25/2021 Scheduled remote reviewed. Normal device function.  Next remote 91 days. LR  IR THORACENTESIS ASP PLEURAL SPACE W/IMG GUIDE  Result Date: 04/10/2021 INDICATION: Patient with a history of acute on chronic heart failure and small cell lung cancer presents today with bilateral pleural effusion. Interventional Radiology asked to perform a therapeutic and diagnostic thoracentesis. EXAM: ULTRASOUND GUIDED THORACENTESIS MEDICATIONS: 1% lidocaine 10 mL COMPLICATIONS: None immediate. PROCEDURE: An ultrasound guided thoracentesis was thoroughly discussed with the patient and questions answered. The benefits, risks, alternatives and complications were also discussed. The patient understands and wishes to proceed with the procedure. Written consent was obtained. Ultrasound was performed to localize and mark an adequate pocket of fluid in the right chest. The area was then prepped and draped in the normal sterile fashion. 1% Lidocaine was used for local anesthesia. Under ultrasound guidance a 6 Fr Safe-T-Centesis catheter was introduced. Thoracentesis was performed. The catheter was removed and a dressing applied. FINDINGS: A total of approximately 650 mL of amber-colored fluid was removed. Samples were sent to the laboratory as requested by the clinical team. IMPRESSION: Successful ultrasound guided right thoracentesis yielding 650 mL of pleural fluid. Read by: Soyla Dryer, NP Electronically Signed   By: Ruthann Cancer M.D.   On: 04/10/2021 11:32    Assessment/Plan 1. Acute respiratory failure with hypoxia ([name]) Improved after hospitalization but continues with some sob on exertion. Will be working with therapy in rehab to improve his functional satus. Multifactorial causes which include fluid overload vs infection vs  possible ILD (unclear cause) Continue singulair, albuterol inhaler, supplemental O2 and 40 mg  prednisone until follow up with pulmonology in 2 weeks. Dr. Silas Flood plan is for a slow taper.    2. Acute on chronic diastolic CHF (congestive heart failure) ([name])Bilateral pleural effusion Continue  Torsemide 20 mg and Metoprolol 12.5 mg and monitoring daily weights     3. Small cell lung cancer, right lower lobe ([place]) Followed by oncologists. Previously underwent wedge resection and completed chemotherapy/radiation   4. Permanent atrial fibrillation ([name]) Continue Tikosyn 250 mcg and Xarelto 20 mg. Followed by cardiology    6. Gastroesophageal reflux disease without esophagitis Continue nexium 20 mg    7. Thoracic ascending aortic aneurysm ([name]) CTA chest with ectasia ascending thoracic aorta measuring 4.2 cm.  TTE with mild dilation aortic root measuring 42 mm Cardiology following   8. History of BPH Continue doxazosin 4 mg   9. Slow transit constipation Continue senna-docusate   10. Mixed hyperlipidemia Continue Atorvastatin 20 mg   11. Moderate protein-calorie malnutrition ([name]) Albumin 2.7 Continue boost supplement BID   12. Cough Continue benzonatate 200 mg   13. Hyperbilirubinemia Chronic with no symptoms Continue to monitor.    Ordered gel cushion for sacral discomfort.   Family/ staff Communication: Communicated with patient   Labs/tests ordered:  CBC with differential and CMP Monday 9/19

## 2021-04-18 DIAGNOSIS — J9601 Acute respiratory failure with hypoxia: Secondary | ICD-10-CM | POA: Diagnosis not present

## 2021-04-18 DIAGNOSIS — R2689 Other abnormalities of gait and mobility: Secondary | ICD-10-CM | POA: Diagnosis not present

## 2021-04-18 DIAGNOSIS — I4821 Permanent atrial fibrillation: Secondary | ICD-10-CM | POA: Diagnosis not present

## 2021-04-18 DIAGNOSIS — I5033 Acute on chronic diastolic (congestive) heart failure: Secondary | ICD-10-CM | POA: Diagnosis not present

## 2021-04-18 DIAGNOSIS — R278 Other lack of coordination: Secondary | ICD-10-CM | POA: Diagnosis not present

## 2021-04-21 ENCOUNTER — Encounter: Payer: Self-pay | Admitting: Adult Health

## 2021-04-21 ENCOUNTER — Non-Acute Institutional Stay (SKILLED_NURSING_FACILITY): Payer: Medicare Other | Admitting: Adult Health

## 2021-04-21 DIAGNOSIS — R2689 Other abnormalities of gait and mobility: Secondary | ICD-10-CM | POA: Diagnosis not present

## 2021-04-21 DIAGNOSIS — R0600 Dyspnea, unspecified: Secondary | ICD-10-CM

## 2021-04-21 DIAGNOSIS — C3431 Malignant neoplasm of lower lobe, right bronchus or lung: Secondary | ICD-10-CM

## 2021-04-21 DIAGNOSIS — I5033 Acute on chronic diastolic (congestive) heart failure: Secondary | ICD-10-CM | POA: Diagnosis not present

## 2021-04-21 DIAGNOSIS — I5032 Chronic diastolic (congestive) heart failure: Secondary | ICD-10-CM

## 2021-04-21 DIAGNOSIS — J9601 Acute respiratory failure with hypoxia: Secondary | ICD-10-CM

## 2021-04-21 DIAGNOSIS — R0609 Other forms of dyspnea: Secondary | ICD-10-CM

## 2021-04-21 DIAGNOSIS — I4821 Permanent atrial fibrillation: Secondary | ICD-10-CM | POA: Diagnosis not present

## 2021-04-21 DIAGNOSIS — R278 Other lack of coordination: Secondary | ICD-10-CM | POA: Diagnosis not present

## 2021-04-21 DIAGNOSIS — R748 Abnormal levels of other serum enzymes: Secondary | ICD-10-CM

## 2021-04-21 DIAGNOSIS — D649 Anemia, unspecified: Secondary | ICD-10-CM | POA: Diagnosis not present

## 2021-04-21 DIAGNOSIS — I10 Essential (primary) hypertension: Secondary | ICD-10-CM | POA: Diagnosis not present

## 2021-04-21 LAB — BASIC METABOLIC PANEL
BUN: 24 — AB (ref 4–21)
CO2: 28 — AB (ref 13–22)
Chloride: 102 (ref 99–108)
Creatinine: 0.8 (ref 0.6–1.3)
Glucose: 93
Potassium: 4.5 (ref 3.4–5.3)
Sodium: 138 (ref 137–147)

## 2021-04-21 LAB — CBC AND DIFFERENTIAL
HCT: 32 — AB (ref 41–53)
Hemoglobin: 10.8 — AB (ref 13.5–17.5)
Platelets: 147 — AB (ref 150–399)
WBC: 6.8

## 2021-04-21 LAB — COMPREHENSIVE METABOLIC PANEL
Albumin: 3.3 — AB (ref 3.5–5.0)
Calcium: 10.2 (ref 8.7–10.7)
Globulin: 2

## 2021-04-21 LAB — HEPATIC FUNCTION PANEL
ALT: 99 — AB (ref 10–40)
AST: 49 — AB (ref 14–40)
Alkaline Phosphatase: 503 — AB (ref 25–125)
Bilirubin, Total: 1

## 2021-04-21 LAB — CBC: RBC: 3.77 — AB (ref 3.87–5.11)

## 2021-04-21 MED ORDER — IPRATROPIUM-ALBUTEROL 0.5-2.5 (3) MG/3ML IN SOLN: 3.0000 mL | Freq: Four times a day (QID) | RESPIRATORY_TRACT | Status: DC

## 2021-04-21 NOTE — Progress Notes (Signed)
Location:   [place] Room Number: [id number] Place of Service:  SNF [phone]) Provider:  Royal Hawthorn, NP  [name], MD  Patient Care Team: [name], MD as PCP - General (Internal Medicine) Deboraha Sprang, MD as PCP - Electrophysiology (Cardiology) Valrie Hart, RN as Oncology Nurse Navigator  Extended Emergency Contact Information Primary Emergency Contact: Dorann Lodge Address: [address]          [place], [place] [address] [name] of [place] Phone: [phone] Mobile Phone: [phone] Relation: Spouse  Code Status:   Goals of care: Advanced Directive information Advanced Directives 04/21/2021  Does Patient Have a Medical Advance Directive? Yes  Type of Paramedic of [place];Living will  Does patient want to make changes to medical advance directive? No - Patient declined  Copy of [place] in Chart? Yes - validated most recent copy scanned in chart (See row information)  Would patient like information on creating a medical advance directive? -     Chief Complaint  Patient presents with   Acute Visit    HPI:  Pt is a 80 y.o. male seen today for an acute visit for dyspnea on exertion. Patient has a past medical history of CHB s/p PPM, atrial fibrillation on Tikosyn/Xarelto, essential hypertension, small cell lung cancer s/p wedge resection 2021 and chemotherapy/radiation to chest and brain (July and August).  He currently resides in rehab after hospitalization (04/09/21-04/16/21) due to progressive shortness of breath. He was treated for acute hypoxic respiratory failure with IV antibiotics vancomycin, cefepime, doxycycline and ceftriaxone, IV steroids Solu-Medrol and transitioned to 40 mg Prednisone.BNP elevated at 336.  CTA chest negative for PE but showed groundglass opacities and interlobular septal thickening on a background of fibrotic changes as well as increased  bilateral pleural effusions Also he was treated for acute on chronic diastolic CHF with IV diuresis and underwent right thoracentesis with 650cc of pleural fluid removed proved to be transudative. He was discharged on torsemide.   He reports that he is progressively more short of breath with exertion when compared to when he first arrived at [facility]. He is on 2 liters but increased to 3 liters with exertion. His sats are dropping into the 80s with exertion now and he "gasps" for air when working with therapy. He recovers with rest. He has a cough with some clear sputum. No fever. CBC and BMP 04/21/21 showed no acute changes but chronically elevated alk phos and liver enzymes. His weight is unchanged and he has no edema. His SBP has been in the 90s so he has held his metoprolol for two days.     Past Surgical History:  Procedure Laterality Date   CARDIOVERSION N/A 07/29/2020   Procedure: CARDIOVERSION;  Surgeon: Jerline Pain, MD;  Location: [facility] ENDOSCOPY;  Service: Cardiovascular;  Laterality: N/A;  NEEDS RAPID COVID TEST MORNING OF   CARDIOVERSION N/A 08/19/2020   Procedure: CARDIOVERSION;  Surgeon: Thayer Headings, MD;  Location: [place];  Service: Cardiovascular;  Laterality: N/A;   CATARACT EXTRACTION, BILATERAL  01/2017   COLONOSCOPY     EP IMPLANTABLE DEVICE N/A 12/19/2014   Procedure: Pacemaker Implant;  Surgeon: Deboraha Sprang, MD;  Location: [place] [facility] LAB;  Service: Cardiovascular;  Laterality: N/A;   ESOPHAGOGASTRODUODENOSCOPY (EGD) WITH ESOPHAGEAL DILATION  X 3   EYE SURGERY Bilateral 2017   INGUINAL HERNIA REPAIR Left 1980's   INSERT / REPLACE / REMOVE PACEMAKER  12/19/2014   INTERCOSTAL  NERVE BLOCK Right 03/11/2020   Procedure: INTERCOSTAL NERVE BLOCK;  Surgeon: Grace Isaac, MD;  Location: [place];  Service: Thoracic;  Laterality: Right;   IR THORACENTESIS ASP PLEURAL SPACE W/IMG GUIDE  04/10/2021   JOINT REPLACEMENT Right 2016   KNEE ARTHROSCOPY Right ~ 1982; ~ [place] Right ~ 2014   "pre-melanoma scapula"   POSTERIOR TIBIAL TENDON REPAIR Left 2012   TONSILLECTOMY  1946   TOTAL KNEE ARTHROPLASTY Right 05/20/2015   Procedure: RIGHT TOTAL KNEE ARTHROPLASTY;  Surgeon: Paralee Cancel, MD;  Location: [facility] ORS;  Service: Orthopedics;  Laterality: Right;   UPPER GASTROINTESTINAL ENDOSCOPY     VIDEO BRONCHOSCOPY N/A 03/11/2020   Procedure: VIDEO BRONCHOSCOPY;  Surgeon: Grace Isaac, MD;  Location: [place];  Service: Thoracic;  Laterality: N/A;   VIDEO BRONCHOSCOPY WITH ENDOBRONCHIAL ULTRASOUND N/A 11/18/2020   Procedure: VIDEO BRONCHOSCOPY WITH ENDOBRONCHIAL ULTRASOUND;  Surgeon: Melrose Nakayama, MD;  Location: [place];  Service: Thoracic;  Laterality: N/A;   XI ROBOTIC ASSISTED THORACOSCOPY- SEGMENTECTOMY Right 03/11/2020   Procedure: XI ROBOTIC ASSISTED THORACOSCOPY-RIGHT SUPERIOR SEGMENTECTOMY WITH NODE SAMPLES;  Surgeon: Grace Isaac, MD;  Location: [place];  Service: Thoracic;  Laterality: Right;    Allergies  Allergen Reactions   Lisinopril Palpitations and Other (See Comments)    Dizziness, also    Allergies as of 04/21/2021       Reactions   Lisinopril Palpitations, Other (See Comments)   Dizziness, also        Medication List        Accurate as of April 21, 2021  4:23 PM. If you have any questions, ask your nurse or doctor.          acetaminophen 500 MG tablet Commonly known as: TYLENOL Take 500-1,000 mg by mouth every 6 (six) hours as needed for moderate pain.   albuterol 108 (90 Base) MCG/ACT inhaler Commonly known as: VENTOLIN HFA Inhale 1 puff into the lungs every 6 (six) hours as needed for wheezing or shortness of breath.   atorvastatin 20 MG tablet Commonly known as: LIPITOR TAKE 1 TABLET(20 MG) BY MOUTH DAILY   benzonatate 200 MG capsule Commonly known as: TESSALON Take 1 capsule (200 mg total) by mouth 3 (three) times daily as needed for cough.   dofetilide 250 MCG capsule Commonly known as:  TIKOSYN Take 1 capsule (250 mcg total) by mouth 2 (two) times daily.   doxazosin 4 MG tablet Commonly known as: CARDURA Take 4 mg by mouth daily.   esomeprazole 20 MG capsule Commonly known as: NEXIUM Take 20 mg by mouth daily before breakfast.   memantine 10 MG tablet Commonly known as: Namenda Take 1 tablet (10 mg total) by mouth 2 (two) times daily.   metoprolol succinate 25 MG 24 hr tablet Commonly known as: TOPROL-XL Take 0.5 tablets (12.5 mg total) by mouth at bedtime.   montelukast 10 MG tablet Commonly known as: SINGULAIR Take 10 mg by mouth every morning.   Pfizer-BioNT COVID-19 Vac-TriS Susp injection Generic drug: COVID-19 mRNA Vac-TriS (Pfizer) Inject into the muscle.   potassium chloride SA 20 MEQ tablet Commonly known as: KLOR-CON Take 2 tablets (40 mEq total) by mouth every other day. Take with torsemide   predniSONE 10 MG tablet Commonly known as: DELTASONE Take 4 tablets (40 mg total) by mouth daily.   PRESERVISION AREDS 2 PO Take 1 capsule by mouth in the morning and at bedtime.   rivaroxaban 20 MG Tabs tablet  Commonly known as: XARELTO Take 1 tablet (20 mg total) by mouth daily with supper.   senna-docusate 8.6-50 MG tablet Commonly known as: Senokot-S Take 1 tablet by mouth at bedtime as needed for mild constipation.   torsemide 20 MG tablet Commonly known as: DEMADEX Take 1 tablet (20 mg total) by mouth every other day.   traZODone 50 MG tablet Commonly known as: DESYREL Take 1 tablet (50 mg total) by mouth at bedtime as needed for sleep.   vitamin C 500 MG tablet Commonly known as: ASCORBIC ACID Take 500 mg by mouth at bedtime.        Review of Systems  Constitutional:  Positive for activity change. Negative for appetite change, chills, diaphoresis, fatigue, fever and unexpected weight change.  HENT:  Negative for congestion, sore throat and trouble swallowing.   Respiratory:  Positive for cough and shortness of breath. Negative  for wheezing and stridor.   Cardiovascular:  Negative for chest pain, palpitations and leg swelling.  Gastrointestinal:  Negative for abdominal distention, abdominal pain, constipation and diarrhea.  Genitourinary:  Negative for difficulty urinating and dysuria.  Musculoskeletal:  Positive for gait problem. Negative for arthralgias, back pain, joint swelling and myalgias.  Neurological:  Positive for weakness. Negative for dizziness, seizures, syncope, facial asymmetry, speech difficulty and headaches.  Hematological:  Negative for adenopathy. Does not bruise/bleed easily.  Psychiatric/Behavioral:  Negative for agitation, behavioral problems and confusion.    Immunization History  Administered Date(s) Administered   Influenza Split 06/16/2011, 05/17/2012, 05/12/2013, 05/03/2014   Influenza, High Dose Seasonal PF 05/01/2017   Influenza, Quadrivalent, Recombinant, Inj, Pf 05/07/2018, 03/30/2019   Influenza,inj,Quad PF,6+ Mos 05/15/2013, 03/30/2016   Influenza-Unspecified 05/03/2014, 05/15/2017   PFIZER Comirnaty(Gray Top)Covid-19 Tri-Sucrose Vaccine 11/19/2020   PFIZER(Purple Top)SARS-COV-2 Vaccination 07/29/2019, 08/09/2019, 11/19/2020   Pneumococcal Conjugate-13 12/01/2013, 04/03/2014   Pneumococcal Polysaccharide-23 06/04/2003, 06/13/2003, 07/23/2010   Td,absorbed, Preservative Free, Adult Use, Lf Unspecified 06/17/2005, 07/23/2010   Zoster Recombinat (Shingrix) 08/13/2017   Zoster, Live 10/02/2007   Pertinent  Health Maintenance Due  Topic Date Due   INFLUENZA VACCINE  05/10/2021 (Originally 03/03/2021)   COLONOSCOPY (Pts 45-23yr Insurance coverage will need to be confirmed)  01/05/2023   No flowsheet data found. Functional Status Survey:    Vitals:   04/21/21 1607  BP: 98/61  Pulse: 72  Resp: 17  Temp: (!) 97 F (36.1 C)  SpO2: 97%  Height: 5' 9"  (1.753 m)   Body mass index is 20.91 kg/m. Physical Exam Vitals reviewed.  Constitutional:      General: He is not in  acute distress.    Appearance: He is not diaphoretic.     Comments: Frail thin  HENT:     Head: Normocephalic and atraumatic.  Neck:     Thyroid: No thyromegaly.     Vascular: No JVD.     Trachea: No tracheal deviation.  Cardiovascular:     Rate and Rhythm: Normal rate and regular rhythm.     Heart sounds: No murmur heard. Pulmonary:     Effort: Pulmonary effort is normal. No respiratory distress.     Breath sounds: Normal breath sounds. No wheezing.     Comments: Slight wheeze to right lower lobe Abdominal:     General: Bowel sounds are normal. There is no distension.     Palpations: Abdomen is soft.     Tenderness: There is no abdominal tenderness.  Musculoskeletal:     Right lower leg: No edema.     Left lower leg:  No edema.  Lymphadenopathy:     Cervical: No cervical adenopathy.  Skin:    General: Skin is warm and dry.  Neurological:     Mental Status: He is alert and oriented to person, place, and time.     Cranial Nerves: No cranial nerve deficit.    Labs reviewed: Recent Labs    04/14/21 0152 04/15/21 0946 04/16/21 0141 04/21/21 0000  NA 132* 133* 134* 138  K 3.9 4.5 4.1 4.5  CL 101 101 96* 102  CO2 25 24 28  28*  GLUCOSE 124* 167* 142*  --   BUN 18 21 31* 24*  CREATININE 0.81 0.76 1.05 0.8  CALCIUM 9.5 10.2 10.1 10.2  MG 1.8 2.1 2.1  --   PHOS 2.4* 2.9 3.3  --    Recent Labs    04/14/21 0152 04/15/21 0946 04/16/21 0141 04/21/21 0000  AST 21 29 41 49*  ALT 25 35 47* 99*  ALKPHOS 424* 465* 440* 503*  BILITOT 1.7* 1.6* 1.6*  --   PROT 5.6* 5.9* 5.8*  --   ALBUMIN 2.5* 2.8* 2.7* 3.3*   Recent Labs    04/14/21 0152 04/15/21 0946 04/16/21 0141 04/21/21 0000  WBC 5.4 6.1 8.5 6.8  NEUTROABS 4.6 5.8 7.9*  --   HGB 10.0* 10.8* 10.4* 10.8*  HCT 31.1* 33.3* 32.2* 32*  MCV 85.2 85.6 85.4  --   PLT 120* 128* 157 147*   Lab Results  Component Value Date   TSH 5.630 (H) 08/23/2020   No results found for: HGBA1C No results found for: CHOL, HDL,  LDLCALC, LDLDIRECT, TRIG, CHOLHDL  Significant Diagnostic Results in last 30 days:  DG Chest 1 View  Result Date: 04/10/2021 CLINICAL DATA:  Status right post thoracentesis EXAM: CHEST  1 VIEW COMPARISON:  Multiple priors FINDINGS: There is a 2 lead implantable cardiac device with leads terminating in the right atrium and right ventricle. Again seen are pericardial calcifications. Improved effusion on the right side. No pneumothorax. There remains bilateral interstitial and pulmonary opacities as seen on recent cross-sectional CT of the chest. No acute osseous abnormality. IMPRESSION: Improved right-sided effusion after right-sided thoracentesis. No pneumothorax. Similar appearance of bilateral pulmonary opacities. Electronically Signed   By: Albin Felling M.D.   On: 04/10/2021 11:25   CT Angio Chest PE W/Cm &/Or Wo Cm  Result Date: 04/09/2021 CLINICAL DATA:  PE suspected, shortness of breath EXAM: CT ANGIOGRAPHY CHEST WITH CONTRAST TECHNIQUE: Multidetector CT imaging of the chest was performed using the standard protocol during bolus administration of intravenous contrast. Multiplanar CT image reconstructions and MIPs were obtained to evaluate the vascular anatomy. CONTRAST:  16m OMNIPAQUE IOHEXOL 350 MG/ML SOLN COMPARISON:  03/10/2021 CT chest FINDINGS: Cardiovascular: Satisfactory opacification of the pulmonary arteries to the segmental level. No evidence of pulmonary embolism. The heart is normal in size. Redemonstrated extensive pericardial calcifications. Aortic atherosclerosis, extending into the aortic arch branch vessels. Three-vessel coronary artery calcifications. Redemonstrated aortic ectasia, measuring up to 4.2 cm. Left chest pacemaker with leads in the right atrium and right ventricle. Reflux of contrast into the IVC and hepatic veins. Mediastinum/Nodes: No enlarged mediastinal, hilar, or axillary lymph nodes. Thyroid gland, trachea, and esophagus demonstrate no significant findings.  Lungs/Pleura: Postoperative changes of wedge resection of the superior segment of the right lower lobe. Increased areas of ground-glass attenuation and septal thickening superimposed on previously noted fibrotic changes. Increased right pleural effusion, large, and left pleural effusion now moderate. No pneumothorax. Upper Abdomen: No acute abnormality. Musculoskeletal:  No chest wall abnormality. No acute or significant osseous findings. Redemonstrated compression deformity of T11. Review of the MIP images confirms the above findings. IMPRESSION: 1. Increased ground-glass opacities and interlobular septal thickening, superimposed on background fibrotic changes, with increased bilateral pleural effusions, concerning for pulmonary edema, although superimposed infection can not be excluded. 2. Reflux of contrast into the hepatic veins, which can be seen in the setting of heart failure. Correlate with BNP. 3. Negative for pulmonary embolism to the level of the segmental arteries. 4. Ectasia of the ascending thoracic aorta, measuring to 4.2 cm in diameter. Recommend annual imaging followup by CTA or MRA. This recommendation follows 2010 ACCF/AHA/AATS/ACR/ASA/SCA/SCAI/SIR/STS/SVM Guidelines for the Diagnosis and Management of Patients with Thoracic Aortic Disease. Circulation. 2010; 121: R975-O832. Aortic aneurysm NOS (ICD10-I71.9) 5.  Aortic Atherosclerosis (ICD10-I70.0). Electronically Signed   By: Merilyn Baba M.D.   On: 04/09/2021 13:37   DG CHEST PORT 1 VIEW  Result Date: 04/16/2021 CLINICAL DATA:  Dyspnea. EXAM: PORTABLE CHEST 1 VIEW COMPARISON:  April 15, 2021. FINDINGS: Stable cardiomegaly. Left-sided pacemaker is unchanged in position. No pneumothorax is noted. Stable right upper lobe airspace opacity is noted concerning for pneumonia. Minimal bibasilar subsegmental atelectasis or scarring is noted. Stable elevated right hemidiaphragm. Bony thorax unremarkable. IMPRESSION: Stable right upper lobe  airspace opacity is noted concerning for pneumonia. Minimal bibasilar subsegmental atelectasis. Electronically Signed   By: Marijo Conception M.D.   On: 04/16/2021 08:28   DG CHEST PORT 1 VIEW  Result Date: 04/15/2021 CLINICAL DATA:  Shortness of breath. EXAM: PORTABLE CHEST 1 VIEW COMPARISON:  04/13/2021 and 08/24/2020 FINDINGS: Stable appearance of dual-chamber cardiac pacemaker. Elevation of the right hemidiaphragm. Scattered parenchymal lung densities, right side greater than left, and minimally changed. Heart size is stable. Evidence of pericardial calcifications. Stable pleural-based densities in the lateral right chest. IMPRESSION: 1. Stable chest radiograph findings. 2. Minimal change in the bilateral parenchymal lung densities, right side greater than left. Findings likely represent acute or chronic disease. Suspect right pleural fluid based on previous CT findings. 3. Stable appearance of the heart with a dual-chamber cardiac pacemaker and pericardial calcifications. Electronically Signed   By: Markus Daft M.D.   On: 04/15/2021 10:55   DG CHEST PORT 1 VIEW  Result Date: 04/13/2021 CLINICAL DATA:  Shortness of breath EXAM: PORTABLE CHEST 1 VIEW COMPARISON:  Yesterday FINDINGS: Dual lead pacer. Midline trachea. Mild cardiomegaly. Chronic calcific pericarditis. Moderate right hemidiaphragm elevation. No pleural effusion or pneumothorax. Diffuse interstitial lung disease. Worsened right upper and right infrahilar airspace disease. Similar left lower lung airspace disease. IMPRESSION: Slightly worsened right-sided aeration, suspicious for pneumonia superimposed upon interstitial lung disease. No change in left lower lobe airspace disease. Aortic Atherosclerosis (ICD10-I70.0). Electronically Signed   By: Abigail Miyamoto M.D.   On: 04/13/2021 09:02   DG CHEST PORT 1 VIEW  Result Date: 04/12/2021 CLINICAL DATA:  Shortness of breath. EXAM: PORTABLE CHEST 1 VIEW COMPARISON:  04/11/2021.  Chest CTA dated  04/09/2021. FINDINGS: Stable mildly enlarged cardiac silhouette and tortuous and calcified thoracic aorta. Stable left subclavian bipolar pacemaker leads. Stable dense pericardial calcifications. Stable diffuse prominence of the interstitial markings. Interval mild patchy opacity in the left lower lung zone and right upper lobe with associated mild right upper lobe volume loss. Diffuse osteopenia. IMPRESSION: 1. Interval patchy atelectasis or pneumonia in the left lower lung zone. 2. Interval mild right upper lobe atelectasis. 3. Stable chronic interstitial lung disease. 4. Stable changes of calcific pericarditis. Electronically Signed  By: Claudie Revering M.D.   On: 04/12/2021 11:51   DG CHEST PORT 1 VIEW  Result Date: 04/11/2021 CLINICAL DATA:  Shortness of breath EXAM: PORTABLE CHEST 1 VIEW COMPARISON:  04/10/2021 FINDINGS: Left-sided implanted cardiac device remains in place. Stable cardiomediastinal contours. Aortic atherosclerosis. Diffuse interstitial opacities persist, not appreciably changed from prior. No pleural effusion. No pneumothorax. IMPRESSION: No significant interval change in diffuse interstitial opacities. Electronically Signed   By: Davina Poke D.O.   On: 04/11/2021 09:29   DG Chest Port 1 View  Result Date: 04/09/2021 CLINICAL DATA:  80 year old male with shortness of breath. Recently diagnosed with pneumonia. History of lung cancer and interstitial lung disease. EXAM: PORTABLE CHEST 1 VIEW COMPARISON:  Chest CT 03/10/2021 and earlier. FINDINGS: Portable AP upright view at 1106 hours. Lower lung volumes. Stable cardiac size and mediastinal contours. Chronic pericardial calcification. Stable left chest dual lead cardiac pacemaker. Increased vague right upper lobe and streaky left lung base opacity from earlier this year. Small pleural effusions seen by CT last month likely persist. No pneumothorax. Lung markings elsewhere appears stable. IMPRESSION: Chronic lung disease with lower lung  volumes. Increased right upper lobe and left lung base opacity is nonspecific but could indicate acute infectious exacerbation. Query signs/symptoms of pneumonia. And small pleural effusion seen by CT last month likely persist. Electronically Signed   By: Genevie Ann M.D.   On: 04/09/2021 11:33   ECHOCARDIOGRAM COMPLETE  Result Date: 04/13/2021    ECHOCARDIOGRAM REPORT   Patient Name:   Alexander Murray Date of Exam: 04/13/2021 Medical Rec #:  [id number]      Height:       69.0 in Accession #:    [id number]     Weight:       154.8 lb Date of Birth:  21-Sep-1940       BSA:          1.853 m Patient Age:    34 years       BP:           106/69 mmHg Patient Gender: M              HR:           77 bpm. Exam Location:  Inpatient Procedure: 2D Echo, Cardiac Doppler and Color Doppler Indications:    dyspnea  History:        Patient has prior history of Echocardiogram examinations, most                 recent 08/14/2020. Pacemaker; Arrythmias:Atrial Fibrillation.  Sonographer:    MH Referring Phys: [id number] Georgina Quint LATIF [place]  1. Left ventricular ejection fraction, by estimation, is 60 to 65%. The left ventricle has normal function. The left ventricle has no regional wall motion abnormalities. Left ventricular diastolic parameters are consistent with Grade I diastolic dysfunction (impaired relaxation).  2. Right ventricular systolic function is normal. The right ventricular size is normal. Tricuspid regurgitation signal is inadequate for assessing PA pressure.  3. Left atrial size was moderately dilated.  4. Right atrial size was moderately dilated.  5. The mitral valve is normal in structure. No evidence of mitral valve regurgitation. No evidence of mitral stenosis.  6. The aortic valve is tricuspid. Aortic valve regurgitation is not visualized. No aortic stenosis is present.  7. Aortic dilatation noted. There is mild dilatation of the aortic root, measuring 42 mm.  8. The inferior vena cava is dilated in size with >50%  respiratory variability,  suggesting right atrial pressure of 8 mmHg. FINDINGS  Left Ventricle: Left ventricular ejection fraction, by estimation, is 60 to 65%. The left ventricle has normal function. The left ventricle has no regional wall motion abnormalities. The left ventricular internal cavity size was normal in size. There is  no left ventricular hypertrophy. Left ventricular diastolic parameters are consistent with Grade I diastolic dysfunction (impaired relaxation). Right Ventricle: The right ventricular size is normal. No increase in right ventricular wall thickness. Right ventricular systolic function is normal. Tricuspid regurgitation signal is inadequate for assessing PA pressure. The tricuspid regurgitant velocity is 2.52 m/s, and with an assumed right atrial pressure of 8 mmHg, the estimated right ventricular systolic pressure is 94.7 mmHg. Left Atrium: Left atrial size was moderately dilated. Right Atrium: Right atrial size was moderately dilated. Pericardium: There is no evidence of pericardial effusion. Mitral Valve: The mitral valve is normal in structure. Mild mitral annular calcification. No evidence of mitral valve regurgitation. No evidence of mitral valve stenosis. Tricuspid Valve: The tricuspid valve is normal in structure. Tricuspid valve regurgitation is mild . No evidence of tricuspid stenosis. Aortic Valve: The aortic valve is tricuspid. Aortic valve regurgitation is not visualized. No aortic stenosis is present. Aortic valve mean gradient measures 3.0 mmHg. Aortic valve peak gradient measures 5.0 mmHg. Aortic valve area, by VTI measures 1.52 cm. Pulmonic Valve: The pulmonic valve was normal in structure. Pulmonic valve regurgitation is not visualized. No evidence of pulmonic stenosis. Aorta: Aortic dilatation noted. There is mild dilatation of the aortic root, measuring 42 mm. Venous: The inferior vena cava is dilated in size with greater than 50% respiratory variability, suggesting  right atrial pressure of 8 mmHg. IAS/Shunts: There is left bowing of the interatrial septum, suggestive of elevated right atrial pressure. The interatrial septum was not well visualized. Additional Comments: A device lead is visualized.  LEFT VENTRICLE PLAX 2D LVIDd:         3.50 cm     Diastology LVIDs:         2.30 cm     LV e' medial:    6.20 cm/s LV PW:         1.00 cm     LV E/e' medial:  9.3 LV IVS:        1.60 cm     LV e' lateral:   8.05 cm/s LVOT diam:     1.90 cm     LV E/e' lateral: 7.2 LV SV:         39 LV SV Index:   21 LVOT Area:     2.84 cm  LV Volumes (MOD) LV vol d, MOD A4C: 40.5 ml LV vol s, MOD A4C: 13.4 ml LV SV MOD A4C:     40.5 ml RIGHT VENTRICLE            IVC RV S prime:     9.03 cm/s  IVC diam: 2.70 cm TAPSE (M-mode): 1.4 cm LEFT ATRIUM           Index       RIGHT ATRIUM           Index LA diam:      3.80 cm 2.05 cm/m  RA Area:     17.00 cm LA Vol (A2C): 50.1 ml 27.04 ml/m RA Volume:   40.20 ml  21.70 ml/m LA Vol (A4C): 50.7 ml 27.37 ml/m  AORTIC VALVE AV Area (Vmax):    1.70 cm AV Area (Vmean):   1.69 cm AV  Area (VTI):     1.52 cm AV Vmax:           112.00 cm/s AV Vmean:          84.300 cm/s AV VTI:            0.255 m AV Peak Grad:      5.0 mmHg AV Mean Grad:      3.0 mmHg LVOT Vmax:         67.20 cm/s LVOT Vmean:        50.300 cm/s LVOT VTI:          0.137 m LVOT/AV VTI ratio: 0.54  AORTA Ao Root diam: 3.70 cm Ao Asc diam:  4.20 cm MITRAL VALVE               TRICUSPID VALVE MV Area (PHT): 3.44 cm    TR Peak grad:   25.4 mmHg MV E velocity: 57.80 cm/s  TR Vmax:        252.00 cm/s MV A velocity: 53.10 cm/s MV E/A ratio:  1.09        SHUNTS                            Systemic VTI:  0.14 m                            Systemic Diam: 1.90 cm Kirk Ruths MD Electronically signed by Kirk Ruths MD Signature Date/Time: 04/13/2021/1:50:53 PM    Final    CUP PACEART REMOTE DEVICE CHECK  Result Date: 03/25/2021 Scheduled remote reviewed. Normal device function.  Next remote 91 days.  LR  IR THORACENTESIS ASP PLEURAL SPACE W/IMG GUIDE  Result Date: 04/10/2021 INDICATION: Patient with a history of acute on chronic heart failure and small cell lung cancer presents today with bilateral pleural effusion. Interventional Radiology asked to perform a therapeutic and diagnostic thoracentesis. EXAM: ULTRASOUND GUIDED THORACENTESIS MEDICATIONS: 1% lidocaine 10 mL COMPLICATIONS: None immediate. PROCEDURE: An ultrasound guided thoracentesis was thoroughly discussed with the patient and questions answered. The benefits, risks, alternatives and complications were also discussed. The patient understands and wishes to proceed with the procedure. Written consent was obtained. Ultrasound was performed to localize and mark an adequate pocket of fluid in the right chest. The area was then prepped and draped in the normal sterile fashion. 1% Lidocaine was used for local anesthesia. Under ultrasound guidance a 6 Fr Safe-T-Centesis catheter was introduced. Thoracentesis was performed. The catheter was removed and a dressing applied. FINDINGS: A total of approximately 650 mL of amber-colored fluid was removed. Samples were sent to the laboratory as requested by the clinical team. IMPRESSION: Successful ultrasound guided right thoracentesis yielding 650 mL of pleural fluid. Read by: Soyla Dryer, NP Electronically Signed   By: Ruthann Cancer M.D.   On: 04/10/2021 11:32    Assessment/Plan  1. Dyspnea on exertion Will re xray to check for recurrence of pleural effusion.  Add Duonebs q 6 but may hold if having worsening tremors  2. Acute respiratory failure with hypoxia ([name]) Remains on 2-3 liters but is desaturating with activity.  ? Etiology , there is concern for radiation fibrosis which may explain why he is not responding to prednisone.  Followed by pulmonary, apt 9/29   3. Chronic diastolic heart failure ([place]) He appears dry on exam with border line low bp Will continue torsemide qod as he is  better off on the dry sideand monitor electrolytes.   4. Small cell lung cancer, right lower lobe [facility]) S/p resection, chemo radiation Followed by Dr. Julien Nordmann  5. Elevated alkaline phosphatase level Trending in the wrong direction with no symptoms Last note from Dr. Julien Nordmann indicated they did not feel there were mets Will need to follow closely.    Family/ staff Communication: discussed with resident  Labs/tests ordered:   CBC and BMP done, add ESR and CXR

## 2021-04-22 ENCOUNTER — Encounter: Payer: Self-pay | Admitting: Internal Medicine

## 2021-04-22 ENCOUNTER — Non-Acute Institutional Stay (SKILLED_NURSING_FACILITY): Payer: Medicare Other | Admitting: Internal Medicine

## 2021-04-22 DIAGNOSIS — C3431 Malignant neoplasm of lower lobe, right bronchus or lung: Secondary | ICD-10-CM

## 2021-04-22 DIAGNOSIS — M6281 Muscle weakness (generalized): Secondary | ICD-10-CM | POA: Diagnosis not present

## 2021-04-22 DIAGNOSIS — K219 Gastro-esophageal reflux disease without esophagitis: Secondary | ICD-10-CM | POA: Diagnosis not present

## 2021-04-22 DIAGNOSIS — E782 Mixed hyperlipidemia: Secondary | ICD-10-CM | POA: Diagnosis not present

## 2021-04-22 DIAGNOSIS — R748 Abnormal levels of other serum enzymes: Secondary | ICD-10-CM

## 2021-04-22 DIAGNOSIS — I5032 Chronic diastolic (congestive) heart failure: Secondary | ICD-10-CM | POA: Diagnosis not present

## 2021-04-22 DIAGNOSIS — I4821 Permanent atrial fibrillation: Secondary | ICD-10-CM

## 2021-04-22 DIAGNOSIS — J90 Pleural effusion, not elsewhere classified: Secondary | ICD-10-CM | POA: Diagnosis not present

## 2021-04-22 DIAGNOSIS — Z87438 Personal history of other diseases of male genital organs: Secondary | ICD-10-CM

## 2021-04-22 DIAGNOSIS — I5033 Acute on chronic diastolic (congestive) heart failure: Secondary | ICD-10-CM | POA: Diagnosis not present

## 2021-04-22 DIAGNOSIS — R0602 Shortness of breath: Secondary | ICD-10-CM | POA: Diagnosis not present

## 2021-04-22 DIAGNOSIS — J9601 Acute respiratory failure with hypoxia: Secondary | ICD-10-CM | POA: Diagnosis not present

## 2021-04-22 DIAGNOSIS — R2689 Other abnormalities of gait and mobility: Secondary | ICD-10-CM | POA: Diagnosis not present

## 2021-04-22 DIAGNOSIS — R278 Other lack of coordination: Secondary | ICD-10-CM | POA: Diagnosis not present

## 2021-04-22 NOTE — Progress Notes (Signed)
Provider:  Veleta Miners MD  Location:   [place] Room Number: 528 Place of Service:  SNF ([phone])  PCP: [name], MD Patient Care Team: [name], MD as PCP - General (Internal Medicine) Deboraha Sprang, MD as PCP - Electrophysiology (Cardiology) Valrie Hart, RN as Oncology Nurse Navigator  Extended Emergency Contact Information Primary Emergency Contact: Dorann Lodge Address: [address]          [place], [place] [address] [name] of [place] Phone: [phone] Mobile Phone: [phone] Relation: Spouse  Code Status: Full Code Goals of Care: Advanced Directive information Advanced Directives 04/22/2021  Does Patient Have a Medical Advance Directive? Yes  Type of Paramedic of [place];Living will  Does patient want to make changes to medical advance directive? No - Patient declined  Copy of [place] in Chart? Yes - validated most recent copy scanned in chart (See row information)  Would patient like information on creating a medical advance directive? -      Chief Complaint  Patient presents with   New Admit To SNF    Admission to Rehab.    HPI: Patient is a 80 y.o. male seen today for admission to Rehab after staying in the Hospital  He was admitted in the hospital from 9/7-9/14 for Acute Hypoxia due to Bilateral Pleural Effusion CHF and ? Radiation Pneumonitis. ? ILD fibrosis  Patient has h/o CHB s/op Pacemaker A Fib on Tikosyn and Xarelto Small cell cancer s/p Wedge dissection s/p Chemo , Radiation   Presented to ED with SOB  Progressive over Few weeks. Treated with Antibiotics including Doxycyline before hospitalization and then Vanco and cefepime  for few days No Improvement Had Pleural effusion Tap showed Transudate  Seen by Pulmonary recommended Solumedrol followed by 40 mg Prednisone for now Also Treated with IV lasix for elevated BNP of  336 Discharged on Home dose of Torsemide   Continues to feel Hypoxic . Sob at rest Unable to do much ADLS due to SOB. Appetite is good. Cough with Thick Sputum though no fever or Chest pain. 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   CHF (congestive heart failure) ([place]) 08/24/2020   Esophageal stricture    GERD (gastroesophageal reflux disease)    Hemochromatosis    Hiatal hernia    Hypercalcemia    Hypercholesteremia    Hypertension    Internal hemorrhoids    Lung cancer ([name])    Osteoarthritis    Palpitations    Persistent atrial fibrillation ([name])    Presence of permanent cardiac pacemaker    hx bradycardia   Retinal artery occlusion, branch    "right eye"   Second degree AV block, Mobitz type II    Skin cancer    right shoulder   Small cell lung cancer, right lower lobe ([place]) 03/12/2020   Stroke ([place]) ~ 2011   "right eye"/ partial blindness   Tubular adenoma of colon    Past Surgical History:  Procedure Laterality Date   CARDIOVERSION N/A  07/29/2020   Procedure: CARDIOVERSION;  Surgeon: Jerline Pain, MD;  Location: [facility] ENDOSCOPY;  Service: Cardiovascular;  Laterality: N/A;  NEEDS RAPID COVID TEST MORNING OF   CARDIOVERSION N/A 08/19/2020   Procedure: CARDIOVERSION;  Surgeon: Thayer Headings, MD;  Location: [facility];  Service: Cardiovascular;  Laterality: N/A;   CATARACT EXTRACTION, BILATERAL  01/2017   COLONOSCOPY     EP IMPLANTABLE DEVICE N/A 12/19/2014   Procedure: Pacemaker Implant;  Surgeon: Deboraha Sprang, MD;  Location: [facility] CV LAB;  Service: Cardiovascular;  Laterality: N/A;    ESOPHAGOGASTRODUODENOSCOPY (EGD) WITH ESOPHAGEAL DILATION  X 3   EYE SURGERY Bilateral 2017   INGUINAL HERNIA REPAIR Left 1980's   INSERT / REPLACE / REMOVE PACEMAKER  12/19/2014   INTERCOSTAL NERVE BLOCK Right 03/11/2020   Procedure: INTERCOSTAL NERVE BLOCK;  Surgeon: Grace Isaac, MD;  Location: [facility];  Service: Thoracic;  Laterality: Right;   IR THORACENTESIS ASP PLEURAL SPACE W/IMG GUIDE  04/10/2021   JOINT REPLACEMENT Right 2016   KNEE ARTHROSCOPY Right ~ 1982; ~ [place] Right ~ 2014   "pre-melanoma scapula"   POSTERIOR TIBIAL TENDON REPAIR Left 2012   TONSILLECTOMY  1946   TOTAL KNEE ARTHROPLASTY Right 05/20/2015   Procedure: RIGHT TOTAL KNEE ARTHROPLASTY;  Surgeon: Paralee Cancel, MD;  Location: [facility] ORS;  Service: Orthopedics;  Laterality: Right;   UPPER GASTROINTESTINAL ENDOSCOPY     VIDEO BRONCHOSCOPY N/A 03/11/2020   Procedure: VIDEO BRONCHOSCOPY;  Surgeon: Grace Isaac, MD;  Location: [facility];  Service: Thoracic;  Laterality: N/A;   VIDEO BRONCHOSCOPY WITH ENDOBRONCHIAL ULTRASOUND N/A 11/18/2020   Procedure: VIDEO BRONCHOSCOPY WITH ENDOBRONCHIAL ULTRASOUND;  Surgeon: Melrose Nakayama, MD;  Location: [facility];  Service: Thoracic;  Laterality: N/A;   XI ROBOTIC ASSISTED THORACOSCOPY- SEGMENTECTOMY Right 03/11/2020   Procedure: XI ROBOTIC ASSISTED THORACOSCOPY-RIGHT SUPERIOR SEGMENTECTOMY WITH NODE SAMPLES;  Surgeon: Grace Isaac, MD;  Location: [place];  Service: Thoracic;  Laterality: Right;    reports that he quit smoking about 58 years ago. His smoking use included cigarettes. He has never used smokeless tobacco. He reports that he does not drink alcohol and does not use drugs. Social History   Socioeconomic History   Marital status: Married    Spouse name: Not on file   Number of children: Not on file   Years of education: Not on file   Highest education level: Not on file  Occupational History   Occupation: retired - Doctor, general practice  Tobacco Use    Smoking status: Former    Years: 3.00    Types: Cigarettes    Quit date: 05/16/1962    Years since quitting: 58.9   Smokeless tobacco: Never   Tobacco comments:    "quit smoking in the 1960's"; was an intermittent smoker   Vaping Use   Vaping Use: Never used  Substance and Sexual Activity   Alcohol use: No    Comment: 12/19/2014 "stopped drinking in ~ 2012"   Drug use: No   Sexual activity: Yes  Other Topics Concern   Not on file  Social History Narrative   Not on file   Social Determinants of Health   Financial Resource Strain: Not on file  Food Insecurity: Not on file  Transportation Needs: Not on file  Physical Activity: Not on file  Stress: Not on file  Social Connections: Not on file  Intimate Partner Violence: Not on file    Functional Status Survey:  Family History  Problem Relation Age of Onset   Colon cancer Mother        dx in her 54s   Heart disease Mother    Alzheimer's disease Mother    Colon cancer Father        dx in his early 12s   Heart disease Father    Prostate cancer Father    Prostate cancer Brother        1/2 brother   Hemochromatosis Brother    Prostate cancer Brother    Esophageal cancer Neg Hx    Pancreatic cancer Neg Hx    Stomach cancer Neg Hx    Liver disease Neg Hx     Health Maintenance  Topic Date Due   TETANUS/TDAP  Never done   Zoster Vaccines- Shingrix (2 of 2) 10/08/2017   COVID-19 Vaccine (4 - Booster) 02/11/2021   INFLUENZA VACCINE  05/10/2021 (Originally 03/03/2021)   COLONOSCOPY (Pts 45-67yrs Insurance coverage will need to be confirmed)  01/05/2023   HPV VACCINES  Aged Out    Allergies  Allergen Reactions   Lisinopril Palpitations and Other (See Comments)    Dizziness, also    Allergies as of 04/22/2021       Reactions   Lisinopril Palpitations, Other (See Comments)   Dizziness, also        Medication List        Accurate as of April 22, 2021  8:55 AM. If you have any questions, ask your nurse  or doctor.          acetaminophen 500 MG tablet Commonly known as: TYLENOL Take 500-1,000 mg by mouth every 6 (six) hours as needed for moderate pain.   albuterol 108 (90 Base) MCG/ACT inhaler Commonly known as: VENTOLIN HFA Inhale 1 puff into the lungs every 6 (six) hours as needed for wheezing or shortness of breath.   atorvastatin 20 MG tablet Commonly known as: LIPITOR TAKE 1 TABLET(20 MG) BY MOUTH DAILY   benzonatate 200 MG capsule Commonly known as: TESSALON Take 1 capsule (200 mg total) by mouth 3 (three) times daily as needed for cough.   dofetilide 250 MCG capsule Commonly known as: TIKOSYN Take 1 capsule (250 mcg total) by mouth 2 (two) times daily.   doxazosin 4 MG tablet Commonly known as: CARDURA Take 4 mg by mouth daily.   esomeprazole 20 MG capsule Commonly known as: NEXIUM Take 20 mg by mouth daily before breakfast.   memantine 10 MG tablet Commonly known as: Namenda Take 1 tablet (10 mg total) by mouth 2 (two) times daily.   metoprolol succinate 25 MG 24 hr tablet Commonly known as: TOPROL-XL Take 0.5 tablets (12.5 mg total) by mouth at bedtime.   montelukast 10 MG tablet Commonly known as: SINGULAIR Take 10 mg by mouth every morning.   Pfizer-BioNT COVID-19 Vac-TriS Susp injection Generic drug: COVID-19 mRNA Vac-TriS (Pfizer) Inject into the muscle.   potassium chloride SA 20 MEQ tablet Commonly known as: KLOR-CON Take 2 tablets (40 mEq total) by mouth every other day. Take with torsemide   predniSONE 10 MG tablet Commonly known as: DELTASONE Take 4 tablets (40 mg total) by mouth daily.   PRESERVISION AREDS 2 PO Take 1 capsule by mouth in the morning and at bedtime.   rivaroxaban 20 MG Tabs tablet Commonly known as: XARELTO Take 1 tablet (20 mg total) by mouth daily with supper.   senna-docusate 8.6-50 MG tablet Commonly known as: Senokot-S Take 1 tablet by mouth at  bedtime as needed for mild constipation.   torsemide 20 MG  tablet Commonly known as: DEMADEX Take 1 tablet (20 mg total) by mouth every other day.   traZODone 50 MG tablet Commonly known as: DESYREL Take 1 tablet (50 mg total) by mouth at bedtime as needed for sleep.   vitamin C 500 MG tablet Commonly known as: ASCORBIC ACID Take 500 mg by mouth at bedtime.        Review of Systems  Constitutional:  Positive for activity change.  HENT: Negative.    Respiratory:  Positive for cough and shortness of breath.   Cardiovascular:  Negative for chest pain and leg swelling.  Gastrointestinal: Negative.   Genitourinary:  Positive for frequency.  Musculoskeletal: Negative.   Skin: Negative.   Neurological: Negative.   Psychiatric/Behavioral: Negative.     Vitals:   04/22/21 0847  BP: 96/62  Pulse: 84  Resp: 18  Temp: (!) 97.5 F (36.4 C)  SpO2: 93%  Weight: 140 lb (63.5 kg)  Height: 5\' 9"  (1.753 m)   Body mass index is 20.67 kg/m. Physical Exam Constitutional: Oriented to person, place, and time. Well-developed and well-nourished.  HENT:  Head: Normocephalic.  Mouth/Throat: Oropharynx is clear and moist.  Eyes: Pupils are equal, round, and reactive to light.  Neck: Neck supple.  Cardiovascular: Normal rate and normal heart sounds.  No murmur heard. Pulmonary/Chest: Effort normal and breath sounds normal. No respiratory distress. No wheezes.  has no rales.  Abdominal: Soft. Bowel sounds are normal. No distension. There is no tenderness. There is no rebound.  Musculoskeletal: No edema.  Lymphadenopathy: none Neurological: Alert and oriented to person, place, and time.  Skin: Skin is warm and dry.  Psychiatric: Normal mood and affect. Behavior is normal. Thought content normal.   Labs reviewed: Basic Metabolic Panel: Recent Labs    04/14/21 0152 04/15/21 0946 04/16/21 0141 04/21/21 0000  NA 132* 133* 134* 138  K 3.9 4.5 4.1 4.5  CL 101 101 96* 102  CO2 25 24 28  28*  GLUCOSE 124* 167* 142*  --   BUN 18 21 31* 24*   CREATININE 0.81 0.76 1.05 0.8  CALCIUM 9.5 10.2 10.1 10.2  MG 1.8 2.1 2.1  --   PHOS 2.4* 2.9 3.3  --    Liver Function Tests: Recent Labs    04/14/21 0152 04/15/21 0946 04/16/21 0141 04/21/21 0000  AST 21 29 41 49*  ALT 25 35 47* 99*  ALKPHOS 424* 465* 440* 503*  BILITOT 1.7* 1.6* 1.6*  --   PROT 5.6* 5.9* 5.8*  --   ALBUMIN 2.5* 2.8* 2.7* 3.3*   No results for input(s): LIPASE, AMYLASE in the last 8760 hours. No results for input(s): AMMONIA in the last 8760 hours. CBC: Recent Labs    04/14/21 0152 04/15/21 0946 04/16/21 0141 04/21/21 0000  WBC 5.4 6.1 8.5 6.8  NEUTROABS 4.6 5.8 7.9*  --   HGB 10.0* 10.8* 10.4* 10.8*  HCT 31.1* 33.3* 32.2* 32*  MCV 85.2 85.6 85.4  --   PLT 120* 128* 157 147*   Cardiac Enzymes: No results for input(s): CKTOTAL, CKMB, CKMBINDEX, TROPONINI in the last 8760 hours. BNP: Invalid input(s): POCBNP No results found for: HGBA1C Lab Results  Component Value Date   TSH 5.630 (H) 08/23/2020   No results found for: VITAMINB12 No results found for: FOLATE Lab Results  Component Value Date   IRON 48 11/06/2020   TIBC 266 11/06/2020   FERRITIN 84 11/06/2020  Imaging and Procedures obtained prior to SNF admission: DG Chest 1 View  Result Date: 04/10/2021 CLINICAL DATA:  Status right post thoracentesis EXAM: CHEST  1 VIEW COMPARISON:  Multiple priors FINDINGS: There is a 2 lead implantable cardiac device with leads terminating in the right atrium and right ventricle. Again seen are pericardial calcifications. Improved effusion on the right side. No pneumothorax. There remains bilateral interstitial and pulmonary opacities as seen on recent cross-sectional CT of the chest. No acute osseous abnormality. IMPRESSION: Improved right-sided effusion after right-sided thoracentesis. No pneumothorax. Similar appearance of bilateral pulmonary opacities. Electronically Signed   By: Albin Felling M.D.   On: 04/10/2021 11:25   CT Angio Chest PE W/Cm  &/Or Wo Cm  Result Date: 04/09/2021 CLINICAL DATA:  PE suspected, shortness of breath EXAM: CT ANGIOGRAPHY CHEST WITH CONTRAST TECHNIQUE: Multidetector CT imaging of the chest was performed using the standard protocol during bolus administration of intravenous contrast. Multiplanar CT image reconstructions and MIPs were obtained to evaluate the vascular anatomy. CONTRAST:  73mL OMNIPAQUE IOHEXOL 350 MG/ML SOLN COMPARISON:  03/10/2021 CT chest FINDINGS: Cardiovascular: Satisfactory opacification of the pulmonary arteries to the segmental level. No evidence of pulmonary embolism. The heart is normal in size. Redemonstrated extensive pericardial calcifications. Aortic atherosclerosis, extending into the aortic arch branch vessels. Three-vessel coronary artery calcifications. Redemonstrated aortic ectasia, measuring up to 4.2 cm. Left chest pacemaker with leads in the right atrium and right ventricle. Reflux of contrast into the IVC and hepatic veins. Mediastinum/Nodes: No enlarged mediastinal, hilar, or axillary lymph nodes. Thyroid gland, trachea, and esophagus demonstrate no significant findings. Lungs/Pleura: Postoperative changes of wedge resection of the superior segment of the right lower lobe. Increased areas of ground-glass attenuation and septal thickening superimposed on previously noted fibrotic changes. Increased right pleural effusion, large, and left pleural effusion now moderate. No pneumothorax. Upper Abdomen: No acute abnormality. Musculoskeletal: No chest wall abnormality. No acute or significant osseous findings. Redemonstrated compression deformity of T11. Review of the MIP images confirms the above findings. IMPRESSION: 1. Increased ground-glass opacities and interlobular septal thickening, superimposed on background fibrotic changes, with increased bilateral pleural effusions, concerning for pulmonary edema, although superimposed infection can not be excluded. 2. Reflux of contrast into the  hepatic veins, which can be seen in the setting of heart failure. Correlate with BNP. 3. Negative for pulmonary embolism to the level of the segmental arteries. 4. Ectasia of the ascending thoracic aorta, measuring to 4.2 cm in diameter. Recommend annual imaging followup by CTA or MRA. This recommendation follows 2010 ACCF/AHA/AATS/ACR/ASA/SCA/SCAI/SIR/STS/SVM Guidelines for the Diagnosis and Management of Patients with Thoracic Aortic Disease. Circulation. 2010; 121: F810-F751. Aortic aneurysm NOS (ICD10-I71.9) 5.  Aortic Atherosclerosis (ICD10-I70.0). Electronically Signed   By: Merilyn Baba M.D.   On: 04/09/2021 13:37   DG Chest Port 1 View  Result Date: 04/09/2021 CLINICAL DATA:  80 year old male with shortness of breath. Recently diagnosed with pneumonia. History of lung cancer and interstitial lung disease. EXAM: PORTABLE CHEST 1 VIEW COMPARISON:  Chest CT 03/10/2021 and earlier. FINDINGS: Portable AP upright view at 1106 hours. Lower lung volumes. Stable cardiac size and mediastinal contours. Chronic pericardial calcification. Stable left chest dual lead cardiac pacemaker. Increased vague right upper lobe and streaky left lung base opacity from earlier this year. Small pleural effusions seen by CT last month likely persist. No pneumothorax. Lung markings elsewhere appears stable. IMPRESSION: Chronic lung disease with lower lung volumes. Increased right upper lobe and left lung base opacity is nonspecific but could indicate  acute infectious exacerbation. Query signs/symptoms of pneumonia. And small pleural effusion seen by CT last month likely persist. Electronically Signed   By: Genevie Ann M.D.   On: 04/09/2021 11:33   IR THORACENTESIS ASP PLEURAL SPACE W/IMG GUIDE  Result Date: 04/10/2021 INDICATION: Patient with a history of acute on chronic heart failure and small cell lung cancer presents today with bilateral pleural effusion. Interventional Radiology asked to perform a therapeutic and diagnostic  thoracentesis. EXAM: ULTRASOUND GUIDED THORACENTESIS MEDICATIONS: 1% lidocaine 10 mL COMPLICATIONS: None immediate. PROCEDURE: An ultrasound guided thoracentesis was thoroughly discussed with the patient and questions answered. The benefits, risks, alternatives and complications were also discussed. The patient understands and wishes to proceed with the procedure. Written consent was obtained. Ultrasound was performed to localize and mark an adequate pocket of fluid in the right chest. The area was then prepped and draped in the normal sterile fashion. 1% Lidocaine was used for local anesthesia. Under ultrasound guidance a 6 Fr Safe-T-Centesis catheter was introduced. Thoracentesis was performed. The catheter was removed and a dressing applied. FINDINGS: A total of approximately 650 mL of amber-colored fluid was removed. Samples were sent to the laboratory as requested by the clinical team. IMPRESSION: Successful ultrasound guided right thoracentesis yielding 650 mL of pleural fluid. Read by: Soyla Dryer, NP Electronically Signed   By: Ruthann Cancer M.D.   On: 04/10/2021 11:32    Assessment/Plan Shortness of breath Chest Xray done here does show Persistnet bilateral Densities Will Try another round of Doxycyline for Superimposed Infection Already on Prednisone And Nebs Follow up with Dr Silas Flood. D/W him today   Chronic diastolic heart failure ([place]) ? Possible Reason for his SOB  Will Change Demadex to QD with Potassium  Hold metoprolol due to low BP Check BNP Echo in the hospital showed EF 60 % Diastolic Dysfunction   Elevated alkaline phosphatase level ? Etiology He is asymptomatic [place] of RUQ area to Rule out Acute Process Can be done  in facility. 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 MCI On Namenda Very High Functional  Family/ staff Communication:   Labs/tests ordered: BMP,Hepatic panel, Beta D Glucan, RUQ [place], BNP in 2 days

## 2021-04-23 DIAGNOSIS — R945 Abnormal results of liver function studies: Secondary | ICD-10-CM | POA: Diagnosis not present

## 2021-04-23 DIAGNOSIS — I5033 Acute on chronic diastolic (congestive) heart failure: Secondary | ICD-10-CM | POA: Diagnosis not present

## 2021-04-23 DIAGNOSIS — I4821 Permanent atrial fibrillation: Secondary | ICD-10-CM | POA: Diagnosis not present

## 2021-04-23 DIAGNOSIS — J9601 Acute respiratory failure with hypoxia: Secondary | ICD-10-CM | POA: Diagnosis not present

## 2021-04-23 DIAGNOSIS — M6281 Muscle weakness (generalized): Secondary | ICD-10-CM | POA: Diagnosis not present

## 2021-04-24 ENCOUNTER — Non-Acute Institutional Stay (SKILLED_NURSING_FACILITY): Payer: Medicare Other | Admitting: Adult Health

## 2021-04-24 ENCOUNTER — Telehealth: Payer: Self-pay | Admitting: Internal Medicine

## 2021-04-24 ENCOUNTER — Encounter: Payer: Self-pay | Admitting: Adult Health

## 2021-04-24 DIAGNOSIS — I5033 Acute on chronic diastolic (congestive) heart failure: Secondary | ICD-10-CM | POA: Diagnosis not present

## 2021-04-24 DIAGNOSIS — I501 Left ventricular failure: Secondary | ICD-10-CM | POA: Diagnosis not present

## 2021-04-24 DIAGNOSIS — C3431 Malignant neoplasm of lower lobe, right bronchus or lung: Secondary | ICD-10-CM

## 2021-04-24 DIAGNOSIS — R0600 Dyspnea, unspecified: Secondary | ICD-10-CM

## 2021-04-24 DIAGNOSIS — J9601 Acute respiratory failure with hypoxia: Secondary | ICD-10-CM | POA: Diagnosis not present

## 2021-04-24 DIAGNOSIS — I2510 Atherosclerotic heart disease of native coronary artery without angina pectoris: Secondary | ICD-10-CM | POA: Diagnosis not present

## 2021-04-24 DIAGNOSIS — D649 Anemia, unspecified: Secondary | ICD-10-CM | POA: Diagnosis not present

## 2021-04-24 DIAGNOSIS — R0609 Other forms of dyspnea: Secondary | ICD-10-CM

## 2021-04-24 DIAGNOSIS — R748 Abnormal levels of other serum enzymes: Secondary | ICD-10-CM

## 2021-04-24 DIAGNOSIS — I10 Essential (primary) hypertension: Secondary | ICD-10-CM | POA: Diagnosis not present

## 2021-04-24 LAB — BASIC METABOLIC PANEL
BUN: 22 — AB (ref 4–21)
CO2: 28 — AB (ref 13–22)
Chloride: 102 (ref 99–108)
Creatinine: 0.8 (ref 0.6–1.3)
Glucose: 80
Potassium: 4.3 (ref 3.4–5.3)
Sodium: 138 (ref 137–147)

## 2021-04-24 LAB — HEPATIC FUNCTION PANEL
ALT: 124 — AB (ref 10–40)
AST: 49 — AB (ref 14–40)
Alkaline Phosphatase: 571 — AB (ref 25–125)
Bilirubin, Total: 1

## 2021-04-24 LAB — COMPREHENSIVE METABOLIC PANEL: Calcium: 10 (ref 8.7–10.7)

## 2021-04-24 LAB — BRAIN NATRIURETIC PEPTIDE: B Natriuretic Peptide: 416

## 2021-04-24 MED ORDER — ALPRAZOLAM 0.5 MG PO TABS: 0.5000 mg | ORAL_TABLET | Freq: Four times a day (QID) | ORAL | 0 refills | Status: AC | PRN

## 2021-04-24 NOTE — Progress Notes (Signed)
Location:  [facility] Room Number: 158-A Place of Service:  SNF [phone]) Provider:  Royal Hawthorn, NP   Patient Care Team: [name], MD as PCP - General (Internal Medicine) Deboraha Sprang, MD as PCP - Electrophysiology (Cardiology) Valrie Hart, RN as Oncology Nurse Navigator  Extended Emergency Contact Information Primary Emergency Contact: Dorann Lodge Address: [address]          [place], [place] [address] [name] of [place] Phone: [phone] Mobile Phone: [phone] Relation: Spouse  Code Status:  DNR Goals of care: Advanced Directive information Advanced Directives 04/24/2021  Does Patient Have a Medical Advance Directive? Yes  Type of Paramedic of [place];Living will  Does patient want to make changes to medical advance directive? No - Patient declined  Copy of [place] in Chart? Yes - validated most recent copy scanned in chart (See row information)  Would patient like information on creating a medical advance directive? -     Chief Complaint  Patient presents with   Acute Visit    Dyspnea on exertion     HPI:  Pt is a 80 y.o. male seen today for an acute visit for DOE.  Patient has a past medical history of CHB s/p PPM, atrial fibrillation on Tikosyn/Xarelto, essential hypertension, small cell lung cancer s/p wedge resection 2021 and chemotherapy/radiation to chest and brain (July and August).  He currently resides in rehab after hospitalization (04/09/21-04/16/21) due to progressive shortness of breath. He was treated for acute hypoxic respiratory failure with IV antibiotics vancomycin, cefepime, doxycycline and ceftriaxone, IV steroids Solu-Medrol and transitioned to 40 mg Prednisone.BNP elevated at 336.  CTA chest negative for PE but showed groundglass opacities and interlobular septal thickening on a background of fibrotic changes as well as increased bilateral  pleural effusions Also he was treated for acute on chronic diastolic CHF with IV diuresis and underwent right thoracentesis with 650cc of pleural fluid removed proved to be transudative. He was discharged on torsemide.   Update: since arriving he has progressively declined with increased oxygen needs 4 liters with sats 88-90% and feeling more short of breath with exertion. He is even sob when eating. He does not have a fever. He has a cough with clear sputum. He is not able to work with therapy. He has a CXR showing possible pna on 9/19 and was placed on Doxycycline. Beta D glucagon was ordered but won't be back for 5 days. Torsemide was increased to 20 mg qd for fluid overload. He does not feel this has helped his symptoms. His BNP returned at 416. LFTS and Alk phos continue to rise. RUQ ultrasound showed no obstruction, normal liver, and normal size bile duct. It did not visualize the gallbladder  He feels like he is dying and is considering hospice. He does not want to go to the hospital but would like to be treated at [facility].   Past Medical History:  Diagnosis Date   Allergy    Arthritis    "right knee and right thumb" (12/19/2014)   Asthma    pt denies   Atrial fibrillation ([facility]) 2021   Bronchitis 07/2016   started in December and continued about 2 months   Central retinal artery occlusion 06/26/2013   CHF (congestive heart failure) ([facility]) 08/24/2020   Esophageal stricture    GERD (gastroesophageal reflux disease)    Hemochromatosis    Hiatal hernia    Hypercalcemia    Hypercholesteremia  Hypertension    Internal hemorrhoids    Lung cancer ([name])    Osteoarthritis    Palpitations    Persistent atrial fibrillation ([name])    Presence of permanent cardiac pacemaker    hx bradycardia   Retinal artery occlusion, branch    "right eye"   Second degree AV block, Mobitz type II    Skin cancer    right shoulder   Small cell lung cancer, right lower lobe ([facility]) 03/12/2020   Stroke  ([facility]) ~ 2011   "right eye"/ partial blindness   Tubular adenoma of colon    Past Surgical History:  Procedure Laterality Date   CARDIOVERSION N/A 07/29/2020   Procedure: CARDIOVERSION;  Surgeon: Jerline Pain, MD;  Location: [facility];  Service: Cardiovascular;  Laterality: N/A;  NEEDS RAPID COVID TEST MORNING OF   CARDIOVERSION N/A 08/19/2020   Procedure: CARDIOVERSION;  Surgeon: Thayer Headings, MD;  Location: [facility];  Service: Cardiovascular;  Laterality: N/A;   CATARACT EXTRACTION, BILATERAL  01/2017   COLONOSCOPY     EP IMPLANTABLE DEVICE N/A 12/19/2014   Procedure: Pacemaker Implant;  Surgeon: Deboraha Sprang, MD;  Location: [facility];  Service: Cardiovascular;  Laterality: N/A;   ESOPHAGOGASTRODUODENOSCOPY (EGD) WITH ESOPHAGEAL DILATION  X 3   EYE SURGERY Bilateral 2017   INGUINAL HERNIA REPAIR Left 1980's   INSERT / REPLACE / REMOVE PACEMAKER  12/19/2014   INTERCOSTAL NERVE BLOCK Right 03/11/2020   Procedure: INTERCOSTAL NERVE BLOCK;  Surgeon: Grace Isaac, MD;  Location: [facility];  Service: Thoracic;  Laterality: Right;   IR THORACENTESIS ASP PLEURAL SPACE W/IMG GUIDE  04/10/2021   JOINT REPLACEMENT Right 2016   KNEE ARTHROSCOPY Right ~ 1982; ~ [facility] Right ~ 2014   "pre-melanoma scapula"   POSTERIOR TIBIAL TENDON REPAIR Left 2012   TONSILLECTOMY  1946   TOTAL KNEE ARTHROPLASTY Right 05/20/2015   Procedure: RIGHT TOTAL KNEE ARTHROPLASTY;  Surgeon: Paralee Cancel, MD;  Location: [facility];  Service: Orthopedics;  Laterality: Right;   UPPER GASTROINTESTINAL ENDOSCOPY     VIDEO BRONCHOSCOPY N/A 03/11/2020   Procedure: VIDEO BRONCHOSCOPY;  Surgeon: Grace Isaac, MD;  Location: [facility];  Service: Thoracic;  Laterality: N/A;   VIDEO BRONCHOSCOPY WITH ENDOBRONCHIAL ULTRASOUND N/A 11/18/2020   Procedure: VIDEO BRONCHOSCOPY WITH ENDOBRONCHIAL ULTRASOUND;  Surgeon: Melrose Nakayama, MD;  Location: [facility];  Service: Thoracic;  Laterality: N/A;   XI ROBOTIC  ASSISTED THORACOSCOPY- SEGMENTECTOMY Right 03/11/2020   Procedure: XI ROBOTIC ASSISTED THORACOSCOPY-RIGHT SUPERIOR SEGMENTECTOMY WITH NODE SAMPLES;  Surgeon: Grace Isaac, MD;  Location: [facility];  Service: Thoracic;  Laterality: Right;    Allergies  Allergen Reactions   Lisinopril Palpitations and Other (See Comments)    Dizziness, also    Outpatient Encounter Medications as of 04/24/2021  Medication Sig   acetaminophen (TYLENOL) 500 MG tablet Take 1,000 mg by mouth every 6 (six) hours as needed for moderate pain.   albuterol (VENTOLIN HFA) 108 (90 Base) MCG/ACT inhaler Inhale 1 puff into the lungs every 6 (six) hours as needed for wheezing or shortness of breath.   atorvastatin (LIPITOR) 20 MG tablet TAKE 1 TABLET(20 MG) BY MOUTH DAILY   benzonatate (TESSALON) 200 MG capsule Take 1 capsule (200 mg total) by mouth 3 (three) times daily as needed for cough.   dofetilide (TIKOSYN) 250 MCG capsule Take 1 capsule (250 mcg total) by mouth 2 (two) times daily.   doxazosin (CARDURA) 4 MG tablet Take  4 mg by mouth daily.    doxycycline (VIBRA-TABS) 100 MG tablet Take 100 mg by mouth 2 (two) times daily.   esomeprazole (NEXIUM) 20 MG capsule Take 20 mg by mouth daily before breakfast.   feeding supplement (BOOST HIGH PROTEIN) LIQD Take 1 Container by mouth 2 (two) times daily between meals. 07:00 AM - 03:00 PM, 03:00 PM - 11:00 PM   ipratropium-albuterol (DUONEB) 0.5-2.5 (3) MG/3ML SOLN Take 3 mLs by nebulization every 6 (six) hours. Scheduled for shortness of breath   memantine (NAMENDA) 10 MG tablet Take 1 tablet (10 mg total) by mouth 2 (two) times daily.   metoprolol succinate (TOPROL-XL) 25 MG 24 hr tablet Take 0.5 tablets (12.5 mg total) by mouth at bedtime.   montelukast (SINGULAIR) 10 MG tablet Take 10 mg by mouth every morning.    Multiple Vitamins-Minerals (PRESERVISION AREDS 2 PO) Take 1 capsule by mouth in the morning and at bedtime.    OXYGEN Inhale 3-4 L into the lungs as directed. w/  exertion, every Shift; Shift 1, Shift 2, Shift 3   potassium chloride SA (KLOR-CON) 20 MEQ tablet Take 40 mEq by mouth once. In the morning, Holding original sig while on this dose x 7 days   predniSONE (DELTASONE) 10 MG tablet Take 4 tablets (40 mg total) by mouth daily.   rivaroxaban (XARELTO) 20 MG TABS tablet Take 1 tablet (20 mg total) by mouth daily with supper.   senna-docusate (SENOKOT-S) 8.6-50 MG tablet Take 1 tablet by mouth at bedtime as needed for mild constipation.   torsemide (DEMADEX) 20 MG tablet Take 20 mg by mouth daily. In the morning. Holding original sig while on this dose x 7 days   traZODone (DESYREL) 50 MG tablet Take 1 tablet (50 mg total) by mouth at bedtime as needed for sleep.   vitamin C (ASCORBIC ACID) 500 MG tablet Take 500 mg by mouth at bedtime.    COVID-19 mRNA Vac-TriS, Pfizer, (PFIZER-BIONT COVID-19 VAC-TRIS) SUSP injection Inject into the muscle.   potassium chloride SA (KLOR-CON) 20 MEQ tablet Take 2 tablets (40 mEq total) by mouth every other day. Take with torsemide (Patient not taking: Reported on 04/24/2021)   torsemide (DEMADEX) 20 MG tablet Take 1 tablet (20 mg total) by mouth every other day. (Patient not taking: Reported on 04/24/2021)   Facility-Administered Encounter Medications as of 04/24/2021  Medication   ipratropium-albuterol (DUONEB) 0.5-2.5 (3) MG/3ML nebulizer solution 3 mL    Review of Systems  Constitutional:  Positive for activity change and appetite change. Negative for chills, diaphoresis, fatigue, fever and unexpected weight change.  Respiratory:  Positive for cough and shortness of breath. Negative for wheezing and stridor.   Cardiovascular:  Negative for chest pain, palpitations and leg swelling.  Gastrointestinal:  Negative for abdominal distention, abdominal pain, constipation and diarrhea.  Genitourinary:  Negative for difficulty urinating and dysuria.  Musculoskeletal:  Positive for gait problem. Negative for arthralgias, back  pain, joint swelling and myalgias.  Neurological:  Positive for weakness. Negative for dizziness, seizures, syncope, facial asymmetry, speech difficulty and headaches.  Hematological:  Negative for adenopathy. Does not bruise/bleed easily.  Psychiatric/Behavioral:  Negative for agitation, behavioral problems and confusion.    Immunization History  Administered Date(s) Administered   Influenza Split 06/16/2011, 05/17/2012, 05/12/2013, 05/03/2014   Influenza, High Dose Seasonal PF 05/01/2017   Influenza, Quadrivalent, Recombinant, Inj, Pf 05/07/2018, 03/30/2019   Influenza,inj,Quad PF,6+ Mos 05/15/2013, 03/30/2016   Influenza-Unspecified 05/03/2014, 05/15/2017   PFIZER Comirnaty(Gray Top)Covid-19 Tri-Sucrose Vaccine  11/19/2020   PFIZER(Purple Top)SARS-COV-2 Vaccination 07/29/2019, 08/09/2019, 11/19/2020   Pneumococcal Conjugate-13 12/01/2013, 04/03/2014   Pneumococcal Polysaccharide-23 06/04/2003, 06/13/2003, 07/23/2010   Td,absorbed, Preservative Free, Adult Use, Lf Unspecified 06/17/2005, 07/23/2010   Zoster Recombinat (Shingrix) 08/13/2017   Zoster, Live 10/02/2007   Pertinent  Health Maintenance Due  Topic Date Due   INFLUENZA VACCINE  05/10/2021 (Originally 03/03/2021)   COLONOSCOPY (Pts 45-44yr Insurance coverage will need to be confirmed)  01/05/2023   No flowsheet data found. Functional Status Survey:    Vitals:   04/24/21 1420  BP: 120/70  Pulse: 76  Resp: 20  Temp: 97.8 F (36.6 C)  SpO2: 92%  Weight: 143 lb 12.8 oz (65.2 kg)  Height: 5' 9"  (1.753 m)   Body mass index is 21.24 kg/m. Physical Exam Vitals and nursing note reviewed.  Constitutional:      General: He is not in acute distress.    Appearance: He is not diaphoretic.  HENT:     Head: Normocephalic and atraumatic.  Neck:     Thyroid: No thyromegaly.     Vascular: No JVD.     Trachea: No tracheal deviation.  Cardiovascular:     Rate and Rhythm: Normal rate and regular rhythm.     Heart sounds: No  murmur heard. Pulmonary:     Effort: Pulmonary effort is normal. No respiratory distress.     Breath sounds: Rales present. No wheezing.  Abdominal:     General: Bowel sounds are normal. There is no distension.     Palpations: Abdomen is soft.     Tenderness: There is no abdominal tenderness.  Musculoskeletal:     Right lower leg: No edema.     Left lower leg: No edema.  Lymphadenopathy:     Cervical: No cervical adenopathy.  Skin:    General: Skin is warm and dry.  Neurological:     Mental Status: He is alert and oriented to person, place, and time.     Cranial Nerves: No cranial nerve deficit.    Labs reviewed: Recent Labs    04/14/21 0152 04/15/21 0946 04/16/21 0141 04/21/21 0000 04/24/21 0000  NA 132* 133* 134* 138 138  K 3.9 4.5 4.1 4.5 4.3  CL 101 101 96* 102 102  CO2 25 24 28  28* 28*  GLUCOSE 124* 167* 142*  --   --   BUN 18 21 31* 24* 22*  CREATININE 0.81 0.76 1.05 0.8 0.8  CALCIUM 9.5 10.2 10.1 10.2 10.0  MG 1.8 2.1 2.1  --   --   PHOS 2.4* 2.9 3.3  --   --    Recent Labs    04/14/21 0152 04/15/21 0946 04/16/21 0141 04/21/21 0000 04/24/21 0000  AST 21 29 41 49* 49*  ALT 25 35 47* 99* 124*  ALKPHOS 424* 465* 440* 503* 571*  BILITOT 1.7* 1.6* 1.6*  --   --   PROT 5.6* 5.9* 5.8*  --   --   ALBUMIN 2.5* 2.8* 2.7* 3.3*  --    Recent Labs    04/14/21 0152 04/15/21 0946 04/16/21 0141 04/21/21 0000  WBC 5.4 6.1 8.5 6.8  NEUTROABS 4.6 5.8 7.9*  --   HGB 10.0* 10.8* 10.4* 10.8*  HCT 31.1* 33.3* 32.2* 32*  MCV 85.2 85.6 85.4  --   PLT 120* 128* 157 147*   Lab Results  Component Value Date   TSH 5.630 (H) 08/23/2020   No results found for: HGBA1C No results found for: CHOL, HDL, LDLCALC,  LDLDIRECT, TRIG, CHOLHDL  Significant Diagnostic Results in last 30 days:  DG Chest 1 View  Result Date: 04/10/2021 CLINICAL DATA:  Status right post thoracentesis EXAM: CHEST  1 VIEW COMPARISON:  Multiple priors FINDINGS: There is a 2 lead implantable cardiac  device with leads terminating in the right atrium and right ventricle. Again seen are pericardial calcifications. Improved effusion on the right side. No pneumothorax. There remains bilateral interstitial and pulmonary opacities as seen on recent cross-sectional CT of the chest. No acute osseous abnormality. IMPRESSION: Improved right-sided effusion after right-sided thoracentesis. No pneumothorax. Similar appearance of bilateral pulmonary opacities. Electronically Signed   By: Albin Felling M.D.   On: 04/10/2021 11:25   CT Angio Chest PE W/Cm &/Or Wo Cm  Result Date: 04/09/2021 CLINICAL DATA:  PE suspected, shortness of breath EXAM: CT ANGIOGRAPHY CHEST WITH CONTRAST TECHNIQUE: Multidetector CT imaging of the chest was performed using the standard protocol during bolus administration of intravenous contrast. Multiplanar CT image reconstructions and MIPs were obtained to evaluate the vascular anatomy. CONTRAST:  47m OMNIPAQUE IOHEXOL 350 MG/ML SOLN COMPARISON:  03/10/2021 CT chest FINDINGS: Cardiovascular: Satisfactory opacification of the pulmonary arteries to the segmental level. No evidence of pulmonary embolism. The heart is normal in size. Redemonstrated extensive pericardial calcifications. Aortic atherosclerosis, extending into the aortic arch branch vessels. Three-vessel coronary artery calcifications. Redemonstrated aortic ectasia, measuring up to 4.2 cm. Left chest pacemaker with leads in the right atrium and right ventricle. Reflux of contrast into the IVC and hepatic veins. Mediastinum/Nodes: No enlarged mediastinal, hilar, or axillary lymph nodes. Thyroid gland, trachea, and esophagus demonstrate no significant findings. Lungs/Pleura: Postoperative changes of wedge resection of the superior segment of the right lower lobe. Increased areas of ground-glass attenuation and septal thickening superimposed on previously noted fibrotic changes. Increased right pleural effusion, large, and left pleural  effusion now moderate. No pneumothorax. Upper Abdomen: No acute abnormality. Musculoskeletal: No chest wall abnormality. No acute or significant osseous findings. Redemonstrated compression deformity of T11. Review of the MIP images confirms the above findings. IMPRESSION: 1. Increased ground-glass opacities and interlobular septal thickening, superimposed on background fibrotic changes, with increased bilateral pleural effusions, concerning for pulmonary edema, although superimposed infection can not be excluded. 2. Reflux of contrast into the hepatic veins, which can be seen in the setting of heart failure. Correlate with BNP. 3. Negative for pulmonary embolism to the level of the segmental arteries. 4. Ectasia of the ascending thoracic aorta, measuring to 4.2 cm in diameter. Recommend annual imaging followup by CTA or MRA. This recommendation follows 2010 ACCF/AHA/AATS/ACR/ASA/SCA/SCAI/SIR/STS/SVM Guidelines for the Diagnosis and Management of Patients with Thoracic Aortic Disease. Circulation. 2010; 121:: Z610-R604 Aortic aneurysm NOS (ICD10-I71.9) 5.  Aortic Atherosclerosis (ICD10-I70.0). Electronically Signed   By: AMerilyn BabaM.D.   On: 04/09/2021 13:37   DG CHEST PORT 1 VIEW  Result Date: 04/16/2021 CLINICAL DATA:  Dyspnea. EXAM: PORTABLE CHEST 1 VIEW COMPARISON:  April 15, 2021. FINDINGS: Stable cardiomegaly. Left-sided pacemaker is unchanged in position. No pneumothorax is noted. Stable right upper lobe airspace opacity is noted concerning for pneumonia. Minimal bibasilar subsegmental atelectasis or scarring is noted. Stable elevated right hemidiaphragm. Bony thorax unremarkable. IMPRESSION: Stable right upper lobe airspace opacity is noted concerning for pneumonia. Minimal bibasilar subsegmental atelectasis. Electronically Signed   By: JMarijo ConceptionM.D.   On: 04/16/2021 08:28   DG CHEST PORT 1 VIEW  Result Date: 04/15/2021 CLINICAL DATA:  Shortness of breath. EXAM: PORTABLE CHEST 1 VIEW  COMPARISON:  04/13/2021  and 08/24/2020 FINDINGS: Stable appearance of dual-chamber cardiac pacemaker. Elevation of the right hemidiaphragm. Scattered parenchymal lung densities, right side greater than left, and minimally changed. Heart size is stable. Evidence of pericardial calcifications. Stable pleural-based densities in the lateral right chest. IMPRESSION: 1. Stable chest radiograph findings. 2. Minimal change in the bilateral parenchymal lung densities, right side greater than left. Findings likely represent acute or chronic disease. Suspect right pleural fluid based on previous CT findings. 3. Stable appearance of the heart with a dual-chamber cardiac pacemaker and pericardial calcifications. Electronically Signed   By: Markus Daft M.D.   On: 04/15/2021 10:55   DG CHEST PORT 1 VIEW  Result Date: 04/13/2021 CLINICAL DATA:  Shortness of breath EXAM: PORTABLE CHEST 1 VIEW COMPARISON:  Yesterday FINDINGS: Dual lead pacer. Midline trachea. Mild cardiomegaly. Chronic calcific pericarditis. Moderate right hemidiaphragm elevation. No pleural effusion or pneumothorax. Diffuse interstitial lung disease. Worsened right upper and right infrahilar airspace disease. Similar left lower lung airspace disease. IMPRESSION: Slightly worsened right-sided aeration, suspicious for pneumonia superimposed upon interstitial lung disease. No change in left lower lobe airspace disease. Aortic Atherosclerosis (ICD10-I70.0). Electronically Signed   By: Abigail Miyamoto M.D.   On: 04/13/2021 09:02   DG CHEST PORT 1 VIEW  Result Date: 04/12/2021 CLINICAL DATA:  Shortness of breath. EXAM: PORTABLE CHEST 1 VIEW COMPARISON:  04/11/2021.  Chest CTA dated 04/09/2021. FINDINGS: Stable mildly enlarged cardiac silhouette and tortuous and calcified thoracic aorta. Stable left subclavian bipolar pacemaker leads. Stable dense pericardial calcifications. Stable diffuse prominence of the interstitial markings. Interval mild patchy opacity in the  left lower lung zone and right upper lobe with associated mild right upper lobe volume loss. Diffuse osteopenia. IMPRESSION: 1. Interval patchy atelectasis or pneumonia in the left lower lung zone. 2. Interval mild right upper lobe atelectasis. 3. Stable chronic interstitial lung disease. 4. Stable changes of calcific pericarditis. Electronically Signed   By: Claudie Revering M.D.   On: 04/12/2021 11:51   DG CHEST PORT 1 VIEW  Result Date: 04/11/2021 CLINICAL DATA:  Shortness of breath EXAM: PORTABLE CHEST 1 VIEW COMPARISON:  04/10/2021 FINDINGS: Left-sided implanted cardiac device remains in place. Stable cardiomediastinal contours. Aortic atherosclerosis. Diffuse interstitial opacities persist, not appreciably changed from prior. No pleural effusion. No pneumothorax. IMPRESSION: No significant interval change in diffuse interstitial opacities. Electronically Signed   By: Davina Poke D.O.   On: 04/11/2021 09:29   DG Chest Port 1 View  Result Date: 04/09/2021 CLINICAL DATA:  80 year old male with shortness of breath. Recently diagnosed with pneumonia. History of lung cancer and interstitial lung disease. EXAM: PORTABLE CHEST 1 VIEW COMPARISON:  Chest CT 03/10/2021 and earlier. FINDINGS: Portable AP upright view at 1106 hours. Lower lung volumes. Stable cardiac size and mediastinal contours. Chronic pericardial calcification. Stable left chest dual lead cardiac pacemaker. Increased vague right upper lobe and streaky left lung base opacity from earlier this year. Small pleural effusions seen by CT last month likely persist. No pneumothorax. Lung markings elsewhere appears stable. IMPRESSION: Chronic lung disease with lower lung volumes. Increased right upper lobe and left lung base opacity is nonspecific but could indicate acute infectious exacerbation. Query signs/symptoms of pneumonia. And small pleural effusion seen by CT last month likely persist. Electronically Signed   By: Genevie Ann M.D.   On: 04/09/2021  11:33   ECHOCARDIOGRAM COMPLETE  Result Date: 04/13/2021    ECHOCARDIOGRAM REPORT   Patient Name:   JAMA KRICHBAUM Date of Exam: 04/13/2021 Medical Rec #:  [id number]  Height:       69.0 in Accession #:    [id number]     Weight:       154.8 lb Date of Birth:  1941-05-10       BSA:          1.853 m Patient Age:    44 years       BP:           106/69 mmHg Patient Gender: M              HR:           77 bpm. Exam Location:  Inpatient Procedure: 2D Echo, Cardiac Doppler and Color Doppler Indications:    dyspnea  History:        Patient has prior history of Echocardiogram examinations, most                 recent 08/14/2020. Pacemaker; Arrythmias:Atrial Fibrillation.  Sonographer:    MH Referring Phys: [id number] Georgina Quint LATIF [place]  1. Left ventricular ejection fraction, by estimation, is 60 to 65%. The left ventricle has normal function. The left ventricle has no regional wall motion abnormalities. Left ventricular diastolic parameters are consistent with Grade I diastolic dysfunction (impaired relaxation).  2. Right ventricular systolic function is normal. The right ventricular size is normal. Tricuspid regurgitation signal is inadequate for assessing PA pressure.  3. Left atrial size was moderately dilated.  4. Right atrial size was moderately dilated.  5. The mitral valve is normal in structure. No evidence of mitral valve regurgitation. No evidence of mitral stenosis.  6. The aortic valve is tricuspid. Aortic valve regurgitation is not visualized. No aortic stenosis is present.  7. Aortic dilatation noted. There is mild dilatation of the aortic root, measuring 42 mm.  8. The inferior vena cava is dilated in size with >50% respiratory variability, suggesting right atrial pressure of 8 mmHg. FINDINGS  Left Ventricle: Left ventricular ejection fraction, by estimation, is 60 to 65%. The left ventricle has normal function. The left ventricle has no regional wall motion abnormalities. The left ventricular  internal cavity size was normal in size. There is  no left ventricular hypertrophy. Left ventricular diastolic parameters are consistent with Grade I diastolic dysfunction (impaired relaxation). Right Ventricle: The right ventricular size is normal. No increase in right ventricular wall thickness. Right ventricular systolic function is normal. Tricuspid regurgitation signal is inadequate for assessing PA pressure. The tricuspid regurgitant velocity is 2.52 m/s, and with an assumed right atrial pressure of 8 mmHg, the estimated right ventricular systolic pressure is 03.0 mmHg. Left Atrium: Left atrial size was moderately dilated. Right Atrium: Right atrial size was moderately dilated. Pericardium: There is no evidence of pericardial effusion. Mitral Valve: The mitral valve is normal in structure. Mild mitral annular calcification. No evidence of mitral valve regurgitation. No evidence of mitral valve stenosis. Tricuspid Valve: The tricuspid valve is normal in structure. Tricuspid valve regurgitation is mild . No evidence of tricuspid stenosis. Aortic Valve: The aortic valve is tricuspid. Aortic valve regurgitation is not visualized. No aortic stenosis is present. Aortic valve mean gradient measures 3.0 mmHg. Aortic valve peak gradient measures 5.0 mmHg. Aortic valve area, by VTI measures 1.52 cm. Pulmonic Valve: The pulmonic valve was normal in structure. Pulmonic valve regurgitation is not visualized. No evidence of pulmonic stenosis. Aorta: Aortic dilatation noted. There is mild dilatation of the aortic root, measuring 42 mm. Venous: The inferior vena cava is dilated in size with greater than  50% respiratory variability, suggesting right atrial pressure of 8 mmHg. IAS/Shunts: There is left bowing of the interatrial septum, suggestive of elevated right atrial pressure. The interatrial septum was not well visualized. Additional Comments: A device lead is visualized.  LEFT VENTRICLE PLAX 2D LVIDd:         3.50 cm      Diastology LVIDs:         2.30 cm     LV e' medial:    6.20 cm/s LV PW:         1.00 cm     LV E/e' medial:  9.3 LV IVS:        1.60 cm     LV e' lateral:   8.05 cm/s LVOT diam:     1.90 cm     LV E/e' lateral: 7.2 LV SV:         39 LV SV Index:   21 LVOT Area:     2.84 cm  LV Volumes (MOD) LV vol d, MOD A4C: 40.5 ml LV vol s, MOD A4C: 13.4 ml LV SV MOD A4C:     40.5 ml RIGHT VENTRICLE            IVC RV S prime:     9.03 cm/s  IVC diam: 2.70 cm TAPSE (M-mode): 1.4 cm LEFT ATRIUM           Index       RIGHT ATRIUM           Index LA diam:      3.80 cm 2.05 cm/m  RA Area:     17.00 cm LA Vol (A2C): 50.1 ml 27.04 ml/m RA Volume:   40.20 ml  21.70 ml/m LA Vol (A4C): 50.7 ml 27.37 ml/m  AORTIC VALVE AV Area (Vmax):    1.70 cm AV Area (Vmean):   1.69 cm AV Area (VTI):     1.52 cm AV Vmax:           112.00 cm/s AV Vmean:          84.300 cm/s AV VTI:            0.255 m AV Peak Grad:      5.0 mmHg AV Mean Grad:      3.0 mmHg LVOT Vmax:         67.20 cm/s LVOT Vmean:        50.300 cm/s LVOT VTI:          0.137 m LVOT/AV VTI ratio: 0.54  AORTA Ao Root diam: 3.70 cm Ao Asc diam:  4.20 cm MITRAL VALVE               TRICUSPID VALVE MV Area (PHT): 3.44 cm    TR Peak grad:   25.4 mmHg MV E velocity: 57.80 cm/s  TR Vmax:        252.00 cm/s MV A velocity: 53.10 cm/s MV E/A ratio:  1.09        SHUNTS                            Systemic VTI:  0.14 m                            Systemic Diam: 1.90 cm Kirk Ruths MD Electronically signed by Kirk Ruths MD Signature Date/Time: 04/13/2021/1:50:53 PM    Final    IR THORACENTESIS ASP PLEURAL  SPACE W/IMG GUIDE  Result Date: 04/10/2021 INDICATION: Patient with a history of acute on chronic heart failure and small cell lung cancer presents today with bilateral pleural effusion. Interventional Radiology asked to perform a therapeutic and diagnostic thoracentesis. EXAM: ULTRASOUND GUIDED THORACENTESIS MEDICATIONS: 1% lidocaine 10 mL COMPLICATIONS: None immediate. PROCEDURE: An  ultrasound guided thoracentesis was thoroughly discussed with the patient and questions answered. The benefits, risks, alternatives and complications were also discussed. The patient understands and wishes to proceed with the procedure. Written consent was obtained. Ultrasound was performed to localize and mark an adequate pocket of fluid in the right chest. The area was then prepped and draped in the normal sterile fashion. 1% Lidocaine was used for local anesthesia. Under ultrasound guidance a 6 Fr Safe-T-Centesis catheter was introduced. Thoracentesis was performed. The catheter was removed and a dressing applied. FINDINGS: A total of approximately 650 mL of amber-colored fluid was removed. Samples were sent to the laboratory as requested by the clinical team. IMPRESSION: Successful ultrasound guided right thoracentesis yielding 650 mL of pleural fluid. Read by: Soyla Dryer, NP Electronically Signed   By: Ruthann Cancer M.D.   On: 04/10/2021 11:32    Assessment/Plan  1. Dyspnea on exertion Check CXR Discussed his case with Dr. Silas Flood. He feels that fluid overload is the issue.  Increase torsemide to 40 mg bid and Kdur 40 meq qd x 3 days Repeat BMP 48 hrs If he does not respond to diuresis we may consider treatment for PCP pna. Bactrim would interact with Tikosyn so Dr. Silas Flood is researching other options.  - ALPRAZolam (XANAX) 0.5 MG tablet; Take 1 tablet (0.5 mg total) by mouth every 6 (six) hours as needed for anxiety.  Dispense: 30 tablet; Refill: 0  2. Acute respiratory failure with hypoxia ([name]) Increase to 5 liters  Obtain concentrator that can accommodate high flow oxygen and simple mask ?if this is due to volume overload vs radiation fibrosis Continue Duonebs q 6  3. Acute on chronic diastolic CHF (congestive heart failure) ([place]) See #1 BNP 416 Increase torsemide Monitor weight   4. Elevated alkaline phosphatase level With elevated LFTs Unclear etiology  with no  symptoms Negative abd ultrasound ? If this is due to heart failure vs mets  5. Small cell lung cancer, right lower lobe [facility]) S/p resection, chemo, radiation Followed by Dr Delene Ruffini  [name] would like to avoid hospitalizations and feels he may be nearing end of life. If he doesn't respond to these measures we will consider hospice. Xanax was ordered to provide comfort.  Family/ staff Communication: discussed with Dr. Lyndel Safe and Dr Silas Flood  Labs/tests ordered:  BMP in 48 hrs, CXR

## 2021-04-24 NOTE — Telephone Encounter (Signed)
Patient requesting his current situation, x rays, and Dr. Kavin Leech notes in his chart be reviewed by Dr. Caryl Comes. He states if Dr. Caryl Comes thinks it would be beneficial to see an ekg or remote transmission then those can be done.

## 2021-04-24 NOTE — Telephone Encounter (Signed)
Pt reports recent issues with pulmonary fibrosis...Marland KitchenMarland Kitchen currently in [facility] rehab on 4L O2, avg 88-89% sats. He would like Dr. Caryl Comes to review Dr. Kavin Leech notes (pulmonologist) from earlier this month, along with hospital visit. He would like to know if he needs to do anything with cardiology about this matter.   "Does Dr. Caryl Comes think he can be of any benefit". Pt advised to send in manual transmission and current issues began after last transmission. Aware will have Dr. Caryl Comes review. Aware it will be next week before hearing back as MD and RN are out of the office this week. Patient verbalized understanding and agreeable to plan.

## 2021-04-25 ENCOUNTER — Encounter: Payer: Self-pay | Admitting: Adult Health

## 2021-04-25 ENCOUNTER — Non-Acute Institutional Stay (SKILLED_NURSING_FACILITY): Payer: Medicare Other | Admitting: Adult Health

## 2021-04-25 DIAGNOSIS — I5033 Acute on chronic diastolic (congestive) heart failure: Secondary | ICD-10-CM

## 2021-04-25 DIAGNOSIS — J9601 Acute respiratory failure with hypoxia: Secondary | ICD-10-CM | POA: Diagnosis not present

## 2021-04-25 DIAGNOSIS — R748 Abnormal levels of other serum enzymes: Secondary | ICD-10-CM

## 2021-04-25 DIAGNOSIS — C3431 Malignant neoplasm of lower lobe, right bronchus or lung: Secondary | ICD-10-CM

## 2021-04-25 DIAGNOSIS — R0602 Shortness of breath: Secondary | ICD-10-CM

## 2021-04-25 NOTE — Progress Notes (Signed)
This encounter was created in error - please disregard.

## 2021-04-25 NOTE — Telephone Encounter (Signed)
Pt made aware of Kleins response.... He appreciates the return call

## 2021-04-25 NOTE — Progress Notes (Signed)
Location:   [name] Room Number: 158 Place of Service:  SNF [phone]) Provider:  Royal Hawthorn, NP  Alexander Battles, MD  Patient Care Team: Alexander Battles, MD as PCP - General (Internal Medicine) Alexander Sprang, MD as PCP - Electrophysiology (Cardiology) Alexander Hart, RN as Oncology Nurse Navigator  Extended Emergency Contact Information Primary Emergency Contact: Alexander Murray Address: [address]          [name], [name] [address] Alexander Murray of [name] Phone: [phone] Mobile Phone: [phone] Relation: Spouse  Code Status:  Full Code Goals of care: Advanced Directive information Advanced Directives 04/25/2021  Does Patient Have a Medical Advance Directive? Yes  Type of Paramedic of [place];Living will  Does patient want to make changes to medical advance directive? No - Patient declined  Copy of [place] in Chart? Yes - validated most recent copy scanned in chart (See row information)  Would patient like information on creating a medical advance directive? -     Chief Complaint  Patient presents with   Acute Visit    SOB    HPI:  Pt is a 80 y.o. male seen today for an acute visit for sob.  Patient has a past medical history of CHB s/p PPM, atrial fibrillation on Tikosyn/Xarelto, essential hypertension, small cell lung cancer s/p wedge resection 2021 and chemotherapy/radiation to chest and brain (July and August).  He currently resides in rehab after hospitalization (04/09/21-04/16/21) due to progressive shortness of breath. He was treated for acute hypoxic respiratory failure with IV antibiotics vancomycin, cefepime, doxycycline and ceftriaxone, IV steroids Solu-Medrol and transitioned to 40 mg Prednisone.BNP elevated at 336.  CTA chest negative for PE but showed groundglass opacities and interlobular septal thickening on a background of fibrotic changes as well as  increased bilateral pleural effusions Also he was treated for acute on chronic diastolic CHF with IV diuresis and underwent right thoracentesis with 650cc of pleural fluid removed proved to be transudative. He was discharged on torsemide.   Update 04/24/21 : since arriving he has progressively declined with increased oxygen needs 4 liters with sats 88-90% and feeling more short of breath with exertion. He is even sob when eating. He does not have a fever. He has a cough with clear sputum. He is not able to work with therapy. He has a CXR showing possible pna on 9/19 and was placed on Doxycycline. Beta D glucagon was ordered but won't be back for 5 days. Torsemide was increased to 20 mg qd for fluid overload. He does not feel this has helped his symptoms. His BNP returned at 416. LFTS and Alk phos continue to rise. RUQ ultrasound showed no obstruction, normal liver, and normal size bile duct. It did not visualize the gallbladder  Update 04/25/21: Torsemide was increased to 40 mg bid per Dr Alexander Murray. He has diuresed 3600cc over night. Oxygen has been increased to  6 liters. He goes between the mask and the nasal cannula. He does not feel he has improved in terms of shortness of breath. His weight is down 7 lbs. CXR after diuresis mild interstitial densities, shows signs of interstitial pneumonitis R>L. He is using xanax for anxiety and sob and it is helping.  Wt Readings from Last 3 Encounters:  04/25/21 136 lb (61.7 kg)  04/24/21 143 lb 12.8 oz (65.2 kg)  04/22/21 140 lb (63.5 kg)      Past Medical History:  Diagnosis Date   Allergy  Arthritis    "right knee and right thumb" (12/19/2014)   Asthma    pt denies   Atrial fibrillation ([facility]) 2021   Bronchitis 07/2016   started in December and continued about 2 months   Central retinal artery occlusion 06/26/2013   CHF (congestive heart failure) ([facility]) 08/24/2020   Esophageal stricture    GERD (gastroesophageal reflux disease)    Hemochromatosis     Hiatal hernia    Hypercalcemia    Hypercholesteremia    Hypertension    Internal hemorrhoids    Lung cancer ([name])    Osteoarthritis    Palpitations    Persistent atrial fibrillation ([name])    Presence of permanent cardiac pacemaker    hx bradycardia   Retinal artery occlusion, branch    "right eye"   Second degree AV block, Mobitz type II    Skin cancer    right shoulder   Small cell lung cancer, right lower lobe ([name]) 03/12/2020   Stroke ([facility]) ~ 2011   "right eye"/ partial blindness   Tubular adenoma of colon    Past Surgical History:  Procedure Laterality Date   CARDIOVERSION N/A 07/29/2020   Procedure: CARDIOVERSION;  Surgeon: Alexander Pain, MD;  Location: [facility];  Service: Cardiovascular;  Laterality: N/A;  NEEDS RAPID COVID TEST MORNING OF   CARDIOVERSION N/A 08/19/2020   Procedure: CARDIOVERSION;  Surgeon: Alexander Headings, MD;  Location: [facility];  Service: Cardiovascular;  Laterality: N/A;   CATARACT EXTRACTION, BILATERAL  01/2017   COLONOSCOPY     EP IMPLANTABLE DEVICE N/A 12/19/2014   Procedure: Pacemaker Implant;  Surgeon: Alexander Sprang, MD;  Location: [facility];  Service: Cardiovascular;  Laterality: N/A;   ESOPHAGOGASTRODUODENOSCOPY (EGD) WITH ESOPHAGEAL DILATION  X 3   EYE SURGERY Bilateral 2017   INGUINAL HERNIA REPAIR Left 1980's   INSERT / REPLACE / REMOVE PACEMAKER  12/19/2014   INTERCOSTAL NERVE BLOCK Right 03/11/2020   Procedure: INTERCOSTAL NERVE BLOCK;  Surgeon: Alexander Isaac, MD;  Location: [facility];  Service: Thoracic;  Laterality: Right;   IR THORACENTESIS ASP PLEURAL SPACE W/IMG GUIDE  04/10/2021   JOINT REPLACEMENT Right 2016   KNEE ARTHROSCOPY Right ~ 1982; ~ [facility] Right ~ 2014   "pre-melanoma scapula"   POSTERIOR TIBIAL TENDON REPAIR Left 2012   TONSILLECTOMY  1946   TOTAL KNEE ARTHROPLASTY Right 05/20/2015   Procedure: RIGHT TOTAL KNEE ARTHROPLASTY;  Surgeon: Alexander Cancel, MD;  Location: [facility];  Service:  Orthopedics;  Laterality: Right;   UPPER GASTROINTESTINAL ENDOSCOPY     VIDEO BRONCHOSCOPY N/A 03/11/2020   Procedure: VIDEO BRONCHOSCOPY;  Surgeon: Alexander Isaac, MD;  Location: [facility];  Service: Thoracic;  Laterality: N/A;   VIDEO BRONCHOSCOPY WITH ENDOBRONCHIAL ULTRASOUND N/A 11/18/2020   Procedure: VIDEO BRONCHOSCOPY WITH ENDOBRONCHIAL ULTRASOUND;  Surgeon: Melrose Nakayama, MD;  Location: [facility];  Service: Thoracic;  Laterality: N/A;   XI ROBOTIC ASSISTED THORACOSCOPY- SEGMENTECTOMY Right 03/11/2020   Procedure: XI ROBOTIC ASSISTED THORACOSCOPY-RIGHT SUPERIOR SEGMENTECTOMY WITH NODE SAMPLES;  Surgeon: Alexander Isaac, MD;  Location: [facility];  Service: Thoracic;  Laterality: Right;    Allergies  Allergen Reactions   Lisinopril Palpitations and Other (See Comments)    Dizziness, also    Allergies as of 04/25/2021       Reactions   Lisinopril Palpitations, Other (See Comments)   Dizziness, also        Medication List  Accurate as of April 25, 2021 12:11 PM. If you have any questions, ask your nurse or doctor.          STOP taking these medications    metoprolol succinate 25 MG 24 hr tablet Commonly known as: TOPROL-XL Stopped by: Alexander Hawthorn, NP       TAKE these medications    acetaminophen 500 MG tablet Commonly known as: TYLENOL Take 1,000 mg by mouth every 6 (six) hours as needed for moderate Murray.   albuterol 108 (90 Base) MCG/ACT inhaler Commonly known as: VENTOLIN HFA Inhale 1 puff into the lungs every 6 (six) hours as needed for wheezing or shortness of breath.   ALPRAZolam 0.5 MG tablet Commonly known as: XANAX Take 1 tablet (0.5 mg total) by mouth every 6 (six) hours as needed for anxiety.   atorvastatin 20 MG tablet Commonly known as: LIPITOR TAKE 1 TABLET(20 MG) BY MOUTH DAILY   benzonatate 200 MG capsule Commonly known as: TESSALON Take 1 capsule (200 mg total) by mouth 3 (three) times daily as needed for cough.    dofetilide 250 MCG capsule Commonly known as: TIKOSYN Take 1 capsule (250 mcg total) by mouth 2 (two) times daily.   doxazosin 4 MG tablet Commonly known as: CARDURA Take 4 mg by mouth daily.   doxycycline 100 MG tablet Commonly known as: VIBRA-TABS Take 100 mg by mouth 2 (two) times daily.   esomeprazole 20 MG capsule Commonly known as: NEXIUM Take 20 mg by mouth daily before breakfast.   feeding supplement Liqd Take 1 Container by mouth 2 (two) times daily between meals. 07:00 AM - 03:00 PM, 03:00 PM - 11:00 PM   ipratropium-albuterol 0.5-2.5 (3) MG/3ML Soln Commonly known as: DUONEB Take 3 mLs by nebulization every 6 (six) hours. Scheduled for shortness of breath   memantine 10 MG tablet Commonly known as: Namenda Take 1 tablet (10 mg total) by mouth 2 (two) times daily.   montelukast 10 MG tablet Commonly known as: SINGULAIR Take 10 mg by mouth every morning.   OXYGEN Inhale 3-4 L into the lungs as directed. w/ exertion, every Shift; Shift 1, Shift 2, Shift 3   Pfizer-BioNT COVID-19 Vac-TriS Susp injection Generic drug: COVID-19 mRNA Vac-TriS (Pfizer) Inject into the muscle.   potassium chloride SA 20 MEQ tablet Commonly known as: KLOR-CON Take 40 mEq by mouth daily.   potassium chloride SA 20 MEQ tablet Commonly known as: KLOR-CON Take 20 mEq by mouth 2 (two) times daily. Start taking on: April 28, 2021   predniSONE 10 MG tablet Commonly known as: DELTASONE Take 4 tablets (40 mg total) by mouth daily.   PRESERVISION AREDS 2 PO Take 1 capsule by mouth in the morning and at bedtime.   rivaroxaban 20 MG Tabs tablet Commonly known as: XARELTO Take 1 tablet (20 mg total) by mouth daily with supper.   senna-docusate 8.6-50 MG tablet Commonly known as: Senokot-S Take 1 tablet by mouth at bedtime as needed for mild constipation.   torsemide 20 MG tablet Commonly known as: DEMADEX Take 40 mg by mouth 2 (two) times daily.   torsemide 20 MG  tablet Commonly known as: DEMADEX Take 20 mg by mouth daily. Start taking on: April 27, 2021   traZODone 50 MG tablet Commonly known as: DESYREL Take 1 tablet (50 mg total) by mouth at bedtime as needed for sleep.   vitamin C 500 MG tablet Commonly known as: ASCORBIC ACID Take 500 mg by mouth at bedtime.  Review of Systems  Constitutional:  Positive for appetite change and fatigue. Negative for activity change, chills, diaphoresis, fever and unexpected weight change.  Respiratory:  Positive for cough and shortness of breath. Negative for wheezing and stridor.   Cardiovascular:  Negative for chest Murray, palpitations and leg swelling.  Gastrointestinal:  Negative for abdominal distention, abdominal Murray, constipation and diarrhea.  Genitourinary:  Negative for difficulty urinating and dysuria.  Musculoskeletal:  Positive for gait problem. Negative for arthralgias, back Murray, joint swelling and myalgias.  Neurological:  Positive for weakness. Negative for dizziness, seizures, syncope, facial asymmetry, speech difficulty and headaches.  Hematological:  Negative for adenopathy. Does not bruise/bleed easily.  Psychiatric/Behavioral:  Negative for agitation, behavioral problems and confusion.    Immunization History  Administered Date(s) Administered   Influenza Split 06/16/2011, 05/17/2012, 05/12/2013, 05/03/2014   Influenza, High Dose Seasonal PF 05/01/2017   Influenza, Quadrivalent, Recombinant, Inj, Pf 05/07/2018, 03/30/2019   Influenza,inj,Quad PF,6+ Mos 05/15/2013, 03/30/2016   Influenza-Unspecified 05/03/2014, 05/15/2017   PFIZER Comirnaty(Gray Top)Covid-19 Tri-Sucrose Vaccine 11/19/2020   PFIZER(Purple Top)SARS-COV-2 Vaccination 07/29/2019, 08/09/2019, 11/19/2020   Pneumococcal Conjugate-13 12/01/2013, 04/03/2014   Pneumococcal Polysaccharide-23 06/04/2003, 06/13/2003, 07/23/2010   Td,absorbed, Preservative Free, Adult Use, Lf Unspecified 06/17/2005, 07/23/2010    Zoster Recombinat (Shingrix) 08/13/2017   Zoster, Live 10/02/2007   Pertinent  Health Maintenance Due  Topic Date Due   INFLUENZA VACCINE  05/10/2021 (Originally 03/03/2021)   COLONOSCOPY (Pts 45-5yr Insurance coverage will need to be confirmed)  01/05/2023   No flowsheet data found. Functional Status Survey:    Vitals:   04/25/21 0938  BP: 127/74  Pulse: 91  Resp: 20  Temp: 98 F (36.7 C)  SpO2: 91%  Weight: 136 lb (61.7 kg)  Height: 5' 9"  (1.753 m)   Body mass index is 20.08 kg/m. Physical Exam Vitals and nursing note reviewed.  Constitutional:      General: He is not in acute distress.    Appearance: He is not diaphoretic.  HENT:     Head: Normocephalic and atraumatic.  Neck:     Thyroid: No thyromegaly.     Vascular: No JVD.     Trachea: No tracheal deviation.  Cardiovascular:     Rate and Rhythm: Normal rate and regular rhythm.     Heart sounds: No murmur heard. Pulmonary:     Effort: Pulmonary effort is normal. No respiratory distress.     Breath sounds: Rales present. No wheezing.     Comments: Slight increased wob Abdominal:     General: Bowel sounds are normal. There is no distension.     Palpations: Abdomen is soft.     Tenderness: There is no abdominal tenderness.  Musculoskeletal:     Right lower leg: No edema.     Left lower leg: No edema.  Lymphadenopathy:     Cervical: No cervical adenopathy.  Skin:    General: Skin is warm and dry.  Neurological:     Mental Status: He is alert and oriented to person, place, and time.     Cranial Nerves: No cranial nerve deficit.    Labs reviewed: Recent Labs    04/14/21 0152 04/15/21 0946 04/16/21 0141 04/21/21 0000 04/24/21 0000  NA 132* 133* 134* 138 138  K 3.9 4.5 4.1 4.5 4.3  CL 101 101 96* 102 102  CO2 25 24 28  28* 28*  GLUCOSE 124* 167* 142*  --   --   BUN 18 21 31* 24* 22*  CREATININE 0.81 0.76 1.05 0.8  0.8  CALCIUM 9.5 10.2 10.1 10.2 10.0  MG 1.8 2.1 2.1  --   --   PHOS 2.4* 2.9 3.3   --   --    Recent Labs    04/14/21 0152 04/15/21 0946 04/16/21 0141 04/21/21 0000 04/24/21 0000  AST 21 29 41 49* 49*  ALT 25 35 47* 99* 124*  ALKPHOS 424* 465* 440* 503* 571*  BILITOT 1.7* 1.6* 1.6*  --   --   PROT 5.6* 5.9* 5.8*  --   --   ALBUMIN 2.5* 2.8* 2.7* 3.3*  --    Recent Labs    04/14/21 0152 04/15/21 0946 04/16/21 0141 04/21/21 0000  WBC 5.4 6.1 8.5 6.8  NEUTROABS 4.6 5.8 7.9*  --   HGB 10.0* 10.8* 10.4* 10.8*  HCT 31.1* 33.3* 32.2* 32*  MCV 85.2 85.6 85.4  --   PLT 120* 128* 157 147*   Lab Results  Component Value Date   TSH 5.630 (H) 08/23/2020   No results found for: HGBA1C No results found for: CHOL, HDL, LDLCALC, LDLDIRECT, TRIG, CHOLHDL  Significant Diagnostic Results in last 30 days:  DG Chest 1 View  Result Date: 04/10/2021 CLINICAL DATA:  Status right post thoracentesis EXAM: CHEST  1 VIEW COMPARISON:  Multiple priors FINDINGS: There is a 2 lead implantable cardiac device with leads terminating in the right atrium and right ventricle. Again seen are pericardial calcifications. Improved effusion on the right side. No pneumothorax. There remains bilateral interstitial and pulmonary opacities as seen on recent cross-sectional CT of the chest. No acute osseous abnormality. IMPRESSION: Improved right-sided effusion after right-sided thoracentesis. No pneumothorax. Similar appearance of bilateral pulmonary opacities. Electronically Signed   By: Albin Felling M.D.   On: 04/10/2021 11:25   CT Angio Chest PE W/Cm &/Or Wo Cm  Result Date: 04/09/2021 CLINICAL DATA:  PE suspected, shortness of breath EXAM: CT ANGIOGRAPHY CHEST WITH CONTRAST TECHNIQUE: Multidetector CT imaging of the chest was performed using the standard protocol during bolus administration of intravenous contrast. Multiplanar CT image reconstructions and MIPs were obtained to evaluate the vascular anatomy. CONTRAST:  60m OMNIPAQUE IOHEXOL 350 MG/ML SOLN COMPARISON:  03/10/2021 CT chest FINDINGS:  Cardiovascular: Satisfactory opacification of the pulmonary arteries to the segmental level. No evidence of pulmonary embolism. The heart is normal in size. Redemonstrated extensive pericardial calcifications. Aortic atherosclerosis, extending into the aortic arch branch vessels. Three-vessel coronary artery calcifications. Redemonstrated aortic ectasia, measuring up to 4.2 cm. Left chest pacemaker with leads in the right atrium and right ventricle. Reflux of contrast into the IVC and hepatic veins. Mediastinum/Nodes: No enlarged mediastinal, hilar, or axillary lymph nodes. Thyroid gland, trachea, and esophagus demonstrate no significant findings. Lungs/Pleura: Postoperative changes of wedge resection of the superior segment of the right lower lobe. Increased areas of ground-glass attenuation and septal thickening superimposed on previously noted fibrotic changes. Increased right pleural effusion, large, and left pleural effusion now moderate. No pneumothorax. Upper Abdomen: No acute abnormality. Musculoskeletal: No chest wall abnormality. No acute or significant osseous findings. Redemonstrated compression deformity of T11. Review of the MIP images confirms the above findings. IMPRESSION: 1. Increased ground-glass opacities and interlobular septal thickening, superimposed on background fibrotic changes, with increased bilateral pleural effusions, concerning for pulmonary edema, although superimposed infection can not be excluded. 2. Reflux of contrast into the hepatic veins, which can be seen in the setting of heart failure. Correlate with BNP. 3. Negative for pulmonary embolism to the level of the segmental arteries. 4. Ectasia of the  ascending thoracic aorta, measuring to 4.2 cm in diameter. Recommend annual imaging followup by CTA or MRA. This recommendation follows 2010 ACCF/AHA/AATS/ACR/ASA/SCA/SCAI/SIR/STS/SVM Guidelines for the Diagnosis and Management of Patients with Thoracic Aortic Disease. Circulation.  2010; 121: W979-Y801. Aortic aneurysm NOS (ICD10-I71.9) 5.  Aortic Atherosclerosis (ICD10-I70.0). Electronically Signed   By: Merilyn Baba M.D.   On: 04/09/2021 13:37   DG CHEST PORT 1 VIEW  Result Date: 04/16/2021 CLINICAL DATA:  Dyspnea. EXAM: PORTABLE CHEST 1 VIEW COMPARISON:  April 15, 2021. FINDINGS: Stable cardiomegaly. Left-sided pacemaker is unchanged in position. No pneumothorax is noted. Stable right upper lobe airspace opacity is noted concerning for pneumonia. Minimal bibasilar subsegmental atelectasis or scarring is noted. Stable elevated right hemidiaphragm. Bony thorax unremarkable. IMPRESSION: Stable right upper lobe airspace opacity is noted concerning for pneumonia. Minimal bibasilar subsegmental atelectasis. Electronically Signed   By: Marijo Conception M.D.   On: 04/16/2021 08:28   DG CHEST PORT 1 VIEW  Result Date: 04/15/2021 CLINICAL DATA:  Shortness of breath. EXAM: PORTABLE CHEST 1 VIEW COMPARISON:  04/13/2021 and 08/24/2020 FINDINGS: Stable appearance of dual-chamber cardiac pacemaker. Elevation of the right hemidiaphragm. Scattered parenchymal lung densities, right side greater than left, and minimally changed. Heart size is stable. Evidence of pericardial calcifications. Stable pleural-based densities in the lateral right chest. IMPRESSION: 1. Stable chest radiograph findings. 2. Minimal change in the bilateral parenchymal lung densities, right side greater than left. Findings likely represent acute or chronic disease. Suspect right pleural fluid based on previous CT findings. 3. Stable appearance of the heart with a dual-chamber cardiac pacemaker and pericardial calcifications. Electronically Signed   By: Markus Daft M.D.   On: 04/15/2021 10:55   DG CHEST PORT 1 VIEW  Result Date: 04/13/2021 CLINICAL DATA:  Shortness of breath EXAM: PORTABLE CHEST 1 VIEW COMPARISON:  Yesterday FINDINGS: Dual lead pacer. Midline trachea. Mild cardiomegaly. Chronic calcific pericarditis.  Moderate right hemidiaphragm elevation. No pleural effusion or pneumothorax. Diffuse interstitial lung disease. Worsened right upper and right infrahilar airspace disease. Similar left lower lung airspace disease. IMPRESSION: Slightly worsened right-sided aeration, suspicious for pneumonia superimposed upon interstitial lung disease. No change in left lower lobe airspace disease. Aortic Atherosclerosis (ICD10-I70.0). Electronically Signed   By: Abigail Miyamoto M.D.   On: 04/13/2021 09:02   DG CHEST PORT 1 VIEW  Result Date: 04/12/2021 CLINICAL DATA:  Shortness of breath. EXAM: PORTABLE CHEST 1 VIEW COMPARISON:  04/11/2021.  Chest CTA dated 04/09/2021. FINDINGS: Stable mildly enlarged cardiac silhouette and tortuous and calcified thoracic aorta. Stable left subclavian bipolar pacemaker leads. Stable dense pericardial calcifications. Stable diffuse prominence of the interstitial markings. Interval mild patchy opacity in the left lower lung zone and right upper lobe with associated mild right upper lobe volume loss. Diffuse osteopenia. IMPRESSION: 1. Interval patchy atelectasis or pneumonia in the left lower lung zone. 2. Interval mild right upper lobe atelectasis. 3. Stable chronic interstitial lung disease. 4. Stable changes of calcific pericarditis. Electronically Signed   By: Claudie Revering M.D.   On: 04/12/2021 11:51   DG CHEST PORT 1 VIEW  Result Date: 04/11/2021 CLINICAL DATA:  Shortness of breath EXAM: PORTABLE CHEST 1 VIEW COMPARISON:  04/10/2021 FINDINGS: Left-sided implanted cardiac device remains in place. Stable cardiomediastinal contours. Aortic atherosclerosis. Diffuse interstitial opacities persist, not appreciably changed from prior. No pleural effusion. No pneumothorax. IMPRESSION: No significant interval change in diffuse interstitial opacities. Electronically Signed   By: Davina Poke D.O.   On: 04/11/2021 09:29   DG Chest Port 1  View  Result Date: 04/09/2021 CLINICAL DATA:  80 year old  male with shortness of breath. Recently diagnosed with pneumonia. History of lung cancer and interstitial lung disease. EXAM: PORTABLE CHEST 1 VIEW COMPARISON:  Chest CT 03/10/2021 and earlier. FINDINGS: Portable AP upright view at 1106 hours. Lower lung volumes. Stable cardiac size and mediastinal contours. Chronic pericardial calcification. Stable left chest dual lead cardiac pacemaker. Increased vague right upper lobe and streaky left lung base opacity from earlier this year. Small pleural effusions seen by CT last month likely persist. No pneumothorax. Lung markings elsewhere appears stable. IMPRESSION: Chronic lung disease with lower lung volumes. Increased right upper lobe and left lung base opacity is nonspecific but could indicate acute infectious exacerbation. Query signs/symptoms of pneumonia. And small pleural effusion seen by CT last month likely persist. Electronically Signed   By: Genevie Ann M.D.   On: 04/09/2021 11:33   ECHOCARDIOGRAM COMPLETE  Result Date: 04/13/2021    ECHOCARDIOGRAM REPORT   Patient Name:   Alexander Murray Date of Exam: 04/13/2021 Medical Rec #:  [id number]      Height:       69.0 in Accession #:    [id number]     Weight:       154.8 lb Date of Birth:  Oct 16, 1940       BSA:          1.853 m Patient Age:    80 years       BP:           106/69 mmHg Patient Gender: M              HR:           77 bpm. Exam Location:  Inpatient Procedure: 2D Echo, Cardiac Doppler and Color Doppler Indications:    dyspnea  History:        Patient has prior history of Echocardiogram examinations, most                 recent 08/14/2020. Pacemaker; Arrythmias:Atrial Fibrillation.  Sonographer:    MH Referring Phys: [id number] Georgina Quint LATIF [place]  1. Left ventricular ejection fraction, by estimation, is 60 to 65%. The left ventricle has normal function. The left ventricle has no regional wall motion abnormalities. Left ventricular diastolic parameters are consistent with Grade I diastolic dysfunction  (impaired relaxation).  2. Right ventricular systolic function is normal. The right ventricular size is normal. Tricuspid regurgitation signal is inadequate for assessing PA pressure.  3. Left atrial size was moderately dilated.  4. Right atrial size was moderately dilated.  5. The mitral valve is normal in structure. No evidence of mitral valve regurgitation. No evidence of mitral stenosis.  6. The aortic valve is tricuspid. Aortic valve regurgitation is not visualized. No aortic stenosis is present.  7. Aortic dilatation noted. There is mild dilatation of the aortic root, measuring 42 mm.  8. The inferior vena cava is dilated in size with >50% respiratory variability, suggesting right atrial pressure of 8 mmHg. FINDINGS  Left Ventricle: Left ventricular ejection fraction, by estimation, is 60 to 65%. The left ventricle has normal function. The left ventricle has no regional wall motion abnormalities. The left ventricular internal cavity size was normal in size. There is  no left ventricular hypertrophy. Left ventricular diastolic parameters are consistent with Grade I diastolic dysfunction (impaired relaxation). Right Ventricle: The right ventricular size is normal. No increase in right ventricular wall thickness. Right ventricular systolic function is normal. Tricuspid regurgitation signal is  inadequate for assessing PA pressure. The tricuspid regurgitant velocity is 2.52 m/s, and with an assumed right atrial pressure of 8 mmHg, the estimated right ventricular systolic pressure is 63.3 mmHg. Left Atrium: Left atrial size was moderately dilated. Right Atrium: Right atrial size was moderately dilated. Pericardium: There is no evidence of pericardial effusion. Mitral Valve: The mitral valve is normal in structure. Mild mitral annular calcification. No evidence of mitral valve regurgitation. No evidence of mitral valve stenosis. Tricuspid Valve: The tricuspid valve is normal in structure. Tricuspid valve  regurgitation is mild . No evidence of tricuspid stenosis. Aortic Valve: The aortic valve is tricuspid. Aortic valve regurgitation is not visualized. No aortic stenosis is present. Aortic valve mean gradient measures 3.0 mmHg. Aortic valve peak gradient measures 5.0 mmHg. Aortic valve area, by VTI measures 1.52 cm. Pulmonic Valve: The pulmonic valve was normal in structure. Pulmonic valve regurgitation is not visualized. No evidence of pulmonic stenosis. Aorta: Aortic dilatation noted. There is mild dilatation of the aortic root, measuring 42 mm. Venous: The inferior vena cava is dilated in size with greater than 50% respiratory variability, suggesting right atrial pressure of 8 mmHg. IAS/Shunts: There is left bowing of the interatrial septum, suggestive of elevated right atrial pressure. The interatrial septum was not well visualized. Additional Comments: A device lead is visualized.  LEFT VENTRICLE PLAX 2D LVIDd:         3.50 cm     Diastology LVIDs:         2.30 cm     LV e' medial:    6.20 cm/s LV PW:         1.00 cm     LV E/e' medial:  9.3 LV IVS:        1.60 cm     LV e' lateral:   8.05 cm/s LVOT diam:     1.90 cm     LV E/e' lateral: 7.2 LV SV:         39 LV SV Index:   21 LVOT Area:     2.84 cm  LV Volumes (MOD) LV vol d, MOD A4C: 40.5 ml LV vol s, MOD A4C: 13.4 ml LV SV MOD A4C:     40.5 ml RIGHT VENTRICLE            IVC RV S prime:     9.03 cm/s  IVC diam: 2.70 cm TAPSE (M-mode): 1.4 cm LEFT ATRIUM           Index       RIGHT ATRIUM           Index LA diam:      3.80 cm 2.05 cm/m  RA Area:     17.00 cm LA Vol (A2C): 50.1 ml 27.04 ml/m RA Volume:   40.20 ml  21.70 ml/m LA Vol (A4C): 50.7 ml 27.37 ml/m  AORTIC VALVE AV Area (Vmax):    1.70 cm AV Area (Vmean):   1.69 cm AV Area (VTI):     1.52 cm AV Vmax:           112.00 cm/s AV Vmean:          84.300 cm/s AV VTI:            0.255 m AV Peak Grad:      5.0 mmHg AV Mean Grad:      3.0 mmHg LVOT Vmax:         67.20 cm/s LVOT Vmean:  50.300 cm/s  LVOT VTI:          0.137 m LVOT/AV VTI ratio: 0.54  AORTA Ao Root diam: 3.70 cm Ao Asc diam:  4.20 cm MITRAL VALVE               TRICUSPID VALVE MV Area (PHT): 3.44 cm    TR Peak grad:   25.4 mmHg MV E velocity: 57.80 cm/s  TR Vmax:        252.00 cm/s MV A velocity: 53.10 cm/s MV E/A ratio:  1.09        SHUNTS                            Systemic VTI:  0.14 m                            Systemic Diam: 1.90 cm Kirk Ruths MD Electronically signed by Kirk Ruths MD Signature Date/Time: 04/13/2021/1:50:53 PM    Final    IR THORACENTESIS ASP PLEURAL SPACE W/IMG GUIDE  Result Date: 04/10/2021 INDICATION: Patient with a history of acute on chronic heart failure and small cell lung cancer presents today with bilateral pleural effusion. Interventional Radiology asked to perform a therapeutic and diagnostic thoracentesis. EXAM: ULTRASOUND GUIDED THORACENTESIS MEDICATIONS: 1% lidocaine 10 mL COMPLICATIONS: None immediate. PROCEDURE: An ultrasound guided thoracentesis was thoroughly discussed with the patient and questions answered. The benefits, risks, alternatives and complications were also discussed. The patient understands and wishes to proceed with the procedure. Written consent was obtained. Ultrasound was performed to localize and mark an adequate pocket of fluid in the right chest. The area was then prepped and draped in the normal sterile fashion. 1% Lidocaine was used for local anesthesia. Under ultrasound guidance a 6 Fr Safe-T-Centesis catheter was introduced. Thoracentesis was performed. The catheter was removed and a dressing applied. FINDINGS: A total of approximately 650 mL of amber-colored fluid was removed. Samples were sent to the laboratory as requested by the clinical team. IMPRESSION: Successful ultrasound guided right thoracentesis yielding 650 mL of pleural fluid. Read by: Soyla Dryer, NP Electronically Signed   By: Ruthann Cancer M.D.   On: 04/10/2021 11:32    Assessment/Plan  1.  Shortness of breath No improvement with diuresis but xanax helps to alleviate the symptoms.  Unclear if this is due to radiation and another possibility is PCP pneumonia. Discussed with Dr. Silas Murray and [name]. We agreed to try a regimen and monitor for improvement since he did not respond to diuresis. If he is experiencing GI s/e from the regimen we can discontinue. He will take with his family about hospice and his goals of care this weekend when they visit. He was encouraged to take food with this regimen.  Stop Doxycyline  Increase prednisone to 40 mg bid x 5 days then resume 40 mg qd Primaquine 30 mg qd x 21 days Clindamycin 600 mg tid x 21 days Florastor 1 cap bid x 21 days  2. Acute respiratory failure with hypoxia ([name]) No improvement Continue oxygen at 6 liters [place] Continue Duonebs  3. Acute on chronic diastolic CHF (congestive heart failure) ([place]) Well diuresed  Stop torsemide 40 mg bid after today and resume 20 mg qd Stop Kdur 40 meq qd after today and resume 20 meq qd   4. Elevated alkaline phosphatase level With elevated LFTs Unclear etiology  with no symptoms Negative abd ultrasound ?  If this is due to heart failure vs mets  5. Small cell lung cancer, right lower lobe [facility]) S/p resection, chemo, radiation Followed by Dr Delene Ruffini     Family/ staff Communication: discussed with Dr Wonda Olds and Dr Alexander Murray  Labs/tests ordered:   BMP in am Beta D glucagon pending.

## 2021-04-26 DIAGNOSIS — I10 Essential (primary) hypertension: Secondary | ICD-10-CM | POA: Diagnosis not present

## 2021-04-26 LAB — BASIC METABOLIC PANEL
BUN: 31 — AB (ref 4–21)
CO2: 29 — AB (ref 13–22)
Chloride: 97 — AB (ref 99–108)
Creatinine: 1.1 (ref 0.6–1.3)
Glucose: 131
Potassium: 4.3 (ref 3.4–5.3)
Sodium: 139 (ref 137–147)

## 2021-04-26 LAB — COMPREHENSIVE METABOLIC PANEL: Calcium: 10.3 (ref 8.7–10.7)

## 2021-04-28 ENCOUNTER — Non-Acute Institutional Stay (SKILLED_NURSING_FACILITY): Payer: Medicare Other | Admitting: Adult Health

## 2021-04-28 ENCOUNTER — Encounter: Payer: Self-pay | Admitting: Adult Health

## 2021-04-28 DIAGNOSIS — I4821 Permanent atrial fibrillation: Secondary | ICD-10-CM | POA: Diagnosis not present

## 2021-04-28 DIAGNOSIS — R2689 Other abnormalities of gait and mobility: Secondary | ICD-10-CM | POA: Diagnosis not present

## 2021-04-28 DIAGNOSIS — R1011 Right upper quadrant pain: Secondary | ICD-10-CM | POA: Diagnosis not present

## 2021-04-28 DIAGNOSIS — R0602 Shortness of breath: Secondary | ICD-10-CM | POA: Diagnosis not present

## 2021-04-28 DIAGNOSIS — K5901 Slow transit constipation: Secondary | ICD-10-CM

## 2021-04-28 DIAGNOSIS — R748 Abnormal levels of other serum enzymes: Secondary | ICD-10-CM | POA: Diagnosis not present

## 2021-04-28 DIAGNOSIS — I5033 Acute on chronic diastolic (congestive) heart failure: Secondary | ICD-10-CM

## 2021-04-28 DIAGNOSIS — J9601 Acute respiratory failure with hypoxia: Secondary | ICD-10-CM

## 2021-04-28 DIAGNOSIS — C3431 Malignant neoplasm of lower lobe, right bronchus or lung: Secondary | ICD-10-CM

## 2021-04-28 DIAGNOSIS — R278 Other lack of coordination: Secondary | ICD-10-CM | POA: Diagnosis not present

## 2021-04-28 NOTE — Progress Notes (Signed)
Location:   [place] of Service:  SNF (31) Provider:  Royal Hawthorn, NP  [name], MD  Patient Care Team: [name], MD as PCP - General (Internal Medicine) Deboraha Sprang, MD as PCP - Electrophysiology (Cardiology) Valrie Hart, RN as Oncology Nurse Navigator  Extended Emergency Contact Information Primary Emergency Contact: Dorann Lodge Address: [address]          [place], [place] [address] [name] of [place] Phone: [phone] Mobile Phone: [phone] Relation: Spouse  Code Status:  Full Code Goals of care: Advanced Directive information Advanced Directives 04/25/2021  Does Patient Have a Medical Advance Directive? Yes  Type of Paramedic of [place];Living will  Does patient want to make changes to medical advance directive? No - Patient declined  Copy of [place] in Chart? Yes - validated most recent copy scanned in chart (See row information)  Would patient like information on creating a medical advance directive? -     Chief Complaint  Patient presents with   Acute Visit    Follow up sob    HPI:  Pt is a 80 y.o. male seen today for an acute visit for sob.  Patient has a past medical history of CHB s/p PPM, atrial fibrillation on Tikosyn/Xarelto, essential hypertension, small cell lung cancer s/p wedge resection 2021 and chemotherapy/radiation to chest and brain (July and August).  He currently resides in rehab after hospitalization (04/09/21-04/16/21) due to progressive shortness of breath. He was treated for acute hypoxic respiratory failure with IV antibiotics vancomycin, cefepime, doxycycline and ceftriaxone, IV steroids Solu-Medrol and transitioned to 40 mg Prednisone.BNP elevated at 336.  CTA chest negative for PE but showed groundglass opacities and interlobular septal thickening on a background of fibrotic changes as well as increased bilateral  pleural effusions Also he was treated for acute on chronic diastolic CHF with IV diuresis and underwent right thoracentesis with 650cc of pleural fluid removed proved to be transudative. He was discharged on torsemide.   Update 04/24/21 : since arriving he has progressively declined with increased oxygen needs 4 liters with sats 88-90% and feeling more short of breath with exertion. He is even sob when eating. He does not have a fever. He has a cough with clear sputum. He is not able to work with therapy. He has a CXR showing possible pna on 9/19 and was placed on Doxycycline. Beta D glucagon was ordered but won't be back for 5 days. Torsemide was increased to 20 mg qd for fluid overload. He does not feel this has helped his symptoms. His BNP returned at 416. LFTS and Alk phos continue to rise. RUQ ultrasound showed no obstruction, normal liver, and normal size bile duct. It did not visualize the gallbladder  Update 04/25/21: Torsemide was increased to 40 mg bid per Dr Silas Flood. He has diuresed 3600cc over night. Oxygen has been increased to  6 liters. He goes between the mask and the nasal cannula. He does not feel he has improved in terms of shortness of breath. His weight is down 7 lbs. CXR after diuresis mild interstitial densities, shows signs of interstitial pneumonitis R>L. He is using xanax for anxiety and sob and it is helping.  Wt Readings from Last 3 Encounters:  04/28/21 139 lb (63 kg)  04/25/21 136 lb (61.7 kg)  04/24/21 143 lb 12.8 oz (65.2 kg)   Update 04/28/21: [name] is on 6 liters this morning [place]. He is not  short of breath at rest but continues to be sob with exertion. He feels he has met a plateau. He is taking clindamycin and prednisone for presumptive treatment of PCP pna but the primaquine has not come in from pharmacy and should be in tonight (due to lack of availability).  He is having a cough with clear sputum that is unchanged. He reports some RUQ low right chest pain after  meals. Not with exertion. He thinks its gas. He is also having some constipation and is taking prune juice. He is not having a fever.  His weight has remained at 139lbs over the weekend after a large diuresis. He saw his family and discussed his current situation. They are still considering hospice care but feel they are getting what they need at [facility].  Wt Readings from Last 3 Encounters:  04/28/21 139 lb (63 kg)  04/25/21 136 lb (61.7 kg)  04/24/21 143 lb 12.8 oz (65.2 kg)      Past Medical History:  Diagnosis Date   Allergy    Arthritis    "right knee and right thumb" (12/19/2014)   Asthma    pt denies   Atrial fibrillation ([place]) 2021   Bronchitis 07/2016   started in December and continued about 2 months   Central retinal artery occlusion 06/26/2013   CHF (congestive heart failure) ([place]) 08/24/2020   Esophageal stricture    GERD (gastroesophageal reflux disease)    Hemochromatosis    Hiatal hernia    Hypercalcemia    Hypercholesteremia    Hypertension    Internal hemorrhoids    Lung cancer ([name])    Osteoarthritis    Palpitations    Persistent atrial fibrillation ([name])    Presence of permanent cardiac pacemaker    hx bradycardia   Retinal artery occlusion, branch    "right eye"   Second degree AV block, Mobitz type II    Skin cancer    right shoulder   Small cell lung cancer, right lower lobe ([place]) 03/12/2020   Stroke ([place]) ~ 2011   "right eye"/ partial blindness   Tubular adenoma of colon    Past Surgical History:  Procedure Laterality Date   CARDIOVERSION N/A 07/29/2020   Procedure: CARDIOVERSION;  Surgeon: Jerline Pain, MD;  Location: [facility];  Service: Cardiovascular;  Laterality: N/A;  NEEDS RAPID COVID TEST MORNING OF   CARDIOVERSION N/A 08/19/2020   Procedure: CARDIOVERSION;  Surgeon: Thayer Headings, MD;  Location: [facility];  Service: Cardiovascular;  Laterality: N/A;   CATARACT EXTRACTION, BILATERAL  01/2017   COLONOSCOPY     EP  IMPLANTABLE DEVICE N/A 12/19/2014   Procedure: Pacemaker Implant;  Surgeon: Deboraha Sprang, MD;  Location: [facility];  Service: Cardiovascular;  Laterality: N/A;   ESOPHAGOGASTRODUODENOSCOPY (EGD) WITH ESOPHAGEAL DILATION  X 3   EYE SURGERY Bilateral 2017   INGUINAL HERNIA REPAIR Left 1980's   INSERT / REPLACE / REMOVE PACEMAKER  12/19/2014   INTERCOSTAL NERVE BLOCK Right 03/11/2020   Procedure: INTERCOSTAL NERVE BLOCK;  Surgeon: Grace Isaac, MD;  Location: [facility];  Service: Thoracic;  Laterality: Right;   IR THORACENTESIS ASP PLEURAL SPACE W/IMG GUIDE  04/10/2021   JOINT REPLACEMENT Right 2016   KNEE ARTHROSCOPY Right ~ 1982; ~ [place] Right ~ 2014   "pre-melanoma scapula"   POSTERIOR TIBIAL TENDON REPAIR Left 2012   TONSILLECTOMY  1946   TOTAL KNEE ARTHROPLASTY Right 05/20/2015   Procedure: RIGHT TOTAL KNEE ARTHROPLASTY;  Surgeon: Paralee Cancel, MD;  Location: [facility];  Service: Orthopedics;  Laterality: Right;   UPPER GASTROINTESTINAL ENDOSCOPY     VIDEO BRONCHOSCOPY N/A 03/11/2020   Procedure: VIDEO BRONCHOSCOPY;  Surgeon: Grace Isaac, MD;  Location: [facility];  Service: Thoracic;  Laterality: N/A;   VIDEO BRONCHOSCOPY WITH ENDOBRONCHIAL ULTRASOUND N/A 11/18/2020   Procedure: VIDEO BRONCHOSCOPY WITH ENDOBRONCHIAL ULTRASOUND;  Surgeon: Melrose Nakayama, MD;  Location: [facility];  Service: Thoracic;  Laterality: N/A;   XI ROBOTIC ASSISTED THORACOSCOPY- SEGMENTECTOMY Right 03/11/2020   Procedure: XI ROBOTIC ASSISTED THORACOSCOPY-RIGHT SUPERIOR SEGMENTECTOMY WITH NODE SAMPLES;  Surgeon: Grace Isaac, MD;  Location: [facility];  Service: Thoracic;  Laterality: Right;    Allergies  Allergen Reactions   Lisinopril Palpitations and Other (See Comments)    Dizziness, also    Allergies as of 04/28/2021       Reactions   Lisinopril Palpitations, Other (See Comments)   Dizziness, also        Medication List        Accurate as of April 28, 2021 10:55 AM. If  you have any questions, ask your nurse or doctor.          acetaminophen 500 MG tablet Commonly known as: TYLENOL Take 1,000 mg by mouth every 6 (six) hours as needed for moderate pain.   albuterol 108 (90 Base) MCG/ACT inhaler Commonly known as: VENTOLIN HFA Inhale 1 puff into the lungs every 6 (six) hours as needed for wheezing or shortness of breath.   ALPRAZolam 0.5 MG tablet Commonly known as: XANAX Take 1 tablet (0.5 mg total) by mouth every 6 (six) hours as needed for anxiety.   atorvastatin 20 MG tablet Commonly known as: LIPITOR TAKE 1 TABLET(20 MG) BY MOUTH DAILY   benzonatate 200 MG capsule Commonly known as: TESSALON Take 1 capsule (200 mg total) by mouth 3 (three) times daily as needed for cough.   clindamycin 300 MG capsule Commonly known as: CLEOCIN Take 600 mg by mouth 3 (three) times daily.   dofetilide 250 MCG capsule Commonly known as: TIKOSYN Take 1 capsule (250 mcg total) by mouth 2 (two) times daily.   doxazosin 4 MG tablet Commonly known as: CARDURA Take 4 mg by mouth daily.   esomeprazole 20 MG capsule Commonly known as: NEXIUM Take 20 mg by mouth daily before breakfast.   feeding supplement Liqd Take 1 Container by mouth 2 (two) times daily between meals. 07:00 AM - 03:00 PM, 03:00 PM - 11:00 PM   ipratropium-albuterol 0.5-2.5 (3) MG/3ML Soln Commonly known as: DUONEB Take 3 mLs by nebulization every 6 (six) hours. Scheduled for shortness of breath   memantine 10 MG tablet Commonly known as: Namenda Take 1 tablet (10 mg total) by mouth 2 (two) times daily.   montelukast 10 MG tablet Commonly known as: SINGULAIR Take 10 mg by mouth every morning.   OXYGEN Inhale 3-4 L into the lungs as directed. w/ exertion, every Shift; Shift 1, Shift 2, Shift 3   Pfizer-BioNT COVID-19 Vac-TriS Susp injection Generic drug: COVID-19 mRNA Vac-TriS (Pfizer) Inject into the muscle.   potassium chloride SA 20 MEQ tablet Commonly known as:  KLOR-CON Take 20 mEq by mouth daily. What changed: Another medication with the same name was removed. Continue taking this medication, and follow the directions you see here. Changed by: Royal Hawthorn, NP   predniSONE 10 MG tablet Commonly known as: DELTASONE Take 4 tablets (40 mg total) by mouth daily. What changed: when to  take this   PRESERVISION AREDS 2 PO Take 1 capsule by mouth in the morning and at bedtime.   primaquine 26.3 (15 Base) MG tablet Take 30 mg by mouth daily.   rivaroxaban 20 MG Tabs tablet Commonly known as: XARELTO Take 1 tablet (20 mg total) by mouth daily with supper.   senna-docusate 8.6-50 MG tablet Commonly known as: Senokot-S Take 1 tablet by mouth at bedtime as needed for mild constipation.   torsemide 20 MG tablet Commonly known as: DEMADEX Take 20 mg by mouth daily. What changed: Another medication with the same name was removed. Continue taking this medication, and follow the directions you see here. Changed by: Royal Hawthorn, NP   traZODone 50 MG tablet Commonly known as: DESYREL Take 1 tablet (50 mg total) by mouth at bedtime as needed for sleep.   vitamin C 500 MG tablet Commonly known as: ASCORBIC ACID Take 500 mg by mouth at bedtime.        Review of Systems  Constitutional:  Positive for appetite change and fatigue. Negative for activity change, chills, diaphoresis, fever and unexpected weight change.  Respiratory:  Positive for cough and shortness of breath. Negative for wheezing and stridor.   Cardiovascular:  Negative for chest pain, palpitations and leg swelling.  Gastrointestinal:  Positive for constipation. Negative for abdominal distention, abdominal pain (RUQ) and diarrhea.  Genitourinary:  Negative for difficulty urinating and dysuria.  Musculoskeletal:  Positive for gait problem. Negative for arthralgias, back pain, joint swelling and myalgias.  Neurological:  Positive for weakness. Negative for dizziness, seizures,  syncope, facial asymmetry, speech difficulty and headaches.  Hematological:  Negative for adenopathy. Does not bruise/bleed easily.  Psychiatric/Behavioral:  Negative for agitation, behavioral problems and confusion.    Immunization History  Administered Date(s) Administered   Influenza Split 06/16/2011, 05/17/2012, 05/12/2013, 05/03/2014   Influenza, High Dose Seasonal PF 05/01/2017   Influenza, Quadrivalent, Recombinant, Inj, Pf 05/07/2018, 03/30/2019   Influenza,inj,Quad PF,6+ Mos 05/15/2013, 03/30/2016   Influenza-Unspecified 05/03/2014, 05/15/2017   PFIZER Comirnaty(Gray Top)Covid-19 Tri-Sucrose Vaccine 11/19/2020   PFIZER(Purple Top)SARS-COV-2 Vaccination 07/29/2019, 08/09/2019, 11/19/2020   Pneumococcal Conjugate-13 12/01/2013, 04/03/2014   Pneumococcal Polysaccharide-23 06/04/2003, 06/13/2003, 07/23/2010   Td,absorbed, Preservative Free, Adult Use, Lf Unspecified 06/17/2005, 07/23/2010   Zoster Recombinat (Shingrix) 08/13/2017   Zoster, Live 10/02/2007   Pertinent  Health Maintenance Due  Topic Date Due   INFLUENZA VACCINE  05/10/2021 (Originally 03/03/2021)   COLONOSCOPY (Pts 45-5yr Insurance coverage will need to be confirmed)  01/05/2023   No flowsheet data found. Functional Status Survey:    Vitals:   04/28/21 1045  BP: 128/81  Pulse: 63  Resp: (!) 22  Temp: 97.9 F (36.6 C)  SpO2: 100%  Weight: 139 lb (63 kg)   Body mass index is 20.53 kg/m. Physical Exam Vitals and nursing note reviewed.  Constitutional:      General: He is not in acute distress.    Appearance: He is not diaphoretic.  HENT:     Head: Normocephalic and atraumatic.     Mouth/Throat:     Mouth: Mucous membranes are moist.     Pharynx: Oropharynx is clear. No oropharyngeal exudate.  Neck:     Thyroid: No thyromegaly.     Vascular: No JVD.     Trachea: No tracheal deviation.  Cardiovascular:     Rate and Rhythm: Normal rate and regular rhythm.     Heart sounds: No murmur  heard. Pulmonary:     Effort: Pulmonary effort is normal. No  respiratory distress.     Breath sounds: Rales present. No wheezing.     Comments: Slight increased wob Abdominal:     General: Bowel sounds are normal. There is no distension.     Palpations: Abdomen is soft.     Tenderness: There is no abdominal tenderness.  Musculoskeletal:     Right lower leg: No edema.     Left lower leg: No edema.  Lymphadenopathy:     Cervical: No cervical adenopathy.  Skin:    General: Skin is warm and dry.  Neurological:     Mental Status: He is alert and oriented to person, place, and time.     Cranial Nerves: No cranial nerve deficit.    Labs reviewed: Recent Labs    04/14/21 0152 04/15/21 0946 04/16/21 0141 04/21/21 0000 04/24/21 0000  NA 132* 133* 134* 138 138  K 3.9 4.5 4.1 4.5 4.3  CL 101 101 96* 102 102  CO2 _0 28* 28*  GLUCOSE 124* 167* 142*  --   --   BUN 18 21 31* 24* 22*  CREATININE 0.81 0.76 1.05 0.8 0.8  CALCIUM 9.5 10.2 10.1 10.2 10.0  MG 1.8 2.1 2.1  --   --   PHOS 2.4* 2.9 3.3  --   --     Recent Labs    04/14/21 0152 04/15/21 0946 04/16/21 0141 04/21/21 0000 04/24/21 0000  AST 21 29 41 49* 49*  ALT 25 35 47* 99* 124*  ALKPHOS 424* 465* 440* 503* 571*  BILITOT 1.7* 1.6* 1.6*  --   --   PROT 5.6* 5.9* 5.8*  --   --   ALBUMIN 2.5* 2.8* 2.7* 3.3*  --     Recent Labs    04/14/21 0152 04/15/21 0946 04/16/21 0141 04/21/21 0000  WBC 5.4 6.1 8.5 6.8  NEUTROABS 4.6 5.8 7.9*  --   HGB 10.0* 10.8* 10.4* 10.8*  HCT 31.1* 33.3* 32.2* 32*  MCV 85.2 85.6 85.4  --   PLT 120* 128* 157 147*    Lab Results  Component Value Date   TSH 5.630 (H) 08/23/2020   No results found for: HGBA1C No results found for: CHOL, HDL, LDLCALC, LDLDIRECT, TRIG, CHOLHDL  Significant Diagnostic Results in last 30 days:  DG Chest 1 View  Result Date: 04/10/2021 CLINICAL DATA:  Status right post thoracentesis EXAM: CHEST  1 VIEW COMPARISON:  Multiple priors FINDINGS: There  is a 2 lead implantable cardiac device with leads terminating in the right atrium and right ventricle. Again seen are pericardial calcifications. Improved effusion on the right side. No pneumothorax. There remains bilateral interstitial and pulmonary opacities as seen on recent cross-sectional CT of the chest. No acute osseous abnormality. IMPRESSION: Improved right-sided effusion after right-sided thoracentesis. No pneumothorax. Similar appearance of bilateral pulmonary opacities. Electronically Signed   By: Albin Felling M.D.   On: 04/10/2021 11:25   CT Angio Chest PE W/Cm &/Or Wo Cm  Result Date: 04/09/2021 CLINICAL DATA:  PE suspected, shortness of breath EXAM: CT ANGIOGRAPHY CHEST WITH CONTRAST TECHNIQUE: Multidetector CT imaging of the chest was performed using the standard protocol during bolus administration of intravenous contrast. Multiplanar CT image reconstructions and MIPs were obtained to evaluate the vascular anatomy. CONTRAST:  66m OMNIPAQUE IOHEXOL 350 MG/ML SOLN COMPARISON:  03/10/2021 CT chest FINDINGS: Cardiovascular: Satisfactory opacification of the pulmonary arteries to the segmental level. No evidence of pulmonary embolism. The heart is normal in size. Redemonstrated extensive pericardial calcifications. Aortic atherosclerosis, extending into  the aortic arch branch vessels. Three-vessel coronary artery calcifications. Redemonstrated aortic ectasia, measuring up to 4.2 cm. Left chest pacemaker with leads in the right atrium and right ventricle. Reflux of contrast into the IVC and hepatic veins. Mediastinum/Nodes: No enlarged mediastinal, hilar, or axillary lymph nodes. Thyroid gland, trachea, and esophagus demonstrate no significant findings. Lungs/Pleura: Postoperative changes of wedge resection of the superior segment of the right lower lobe. Increased areas of ground-glass attenuation and septal thickening superimposed on previously noted fibrotic changes. Increased right pleural  effusion, large, and left pleural effusion now moderate. No pneumothorax. Upper Abdomen: No acute abnormality. Musculoskeletal: No chest wall abnormality. No acute or significant osseous findings. Redemonstrated compression deformity of T11. Review of the MIP images confirms the above findings. IMPRESSION: 1. Increased ground-glass opacities and interlobular septal thickening, superimposed on background fibrotic changes, with increased bilateral pleural effusions, concerning for pulmonary edema, although superimposed infection can not be excluded. 2. Reflux of contrast into the hepatic veins, which can be seen in the setting of heart failure. Correlate with BNP. 3. Negative for pulmonary embolism to the level of the segmental arteries. 4. Ectasia of the ascending thoracic aorta, measuring to 4.2 cm in diameter. Recommend annual imaging followup by CTA or MRA. This recommendation follows 2010 ACCF/AHA/AATS/ACR/ASA/SCA/SCAI/SIR/STS/SVM Guidelines for the Diagnosis and Management of Patients with Thoracic Aortic Disease. Circulation. 2010; 121: Y301-S010. Aortic aneurysm NOS (ICD10-I71.9) 5.  Aortic Atherosclerosis (ICD10-I70.0). Electronically Signed   By: Merilyn Baba M.D.   On: 04/09/2021 13:37   DG CHEST PORT 1 VIEW  Result Date: 04/16/2021 CLINICAL DATA:  Dyspnea. EXAM: PORTABLE CHEST 1 VIEW COMPARISON:  April 15, 2021. FINDINGS: Stable cardiomegaly. Left-sided pacemaker is unchanged in position. No pneumothorax is noted. Stable right upper lobe airspace opacity is noted concerning for pneumonia. Minimal bibasilar subsegmental atelectasis or scarring is noted. Stable elevated right hemidiaphragm. Bony thorax unremarkable. IMPRESSION: Stable right upper lobe airspace opacity is noted concerning for pneumonia. Minimal bibasilar subsegmental atelectasis. Electronically Signed   By: Marijo Conception M.D.   On: 04/16/2021 08:28   DG CHEST PORT 1 VIEW  Result Date: 04/15/2021 CLINICAL DATA:  Shortness of  breath. EXAM: PORTABLE CHEST 1 VIEW COMPARISON:  04/13/2021 and 08/24/2020 FINDINGS: Stable appearance of dual-chamber cardiac pacemaker. Elevation of the right hemidiaphragm. Scattered parenchymal lung densities, right side greater than left, and minimally changed. Heart size is stable. Evidence of pericardial calcifications. Stable pleural-based densities in the lateral right chest. IMPRESSION: 1. Stable chest radiograph findings. 2. Minimal change in the bilateral parenchymal lung densities, right side greater than left. Findings likely represent acute or chronic disease. Suspect right pleural fluid based on previous CT findings. 3. Stable appearance of the heart with a dual-chamber cardiac pacemaker and pericardial calcifications. Electronically Signed   By: Markus Daft M.D.   On: 04/15/2021 10:55   DG CHEST PORT 1 VIEW  Result Date: 04/13/2021 CLINICAL DATA:  Shortness of breath EXAM: PORTABLE CHEST 1 VIEW COMPARISON:  Yesterday FINDINGS: Dual lead pacer. Midline trachea. Mild cardiomegaly. Chronic calcific pericarditis. Moderate right hemidiaphragm elevation. No pleural effusion or pneumothorax. Diffuse interstitial lung disease. Worsened right upper and right infrahilar airspace disease. Similar left lower lung airspace disease. IMPRESSION: Slightly worsened right-sided aeration, suspicious for pneumonia superimposed upon interstitial lung disease. No change in left lower lobe airspace disease. Aortic Atherosclerosis (ICD10-I70.0). Electronically Signed   By: Abigail Miyamoto M.D.   On: 04/13/2021 09:02   DG CHEST PORT 1 VIEW  Result Date: 04/12/2021 CLINICAL DATA:  Shortness  of breath. EXAM: PORTABLE CHEST 1 VIEW COMPARISON:  04/11/2021.  Chest CTA dated 04/09/2021. FINDINGS: Stable mildly enlarged cardiac silhouette and tortuous and calcified thoracic aorta. Stable left subclavian bipolar pacemaker leads. Stable dense pericardial calcifications. Stable diffuse prominence of the interstitial markings.  Interval mild patchy opacity in the left lower lung zone and right upper lobe with associated mild right upper lobe volume loss. Diffuse osteopenia. IMPRESSION: 1. Interval patchy atelectasis or pneumonia in the left lower lung zone. 2. Interval mild right upper lobe atelectasis. 3. Stable chronic interstitial lung disease. 4. Stable changes of calcific pericarditis. Electronically Signed   By: Claudie Revering M.D.   On: 04/12/2021 11:51   DG CHEST PORT 1 VIEW  Result Date: 04/11/2021 CLINICAL DATA:  Shortness of breath EXAM: PORTABLE CHEST 1 VIEW COMPARISON:  04/10/2021 FINDINGS: Left-sided implanted cardiac device remains in place. Stable cardiomediastinal contours. Aortic atherosclerosis. Diffuse interstitial opacities persist, not appreciably changed from prior. No pleural effusion. No pneumothorax. IMPRESSION: No significant interval change in diffuse interstitial opacities. Electronically Signed   By: Davina Poke D.O.   On: 04/11/2021 09:29   DG Chest Port 1 View  Result Date: 04/09/2021 CLINICAL DATA:  80 year old male with shortness of breath. Recently diagnosed with pneumonia. History of lung cancer and interstitial lung disease. EXAM: PORTABLE CHEST 1 VIEW COMPARISON:  Chest CT 03/10/2021 and earlier. FINDINGS: Portable AP upright view at 1106 hours. Lower lung volumes. Stable cardiac size and mediastinal contours. Chronic pericardial calcification. Stable left chest dual lead cardiac pacemaker. Increased vague right upper lobe and streaky left lung base opacity from earlier this year. Small pleural effusions seen by CT last month likely persist. No pneumothorax. Lung markings elsewhere appears stable. IMPRESSION: Chronic lung disease with lower lung volumes. Increased right upper lobe and left lung base opacity is nonspecific but could indicate acute infectious exacerbation. Query signs/symptoms of pneumonia. And small pleural effusion seen by CT last month likely persist. Electronically Signed    By: Genevie Ann M.D.   On: 04/09/2021 11:33   ECHOCARDIOGRAM COMPLETE  Result Date: 04/13/2021    ECHOCARDIOGRAM REPORT   Patient Name:   XAIDEN FLEIG Date of Exam: 04/13/2021 Medical Rec #:  [id number]      Height:       69.0 in Accession #:    [id number]     Weight:       154.8 lb Date of Birth:  10/07/1940       BSA:          1.853 m Patient Age:    38 years       BP:           106/69 mmHg Patient Gender: M              HR:           77 bpm. Exam Location:  Inpatient Procedure: 2D Echo, Cardiac Doppler and Color Doppler Indications:    dyspnea  History:        Patient has prior history of Echocardiogram examinations, most                 recent 08/14/2020. Pacemaker; Arrythmias:Atrial Fibrillation.  Sonographer:    MH Referring Phys: [id number] Georgina Quint LATIF [place]  1. Left ventricular ejection fraction, by estimation, is 60 to 65%. The left ventricle has normal function. The left ventricle has no regional wall motion abnormalities. Left ventricular diastolic parameters are consistent with Grade I diastolic dysfunction (impaired relaxation).  2. Right  ventricular systolic function is normal. The right ventricular size is normal. Tricuspid regurgitation signal is inadequate for assessing PA pressure.  3. Left atrial size was moderately dilated.  4. Right atrial size was moderately dilated.  5. The mitral valve is normal in structure. No evidence of mitral valve regurgitation. No evidence of mitral stenosis.  6. The aortic valve is tricuspid. Aortic valve regurgitation is not visualized. No aortic stenosis is present.  7. Aortic dilatation noted. There is mild dilatation of the aortic root, measuring 42 mm.  8. The inferior vena cava is dilated in size with >50% respiratory variability, suggesting right atrial pressure of 8 mmHg. FINDINGS  Left Ventricle: Left ventricular ejection fraction, by estimation, is 60 to 65%. The left ventricle has normal function. The left ventricle has no regional wall motion  abnormalities. The left ventricular internal cavity size was normal in size. There is  no left ventricular hypertrophy. Left ventricular diastolic parameters are consistent with Grade I diastolic dysfunction (impaired relaxation). Right Ventricle: The right ventricular size is normal. No increase in right ventricular wall thickness. Right ventricular systolic function is normal. Tricuspid regurgitation signal is inadequate for assessing PA pressure. The tricuspid regurgitant velocity is 2.52 m/s, and with an assumed right atrial pressure of 8 mmHg, the estimated right ventricular systolic pressure is 52.0 mmHg. Left Atrium: Left atrial size was moderately dilated. Right Atrium: Right atrial size was moderately dilated. Pericardium: There is no evidence of pericardial effusion. Mitral Valve: The mitral valve is normal in structure. Mild mitral annular calcification. No evidence of mitral valve regurgitation. No evidence of mitral valve stenosis. Tricuspid Valve: The tricuspid valve is normal in structure. Tricuspid valve regurgitation is mild . No evidence of tricuspid stenosis. Aortic Valve: The aortic valve is tricuspid. Aortic valve regurgitation is not visualized. No aortic stenosis is present. Aortic valve mean gradient measures 3.0 mmHg. Aortic valve peak gradient measures 5.0 mmHg. Aortic valve area, by VTI measures 1.52 cm. Pulmonic Valve: The pulmonic valve was normal in structure. Pulmonic valve regurgitation is not visualized. No evidence of pulmonic stenosis. Aorta: Aortic dilatation noted. There is mild dilatation of the aortic root, measuring 42 mm. Venous: The inferior vena cava is dilated in size with greater than 50% respiratory variability, suggesting right atrial pressure of 8 mmHg. IAS/Shunts: There is left bowing of the interatrial septum, suggestive of elevated right atrial pressure. The interatrial septum was not well visualized. Additional Comments: A device lead is visualized.  LEFT  VENTRICLE PLAX 2D LVIDd:         3.50 cm     Diastology LVIDs:         2.30 cm     LV e' medial:    6.20 cm/s LV PW:         1.00 cm     LV E/e' medial:  9.3 LV IVS:        1.60 cm     LV e' lateral:   8.05 cm/s LVOT diam:     1.90 cm     LV E/e' lateral: 7.2 LV SV:         39 LV SV Index:   21 LVOT Area:     2.84 cm  LV Volumes (MOD) LV vol d, MOD A4C: 40.5 ml LV vol s, MOD A4C: 13.4 ml LV SV MOD A4C:     40.5 ml RIGHT VENTRICLE            IVC RV S prime:  9.03 cm/s  IVC diam: 2.70 cm TAPSE (M-mode): 1.4 cm LEFT ATRIUM           Index       RIGHT ATRIUM           Index LA diam:      3.80 cm 2.05 cm/m  RA Area:     17.00 cm LA Vol (A2C): 50.1 ml 27.04 ml/m RA Volume:   40.20 ml  21.70 ml/m LA Vol (A4C): 50.7 ml 27.37 ml/m  AORTIC VALVE AV Area (Vmax):    1.70 cm AV Area (Vmean):   1.69 cm AV Area (VTI):     1.52 cm AV Vmax:           112.00 cm/s AV Vmean:          84.300 cm/s AV VTI:            0.255 m AV Peak Grad:      5.0 mmHg AV Mean Grad:      3.0 mmHg LVOT Vmax:         67.20 cm/s LVOT Vmean:        50.300 cm/s LVOT VTI:          0.137 m LVOT/AV VTI ratio: 0.54  AORTA Ao Root diam: 3.70 cm Ao Asc diam:  4.20 cm MITRAL VALVE               TRICUSPID VALVE MV Area (PHT): 3.44 cm    TR Peak grad:   25.4 mmHg MV E velocity: 57.80 cm/s  TR Vmax:        252.00 cm/s MV A velocity: 53.10 cm/s MV E/A ratio:  1.09        SHUNTS                            Systemic VTI:  0.14 m                            Systemic Diam: 1.90 cm Kirk Ruths MD Electronically signed by Kirk Ruths MD Signature Date/Time: 04/13/2021/1:50:53 PM    Final    IR THORACENTESIS ASP PLEURAL SPACE W/IMG GUIDE  Result Date: 04/10/2021 INDICATION: Patient with a history of acute on chronic heart failure and small cell lung cancer presents today with bilateral pleural effusion. Interventional Radiology asked to perform a therapeutic and diagnostic thoracentesis. EXAM: ULTRASOUND GUIDED THORACENTESIS MEDICATIONS: 1% lidocaine 10 mL  COMPLICATIONS: None immediate. PROCEDURE: An ultrasound guided thoracentesis was thoroughly discussed with the patient and questions answered. The benefits, risks, alternatives and complications were also discussed. The patient understands and wishes to proceed with the procedure. Written consent was obtained. Ultrasound was performed to localize and mark an adequate pocket of fluid in the right chest. The area was then prepped and draped in the normal sterile fashion. 1% Lidocaine was used for local anesthesia. Under ultrasound guidance a 6 Fr Safe-T-Centesis catheter was introduced. Thoracentesis was performed. The catheter was removed and a dressing applied. FINDINGS: A total of approximately 650 mL of amber-colored fluid was removed. Samples were sent to the laboratory as requested by the clinical team. IMPRESSION: Successful ultrasound guided right thoracentesis yielding 650 mL of pleural fluid. Read by: Soyla Dryer, NP Electronically Signed   By: Ruthann Cancer M.D.   On: 04/10/2021 11:32    Assessment/Plan  1. Shortness of breath Remained stable on 6 liters over the weekend.  Unclear if this is due to radiation and another possibility is PCP pneumonia.  He is on prednisone 40 mg bid, clindamycin and will start primaquine when it comes in this evening per Dr. Daryl Eastern' recommendation. He will see him in a virtual visit on 9/29.  Resident requests labs and another xray prior to this apt.   2. Acute respiratory failure with hypoxia ([place]) He seems to have met a plateau. Sats 100% on 6 liters. Ok to reduce to 5 liters at rest and increase with exertion  3. Acute on chronic diastolic CHF (congestive heart failure) ([name]) Weight holding below 140 after diuresis Continue torsemide 20 mg qd    4. Elevated alkaline phosphatase level With elevated LFTs Unclear etiology  with no symptoms Negative abd ultrasound ? If this is due to heart failure vs mets  5. Small cell lung cancer, right lower  lobe ([name]) S/p resection, chemo, radiation Followed by Dr Delene Ruffini  6. Constipation Miralax qod  7. RUQ pain  After meals No abnormality was found on ultrasound but the gallbladder was not well visualized. He is not a surgical candidate. Encouraged to eat slowly to avoid swallowing air and avoid straws.  Add simethicone 180 mg tid prn Recheck liver panel   Family/ staff Communication: discussed with Dr Wonda Olds. He is considering hospice care and his family is supportive. He would like to wait to make a decision until he talks with Dr Silas Flood and sees how treatment goes for PCP. He feels comfortable and pleased with his care at this time.   Labs/tests ordered:  BMP BNP liver function panel in am, CXR LDH and beta D lab pending at the time of this note.

## 2021-04-29 ENCOUNTER — Encounter: Payer: Medicare Other | Admitting: Internal Medicine

## 2021-04-29 DIAGNOSIS — I4821 Permanent atrial fibrillation: Secondary | ICD-10-CM | POA: Diagnosis not present

## 2021-04-29 DIAGNOSIS — J9601 Acute respiratory failure with hypoxia: Secondary | ICD-10-CM | POA: Diagnosis not present

## 2021-04-29 DIAGNOSIS — I501 Left ventricular failure: Secondary | ICD-10-CM | POA: Diagnosis not present

## 2021-04-29 DIAGNOSIS — I5033 Acute on chronic diastolic (congestive) heart failure: Secondary | ICD-10-CM | POA: Diagnosis not present

## 2021-04-29 DIAGNOSIS — I10 Essential (primary) hypertension: Secondary | ICD-10-CM | POA: Diagnosis not present

## 2021-04-29 DIAGNOSIS — R0602 Shortness of breath: Secondary | ICD-10-CM | POA: Diagnosis not present

## 2021-04-29 DIAGNOSIS — M6281 Muscle weakness (generalized): Secondary | ICD-10-CM | POA: Diagnosis not present

## 2021-04-29 DIAGNOSIS — I2510 Atherosclerotic heart disease of native coronary artery without angina pectoris: Secondary | ICD-10-CM | POA: Diagnosis not present

## 2021-04-29 LAB — BASIC METABOLIC PANEL
BUN: 38 — AB (ref 4–21)
CO2: 31 — AB (ref 13–22)
Chloride: 96 — AB (ref 99–108)
Creatinine: 1 (ref 0.6–1.3)
Glucose: 96
Potassium: 4.4 (ref 3.4–5.3)
Sodium: 139 (ref 137–147)

## 2021-04-29 LAB — HEPATIC FUNCTION PANEL
ALT: 80 — AB (ref 10–40)
AST: 33 (ref 14–40)
Alkaline Phosphatase: 567 — AB (ref 25–125)
Bilirubin, Direct: 0.87 — AB (ref 0.01–0.4)
Bilirubin, Total: 1.4

## 2021-04-29 LAB — COMPREHENSIVE METABOLIC PANEL
Albumin: 3.8 (ref 3.5–5.0)
Calcium: 10.6 (ref 8.7–10.7)

## 2021-04-30 DIAGNOSIS — R2689 Other abnormalities of gait and mobility: Secondary | ICD-10-CM | POA: Diagnosis not present

## 2021-04-30 DIAGNOSIS — M6281 Muscle weakness (generalized): Secondary | ICD-10-CM | POA: Diagnosis not present

## 2021-04-30 DIAGNOSIS — I4821 Permanent atrial fibrillation: Secondary | ICD-10-CM | POA: Diagnosis not present

## 2021-04-30 DIAGNOSIS — J9601 Acute respiratory failure with hypoxia: Secondary | ICD-10-CM | POA: Diagnosis not present

## 2021-04-30 DIAGNOSIS — R278 Other lack of coordination: Secondary | ICD-10-CM | POA: Diagnosis not present

## 2021-04-30 DIAGNOSIS — I5033 Acute on chronic diastolic (congestive) heart failure: Secondary | ICD-10-CM | POA: Diagnosis not present

## 2021-05-01 ENCOUNTER — Encounter: Payer: Self-pay | Admitting: Pulmonary Disease

## 2021-05-01 ENCOUNTER — Other Ambulatory Visit: Payer: Self-pay

## 2021-05-01 ENCOUNTER — Ambulatory Visit (INDEPENDENT_AMBULATORY_CARE_PROVIDER_SITE_OTHER): Payer: Medicare Other | Admitting: Pulmonary Disease

## 2021-05-01 DIAGNOSIS — J9611 Chronic respiratory failure with hypoxia: Secondary | ICD-10-CM

## 2021-05-01 DIAGNOSIS — I5033 Acute on chronic diastolic (congestive) heart failure: Secondary | ICD-10-CM | POA: Diagnosis not present

## 2021-05-01 DIAGNOSIS — R2689 Other abnormalities of gait and mobility: Secondary | ICD-10-CM | POA: Diagnosis not present

## 2021-05-01 DIAGNOSIS — M6281 Muscle weakness (generalized): Secondary | ICD-10-CM | POA: Diagnosis not present

## 2021-05-01 DIAGNOSIS — J9601 Acute respiratory failure with hypoxia: Secondary | ICD-10-CM | POA: Diagnosis not present

## 2021-05-01 DIAGNOSIS — R278 Other lack of coordination: Secondary | ICD-10-CM | POA: Diagnosis not present

## 2021-05-01 DIAGNOSIS — I4821 Permanent atrial fibrillation: Secondary | ICD-10-CM | POA: Diagnosis not present

## 2021-05-01 NOTE — Progress Notes (Signed)
Virtual Visit via Telephone Note  I connected with Alexander Murray, Dr.on 05/01/21 at  2:30 PM EDT by telephone and verified that I am speaking with the correct person using two identifiers.  Location: Patient: Home Provider: Office - [facility] - [address], [address], [place], [place] [address]   I discussed the limitations, risks, security and privacy concerns of performing an evaluation and management service by telephone and the availability of in person appointments. I also discussed with the patient that there may be a patient responsible charge related to this service. The patient expressed understanding and agreed to proceed.  Patient consented to consult via telephone: Yes People present and their role in pt care: Pt, nurse at [facility]    History of Present Illness:  80 y.o. man who presented to clinic several weeks ago with dyspnea on exertion.  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  CT images do not fit with this is bilateral nature.  He had persistent bilateral pleural effusions and worsening interlobular septal thickening.  Felt to be related to volume overload.  Resume steroids at 40 mg daily.  Started a taper.  He worsened during the taper.  Increase to 30 mg.  He developed oxygen requirement.  He went to the hospital.  He was diuresed.  Looks like his oxygen requirement improved with Lasix.  However, he has significant dyspnea but not improved.  He was placed on Solu-Medrol 125 mg IV daily.  No appreciable improvement.  He was sent home on prednisone 40 mg daily.  I was contacted in the interim on several different occasions.  He had progressive worsening, worsening hypoxemia and then up to using 3 L.  We discussed possible etiologies.  We increased torsemide and had good diuresis.  Did not improve unfortunately.  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  Unfortunately, this did not agree with his body very well.  He has been taking the  medications including clindamycin and primaquine.  Cannot use Bactrim due to concomitant decrease in use.  Increased his steroid to 40 mg twice daily in accordance with severe PJP treatment.  Subsequently, his Fungitell has returned negative.  Bite high-dose steroids, antibiotics, diuretics, he is worsened.  Now on 6 L.  Very dyspneic.  He is ready to stop suffering.  He would like to pursue comfort measures.  We discussed at length.  I support this decision.   Chief complaint:  Shortness of breath  Observations/Objective:  Social History   Tobacco Use  Smoking Status Former   Years: 3.00   Types: Cigarettes   Quit date: 05/16/1962   Years since quitting: 59.0  Smokeless Tobacco Never  Tobacco Comments   "quit smoking in the 1960's"; was an intermittent smoker    Immunization History  Administered Date(s) Administered   Influenza Split 06/16/2011, 05/17/2012, 05/12/2013, 05/03/2014   Influenza, High Dose Seasonal PF 05/01/2017   Influenza, Quadrivalent, Recombinant, Inj, Pf 05/07/2018, 03/30/2019   Influenza,inj,Quad PF,6+ Mos 05/15/2013, 03/30/2016   Influenza-Unspecified 05/03/2014, 05/15/2017   PFIZER Comirnaty(Gray Top)Covid-19 Tri-Sucrose Vaccine 11/19/2020   PFIZER(Purple Top)SARS-COV-2 Vaccination 07/29/2019, 08/09/2019, 11/19/2020   Pneumococcal Conjugate-13 12/01/2013, 04/03/2014   Pneumococcal Polysaccharide-23 06/04/2003, 06/13/2003, 07/23/2010   Td,absorbed, Preservative Free, Adult Use, Lf Unspecified 06/17/2005, 07/23/2010   Zoster Recombinat (Shingrix) 08/13/2017   Zoster, Live 10/02/2007      Assessment and Plan:  Acute on Chronic Hypoxemic Respiratory Failure: Progressive over the last several weeks.  Initially felt to be related to volume overload and pulmonary   edema.  Improved a bit in the hospital with diuresis.  However, he continued to have progressive respiratory failure.  Now on 6 L.  Initially on room air when first met.  Have increased diuresis in  the outpatient setting with driving down of chloride and bump in creatinine without improvement.  Fortunately, throughout this be been because of the steroids in case there was an inflammatory component.  Despite aggressive steroid therapy with outpatient and inpatient, he has had worsening.  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  Not much more to help her.  Fungitell negative, unlikely to be PJP.  Discussed with patient and nurse via phone.  Patient desires to focus on his comfort above all else.  I support this.  We will stop all medications that do not provide comfort for him.  Recommended a steroid taper over the next.  Discussed the use of anxiolytics and morphine for air hunger.  He understands and is amenable to using these for treatment.  Titration of such medication for comfort in the capable hands of the [facility] staff.  Follow Up Instructions:  Use medicines to focus on her comfort.  We will stop medicines that are not necessary for this.   I discussed the assessment and treatment plan with the patient. The patient was provided an opportunity to ask questions and all were answered. The patient agreed with the plan and demonstrated an understanding of the instructions.   The patient was advised to call back or seek an in-person evaluation if the symptoms worsen or if the condition fails to improve as anticipated.  I provided 13 minutes of non-face-to-face time during this encounter.   Lanier Clam, MD

## 2021-05-02 ENCOUNTER — Encounter: Payer: Self-pay | Admitting: Adult Health

## 2021-05-02 ENCOUNTER — Non-Acute Institutional Stay (SKILLED_NURSING_FACILITY): Payer: Medicare Other | Admitting: Adult Health

## 2021-05-02 DIAGNOSIS — J9601 Acute respiratory failure with hypoxia: Secondary | ICD-10-CM

## 2021-05-02 DIAGNOSIS — K5901 Slow transit constipation: Secondary | ICD-10-CM | POA: Diagnosis not present

## 2021-05-02 DIAGNOSIS — C3431 Malignant neoplasm of lower lobe, right bronchus or lung: Secondary | ICD-10-CM

## 2021-05-02 DIAGNOSIS — I5033 Acute on chronic diastolic (congestive) heart failure: Secondary | ICD-10-CM

## 2021-05-02 DIAGNOSIS — J3489 Other specified disorders of nose and nasal sinuses: Secondary | ICD-10-CM

## 2021-05-02 MED ORDER — MORPHINE SULFATE (CONCENTRATE) 20 MG/ML PO SOLN: 5.0000 mg | ORAL | 0 refills | Status: AC | PRN

## 2021-05-02 NOTE — Progress Notes (Signed)
Location:  Occupational psychologist of Service:  SNF (31) Provider:   Cindi Carbon, [place] [id number]   [name], MD  Patient Care Team: [name], MD as PCP - General (Internal Medicine) Deboraha Sprang, MD as PCP - Electrophysiology (Cardiology) Valrie Hart, RN as Oncology Nurse Navigator  Extended Emergency Contact Information Primary Emergency Contact: Dorann Lodge Address: [address]          [place], [place] [address] [name] of [place] Phone: [phone] Mobile Phone: [phone] Relation: Spouse  Code Status:  DNR Goals of care: Advanced Directive information Advanced Directives 04/25/2021  Does Patient Have a Medical Advance Directive? Yes  Type of Paramedic of [place];Living will  Does patient want to make changes to medical advance directive? No - Patient declined  Copy of [place] in Chart? Yes - validated most recent copy scanned in chart (See row information)  Would patient like information on creating a medical advance directive? -     Chief Complaint  Patient presents with   Acute Visit    sob    HPI:  Pt is a 80 y.o. male seen today for an acute visit for sob. Patient has a past medical history of CHB s/p PPM, atrial fibrillation on Tikosyn/Xarelto, essential hypertension, small cell lung cancer s/p wedge resection 2021 and chemotherapy/radiation to chest and brain (July and August).  He resides in skilled care after a hospitalization for sob. He was diuresed and treated with antibiotics. CTA chest negative for PE but showed groundglass opacities and interlobular septal thickening on a background of fibrotic changes as well as increased bilateral pleural effusions.  He has been further treated with diuresis here at [facility] and with antibiotics for fungal and PCP pna.  He has continued to decline with sob and hypoxia. He was seen by  pulmonary on 9/29 and diuretics and antibiotics were discontinued. Hospice was ordered. He is not eating well and not able to get out of bed due to sob. On 7-8 liters of oxygen. Using xanax for anxiety and sob with relief. Has a cough with thin mucus. No fever. Bowels are moving. Nares are dry.   Past Medical History:  Diagnosis Date   Allergy    Arthritis    "right knee and right thumb" (12/19/2014)   Asthma    pt denies   Atrial fibrillation ([facility]) 2021   Bronchitis 07/2016   started in December and continued about 2 months   Central retinal artery occlusion 06/26/2013   CHF (congestive heart failure) ([facility]) 08/24/2020   Esophageal stricture    GERD (gastroesophageal reflux disease)    Hemochromatosis    Hiatal hernia    Hypercalcemia    Hypercholesteremia    Hypertension    Internal hemorrhoids    Lung cancer ([name])    Osteoarthritis    Palpitations    Persistent atrial fibrillation ([name])    Presence of permanent cardiac pacemaker    hx bradycardia   Retinal artery occlusion, branch    "right eye"   Second degree AV block, Mobitz type II    Skin cancer    right shoulder   Small cell lung cancer, right lower lobe ([facility]) 03/12/2020   Stroke ([facility]) ~ 2011   "right eye"/ partial blindness   Tubular adenoma of colon    Past Surgical History:  Procedure Laterality Date   CARDIOVERSION N/A 07/29/2020   Procedure: CARDIOVERSION;  Surgeon: [name]  C, MD;  Location: [facility];  Service: Cardiovascular;  Laterality: N/A;  NEEDS RAPID COVID TEST MORNING OF   CARDIOVERSION N/A 08/19/2020   Procedure: CARDIOVERSION;  Surgeon: Acie Fredrickson Wonda Cheng, MD;  Location: [facility];  Service: Cardiovascular;  Laterality: N/A;   CATARACT EXTRACTION, BILATERAL  01/2017   COLONOSCOPY     EP IMPLANTABLE DEVICE N/A 12/19/2014   Procedure: Pacemaker Implant;  Surgeon: Deboraha Sprang, MD;  Location: [facility];  Service: Cardiovascular;  Laterality: N/A;   ESOPHAGOGASTRODUODENOSCOPY (EGD)  WITH ESOPHAGEAL DILATION  X 3   EYE SURGERY Bilateral 2017   INGUINAL HERNIA REPAIR Left 1980's   INSERT / REPLACE / REMOVE PACEMAKER  12/19/2014   INTERCOSTAL NERVE BLOCK Right 03/11/2020   Procedure: INTERCOSTAL NERVE BLOCK;  Surgeon: Grace Isaac, MD;  Location: [facility];  Service: Thoracic;  Laterality: Right;   IR THORACENTESIS ASP PLEURAL SPACE W/IMG GUIDE  04/10/2021   JOINT REPLACEMENT Right 2016   KNEE ARTHROSCOPY Right ~ 1982; ~ [facility] Right ~ 2014   "pre-melanoma scapula"   POSTERIOR TIBIAL TENDON REPAIR Left 2012   TONSILLECTOMY  1946   TOTAL KNEE ARTHROPLASTY Right 05/20/2015   Procedure: RIGHT TOTAL KNEE ARTHROPLASTY;  Surgeon: Paralee Cancel, MD;  Location: [facility];  Service: Orthopedics;  Laterality: Right;   UPPER GASTROINTESTINAL ENDOSCOPY     VIDEO BRONCHOSCOPY N/A 03/11/2020   Procedure: VIDEO BRONCHOSCOPY;  Surgeon: Grace Isaac, MD;  Location: [facility];  Service: Thoracic;  Laterality: N/A;   VIDEO BRONCHOSCOPY WITH ENDOBRONCHIAL ULTRASOUND N/A 11/18/2020   Procedure: VIDEO BRONCHOSCOPY WITH ENDOBRONCHIAL ULTRASOUND;  Surgeon: Melrose Nakayama, MD;  Location: [facility];  Service: Thoracic;  Laterality: N/A;   XI ROBOTIC ASSISTED THORACOSCOPY- SEGMENTECTOMY Right 03/11/2020   Procedure: XI ROBOTIC ASSISTED THORACOSCOPY-RIGHT SUPERIOR SEGMENTECTOMY WITH NODE SAMPLES;  Surgeon: Grace Isaac, MD;  Location: [facility];  Service: Thoracic;  Laterality: Right;    Allergies  Allergen Reactions   Lisinopril Palpitations and Other (See Comments)    Dizziness, also    Outpatient Encounter Medications as of 05/02/2021  Medication Sig   morphine (ROXANOL) 20 MG/ML concentrated solution Take 0.25 mLs (5 mg total) by mouth every 4 (four) hours as needed for severe pain or shortness of breath.   acetaminophen (TYLENOL) 500 MG tablet Take 1,000 mg by mouth every 6 (six) hours as needed for moderate pain.   albuterol (VENTOLIN HFA) 108 (90 Base) MCG/ACT inhaler Inhale 1  puff into the lungs every 6 (six) hours as needed for wheezing or shortness of breath.   ALPRAZolam (XANAX) 0.5 MG tablet Take 1 tablet (0.5 mg total) by mouth every 6 (six) hours as needed for anxiety.   benzonatate (TESSALON) 200 MG capsule Take 1 capsule (200 mg total) by mouth 3 (three) times daily as needed for cough.   clindamycin (CLEOCIN) 300 MG capsule Take 600 mg by mouth 3 (three) times daily.   COVID-19 mRNA Vac-TriS, Pfizer, (PFIZER-BIONT COVID-19 VAC-TRIS) SUSP injection Inject into the muscle.   dofetilide (TIKOSYN) 250 MCG capsule Take 1 capsule (250 mcg total) by mouth 2 (two) times daily.   doxazosin (CARDURA) 4 MG tablet Take 4 mg by mouth daily.   esomeprazole (NEXIUM) 20 MG capsule Take 20 mg by mouth daily before breakfast.   feeding supplement (BOOST HIGH PROTEIN) LIQD Take 1 Container by mouth 2 (two) times daily between meals. 07:00 AM - 03:00 PM, 03:00 PM - 11:00 PM   ipratropium-albuterol (DUONEB)  0.5-2.5 (3) MG/3ML SOLN Take 3 mLs by nebulization every 6 (six) hours. Scheduled for shortness of breath   memantine (NAMENDA) 10 MG tablet Take 1 tablet (10 mg total) by mouth 2 (two) times daily.   montelukast (SINGULAIR) 10 MG tablet Take 10 mg by mouth every morning.   OXYGEN Inhale 3-4 L into the lungs as directed. w/ exertion, every Shift; Shift 1, Shift 2, Shift 3   polyethylene glycol (MIRALAX / GLYCOLAX) 17 g packet Take 17 g by mouth every other day.   potassium chloride SA (KLOR-CON) 20 MEQ tablet Take 20 mEq by mouth daily.   predniSONE (DELTASONE) 10 MG tablet Take 4 tablets (40 mg total) by mouth daily. (Patient taking differently: Take 20 mg by mouth daily. 20 mg for 5 days then 10mg  for 5 days then 5 mg for 5 days then d/c)   rivaroxaban (XARELTO) 20 MG TABS tablet Take 1 tablet (20 mg total) by mouth daily with supper.   senna-docusate (SENOKOT-S) 8.6-50 MG tablet Take 1 tablet by mouth at bedtime as needed for mild constipation.   Simethicone 180 MG CAPS Take  180 mg by mouth 3 (three) times daily as needed.   traZODone (DESYREL) 50 MG tablet Take 1 tablet (50 mg total) by mouth at bedtime as needed for sleep.   [DISCONTINUED] atorvastatin (LIPITOR) 20 MG tablet TAKE 1 TABLET(20 MG) BY MOUTH DAILY   [DISCONTINUED] Multiple Vitamins-Minerals (PRESERVISION AREDS 2 PO) Take 1 capsule by mouth in the morning and at bedtime.   [DISCONTINUED] primaquine 26.3 (15 Base) MG tablet Take 30 mg by mouth daily.   [DISCONTINUED] torsemide (DEMADEX) 20 MG tablet Take 20 mg by mouth daily.   [DISCONTINUED] vitamin C (ASCORBIC ACID) 500 MG tablet Take 500 mg by mouth at bedtime.   No facility-administered encounter medications on file as of 05/02/2021.    Review of Systems  Constitutional:  Positive for activity change, appetite change and fatigue. Negative for chills, diaphoresis, fever and unexpected weight change.  Respiratory:  Positive for cough and shortness of breath. Negative for wheezing and stridor.   Cardiovascular:  Negative for chest pain, palpitations and leg swelling.  Gastrointestinal:  Negative for abdominal distention, abdominal pain, constipation and diarrhea.  Genitourinary:  Negative for difficulty urinating and dysuria.  Musculoskeletal:  Positive for gait problem. Negative for arthralgias, back pain, joint swelling and myalgias.  Neurological:  Negative for dizziness, seizures, syncope, facial asymmetry, speech difficulty, weakness and headaches.  Hematological:  Negative for adenopathy. Does not bruise/bleed easily.  Psychiatric/Behavioral:  Negative for agitation, behavioral problems and confusion.    Immunization History  Administered Date(s) Administered   Influenza Split 06/16/2011, 05/17/2012, 05/12/2013, 05/03/2014   Influenza, High Dose Seasonal PF 05/01/2017   Influenza, Quadrivalent, Recombinant, Inj, Pf 05/07/2018, 03/30/2019   Influenza,inj,Quad PF,6+ Mos 05/15/2013, 03/30/2016   Influenza-Unspecified 05/03/2014, 05/15/2017    PFIZER Comirnaty(Gray Top)Covid-19 Tri-Sucrose Vaccine 11/19/2020   PFIZER(Purple Top)SARS-COV-2 Vaccination 07/29/2019, 08/09/2019, 11/19/2020   Pneumococcal Conjugate-13 12/01/2013, 04/03/2014   Pneumococcal Polysaccharide-23 06/04/2003, 06/13/2003, 07/23/2010   Td,absorbed, Preservative Free, Adult Use, Lf Unspecified 06/17/2005, 07/23/2010   Zoster Recombinat (Shingrix) 08/13/2017   Zoster, Live 10/02/2007   Pertinent  Health Maintenance Due  Topic Date Due   INFLUENZA VACCINE  05/10/2021 (Originally 03/03/2021)   COLONOSCOPY (Pts 45-63yrs Insurance coverage will need to be confirmed)  01/05/2023   No flowsheet data found. Functional Status Survey:    Vitals:   05/02/21 0953  BP: 111/70  Pulse: 71  Temp: 97.7 F (  36.5 C)  SpO2: 95%   There is no height or weight on file to calculate BMI. Physical Exam Vitals reviewed.  Constitutional:      General: He is not in acute distress.    Appearance: He is not diaphoretic.  HENT:     Head: Normocephalic and atraumatic.  Neck:     Thyroid: No thyromegaly.     Vascular: No JVD.     Trachea: No tracheal deviation.  Cardiovascular:     Rate and Rhythm: Normal rate. Rhythm irregular.     Heart sounds: Murmur heard.  Pulmonary:     Effort: Pulmonary effort is normal. No respiratory distress.     Breath sounds: Wheezing and rales present.     Comments: Increased wob Abdominal:     General: Bowel sounds are normal. There is no distension.     Palpations: Abdomen is soft.     Tenderness: There is no abdominal tenderness.  Musculoskeletal:     Right lower leg: No edema.     Left lower leg: No edema.  Lymphadenopathy:     Cervical: No cervical adenopathy.  Skin:    General: Skin is warm and dry.  Neurological:     Mental Status: He is alert and oriented to person, place, and time.     Cranial Nerves: No cranial nerve deficit.  Psychiatric:        Mood and Affect: Mood normal.    Labs reviewed: Recent Labs     04/14/21 0152 04/15/21 0946 04/16/21 0141 04/21/21 0000 04/24/21 0000 04/26/21 0000  NA 132* 133* 134* 138 138 139  K 3.9 4.5 4.1 4.5 4.3 4.3  CL 101 101 96* 102 102 97*  CO2 25 24 28  28* 28* 29*  GLUCOSE 124* 167* 142*  --   --   --   BUN 18 21 31* 24* 22* 31*  CREATININE 0.81 0.76 1.05 0.8 0.8 1.1  CALCIUM 9.5 10.2 10.1 10.2 10.0 10.3  MG 1.8 2.1 2.1  --   --   --   PHOS 2.4* 2.9 3.3  --   --   --    Recent Labs    04/14/21 0152 04/15/21 0946 04/16/21 0141 04/21/21 0000 04/24/21 0000  AST 21 29 41 49* 49*  ALT 25 35 47* 99* 124*  ALKPHOS 424* 465* 440* 503* 571*  BILITOT 1.7* 1.6* 1.6*  --   --   PROT 5.6* 5.9* 5.8*  --   --   ALBUMIN 2.5* 2.8* 2.7* 3.3*  --    Recent Labs    04/14/21 0152 04/15/21 0946 04/16/21 0141 04/21/21 0000  WBC 5.4 6.1 8.5 6.8  NEUTROABS 4.6 5.8 7.9*  --   HGB 10.0* 10.8* 10.4* 10.8*  HCT 31.1* 33.3* 32.2* 32*  MCV 85.2 85.6 85.4  --   PLT 120* 128* 157 147*   Lab Results  Component Value Date   TSH 5.630 (H) 08/23/2020   No results found for: HGBA1C No results found for: CHOL, HDL, LDLCALC, LDLDIRECT, TRIG, CHOLHDL  Significant Diagnostic Results in last 30 days:  DG Chest 1 View  Result Date: 04/10/2021 CLINICAL DATA:  Status right post thoracentesis EXAM: CHEST  1 VIEW COMPARISON:  Multiple priors FINDINGS: There is a 2 lead implantable cardiac device with leads terminating in the right atrium and right ventricle. Again seen are pericardial calcifications. Improved effusion on the right side. No pneumothorax. There remains bilateral interstitial and pulmonary opacities as seen on recent cross-sectional CT of  the chest. No acute osseous abnormality. IMPRESSION: Improved right-sided effusion after right-sided thoracentesis. No pneumothorax. Similar appearance of bilateral pulmonary opacities. Electronically Signed   By: Albin Felling M.D.   On: 04/10/2021 11:25   CT Angio Chest PE W/Cm &/Or Wo Cm  Result Date: 04/09/2021 CLINICAL  DATA:  PE suspected, shortness of breath EXAM: CT ANGIOGRAPHY CHEST WITH CONTRAST TECHNIQUE: Multidetector CT imaging of the chest was performed using the standard protocol during bolus administration of intravenous contrast. Multiplanar CT image reconstructions and MIPs were obtained to evaluate the vascular anatomy. CONTRAST:  16mL OMNIPAQUE IOHEXOL 350 MG/ML SOLN COMPARISON:  03/10/2021 CT chest FINDINGS: Cardiovascular: Satisfactory opacification of the pulmonary arteries to the segmental level. No evidence of pulmonary embolism. The heart is normal in size. Redemonstrated extensive pericardial calcifications. Aortic atherosclerosis, extending into the aortic arch branch vessels. Three-vessel coronary artery calcifications. Redemonstrated aortic ectasia, measuring up to 4.2 cm. Left chest pacemaker with leads in the right atrium and right ventricle. Reflux of contrast into the IVC and hepatic veins. Mediastinum/Nodes: No enlarged mediastinal, hilar, or axillary lymph nodes. Thyroid gland, trachea, and esophagus demonstrate no significant findings. Lungs/Pleura: Postoperative changes of wedge resection of the superior segment of the right lower lobe. Increased areas of ground-glass attenuation and septal thickening superimposed on previously noted fibrotic changes. Increased right pleural effusion, large, and left pleural effusion now moderate. No pneumothorax. Upper Abdomen: No acute abnormality. Musculoskeletal: No chest wall abnormality. No acute or significant osseous findings. Redemonstrated compression deformity of T11. Review of the MIP images confirms the above findings. IMPRESSION: 1. Increased ground-glass opacities and interlobular septal thickening, superimposed on background fibrotic changes, with increased bilateral pleural effusions, concerning for pulmonary edema, although superimposed infection can not be excluded. 2. Reflux of contrast into the hepatic veins, which can be seen in the setting of  heart failure. Correlate with BNP. 3. Negative for pulmonary embolism to the level of the segmental arteries. 4. Ectasia of the ascending thoracic aorta, measuring to 4.2 cm in diameter. Recommend annual imaging followup by CTA or MRA. This recommendation follows 2010 ACCF/AHA/AATS/ACR/ASA/SCA/SCAI/SIR/STS/SVM Guidelines for the Diagnosis and Management of Patients with Thoracic Aortic Disease. Circulation. 2010; 121: C376-E831. Aortic aneurysm NOS (ICD10-I71.9) 5.  Aortic Atherosclerosis (ICD10-I70.0). Electronically Signed   By: Merilyn Baba M.D.   On: 04/09/2021 13:37   DG CHEST PORT 1 VIEW  Result Date: 04/16/2021 CLINICAL DATA:  Dyspnea. EXAM: PORTABLE CHEST 1 VIEW COMPARISON:  April 15, 2021. FINDINGS: Stable cardiomegaly. Left-sided pacemaker is unchanged in position. No pneumothorax is noted. Stable right upper lobe airspace opacity is noted concerning for pneumonia. Minimal bibasilar subsegmental atelectasis or scarring is noted. Stable elevated right hemidiaphragm. Bony thorax unremarkable. IMPRESSION: Stable right upper lobe airspace opacity is noted concerning for pneumonia. Minimal bibasilar subsegmental atelectasis. Electronically Signed   By: Marijo Conception M.D.   On: 04/16/2021 08:28   DG CHEST PORT 1 VIEW  Result Date: 04/15/2021 CLINICAL DATA:  Shortness of breath. EXAM: PORTABLE CHEST 1 VIEW COMPARISON:  04/13/2021 and 08/24/2020 FINDINGS: Stable appearance of dual-chamber cardiac pacemaker. Elevation of the right hemidiaphragm. Scattered parenchymal lung densities, right side greater than left, and minimally changed. Heart size is stable. Evidence of pericardial calcifications. Stable pleural-based densities in the lateral right chest. IMPRESSION: 1. Stable chest radiograph findings. 2. Minimal change in the bilateral parenchymal lung densities, right side greater than left. Findings likely represent acute or chronic disease. Suspect right pleural fluid based on previous CT  findings. 3. Stable appearance of  the heart with a dual-chamber cardiac pacemaker and pericardial calcifications. Electronically Signed   By: Markus Daft M.D.   On: 04/15/2021 10:55   DG CHEST PORT 1 VIEW  Result Date: 04/13/2021 CLINICAL DATA:  Shortness of breath EXAM: PORTABLE CHEST 1 VIEW COMPARISON:  Yesterday FINDINGS: Dual lead pacer. Midline trachea. Mild cardiomegaly. Chronic calcific pericarditis. Moderate right hemidiaphragm elevation. No pleural effusion or pneumothorax. Diffuse interstitial lung disease. Worsened right upper and right infrahilar airspace disease. Similar left lower lung airspace disease. IMPRESSION: Slightly worsened right-sided aeration, suspicious for pneumonia superimposed upon interstitial lung disease. No change in left lower lobe airspace disease. Aortic Atherosclerosis (ICD10-I70.0). Electronically Signed   By: Abigail Miyamoto M.D.   On: 04/13/2021 09:02   DG CHEST PORT 1 VIEW  Result Date: 04/12/2021 CLINICAL DATA:  Shortness of breath. EXAM: PORTABLE CHEST 1 VIEW COMPARISON:  04/11/2021.  Chest CTA dated 04/09/2021. FINDINGS: Stable mildly enlarged cardiac silhouette and tortuous and calcified thoracic aorta. Stable left subclavian bipolar pacemaker leads. Stable dense pericardial calcifications. Stable diffuse prominence of the interstitial markings. Interval mild patchy opacity in the left lower lung zone and right upper lobe with associated mild right upper lobe volume loss. Diffuse osteopenia. IMPRESSION: 1. Interval patchy atelectasis or pneumonia in the left lower lung zone. 2. Interval mild right upper lobe atelectasis. 3. Stable chronic interstitial lung disease. 4. Stable changes of calcific pericarditis. Electronically Signed   By: Claudie Revering M.D.   On: 04/12/2021 11:51   DG CHEST PORT 1 VIEW  Result Date: 04/11/2021 CLINICAL DATA:  Shortness of breath EXAM: PORTABLE CHEST 1 VIEW COMPARISON:  04/10/2021 FINDINGS: Left-sided implanted cardiac device remains in  place. Stable cardiomediastinal contours. Aortic atherosclerosis. Diffuse interstitial opacities persist, not appreciably changed from prior. No pleural effusion. No pneumothorax. IMPRESSION: No significant interval change in diffuse interstitial opacities. Electronically Signed   By: Davina Poke D.O.   On: 04/11/2021 09:29   DG Chest Port 1 View  Result Date: 04/09/2021 CLINICAL DATA:  80 year old male with shortness of breath. Recently diagnosed with pneumonia. History of lung cancer and interstitial lung disease. EXAM: PORTABLE CHEST 1 VIEW COMPARISON:  Chest CT 03/10/2021 and earlier. FINDINGS: Portable AP upright view at 1106 hours. Lower lung volumes. Stable cardiac size and mediastinal contours. Chronic pericardial calcification. Stable left chest dual lead cardiac pacemaker. Increased vague right upper lobe and streaky left lung base opacity from earlier this year. Small pleural effusions seen by CT last month likely persist. No pneumothorax. Lung markings elsewhere appears stable. IMPRESSION: Chronic lung disease with lower lung volumes. Increased right upper lobe and left lung base opacity is nonspecific but could indicate acute infectious exacerbation. Query signs/symptoms of pneumonia. And small pleural effusion seen by CT last month likely persist. Electronically Signed   By: Genevie Ann M.D.   On: 04/09/2021 11:33   ECHOCARDIOGRAM COMPLETE  Result Date: 04/13/2021    ECHOCARDIOGRAM REPORT   Patient Name:   JERREMY MAIONE Date of Exam: 04/13/2021 Medical Rec #:  [id number]      Height:       69.0 in Accession #:    [id number]     Weight:       154.8 lb Date of Birth:  06/24/1941       BSA:          1.853 m Patient Age:    80 years       BP:           106/69 mmHg Patient  Gender: M              HR:           77 bpm. Exam Location:  Inpatient Procedure: 2D Echo, Cardiac Doppler and Color Doppler Indications:    dyspnea  History:        Patient has prior history of Echocardiogram examinations, most                  recent 08/14/2020. Pacemaker; Arrythmias:Atrial Fibrillation.  Sonographer:    MH Referring Phys: [id number] Georgina Quint LATIF [place]  1. Left ventricular ejection fraction, by estimation, is 60 to 65%. The left ventricle has normal function. The left ventricle has no regional wall motion abnormalities. Left ventricular diastolic parameters are consistent with Grade I diastolic dysfunction (impaired relaxation).  2. Right ventricular systolic function is normal. The right ventricular size is normal. Tricuspid regurgitation signal is inadequate for assessing PA pressure.  3. Left atrial size was moderately dilated.  4. Right atrial size was moderately dilated.  5. The mitral valve is normal in structure. No evidence of mitral valve regurgitation. No evidence of mitral stenosis.  6. The aortic valve is tricuspid. Aortic valve regurgitation is not visualized. No aortic stenosis is present.  7. Aortic dilatation noted. There is mild dilatation of the aortic root, measuring 42 mm.  8. The inferior vena cava is dilated in size with >50% respiratory variability, suggesting right atrial pressure of 8 mmHg. FINDINGS  Left Ventricle: Left ventricular ejection fraction, by estimation, is 60 to 65%. The left ventricle has normal function. The left ventricle has no regional wall motion abnormalities. The left ventricular internal cavity size was normal in size. There is  no left ventricular hypertrophy. Left ventricular diastolic parameters are consistent with Grade I diastolic dysfunction (impaired relaxation). Right Ventricle: The right ventricular size is normal. No increase in right ventricular wall thickness. Right ventricular systolic function is normal. Tricuspid regurgitation signal is inadequate for assessing PA pressure. The tricuspid regurgitant velocity is 2.52 m/s, and with an assumed right atrial pressure of 8 mmHg, the estimated right ventricular systolic pressure is 35.4 mmHg. Left Atrium: Left  atrial size was moderately dilated. Right Atrium: Right atrial size was moderately dilated. Pericardium: There is no evidence of pericardial effusion. Mitral Valve: The mitral valve is normal in structure. Mild mitral annular calcification. No evidence of mitral valve regurgitation. No evidence of mitral valve stenosis. Tricuspid Valve: The tricuspid valve is normal in structure. Tricuspid valve regurgitation is mild . No evidence of tricuspid stenosis. Aortic Valve: The aortic valve is tricuspid. Aortic valve regurgitation is not visualized. No aortic stenosis is present. Aortic valve mean gradient measures 3.0 mmHg. Aortic valve peak gradient measures 5.0 mmHg. Aortic valve area, by VTI measures 1.52 cm. Pulmonic Valve: The pulmonic valve was normal in structure. Pulmonic valve regurgitation is not visualized. No evidence of pulmonic stenosis. Aorta: Aortic dilatation noted. There is mild dilatation of the aortic root, measuring 42 mm. Venous: The inferior vena cava is dilated in size with greater than 50% respiratory variability, suggesting right atrial pressure of 8 mmHg. IAS/Shunts: There is left bowing of the interatrial septum, suggestive of elevated right atrial pressure. The interatrial septum was not well visualized. Additional Comments: A device lead is visualized.  LEFT VENTRICLE PLAX 2D LVIDd:         3.50 cm     Diastology LVIDs:         2.30 cm     LV e'  medial:    6.20 cm/s LV PW:         1.00 cm     LV E/e' medial:  9.3 LV IVS:        1.60 cm     LV e' lateral:   8.05 cm/s LVOT diam:     1.90 cm     LV E/e' lateral: 7.2 LV SV:         39 LV SV Index:   21 LVOT Area:     2.84 cm  LV Volumes (MOD) LV vol d, MOD A4C: 40.5 ml LV vol s, MOD A4C: 13.4 ml LV SV MOD A4C:     40.5 ml RIGHT VENTRICLE            IVC RV S prime:     9.03 cm/s  IVC diam: 2.70 cm TAPSE (M-mode): 1.4 cm LEFT ATRIUM           Index       RIGHT ATRIUM           Index LA diam:      3.80 cm 2.05 cm/m  RA Area:     17.00 cm LA Vol  (A2C): 50.1 ml 27.04 ml/m RA Volume:   40.20 ml  21.70 ml/m LA Vol (A4C): 50.7 ml 27.37 ml/m  AORTIC VALVE AV Area (Vmax):    1.70 cm AV Area (Vmean):   1.69 cm AV Area (VTI):     1.52 cm AV Vmax:           112.00 cm/s AV Vmean:          84.300 cm/s AV VTI:            0.255 m AV Peak Grad:      5.0 mmHg AV Mean Grad:      3.0 mmHg LVOT Vmax:         67.20 cm/s LVOT Vmean:        50.300 cm/s LVOT VTI:          0.137 m LVOT/AV VTI ratio: 0.54  AORTA Ao Root diam: 3.70 cm Ao Asc diam:  4.20 cm MITRAL VALVE               TRICUSPID VALVE MV Area (PHT): 3.44 cm    TR Peak grad:   25.4 mmHg MV E velocity: 57.80 cm/s  TR Vmax:        252.00 cm/s MV A velocity: 53.10 cm/s MV E/A ratio:  1.09        SHUNTS                            Systemic VTI:  0.14 m                            Systemic Diam: 1.90 cm Kirk Ruths MD Electronically signed by Kirk Ruths MD Signature Date/Time: 04/13/2021/1:50:53 PM    Final    IR THORACENTESIS ASP PLEURAL SPACE W/IMG GUIDE  Result Date: 04/10/2021 INDICATION: Patient with a history of acute on chronic heart failure and small cell lung cancer presents today with bilateral pleural effusion. Interventional Radiology asked to perform a therapeutic and diagnostic thoracentesis. EXAM: ULTRASOUND GUIDED THORACENTESIS MEDICATIONS: 1% lidocaine 10 mL COMPLICATIONS: None immediate. PROCEDURE: An ultrasound guided thoracentesis was thoroughly discussed with the patient and questions answered. The benefits, risks, alternatives and complications were also discussed. The patient understands  and wishes to proceed with the procedure. Written consent was obtained. Ultrasound was performed to localize and mark an adequate pocket of fluid in the right chest. The area was then prepped and draped in the normal sterile fashion. 1% Lidocaine was used for local anesthesia. Under ultrasound guidance a 6 Fr Safe-T-Centesis catheter was introduced. Thoracentesis was performed. The catheter was removed  and a dressing applied. FINDINGS: A total of approximately 650 mL of amber-colored fluid was removed. Samples were sent to the laboratory as requested by the clinical team. IMPRESSION: Successful ultrasound guided right thoracentesis yielding 650 mL of pleural fluid. Read by: Soyla Dryer, NP Electronically Signed   By: Ruthann Cancer M.D.   On: 04/10/2021 11:32    Assessment/Plan 1. Acute respiratory failure with hypoxia ([name]) Worsening with increased oxygen requirements Seen by pulmonary with no further treatment found to be beneficial ?due to radiation Hospice ordered.  - morphine (ROXANOL) 20 MG/ML concentrated solution; Take 0.25 mLs (5 mg total) by mouth every 4 (four) hours as needed for severe pain or shortness of breath.  Dispense: 30 mL; Refill: 0  2. Acute on chronic diastolic CHF (congestive heart failure) ([name]) Off torsemide due to comfort care goals and lack of benefit D/c daily wts due to resp distress noted with this measure  3. Slow transit constipation Continue miralax qod  4. Small cell lung cancer, right lower lobe ([name]) S/p resection, chemo, radiation  5. Nasal dryness Ayr nasal gel q 4 prn to both nares    Family/ staff Communication: discussed with resident   Labs/tests ordered:  NA

## 2021-05-05 ENCOUNTER — Telehealth: Payer: Self-pay | Admitting: Pulmonary Disease

## 2021-05-08 NOTE — Telephone Encounter (Signed)
Condolence card obtained and sent around office. Will send in mail today.   Nothing further needed at this time.

## 2021-05-09 ENCOUNTER — Ambulatory Visit (HOSPITAL_COMMUNITY): Payer: Medicare Other

## 2021-05-09 LAB — BRAIN NATRIURETIC PEPTIDE: B Natriuretic Peptide: 481

## 2021-05-12 ENCOUNTER — Ambulatory Visit: Payer: Medicare Other | Admitting: Radiation Oncology

## 2021-05-12 ENCOUNTER — Inpatient Hospital Stay: Payer: Medicare Other

## 2021-05-16 ENCOUNTER — Ambulatory Visit: Payer: Medicare Other | Admitting: Pulmonary Disease

## 2021-06-03 NOTE — Telephone Encounter (Signed)
Patient's son Gwyndolyn Saxon wanted Dr. Silas Flood to know that Patient passed away.

## 2021-06-03 DEATH — deceased

## 2021-06-10 ENCOUNTER — Ambulatory Visit (HOSPITAL_COMMUNITY): Payer: Medicare Other

## 2021-06-10 ENCOUNTER — Other Ambulatory Visit: Payer: Medicare Other

## 2021-06-12 ENCOUNTER — Ambulatory Visit: Payer: Medicare Other | Admitting: Internal Medicine

## 2023-06-24 NOTE — Telephone Encounter (Signed)
Telephone call
